# Patient Record
Sex: Male | Born: 1950
Health system: Southern US, Community
[De-identification: ages and names within clinical notes are randomized; demographics above are authoritative.]

## PROBLEM LIST (undated history)

## (undated) DIAGNOSIS — I2510 Atherosclerotic heart disease of native coronary artery without angina pectoris: Secondary | ICD-10-CM

## (undated) DIAGNOSIS — E039 Hypothyroidism, unspecified: Secondary | ICD-10-CM

## (undated) DIAGNOSIS — I241 Dressler's syndrome: Secondary | ICD-10-CM

## (undated) DIAGNOSIS — I10 Essential (primary) hypertension: Secondary | ICD-10-CM

## (undated) DIAGNOSIS — E785 Hyperlipidemia, unspecified: Secondary | ICD-10-CM

## (undated) DIAGNOSIS — I639 Cerebral infarction, unspecified: Secondary | ICD-10-CM

## (undated) HISTORY — DX: Atherosclerotic heart disease of native coronary artery without angina pectoris: I25.10

## (undated) HISTORY — DX: Hyperlipidemia, unspecified: E78.5

## (undated) HISTORY — DX: Dressler's syndrome: I24.1

## (undated) HISTORY — DX: Essential (primary) hypertension: I10

## (undated) HISTORY — PX: CORONARY ANGIOPLASTY WITH STENT PLACEMENT: SHX49

---

## 2002-07-21 ENCOUNTER — Emergency Department (HOSPITAL_COMMUNITY): Admission: EM | Admit: 2002-07-21 | Discharge: 2002-07-21 | Payer: Self-pay | Admitting: Emergency Medicine

## 2002-07-21 ENCOUNTER — Encounter: Payer: Self-pay | Admitting: Emergency Medicine

## 2009-03-15 DIAGNOSIS — I219 Acute myocardial infarction, unspecified: Secondary | ICD-10-CM

## 2009-03-15 HISTORY — DX: Acute myocardial infarction, unspecified: I21.9

## 2009-03-15 HISTORY — PX: CORONARY ANGIOPLASTY WITH STENT PLACEMENT: SHX49

## 2009-03-20 ENCOUNTER — Encounter: Payer: Self-pay | Admitting: Cardiology

## 2009-05-08 ENCOUNTER — Ambulatory Visit (HOSPITAL_COMMUNITY): Admission: RE | Admit: 2009-05-08 | Discharge: 2009-05-08 | Payer: Self-pay | Admitting: Urology

## 2009-08-29 ENCOUNTER — Inpatient Hospital Stay (HOSPITAL_COMMUNITY): Admission: EM | Admit: 2009-08-29 | Discharge: 2009-08-31 | Payer: Self-pay | Admitting: Cardiology

## 2009-08-29 ENCOUNTER — Ambulatory Visit: Payer: Self-pay | Admitting: Cardiology

## 2009-08-29 ENCOUNTER — Ambulatory Visit: Payer: Self-pay | Admitting: Cardiovascular Disease

## 2009-08-29 DIAGNOSIS — I241 Dressler's syndrome: Secondary | ICD-10-CM

## 2009-08-29 HISTORY — DX: Dressler's syndrome: I24.1

## 2009-08-30 ENCOUNTER — Encounter: Payer: Self-pay | Admitting: Cardiology

## 2009-09-09 ENCOUNTER — Encounter: Payer: Self-pay | Admitting: Cardiology

## 2009-09-29 ENCOUNTER — Encounter: Payer: Self-pay | Admitting: Cardiology

## 2009-09-30 ENCOUNTER — Encounter: Payer: Self-pay | Admitting: Cardiology

## 2009-10-02 DIAGNOSIS — E1159 Type 2 diabetes mellitus with other circulatory complications: Secondary | ICD-10-CM | POA: Insufficient documentation

## 2009-10-02 DIAGNOSIS — I10 Essential (primary) hypertension: Secondary | ICD-10-CM | POA: Insufficient documentation

## 2009-10-02 DIAGNOSIS — I2510 Atherosclerotic heart disease of native coronary artery without angina pectoris: Secondary | ICD-10-CM

## 2009-10-02 DIAGNOSIS — E785 Hyperlipidemia, unspecified: Secondary | ICD-10-CM

## 2009-10-02 DIAGNOSIS — R079 Chest pain, unspecified: Secondary | ICD-10-CM | POA: Insufficient documentation

## 2009-10-02 DIAGNOSIS — I214 Non-ST elevation (NSTEMI) myocardial infarction: Secondary | ICD-10-CM | POA: Insufficient documentation

## 2009-10-03 ENCOUNTER — Ambulatory Visit: Payer: Self-pay | Admitting: Cardiology

## 2009-10-03 ENCOUNTER — Encounter (INDEPENDENT_AMBULATORY_CARE_PROVIDER_SITE_OTHER): Payer: Self-pay | Admitting: *Deleted

## 2009-11-12 ENCOUNTER — Encounter: Payer: Self-pay | Admitting: Cardiology

## 2009-12-23 ENCOUNTER — Encounter: Payer: Self-pay | Admitting: Cardiology

## 2010-01-29 ENCOUNTER — Encounter: Payer: Self-pay | Admitting: Cardiology

## 2010-03-04 ENCOUNTER — Encounter: Payer: Self-pay | Admitting: Cardiology

## 2010-04-13 ENCOUNTER — Encounter: Payer: Self-pay | Admitting: Cardiology

## 2010-04-14 ENCOUNTER — Ambulatory Visit
Admission: RE | Admit: 2010-04-14 | Discharge: 2010-04-14 | Payer: Self-pay | Source: Home / Self Care | Attending: Cardiology | Admitting: Cardiology

## 2010-04-14 DIAGNOSIS — N529 Male erectile dysfunction, unspecified: Secondary | ICD-10-CM | POA: Insufficient documentation

## 2010-04-14 NOTE — Miscellaneous (Signed)
Summary: Rehab Report/ CARDIAC REHAB PROGRESS REPORT  Rehab Report/ CARDIAC REHAB PROGRESS REPORT   Imported By: Dorise Hiss 12/26/2009 12:24:21  _____________________________________________________________________  External Attachment:    Type:   Image     Comment:   External Document

## 2010-04-14 NOTE — Consult Note (Signed)
Summary: ER CARDIOLOGY CONSULT/ [facility]  ER CARDIOLOGY CONSULT/ [facility]   Imported By: Zachary George 10/02/2009 14:23:08  _____________________________________________________________________  External Attachment:    Type:   Image     Comment:   External Document

## 2010-04-14 NOTE — Miscellaneous (Signed)
Summary: Rehab Report/ CARDIAC REHAB PROGRESS REPORT  Rehab Report/ CARDIAC REHAB PROGRESS REPORT   Imported By: Dorise Hiss 11/13/2009 08:24:39  _____________________________________________________________________  External Attachment:    Type:   Image     Comment:   External Document

## 2010-04-14 NOTE — Miscellaneous (Signed)
Summary: Rehab Report/ CARDIAC REHAB PLAN OF CARE  Rehab Report/ CARDIAC [facility] PLAN OF CARE   Imported By: Dorise Hiss 10/02/2009 14:31:58  _____________________________________________________________________  External Attachment:    Type:   Image     Comment:   External Document

## 2010-04-14 NOTE — Miscellaneous (Signed)
Summary: Rehab Report/ FAXED CARDIAC REHAB REFERRAL  Rehab Report/ FAXED CARDIAC REHAB REFERRAL   Imported By: Dorise Hiss 09/09/2009 08:40:53  _____________________________________________________________________  External Attachment:    Type:   Image     Comment:   External Document

## 2010-04-14 NOTE — Assessment & Plan Note (Signed)
Summary: [facility] 6/19 3-4 WK   Visit Type:  hospital follow-up Primary Provider:  Youlanda Roys   History of Present Illness: patient is 60 year old male with a non-ST elevation elevation myocardial infarctioncurrent. The patient has multiple cardiac risk factors including diabetes mellitus hypertension and hypercholesterolemia. The patient underwent drug-eluting stent placement to the mid LAD. He has been doing well. He reports no chest pain or shortness of breath has had no orthopnea or PND. He has difficulty affording Crestor. He is also taking prasugrel. The patient is going to cardiac rehabilitation and is doing quite well.  Preventive Screening-Counseling & Management  Alcohol-Tobacco     Smoking Status: quit     Year Quit: 1990  Current Medications (verified): 1)  Aspir-Trin 325 Mg Tbec (Aspirin) .... Take 1 Tablet By Mouth Once A Day 2)  Glipizide Xl 10 Mg Xr24h-Tab (Glipizide) .... Take 1 Tablet By Mouth Two Times A Day 3)  Metoprolol Tartrate 25 Mg Tabs (Metoprolol Tartrate) .... Take 1 Tablet By Mouth Two Times A Day 4)  Nitrostat 0.4 Mg Subl (Nitroglycerin) .... Use As Directed 5)  Effient 10 Mg Tabs (Prasugrel Hcl) .... Take 1 Tablet By Mouth Once A Day 6)  Pravastatin Sodium 80 Mg Tabs (Pravastatin Sodium) .... Take 1 Tab By Mouth At Bedtime 7)  Amlodipine Besylate 10 Mg Tabs (Amlodipine Besylate) .... Take 1 Tablet By Mouth Once A Day 8)  Benazepril Hcl 40 Mg Tabs (Benazepril Hcl) .... Take 2 Tablet By Mouth Once A Day 9)  Lantus 100 Unit/ml Soln (Insulin Glargine) .... Use As Directed 10)  Metformin Hcl 1000 Mg Tabs (Metformin Hcl) .... Take 1 Tablet By Mouth Two Times A Day  Allergies (verified): No Known Drug Allergies  Comments:  Nurse/Medical Assistant: The patient's medication list and allergies were reviewed with the patient and were updated in the Medication and Allergy Lists.  Past History:  Past Medical History: Last updated:  10/02/2009 Insulin-dependent DM Hypertension Dyslipidemia Chest pains Post MI 08-29-2009 CAD  Family History: Last updated: 10/02/2009 Hypertension in family members  Social History: Last updated: 10/02/2009 Works at a print shop Alcohol Use - no Drug Use - no Married   Risk Factors: Smoking Status: quit (10/03/2009)  Social History: Smoking Status:  quit  Vital Signs:  Patient profile:   60 year old male Height:      74 inches Weight:      229 pounds BMI:     29.51 Pulse rate:   76 / minute BP sitting:   137 / 90  (left arm) Cuff size:   large  Vitals Entered By: Carlye Grippe (October 03, 2009 9:27 AM)  Nutrition Counseling: Patient's BMI is greater than 25 and therefore counseled on weight management options.  Physical Exam  Additional Exam:  General: Well-developed, well-nourished in no distress head: Normocephalic and atraumatic eyes PERRLA/EOMI intact, conjunctiva and lids normal nose: No deformity or lesions mouth normal dentition, normal posterior pharynx neck: Supple, no JVD.  No masses, thyromegaly or abnormal cervical nodes lungs: Normal breath sounds bilaterally without wheezing.  Normal percussion heart: regular rate and rhythm with normal S1 and S2, no S3 or S4.  PMI is normal.  No pathological murmurs abdomen: Normal bowel sounds, abdomen is soft and nontender without masses, organomegaly or hernias noted.  No hepatosplenomegaly musculoskeletal: Back normal, normal gait muscle strength and tone normal pulsus: Pulse is normal in all 4 extremities Extremities: No peripheral pitting edema neurologic: Alert and oriented x 3 skin: Intact  without lesions or rashes cervical nodes: No significant adenopathy psychologic: Normal affect    Impression & Recommendations:  Problem # 1:  CORONARY ATHEROSCLEROSIS NATIVE CORONARY ARTERY (ICD-414.01) no recurrent chest pain.. Continue medical therapy His updated medication list for this problem includes:     Aspir-trin 325 Mg Tbec (Aspirin) .Marland Kitchen... Take 1 tablet by mouth once a day    Metoprolol Tartrate 25 Mg Tabs (Metoprolol tartrate) .Marland Kitchen... Take 1 tablet by mouth two times a day    Nitrostat 0.4 Mg Subl (Nitroglycerin) ..... Use as directed    Effient 10 Mg Tabs (Prasugrel hcl) .Marland Kitchen... Take 1 tablet by mouth once a day    Amlodipine Besylate 10 Mg Tabs (Amlodipine besylate) .Marland Kitchen... Take 1 tablet by mouth once a day    Benazepril Hcl 40 Mg Tabs (Benazepril hcl) .Marland Kitchen... Take 2 tablet by mouth once a day  Problem # 2:  DYSLIPIDEMIA (ICD-272.4) we'll discontinue pressor and changed to high-dose pravastatin. His updated medication list for this problem includes:    Pravastatin Sodium 80 Mg Tabs (Pravastatin sodium) .Marland Kitchen... Take 1 tab by mouth at bedtime  Problem # 3:  HYPERTENSION (ICD-401.9) blood pressure well-controlled. His updated medication list for this problem includes:    Aspir-trin 325 Mg Tbec (Aspirin) .Marland Kitchen... Take 1 tablet by mouth once a day    Metoprolol Tartrate 25 Mg Tabs (Metoprolol tartrate) .Marland Kitchen... Take 1 tablet by mouth two times a day    Amlodipine Besylate 10 Mg Tabs (Amlodipine besylate) .Marland Kitchen... Take 1 tablet by mouth once a day    Benazepril Hcl 40 Mg Tabs (Benazepril hcl) .Marland Kitchen... Take 2 tablet by mouth once a day  Patient Instructions: 1)  Stop Crestor 2)  Begin Pravastatin 80mg  at bedtime  3)  Follow up in  6 months Prescriptions: PRAVASTATIN SODIUM 80 MG TABS (PRAVASTATIN SODIUM) Take 1 tab by mouth at bedtime  #30 x 6   Entered by:   Hoover Brunette, LPN   Authorized by:   Lewayne Bunting, MD, [facility]   Signed by:   Hoover Brunette, LPN on 16/12/9602   Method used:   [facility]  [address] [address] [id number]       [address]       [place], [place]  [address]       Ph: [phone] or [phone]       Fax: [phone]   RxID:   [id number]

## 2010-04-14 NOTE — Miscellaneous (Signed)
Summary: Rehab Report/ CARDIAC REHAB PROGRESS REPORT  Rehab Report/ CARDIAC REHAB PROGRESS REPORT   Imported By: Dorise Hiss 01/30/2010 15:29:58  _____________________________________________________________________  External Attachment:    Type:   Image     Comment:   External Document

## 2010-04-14 NOTE — Letter (Signed)
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 [address]   [place], [place] [address]   Phone: [phone]  Fax: [phone]    10/03/2009  TO: Leodis Sias IT MAY CONCERN   RE: CHUKWUKA FESTA [facility] [address] [id number]   The above named individual is under my medical care and may return to work on:  Monday, October 06, 2009.  If you have any further questions or need additional information, please call.     Sincerely,    Hoover Brunette, LPN

## 2010-04-14 NOTE — Miscellaneous (Signed)
Summary: Rehab Report/ CARDIAC REHAB PROGRESS REPORT  Rehab Report/ CARDIAC REHAB PROGRESS REPORT   Imported By: Dorise Hiss 09/30/2009 15:49:31  _____________________________________________________________________  External Attachment:    Type:   Image     Comment:   External Document

## 2010-04-16 NOTE — Miscellaneous (Signed)
Summary: Rehab Report/ CARDIAC REHAB PROGRESS REPORT  Rehab Report/ CARDIAC REHAB PROGRESS REPORT   Imported By: Dorise Hiss 03/04/2010 14:05:38  _____________________________________________________________________  External Attachment:    Type:   Image     Comment:   External Document

## 2010-04-22 NOTE — Miscellaneous (Signed)
Summary: Rehab Report/ CARDIAC REHAB DISCHARGE SUMMARY  Rehab Report/ CARDIAC REHAB DISCHARGE SUMMARY   Imported By: Dorise Hiss 04/14/2010 14:31:43  _____________________________________________________________________  External Attachment:    Type:   Image     Comment:   External Document

## 2010-04-30 NOTE — Assessment & Plan Note (Signed)
Summary: 6 MO FUL   Visit Type:  Follow-up Primary Provider:  Viviann Spare Burdine,MD   History of Present Illness: the patient is a 60 year old male with history of coronary artery disease status post prior stenting to the LAD.  The patient denies any chest pain or shortness of breath.  He is no orthopnea PND.from a cardiovascular perspective  he is doing quite well.  He is compliant with his medical regimen.  The patient is complaint of erectile dysfunction after he has been placed on beta-blocker therapy.  Is requesting some therapy.  Of note is also the patient's diastolic blood pressures elevated at 88 mm of mercury.  The patient has just finished cardiac rehab program.  He states that he will continue to do exercise as much as he can.  His lipid panels followed by his primary care physician.  The patient also has diabetes which is somewhat difficult to control and is both on insulin and oral hypoglycemics.  The patient remains at high risk for coronary artery disease and advised him to continue prasugrel at least for a year if not longer.  Preventive Screening-Counseling & Management  Alcohol-Tobacco     Smoking Status: quit     Year Quit: 1990  Current Medications (verified): 1)  Aspir-Trin 325 Mg Tbec (Aspirin) .... Take 1 Tablet By Mouth Once A Day 2)  Glipizide Xl 10 Mg Xr24h-Tab (Glipizide) .... Take 1 Tablet By Mouth Two Times A Day 3)  Metoprolol Tartrate 25 Mg Tabs (Metoprolol Tartrate) .... Take 1 Tablet By Mouth Two Times A Day 4)  Nitrostat 0.4 Mg Subl (Nitroglycerin) .... Use As Directed 5)  Effient 10 Mg Tabs (Prasugrel Hcl) .... Take 1 Tablet By Mouth Once A Day 6)  Pravastatin Sodium 80 Mg Tabs (Pravastatin Sodium) .... Take 1 Tab By Mouth At Bedtime 7)  Amlodipine Besylate 10 Mg Tabs (Amlodipine Besylate) .... Take 1 Tablet By Mouth Once A Day 8)  Benazepril Hcl 40 Mg Tabs (Benazepril Hcl) .... Take 2 Tablet By Mouth Once A Day 9)  Novolin 70/30 70-30 % Susp (Insulin  Isophane & Regular) .... Use As Directed 10)  Metformin Hcl 1000 Mg Tabs (Metformin Hcl) .... Take 1 Tablet By Mouth Two Times A Day 11)  Viagra 25 Mg Tabs (Sildenafil Citrate) .... May Take One Tab As Needed (Max One Per Day) Take 30-40 Minutes Prior To Intercourse, May Increase To 50mg  If Needed 12)  Chlorthalidone 25 Mg Tabs (Chlorthalidone) .... Take 1/2 Tab (12.5mg ) Daily  Allergies (verified): No Known Drug Allergies  Comments:  Nurse/Medical Assistant: The patient's medication list and allergies were reviewed with the patient and were updated in the Medication and Allergy Lists.  Past History:  Family History: Last updated: 10/02/2009 Hypertension in family members  Social History: Last updated: 10/02/2009 Works at a print shop Alcohol Use - no Drug Use - no Married   Risk Factors: Smoking Status: quit (04/14/2010)  Past Medical History: Insulin-dependent DM Hypertension Dyslipidemia Chest pains Post MI 08-29-2009 CAD, status post drug-eluting stent to the LAD  Review of Systems       The patient complains of leg swelling.  The patient denies fatigue, malaise, fever, weight gain/loss, vision loss, decreased hearing, hoarseness, chest pain, palpitations, shortness of breath, prolonged cough, wheezing, sleep apnea, coughing up blood, abdominal pain, blood in stool, nausea, vomiting, diarrhea, heartburn, incontinence, blood in urine, muscle weakness, joint pain, rash, skin lesions, headache, fainting, dizziness, depression, anxiety, enlarged lymph nodes, easy bruising or bleeding, and environmental  allergies.         erectile dysfunction  Vital Signs:  Patient profile:   60 year old male Height:      74 inches Weight:      236 pounds Pulse rate:   85 / minute BP sitting:   138 / 88  (left arm) Cuff size:   large  Vitals Entered By: Carlye Grippe (April 14, 2010 9:35 AM)  Physical Exam  Additional Exam:  General: Well-developed, well-nourished in no  distress head: Normocephalic and atraumatic eyes PERRLA/EOMI intact, conjunctiva and lids normal nose: No deformity or lesions mouth normal dentition, normal posterior pharynx neck: Supple, no JVD.  No masses, thyromegaly or abnormal cervical nodes lungs: Normal breath sounds bilaterally without wheezing.  Normal percussion heart: regular rate and rhythm with normal S1 and S2, no S3 or S4.  PMI is normal.  No pathological murmurs abdomen: Normal bowel sounds, abdomen is soft and nontender without masses, organomegaly or hernias noted.  No hepatosplenomegaly musculoskeletal: Back normal, normal gait muscle strength and tone normal pulsus: Pulse is normal in all 4 extremities Extremities: 1+ peripheral pitting edema neurologic: Alert and oriented x 3 skin: Intact without lesions or rashes cervical nodes: No significant adenopathy psychologic: Normal affect    Impression & Recommendations:  Problem # 1:  CORONARY ATHEROSCLEROSIS NATIVE CORONARY ARTERY (ICD-414.01) the patient is status post stent placement to the LAD.  He has no recurrent chest pain.  Continue risk factor modification.  The patient will remain on prasugrel for at least a year if not longer.  He has no bleeding complications. His updated medication list for this problem includes:    Aspir-trin 325 Mg Tbec (Aspirin) .Marland Kitchen... Take 1 tablet by mouth once a day    Metoprolol Tartrate 25 Mg Tabs (Metoprolol tartrate) .Marland Kitchen... Take 1 tablet by mouth two times a day    Nitrostat 0.4 Mg Subl (Nitroglycerin) ..... Use as directed    Effient 10 Mg Tabs (Prasugrel hcl) .Marland Kitchen... Take 1 tablet by mouth once a day    Amlodipine Besylate 10 Mg Tabs (Amlodipine besylate) .Marland Kitchen... Take 1 tablet by mouth once a day    Benazepril Hcl 40 Mg Tabs (Benazepril hcl) .Marland Kitchen... Take 2 tablet by mouth once a day  Problem # 2:  HYPERTENSION (ICD-401.9) diastolic blood pressure appears poorly controlled.  He also has lower joint edema I have added for Tylenol 12.5  monos p.o. daily His updated medication list for this problem includes:    Aspir-trin 325 Mg Tbec (Aspirin) .Marland Kitchen... Take 1 tablet by mouth once a day    Metoprolol Tartrate 25 Mg Tabs (Metoprolol tartrate) .Marland Kitchen... Take 1 tablet by mouth two times a day    Amlodipine Besylate 10 Mg Tabs (Amlodipine besylate) .Marland Kitchen... Take 1 tablet by mouth once a day    Benazepril Hcl 40 Mg Tabs (Benazepril hcl) .Marland Kitchen... Take 2 tablet by mouth once a day    Chlorthalidone 25 Mg Tabs (Chlorthalidone) .Marland Kitchen... Take 1/2 tab (12.5mg ) daily  Problem # 3:  DYSLIPIDEMIA (ICD-272.4) followed by his primary care physician and the patient is now on high-dose statin.  Reportedly his LDL level is within goal His updated medication list for this problem includes:    Pravastatin Sodium 80 Mg Tabs (Pravastatin sodium) .Marland Kitchen... Take 1 tab by mouth at bedtime  Problem # 4:  DM (ICD-250.00) Assessment: Comment Only  His updated medication list for this problem includes:    Aspir-trin 325 Mg Tbec (Aspirin) .Marland Kitchen... Take 1 tablet by  mouth once a day    Glipizide Xl 10 Mg Xr24h-tab (Glipizide) .Marland Kitchen... Take 1 tablet by mouth two times a day    Benazepril Hcl 40 Mg Tabs (Benazepril hcl) .Marland Kitchen... Take 2 tablet by mouth once a day    Novolin 70/30 70-30 % Susp (Insulin isophane & regular) ..... Use as directed    Metformin Hcl 1000 Mg Tabs (Metformin hcl) .Marland Kitchen... Take 1 tablet by mouth two times a day  Problem # 5:  ERECTILE DYSFUNCTION, SECONDARY TO MEDICATION (WNU-272.53) likely erectile dysfunction secondary to metoprolol.  I told the patient of the benefit of the Toprol in reducing his future risk for recurrent myocardial infarction.  We discussed the possible benefits of Viagra and have given the patient prescription.  I started him clearly not to use this around the use of nitroglycerin.  Patient Instructions: 1)  Viagra 25mg  (max one per day) - 30 to 40 minutes prior to intercourse.  May increase to 50mg  x 1 if needed  2)  No Nitroglycerin when  using Viagra  3)  Chlorthalidone 12.5mg  daily 4)  Follow up in  6 months Prescriptions: CHLORTHALIDONE 25 MG TABS (CHLORTHALIDONE) take 1/2 tab (12.5mg ) daily  #15 x 6   Entered by:   Hoover Brunette, LPN   Authorized by:   Lewayne Bunting, MD, [facility]   Signed by:   Hoover Brunette, LPN on 66/44/0347   Method used:   [facility]  [address] [facility]       [address]       [place], [place]  [address]       Ph: [phone] or [phone]       Fax: [phone]   RxID:   [id number]   Handout requested. VIAGRA 25 MG TABS (SILDENAFIL CITRATE) may take one tab as needed (max one per day) Take 30-40 minutes prior to intercourse, may increase to 50mg  if needed  #30 x 1   Entered by:   Hoover Brunette, LPN   Authorized by:   Lewayne Bunting, MD, [facility]   Signed by:   Hoover Brunette, LPN on 23/76/2831   Method used:   [facility]  [address] [facility]       [address]       [place], [place]  [address]       Ph: [phone] or [phone]       Fax: [phone]   RxID:   [id number]

## 2010-05-31 LAB — BASIC METABOLIC PANEL
BUN: 10 mg/dL (ref 6–23)
CO2: 27 mEq/L (ref 19–32)
Calcium: 8.4 mg/dL (ref 8.4–10.5)
Creatinine, Ser: 0.95 mg/dL (ref 0.4–1.5)
Glucose, Bld: 227 mg/dL — ABNORMAL HIGH (ref 70–99)

## 2010-05-31 LAB — GLUCOSE, CAPILLARY
Glucose-Capillary: 203 mg/dL — ABNORMAL HIGH (ref 70–99)
Glucose-Capillary: 215 mg/dL — ABNORMAL HIGH (ref 70–99)
Glucose-Capillary: 251 mg/dL — ABNORMAL HIGH (ref 70–99)
Glucose-Capillary: 254 mg/dL — ABNORMAL HIGH (ref 70–99)
Glucose-Capillary: 266 mg/dL — ABNORMAL HIGH (ref 70–99)
Glucose-Capillary: 316 mg/dL — ABNORMAL HIGH (ref 70–99)

## 2010-05-31 LAB — CBC
MCHC: 32.9 g/dL (ref 30.0–36.0)
RDW: 13.3 % (ref 11.5–15.5)

## 2010-07-31 NOTE — Consult Note (Signed)
NAMERICHRD, KUZNIAR NO.:  [id number]   MEDICAL RECORD NO.:  [id number]                   PATIENT TYPE:  EMS   LOCATION:  ED                                   FACILITY:  [facility]   PHYSICIAN:  Artist Pais. Mina Marble, M.D.           DATE OF BIRTH:  [birth year]   DATE OF CONSULTATION:  DATE OF DISCHARGE:                                   CONSULTATION   PHYSICIAN REQUESTING CONSULTATION:  Dr. Donnetta Hutching.   REASON FOR CONSULTATION:  This is a very pleasant 60 year old right hand  dominant male who got his long finger caught in a press while at work today  and presents today with an obvious open fracture of the distal phalanx -  right long finger with nail plate fracture, nailbed laceration, etcetera.  He is 60 years old.   ALLERGIES:  He has no known drug allergies.   MEDICATIONS:  He is currently on oral antihyperglycemics for non-insulin-  dependent diabetes mellitus.   PAST MEDICAL HISTORY:  No other significant  past medical history.   SOCIAL HISTORY:  Noncontributory.   FAMILY HISTORY:  Noncontributory.   PHYSICAL EXAMINATION:  GENERAL:  A well-developed, well-nourished male,  pleasant, alert and oriented  x 3.  EXTREMITIES:  Examination reveals an obvious open injury to the right long  finger with a nail plate fracture, open distal phalanx fracture, nailbed  laceration.  He has sensation distally to the tip.   DIAGNOSTIC STUDIES:  X-rays show a transverse fracture with some mild  displacement to the distal third of the distal phalanx.   PROCEDURE:  The patient was given a 2% plain lidocaine digital sheath block,  once adequate anesthesia was obtained he was prepped and draped in he usual  sterile fashion.  His wound was thoroughly irrigated and debrided of clot  and nonviable material.  His skin was then sutured with 4-0 Nylon to realign  the tip and the nailbed was repaired with 6-0 Chromic.  He was then placed  in a sterile dressing  Xeroform, 4 x 4s, Coban wrap and a volar splint.   DISPOSITION:  Patient was discharged from the emergency department with  Spectracef 200 mg one p.o. b.i.d. for ten days for antibiotic prophylaxis.  Vicodin #20 for pain.  He is to followup in my office in 5-7 days.  I also  told him that because of the transverse nature of this fracture, this might  require pinning at one point in the future if it does not maintain its  alignment but grossly it looked aligned in the emergency room.                                                Artist Pais Mina Marble, M.D.   MAW/MEDQ  D:  07/21/2002  T:  07/21/2002  Job:  [id number]

## 2010-09-10 ENCOUNTER — Encounter: Payer: Self-pay | Admitting: Cardiology

## 2010-10-08 ENCOUNTER — Other Ambulatory Visit (HOSPITAL_COMMUNITY): Payer: Self-pay | Admitting: Urology

## 2010-10-08 ENCOUNTER — Ambulatory Visit (HOSPITAL_COMMUNITY)
Admission: RE | Admit: 2010-10-08 | Discharge: 2010-10-08 | Disposition: A | Discharge status: Home / Self Care | Payer: BC Managed Care – PPO | Source: Ambulatory Visit | Attending: Urology | Admitting: Urology

## 2010-10-08 DIAGNOSIS — N135 Crossing vessel and stricture of ureter without hydronephrosis: Secondary | ICD-10-CM

## 2010-11-10 ENCOUNTER — Telehealth: Payer: Self-pay | Admitting: Cardiology

## 2010-11-10 NOTE — Telephone Encounter (Signed)
Alex Cortez called wanting to know when he is suppose to come off of the Effient 10mg . States that he has been on it For one year and he thought Dr Andee Lineman had said to take for one year.

## 2010-11-11 NOTE — Telephone Encounter (Signed)
Message left on patient answering machine that we will send message to Dr. Andee Lineman for clarification.  Per last OV dictation (04-14-10), patient will remain on Prasugrel for at least a year if not longer.

## 2010-11-11 NOTE — Telephone Encounter (Signed)
Patient can discontinue prasurgrel a stent longer than one year which I think is the case. He is to continue full dose aspirin 325 minutes. Daily

## 2010-11-13 ENCOUNTER — Other Ambulatory Visit: Payer: Self-pay | Admitting: *Deleted

## 2010-11-13 MED ORDER — METOPROLOL TARTRATE 25 MG PO TABS: 25.0000 mg | ORAL_TABLET | Freq: Two times a day (BID) | ORAL | Status: DC

## 2010-11-13 NOTE — Telephone Encounter (Signed)
Left message to return call 

## 2010-11-17 NOTE — Telephone Encounter (Signed)
Patient notified via answering machine.   

## 2011-01-14 ENCOUNTER — Other Ambulatory Visit: Payer: Self-pay | Admitting: *Deleted

## 2011-01-14 MED ORDER — METOPROLOL TARTRATE 25 MG PO TABS: 25.0000 mg | ORAL_TABLET | Freq: Two times a day (BID) | ORAL | Status: DC

## 2011-02-16 ENCOUNTER — Other Ambulatory Visit: Payer: Self-pay | Admitting: *Deleted

## 2011-02-16 MED ORDER — METOPROLOL TARTRATE 25 MG PO TABS: 25.0000 mg | ORAL_TABLET | Freq: Two times a day (BID) | ORAL | Status: DC

## 2011-03-20 ENCOUNTER — Other Ambulatory Visit: Payer: Self-pay | Admitting: Cardiology

## 2011-04-08 ENCOUNTER — Other Ambulatory Visit: Payer: Self-pay | Admitting: *Deleted

## 2011-04-08 MED ORDER — METOPROLOL TARTRATE 25 MG PO TABS: 25.0000 mg | ORAL_TABLET | Freq: Two times a day (BID) | ORAL | Status: DC

## 2011-04-22 ENCOUNTER — Other Ambulatory Visit: Payer: Self-pay | Admitting: Cardiology

## 2011-05-05 ENCOUNTER — Other Ambulatory Visit: Payer: Self-pay | Admitting: Cardiology

## 2011-08-25 ENCOUNTER — Encounter: Payer: Self-pay | Admitting: Cardiology

## 2011-08-25 DIAGNOSIS — I2510 Atherosclerotic heart disease of native coronary artery without angina pectoris: Secondary | ICD-10-CM

## 2011-08-30 ENCOUNTER — Ambulatory Visit (INDEPENDENT_AMBULATORY_CARE_PROVIDER_SITE_OTHER): Payer: Federal, State, Local not specified - PPO | Admitting: Cardiology

## 2011-08-30 ENCOUNTER — Encounter: Payer: Self-pay | Admitting: *Deleted

## 2011-08-30 ENCOUNTER — Encounter: Payer: Self-pay | Admitting: Cardiology

## 2011-08-30 ENCOUNTER — Telehealth: Payer: Self-pay

## 2011-08-30 VITALS — BP 158/92 | HR 98 | Ht 74.0 in | Wt 253.0 lb

## 2011-08-30 DIAGNOSIS — G4733 Obstructive sleep apnea (adult) (pediatric): Secondary | ICD-10-CM

## 2011-08-30 DIAGNOSIS — N529 Male erectile dysfunction, unspecified: Secondary | ICD-10-CM

## 2011-08-30 DIAGNOSIS — R072 Precordial pain: Secondary | ICD-10-CM

## 2011-08-30 DIAGNOSIS — R0609 Other forms of dyspnea: Secondary | ICD-10-CM

## 2011-08-30 DIAGNOSIS — R079 Chest pain, unspecified: Secondary | ICD-10-CM

## 2011-08-30 DIAGNOSIS — Z01810 Encounter for preprocedural cardiovascular examination: Secondary | ICD-10-CM

## 2011-08-30 DIAGNOSIS — I519 Heart disease, unspecified: Secondary | ICD-10-CM

## 2011-08-30 DIAGNOSIS — R0600 Dyspnea, unspecified: Secondary | ICD-10-CM

## 2011-08-30 DIAGNOSIS — I214 Non-ST elevation (NSTEMI) myocardial infarction: Secondary | ICD-10-CM

## 2011-08-30 DIAGNOSIS — I2510 Atherosclerotic heart disease of native coronary artery without angina pectoris: Secondary | ICD-10-CM

## 2011-08-30 DIAGNOSIS — R0602 Shortness of breath: Secondary | ICD-10-CM

## 2011-08-30 LAB — PROTIME-INR

## 2011-08-30 MED ORDER — CARVEDILOL 3.125 MG PO TABS: 3.1250 mg | ORAL_TABLET | Freq: Two times a day (BID) | ORAL | Status: DC

## 2011-08-30 MED ORDER — ISOSORBIDE MONONITRATE ER 30 MG PO TB24: 30.0000 mg | ORAL_TABLET | Freq: Every day | ORAL | Status: DC

## 2011-08-30 MED ORDER — FUROSEMIDE 40 MG PO TABS: 40.0000 mg | ORAL_TABLET | Freq: Every day | ORAL | Status: DC

## 2011-08-30 MED ORDER — NITROGLYCERIN 0.4 MG SL SUBL: 0.4000 mg | SUBLINGUAL_TABLET | SUBLINGUAL | Status: DC | PRN

## 2011-08-30 NOTE — Patient Instructions (Addendum)
   Left & right heart cath   Begin Coreg 3.125mg  twice a day    Begin Imdur 30mg  daily  Begin Lasix 40mg  daily  Begin Nitroglycerin SL as needed for severe chest pain only  Referral to Dr. Josephina Shih  Follow up will be given after procedure

## 2011-08-30 NOTE — Telephone Encounter (Signed)
Left & right heart cath  Friday 6/21 Dr. Elease Hashimoto Checking percert

## 2011-08-30 NOTE — Telephone Encounter (Signed)
No precert required for [facility]  /AW

## 2011-08-31 ENCOUNTER — Encounter: Payer: Self-pay | Admitting: Cardiology

## 2011-09-02 ENCOUNTER — Other Ambulatory Visit: Payer: Self-pay | Admitting: Cardiology

## 2011-09-02 DIAGNOSIS — I519 Heart disease, unspecified: Secondary | ICD-10-CM | POA: Insufficient documentation

## 2011-09-02 DIAGNOSIS — I50810 Right heart failure, unspecified: Secondary | ICD-10-CM

## 2011-09-02 DIAGNOSIS — G4733 Obstructive sleep apnea (adult) (pediatric): Secondary | ICD-10-CM | POA: Insufficient documentation

## 2011-09-02 NOTE — Assessment & Plan Note (Signed)
Status post prior myocardial infarction and stent to the LAD. The patient was previously on prasugrel. Currently not on dual antiplatelet therapy anymore.

## 2011-09-02 NOTE — H&P (Signed)
Alex Bottoms, MD 09/02/2011 6:53 PM Signed  Alex Bottoms, MD, [facility]  ABIM Board Certified in Adult Cardiovascular Medicine,Internal Medicine and Critical Care Medicine  CC: New-onset congestive heart failure with dyspnea and abnormal Myoview study  HPI:  The patient is 61 year old male with a prior history of non-ST elevation microinfarction and stent to the LAD.  The patient has not been seen in a while in his office. Over the last 8 months or so the patient has developed increased shortness of breath on exertion. This has been associated with significant weight gain of approximately 20 pounds over that period  Earlier in the year the patient was at the beach and experienced several episodes of dyspnea on minimal exertion with diaphoresis. In the course of this last 8 months is also sinus primary care physician who initiated workup. Chest x-ray and EKG were done and were felt to be within normal limits. Recently the patient was set up for a stress test which was abnormal with evidence of ischemia involving the anterolateral wall as well as a scar in the inferior apical wall.  The patient also has noticed increased edema in the lower extremities. Particularly over the last several weeks he has dyspnea on minimal exertion. One flight of stairs is difficult for him. He denies any substernal chest pain with the patient is a diabetic. Previously also had variable chest pain associated with his non-ST elevation myocardial infarction.  At present blood pressure is also poorly controlled. According to the patient's family the patient snores a lot at night with apneic episodes. He has morning headaches and falls asleep easily during the daytime. He has not had a prior workup for obstructive sleep apnea.  The patient also reports orthopnea and PND. He denies any palpitations presyncope or syncope.  PMH: reviewed and listed in Problem List in Electronic Records (and see below)  Past Medical History   Diagnosis   Date   .  Diabetes mellitus      insulin-dependent   .  HTN (hypertension)    .  Dyslipidemia    .  Chest pain    .  Post MI syndrome  08-29-2009   .  CAD (coronary artery disease)      status post drug eluting stent to the LAd    No past surgical history on file.  Allergies/SH/FHX : available in Electronic Records for review  No Known Allergies  History    Social History   .  Marital Status:  Married     Spouse Name:  N/A     Number of Children:  N/A   .  Years of Education:  N/A    Occupational History   .  Not on file.    Social History Main Topics   .  Smoking status:  Former Smoker -- 0.5 packs/day for 23 years     Types:  Cigarettes     Quit date:  03/15/1988   .  Smokeless tobacco:  Never Used   .  Alcohol Use:  No   .  Drug Use:  No   .  Sexually Active:  Not on file    Other Topics  Concern   .  Not on file    Social History Narrative    Works at [employer]. Married.    Family History   Problem  Relation  Age of Onset   .  Hypertension  Other       in all family members  Medications:  Current Outpatient Prescriptions   Medication  Sig  Dispense  Refill   .  amLODipine (NORVASC) 10 MG tablet  Take 10 mg by mouth daily.     Marland Kitchen  aspirin (ASPIR-TRIN) 325 MG EC tablet  Take 325 mg by mouth daily.     .  benazepril (LOTENSIN) 40 MG tablet  Take 40 mg by mouth daily.     Marland Kitchen  glipiZIDE (GLIPIZIDE XL) 10 MG 24 hr tablet  Take 10 mg by mouth 2 (two) times daily.     .  insulin NPH-insulin regular (NOVOLIN 70/30) (70-30) 100 UNIT/ML injection  Inject into the skin as directed.     .  metFORMIN (GLUMETZA) 1000 MG (MOD) 24 hr tablet  Take 1,000 mg by mouth 2 (two) times daily.     .  nitroGLYCERIN (NITROSTAT) 0.4 MG SL tablet  Place 1 tablet (0.4 mg total) under the tongue every 5 (five) minutes as needed.  25 tablet  3   .  pravastatin (PRAVACHOL) 80 MG tablet  Take 80 mg by mouth at bedtime.     .  carvedilol (COREG) 3.125 MG tablet  Take 1 tablet (3.125 mg total)  by mouth 2 (two) times daily.  60 tablet  6   .  furosemide (LASIX) 40 MG tablet  Take 1 tablet (40 mg total) by mouth daily.  30 tablet  6   .  isosorbide mononitrate (IMDUR) 30 MG 24 hr tablet  Take 1 tablet (30 mg total) by mouth daily.  30 tablet  6    ROS: No nausea or vomiting. No fever or chills.No melena or hematochezia.No bleeding.No claudication  Physical Exam:  BP 158/92  Pulse 98  Ht 6\' 2"  (1.88 m)  Wt 253 lb (114.76 kg)  BMI 32.48 kg/m2  SpO2 97%  General: Overweight African American male with mild shortness of breath.  HEENT: Pupils isocoric conjunctiva clear. Oropharynx clear  Neck: Normal carotid upstroke no carotid bruits. No thyromegaly nonnodular thyroid. JVP difficult to evaluate but appears to be around 9-10 cm  Lungs: Diminished breath sounds with few crackles at the bases.  Cardiac: Regular rate and rhythm with normal S1-S2, S4 present no definite S3 and no pathological murmurs  Vascular: 3+ peripheral pitting edema bilaterally. Normal distal pulses including dorsalis pedis and posterior tibial pulses  Skin: Warm and dry  Physcologic: Normal affect  Neurologic: Alert and oriented and grossly nonfocal  12lead ECG:  Limited bedside ECHO:N/A  No images are attached to the encounter.  Assessment and Plan  CORONARY ATHEROSCLEROSIS NATIVE CORONARY ARTERY  Status post prior stent to the LAD. Now patient presents with new-onset heart failure as well as an abnormal Myoview study. Findings consistent with scar in the inferior apical wall and ischemia involving the anterolateral wall. Post stress LVF was 39%. The patient also reports significant shortness of breath on minimal exertion. Although he has very little chest pain he is a diabetic. Given his clinical presentation and abnormal Myoview study I recommended a cardiac catheterization.  In the interim I start the patient on carvedilol 3.125 mg by mouth twice a day, isosorbide mononitrate 30 mg by mouth daily, Lasix 40 mg by  mouth daily and sublingual nitroglycerin when necessary.  The patient has been scheduled for an outpatient left and right heart catheterization.  I discussed the risks and benefits of a diagnostic cardiac catheterization with the patient.  We also discussed the radial versus femoral approach. In particular I quoted that the  risk of bleeding from the radial artery is usually less than 1%. I also explained however that the procedure is more challenging for the cardiologist and does involve a slightly greater radiation exposure although scientist estimate that increased radiation is equivalent to 20 chest x-rays representing only a small risk of the patient.  The following general risks were quoted to the patient for a diagnostic cardiac catheterization:  Risk of right heart catheterization was also discussed with the patient.  Obstructive sleep apnea  No formal diagnosis yet but the patient has high likelihood of OSA with high Epsworth score. We referred the patient to Dr. Andrey Campanile for evaluation of obstructive sleep apnea and possible treatment.  Dyspnea  Patient has marked dyspnea on exertion with minimal effort. He has symptoms of volume overload and heart failure. He is now found also to have LV dysfunction. Lasix has been initiated as outlined below.  CHEST PAIN  No definite chest pain the patient is diabetic. Suspect shortness of breath is angina equivalent.  ACUT MI SUBENDOCARDIAL INFARCT INIT EPIS CARE  Status post prior myocardial infarction and stent to the LAD. The patient was previously on prasugrel. Currently not on dual antiplatelet therapy anymore.  ERECTILE DYSFUNCTION, SECONDARY TO MEDICATION  Beta blocker is contributing to erectile dysfunction, but nevertheless have initiated low dose carvedilol because of high suspicion of ischemia and now also LV dysfunction.  LV dysfunction  Presentation of new onset heart failure both with left and right heart failure symptoms.  Patient Active  Problem List   Diagnosis   .  DM   .  DYSLIPIDEMIA   .  HYPERTENSION   .  ACUT MI SUBENDOCARDIAL INFARCT INIT EPIS CARE   .  CORONARY ATHEROSCLEROSIS NATIVE CORONARY ARTERY   .  CHEST PAIN   .  ERECTILE DYSFUNCTION, SECONDARY TO MEDICATION   .  Obstructive sleep apnea   .  Dyspnea   .  LV dysfunction    Alvin Critchley [facility] 09/02/2011 6:59 PM

## 2011-09-02 NOTE — Assessment & Plan Note (Signed)
No formal diagnosis yet but the patient has high likelihood of OSA with high Epsworth score. We referred the patient to Dr. Andrey Campanile for evaluation of obstructive sleep apnea and possible treatment.

## 2011-09-02 NOTE — Assessment & Plan Note (Signed)
No definite chest pain the patient is diabetic. Suspect shortness of breath is angina equivalent.

## 2011-09-02 NOTE — Assessment & Plan Note (Signed)
Beta blocker is contributing to erectile dysfunction, but nevertheless have initiated low dose carvedilol because of high suspicion of ischemia and now also LV dysfunction.

## 2011-09-02 NOTE — Assessment & Plan Note (Signed)
Status post prior stent to the LAD. Now patient presents with new-onset heart failure as well as an abnormal Myoview study. Findings consistent with scar in the inferior apical wall and ischemia involving the anterolateral wall. Post stress LVF was 39%. The patient also reports significant shortness of breath on minimal exertion. Although he has very little chest pain he is a diabetic. Given his clinical presentation and abnormal Myoview study I recommended a cardiac catheterization. In the interim I start the patient on carvedilol 3.125 mg by mouth twice a day, isosorbide mononitrate 30 mg by mouth daily, Lasix 40 mg by mouth daily and sublingual nitroglycerin when necessary. The patient has been scheduled for an outpatient left and right heart catheterization.

## 2011-09-02 NOTE — Assessment & Plan Note (Signed)
Presentation of new onset heart failure both with left and right heart failure symptoms.

## 2011-09-02 NOTE — Progress Notes (Signed)
Alex Bottoms, MD, [facility] ABIM Board Certified in Adult Cardiovascular Medicine,Internal Medicine and Critical Care Medicine    CC: New-onset congestive heart failure with dyspnea and abnormal Myoview study  HPI:  The patient is 61 year old male with a prior history of non-ST elevation microinfarction and stent to the LAD. The patient has not been seen in a while in his office. Over the last 8 months or so the patient has developed increased shortness of breath on exertion. This has been associated with significant weight gain of approximately 20 pounds over that period Earlier in the year the patient was at the beach and experienced several episodes of dyspnea on minimal exertion with diaphoresis. In the course of this last 8 months is also sinus primary care physician who initiated workup. Chest x-ray and EKG were done and were felt to be within normal limits. Recently the patient was set up for a stress test which was abnormal with evidence of ischemia involving the anterolateral wall as well as a scar in the inferior apical wall. The patient also has noticed increased edema in the lower extremities. Particularly over the last several weeks he has dyspnea on minimal exertion. One flight of stairs is difficult for him. He denies any substernal chest pain with the patient is a diabetic. Previously also had variable chest pain associated with his non-ST elevation myocardial infarction. At present blood pressure is also poorly controlled. According to the patient's family the patient snores a lot at night with apneic episodes. He has morning headaches and falls asleep easily during the daytime. He has not had a prior workup for obstructive sleep apnea. The patient also reports orthopnea and PND. He denies any palpitations presyncope or syncope.  PMH: reviewed and listed in Problem List in Electronic Records (and see below) Past Medical History  Diagnosis Date  . Diabetes mellitus    insulin-dependent  . HTN (hypertension)   . Dyslipidemia   . Chest pain   . Post MI syndrome 08-29-2009  . CAD (coronary artery disease)     status post drug eluting stent to the LAd   No past surgical history on file.  Allergies/SH/FHX : available in Electronic Records for review  No Known Allergies History   Social History  . Marital Status: Married    Spouse Name: N/A    Number of Children: N/A  . Years of Education: N/A   Occupational History  . Not on file.   Social History Main Topics  . Smoking status: Former Smoker -- 0.5 packs/day for 23 years    Types: Cigarettes    Quit date: 03/15/1988  . Smokeless tobacco: Never Used  . Alcohol Use: No  . Drug Use: No  . Sexually Active: Not on file   Other Topics Concern  . Not on file   Social History Narrative   Works at [employer]. Married.    Family History  Problem Relation Age of Onset  . Hypertension Other     in all family members    Medications: Current Outpatient Prescriptions  Medication Sig Dispense Refill  . amLODipine (NORVASC) 10 MG tablet Take 10 mg by mouth daily.        Marland Kitchen aspirin (ASPIR-TRIN) 325 MG EC tablet Take 325 mg by mouth daily.        . benazepril (LOTENSIN) 40 MG tablet Take 40 mg by mouth daily.        Marland Kitchen glipiZIDE (GLIPIZIDE XL) 10 MG 24 hr tablet Take 10 mg  by mouth 2 (two) times daily.        . insulin NPH-insulin regular (NOVOLIN 70/30) (70-30) 100 UNIT/ML injection Inject into the skin as directed.        . metFORMIN (GLUMETZA) 1000 MG (MOD) 24 hr tablet Take 1,000 mg by mouth 2 (two) times daily.        . nitroGLYCERIN (NITROSTAT) 0.4 MG SL tablet Place 1 tablet (0.4 mg total) under the tongue every 5 (five) minutes as needed.  25 tablet  3  . pravastatin (PRAVACHOL) 80 MG tablet Take 80 mg by mouth at bedtime.        . carvedilol (COREG) 3.125 MG tablet Take 1 tablet (3.125 mg total) by mouth 2 (two) times daily.  60 tablet  6  . furosemide (LASIX) 40 MG tablet Take 1 tablet (40  mg total) by mouth daily.  30 tablet  6  . isosorbide mononitrate (IMDUR) 30 MG 24 hr tablet Take 1 tablet (30 mg total) by mouth daily.  30 tablet  6    ROS: No nausea or vomiting. No fever or chills.No melena or hematochezia.No bleeding.No claudication  Physical Exam: BP 158/92  Pulse 98  Ht 6\' 2"  (1.88 m)  Wt 253 lb (114.76 kg)  BMI 32.48 kg/m2  SpO2 97% General: Overweight African American male with mild shortness of breath.  HEENT: Pupils isocoric conjunctiva clear. Oropharynx clear Neck: Normal carotid upstroke no carotid bruits. No thyromegaly nonnodular thyroid. JVP difficult to evaluate but appears to be around 9-10 cm Lungs: Diminished breath sounds with few crackles at the bases. Cardiac: Regular rate and rhythm with normal S1-S2, S4 present no definite S3 and no pathological murmurs Vascular: 3+ peripheral pitting edema bilaterally. Normal distal pulses including dorsalis pedis and posterior tibial pulses Skin: Warm and dry Physcologic: Normal affect Neurologic: Alert and oriented and grossly nonfocal  12lead ECG: Limited bedside ECHO:N/A No images are attached to the encounter.   Assessment and Plan  CORONARY ATHEROSCLEROSIS NATIVE CORONARY ARTERY Status post prior stent to the LAD. Now patient presents with new-onset heart failure as well as an abnormal Myoview study. Findings consistent with scar in the inferior apical wall and ischemia involving the anterolateral wall. Post stress LVF was 39%. The patient also reports significant shortness of breath on minimal exertion. Although he has very little chest pain he is a diabetic. Given his clinical presentation and abnormal Myoview study I recommended a cardiac catheterization. In the interim I start the patient on carvedilol 3.125 mg by mouth twice a day, isosorbide mononitrate 30 mg by mouth daily, Lasix 40 mg by mouth daily and sublingual nitroglycerin when necessary. The patient has been scheduled for an outpatient  left and right heart catheterization. I discussed the risks and benefits of a diagnostic cardiac catheterization with the patient.   We also discussed the radial versus femoral approach.  In particular I quoted that the risk of bleeding from the radial artery is usually less than 1%.  I also explained however that the procedure is more challenging for the cardiologist and does involve a slightly greater radiation exposure although scientist estimate that increased radiation is equivalent to 20 chest x-rays representing only a small risk of the patient.   The following general risks were quoted to the patient for a diagnostic cardiac catheterization:    Risk of right heart catheterization was also discussed with the patient.  Obstructive sleep apnea No formal diagnosis yet but the patient has high likelihood of OSA with high  Epsworth score. We referred the patient to Dr. Andrey Campanile for evaluation of obstructive sleep apnea and possible treatment.  Dyspnea Patient has marked dyspnea on exertion with minimal effort. He has symptoms of volume overload and heart failure. He is now found also to have LV dysfunction. Lasix has been initiated as outlined below.  CHEST PAIN No definite chest pain the patient is diabetic. Suspect shortness of breath is angina equivalent.  ACUT MI SUBENDOCARDIAL INFARCT INIT EPIS CARE Status post prior myocardial infarction and stent to the LAD. The patient was previously on prasugrel. Currently not on dual antiplatelet therapy anymore.  ERECTILE DYSFUNCTION, SECONDARY TO MEDICATION Beta blocker is contributing to erectile dysfunction, but nevertheless have initiated low dose carvedilol because of high suspicion of ischemia and now also LV dysfunction.  LV dysfunction Presentation of new onset heart failure both with left and right heart failure symptoms.   Patient Active Problem List  Diagnosis  . DM  . DYSLIPIDEMIA  . HYPERTENSION  . ACUT MI SUBENDOCARDIAL  INFARCT INIT EPIS CARE  . CORONARY ATHEROSCLEROSIS NATIVE CORONARY ARTERY  . CHEST PAIN  . ERECTILE DYSFUNCTION, SECONDARY TO MEDICATION  . Obstructive sleep apnea  . Dyspnea  . LV dysfunction

## 2011-09-02 NOTE — Assessment & Plan Note (Signed)
Patient has marked dyspnea on exertion with minimal effort. He has symptoms of volume overload and heart failure. He is now found also to have LV dysfunction. Lasix has been initiated as outlined below.

## 2011-09-03 ENCOUNTER — Encounter (HOSPITAL_BASED_OUTPATIENT_CLINIC_OR_DEPARTMENT_OTHER): Payer: Self-pay | Admitting: *Deleted

## 2011-09-03 ENCOUNTER — Encounter (HOSPITAL_BASED_OUTPATIENT_CLINIC_OR_DEPARTMENT_OTHER)
Admission: RE | Disposition: A | Discharge status: Home / Self Care | Payer: Self-pay | Source: Ambulatory Visit | Attending: Cardiovascular Disease

## 2011-09-03 ENCOUNTER — Inpatient Hospital Stay (HOSPITAL_BASED_OUTPATIENT_CLINIC_OR_DEPARTMENT_OTHER)
Admission: RE | Admit: 2011-09-03 | Discharge: 2011-09-03 | Disposition: A | Discharge status: Home / Self Care | Payer: Federal, State, Local not specified - PPO | Source: Ambulatory Visit | Attending: Cardiovascular Disease | Admitting: Cardiovascular Disease

## 2011-09-03 DIAGNOSIS — I2510 Atherosclerotic heart disease of native coronary artery without angina pectoris: Secondary | ICD-10-CM

## 2011-09-03 DIAGNOSIS — E785 Hyperlipidemia, unspecified: Secondary | ICD-10-CM | POA: Insufficient documentation

## 2011-09-03 DIAGNOSIS — I10 Essential (primary) hypertension: Secondary | ICD-10-CM | POA: Insufficient documentation

## 2011-09-03 DIAGNOSIS — E119 Type 2 diabetes mellitus without complications: Secondary | ICD-10-CM | POA: Insufficient documentation

## 2011-09-03 DIAGNOSIS — Z9861 Coronary angioplasty status: Secondary | ICD-10-CM | POA: Insufficient documentation

## 2011-09-03 DIAGNOSIS — Z794 Long term (current) use of insulin: Secondary | ICD-10-CM | POA: Insufficient documentation

## 2011-09-03 DIAGNOSIS — R0609 Other forms of dyspnea: Secondary | ICD-10-CM | POA: Insufficient documentation

## 2011-09-03 DIAGNOSIS — R0989 Other specified symptoms and signs involving the circulatory and respiratory systems: Secondary | ICD-10-CM | POA: Insufficient documentation

## 2011-09-03 DIAGNOSIS — I50810 Right heart failure, unspecified: Secondary | ICD-10-CM

## 2011-09-03 DIAGNOSIS — I252 Old myocardial infarction: Secondary | ICD-10-CM | POA: Insufficient documentation

## 2011-09-03 LAB — POCT I-STAT GLUCOSE
Glucose, Bld: 329 mg/dL — ABNORMAL HIGH (ref 70–99)
Glucose, Bld: 397 mg/dL — ABNORMAL HIGH (ref 70–99)

## 2011-09-03 LAB — POCT I-STAT 3, ART BLOOD GAS (G3+)
Acid-base deficit: 1 mmol/L (ref 0.0–2.0)
Bicarbonate: 24.8 mEq/L — ABNORMAL HIGH (ref 20.0–24.0)
pCO2 arterial: 44.5 mmHg (ref 35.0–45.0)
pH, Arterial: 7.355 (ref 7.350–7.450)
pO2, Arterial: 76 mmHg — ABNORMAL LOW (ref 80.0–100.0)

## 2011-09-03 LAB — POCT I-STAT 3, VENOUS BLOOD GAS (G3P V)
Bicarbonate: 27 mEq/L — ABNORMAL HIGH (ref 20.0–24.0)
pH, Ven: 7.33 — ABNORMAL HIGH (ref 7.250–7.300)

## 2011-09-03 SURGERY — JV LEFT AND RIGHT HEART CATHETERIZATION WITH CORONARY ANGIOGRAM
Anesthesia: Moderate Sedation

## 2011-09-03 MED ORDER — CARVEDILOL 6.25 MG PO TABS
6.2500 mg | ORAL_TABLET | Freq: Two times a day (BID) | ORAL | Status: DC
  Administered 2011-09-03: 6.25 mg via ORAL

## 2011-09-03 MED ORDER — SODIUM CHLORIDE 0.9 % IV SOLN
INTRAVENOUS | Status: DC
  Administered 2011-09-03: 07:00:00 via INTRAVENOUS

## 2011-09-03 MED ORDER — SODIUM CHLORIDE 0.9 % IJ SOLN: 3.0000 mL | INTRAMUSCULAR | Status: DC | PRN

## 2011-09-03 MED ORDER — INSULIN ASPART 100 UNIT/ML ~~LOC~~ SOLN
8.0000 [IU] | Freq: Once | SUBCUTANEOUS | Status: AC
  Administered 2011-09-03: 8 [IU] via SUBCUTANEOUS
  Filled 2011-09-03: qty 3

## 2011-09-03 MED ORDER — SODIUM CHLORIDE 0.9 % IV SOLN: 250.0000 mL | INTRAVENOUS | Status: DC | PRN

## 2011-09-03 MED ORDER — SODIUM CHLORIDE 0.9 % IJ SOLN: 3.0000 mL | Freq: Two times a day (BID) | INTRAMUSCULAR | Status: DC

## 2011-09-03 MED ORDER — DIAZEPAM 5 MG PO TABS
5.0000 mg | ORAL_TABLET | ORAL | Status: AC
  Administered 2011-09-03: 5 mg via ORAL

## 2011-09-03 MED ORDER — SODIUM CHLORIDE 0.9 % IV SOLN: INTRAVENOUS | Status: DC

## 2011-09-03 MED ORDER — ASPIRIN 81 MG PO CHEW: 324.0000 mg | CHEWABLE_TABLET | ORAL | Status: DC

## 2011-09-03 MED ORDER — ACETAMINOPHEN 325 MG PO TABS: 650.0000 mg | ORAL_TABLET | ORAL | Status: DC | PRN

## 2011-09-03 MED ORDER — ONDANSETRON HCL 4 MG/2ML IJ SOLN: 4.0000 mg | Freq: Four times a day (QID) | INTRAMUSCULAR | Status: DC | PRN

## 2011-09-03 NOTE — H&P (View-Only) (Signed)
Alex Krontz De Gent, MD 09/02/2011 6:53 PM Signed  Breyana Follansbee De Gent, MD, FACC  ABIM Board Certified in Adult Cardiovascular Medicine,Internal Medicine and Critical Care Medicine  CC: New-onset congestive heart failure with dyspnea and abnormal Myoview study  HPI:  The patient is 60-year-old male with a prior history of non-ST elevation microinfarction and stent to the LAD.  The patient has not been seen in a while in his office. Over the last 8 months or so the patient has developed increased shortness of breath on exertion. This has been associated with significant weight gain of approximately 20 pounds over that period  Earlier in the year the patient was at the beach and experienced several episodes of dyspnea on minimal exertion with diaphoresis. In the course of this last 8 months is also sinus primary care physician who initiated workup. Chest x-ray and EKG were done and were felt to be within normal limits. Recently the patient was set up for a stress test which was abnormal with evidence of ischemia involving the anterolateral wall as well as a scar in the inferior apical wall.  The patient also has noticed increased edema in the lower extremities. Particularly over the last several weeks he has dyspnea on minimal exertion. One flight of stairs is difficult for him. He denies any substernal chest pain with the patient is a diabetic. Previously also had variable chest pain associated with his non-ST elevation myocardial infarction.  At present blood pressure is also poorly controlled. According to the patient's family the patient snores a lot at night with apneic episodes. He has morning headaches and falls asleep easily during the daytime. He has not had a prior workup for obstructive sleep apnea.  The patient also reports orthopnea and PND. He denies any palpitations presyncope or syncope.  PMH: reviewed and listed in Problem List in Electronic Records (and see below)  Past Medical History   Diagnosis   Date   .  Diabetes mellitus      insulin-dependent   .  HTN (hypertension)    .  Dyslipidemia    .  Chest pain    .  Post MI syndrome  08-29-2009   .  CAD (coronary artery disease)      status post drug eluting stent to the LAd    No past surgical history on file.  Allergies/SH/FHX : available in Electronic Records for review  No Known Allergies  History    Social History   .  Marital Status:  Married     Spouse Name:  N/A     Number of Children:  N/A   .  Years of Education:  N/A    Occupational History   .  Not on file.    Social History Main Topics   .  Smoking status:  Former Smoker -- 0.5 packs/day for 23 years     Types:  Cigarettes     Quit date:  03/15/1988   .  Smokeless tobacco:  Never Used   .  Alcohol Use:  No   .  Drug Use:  No   .  Sexually Active:  Not on file    Other Topics  Concern   .  Not on file    Social History Narrative    Works at [employer]. Married.    Family History   Problem  Relation  Age of Onset   .  Hypertension  Other       in all family members      Medications:  Current Outpatient Prescriptions   Medication  Sig  Dispense  Refill   .  amLODipine (NORVASC) 10 MG tablet  Take 10 mg by mouth daily.     .  aspirin (ASPIR-TRIN) 325 MG EC tablet  Take 325 mg by mouth daily.     .  benazepril (LOTENSIN) 40 MG tablet  Take 40 mg by mouth daily.     .  glipiZIDE (GLIPIZIDE XL) 10 MG 24 hr tablet  Take 10 mg by mouth 2 (two) times daily.     .  insulin NPH-insulin regular (NOVOLIN 70/30) (70-30) 100 UNIT/ML injection  Inject into the skin as directed.     .  metFORMIN (GLUMETZA) 1000 MG (MOD) 24 hr tablet  Take 1,000 mg by mouth 2 (two) times daily.     .  nitroGLYCERIN (NITROSTAT) 0.4 MG SL tablet  Place 1 tablet (0.4 mg total) under the tongue every 5 (five) minutes as needed.  25 tablet  3   .  pravastatin (PRAVACHOL) 80 MG tablet  Take 80 mg by mouth at bedtime.     .  carvedilol (COREG) 3.125 MG tablet  Take 1 tablet (3.125 mg total)  by mouth 2 (two) times daily.  60 tablet  6   .  furosemide (LASIX) 40 MG tablet  Take 1 tablet (40 mg total) by mouth daily.  30 tablet  6   .  isosorbide mononitrate (IMDUR) 30 MG 24 hr tablet  Take 1 tablet (30 mg total) by mouth daily.  30 tablet  6    ROS: No nausea or vomiting. No fever or chills.No melena or hematochezia.No bleeding.No claudication  Physical Exam:  BP 158/92  Pulse 98  Ht 6' 2" (1.88 m)  Wt 253 lb (114.76 kg)  BMI 32.48 kg/m2  SpO2 97%  General: Overweight African American male with mild shortness of breath.  HEENT: Pupils isocoric conjunctiva clear. Oropharynx clear  Neck: Normal carotid upstroke no carotid bruits. No thyromegaly nonnodular thyroid. JVP difficult to evaluate but appears to be around 9-10 cm  Lungs: Diminished breath sounds with few crackles at the bases.  Cardiac: Regular rate and rhythm with normal S1-S2, S4 present no definite S3 and no pathological murmurs  Vascular: 3+ peripheral pitting edema bilaterally. Normal distal pulses including dorsalis pedis and posterior tibial pulses  Skin: Warm and dry  Physcologic: Normal affect  Neurologic: Alert and oriented and grossly nonfocal  12lead ECG:  Limited bedside ECHO:N/A  No images are attached to the encounter.  Assessment and Plan  CORONARY ATHEROSCLEROSIS NATIVE CORONARY ARTERY  Status post prior stent to the LAD. Now patient presents with new-onset heart failure as well as an abnormal Myoview study. Findings consistent with scar in the inferior apical wall and ischemia involving the anterolateral wall. Post stress LVF was 39%. The patient also reports significant shortness of breath on minimal exertion. Although he has very little chest pain he is a diabetic. Given his clinical presentation and abnormal Myoview study I recommended a cardiac catheterization.  In the interim I start the patient on carvedilol 3.125 mg by mouth twice a day, isosorbide mononitrate 30 mg by mouth daily, Lasix 40 mg by  mouth daily and sublingual nitroglycerin when necessary.  The patient has been scheduled for an outpatient left and right heart catheterization.  I discussed the risks and benefits of a diagnostic cardiac catheterization with the patient.  We also discussed the radial versus femoral approach. In particular I quoted that the   risk of bleeding from the radial artery is usually less than 1%. I also explained however that the procedure is more challenging for the cardiologist and does involve a slightly greater radiation exposure although scientist estimate that increased radiation is equivalent to 20 chest x-rays representing only a small risk of the patient.  The following general risks were quoted to the patient for a diagnostic cardiac catheterization:  Risk of right heart catheterization was also discussed with the patient.  Obstructive sleep apnea  No formal diagnosis yet but the patient has high likelihood of OSA with high Epsworth score. We referred the patient to Dr. Wilson for evaluation of obstructive sleep apnea and possible treatment.  Dyspnea  Patient has marked dyspnea on exertion with minimal effort. He has symptoms of volume overload and heart failure. He is now found also to have LV dysfunction. Lasix has been initiated as outlined below.  CHEST PAIN  No definite chest pain the patient is diabetic. Suspect shortness of breath is angina equivalent.  ACUT MI SUBENDOCARDIAL INFARCT INIT EPIS CARE  Status post prior myocardial infarction and stent to the LAD. The patient was previously on prasugrel. Currently not on dual antiplatelet therapy anymore.  ERECTILE DYSFUNCTION, SECONDARY TO MEDICATION  Beta blocker is contributing to erectile dysfunction, but nevertheless have initiated low dose carvedilol because of high suspicion of ischemia and now also LV dysfunction.  LV dysfunction  Presentation of new onset heart failure both with left and right heart failure symptoms.  Patient Active  Problem List   Diagnosis   .  DM   .  DYSLIPIDEMIA   .  HYPERTENSION   .  ACUT MI SUBENDOCARDIAL INFARCT INIT EPIS CARE   .  CORONARY ATHEROSCLEROSIS NATIVE CORONARY ARTERY   .  CHEST PAIN   .  ERECTILE DYSFUNCTION, SECONDARY TO MEDICATION   .  Obstructive sleep apnea   .  Dyspnea   .  LV dysfunction    Ari Engelbrecht De Cortez 09/02/2011 6:59 PM  

## 2011-09-03 NOTE — Interval H&P Note (Signed)
History and Physical Interval Note:  09/03/2011 7:35 AM  Alex Cortez  has presented today for surgery, with the diagnosis of sob/cp  The various methods of treatment have been discussed with the patient and family. After consideration of risks, benefits and other options for treatment, the patient has consented to  Procedure(s) (LRB): JV LEFT AND RIGHT HEART CATHETERIZATION WITH CORONARY ANGIOGRAM (N/A) as a surgical intervention .  The patient's history has been reviewed, patient examined, no change in status, stable for surgery.  I have reviewed the patients' chart and labs.  Questions were answered to the patient's satisfaction.     Elyn Aquas.

## 2011-09-03 NOTE — Progress Notes (Signed)
Bedrest begins @ 0845, tegaderm dressing applied by Army Melia, RN.

## 2011-09-03 NOTE — CV Procedure (Signed)
    Cardiac Cath Note  Alex Cortez [id number] November 14, 1950  Procedure: Right and  Heart Cardiac Catheterization Note Indications: hx of CAD, recent dyspnea  Procedure Details Consent: Obtained Time Out: Verified patient identification, verified procedure, site/side was marked, verified correct patient position, special equipment/implants available, Radiology Safety Procedures followed,  medications/allergies/relevent history reviewed, required imaging and test results available.  Performed   Medications: Fentanyl: 100 mcg iv Versed: 3 mg IV  The right femoral artery was easily canulated using a modified Seldinger technique.  Hemodynamics:   RA: 13/11/9 RV: 34/7/9 PCWP: 11/9/8 PA:  28/13/19  Cardiac Output   Thermodilution: 6.0 with index of 2.5  Fick : 5.6 with index of 2.3  Arterial Sat: 94 PA Sat: 65.  LV pressure: 158/19/17 Aortic pressure: 153/90/115  Angiography   Left Main: The left main has minor luminal regularities but no significant stenosis.  Left anterior Descending: The LAD has diffuse mild right allergies. There is a stent in the mid LAD which is widely patent. The distal LAD has minor luminal irregularities. There is a 40-50% stenosis in the very distal aspect of the LAD.  The first diagonal branch is a fairly large branch. There are minor little regular disease but no significant stenosis. The second diagonal artery is a moderate-sized vessel. There is a 50% focal stenosis.  Left Circumflex: The left circumflex artery is a moderate-sized vessel. There are minor luminal irregularities  Right Coronary Artery: The right coronary artery is large and dominant. There are diffuse irregularities throughout the RCA. There is a 40% stenosis in the distal RCA. This is followed by  a focal 50% stenosis just prior to the bifurcation of 2 posterior lateral branches. This lesion does not appear to obstruct flow.  LV Gram: The left ventricular function is normal.  Ejection fraction is roughly 65%.  Complications: No apparent complications Patient did tolerate procedure well.  Conclusions:   1. Mild to moderate coronary artery disease. The stent in the LAD is widely patent. There are no significant irregularities to explain his symptoms. 2. Normal left systolic function. Essentially normal right heart pressures. 3. Moderate to severe hypertension. I suspect that this is the cause of the shortness of breath. He will need better blood pressure control 4. Poorly controlled diabetes. His glucose here in the lab was 329. He's on oral hypoglycemics. He will likely need to be started on insulin. We will defer to his medical Dr.  Vesta Mixer, Montez Hageman., MD, [facility] 09/03/2011, 8:25 AM Office - [phone] Pager [phone]

## 2011-09-24 ENCOUNTER — Encounter: Payer: Federal, State, Local not specified - PPO | Admitting: Physician Assistant

## 2011-10-01 ENCOUNTER — Encounter: Payer: Self-pay | Admitting: Physician Assistant

## 2011-10-01 ENCOUNTER — Ambulatory Visit (INDEPENDENT_AMBULATORY_CARE_PROVIDER_SITE_OTHER): Payer: Federal, State, Local not specified - PPO | Admitting: Physician Assistant

## 2011-10-01 VITALS — BP 115/71 | HR 93 | Ht 75.0 in | Wt 253.0 lb

## 2011-10-01 DIAGNOSIS — I2510 Atherosclerotic heart disease of native coronary artery without angina pectoris: Secondary | ICD-10-CM

## 2011-10-01 DIAGNOSIS — E119 Type 2 diabetes mellitus without complications: Secondary | ICD-10-CM

## 2011-10-01 DIAGNOSIS — E785 Hyperlipidemia, unspecified: Secondary | ICD-10-CM

## 2011-10-01 DIAGNOSIS — I5030 Unspecified diastolic (congestive) heart failure: Secondary | ICD-10-CM

## 2011-10-01 DIAGNOSIS — I10 Essential (primary) hypertension: Secondary | ICD-10-CM

## 2011-10-01 MED ORDER — CARVEDILOL 6.25 MG PO TABS: 6.2500 mg | ORAL_TABLET | Freq: Two times a day (BID) | ORAL | Status: DC

## 2011-10-01 MED ORDER — ASPIRIN EC 81 MG PO TBEC: 81.0000 mg | DELAYED_RELEASE_TABLET | Freq: Every day | ORAL | Status: AC

## 2011-10-01 NOTE — Assessment & Plan Note (Signed)
Much improved following up titration of carvedilol. Continue current regimen.

## 2011-10-01 NOTE — Assessment & Plan Note (Signed)
Improved following recent addition of Lasix, although patient may be benefiting from addition of other medications, as well. Of note, his weight is unchanged. However, he does report less DOE. His peripheral edema appears chronic. Will check followup labs.

## 2011-10-01 NOTE — Assessment & Plan Note (Signed)
On full dose Pravachol, followed by Dr. Leandrew Koyanagi. Aggressive management recommended with target LDL 70 or less,  if feasible. We'll request most recent lipid profile.

## 2011-10-01 NOTE — Assessment & Plan Note (Addendum)
Continue aggressive secondary risk factor modification. ASA 81 mg daily, indefinitely. Continue carvedilol at recent increased dose of 6.25 twice a day, for aggressive HTN control. Of note, patient has normal LVF, by catheterization (EF 39% by recent Myoview).

## 2011-10-01 NOTE — Progress Notes (Addendum)
Primary Cardiologist: Lewayne Bunting, MD   HPI: Patient presents for post elective catheterization followup.  Procedure performed June 21 yielded mild/moderate CAD, with widely patent LAD stent site; EF 65%. Essentially NL right heart pressures. Moderately severe HTN, which Dr. Elease Hashimoto felt was the etiology for the patient's SOB. He also noted poorly controlled DM (glucose 329).  Patient reports improved exercise tolerance with much less SOB, since last OV. Of note, Coreg was increased following his recent catheterization, for aggressive HTN treatment. He was also recently placed on Imdurr and Lasix, per Dr. Andee Lineman. He has not had followup labs.  He denies any complications of the right groin incision site.  No Known Allergies  Current Outpatient Prescriptions  Medication Sig Dispense Refill  . amLODipine (NORVASC) 10 MG tablet Take 10 mg by mouth daily.        . benazepril (LOTENSIN) 40 MG tablet Take 40 mg by mouth daily.        . carvedilol (COREG) 6.25 MG tablet Take 1 tablet (6.25 mg total) by mouth 2 (two) times daily.  60 tablet  6  . furosemide (LASIX) 40 MG tablet Take 1 tablet (40 mg total) by mouth daily.  30 tablet  6  . glipiZIDE (GLIPIZIDE XL) 10 MG 24 hr tablet Take 10 mg by mouth 2 (two) times daily.        . insulin NPH-insulin regular (NOVOLIN 70/30) (70-30) 100 UNIT/ML injection Inject into the skin as directed.        . isosorbide mononitrate (IMDUR) 30 MG 24 hr tablet Take 1 tablet (30 mg total) by mouth daily.  30 tablet  6  . metFORMIN (GLUMETZA) 1000 MG (MOD) 24 hr tablet Take 1,000 mg by mouth 2 (two) times daily.        . nitroGLYCERIN (NITROSTAT) 0.4 MG SL tablet Place 1 tablet (0.4 mg total) under the tongue every 5 (five) minutes as needed.  25 tablet  3  . pravastatin (PRAVACHOL) 80 MG tablet Take 80 mg by mouth at bedtime.        Marland Kitchen DISCONTD: carvedilol (COREG) 3.125 MG tablet Take 1 tablet (3.125 mg total) by mouth 2 (two) times daily.  60 tablet  6  . aspirin EC  81 MG tablet Take 1 tablet (81 mg total) by mouth daily.        Past Medical History  Diagnosis Date  . Diabetes mellitus     insulin-dependent  . HTN (hypertension)   . Dyslipidemia   . Chest pain   . Post MI syndrome 08-29-2009  . CAD (coronary artery disease)     status post drug eluting stent to the LAd    No past surgical history on file.  History   Social History  . Marital Status: Married    Spouse Name: N/A    Number of Children: N/A  . Years of Education: N/A   Occupational History  . Not on file.   Social History Main Topics  . Smoking status: Former Smoker -- 0.5 packs/day for 23 years    Types: Cigarettes    Quit date: 03/15/1988  . Smokeless tobacco: Never Used  . Alcohol Use: No  . Drug Use: No  . Sexually Active: Not on file   Other Topics Concern  . Not on file   Social History Narrative   Works at [employer]. Married.     Family History  Problem Relation Age of Onset  . Hypertension Other     in all  family members    ROS: no nausea, vomiting; no fever, chills; no melena, hematochezia; no claudication  PHYSICAL EXAM: BP 115/71  Pulse 93  Ht 6\' 3"  (1.905 m)  Wt 253 lb (114.76 kg)  BMI 31.62 kg/m2 GENERAL: 61 year-old male, obese; NAD HEENT: NCAT, PERRLA, EOMI; sclera clear; no xanthelasma NECK: palpable bilateral carotid pulses, no bruits; unable to assess JVD, secondary to neck girth LUNGS: CTA bilaterally CARDIAC: RRR (S1, S2); no significant murmurs; no rubs or gallops ABDOMEN: Protuberant EXTREMETIES: 1-2+, bilateral nonpitting peripheral edema; CVI changes SKIN: warm/dry; no obvious rash/lesions MUSCULOSKELETAL: no joint deformity NEURO: no focal deficit; NL affect   EKG: reviewed and available in Electronic Records   ASSESSMENT & PLAN:  CORONARY ATHEROSCLEROSIS NATIVE CORONARY ARTERY Continue aggressive secondary risk factor modification. ASA 81 mg daily, indefinitely. Continue carvedilol at recent increased dose of 6.25  twice a day, for aggressive HTN control. Of note, patient has normal LVF, by catheterization (EF 39% by recent Myoview).  Diastolic heart failure Improved following recent addition of Lasix, although patient may be benefiting from addition of other medications, as well. Of note, his weight is unchanged. However, he does report less DOE. His peripheral edema appears chronic. Will check followup labs.  HYPERTENSION Much improved following up titration of carvedilol. Continue current regimen.  DYSLIPIDEMIA On full dose Pravachol, followed by Dr. Leandrew Koyanagi. Aggressive management recommended with target LDL 70 or less,  if feasible. We'll request most recent lipid profile.  DM Followed by Dr. Dewaine Oats, [facility]

## 2011-10-01 NOTE — Assessment & Plan Note (Signed)
Followed by Dr. Burdine. 

## 2011-10-01 NOTE — Patient Instructions (Addendum)
   Decrease Aspirin to 81mg  daily  Increase Coreg to 6.25mg  twice a day  Your physician wants you to follow up in: 6 months.  You will receive a reminder letter in the mail one-two months in advance.  If you don't receive a letter, please call our office to schedule the follow up appointment

## 2011-10-08 ENCOUNTER — Encounter: Payer: Self-pay | Admitting: *Deleted

## 2012-03-30 ENCOUNTER — Other Ambulatory Visit: Payer: Self-pay | Admitting: Cardiology

## 2012-04-19 ENCOUNTER — Encounter: Payer: Self-pay | Admitting: Physician Assistant

## 2012-04-19 ENCOUNTER — Ambulatory Visit (INDEPENDENT_AMBULATORY_CARE_PROVIDER_SITE_OTHER): Payer: Federal, State, Local not specified - PPO | Admitting: Physician Assistant

## 2012-04-19 VITALS — BP 114/73 | HR 85 | Ht 74.0 in | Wt 249.0 lb

## 2012-04-19 DIAGNOSIS — I10 Essential (primary) hypertension: Secondary | ICD-10-CM

## 2012-04-19 DIAGNOSIS — E785 Hyperlipidemia, unspecified: Secondary | ICD-10-CM

## 2012-04-19 DIAGNOSIS — I2510 Atherosclerotic heart disease of native coronary artery without angina pectoris: Secondary | ICD-10-CM

## 2012-04-19 NOTE — Progress Notes (Signed)
Primary Cardiologist: Simona Huh, MD (new)   HPI: Schedule six-month followup.  Patient returns with no interim development of exertional CP. He does, however, continue to experience mild/moderate DOE, if walking uphill or a flight of stairs. However, he denies any exacerbation. He does exercise by walking, but not on any daily basis.  No Known Allergies  Current Outpatient Prescriptions  Medication Sig Dispense Refill  . amLODipine (NORVASC) 10 MG tablet Take 10 mg by mouth daily.        Marland Kitchen aspirin EC 81 MG tablet Take 1 tablet (81 mg total) by mouth daily.      . benazepril (LOTENSIN) 40 MG tablet Take 40 mg by mouth daily.        . carvedilol (COREG) 6.25 MG tablet Take 1 tablet (6.25 mg total) by mouth 2 (two) times daily.  60 tablet  6  . glipiZIDE (GLIPIZIDE XL) 10 MG 24 hr tablet Take 10 mg by mouth 2 (two) times daily.        . insulin NPH-insulin regular (NOVOLIN 70/30) (70-30) 100 UNIT/ML injection Inject into the skin as directed. 120 units every morning & 60 units every evening      . isosorbide mononitrate (IMDUR) 30 MG 24 hr tablet TAKE 1 TABLET BY MOUTH EVERY DAY  30 tablet  2  . metFORMIN (GLUMETZA) 1000 MG (MOD) 24 hr tablet Take 1,000 mg by mouth 2 (two) times daily.        . pravastatin (PRAVACHOL) 80 MG tablet Take 80 mg by mouth at bedtime.        . nitroGLYCERIN (NITROSTAT) 0.4 MG SL tablet Place 1 tablet (0.4 mg total) under the tongue every 5 (five) minutes as needed.  25 tablet  3    Past Medical History  Diagnosis Date  . Diabetes mellitus     insulin-dependent  . HTN (hypertension)   . Dyslipidemia   . Chest pain   . Post MI syndrome 08-29-2009  . CAD (coronary artery disease)     nonobstructive CAD/patent LAD stent site; EF 65%, 08/2011; NSTEMI/DES LAD, 6/20 11    No past surgical history on file.  History   Social History  . Marital Status: Married    Spouse Name: N/A    Number of Children: N/A  . Years of Education: N/A   Occupational History   . Not on file.   Social History Main Topics  . Smoking status: Former Smoker -- 0.5 packs/day for 23 years    Types: Cigarettes    Quit date: 03/15/1988  . Smokeless tobacco: Never Used  . Alcohol Use: No  . Drug Use: No  . Sexually Active: Not on file   Other Topics Concern  . Not on file   Social History Narrative   Works at [employer]. Married.    Social History Narrative   Works at [employer]. Married.     Problem Relation Age of Onset  . Hypertension Other     in all family members    ROS: no nausea, vomiting; no fever, chills; no melena, hematochezia; no claudication  PHYSICAL EXAM: BP 114/73  Pulse 85  Ht 6\' 2"  (1.88 m)  Wt 249 lb (112.946 kg)  BMI 31.97 kg/m2  SpO2 97% GENERAL: 62 year-old male; NAD HEENT: NCAT, PERRLA, EOMI; sclera clear; no xanthelasma NECK: palpable bilateral carotid pulses, no bruits; no JVD; no TM LUNGS: CTA bilaterally CARDIAC: RRR (S1, S2); no significant murmurs; no rubs or gallops ABDOMEN: Protuberant EXTREMETIES: no  significant peripheral edema SKIN: warm/dry; no obvious rash/lesions MUSCULOSKELETAL: no joint deformity NEURO: no focal deficit; NL affect   EKG:    ASSESSMENT & PLAN:  CORONARY ATHEROSCLEROSIS NATIVE CORONARY ARTERY Quiescent on current medication regimen. Continued aggressive secondary risk factor modification recommended. Reassess clinical status in 6 months, at which time patient will establish with Dr. Diona Browner.  DYSLIPIDEMIA On full dose Pravachol, followed by primary M.D.  HYPERTENSION Well-controlled on current medication regimen    Gene Aslee Such, PAC

## 2012-04-19 NOTE — Patient Instructions (Addendum)
Continue all current medications. Regular walking program Your physician wants you to follow up in: 6 months.  You will receive a reminder letter in the mail one-two months in advance.  If you don't receive a letter, please call our office to schedule the follow up appointment

## 2012-04-19 NOTE — Assessment & Plan Note (Signed)
Quiescent on current medication regimen. Continued aggressive secondary risk factor modification recommended. Reassess clinical status in 6 months, at which time patient will establish with Dr. Diona Browner.

## 2012-04-19 NOTE — Assessment & Plan Note (Signed)
On full dose Pravachol, followed by primary M.D.

## 2012-04-19 NOTE — Assessment & Plan Note (Signed)
Well-controlled on current medication regimen 

## 2012-05-17 ENCOUNTER — Other Ambulatory Visit: Payer: Self-pay | Admitting: Physician Assistant

## 2012-06-30 ENCOUNTER — Other Ambulatory Visit: Payer: Self-pay | Admitting: Physician Assistant

## 2012-07-19 ENCOUNTER — Other Ambulatory Visit: Payer: Self-pay | Admitting: Cardiology

## 2012-07-23 ENCOUNTER — Other Ambulatory Visit: Payer: Self-pay | Admitting: Cardiology

## 2012-10-13 ENCOUNTER — Other Ambulatory Visit: Payer: Self-pay | Admitting: Physician Assistant

## 2012-10-18 ENCOUNTER — Ambulatory Visit (INDEPENDENT_AMBULATORY_CARE_PROVIDER_SITE_OTHER): Payer: Federal, State, Local not specified - PPO | Admitting: Physician Assistant

## 2012-10-18 ENCOUNTER — Encounter: Payer: Self-pay | Admitting: Physician Assistant

## 2012-10-18 VITALS — BP 146/84 | HR 89 | Ht 74.5 in | Wt 250.1 lb

## 2012-10-18 DIAGNOSIS — R0609 Other forms of dyspnea: Secondary | ICD-10-CM | POA: Insufficient documentation

## 2012-10-18 DIAGNOSIS — E785 Hyperlipidemia, unspecified: Secondary | ICD-10-CM

## 2012-10-18 DIAGNOSIS — R079 Chest pain, unspecified: Secondary | ICD-10-CM

## 2012-10-18 DIAGNOSIS — I2510 Atherosclerotic heart disease of native coronary artery without angina pectoris: Secondary | ICD-10-CM

## 2012-10-18 MED ORDER — ATORVASTATIN CALCIUM 80 MG PO TABS: 80.0000 mg | ORAL_TABLET | Freq: Every evening | ORAL | Status: DC

## 2012-10-18 MED ORDER — CARVEDILOL 12.5 MG PO TABS: 12.5000 mg | ORAL_TABLET | Freq: Two times a day (BID) | ORAL | Status: DC

## 2012-10-18 NOTE — Patient Instructions (Addendum)
   Increase Coreg to 12.5mg  twice a day - new sent to pharm  Stop Lopressor  Stop Pravachol  Begin Lipitor 80mg  every evening Continue all other current medications. Labs for fasting lipid and liver panel - due in 12 weeks - will mail reminder  Your physician wants you to follow up in: 6 months.  You will receive a reminder letter in the mail one-two months in advance.  If you don't receive a letter, please call our office to schedule the follow up appointment

## 2012-10-18 NOTE — Assessment & Plan Note (Signed)
Patient suggests recent labile readings, as high as 160 systolic, in the ambulatory setting. To be reassessed at followup OV, following today's increase in carvedilol dose.

## 2012-10-18 NOTE — Assessment & Plan Note (Signed)
Patient suggests stable, chronic DOE, since undergoing PCI in June 2013; NL LVF. Recommend combination weight loss and exercise program, to improve baseline exercise tolerance capacity.

## 2012-10-18 NOTE — Assessment & Plan Note (Signed)
Remains quiescent on medical therapy, reporting no recurrent CP since undergoing PCI in June 2013. No further workup currently indicated. Of note, however, patient is currently on both carvedilol and Lopressor (Lopressor not on med list at previous OV). Recommended increasing carvedilol to 12.5 twice a day, for more aggressive BP control, and discontinuing Lopressor.

## 2012-10-18 NOTE — Progress Notes (Signed)
Primary Cardiologist: Simona Huh, MD   HPI: Scheduled six-month followup.  Patient returns reporting essentially stable, chronic DOE. He continues to report no interim development of exertional CP and has not had any recurrent CP, since undergoing PCI in June 2013.   Twelve-lead EKG today, reviewed by me, indicates NSR 89 bpm; NSST changes  No Known Allergies  Current Outpatient Prescriptions  Medication Sig Dispense Refill  . amLODipine (NORVASC) 10 MG tablet Take 10 mg by mouth daily.        Marland Kitchen aspirin 81 MG tablet Take 81 mg by mouth daily.      . benazepril (LOTENSIN) 40 MG tablet Take 40 mg by mouth 2 (two) times daily.       . carvedilol (COREG) 12.5 MG tablet Take 1 tablet (12.5 mg total) by mouth 2 (two) times daily.  60 tablet  6  . glipiZIDE (GLIPIZIDE XL) 10 MG 24 hr tablet Take 10 mg by mouth 2 (two) times daily.        . insulin NPH-insulin regular (NOVOLIN 70/30) (70-30) 100 UNIT/ML injection Inject into the skin as directed. 120 units every morning & 55 units every evening      . isosorbide mononitrate (IMDUR) 30 MG 24 hr tablet TAKE 1 TABLET BY MOUTH EVERY DAY  30 tablet  4  . metFORMIN (GLUMETZA) 1000 MG (MOD) 24 hr tablet Take 1,000 mg by mouth 2 (two) times daily.        . nitroGLYCERIN (NITROSTAT) 0.4 MG SL tablet Place 1 tablet (0.4 mg total) under the tongue every 5 (five) minutes as needed.  25 tablet  3  . atorvastatin (LIPITOR) 80 MG tablet Take 1 tablet (80 mg total) by mouth every evening.  30 tablet  6   No current facility-administered medications for this visit.    Past Medical History  Diagnosis Date  . Diabetes mellitus     insulin-dependent  . HTN (hypertension)   . Dyslipidemia   . Chest pain   . Post MI syndrome 08-29-2009  . CAD (coronary artery disease)     nonobstructive CAD/patent LAD stent site; EF 65%, 08/2011; NSTEMI/DES LAD, 6/20 11    No past surgical history on file.  History   Social History  . Marital Status: Married   Spouse Name: N/A    Number of Children: N/A  . Years of Education: N/A   Occupational History  . Not on file.   Social History Main Topics  . Smoking status: Former Smoker -- 0.50 packs/day for 23 years    Types: Cigarettes    Quit date: 03/15/1988  . Smokeless tobacco: Never Used  . Alcohol Use: No  . Drug Use: No  . Sexually Active: Not on file   Other Topics Concern  . Not on file   Social History Narrative   Works at [employer]. Married.     Family History  Problem Relation Age of Onset  . Hypertension Other     in all family members    ROS: no nausea, vomiting; no fever, chills; no melena, hematochezia; no claudication  PHYSICAL EXAM: BP 146/84  Pulse 89  Ht 6' 2.5" (1.892 m)  Wt 250 lb 1.9 oz (113.454 kg)  BMI 31.69 kg/m2 GENERAL: 62 year-old male, obese; NAD  HEENT: NCAT, PERRLA, EOMI; sclera clear; no xanthelasma  NECK: palpable bilateral carotid pulses, no bruits; no JVD; no TM  LUNGS: CTA bilaterally  CARDIAC: RRR (S1, S2); no significant murmurs; no rubs or gallops  ABDOMEN: Protuberant  EXTREMETIES: no significant peripheral edema  SKIN: warm/dry; no obvious rash/lesions  MUSCULOSKELETAL: no joint deformity  NEURO: no focal deficit; NL affec    EKG: reviewed and available in Electronic Records   ASSESSMENT & PLAN:  CORONARY ATHEROSCLEROSIS NATIVE CORONARY ARTERY Remains quiescent on medical therapy, reporting no recurrent CP since undergoing PCI in June 2013. No further workup currently indicated. Of note, however, patient is currently on both carvedilol and Lopressor (Lopressor not on med list at previous OV). Recommended increasing carvedilol to 12.5 twice a day, for more aggressive BP control, and discontinuing Lopressor.  HYPERTENSION Patient suggests recent labile readings, as high as 160 systolic, in the ambulatory setting. To be reassessed at followup OV, following today's increase in carvedilol dose.  DYSLIPIDEMIA Followed by primary  M.D., with most recent LDL 106. Recommend substituting full dose Pravachol with full dose Lipitor, and reassessing lipid status in 12 weeks. Recommend high intensity statin therapy and target LDL 70 or less, if feasible.  Exertional dyspnea Patient suggests stable, chronic DOE, since undergoing PCI in June 2013; NL LVF. Recommend combination weight loss and exercise program, to improve baseline exercise tolerance capacity.    Gene Feras Gardella, PAC

## 2012-10-18 NOTE — Assessment & Plan Note (Signed)
Followed by primary M.D., with most recent LDL 106. Recommend substituting full dose Pravachol with full dose Lipitor, and reassessing lipid status in 12 weeks. Recommend high intensity statin therapy and target LDL 70 or less, if feasible.

## 2012-10-21 DIAGNOSIS — R42 Dizziness and giddiness: Secondary | ICD-10-CM

## 2012-10-22 DIAGNOSIS — I639 Cerebral infarction, unspecified: Secondary | ICD-10-CM

## 2012-10-22 HISTORY — DX: Cerebral infarction, unspecified: I63.9

## 2012-10-23 DIAGNOSIS — I639 Cerebral infarction, unspecified: Secondary | ICD-10-CM

## 2012-10-23 HISTORY — DX: Cerebral infarction, unspecified: I63.9

## 2012-11-23 ENCOUNTER — Other Ambulatory Visit: Payer: Self-pay | Admitting: Physician Assistant

## 2013-04-23 ENCOUNTER — Ambulatory Visit (INDEPENDENT_AMBULATORY_CARE_PROVIDER_SITE_OTHER): Payer: Federal, State, Local not specified - PPO | Admitting: Cardiovascular Disease

## 2013-04-23 ENCOUNTER — Encounter: Payer: Self-pay | Admitting: Cardiovascular Disease

## 2013-04-23 VITALS — BP 147/84 | HR 85 | Ht 75.0 in | Wt 248.0 lb

## 2013-04-23 DIAGNOSIS — I2510 Atherosclerotic heart disease of native coronary artery without angina pectoris: Secondary | ICD-10-CM

## 2013-04-23 DIAGNOSIS — E119 Type 2 diabetes mellitus without complications: Secondary | ICD-10-CM

## 2013-04-23 DIAGNOSIS — I10 Essential (primary) hypertension: Secondary | ICD-10-CM

## 2013-04-23 DIAGNOSIS — E785 Hyperlipidemia, unspecified: Secondary | ICD-10-CM

## 2013-04-23 DIAGNOSIS — Z8673 Personal history of transient ischemic attack (TIA), and cerebral infarction without residual deficits: Secondary | ICD-10-CM

## 2013-04-23 MED ORDER — CARVEDILOL 25 MG PO TABS
25.0000 mg | ORAL_TABLET | Freq: Two times a day (BID) | ORAL | Status: DC
Start: 2013-04-23 — End: 2013-11-29

## 2013-04-23 NOTE — Patient Instructions (Addendum)
   Increase Coreg to 25mg  twice a day - new sent to pharm  Continue all other medications.   Cholesterol followed by Dr. Pleas Koch Your physician wants you to follow up in: 6 months.  You will receive a reminder letter in the mail one-two months in advance.  If you don't receive a letter, please call our office to schedule the follow up appointment

## 2013-04-23 NOTE — Progress Notes (Signed)
Patient ID: Alex Cortez, male   DOB: March 07, 1951, 63 y.o.   MRN: [id number]      SUBJECTIVE: The patient is a 63 year old male with a history of coronary artery disease with a previously placed mid LAD drug-eluting stent in June 2011. His most recent coronary angiogram was in June 2013 which revealed a widely patent stent. He also has hypertension, hyperlipidemia, CVA in 10/2012, and insulin-dependent diabetes mellitus. The patient denies any symptoms of chest pain, palpitations, shortness of breath, lightheadedness, dizziness, leg swelling, orthopnea, PND, and syncope. He's had some visual disturbances and is going to see an ophthalmologist regarding this. He also has balance problems and even had a fall 2 weeks ago down 13 steps, but reportedly does not require the use of a cane or walker.      No Known Allergies  Current Outpatient Prescriptions  Medication Sig Dispense Refill  . amLODipine (NORVASC) 10 MG tablet Take 10 mg by mouth daily.        Marland Kitchen aspirin 81 MG tablet Take 81 mg by mouth daily.      Marland Kitchen atorvastatin (LIPITOR) 80 MG tablet Take 1 tablet (80 mg total) by mouth every evening.  30 tablet  6  . benazepril (LOTENSIN) 40 MG tablet Take 40 mg by mouth 2 (two) times daily.       . carvedilol (COREG) 12.5 MG tablet Take 1 tablet (12.5 mg total) by mouth 2 (two) times daily.  60 tablet  6  . glipiZIDE (GLIPIZIDE XL) 10 MG 24 hr tablet Take 10 mg by mouth 2 (two) times daily.        . insulin NPH-insulin regular (NOVOLIN 70/30) (70-30) 100 UNIT/ML injection Inject into the skin as directed. 120 units every morning & 55 units every evening      . isosorbide mononitrate (IMDUR) 30 MG 24 hr tablet TAKE 1 TABLET BY MOUTH DAILY  30 tablet  4  . metFORMIN (GLUMETZA) 1000 MG (MOD) 24 hr tablet Take 1,000 mg by mouth 2 (two) times daily.        . nitroGLYCERIN (NITROSTAT) 0.4 MG SL tablet Place 1 tablet (0.4 mg total) under the tongue every 5 (five) minutes as needed.  25 tablet  3   No  current facility-administered medications for this visit.    Past Medical History  Diagnosis Date  . Diabetes mellitus     insulin-dependent  . HTN (hypertension)   . Dyslipidemia   . Chest pain   . Post MI syndrome 08-29-2009  . CAD (coronary artery disease)     nonobstructive CAD/patent LAD stent site; EF 65%, 08/2011; NSTEMI/DES LAD, 6/20 11    No past surgical history on file.  History   Social History  . Marital Status: Married    Spouse Name: N/A    Number of Children: N/A  . Years of Education: N/A   Occupational History  . Not on file.   Social History Main Topics  . Smoking status: Former Smoker -- 0.50 packs/day for 23 years    Types: Cigarettes    Quit date: 03/15/1988  . Smokeless tobacco: Never Used  . Alcohol Use: No  . Drug Use: No  . Sexual Activity: Not on file   Other Topics Concern  . Not on file   Social History Narrative   Works at [employer]. Married.     BP 147/84  Pulse 85    PHYSICAL EXAM General: NAD Neck: No JVD, no thyromegaly or thyroid nodule.  Lungs: Clear to auscultation bilaterally with normal respiratory effort. CV: Nondisplaced PMI.  Heart regular S1/S2, no S3/S4, no murmur.  No peripheral edema.  No carotid bruit.  Normal pedal pulses.  Abdomen: Soft, nontender, no hepatosplenomegaly, no distention.  Neurologic: Alert and oriented x 3.  Psych: Normal affect. Extremities: No clubbing or cyanosis.   ECG: reviewed and available in electronic records.      ASSESSMENT AND PLAN:  CAD  Remains quiescent on medical therapy, reporting no recurrent CP since undergoing PCI of the mid-LAD in June 2011. His most recent coronary angiogram in 08/2011 showed a widely patent stent.  HTN Uncontrolled today. Will increase Coreg to 25 mg bid.  Hyperlipidemia  Followed by primary M.D. (Dr. Pleas Koch). Will check to see if lipids were recently checked. If not, will obtain a lipid panel.  Diabetes Mellitus Followed by both Dr.  Pleas Koch (PCP) and Dr. Dorris Fetch (endocrinologist).   Dispo: f/u 6 months.   Kate Sable, M.D., F.A.C.C.

## 2013-05-03 ENCOUNTER — Other Ambulatory Visit: Payer: Self-pay | Admitting: Cardiology

## 2013-05-03 MED ORDER — ISOSORBIDE MONONITRATE ER 30 MG PO TB24: ORAL_TABLET | ORAL | Status: DC

## 2013-05-31 ENCOUNTER — Other Ambulatory Visit: Payer: Self-pay | Admitting: *Deleted

## 2013-05-31 MED ORDER — ATORVASTATIN CALCIUM 80 MG PO TABS: 80.0000 mg | ORAL_TABLET | Freq: Every evening | ORAL | Status: DC

## 2013-10-25 ENCOUNTER — Encounter: Payer: Self-pay | Admitting: *Deleted

## 2013-10-25 ENCOUNTER — Ambulatory Visit (INDEPENDENT_AMBULATORY_CARE_PROVIDER_SITE_OTHER): Payer: Federal, State, Local not specified - PPO | Admitting: Cardiovascular Disease

## 2013-10-25 ENCOUNTER — Encounter: Payer: Self-pay | Admitting: Cardiovascular Disease

## 2013-10-25 VITALS — BP 167/79 | HR 79 | Ht 75.0 in | Wt 257.0 lb

## 2013-10-25 DIAGNOSIS — R0609 Other forms of dyspnea: Secondary | ICD-10-CM

## 2013-10-25 DIAGNOSIS — Z8673 Personal history of transient ischemic attack (TIA), and cerebral infarction without residual deficits: Secondary | ICD-10-CM

## 2013-10-25 DIAGNOSIS — E785 Hyperlipidemia, unspecified: Secondary | ICD-10-CM

## 2013-10-25 DIAGNOSIS — I2510 Atherosclerotic heart disease of native coronary artery without angina pectoris: Secondary | ICD-10-CM

## 2013-10-25 DIAGNOSIS — R0989 Other specified symptoms and signs involving the circulatory and respiratory systems: Secondary | ICD-10-CM

## 2013-10-25 DIAGNOSIS — I10 Essential (primary) hypertension: Secondary | ICD-10-CM

## 2013-10-25 DIAGNOSIS — E119 Type 2 diabetes mellitus without complications: Secondary | ICD-10-CM

## 2013-10-25 DIAGNOSIS — J069 Acute upper respiratory infection, unspecified: Secondary | ICD-10-CM

## 2013-10-25 MED ORDER — EZETIMIBE 10 MG PO TABS: 10.0000 mg | ORAL_TABLET | Freq: Every day | ORAL | Status: DC

## 2013-10-25 NOTE — Progress Notes (Signed)
Patient ID: Alex Cortez, male   DOB: March 16, 1950, 63 y.o.   MRN: [id number]      SUBJECTIVE: The patient is a 63 year old male with a history of coronary artery disease with a previously placed mid LAD drug-eluting stent in June 2011. His most recent coronary angiogram was in June 2013 which revealed a widely patent stent. He also has hypertension, hyperlipidemia, CVA in 10/2012, and insulin-dependent diabetes mellitus.  The patient denies any symptoms of chest pain, palpitations, lightheadedness, dizziness, leg swelling, orthopnea, PND, and syncope. He has been having intermittent shortness of breath since starting Farxiga. He has not spoken about this with Dr. Dorris Fetch yet, but is due to see him in September. He has some nasal congestion but no fevers. He routinely checks his BP at home and it runs 130-140/70 mmHg. He eats tenderloin, sausage, and bacon fairly regularly.  ECG performed in the office today demonstrates normal sinus rhythm with no ischemic ST segment or T-wave abnormalities.   Review of Systems: As per "subjective", otherwise negative.  No Known Allergies  Current Outpatient Prescriptions  Medication Sig Dispense Refill  . amLODipine (NORVASC) 10 MG tablet Take 10 mg by mouth daily.        Marland Kitchen aspirin 325 MG tablet Take 325 mg by mouth daily.      Marland Kitchen atorvastatin (LIPITOR) 80 MG tablet Take 1 tablet (80 mg total) by mouth every evening.  30 tablet  6  . benazepril (LOTENSIN) 40 MG tablet Take 40 mg by mouth 2 (two) times daily.       . carvedilol (COREG) 25 MG tablet Take 1 tablet (25 mg total) by mouth 2 (two) times daily.  60 tablet  6  . Dapagliflozin Propanediol (FARXIGA) 10 MG TABS Take 10 mg by mouth daily.      . insulin detemir (LEVEMIR) 100 UNIT/ML injection Inject 80 Units into the skin at bedtime.      . Insulin Glulisine (APIDRA [place]) Inject into the skin as directed.      . isosorbide mononitrate (IMDUR) 30 MG 24 hr tablet TAKE 1 TABLET BY MOUTH DAILY  30 tablet  4   . metFORMIN (GLUMETZA) 1000 MG (MOD) 24 hr tablet Take 1,000 mg by mouth 2 (two) times daily.        . nitroGLYCERIN (NITROSTAT) 0.4 MG SL tablet Place 1 tablet (0.4 mg total) under the tongue every 5 (five) minutes as needed.  25 tablet  3   No current facility-administered medications for this visit.    Past Medical History  Diagnosis Date  . Diabetes mellitus     insulin-dependent  . HTN (hypertension)   . Dyslipidemia   . Chest pain   . Post MI syndrome 08-29-2009  . CAD (coronary artery disease)     nonobstructive CAD/patent LAD stent site; EF 65%, 08/2011; NSTEMI/DES LAD, 6/20 11    No past surgical history on file.  History   Social History  . Marital Status: Married    Spouse Name: N/A    Number of Children: N/A  . Years of Education: N/A   Occupational History  . Not on file.   Social History Main Topics  . Smoking status: Former Smoker -- 0.50 packs/day for 23 years    Types: Cigarettes    Quit date: 03/15/1988  . Smokeless tobacco: Never Used  . Alcohol Use: No  . Drug Use: No  . Sexual Activity: Not on file   Other Topics Concern  . Not on file  Social History Narrative   Works at [employer]. Married.      Filed Vitals:   10/25/13 0949  BP: 167/79  Pulse: 79  Height: 6\' 3"  (1.905 m)  Weight: 257 lb (116.574 kg)  SpO2: 97%    PHYSICAL EXAM General: NAD Neck: No JVD, no thyromegaly. Lungs: Clear to auscultation bilaterally with normal respiratory effort. CV: Nondisplaced PMI.  Regular rate and rhythm, normal S1/S2, no S3/S4, no murmur. No pretibial or periankle edema.  No carotid bruit.  Normal pedal pulses.  Abdomen: Soft, nontender, no hepatosplenomegaly, no distention.  Neurologic: Alert and oriented x 3.  Psych: Normal affect. Extremities: No clubbing or cyanosis.   ECG: reviewed and available in electronic records.      ASSESSMENT AND PLAN:  CAD  Remains quiescent on medical therapy, reporting no recurrent CP since undergoing  PCI of the mid-LAD in June 2011. His most recent coronary angiogram in 08/2011 showed a widely patent stent.   Essential HTN  Uncontrolled today, but reportedly normal at home. Continue Coreg 25 mg bid. Low-sodium diet emphasized.  Hyperlipidemia  Followed by primary M.D. (Dr. Pleas Koch). On 04/18/13, total cholesterol 163, triglycerides 129, HDL 32, LDL 105. Transaminases were normal. Will add Zetia 10 mg daily and repeat lipids and LFT's in 3 months.  Diabetes Mellitus  Followed by both Dr. Pleas Koch (PCP) and Dr. Dorris Fetch (endocrinologist).   Shortness of breath [name] does not report shortness of breath as a significant side effect. If this persists in spite of optimal BP control, I would consider stress testing.  Upper respiratory tract infection He requested penicillin, but I informed him that most URI's are caused by viruses and would not be susceptible to antibiotics. I suggested adequate fluids and rest. Denies fevers and sore throat.   Dispo: f/u 6 months.  Kate Sable, M.D., F.A.C.C.

## 2013-10-25 NOTE — Patient Instructions (Addendum)
   Start Zetia 10mg  daily. Sent to pharmacy.  Labs for Lipids and LFTS to be done in 3 months. Your physician wants you to follow up in: 6 months.  You will receive a reminder letter in the mail one-two months in advance.  If you don't receive a letter, please call our office to schedule the follow up appointment

## 2013-11-29 ENCOUNTER — Other Ambulatory Visit: Payer: Self-pay | Admitting: *Deleted

## 2013-11-29 MED ORDER — ATORVASTATIN CALCIUM 80 MG PO TABS: 80.0000 mg | ORAL_TABLET | Freq: Every evening | ORAL | Status: DC

## 2013-11-29 MED ORDER — CARVEDILOL 25 MG PO TABS: 25.0000 mg | ORAL_TABLET | Freq: Two times a day (BID) | ORAL | Status: DC

## 2014-01-25 ENCOUNTER — Telehealth: Payer: Self-pay | Admitting: *Deleted

## 2014-01-25 NOTE — Telephone Encounter (Signed)
Patient never started zetia prescription and doesn't want to do fasting lab work that was requested at last office visit. Patient informed nurse that he did lipid testing with Dr. Liliane Channel office August 2015 and is also having lipid labs done with PCP. Results requested from Nida's office and patient informed to have results from PCP office sent to office.

## 2014-01-28 ENCOUNTER — Telehealth: Payer: Self-pay | Admitting: *Deleted

## 2014-01-28 NOTE — Telephone Encounter (Signed)
Received labs in Dr. Bronson Ing folder

## 2014-02-22 ENCOUNTER — Encounter: Payer: Self-pay | Admitting: Cardiovascular Disease

## 2014-03-04 ENCOUNTER — Other Ambulatory Visit: Payer: Self-pay | Admitting: *Deleted

## 2014-03-04 MED ORDER — ATORVASTATIN CALCIUM 80 MG PO TABS: 80.0000 mg | ORAL_TABLET | Freq: Every evening | ORAL | Status: DC

## 2014-04-23 ENCOUNTER — Encounter: Payer: Self-pay | Admitting: *Deleted

## 2014-04-23 ENCOUNTER — Encounter: Payer: Self-pay | Admitting: Cardiovascular Disease

## 2014-04-23 ENCOUNTER — Ambulatory Visit (INDEPENDENT_AMBULATORY_CARE_PROVIDER_SITE_OTHER): Payer: Federal, State, Local not specified - PPO | Admitting: Cardiovascular Disease

## 2014-04-23 VITALS — BP 118/74 | HR 81 | Ht 75.0 in | Wt 257.0 lb

## 2014-04-23 DIAGNOSIS — E785 Hyperlipidemia, unspecified: Secondary | ICD-10-CM

## 2014-04-23 DIAGNOSIS — I10 Essential (primary) hypertension: Secondary | ICD-10-CM

## 2014-04-23 DIAGNOSIS — I2510 Atherosclerotic heart disease of native coronary artery without angina pectoris: Secondary | ICD-10-CM

## 2014-04-23 DIAGNOSIS — E119 Type 2 diabetes mellitus without complications: Secondary | ICD-10-CM

## 2014-04-23 NOTE — Addendum Note (Signed)
Addended by: Laurine Blazer on: 04/23/2014 10:11 AM   Modules accepted: Level of Service

## 2014-04-23 NOTE — Patient Instructions (Signed)
Continue all current medications. Your physician wants you to follow up in:  1 year.  You will receive a reminder letter in the mail one-two months in advance.  If you don't receive a letter, please call our office to schedule the follow up appointment   

## 2014-04-23 NOTE — Progress Notes (Signed)
Patient ID: Alex Cortez, male   DOB: [birth year], 64 y.o.   MRN: [id number]      SUBJECTIVE: The patient presents for routine follow up. He has a history of coronary artery disease with a previously placed mid LAD drug-eluting stent in June 2011. His most recent coronary angiogram was in June 2013 which revealed a widely patent stent. He also has hypertension, hyperlipidemia, CVA in 10/2012, and insulin-dependent diabetes mellitus. HbA1C 9.8% in 9/15. He eats a diet high in saturated fats. He was apparently evaluated in the [facility] ED for an allergic reaction to shellfish. He denies chest pain and significant shortness of breath, as well as palpitations and leg swelling.   Review of Systems: As per "subjective", otherwise negative.  No Known Allergies  Current Outpatient Prescriptions  Medication Sig Dispense Refill  . amLODipine (NORVASC) 10 MG tablet Take 10 mg by mouth daily.      Marland Kitchen aspirin 325 MG tablet Take 325 mg by mouth daily.    Marland Kitchen atorvastatin (LIPITOR) 80 MG tablet Take 1 tablet (80 mg total) by mouth every evening. 90 tablet 3  . benazepril (LOTENSIN) 40 MG tablet Take 40 mg by mouth 2 (two) times daily.     . carvedilol (COREG) 25 MG tablet Take 1 tablet (25 mg total) by mouth 2 (two) times daily. 180 tablet 3  . Dapagliflozin Propanediol (FARXIGA) 10 MG TABS Take 10 mg by mouth daily.    . insulin detemir (LEVEMIR) 100 UNIT/ML injection Inject 80 Units into the skin at bedtime.    . Insulin Glulisine (APIDRA [place]) Inject into the skin as directed.    . isosorbide mononitrate (IMDUR) 30 MG 24 hr tablet TAKE 1 TABLET BY MOUTH DAILY 30 tablet 4  . metFORMIN (GLUMETZA) 1000 MG (MOD) 24 hr tablet Take 1,000 mg by mouth 2 (two) times daily.      . nitroGLYCERIN (NITROSTAT) 0.4 MG SL tablet Place 1 tablet (0.4 mg total) under the tongue every 5 (five) minutes as needed. 25 tablet 3  . Vitamin D, Ergocalciferol, (DRISDOL) 50000 UNITS CAPS capsule Take 50,000 Units by mouth once a  week.  0   No current facility-administered medications for this visit.    Past Medical History  Diagnosis Date  . Diabetes mellitus     insulin-dependent  . HTN (hypertension)   . Dyslipidemia   . Chest pain   . Post MI syndrome 08-29-2009  . CAD (coronary artery disease)     nonobstructive CAD/patent LAD stent site; EF 65%, 08/2011; NSTEMI/DES LAD, 6/20 11    No past surgical history on file.  History   Social History  . Marital Status: Married    Spouse Name: N/A    Number of Children: N/A  . Years of Education: N/A   Occupational History  . Not on file.   Social History Main Topics  . Smoking status: Former Smoker -- 0.50 packs/day for 23 years    Types: Cigarettes    Start date: 03/16/1955    Quit date: 03/15/1988  . Smokeless tobacco: Never Used  . Alcohol Use: No  . Drug Use: No  . Sexual Activity: Not on file   Other Topics Concern  . Not on file   Social History Narrative   Works at [employer]. Married.      Filed Vitals:   04/23/14 0945  BP: 118/74  Pulse: 81  Height: 6\' 3"  (1.905 m)  Weight: 257 lb (116.574 kg)    PHYSICAL EXAM  General: NAD HEENT: Normal. Neck: No JVD, no thyromegaly. Lungs: Clear to auscultation bilaterally with normal respiratory effort. CV: Nondisplaced PMI.  Regular rate and rhythm, normal S1/S2, no S3/S4, no murmur. No pretibial or periankle edema.  No carotid bruit.  Normal pedal pulses.  Abdomen: Soft, firm, obese.  Neurologic: Alert and oriented x 3.  Psych: Normal affect. Skin: Normal. Musculoskeletal: No gross deformities. Extremities: No clubbing or cyanosis.   ECG: Most recent ECG reviewed.      ASSESSMENT AND PLAN: CAD  Remains quiescent on medical therapy, reporting no recurrent CP since undergoing PCI of the mid-LAD in June 2011. His most recent coronary angiogram in 08/2011 showed a widely patent stent.  Continue ASA, Coreg, Imdur, and Lipitor.  Essential HTN  Well controlled today. Continue  Coreg 25 mg bid, amlodipine 10 mg, and benazepril 40 mg bid. Low-sodium and low-fat diet emphasized.  Hyperlipidemia  Will obtain copy of most recent lipids. On Lipitor 80 mg.  Diabetes Mellitus  Uncontrolled in 9/15. Followed by both Dr. Pleas Koch (PCP) and Dr. Dorris Fetch (endocrinologist).   Dispo: f/u 1 year.  Kate Sable, M.D., F.A.C.C.

## 2014-09-22 ENCOUNTER — Encounter (HOSPITAL_COMMUNITY): Payer: Self-pay | Admitting: Emergency Medicine

## 2014-09-22 ENCOUNTER — Telehealth: Payer: Self-pay | Admitting: *Deleted

## 2014-09-22 ENCOUNTER — Emergency Department (HOSPITAL_COMMUNITY)
Admission: EM | Admit: 2014-09-22 | Discharge: 2014-09-22 | Disposition: A | Discharge status: Home / Self Care | Payer: Federal, State, Local not specified - PPO | Attending: Emergency Medicine | Admitting: Emergency Medicine

## 2014-09-22 DIAGNOSIS — I10 Essential (primary) hypertension: Secondary | ICD-10-CM | POA: Diagnosis not present

## 2014-09-22 DIAGNOSIS — I2510 Atherosclerotic heart disease of native coronary artery without angina pectoris: Secondary | ICD-10-CM | POA: Insufficient documentation

## 2014-09-22 DIAGNOSIS — Z7982 Long term (current) use of aspirin: Secondary | ICD-10-CM | POA: Diagnosis not present

## 2014-09-22 DIAGNOSIS — Z87891 Personal history of nicotine dependence: Secondary | ICD-10-CM | POA: Diagnosis not present

## 2014-09-22 DIAGNOSIS — R1084 Generalized abdominal pain: Secondary | ICD-10-CM | POA: Insufficient documentation

## 2014-09-22 DIAGNOSIS — Z794 Long term (current) use of insulin: Secondary | ICD-10-CM | POA: Diagnosis not present

## 2014-09-22 DIAGNOSIS — R112 Nausea with vomiting, unspecified: Secondary | ICD-10-CM | POA: Insufficient documentation

## 2014-09-22 DIAGNOSIS — Z792 Long term (current) use of antibiotics: Secondary | ICD-10-CM | POA: Insufficient documentation

## 2014-09-22 DIAGNOSIS — E860 Dehydration: Secondary | ICD-10-CM | POA: Diagnosis not present

## 2014-09-22 DIAGNOSIS — E785 Hyperlipidemia, unspecified: Secondary | ICD-10-CM | POA: Insufficient documentation

## 2014-09-22 DIAGNOSIS — I252 Old myocardial infarction: Secondary | ICD-10-CM | POA: Diagnosis not present

## 2014-09-22 DIAGNOSIS — R197 Diarrhea, unspecified: Secondary | ICD-10-CM | POA: Diagnosis not present

## 2014-09-22 DIAGNOSIS — E119 Type 2 diabetes mellitus without complications: Secondary | ICD-10-CM | POA: Diagnosis not present

## 2014-09-22 DIAGNOSIS — Z79899 Other long term (current) drug therapy: Secondary | ICD-10-CM | POA: Diagnosis not present

## 2014-09-22 LAB — COMPREHENSIVE METABOLIC PANEL
ALT: 25 U/L (ref 17–63)
ANION GAP: 10 (ref 5–15)
AST: 21 U/L (ref 15–41)
Albumin: 2.8 g/dL — ABNORMAL LOW (ref 3.5–5.0)
Alkaline Phosphatase: 78 U/L (ref 38–126)
BUN: 25 mg/dL — AB (ref 6–20)
CHLORIDE: 111 mmol/L (ref 101–111)
CO2: 17 mmol/L — ABNORMAL LOW (ref 22–32)
CREATININE: 2.07 mg/dL — AB (ref 0.61–1.24)
Calcium: 8.2 mg/dL — ABNORMAL LOW (ref 8.9–10.3)
GFR calc Af Amer: 37 mL/min — ABNORMAL LOW (ref >60)
GFR, EST NON AFRICAN AMERICAN: 32 mL/min — AB (ref >60)
Glucose, Bld: 189 mg/dL — ABNORMAL HIGH (ref 65–99)
POTASSIUM: 4.2 mmol/L (ref 3.5–5.1)
SODIUM: 138 mmol/L (ref 135–145)
Total Bilirubin: 0.6 mg/dL (ref 0.3–1.2)
Total Protein: 6.1 g/dL — ABNORMAL LOW (ref 6.5–8.1)

## 2014-09-22 LAB — CBC WITH DIFFERENTIAL/PLATELET
BASOS PCT: 0 % (ref 0–1)
Basophils Absolute: 0 10*3/uL (ref 0.0–0.1)
Eosinophils Absolute: 1 10*3/uL — ABNORMAL HIGH (ref 0.0–0.7)
Eosinophils Relative: 10 % — ABNORMAL HIGH (ref 0–5)
HCT: 48.9 % (ref 39.0–52.0)
Hemoglobin: 16.3 g/dL (ref 13.0–17.0)
Lymphocytes Relative: 23 % (ref 12–46)
Lymphs Abs: 2.4 10*3/uL (ref 0.7–4.0)
MCH: 26.2 pg (ref 26.0–34.0)
MCHC: 33.3 g/dL (ref 30.0–36.0)
MCV: 78.7 fL (ref 78.0–100.0)
MONO ABS: 1.4 10*3/uL — AB (ref 0.1–1.0)
Monocytes Relative: 13 % — ABNORMAL HIGH (ref 3–12)
Neutro Abs: 5.6 10*3/uL (ref 1.7–7.7)
Neutrophils Relative %: 54 % (ref 43–77)
Platelets: 242 10*3/uL (ref 150–400)
RBC: 6.21 MIL/uL — ABNORMAL HIGH (ref 4.22–5.81)
RDW: 14.9 % (ref 11.5–15.5)
WBC: 10.4 10*3/uL (ref 4.0–10.5)

## 2014-09-22 LAB — LIPASE, BLOOD: Lipase: 14 U/L — ABNORMAL LOW (ref 22–51)

## 2014-09-22 LAB — I-STAT TROPONIN, ED: TROPONIN I, POC: 0 ng/mL (ref 0.00–0.08)

## 2014-09-22 LAB — CBG MONITORING, ED: GLUCOSE-CAPILLARY: 160 mg/dL — AB (ref 65–99)

## 2014-09-22 MED ORDER — ONDANSETRON HCL 4 MG/2ML IJ SOLN
4.0000 mg | Freq: Once | INTRAMUSCULAR | Status: AC
  Administered 2014-09-22: 4 mg via INTRAVENOUS
  Filled 2014-09-22: qty 2

## 2014-09-22 MED ORDER — SODIUM CHLORIDE 0.9 % IV BOLUS (SEPSIS)
1000.0000 mL | Freq: Once | INTRAVENOUS | Status: AC
  Administered 2014-09-22: 1000 mL via INTRAVENOUS

## 2014-09-22 NOTE — ED Notes (Signed)
Lab called, some blood is hemolyzed. Plbt at bedside redrawing.

## 2014-09-22 NOTE — ED Notes (Signed)
Pt had CT at [facility]

## 2014-09-22 NOTE — Telephone Encounter (Signed)
CM spoke with MD who had visually confirmed pt had at least half a bottle of Zofran when he left the ED. MD  Willing to write another Rx for ODT Zofran but unnecessary. I explained this to family and encouraged them to use the Zofran that they had on hand instead of purchasing the more expensive ODT RX. Family verbalized understanding and appreciation.

## 2014-09-22 NOTE — ED Provider Notes (Signed)
CSN: [id number]     Arrival date & time 09/22/14  2505 History   First MD Initiated Contact with Patient 09/22/14 0820     Chief Complaint  Patient presents with  . Nausea  . Vomiting  . Diarrhea  . Abdominal Pain     (Consider location/radiation/quality/duration/timing/severity/associated sxs/prior Treatment) Patient is a 64 y.o. male presenting with abdominal pain.  Abdominal Pain Pain location:  Generalized Pain quality: cramping   Pain radiates to:  Does not radiate Pain severity:  Moderate Onset quality:  Gradual Duration:  1 week Timing:  Constant Progression:  Unchanged Chronicity:  New Context: not suspicious food intake   Context comment:  Seen for same at [facility] with reportedly negative CT, started on cipro, flagyl Relieved by:  Nothing Worsened by:  Nothing tried Associated symptoms: chills, diarrhea, nausea and vomiting   Associated symptoms: no dysuria and no fever     Past Medical History  Diagnosis Date  . Diabetes mellitus     insulin-dependent  . HTN (hypertension)   . Dyslipidemia   . Chest pain   . Post MI syndrome 08-29-2009  . CAD (coronary artery disease)     nonobstructive CAD/patent LAD stent site; EF 65%, 08/2011; NSTEMI/DES LAD, 6/20 11   History reviewed. No pertinent past surgical history. Family History  Problem Relation Age of Onset  . Hypertension Other     in all family members   History  Substance Use Topics  . Smoking status: Former Smoker -- 0.50 packs/day for 23 years    Types: Cigarettes    Start date: 03/16/1955    Quit date: 03/15/1988  . Smokeless tobacco: Never Used  . Alcohol Use: No    Review of Systems  Constitutional: Positive for chills. Negative for fever.  Gastrointestinal: Positive for nausea, vomiting, abdominal pain and diarrhea.  Genitourinary: Negative for dysuria.  All other systems reviewed and are negative.     Allergies  Shrimp  Home Medications   Prior to Admission medications    Medication Sig Start Date End Date Taking? Authorizing Provider  amLODipine (NORVASC) 10 MG tablet Take 10 mg by mouth daily.     Yes Historical Provider, MD  aspirin 325 MG tablet Take 325 mg by mouth daily.   Yes Historical Provider, MD  atorvastatin (LIPITOR) 80 MG tablet Take 1 tablet (80 mg total) by mouth every evening. 03/04/14  Yes Herminio Commons, MD  benazepril (LOTENSIN) 40 MG tablet Take 40 mg by mouth 2 (two) times daily.    Yes Historical Provider, MD  carvedilol (COREG) 25 MG tablet Take 1 tablet (25 mg total) by mouth 2 (two) times daily. 11/29/13  Yes Herminio Commons, MD  ciprofloxacin (CIPRO) 250 MG tablet Take 250 mg by mouth 2 (two) times daily.   Yes Historical Provider, MD  Dapagliflozin Propanediol (FARXIGA) 10 MG TABS Take 10 mg by mouth daily.   Yes Historical Provider, MD  ezetimibe (ZETIA) 10 MG tablet Take 10 mg by mouth daily.   Yes Historical Provider, MD  insulin detemir (LEVEMIR) 100 UNIT/ML injection Inject 80 Units into the skin at bedtime.   Yes Historical Provider, MD  Insulin Glulisine (APIDRA [place]) Inject 25-27 Units into the skin as directed. 90-150 (25 units) 151-250 (26 units) 251-300 (27 units)   Yes Historical Provider, MD  isosorbide mononitrate (IMDUR) 30 MG 24 hr tablet TAKE 1 TABLET BY MOUTH DAILY 05/03/13  Yes Herminio Commons, MD  metFORMIN (GLUCOPHAGE) 1000 MG tablet TAKE 1 TABLET  TWICE A DAY FOR DIABETES 07/21/14  Yes Historical Provider, MD  metroNIDAZOLE (FLAGYL) 500 MG tablet Take 2,000 mg by mouth 3 (three) times daily.   Yes Historical Provider, MD  nitroGLYCERIN (NITROSTAT) 0.4 MG SL tablet Place 1 tablet (0.4 mg total) under the tongue every 5 (five) minutes as needed. 08/30/11  Yes Ezra Sites, MD  ondansetron (ZOFRAN) 4 MG tablet Take 4 mg by mouth every 6 (six) hours as needed for nausea or vomiting.   Yes Historical Provider, MD   BP 132/69 mmHg  Pulse 74  Temp(Src) 98 F (36.7 C) (Oral)  Resp 18  Ht 6\' 3"  (1.905 m)  SpO2  98% Physical Exam  Constitutional: He is oriented to person, place, and time. He appears well-developed and well-nourished.  HENT:  Head: Normocephalic and atraumatic.  Eyes: Conjunctivae and EOM are normal.  Neck: Normal range of motion. Neck supple.  Cardiovascular: Normal rate, regular rhythm and normal heart sounds.   Pulmonary/Chest: Effort normal and breath sounds normal. No respiratory distress.  Abdominal: He exhibits no distension. There is no tenderness. There is no rebound and no guarding.  Musculoskeletal: Normal range of motion.  Neurological: He is alert and oriented to person, place, and time.  Skin: Skin is warm and dry.  Vitals reviewed.   ED Course  Procedures (including critical care time) Labs Review Labs Reviewed  CBC WITH DIFFERENTIAL/PLATELET - Abnormal; Notable for the following:    RBC 6.21 (*)    Monocytes Relative 13 (*)    Eosinophils Relative 10 (*)    Monocytes Absolute 1.4 (*)    Eosinophils Absolute 1.0 (*)    All other components within normal limits  COMPREHENSIVE METABOLIC PANEL - Abnormal; Notable for the following:    CO2 17 (*)    Glucose, Bld 189 (*)    BUN 25 (*)    Creatinine, Ser 2.07 (*)    Calcium 8.2 (*)    Total Protein 6.1 (*)    Albumin 2.8 (*)    GFR calc non Af Amer 32 (*)    GFR calc Af Amer 37 (*)    All other components within normal limits  LIPASE, BLOOD - Abnormal; Notable for the following:    Lipase 14 (*)    All other components within normal limits  CBG MONITORING, ED - Abnormal; Notable for the following:    Glucose-Capillary 160 (*)    All other components within normal limits  I-STAT TROPOININ, ED  I-STAT TROPOININ, ED    Imaging Review No results found.   EKG Interpretation   Date/Time:  Sunday September 22 2014 08:59:37 EDT Ventricular Rate:  77 PR Interval:  166 QRS Duration: 94 QT Interval:  418 QTC Calculation: 473 R Axis:   45 Text Interpretation:  Sinus rhythm Low voltage, precordial leads   Borderline T wave abnormalities Baseline wander in lead(s) V5 V6 T wave  inversions in lateral leads improved since last tracing Confirmed by  Debby Freiberg [phone]) on 09/22/2014 11:19:46 AM      MDM   Final diagnoses:  Non-intractable vomiting with nausea, vomiting of unspecified type  Diarrhea  Dehydration    64 y.o. male with pertinent PMH of DM, CAD presents with N/V/d as above.  Physical exam on arrival benign, but pt actively vomiting.  No abd tenderness.  Pt was started on cipro and flagyl at OSH empirically for diverticulitis.  Wu as above, remarkable for AKI.  Likely dehydration induced.  Discussed with pt at  length, who after 8 mg zofran is now without nausea or symptoms.  Offered admission, however pt elected to return home with close PCP fu.  PO challenge successful.  Discussed strict return precautions.  DC home in stable condtiion  I have reviewed all laboratory and imaging studies if ordered as above  1. Non-intractable vomiting with nausea, vomiting of unspecified type   2. Diarrhea   3. Dehydration         Debby Freiberg, MD 09/22/14 1520

## 2014-09-22 NOTE — ED Notes (Signed)
Ambulated patient to wheelchair, then from wheelchair to bathroom. Pt noted to sway and run into the wall multiple times even when I was holding onto him. Pt reported being dizzy. MD informed.

## 2014-09-22 NOTE — Discharge Instructions (Signed)

## 2014-09-22 NOTE — ED Notes (Signed)
PT reports nausea vomiting diarrhea for a week. Was seen at [facility] Thursday and given nausea meds and antibiotics, has been taking meds with no relief. Reports 4X emesis & 4 episodes of diarrhea last 24 hours. Pt reports 14 lb weight loss this week.

## 2014-10-11 ENCOUNTER — Telehealth: Payer: Self-pay | Admitting: Cardiovascular Disease

## 2014-10-11 NOTE — Telephone Encounter (Signed)
Patient walked in to office wanting Dr Bronson Ing to fill out form so that he can receive a handicap plate/sticker. He did not have the form.

## 2014-10-11 NOTE — Telephone Encounter (Signed)
We can print form off DMV site.  Will forward to provider for approval before obtaining.

## 2014-10-13 NOTE — Telephone Encounter (Signed)
No cardiac reason for a handicap sticker. Defer to PCP.

## 2014-10-14 NOTE — Telephone Encounter (Signed)
Wife Alex Cortez) notified.

## 2014-11-19 ENCOUNTER — Other Ambulatory Visit: Payer: Self-pay | Admitting: *Deleted

## 2014-11-19 MED ORDER — CARVEDILOL 25 MG PO TABS: 25.0000 mg | ORAL_TABLET | Freq: Two times a day (BID) | ORAL | Status: DC

## 2014-11-26 DIAGNOSIS — I10 Essential (primary) hypertension: Secondary | ICD-10-CM | POA: Diagnosis not present

## 2014-11-26 DIAGNOSIS — E785 Hyperlipidemia, unspecified: Secondary | ICD-10-CM | POA: Diagnosis not present

## 2014-11-26 DIAGNOSIS — E1165 Type 2 diabetes mellitus with hyperglycemia: Secondary | ICD-10-CM | POA: Diagnosis not present

## 2014-12-03 DIAGNOSIS — E785 Hyperlipidemia, unspecified: Secondary | ICD-10-CM | POA: Diagnosis not present

## 2014-12-03 DIAGNOSIS — I10 Essential (primary) hypertension: Secondary | ICD-10-CM | POA: Diagnosis not present

## 2014-12-03 DIAGNOSIS — E1159 Type 2 diabetes mellitus with other circulatory complications: Secondary | ICD-10-CM | POA: Diagnosis not present

## 2014-12-24 ENCOUNTER — Telehealth: Payer: Self-pay

## 2014-12-24 NOTE — Telephone Encounter (Signed)
Advice to increase Apidra to 30 units 3 times a day before meals plus correction.

## 2014-12-24 NOTE — Telephone Encounter (Signed)
Pt states that he has had high BG readings.   10/8    291  273  215  341 10/9    276  195  314  257 10/10  271  250   Pt is taking:   Levemir 80units qhs   Apidra 25 TIDAC + SSI   MTF 1000mg  bid

## 2014-12-24 NOTE — Telephone Encounter (Signed)
Pt notified and agrees. 

## 2015-01-01 ENCOUNTER — Telehealth: Payer: Self-pay | Admitting: "Endocrinology

## 2015-01-01 NOTE — Telephone Encounter (Signed)
calling about medicine being sent to [facility] and being charged [facility]100

## 2015-01-03 ENCOUNTER — Telehealth: Payer: Self-pay | Admitting: "Endocrinology

## 2015-01-03 NOTE — Telephone Encounter (Signed)
I spoke with Alex Cortez and explained to him that even though it was a mistake to receive his [name] from [facility] there was no way to return the medicine back because of a law. Therefore, I called [facility] and spoke with Alex Cortez and Alex Cortez; Alex Cortez said they could give him a credit back due to a "Bulk Up". It is a once in a life time thing and Alex Cortez understands and is going to call [facility] to get them to credit him.  He was very understanding and appreciated we called on his behave. We have made sure that [facility].

## 2015-01-16 ENCOUNTER — Other Ambulatory Visit: Payer: Self-pay

## 2015-01-16 MED ORDER — GLUCOSE BLOOD VI STRP: ORAL_STRIP | Status: DC

## 2015-01-20 ENCOUNTER — Other Ambulatory Visit: Payer: Self-pay

## 2015-01-20 MED ORDER — GLUCOSE BLOOD VI STRP: ORAL_STRIP | Status: DC

## 2015-01-23 ENCOUNTER — Other Ambulatory Visit: Payer: Self-pay | Admitting: "Endocrinology

## 2015-02-13 DIAGNOSIS — M25562 Pain in left knee: Secondary | ICD-10-CM | POA: Diagnosis not present

## 2015-02-17 DIAGNOSIS — E785 Hyperlipidemia, unspecified: Secondary | ICD-10-CM | POA: Diagnosis not present

## 2015-02-17 DIAGNOSIS — I10 Essential (primary) hypertension: Secondary | ICD-10-CM | POA: Diagnosis not present

## 2015-02-17 DIAGNOSIS — E1165 Type 2 diabetes mellitus with hyperglycemia: Secondary | ICD-10-CM | POA: Diagnosis not present

## 2015-02-17 LAB — HEMOGLOBIN A1C: HEMOGLOBIN A1C: 9.9 % — AB (ref 4.0–6.0)

## 2015-02-24 ENCOUNTER — Ambulatory Visit (INDEPENDENT_AMBULATORY_CARE_PROVIDER_SITE_OTHER): Payer: Medicare Other | Admitting: "Endocrinology

## 2015-02-24 ENCOUNTER — Encounter: Payer: Self-pay | Admitting: "Endocrinology

## 2015-02-24 VITALS — BP 127/69 | HR 81 | Ht 74.0 in | Wt 261.0 lb

## 2015-02-24 DIAGNOSIS — I2510 Atherosclerotic heart disease of native coronary artery without angina pectoris: Secondary | ICD-10-CM | POA: Diagnosis not present

## 2015-02-24 DIAGNOSIS — E1159 Type 2 diabetes mellitus with other circulatory complications: Secondary | ICD-10-CM

## 2015-02-24 DIAGNOSIS — I10 Essential (primary) hypertension: Secondary | ICD-10-CM

## 2015-02-24 DIAGNOSIS — E785 Hyperlipidemia, unspecified: Secondary | ICD-10-CM

## 2015-02-24 DIAGNOSIS — E782 Mixed hyperlipidemia: Secondary | ICD-10-CM | POA: Insufficient documentation

## 2015-02-24 MED ORDER — INSULIN DETEMIR 100 UNIT/ML FLEXPEN
80.0000 [IU] | PEN_INJECTOR | Freq: Every day | SUBCUTANEOUS | Status: DC
Start: 2015-02-24 — End: 2016-02-25

## 2015-02-24 MED ORDER — INSULIN GLULISINE 100 UNIT/ML SOLOSTAR PEN: 30.0000 [IU] | PEN_INJECTOR | Freq: Three times a day (TID) | SUBCUTANEOUS | Status: DC

## 2015-02-24 NOTE — Progress Notes (Signed)
Subjective:    Patient ID: Alex Cortez, male    DOB: 04-11-1950, PCP Curlene Labrum, MD   Past Medical History  Diagnosis Date  . Diabetes mellitus     insulin-dependent  . HTN (hypertension)   . Dyslipidemia   . Chest pain   . Post MI syndrome ([place]) 08-29-2009  . CAD (coronary artery disease)     nonobstructive CAD/patent LAD stent site; EF 65%, 08/2011; NSTEMI/DES LAD, 6/20 11   Past Surgical History  Procedure Laterality Date  . Coronary angioplasty with stent placement     Social History   Social History  . Marital Status: Married    Spouse Name: N/A  . Number of Children: N/A  . Years of Education: N/A   Social History Main Topics  . Smoking status: Former Smoker -- 0.50 packs/day for 23 years    Types: Cigarettes    Start date: 03/16/1955    Quit date: 03/15/1988  . Smokeless tobacco: Never Used  . Alcohol Use: No  . Drug Use: No  . Sexual Activity: Not Asked   Other Topics Concern  . None   Social History Narrative   Works at [employer]. Married.    Outpatient Encounter Prescriptions as of 02/24/2015  Medication Sig  . amLODipine (NORVASC) 10 MG tablet Take 10 mg by mouth daily.    Marland Kitchen aspirin 325 MG tablet Take 325 mg by mouth daily.  Marland Kitchen atorvastatin (LIPITOR) 80 MG tablet Take 1 tablet (80 mg total) by mouth every evening.  . benazepril (LOTENSIN) 40 MG tablet Take 40 mg by mouth 2 (two) times daily.   . carvedilol (COREG) 25 MG tablet Take 1 tablet (25 mg total) by mouth 2 (two) times daily.  . Dapagliflozin Propanediol (FARXIGA) 10 MG TABS Take 10 mg by mouth daily.  . Insulin Detemir (LEVEMIR FLEXTOUCH) 100 UNIT/ML Pen Inject 80 Units into the skin daily at 10 pm.  . Insulin Glulisine (APIDRA [place]) 100 UNIT/ML Solostar Pen Inject 30-36 Units into the skin 3 (three) times daily with meals.  . isosorbide mononitrate (IMDUR) 30 MG 24 hr tablet TAKE 1 TABLET BY MOUTH DAILY  . metFORMIN (GLUCOPHAGE) 1000 MG tablet TAKE 1 TABLET TWICE A DAY FOR  DIABETES  . [DISCONTINUED] Insulin Glulisine (APIDRA SOLOSTAR) 100 UNIT/ML Solostar Pen Inject 30-36 Units into the skin.  . [DISCONTINUED] LEVEMIR FLEXTOUCH 100 UNIT/ML Pen INJECT 80 UNITS AT BEDTIME  . ciprofloxacin (CIPRO) 250 MG tablet Take 250 mg by mouth 2 (two) times daily.  Marland Kitchen ezetimibe (ZETIA) 10 MG tablet Take 10 mg by mouth daily.  Marland Kitchen glucose blood test strip Use as instructed 4 x daily  Dx:E11.65  . metroNIDAZOLE (FLAGYL) 500 MG tablet Take 2,000 mg by mouth 3 (three) times daily.  . nitroGLYCERIN (NITROSTAT) 0.4 MG SL tablet Place 1 tablet (0.4 mg total) under the tongue every 5 (five) minutes as needed.  . ondansetron (ZOFRAN) 4 MG tablet Take 4 mg by mouth every 6 (six) hours as needed for nausea or vomiting.  . [DISCONTINUED] Insulin Glulisine (APIDRA [place]) Inject 25-27 Units into the skin as directed. 90-150 (25 units) 151-250 (26 units) 251-300 (27 units)   No facility-administered encounter medications on file as of 02/24/2015.   ALLERGIES: Allergies  Allergen Reactions  . Shrimp [Shellfish Allergy]     Swelling and hives    VACCINATION STATUS:  There is no immunization history on file for this patient.  Diabetes He presents for his follow-up diabetic visit. He  has type 2 diabetes mellitus. Onset time: He was diagnosed at approximate age of 77 years. His disease course has been worsening. There are no hypoglycemic associated symptoms. Pertinent negatives for hypoglycemia include no confusion, headaches, pallor or seizures. Associated symptoms include polydipsia and polyuria. Pertinent negatives for diabetes include no chest pain, no fatigue, no polyphagia and no weakness. There are no hypoglycemic complications. Symptoms are worsening. Diabetic complications include a CVA. Risk factors for coronary artery disease include diabetes mellitus, dyslipidemia, hypertension, male sex, obesity and sedentary lifestyle. Current diabetic treatment includes intensive insulin program. He is  compliant with treatment most of the time. His weight is increasing steadily. He is following a generally unhealthy diet. When asked about meal planning, he reported none. He has not had a previous visit with a dietitian (He declined referral to a CDE for diabetes education.). His breakfast blood glucose range is generally 180-200 mg/dl. His lunch blood glucose range is generally 180-200 mg/dl. His dinner blood glucose range is generally 180-200 mg/dl. His overall blood glucose range is 180-200 mg/dl. An ACE inhibitor/angiotensin II receptor blocker is being taken. Eye exam is current.  Hyperlipidemia This is a chronic problem. The current episode started more than 1 year ago. Exacerbating diseases include diabetes and obesity. Pertinent negatives include no chest pain, leg pain, myalgias or shortness of breath. Current antihyperlipidemic treatment includes statins. Risk factors for coronary artery disease include diabetes mellitus, dyslipidemia, hypertension, male sex, obesity and a sedentary lifestyle.  Hypertension This is a chronic problem. The current episode started more than 1 year ago. The problem is controlled. Pertinent negatives include no chest pain, headaches, neck pain, palpitations or shortness of breath. Past treatments include ACE inhibitors. Hypertensive end-organ damage includes CVA.     Review of Systems  Constitutional: Negative for fatigue and unexpected weight change.  HENT: Negative for dental problem, mouth sores and trouble swallowing.   Eyes: Negative for visual disturbance.  Respiratory: Negative for cough, choking, chest tightness, shortness of breath and wheezing.   Cardiovascular: Negative for chest pain, palpitations and leg swelling.  Gastrointestinal: Negative for nausea, vomiting, abdominal pain, diarrhea, constipation and abdominal distention.  Endocrine: Positive for polydipsia and polyuria. Negative for polyphagia.  Genitourinary: Negative for dysuria, urgency,  hematuria and flank pain.  Musculoskeletal: Negative for myalgias, back pain, gait problem and neck pain.  Skin: Negative for pallor, rash and wound.  Neurological: Negative for seizures, syncope, weakness, numbness and headaches.  Psychiatric/Behavioral: Negative.  Negative for confusion and dysphoric mood.    Objective:    BP 127/69 mmHg  Pulse 81  Ht 6\' 2"  (1.88 m)  Wt 261 lb (118.389 kg)  BMI 33.50 kg/m2  SpO2 95%  Wt Readings from Last 3 Encounters:  02/24/15 261 lb (118.389 kg)  04/23/14 257 lb (116.574 kg)  10/25/13 257 lb (116.574 kg)    Physical Exam  Constitutional: He is oriented to person, place, and time. He appears well-developed and well-nourished. He is cooperative. No distress.  HENT:  Head: Normocephalic and atraumatic.  Eyes: EOM are normal.  Neck: Normal range of motion. Neck supple. No tracheal deviation present. No thyromegaly present.  Cardiovascular: Normal rate, S1 normal, S2 normal and normal heart sounds.  Exam reveals no gallop.   No murmur heard. Pulses:      Dorsalis pedis pulses are 1+ on the right side, and 1+ on the left side.       Posterior tibial pulses are 1+ on the right side, and 1+ on the left  side.  Pulmonary/Chest: Breath sounds normal. No respiratory distress. He has no wheezes.  Abdominal: Soft. Bowel sounds are normal. He exhibits no distension. There is no tenderness. There is no guarding and no CVA tenderness.  Musculoskeletal: He exhibits no edema.       Right shoulder: He exhibits no swelling and no deformity.  Neurological: He is alert and oriented to person, place, and time. He has normal strength and normal reflexes. No cranial nerve deficit or sensory deficit. Gait normal.  Skin: Skin is warm and dry. No rash noted. No cyanosis. Nails show no clubbing.  Psychiatric: He has a normal mood and affect. His speech is normal and behavior is normal. Judgment and thought content normal. Cognition and memory are normal.    Results  for orders placed or performed in visit on 02/24/15  Hemoglobin A1c  Result Value Ref Range   Hgb A1c MFr Bld 9.9 (A) 4.0 - 6.0 %   Complete Blood Count (Most recent): Lab Results  Component Value Date   WBC 10.4 09/22/2014   HGB 16.3 09/22/2014   HCT 48.9 09/22/2014   MCV 78.7 09/22/2014   PLT 242 09/22/2014   Chemistry (most recent): Lab Results  Component Value Date   NA 138 09/22/2014   K 4.2 09/22/2014   CL 111 09/22/2014   CO2 17* 09/22/2014   BUN 25* 09/22/2014   CREATININE 2.07* 09/22/2014     Assessment & Plan:   1. Type 2 diabetes mellitus with vascular disease (HCC)   -His  diabetes is  complicated by CVA and patient remains at a high risk for more acute and chronic complications of diabetes which include CAD, CVA, CKD, retinopathy, and neuropathy. These are all discussed in detail with the patient.  Patient came with persistently above target glucose profile, and  recent A1c of 9.9 %.  Glucose logs and insulin administration records pertaining to this visit,  to be scanned into patient's records.  Recent labs reviewed.   - I have re-counseled the patient on diet management and weight loss  by adopting a carbohydrate restricted / protein rich  Diet.  - Suggestion is made for patient to avoid simple carbohydrates   from their diet including Cakes , Desserts, Ice Cream,  Soda (  diet and regular) , Sweet Tea , Candies,  Chips, Cookies, Artificial Sweeteners,   and "Sugar-free" Products .  This will help patient to have stable blood glucose profile and potentially avoid unintended  Weight gain.  - Patient is advised to stick to a routine mealtimes to eat 3 meals  a day and avoid unnecessary snacks ( to snack only to correct hypoglycemia).  - The patient  declined referral to CDE for individualized DM education.  - I have approached patient with the following individualized plan to manage diabetes and patient agrees.  -Continue Levemir 80 units daily at bedtime,  Apidra 30 units 3 times a day before meals, associated with monitoring of blood glucose before meals and at bedtime. Continue Farxiga 10 mg by mouth every morning. Continue metformin 1000 mg by mouth twice a day. Patient is advised to call clinic if he registers blood glucose less than 70 mg/dL or greater than 300 mg/dL.   - Patient will be considered for incretin therapy as appropriate next visit. - Patient specific target  for A1c; LDL, HDL, Triglycerides, and  Waist Circumference were discussed in detail.  2) BP/HTN:controlled. Continue current medications including ACEI/ARB. 3) Lipids/HPL:  continue statins. 4)  Weight/Diet:  CDE consult in progress, exercise, and carbohydrates information provided.  5) Chronic Care/Health Maintenance:  -Patient  Is  on ACEI/ARB and Statin medications and encouraged to continue to follow up with Ophthalmology, Podiatrist at least yearly or according to recommendations, and advised to  stay away from smoking. I have recommended yearly flu vaccine and pneumonia vaccination at least every 5 years; moderate intensity exercise for up to 150 minutes weekly; and  sleep for at least 7 hours a day.  - 25 minutes of time was spent on the care of this patient , 50% of which was applied for counseling on diabetes complications and their preventions.  - I advised patient to maintain close follow up with Curlene Labrum, MD for primary care needs.  Patient is asked to bring meter and  blood glucose logs during their next visit.   Follow up plan: -Return in about 3 months (around 05/25/2015) for diabetes, high blood pressure, high cholesterol, follow up with pre-visit labs, meter, and logs.  Glade Lloyd, MD Phone: [phone]  Fax: [phone]   02/24/2015, 3:32 PM

## 2015-02-24 NOTE — Patient Instructions (Signed)

## 2015-02-26 ENCOUNTER — Other Ambulatory Visit: Payer: Self-pay | Admitting: *Deleted

## 2015-02-26 MED ORDER — ATORVASTATIN CALCIUM 80 MG PO TABS: 80.0000 mg | ORAL_TABLET | Freq: Every evening | ORAL | Status: DC

## 2015-02-27 DIAGNOSIS — E1165 Type 2 diabetes mellitus with hyperglycemia: Secondary | ICD-10-CM | POA: Diagnosis not present

## 2015-02-27 DIAGNOSIS — E782 Mixed hyperlipidemia: Secondary | ICD-10-CM | POA: Diagnosis not present

## 2015-02-27 DIAGNOSIS — I240 Acute coronary thrombosis not resulting in myocardial infarction: Secondary | ICD-10-CM | POA: Diagnosis not present

## 2015-02-27 DIAGNOSIS — D126 Benign neoplasm of colon, unspecified: Secondary | ICD-10-CM | POA: Diagnosis not present

## 2015-02-27 DIAGNOSIS — M25562 Pain in left knee: Secondary | ICD-10-CM | POA: Diagnosis not present

## 2015-02-27 DIAGNOSIS — I10 Essential (primary) hypertension: Secondary | ICD-10-CM | POA: Diagnosis not present

## 2015-02-27 DIAGNOSIS — M23322 Other meniscus derangements, posterior horn of medial meniscus, left knee: Secondary | ICD-10-CM | POA: Diagnosis not present

## 2015-02-27 DIAGNOSIS — N410 Acute prostatitis: Secondary | ICD-10-CM | POA: Diagnosis not present

## 2015-02-27 DIAGNOSIS — M94262 Chondromalacia, left knee: Secondary | ICD-10-CM | POA: Diagnosis not present

## 2015-03-03 DIAGNOSIS — S83242A Other tear of medial meniscus, current injury, left knee, initial encounter: Secondary | ICD-10-CM | POA: Diagnosis not present

## 2015-03-03 DIAGNOSIS — M179 Osteoarthritis of knee, unspecified: Secondary | ICD-10-CM | POA: Diagnosis not present

## 2015-03-06 DIAGNOSIS — E782 Mixed hyperlipidemia: Secondary | ICD-10-CM | POA: Diagnosis not present

## 2015-03-06 DIAGNOSIS — D126 Benign neoplasm of colon, unspecified: Secondary | ICD-10-CM | POA: Diagnosis not present

## 2015-03-06 DIAGNOSIS — I10 Essential (primary) hypertension: Secondary | ICD-10-CM | POA: Diagnosis not present

## 2015-03-06 DIAGNOSIS — A045 Campylobacter enteritis: Secondary | ICD-10-CM | POA: Diagnosis not present

## 2015-03-06 DIAGNOSIS — E1165 Type 2 diabetes mellitus with hyperglycemia: Secondary | ICD-10-CM | POA: Diagnosis not present

## 2015-03-06 DIAGNOSIS — R0600 Dyspnea, unspecified: Secondary | ICD-10-CM | POA: Diagnosis not present

## 2015-03-06 DIAGNOSIS — Z1389 Encounter for screening for other disorder: Secondary | ICD-10-CM | POA: Diagnosis not present

## 2015-03-06 DIAGNOSIS — Z0001 Encounter for general adult medical examination with abnormal findings: Secondary | ICD-10-CM | POA: Diagnosis not present

## 2015-03-06 DIAGNOSIS — J302 Other seasonal allergic rhinitis: Secondary | ICD-10-CM | POA: Diagnosis not present

## 2015-03-06 DIAGNOSIS — I240 Acute coronary thrombosis not resulting in myocardial infarction: Secondary | ICD-10-CM | POA: Diagnosis not present

## 2015-03-06 DIAGNOSIS — I6350 Cerebral infarction due to unspecified occlusion or stenosis of unspecified cerebral artery: Secondary | ICD-10-CM | POA: Diagnosis not present

## 2015-03-06 DIAGNOSIS — F5221 Male erectile disorder: Secondary | ICD-10-CM | POA: Diagnosis not present

## 2015-03-11 DIAGNOSIS — R931 Abnormal findings on diagnostic imaging of heart and coronary circulation: Secondary | ICD-10-CM | POA: Diagnosis not present

## 2015-03-11 DIAGNOSIS — R0602 Shortness of breath: Secondary | ICD-10-CM | POA: Diagnosis not present

## 2015-03-20 DIAGNOSIS — M94262 Chondromalacia, left knee: Secondary | ICD-10-CM | POA: Diagnosis not present

## 2015-03-20 DIAGNOSIS — M23322 Other meniscus derangements, posterior horn of medial meniscus, left knee: Secondary | ICD-10-CM | POA: Diagnosis not present

## 2015-03-20 DIAGNOSIS — M25562 Pain in left knee: Secondary | ICD-10-CM | POA: Diagnosis not present

## 2015-03-23 ENCOUNTER — Other Ambulatory Visit: Payer: Self-pay | Admitting: "Endocrinology

## 2015-04-11 ENCOUNTER — Telehealth: Payer: Self-pay | Admitting: *Deleted

## 2015-04-11 NOTE — Telephone Encounter (Signed)
Available records sent.

## 2015-04-14 DIAGNOSIS — M23322 Other meniscus derangements, posterior horn of medial meniscus, left knee: Secondary | ICD-10-CM | POA: Diagnosis not present

## 2015-04-14 DIAGNOSIS — Y929 Unspecified place or not applicable: Secondary | ICD-10-CM | POA: Diagnosis not present

## 2015-04-14 DIAGNOSIS — M6752 Plica syndrome, left knee: Secondary | ICD-10-CM | POA: Diagnosis not present

## 2015-04-14 DIAGNOSIS — Y999 Unspecified external cause status: Secondary | ICD-10-CM | POA: Diagnosis not present

## 2015-04-14 DIAGNOSIS — G8918 Other acute postprocedural pain: Secondary | ICD-10-CM | POA: Diagnosis not present

## 2015-04-14 DIAGNOSIS — S83232A Complex tear of medial meniscus, current injury, left knee, initial encounter: Secondary | ICD-10-CM | POA: Diagnosis not present

## 2015-04-14 DIAGNOSIS — Y939 Activity, unspecified: Secondary | ICD-10-CM | POA: Diagnosis not present

## 2015-04-14 DIAGNOSIS — M94262 Chondromalacia, left knee: Secondary | ICD-10-CM | POA: Diagnosis not present

## 2015-04-29 DIAGNOSIS — R262 Difficulty in walking, not elsewhere classified: Secondary | ICD-10-CM | POA: Diagnosis not present

## 2015-04-29 DIAGNOSIS — M25562 Pain in left knee: Secondary | ICD-10-CM | POA: Diagnosis not present

## 2015-05-02 DIAGNOSIS — M25562 Pain in left knee: Secondary | ICD-10-CM | POA: Diagnosis not present

## 2015-05-02 DIAGNOSIS — R262 Difficulty in walking, not elsewhere classified: Secondary | ICD-10-CM | POA: Diagnosis not present

## 2015-05-06 DIAGNOSIS — R262 Difficulty in walking, not elsewhere classified: Secondary | ICD-10-CM | POA: Diagnosis not present

## 2015-05-06 DIAGNOSIS — M25562 Pain in left knee: Secondary | ICD-10-CM | POA: Diagnosis not present

## 2015-05-08 DIAGNOSIS — M25562 Pain in left knee: Secondary | ICD-10-CM | POA: Diagnosis not present

## 2015-05-08 DIAGNOSIS — R262 Difficulty in walking, not elsewhere classified: Secondary | ICD-10-CM | POA: Diagnosis not present

## 2015-05-22 ENCOUNTER — Other Ambulatory Visit: Payer: Self-pay | Admitting: "Endocrinology

## 2015-05-22 DIAGNOSIS — E1159 Type 2 diabetes mellitus with other circulatory complications: Secondary | ICD-10-CM | POA: Diagnosis not present

## 2015-05-22 LAB — BASIC METABOLIC PANEL
BUN: 14 mg/dL (ref 7–25)
CALCIUM: 9.4 mg/dL (ref 8.6–10.3)
CO2: 26 mmol/L (ref 20–31)
Chloride: 105 mmol/L (ref 98–110)
Creat: 0.98 mg/dL (ref 0.70–1.25)
GLUCOSE: 176 mg/dL — AB (ref 65–99)
Potassium: 4.5 mmol/L (ref 3.5–5.3)
Sodium: 140 mmol/L (ref 135–146)

## 2015-05-23 LAB — HEMOGLOBIN A1C
HEMOGLOBIN A1C: 9 % — AB (ref <5.7)
MEAN PLASMA GLUCOSE: 212 mg/dL — AB (ref <117)

## 2015-05-28 ENCOUNTER — Encounter: Payer: Self-pay | Admitting: "Endocrinology

## 2015-05-28 ENCOUNTER — Ambulatory Visit (INDEPENDENT_AMBULATORY_CARE_PROVIDER_SITE_OTHER): Payer: Medicare Other | Admitting: "Endocrinology

## 2015-05-28 VITALS — BP 123/71 | HR 82 | Ht 74.0 in | Wt 254.0 lb

## 2015-05-28 DIAGNOSIS — I10 Essential (primary) hypertension: Secondary | ICD-10-CM

## 2015-05-28 DIAGNOSIS — E785 Hyperlipidemia, unspecified: Secondary | ICD-10-CM | POA: Diagnosis not present

## 2015-05-28 DIAGNOSIS — E1159 Type 2 diabetes mellitus with other circulatory complications: Secondary | ICD-10-CM | POA: Diagnosis not present

## 2015-05-28 NOTE — Progress Notes (Signed)
Subjective:    Patient ID: Alex Cortez, male    DOB: November 20, 1950, PCP Curlene Labrum, MD   Past Medical History  Diagnosis Date  . Diabetes mellitus     insulin-dependent  . HTN (hypertension)   . Dyslipidemia   . Chest pain   . Post MI syndrome ([place]) 08-29-2009  . CAD (coronary artery disease)     nonobstructive CAD/patent LAD stent site; EF 65%, 08/2011; NSTEMI/DES LAD, 6/20 11   Past Surgical History  Procedure Laterality Date  . Coronary angioplasty with stent placement     Social History   Social History  . Marital Status: Married    Spouse Name: N/A  . Number of Children: N/A  . Years of Education: N/A   Social History Main Topics  . Smoking status: Former Smoker -- 0.50 packs/day for 23 years    Types: Cigarettes    Start date: 03/16/1955    Quit date: 03/15/1988  . Smokeless tobacco: Never Used  . Alcohol Use: No  . Drug Use: No  . Sexual Activity: Not Asked   Other Topics Concern  . None   Social History Narrative   Works at [employer]. Married.    Outpatient Encounter Prescriptions as of 05/28/2015  Medication Sig  . amLODipine (NORVASC) 10 MG tablet Take 10 mg by mouth daily.    Marland Kitchen aspirin 325 MG tablet Take 325 mg by mouth daily.  Marland Kitchen atorvastatin (LIPITOR) 80 MG tablet Take 1 tablet (80 mg total) by mouth every evening.  . benazepril (LOTENSIN) 40 MG tablet Take 40 mg by mouth 2 (two) times daily.   . carvedilol (COREG) 25 MG tablet Take 1 tablet (25 mg total) by mouth 2 (two) times daily.  . ciprofloxacin (CIPRO) 250 MG tablet Take 250 mg by mouth 2 (two) times daily.  Marland Kitchen ezetimibe (ZETIA) 10 MG tablet Take 10 mg by mouth daily.  Marland Kitchen FARXIGA 10 MG TABS tablet TAKE 1 TABLET BY MOUTH EVERY MORNING  . glucose blood test strip Use as instructed 4 x daily  Dx:E11.65  . Insulin Detemir (LEVEMIR FLEXTOUCH) 100 UNIT/ML Pen Inject 80 Units into the skin daily at 10 pm.  . Insulin Glulisine (APIDRA SOLOSTAR) 100 UNIT/ML Solostar Pen Inject 30-36 Units  into the skin 3 (three) times daily with meals.  . isosorbide mononitrate (IMDUR) 30 MG 24 hr tablet TAKE 1 TABLET BY MOUTH DAILY  . metFORMIN (GLUCOPHAGE) 1000 MG tablet TAKE 1 TABLET TWICE A DAY FOR DIABETES  . metroNIDAZOLE (FLAGYL) 500 MG tablet Take 2,000 mg by mouth 3 (three) times daily.  . nitroGLYCERIN (NITROSTAT) 0.4 MG SL tablet Place 1 tablet (0.4 mg total) under the tongue every 5 (five) minutes as needed.  . ondansetron (ZOFRAN) 4 MG tablet Take 4 mg by mouth every 6 (six) hours as needed for nausea or vomiting.   No facility-administered encounter medications on file as of 05/28/2015.   ALLERGIES: Allergies  Allergen Reactions  . Shrimp [Shellfish Allergy]     Swelling and hives    VACCINATION STATUS:  There is no immunization history on file for this patient.  Diabetes He presents for his follow-up diabetic visit. He has type 2 diabetes mellitus. Onset time: He was diagnosed at approximate age of 44 years. His disease course has been improving. There are no hypoglycemic associated symptoms. Pertinent negatives for hypoglycemia include no confusion, headaches, pallor or seizures. Pertinent negatives for diabetes include no chest pain, no fatigue, no polydipsia, no polyphagia, no  polyuria and no weakness. There are no hypoglycemic complications. Symptoms are improving. Diabetic complications include a CVA. Risk factors for coronary artery disease include diabetes mellitus, dyslipidemia, hypertension, male sex, obesity and sedentary lifestyle. Current diabetic treatment includes intensive insulin program. He is compliant with treatment most of the time. His weight is decreasing steadily. He is following a generally unhealthy diet. When asked about meal planning, he reported none. He has not had a previous visit with a dietitian (He declined referral to a CDE for diabetes education.). His breakfast blood glucose range is generally 140-180 mg/dl. His lunch blood glucose range is  generally 140-180 mg/dl. His dinner blood glucose range is generally 140-180 mg/dl. His overall blood glucose range is 140-180 mg/dl. An ACE inhibitor/angiotensin II receptor blocker is being taken. Eye exam is current.  Hyperlipidemia This is a chronic problem. The current episode started more than 1 year ago. Exacerbating diseases include diabetes and obesity. Pertinent negatives include no chest pain, leg pain, myalgias or shortness of breath. Current antihyperlipidemic treatment includes statins. Risk factors for coronary artery disease include diabetes mellitus, dyslipidemia, hypertension, male sex, obesity and a sedentary lifestyle.  Hypertension This is a chronic problem. The current episode started more than 1 year ago. The problem is controlled. Pertinent negatives include no chest pain, headaches, neck pain, palpitations or shortness of breath. Past treatments include ACE inhibitors. Hypertensive end-organ damage includes CVA.     Review of Systems  Constitutional: Negative for fatigue and unexpected weight change.  HENT: Negative for dental problem, mouth sores and trouble swallowing.   Eyes: Negative for visual disturbance.  Respiratory: Negative for cough, choking, chest tightness, shortness of breath and wheezing.   Cardiovascular: Negative for chest pain, palpitations and leg swelling.  Gastrointestinal: Negative for nausea, vomiting, abdominal pain, diarrhea, constipation and abdominal distention.  Endocrine: Negative for polydipsia, polyphagia and polyuria.  Genitourinary: Negative for dysuria, urgency, hematuria and flank pain.  Musculoskeletal: Negative for myalgias, back pain, gait problem and neck pain.  Skin: Negative for pallor, rash and wound.  Neurological: Negative for seizures, syncope, weakness, numbness and headaches.  Psychiatric/Behavioral: Negative.  Negative for confusion and dysphoric mood.    Objective:    BP 123/71 mmHg  Pulse 82  Ht 6\' 2"  (1.88 m)  Wt  254 lb (115.214 kg)  BMI 32.60 kg/m2  SpO2 95%  Wt Readings from Last 3 Encounters:  05/28/15 254 lb (115.214 kg)  02/24/15 261 lb (118.389 kg)  04/23/14 257 lb (116.574 kg)    Physical Exam  Constitutional: He is oriented to person, place, and time. He appears well-developed and well-nourished. He is cooperative. No distress.  HENT:  Head: Normocephalic and atraumatic.  Eyes: EOM are normal.  Neck: Normal range of motion. Neck supple. No tracheal deviation present. No thyromegaly present.  Cardiovascular: Normal rate, S1 normal, S2 normal and normal heart sounds.  Exam reveals no gallop.   No murmur heard. Pulses:      Dorsalis pedis pulses are 1+ on the right side, and 1+ on the left side.       Posterior tibial pulses are 1+ on the right side, and 1+ on the left side.  Pulmonary/Chest: Breath sounds normal. No respiratory distress. He has no wheezes.  Abdominal: Soft. Bowel sounds are normal. He exhibits no distension. There is no tenderness. There is no guarding and no CVA tenderness.  Musculoskeletal: He exhibits no edema.       Right shoulder: He exhibits no swelling and no deformity.  Neurological: He is alert and oriented to person, place, and time. He has normal strength and normal reflexes. No cranial nerve deficit or sensory deficit. Gait normal.  Skin: Skin is warm and dry. No rash noted. No cyanosis. Nails show no clubbing.  Psychiatric: He has a normal mood and affect. His speech is normal and behavior is normal. Judgment and thought content normal. Cognition and memory are normal.    Results for orders placed or performed in visit on [id number]  Basic metabolic panel  Result Value Ref Range   Sodium 140 135 - 146 mmol/L   Potassium 4.5 3.5 - 5.3 mmol/L   Chloride 105 98 - 110 mmol/L   CO2 26 20 - 31 mmol/L   Glucose, Bld 176 (H) 65 - 99 mg/dL   BUN 14 7 - 25 mg/dL   Creat 0.98 0.70 - 1.25 mg/dL   Calcium 9.4 8.6 - 10.3 mg/dL  Hemoglobin A1c  Result Value Ref Range    Hgb A1c MFr Bld 9.0 (H) <5.7 %   Mean Plasma Glucose 212 (H) <117 mg/dL   Complete Blood Count (Most recent): Lab Results  Component Value Date   WBC 10.4 09/22/2014   HGB 16.3 09/22/2014   HCT 48.9 09/22/2014   MCV 78.7 09/22/2014   PLT 242 09/22/2014    Assessment & Plan:   1. Type 2 diabetes mellitus with vascular disease (HCC)   -His  diabetes is  complicated by CVA and patient remains at a high risk for more acute and chronic complications of diabetes which include CAD, CVA, CKD, retinopathy, and neuropathy. These are all discussed in detail with the patient.  Patient came with Much better and near target glucose profile, and  recent A1c of 9% improving from 9.9 %.  Last 30 days average blood glucose is 1 63 mg/dL.  Glucose logs and insulin administration records pertaining to this visit,  to be scanned into patient's records.  Recent labs reviewed.   - I have re-counseled the patient on diet management and weight loss  by adopting a carbohydrate restricted / protein rich  Diet.  - Suggestion is made for patient to avoid simple carbohydrates   from their diet including Cakes , Desserts, Ice Cream,  Soda (  diet and regular) , Sweet Tea , Candies,  Chips, Cookies, Artificial Sweeteners,   and "Sugar-free" Products .  This will help patient to have stable blood glucose profile and potentially avoid unintended  Weight gain.  - Patient is advised to stick to a routine mealtimes to eat 3 meals  a day and avoid unnecessary snacks ( to snack only to correct hypoglycemia).  - The patient  declined referral to CDE for individualized DM education.  - I have approached patient with the following individualized plan to manage diabetes and patient agrees.  -Continue Levemir 80 units daily at bedtime, Apidra 30 units 3 times a day before meals, associated with monitoring of blood glucose before meals and at bedtime. Continue Farxiga 10 mg by mouth every morning. Continue metformin 1000  mg by mouth twice a day. Patient is advised to call clinic if he registers blood glucose less than 70 mg/dL or greater than 300 mg/dL.   - Patient specific target  for A1c; LDL, HDL, Triglycerides, and  Waist Circumference were discussed in detail.  2) BP/HTN:controlled. Continue current medications including ACEI/ARB. 3) Lipids/HPL:  continue statins. 4)  Weight/Diet: CDE consult in progress, exercise, and carbohydrates information provided.  5) Chronic Care/Health Maintenance:  -  Patient  Is  on ACEI/ARB and Statin medications and encouraged to continue to follow up with Ophthalmology, Podiatrist at least yearly or according to recommendations, and advised to  stay away from smoking. I have recommended yearly flu vaccine and pneumonia vaccination at least every 5 years; moderate intensity exercise for up to 150 minutes weekly; and  sleep for at least 7 hours a day.  - 25 minutes of time was spent on the care of this patient , 50% of which was applied for counseling on diabetes complications and their preventions.  - I advised patient to maintain close follow up with Curlene Labrum, MD for primary care needs.  Patient is asked to bring meter and  blood glucose logs during their next visit.   Follow up plan: -Return in about 3 months (around 08/28/2015) for diabetes, high blood pressure, high cholesterol, follow up with pre-visit labs, meter, and logs.  Glade Lloyd, MD Phone: [phone]  Fax: [phone]   05/28/2015, 9:22 AM

## 2015-05-28 NOTE — Patient Instructions (Signed)
Advice for weight management -For most of us the best way to lose weight is by diet management. Generally speaking, diet management means restricting carbohydrate consumption to minimum possible (and to unprocessed or minimally processed complex starch) and increasing protein intake (animal or plant source), fruits, and vegetables.  -Sticking to a routine mealtime to eat 3 meals a day and avoiding unnecessary snacks is shown to have a big role in weight control.  -It is better to avoid simple carbohydrates including: Cakes, Desserts, Ice Cream, Soda (diet and regular), Sweet Tea, Candies, Chips, Cookies, Artificial Sweeteners, and "Sugar-free" Products.   -Exercise: 30 minutes a day 3-4 days a week, or 150 minutes a week. Combine stretch, strength, and aerobic activities. You may seek evaluation by your heart doctor prior to initiating exercise if you have high risk for heart disease.  -If you are interested, we can schedule a visit with Alex Cortez, RDN, CDE for individualized nutrition education.  

## 2015-06-03 ENCOUNTER — Ambulatory Visit (INDEPENDENT_AMBULATORY_CARE_PROVIDER_SITE_OTHER): Payer: Medicare Other | Admitting: Cardiovascular Disease

## 2015-06-03 ENCOUNTER — Encounter: Payer: Self-pay | Admitting: Cardiovascular Disease

## 2015-06-03 VITALS — BP 130/70 | HR 76 | Ht 75.0 in | Wt 255.0 lb

## 2015-06-03 DIAGNOSIS — I10 Essential (primary) hypertension: Secondary | ICD-10-CM | POA: Diagnosis not present

## 2015-06-03 DIAGNOSIS — E785 Hyperlipidemia, unspecified: Secondary | ICD-10-CM | POA: Diagnosis not present

## 2015-06-03 DIAGNOSIS — E119 Type 2 diabetes mellitus without complications: Secondary | ICD-10-CM

## 2015-06-03 DIAGNOSIS — Z8673 Personal history of transient ischemic attack (TIA), and cerebral infarction without residual deficits: Secondary | ICD-10-CM

## 2015-06-03 DIAGNOSIS — I25118 Atherosclerotic heart disease of native coronary artery with other forms of angina pectoris: Secondary | ICD-10-CM | POA: Diagnosis not present

## 2015-06-03 NOTE — Patient Instructions (Signed)
Continue all current medications. Your physician wants you to follow up in:  1 year.  You will receive a reminder letter in the mail one-two months in advance.  If you don't receive a letter, please call our office to schedule the follow up appointment   

## 2015-06-03 NOTE — Progress Notes (Signed)
Patient ID: Alex Cortez, male   DOB: 11-16-1950, 65 y.o.   MRN: [id number]      SUBJECTIVE: The patient presents for routine follow up. He has a history of coronary artery disease with a previously placed mid LAD drug-eluting stent in June 2011. His most recent coronary angiogram was in June 2013 which revealed a widely patent stent. He also has hypertension, hyperlipidemia, CVA in 10/2012, and insulin-dependent diabetes mellitus.  He denies chest pain and significant shortness of breath, as well as palpitations and leg swelling. HbA1C 9% on 05/22/15.   Review of Systems: As per "subjective", otherwise negative.  Allergies  Allergen Reactions  . Shrimp [Shellfish Allergy]     Swelling and hives     Current Outpatient Prescriptions  Medication Sig Dispense Refill  . amLODipine (NORVASC) 10 MG tablet Take 10 mg by mouth daily.      Marland Kitchen aspirin 325 MG tablet Take 325 mg by mouth daily.    Marland Kitchen atorvastatin (LIPITOR) 80 MG tablet Take 1 tablet (80 mg total) by mouth every evening. 90 tablet 3  . benazepril (LOTENSIN) 40 MG tablet Take 40 mg by mouth 2 (two) times daily.     . carvedilol (COREG) 25 MG tablet Take 1 tablet (25 mg total) by mouth 2 (two) times daily. 180 tablet 3  . FARXIGA 10 MG TABS tablet TAKE 1 TABLET BY MOUTH EVERY MORNING 30 tablet 3  . glucose blood test strip Use as instructed 4 x daily  Dx:E11.65 150 each 5  . Insulin Detemir (LEVEMIR FLEXTOUCH) 100 UNIT/ML Pen Inject 80 Units into the skin daily at 10 pm. 75 mL 2  . Insulin Glulisine (APIDRA SOLOSTAR) 100 UNIT/ML Solostar Pen Inject 30-36 Units into the skin 3 (three) times daily with meals. 75 mL 1  . isosorbide mononitrate (IMDUR) 30 MG 24 hr tablet TAKE 1 TABLET BY MOUTH DAILY 30 tablet 4  . metFORMIN (GLUCOPHAGE) 1000 MG tablet TAKE 1 TABLET TWICE A DAY FOR DIABETES  3  . nitroGLYCERIN (NITROSTAT) 0.4 MG SL tablet Place 1 tablet (0.4 mg total) under the tongue every 5 (five) minutes as needed. 25 tablet 3   No  current facility-administered medications for this visit.    Past Medical History  Diagnosis Date  . Diabetes mellitus     insulin-dependent  . HTN (hypertension)   . Dyslipidemia   . Chest pain   . Post MI syndrome ([place]) 08-29-2009  . CAD (coronary artery disease)     nonobstructive CAD/patent LAD stent site; EF 65%, 08/2011; NSTEMI/DES LAD, 6/20 11    Past Surgical History  Procedure Laterality Date  . Coronary angioplasty with stent placement      Social History   Social History  . Marital Status: Married    Spouse Name: N/A  . Number of Children: N/A  . Years of Education: N/A   Occupational History  . Not on file.   Social History Main Topics  . Smoking status: Former Smoker -- 0.50 packs/day for 23 years    Types: Cigarettes    Start date: 03/16/1955    Quit date: 03/15/1988  . Smokeless tobacco: Never Used  . Alcohol Use: No  . Drug Use: No  . Sexual Activity: Not on file   Other Topics Concern  . Not on file   Social History Narrative   Works at [employer]. Married.      Filed Vitals:   06/03/15 1302  BP: 130/70  Pulse: 76  Height:  6\' 3"  (1.905 m)  Weight: 255 lb (115.667 kg)  SpO2: 94%    PHYSICAL EXAM General: NAD HEENT: Normal. Neck: No JVD, no thyromegaly. Lungs: Clear to auscultation bilaterally with normal respiratory effort. CV: Nondisplaced PMI. Regular rate and rhythm, normal S1/S2, no S3/S4, no murmur. No pretibial or periankle edema. No carotid bruit.  Abdomen: Soft, firm, obese.  Neurologic: Alert and oriented x 3.  Psych: Normal affect. Skin: Normal. Musculoskeletal: No gross deformities. Extremities: No clubbing or cyanosis.   ECG: Most recent ECG reviewed.      ASSESSMENT AND PLAN:  CAD  Remains quiescent on medical therapy, reporting no recurrent CP since undergoing PCI of the mid-LAD in June 2011. His most recent coronary angiogram in 08/2011 showed a widely patent stent.  Continue ASA (on 325 mg for h/o  CVA), Coreg, Imdur, and Lipitor.  Essential HTN  Well controlled today. Continue Coreg 25 mg bid, amlodipine 10 mg, and benazepril 40 mg bid.   Hyperlipidemia  Will obtain copy of most recent lipids. On Lipitor 80 mg.  Diabetes Mellitus  HbA1C 9% on 05/22/15. Followed by both Dr. Pleas Koch (PCP) and Dr. Dorris Fetch (endocrinologist).   Dispo: f/u 1 year.   Kate Sable, M.D., F.A.C.C.

## 2015-06-04 ENCOUNTER — Encounter: Payer: Self-pay | Admitting: *Deleted

## 2015-07-30 ENCOUNTER — Other Ambulatory Visit: Payer: Self-pay | Admitting: "Endocrinology

## 2015-08-25 DIAGNOSIS — I10 Essential (primary) hypertension: Secondary | ICD-10-CM | POA: Diagnosis not present

## 2015-08-25 DIAGNOSIS — E782 Mixed hyperlipidemia: Secondary | ICD-10-CM | POA: Diagnosis not present

## 2015-08-25 DIAGNOSIS — E1165 Type 2 diabetes mellitus with hyperglycemia: Secondary | ICD-10-CM | POA: Diagnosis not present

## 2015-08-25 LAB — HEMOGLOBIN A1C: Hemoglobin A1C: 9.6

## 2015-09-04 DIAGNOSIS — J302 Other seasonal allergic rhinitis: Secondary | ICD-10-CM | POA: Diagnosis not present

## 2015-09-04 DIAGNOSIS — E782 Mixed hyperlipidemia: Secondary | ICD-10-CM | POA: Diagnosis not present

## 2015-09-04 DIAGNOSIS — D126 Benign neoplasm of colon, unspecified: Secondary | ICD-10-CM | POA: Diagnosis not present

## 2015-09-04 DIAGNOSIS — E1165 Type 2 diabetes mellitus with hyperglycemia: Secondary | ICD-10-CM | POA: Diagnosis not present

## 2015-09-04 DIAGNOSIS — I240 Acute coronary thrombosis not resulting in myocardial infarction: Secondary | ICD-10-CM | POA: Diagnosis not present

## 2015-09-04 DIAGNOSIS — I6350 Cerebral infarction due to unspecified occlusion or stenosis of unspecified cerebral artery: Secondary | ICD-10-CM | POA: Diagnosis not present

## 2015-09-04 DIAGNOSIS — I10 Essential (primary) hypertension: Secondary | ICD-10-CM | POA: Diagnosis not present

## 2015-09-05 ENCOUNTER — Ambulatory Visit: Payer: Medicare Other | Admitting: "Endocrinology

## 2015-09-09 ENCOUNTER — Encounter: Payer: Self-pay | Admitting: "Endocrinology

## 2015-09-09 ENCOUNTER — Ambulatory Visit (INDEPENDENT_AMBULATORY_CARE_PROVIDER_SITE_OTHER): Payer: Medicare Other | Admitting: "Endocrinology

## 2015-09-09 VITALS — BP 139/80 | HR 80 | Ht 75.0 in | Wt 255.0 lb

## 2015-09-09 DIAGNOSIS — Z6832 Body mass index (BMI) 32.0-32.9, adult: Secondary | ICD-10-CM

## 2015-09-09 DIAGNOSIS — Z9189 Other specified personal risk factors, not elsewhere classified: Secondary | ICD-10-CM | POA: Insufficient documentation

## 2015-09-09 DIAGNOSIS — E785 Hyperlipidemia, unspecified: Secondary | ICD-10-CM | POA: Diagnosis not present

## 2015-09-09 DIAGNOSIS — I10 Essential (primary) hypertension: Secondary | ICD-10-CM

## 2015-09-09 DIAGNOSIS — E1159 Type 2 diabetes mellitus with other circulatory complications: Secondary | ICD-10-CM | POA: Diagnosis not present

## 2015-09-09 DIAGNOSIS — E6609 Other obesity due to excess calories: Secondary | ICD-10-CM | POA: Insufficient documentation

## 2015-09-09 DIAGNOSIS — E66811 Obesity, class 1: Secondary | ICD-10-CM | POA: Insufficient documentation

## 2015-09-09 NOTE — Patient Instructions (Signed)

## 2015-09-09 NOTE — Progress Notes (Signed)
Subjective:    Patient ID: Alex Cortez, male    DOB: [birth year], PCP Curlene Labrum, MD   Past Medical History  Diagnosis Date  . Diabetes mellitus     insulin-dependent  . HTN (hypertension)   . Dyslipidemia   . Chest pain   . Post MI syndrome ([place]) 08-29-2009  . CAD (coronary artery disease)     nonobstructive CAD/patent LAD stent site; EF 65%, 08/2011; NSTEMI/DES LAD, 6/20 11   Past Surgical History  Procedure Laterality Date  . Coronary angioplasty with stent placement     Social History   Social History  . Marital Status: Married    Spouse Name: N/A  . Number of Children: N/A  . Years of Education: N/A   Social History Main Topics  . Smoking status: Former Smoker -- 0.50 packs/day for 23 years    Types: Cigarettes    Start date: 03/16/1955    Quit date: 03/15/1988  . Smokeless tobacco: Never Used  . Alcohol Use: No  . Drug Use: No  . Sexual Activity: Not Asked   Other Topics Concern  . None   Social History Narrative   Works at [employer]. Married.    Outpatient Encounter Prescriptions as of 09/09/2015  Medication Sig  . Insulin Glulisine (APIDRA SOLOSTAR IJ) Inject 28-34 Units as directed 3 (three) times daily with meals.  Marland Kitchen amLODipine (NORVASC) 10 MG tablet Take 10 mg by mouth daily.    Marland Kitchen aspirin 325 MG tablet Take 325 mg by mouth daily.  Marland Kitchen atorvastatin (LIPITOR) 80 MG tablet Take 1 tablet (80 mg total) by mouth every evening.  . benazepril (LOTENSIN) 40 MG tablet Take 40 mg by mouth 2 (two) times daily.   . carvedilol (COREG) 25 MG tablet Take 1 tablet (25 mg total) by mouth 2 (two) times daily.  Marland Kitchen FARXIGA 10 MG TABS tablet TAKE 1 TABLET BY MOUTH EVERY MORNING  . glucose blood test strip Use as instructed 4 x daily  Dx:E11.65  . Insulin Detemir (LEVEMIR FLEXTOUCH) 100 UNIT/ML Pen Inject 80 Units into the skin daily at 10 pm.  . isosorbide mononitrate (IMDUR) 30 MG 24 hr tablet TAKE 1 TABLET BY MOUTH DAILY  . metFORMIN (GLUCOPHAGE) 1000 MG  tablet TAKE 1 TABLET TWICE A DAY FOR DIABETES  . nitroGLYCERIN (NITROSTAT) 0.4 MG SL tablet Place 1 tablet (0.4 mg total) under the tongue every 5 (five) minutes as needed.  . [DISCONTINUED] Insulin Glulisine (APIDRA SOLOSTAR) 100 UNIT/ML Solostar Pen Inject 30-36 Units into the skin 3 (three) times daily with meals.   No facility-administered encounter medications on file as of 09/09/2015.   ALLERGIES: Allergies  Allergen Reactions  . Shrimp [Shellfish Allergy]     Swelling and hives    VACCINATION STATUS:  There is no immunization history on file for this patient.  Diabetes He presents for his follow-up diabetic visit. He has type 2 diabetes mellitus. Onset time: He was diagnosed at approximate age of 79 years. His disease course has been worsening. There are no hypoglycemic associated symptoms. Pertinent negatives for hypoglycemia include no confusion, headaches, pallor or seizures. Pertinent negatives for diabetes include no chest pain, no fatigue, no polydipsia, no polyphagia, no polyuria and no weakness. There are no hypoglycemic complications. Symptoms are worsening. Diabetic complications include a CVA. Risk factors for coronary artery disease include diabetes mellitus, dyslipidemia, hypertension, male sex, obesity and sedentary lifestyle. Current diabetic treatment includes intensive insulin program. He is compliant with treatment most of  the time. His weight is stable. He is following a generally unhealthy diet. When asked about meal planning, he reported none. He has not had a previous visit with a dietitian (He declined referral to a CDE for diabetes education.). His breakfast blood glucose range is generally >200 mg/dl. His lunch blood glucose range is generally >200 mg/dl. His dinner blood glucose range is generally >200 mg/dl. His overall blood glucose range is >200 mg/dl. An ACE inhibitor/angiotensin II receptor blocker is being taken. Eye exam is current.  Hyperlipidemia This is a  chronic problem. The current episode started more than 1 year ago. Exacerbating diseases include diabetes and obesity. Pertinent negatives include no chest pain, leg pain, myalgias or shortness of breath. Current antihyperlipidemic treatment includes statins. Risk factors for coronary artery disease include diabetes mellitus, dyslipidemia, hypertension, male sex, obesity and a sedentary lifestyle.  Hypertension This is a chronic problem. The current episode started more than 1 year ago. The problem is controlled. Pertinent negatives include no chest pain, headaches, neck pain, palpitations or shortness of breath. Past treatments include ACE inhibitors. Hypertensive end-organ damage includes CVA.     Review of Systems  Constitutional: Negative for fatigue and unexpected weight change.  HENT: Negative for dental problem, mouth sores and trouble swallowing.   Eyes: Negative for visual disturbance.  Respiratory: Negative for cough, choking, chest tightness, shortness of breath and wheezing.   Cardiovascular: Negative for chest pain, palpitations and leg swelling.  Gastrointestinal: Negative for nausea, vomiting, abdominal pain, diarrhea, constipation and abdominal distention.  Endocrine: Negative for polydipsia, polyphagia and polyuria.  Genitourinary: Negative for dysuria, urgency, hematuria and flank pain.  Musculoskeletal: Negative for myalgias, back pain, gait problem and neck pain.  Skin: Negative for pallor, rash and wound.  Neurological: Negative for seizures, syncope, weakness, numbness and headaches.  Psychiatric/Behavioral: Negative.  Negative for confusion and dysphoric mood.    Objective:    BP 139/80 mmHg  Pulse 80  Ht 6\' 3"  (1.905 m)  Wt 255 lb (115.667 kg)  BMI 31.87 kg/m2  Wt Readings from Last 3 Encounters:  09/09/15 255 lb (115.667 kg)  06/03/15 255 lb (115.667 kg)  05/28/15 254 lb (115.214 kg)    Physical Exam  Constitutional: He is oriented to person, place, and  time. He appears well-developed and well-nourished. He is cooperative. No distress.  HENT:  Head: Normocephalic and atraumatic.  Eyes: EOM are normal.  Neck: Normal range of motion. Neck supple. No tracheal deviation present. No thyromegaly present.  Cardiovascular: Normal rate, S1 normal, S2 normal and normal heart sounds.  Exam reveals no gallop.   No murmur heard. Pulses:      Dorsalis pedis pulses are 1+ on the right side, and 1+ on the left side.       Posterior tibial pulses are 1+ on the right side, and 1+ on the left side.  Pulmonary/Chest: Breath sounds normal. No respiratory distress. He has no wheezes.  Abdominal: Bowel sounds are normal. He exhibits no distension. There is no tenderness. There is no guarding and no CVA tenderness.  He has abdominal lipodystrophy from insulin injections.   Musculoskeletal: He exhibits no edema.       Right shoulder: He exhibits no swelling and no deformity.  Neurological: He is alert and oriented to person, place, and time. He has normal strength and normal reflexes. No cranial nerve deficit or sensory deficit. Gait normal.  Skin: Skin is warm and dry. No rash noted. No cyanosis. Nails show no clubbing.  Psychiatric: He has a normal mood and affect. His speech is normal and behavior is normal. Judgment and thought content normal. Cognition and memory are normal.    Results for orders placed or performed in visit on 09/09/15  Hemoglobin A1c  Result Value Ref Range   Hemoglobin A1C 9.6    Complete Blood Count (Most recent): Lab Results  Component Value Date   WBC 10.4 09/22/2014   HGB 16.3 09/22/2014   HCT 48.9 09/22/2014   MCV 78.7 09/22/2014   PLT 242 09/22/2014    Assessment & Plan:   1. Type 2 diabetes mellitus with vascular disease (HCC)   -His  diabetes is  complicated by CVA and patient remains at a high risk for more acute and chronic complications of diabetes which include CAD, CVA, CKD, retinopathy, and neuropathy. These  are all discussed in detail with the patient.  Patient came with above target  glucose profile, and  recent A1c of 9.6% , worsening from 9% .   Glucose logs and insulin administration records pertaining to this visit,  to be scanned into patient's records.  Recent labs reviewed.   - I have re-counseled the patient on diet management and weight loss  by adopting a carbohydrate restricted / protein rich  Diet.  - Suggestion is made for patient to avoid simple carbohydrates   from their diet including Cakes , Desserts, Ice Cream,  Soda (  diet and regular) , Sweet Tea , Candies,  Chips, Cookies, Artificial Sweeteners,   and "Sugar-free" Products .  This will help patient to have stable blood glucose profile and potentially avoid unintended  Weight gain.  - Patient is advised to stick to a routine mealtimes to eat 3 meals  a day and avoid unnecessary snacks ( to snack only to correct hypoglycemia).  - The patient  declined referral to CDE for individualized DM education.  - I have approached patient with the following individualized plan to manage diabetes and patient agrees.  - Emphasizing the need for strict compliance, I advised him to continue Levemir 80 units daily at bedtime, increase Apidra to 28 units 3 times a day before meals, associated with monitoring of blood glucose before meals and at bedtime. Continue Farxiga 10 mg by mouth every morning. Continue metformin 1000 mg by mouth twice a day. -I have advised him on how he can rotate insulin injection sites on his abdominal skin to avoid worsening of the lipodystrophy. Patient is advised to call clinic if he registers blood glucose less than 70 mg/dL or greater than 300 mg/dL.   - Patient specific target  for A1c; LDL, HDL, Triglycerides, and  Waist Circumference were discussed in detail.  2) BP/HTN:controlled. Continue current medications including ACEI/ARB. 3) Lipids/HPL:  continue statins. 4)  Weight/Diet:  He declined CDE consult  , exercise, and carbohydrates information provided.  5) Chronic Care/Health Maintenance:  -Patient  Is  on ACEI/ARB and Statin medications and encouraged to continue to follow up with Ophthalmology, Podiatrist at least yearly or according to recommendations, and advised to  stay away from smoking. I have recommended yearly flu vaccine and pneumonia vaccination at least every 5 years; moderate intensity exercise for up to 150 minutes weekly; and  sleep for at least 7 hours a day.  - 25 minutes of time was spent on the care of this patient , 50% of which was applied for counseling on diabetes complications and their preventions.  - I advised patient to maintain close follow up  with Curlene Labrum, MD for primary care needs.  Patient is asked to bring meter and  blood glucose logs during their next visit.   Follow up plan: -Return in about 3 months (around 12/10/2015) for follow up with pre-visit labs, meter, and logs.  Glade Lloyd, MD Phone: [phone]  Fax: [phone]   09/09/2015, 10:21 AM

## 2015-11-07 ENCOUNTER — Other Ambulatory Visit: Payer: Self-pay | Admitting: Cardiovascular Disease

## 2015-11-12 ENCOUNTER — Other Ambulatory Visit: Payer: Self-pay | Admitting: "Endocrinology

## 2015-11-26 ENCOUNTER — Other Ambulatory Visit: Payer: Self-pay | Admitting: "Endocrinology

## 2015-12-05 ENCOUNTER — Other Ambulatory Visit: Payer: Self-pay | Admitting: "Endocrinology

## 2015-12-05 DIAGNOSIS — E1159 Type 2 diabetes mellitus with other circulatory complications: Secondary | ICD-10-CM | POA: Diagnosis not present

## 2015-12-05 LAB — COMPLETE METABOLIC PANEL WITH GFR
ALT: 49 U/L — AB (ref 9–46)
AST: 22 U/L (ref 10–35)
Albumin: 4 g/dL (ref 3.6–5.1)
Alkaline Phosphatase: 60 U/L (ref 40–115)
BILIRUBIN TOTAL: 0.6 mg/dL (ref 0.2–1.2)
BUN: 13 mg/dL (ref 7–25)
CO2: 25 mmol/L (ref 20–31)
CREATININE: 0.94 mg/dL (ref 0.70–1.25)
Calcium: 8.8 mg/dL (ref 8.6–10.3)
Chloride: 105 mmol/L (ref 98–110)
GFR, Est African American: >89 mL/min (ref >=60)
GFR, Est Non African American: 84 mL/min (ref >=60)
GLUCOSE: 150 mg/dL — AB (ref 65–99)
Potassium: 4.2 mmol/L (ref 3.5–5.3)
SODIUM: 140 mmol/L (ref 135–146)
TOTAL PROTEIN: 7.5 g/dL (ref 6.1–8.1)

## 2015-12-06 LAB — HEMOGLOBIN A1C
Hgb A1c MFr Bld: 8.6 % — ABNORMAL HIGH (ref <5.7)
Mean Plasma Glucose: 200 mg/dL

## 2015-12-08 ENCOUNTER — Other Ambulatory Visit: Payer: Self-pay | Admitting: "Endocrinology

## 2015-12-11 ENCOUNTER — Encounter: Payer: Self-pay | Admitting: "Endocrinology

## 2015-12-11 ENCOUNTER — Other Ambulatory Visit: Payer: Self-pay | Admitting: "Endocrinology

## 2015-12-11 ENCOUNTER — Ambulatory Visit (INDEPENDENT_AMBULATORY_CARE_PROVIDER_SITE_OTHER): Payer: Medicare Other | Admitting: "Endocrinology

## 2015-12-11 VITALS — BP 143/80 | HR 82 | Ht 75.0 in | Wt 258.0 lb

## 2015-12-11 DIAGNOSIS — E785 Hyperlipidemia, unspecified: Secondary | ICD-10-CM | POA: Diagnosis not present

## 2015-12-11 DIAGNOSIS — Z9189 Other specified personal risk factors, not elsewhere classified: Secondary | ICD-10-CM | POA: Diagnosis not present

## 2015-12-11 DIAGNOSIS — I10 Essential (primary) hypertension: Secondary | ICD-10-CM

## 2015-12-11 DIAGNOSIS — E1159 Type 2 diabetes mellitus with other circulatory complications: Secondary | ICD-10-CM

## 2015-12-11 NOTE — Progress Notes (Signed)
Subjective:    Patient ID: Alex Cortez, male    DOB: 09-Dec-1950, PCP Curlene Labrum, MD   Past Medical History:  Diagnosis Date  . CAD (coronary artery disease)    nonobstructive CAD/patent LAD stent site; EF 65%, 08/2011; NSTEMI/DES LAD, 6/20 11  . Chest pain   . Diabetes mellitus    insulin-dependent  . Dyslipidemia   . HTN (hypertension)   . Post MI syndrome [facility]) 08-29-2009   Past Surgical History:  Procedure Laterality Date  . CORONARY ANGIOPLASTY WITH STENT PLACEMENT     Social History   Social History  . Marital status: Married    Spouse name: N/A  . Number of children: N/A  . Years of education: N/A   Social History Main Topics  . Smoking status: Former Smoker    Packs/day: 0.50    Years: 23.00    Types: Cigarettes    Start date: 03/16/1955    Quit date: 03/15/1988  . Smokeless tobacco: Never Used  . Alcohol use No  . Drug use: No  . Sexual activity: Not Asked   Other Topics Concern  . None   Social History Narrative   Works at [employer]. Married.    Outpatient Encounter Prescriptions as of 12/11/2015  Medication Sig  . amLODipine (NORVASC) 10 MG tablet Take 10 mg by mouth daily.    Marland Kitchen aspirin 325 MG tablet Take 325 mg by mouth daily.  Marland Kitchen atorvastatin (LIPITOR) 80 MG tablet Take 1 tablet (80 mg total) by mouth every evening.  . benazepril (LOTENSIN) 40 MG tablet Take 40 mg by mouth 2 (two) times daily.   . carvedilol (COREG) 25 MG tablet TAKE 1 TABLET (25 MG TOTAL) BY MOUTH 2 (TWO) TIMES DAILY.  Marland Kitchen FARXIGA 10 MG TABS tablet TAKE 1 TABLET BY MOUTH EVERY MORNING  . Insulin Detemir (LEVEMIR FLEXTOUCH) 100 UNIT/ML Pen Inject 80 Units into the skin daily at 10 pm.  . Insulin Glulisine (APIDRA SOLOSTAR IJ) Inject 28-34 Units as directed 3 (three) times daily with meals.  . isosorbide mononitrate (IMDUR) 30 MG 24 hr tablet TAKE 1 TABLET BY MOUTH DAILY  . metFORMIN (GLUCOPHAGE) 1000 MG tablet TAKE 1 TABLET TWICE A DAY FOR DIABETES  . nitroGLYCERIN  (NITROSTAT) 0.4 MG SL tablet Place 1 tablet (0.4 mg total) under the tongue every 5 (five) minutes as needed.  . ONE TOUCH ULTRA TEST test strip USE TO TEST BLOOD SUGAR FOUR TIMES DAILY E11.65  . [DISCONTINUED] APIDRA SOLOSTAR 100 UNIT/ML Solostar Pen INJECT 30-36 UNITS         SUBCUTANEOUSLY 3 TIMES A   DAY WITH MEALS   No facility-administered encounter medications on file as of 12/11/2015.    ALLERGIES: Allergies  Allergen Reactions  . Shrimp [Shellfish Allergy]     Swelling and hives    VACCINATION STATUS:  There is no immunization history on file for this patient.  Diabetes  He presents for his follow-up diabetic visit. He has type 2 diabetes mellitus. Onset time: He was diagnosed at approximate age of 37 years. His disease course has been worsening. There are no hypoglycemic associated symptoms. Pertinent negatives for hypoglycemia include no confusion, headaches, pallor or seizures. Pertinent negatives for diabetes include no chest pain, no fatigue, no polydipsia, no polyphagia, no polyuria and no weakness. There are no hypoglycemic complications. Symptoms are worsening. Diabetic complications include a CVA. Risk factors for coronary artery disease include diabetes mellitus, dyslipidemia, hypertension, male sex, obesity and sedentary lifestyle. Current  diabetic treatment includes intensive insulin program. He is compliant with treatment most of the time. His weight is stable. He is following a generally unhealthy diet. When asked about meal planning, he reported none. He has not had a previous visit with a dietitian (He declined referral to a CDE for diabetes education.). His breakfast blood glucose range is generally >200 mg/dl. His lunch blood glucose range is generally >200 mg/dl. His dinner blood glucose range is generally >200 mg/dl. His overall blood glucose range is >200 mg/dl. An ACE inhibitor/angiotensin II receptor blocker is being taken. Eye exam is current.  Hyperlipidemia  This  is a chronic problem. The current episode started more than 1 year ago. Exacerbating diseases include diabetes and obesity. Pertinent negatives include no chest pain, leg pain, myalgias or shortness of breath. Current antihyperlipidemic treatment includes statins. Risk factors for coronary artery disease include diabetes mellitus, dyslipidemia, hypertension, male sex, obesity and a sedentary lifestyle.  Hypertension  This is a chronic problem. The current episode started more than 1 year ago. The problem is controlled. Pertinent negatives include no chest pain, headaches, neck pain, palpitations or shortness of breath. Past treatments include ACE inhibitors. Hypertensive end-organ damage includes CVA.     Review of Systems  Constitutional: Negative for fatigue and unexpected weight change.  HENT: Negative for dental problem, mouth sores and trouble swallowing.   Eyes: Negative for visual disturbance.  Respiratory: Negative for cough, choking, chest tightness, shortness of breath and wheezing.   Cardiovascular: Negative for chest pain, palpitations and leg swelling.  Gastrointestinal: Negative for abdominal distention, abdominal pain, constipation, diarrhea, nausea and vomiting.  Endocrine: Negative for polydipsia, polyphagia and polyuria.  Genitourinary: Negative for dysuria, flank pain, hematuria and urgency.  Musculoskeletal: Negative for back pain, gait problem, myalgias and neck pain.  Skin: Negative for pallor, rash and wound.  Neurological: Negative for seizures, syncope, weakness, numbness and headaches.  Psychiatric/Behavioral: Negative.  Negative for confusion and dysphoric mood.    Objective:    BP (!) 143/80   Pulse 82   Ht 6\' 3"  (1.905 m)   Wt 258 lb (117 kg)   BMI 32.25 kg/m   Wt Readings from Last 3 Encounters:  12/11/15 258 lb (117 kg)  09/09/15 255 lb (115.7 kg)  06/03/15 255 lb (115.7 kg)    Physical Exam  Constitutional: He is oriented to person, place, and time.  He appears well-developed and well-nourished. He is cooperative. No distress.  HENT:  Head: Normocephalic and atraumatic.  Eyes: EOM are normal.  Neck: Normal range of motion. Neck supple. No tracheal deviation present. No thyromegaly present.  Cardiovascular: Normal rate, S1 normal, S2 normal and normal heart sounds.  Exam reveals no gallop.   No murmur heard. Pulses:      Dorsalis pedis pulses are 1+ on the right side, and 1+ on the left side.       Posterior tibial pulses are 1+ on the right side, and 1+ on the left side.  Pulmonary/Chest: Breath sounds normal. No respiratory distress. He has no wheezes.  Abdominal: Bowel sounds are normal. He exhibits no distension. There is no tenderness. There is no guarding and no CVA tenderness.  He has abdominal lipodystrophy from insulin injections.   Musculoskeletal: He exhibits no edema.       Right shoulder: He exhibits no swelling and no deformity.  Neurological: He is alert and oriented to person, place, and time. He has normal strength and normal reflexes. No cranial nerve deficit or sensory  deficit. Gait normal.  Skin: Skin is warm and dry. No rash noted. No cyanosis. Nails show no clubbing.  Psychiatric: He has a normal mood and affect. His speech is normal and behavior is normal. Judgment and thought content normal. Cognition and memory are normal.    Results for orders placed or performed in visit on 12/05/15  COMPLETE METABOLIC PANEL WITH GFR  Result Value Ref Range   Sodium 140 135 - 146 mmol/L   Potassium 4.2 3.5 - 5.3 mmol/L   Chloride 105 98 - 110 mmol/L   CO2 25 20 - 31 mmol/L   Glucose, Bld 150 (H) 65 - 99 mg/dL   BUN 13 7 - 25 mg/dL   Creat 0.94 0.70 - 1.25 mg/dL   Total Bilirubin 0.6 0.2 - 1.2 mg/dL   Alkaline Phosphatase 60 40 - 115 U/L   AST 22 10 - 35 U/L   ALT 49 (H) 9 - 46 U/L   Total Protein 7.5 6.1 - 8.1 g/dL   Albumin 4.0 3.6 - 5.1 g/dL   Calcium 8.8 8.6 - 10.3 mg/dL   GFR, Est African American >89 >=60  mL/min   GFR, Est Non African American 84 >=60 mL/min  Hemoglobin A1c  Result Value Ref Range   Hgb A1c MFr Bld 8.6 (H) <5.7 %   Mean Plasma Glucose 200 mg/dL   Complete Blood Count (Most recent): Lab Results  Component Value Date   WBC 10.4 09/22/2014   HGB 16.3 09/22/2014   HCT 48.9 09/22/2014   MCV 78.7 09/22/2014   PLT 242 09/22/2014    Assessment & Plan:   1. Type 2 diabetes mellitus with vascular disease (HCC)   -His  diabetes is  complicated by CVA and patient remains at a high risk for more acute and chronic complications of diabetes which include CAD, CVA, CKD, retinopathy, and neuropathy. These are all discussed in detail with the patient.  Patient came with Near target  glucose profile, and  recent A1c of 8.6% , improving from 9.9% .  Glucose logs and insulin administration records pertaining to this visit,  to be scanned into patient's records.  Recent labs reviewed.   - I have re-counseled the patient on diet management and weight loss  by adopting a carbohydrate restricted / protein rich  Diet.  - Suggestion is made for patient to avoid simple carbohydrates   from their diet including Cakes , Desserts, Ice Cream,  Soda (  diet and regular) , Sweet Tea , Candies,  Chips, Cookies, Artificial Sweeteners,   and "Sugar-free" Products .  This will help patient to have stable blood glucose profile and potentially avoid unintended  Weight gain.  - Patient is advised to stick to a routine mealtimes to eat 3 meals  a day and avoid unnecessary snacks ( to snack only to correct hypoglycemia).  - The patient  declined referral to CDE for individualized DM education.  - I have approached patient with the following individualized plan to manage diabetes and patient agrees.  - Emphasizing the need for strict compliance, I advised him to continue Levemir 80 units daily at bedtime, increase Apidra to 28 units 3 times a day before meals, associated with monitoring of blood glucose  before meals and at bedtime. Continue Farxiga 10 mg by mouth every morning. Continue metformin 1000 mg by mouth twice a day. -I have advised him on how he can rotate insulin injection sites on his abdominal skin to avoid worsening of the lipodystrophy.  Patient is advised to call clinic if he registers blood glucose less than 70 mg/dL or greater than 300 mg/dL.  - He is open to consider bariatric surgery this time. I gave him brochure and contact number to initiate the process. - Patient specific target  for A1c; LDL, HDL, Triglycerides, and  Waist Circumference were discussed in detail.  2) BP/HTN:controlled. Continue current medications including ACEI/ARB. 3) Lipids/HPL:  continue statins. 4)  Weight/Diet:  He declined CDE consult , exercise, and carbohydrates information provided.  5) Chronic Care/Health Maintenance:  -Patient  Is  on ACEI/ARB and Statin medications and encouraged to continue to follow up with Ophthalmology, Podiatrist at least yearly or according to recommendations, and advised to  stay away from smoking. I have recommended yearly flu vaccine and pneumonia vaccination at least every 5 years; moderate intensity exercise for up to 150 minutes weekly; and  sleep for at least 7 hours a day.  - 25 minutes of time was spent on the care of this patient , 50% of which was applied for counseling on diabetes complications and their preventions.  - I advised patient to maintain close follow up with Curlene Labrum, MD for primary care needs.  Patient is asked to bring meter and  blood glucose logs during their next visit.   Follow up plan: -Return in about 3 months (around 03/11/2016) for follow up with pre-visit labs, meter, and logs.  Glade Lloyd, MD Phone: [phone]  Fax: [phone]   12/11/2015, 12:04 PM

## 2015-12-11 NOTE — Patient Instructions (Signed)

## 2015-12-15 ENCOUNTER — Other Ambulatory Visit: Payer: Self-pay | Admitting: "Endocrinology

## 2016-01-26 ENCOUNTER — Other Ambulatory Visit: Payer: Self-pay

## 2016-01-26 MED ORDER — DAPAGLIFLOZIN PROPANEDIOL 10 MG PO TABS: 10.0000 mg | ORAL_TABLET | Freq: Every morning | ORAL | 3 refills | Status: DC

## 2016-02-17 DIAGNOSIS — I10 Essential (primary) hypertension: Secondary | ICD-10-CM | POA: Diagnosis not present

## 2016-02-17 DIAGNOSIS — E1165 Type 2 diabetes mellitus with hyperglycemia: Secondary | ICD-10-CM | POA: Diagnosis not present

## 2016-02-17 DIAGNOSIS — E782 Mixed hyperlipidemia: Secondary | ICD-10-CM | POA: Diagnosis not present

## 2016-02-17 DIAGNOSIS — I240 Acute coronary thrombosis not resulting in myocardial infarction: Secondary | ICD-10-CM | POA: Diagnosis not present

## 2016-02-17 LAB — LIPID PANEL
Cholesterol: 156 mg/dL (ref 0–200)
HDL: 37 mg/dL (ref 35–70)
LDL CALC: 95 mg/dL
Triglycerides: 120 mg/dL (ref 40–160)

## 2016-02-19 DIAGNOSIS — D126 Benign neoplasm of colon, unspecified: Secondary | ICD-10-CM | POA: Diagnosis not present

## 2016-02-19 DIAGNOSIS — Z0001 Encounter for general adult medical examination with abnormal findings: Secondary | ICD-10-CM | POA: Diagnosis not present

## 2016-02-19 DIAGNOSIS — E1165 Type 2 diabetes mellitus with hyperglycemia: Secondary | ICD-10-CM | POA: Diagnosis not present

## 2016-02-19 DIAGNOSIS — I240 Acute coronary thrombosis not resulting in myocardial infarction: Secondary | ICD-10-CM | POA: Diagnosis not present

## 2016-02-19 DIAGNOSIS — I10 Essential (primary) hypertension: Secondary | ICD-10-CM | POA: Diagnosis not present

## 2016-02-19 DIAGNOSIS — Z6834 Body mass index (BMI) 34.0-34.9, adult: Secondary | ICD-10-CM | POA: Diagnosis not present

## 2016-02-19 DIAGNOSIS — I6350 Cerebral infarction due to unspecified occlusion or stenosis of unspecified cerebral artery: Secondary | ICD-10-CM | POA: Diagnosis not present

## 2016-02-19 DIAGNOSIS — E782 Mixed hyperlipidemia: Secondary | ICD-10-CM | POA: Diagnosis not present

## 2016-02-25 ENCOUNTER — Other Ambulatory Visit: Payer: Self-pay | Admitting: "Endocrinology

## 2016-03-02 ENCOUNTER — Other Ambulatory Visit: Payer: Self-pay | Admitting: "Endocrinology

## 2016-03-02 DIAGNOSIS — E1159 Type 2 diabetes mellitus with other circulatory complications: Secondary | ICD-10-CM | POA: Diagnosis not present

## 2016-03-02 LAB — COMPREHENSIVE METABOLIC PANEL
ALBUMIN: 4.1 g/dL (ref 3.6–5.1)
ALT: 39 U/L (ref 9–46)
AST: 22 U/L (ref 10–35)
Alkaline Phosphatase: 64 U/L (ref 40–115)
BILIRUBIN TOTAL: 0.7 mg/dL (ref 0.2–1.2)
BUN: 13 mg/dL (ref 7–25)
CO2: 27 mmol/L (ref 20–31)
CREATININE: 0.93 mg/dL (ref 0.70–1.25)
Calcium: 9.2 mg/dL (ref 8.6–10.3)
Chloride: 105 mmol/L (ref 98–110)
Glucose, Bld: 176 mg/dL — ABNORMAL HIGH (ref 65–99)
Potassium: 4.5 mmol/L (ref 3.5–5.3)
SODIUM: 142 mmol/L (ref 135–146)
TOTAL PROTEIN: 7.9 g/dL (ref 6.1–8.1)

## 2016-03-02 LAB — HEMOGLOBIN A1C
Hgb A1c MFr Bld: 8.2 % — ABNORMAL HIGH (ref <5.7)
MEAN PLASMA GLUCOSE: 189 mg/dL

## 2016-03-16 ENCOUNTER — Ambulatory Visit (INDEPENDENT_AMBULATORY_CARE_PROVIDER_SITE_OTHER): Payer: Medicare Other | Admitting: "Endocrinology

## 2016-03-16 ENCOUNTER — Encounter: Payer: Self-pay | Admitting: "Endocrinology

## 2016-03-16 VITALS — BP 123/72 | HR 81 | Ht 75.0 in | Wt 262.0 lb

## 2016-03-16 DIAGNOSIS — E1159 Type 2 diabetes mellitus with other circulatory complications: Secondary | ICD-10-CM

## 2016-03-16 DIAGNOSIS — E782 Mixed hyperlipidemia: Secondary | ICD-10-CM

## 2016-03-16 DIAGNOSIS — I10 Essential (primary) hypertension: Secondary | ICD-10-CM | POA: Diagnosis not present

## 2016-03-16 DIAGNOSIS — Z9189 Other specified personal risk factors, not elsewhere classified: Secondary | ICD-10-CM | POA: Diagnosis not present

## 2016-03-16 NOTE — Patient Instructions (Signed)

## 2016-03-16 NOTE — Progress Notes (Signed)
Subjective:    Patient ID: Alex Cortez, male    DOB: [birth year], PCP Curlene Labrum, MD   Past Medical History:  Diagnosis Date  . CAD (coronary artery disease)    nonobstructive CAD/patent LAD stent site; EF 65%, 08/2011; NSTEMI/DES LAD, 6/20 11  . Chest pain   . Diabetes mellitus    insulin-dependent  . Dyslipidemia   . HTN (hypertension)   . Post MI syndrome [facility]) 08-29-2009   Past Surgical History:  Procedure Laterality Date  . CORONARY ANGIOPLASTY WITH STENT PLACEMENT     Social History   Social History  . Marital status: Married    Spouse name: N/A  . Number of children: N/A  . Years of education: N/A   Social History Main Topics  . Smoking status: Former Smoker    Packs/day: 0.50    Years: 23.00    Types: Cigarettes    Start date: 03/16/1955    Quit date: 03/15/1988  . Smokeless tobacco: Never Used  . Alcohol use No  . Drug use: No  . Sexual activity: Not Asked   Other Topics Concern  . None   Social History Narrative   Works at [employer]. Married.    Outpatient Encounter Prescriptions as of 03/16/2016  Medication Sig  . amLODipine (NORVASC) 10 MG tablet Take 10 mg by mouth daily.    Marland Kitchen aspirin 325 MG tablet Take 325 mg by mouth daily.  Marland Kitchen atorvastatin (LIPITOR) 80 MG tablet Take 1 tablet (80 mg total) by mouth every evening.  . benazepril (LOTENSIN) 40 MG tablet Take 40 mg by mouth 2 (two) times daily.   . carvedilol (COREG) 25 MG tablet TAKE 1 TABLET (25 MG TOTAL) BY MOUTH 2 (TWO) TIMES DAILY.  . dapagliflozin propanediol (FARXIGA) 10 MG TABS tablet Take 10 mg by mouth every morning.  . Insulin Glulisine (APIDRA SOLOSTAR IJ) Inject 28-34 Units as directed 3 (three) times daily with meals.  . isosorbide mononitrate (IMDUR) 30 MG 24 hr tablet TAKE 1 TABLET BY MOUTH DAILY  . LEVEMIR FLEXTOUCH 100 UNIT/ML Pen INJECT 80 UNITS            SUBCUTANEOUSLY DAILY AT    10PM  . metFORMIN (GLUCOPHAGE) 1000 MG tablet TAKE 1 TABLET TWICE A DAY FOR DIABETES  .  nitroGLYCERIN (NITROSTAT) 0.4 MG SL tablet Place 1 tablet (0.4 mg total) under the tongue every 5 (five) minutes as needed.  . ONE TOUCH ULTRA TEST test strip USE TO TEST BLOOD SUGAR FOUR TIMES DAILY E11.65   No facility-administered encounter medications on file as of 03/16/2016.    ALLERGIES: Allergies  Allergen Reactions  . Shrimp [Shellfish Allergy]     Swelling and hives    VACCINATION STATUS:  There is no immunization history on file for this patient.  Diabetes  He presents for his follow-up diabetic visit. He has type 2 diabetes mellitus. Onset time: He was diagnosed at approximate age of 31 years. His disease course has been improving. There are no hypoglycemic associated symptoms. Pertinent negatives for hypoglycemia include no confusion, headaches, pallor or seizures. Pertinent negatives for diabetes include no chest pain, no fatigue, no polydipsia, no polyphagia, no polyuria and no weakness. There are no hypoglycemic complications. Symptoms are improving. Diabetic complications include a CVA. Risk factors for coronary artery disease include diabetes mellitus, dyslipidemia, hypertension, male sex, obesity and sedentary lifestyle. Current diabetic treatment includes intensive insulin program. He is compliant with treatment most of the time. His weight is  increasing steadily. He is following a generally unhealthy diet. When asked about meal planning, he reported none. He has not had a previous visit with a dietitian (He declined referral to a CDE for diabetes education.). His breakfast blood glucose range is generally 180-200 mg/dl. His lunch blood glucose range is generally 180-200 mg/dl. His dinner blood glucose range is generally 180-200 mg/dl. His overall blood glucose range is 180-200 mg/dl. An ACE inhibitor/angiotensin II receptor blocker is being taken. Eye exam is current.  Hyperlipidemia  This is a chronic problem. The current episode started more than 1 year ago. Exacerbating  diseases include diabetes and obesity. Pertinent negatives include no chest pain, leg pain, myalgias or shortness of breath. Current antihyperlipidemic treatment includes statins. Risk factors for coronary artery disease include diabetes mellitus, dyslipidemia, hypertension, male sex, obesity and a sedentary lifestyle.  Hypertension  This is a chronic problem. The current episode started more than 1 year ago. The problem is controlled. Pertinent negatives include no chest pain, headaches, neck pain, palpitations or shortness of breath. Past treatments include ACE inhibitors. Hypertensive end-organ damage includes CVA.    Review of Systems  Constitutional: Negative for fatigue and unexpected weight change.  HENT: Negative for dental problem, mouth sores and trouble swallowing.   Eyes: Negative for visual disturbance.  Respiratory: Negative for cough, choking, chest tightness, shortness of breath and wheezing.   Cardiovascular: Negative for chest pain, palpitations and leg swelling.  Gastrointestinal: Negative for abdominal distention, abdominal pain, constipation, diarrhea, nausea and vomiting.  Endocrine: Negative for polydipsia, polyphagia and polyuria.  Genitourinary: Negative for dysuria, flank pain, hematuria and urgency.  Musculoskeletal: Negative for back pain, gait problem, myalgias and neck pain.  Skin: Negative for pallor, rash and wound.  Neurological: Negative for seizures, syncope, weakness, numbness and headaches.  Psychiatric/Behavioral: Negative.  Negative for confusion and dysphoric mood.    Objective:    BP 123/72   Pulse 81   Ht 6\' 3"  (1.905 m)   Wt 262 lb (118.8 kg)   BMI 32.75 kg/m   Wt Readings from Last 3 Encounters:  03/16/16 262 lb (118.8 kg)  12/11/15 258 lb (117 kg)  09/09/15 255 lb (115.7 kg)    Physical Exam  Constitutional: He is oriented to person, place, and time. He appears well-developed and well-nourished. He is cooperative. No distress.  HENT:   Head: Normocephalic and atraumatic.  Eyes: EOM are normal.  Neck: Normal range of motion. Neck supple. No tracheal deviation present. No thyromegaly present.  Cardiovascular: Normal rate, S1 normal, S2 normal and normal heart sounds.  Exam reveals no gallop.   No murmur heard. Pulses:      Dorsalis pedis pulses are 1+ on the right side, and 1+ on the left side.       Posterior tibial pulses are 1+ on the right side, and 1+ on the left side.  Pulmonary/Chest: Breath sounds normal. No respiratory distress. He has no wheezes.  Abdominal: Bowel sounds are normal. He exhibits no distension. There is no tenderness. There is no guarding and no CVA tenderness.  He has abdominal lipodystrophy from insulin injections.   Musculoskeletal: He exhibits no edema.       Right shoulder: He exhibits no swelling and no deformity.  Neurological: He is alert and oriented to person, place, and time. He has normal strength and normal reflexes. No cranial nerve deficit or sensory deficit. Gait normal.  Skin: Skin is warm and dry. No rash noted. No cyanosis. Nails show no clubbing.  Psychiatric: He has a normal mood and affect. His speech is normal and behavior is normal. Judgment and thought content normal. Cognition and memory are normal.   Recent Results (from the past 2160 hour(s))  Lipid panel     Status: None   Collection Time: 02/17/16 12:00 AM  Result Value Ref Range   Triglycerides 120 40 - 160 mg/dL   Cholesterol 156 0 - 200 mg/dL   HDL 37 35 - 70 mg/dL   LDL Cholesterol 95 mg/dL  Comprehensive metabolic panel     Status: Abnormal   Collection Time: 03/02/16  7:49 AM  Result Value Ref Range   Sodium 142 135 - 146 mmol/L   Potassium 4.5 3.5 - 5.3 mmol/L   Chloride 105 98 - 110 mmol/L   CO2 27 20 - 31 mmol/L   Glucose, Bld 176 (H) 65 - 99 mg/dL   BUN 13 7 - 25 mg/dL   Creat 0.93 0.70 - 1.25 mg/dL    Comment:   For patients > or = 67 years of age: The upper reference limit for Creatinine is  approximately 13% higher for people identified as African-American.      Total Bilirubin 0.7 0.2 - 1.2 mg/dL   Alkaline Phosphatase 64 40 - 115 U/L   AST 22 10 - 35 U/L   ALT 39 9 - 46 U/L   Total Protein 7.9 6.1 - 8.1 g/dL   Albumin 4.1 3.6 - 5.1 g/dL   Calcium 9.2 8.6 - 10.3 mg/dL  Hemoglobin A1c     Status: Abnormal   Collection Time: 03/02/16  7:49 AM  Result Value Ref Range   Hgb A1c MFr Bld 8.2 (H) <5.7 %    Comment:   For someone without known diabetes, a hemoglobin A1c value of 6.5% or greater indicates that they may have diabetes and this should be confirmed with a follow-up test.   For someone with known diabetes, a value <7% indicates that their diabetes is well controlled and a value greater than or equal to 7% indicates suboptimal control. A1c targets should be individualized based on duration of diabetes, age, comorbid conditions, and other considerations.   Currently, no consensus exists for use of hemoglobin A1c for diagnosis of diabetes for children.      Mean Plasma Glucose 189 mg/dL     Assessment & Plan:   1. Type 2 diabetes mellitus with vascular disease (HCC)   -His  diabetes is  complicated by CVA and patient remains at a high risk for more acute and chronic complications of diabetes which include CAD, CVA, CKD, retinopathy, and neuropathy. These are all discussed in detail with the patient.  Patient came with Near target  glucose profile, and  recent A1c of 8.2% ,  Generally improving from 9.9% .  Glucose logs and insulin administration records pertaining to this visit,  to be scanned into patient's records.  Recent labs reviewed.   - I have re-counseled the patient on diet management and weight loss  by adopting a carbohydrate restricted / protein rich  Diet.  - Suggestion is made for patient to avoid simple carbohydrates   from their diet including Cakes , Desserts, Ice Cream,  Soda (  diet and regular) , Sweet Tea , Candies,  Chips, Cookies,  Artificial Sweeteners,   and "Sugar-free" Products .  This will help patient to have stable blood glucose profile and potentially avoid unintended  Weight gain.  - Patient is advised to stick to a  routine mealtimes to eat 3 meals  a day and avoid unnecessary snacks ( to snack only to correct hypoglycemia).  - The patient  declined referral to CDE for individualized DM education.  - I have approached patient with the following individualized plan to manage diabetes and patient agrees.  - Emphasizing the need for strict compliance, I advised him to continue Levemir 80 units daily at bedtime, increase Apidra to 28 units 3 times a day before meals, associated with monitoring of blood glucose before meals and at bedtime. Continue Farxiga 10 mg by mouth every morning. Continue metformin 1000 mg by mouth twice a day. -I have advised him on how he can rotate insulin injection sites on his abdominal skin to avoid worsening of the lipodystrophy. Patient is advised to call clinic if he registers blood glucose less than 70 mg/dL or greater than 300 mg/dL.  - He is open to consider bariatric surgery this time. I gave him brochure and contact number to initiate the process. However unfortunately he states that he was told he does not qualify for this procedure as of yet. - Patient specific target  for A1c; LDL, HDL, Triglycerides, and  Waist Circumference were discussed in detail.  2) BP/HTN:controlled. Continue current medications including ACEI/ARB. 3) Lipids/HPL:  continue statins. 4)  Weight/Diet:  He declined CDE consult , exercise, and carbohydrates information provided.  5) Chronic Care/Health Maintenance:  -Patient  Is  on ACEI/ARB and Statin medications and encouraged to continue to follow up with Ophthalmology, Podiatrist at least yearly or according to recommendations, and advised to  stay away from smoking. I have recommended yearly flu vaccine and pneumonia vaccination at least every 5 years;  moderate intensity exercise for up to 150 minutes weekly; and  sleep for at least 7 hours a day.  - 25 minutes of time was spent on the care of this patient , 50% of which was applied for counseling on diabetes complications and their preventions.  - I advised patient to maintain close follow up with Curlene Labrum, MD for primary care needs.  Patient is asked to bring meter and  blood glucose logs during their next visit.   Follow up plan: -Return in about 3 months (around 06/14/2016) for follow up with pre-visit labs, meter, and logs.  Glade Lloyd, MD Phone: [phone]  Fax: [phone]   03/16/2016, 9:35 AM

## 2016-03-25 DIAGNOSIS — Z7982 Long term (current) use of aspirin: Secondary | ICD-10-CM | POA: Diagnosis not present

## 2016-03-25 DIAGNOSIS — Z800 Family history of malignant neoplasm of digestive organs: Secondary | ICD-10-CM | POA: Diagnosis not present

## 2016-03-25 DIAGNOSIS — Z794 Long term (current) use of insulin: Secondary | ICD-10-CM | POA: Diagnosis not present

## 2016-03-25 DIAGNOSIS — D123 Benign neoplasm of transverse colon: Secondary | ICD-10-CM | POA: Diagnosis not present

## 2016-03-25 DIAGNOSIS — Z955 Presence of coronary angioplasty implant and graft: Secondary | ICD-10-CM | POA: Diagnosis not present

## 2016-03-25 DIAGNOSIS — Z79899 Other long term (current) drug therapy: Secondary | ICD-10-CM | POA: Diagnosis not present

## 2016-03-25 DIAGNOSIS — I252 Old myocardial infarction: Secondary | ICD-10-CM | POA: Diagnosis not present

## 2016-03-25 DIAGNOSIS — Z1211 Encounter for screening for malignant neoplasm of colon: Secondary | ICD-10-CM | POA: Diagnosis not present

## 2016-03-25 DIAGNOSIS — Z8673 Personal history of transient ischemic attack (TIA), and cerebral infarction without residual deficits: Secondary | ICD-10-CM | POA: Diagnosis not present

## 2016-03-25 DIAGNOSIS — E785 Hyperlipidemia, unspecified: Secondary | ICD-10-CM | POA: Diagnosis not present

## 2016-03-25 DIAGNOSIS — Z833 Family history of diabetes mellitus: Secondary | ICD-10-CM | POA: Diagnosis not present

## 2016-03-25 DIAGNOSIS — I10 Essential (primary) hypertension: Secondary | ICD-10-CM | POA: Diagnosis not present

## 2016-03-25 DIAGNOSIS — E119 Type 2 diabetes mellitus without complications: Secondary | ICD-10-CM | POA: Diagnosis not present

## 2016-04-07 DIAGNOSIS — D123 Benign neoplasm of transverse colon: Secondary | ICD-10-CM | POA: Diagnosis not present

## 2016-05-28 ENCOUNTER — Other Ambulatory Visit: Payer: Self-pay | Admitting: "Endocrinology

## 2016-06-02 ENCOUNTER — Encounter: Payer: Self-pay | Admitting: Cardiovascular Disease

## 2016-06-02 ENCOUNTER — Ambulatory Visit (INDEPENDENT_AMBULATORY_CARE_PROVIDER_SITE_OTHER): Payer: Medicare Other | Admitting: Cardiovascular Disease

## 2016-06-02 VITALS — BP 112/66 | HR 73 | Ht 75.0 in | Wt 260.0 lb

## 2016-06-02 DIAGNOSIS — I209 Angina pectoris, unspecified: Secondary | ICD-10-CM

## 2016-06-02 DIAGNOSIS — I10 Essential (primary) hypertension: Secondary | ICD-10-CM

## 2016-06-02 DIAGNOSIS — E7800 Pure hypercholesterolemia, unspecified: Secondary | ICD-10-CM | POA: Diagnosis not present

## 2016-06-02 DIAGNOSIS — I25708 Atherosclerosis of coronary artery bypass graft(s), unspecified, with other forms of angina pectoris: Secondary | ICD-10-CM | POA: Diagnosis not present

## 2016-06-02 DIAGNOSIS — E119 Type 2 diabetes mellitus without complications: Secondary | ICD-10-CM

## 2016-06-02 DIAGNOSIS — Z955 Presence of coronary angioplasty implant and graft: Secondary | ICD-10-CM

## 2016-06-02 DIAGNOSIS — Z8673 Personal history of transient ischemic attack (TIA), and cerebral infarction without residual deficits: Secondary | ICD-10-CM

## 2016-06-02 NOTE — Progress Notes (Signed)
SUBJECTIVE: The patient presents for routine follow up. He has a history of coronary artery disease with a previously placed mid LAD drug-eluting stent in June 2011. His most recent coronary angiogram was in June 2013 which revealed a widely patent stent. He also has hypertension, hyperlipidemia, CVA in 10/2012, and insulin-dependent diabetes mellitus.  He denies chest pain. He has chronic exertional dyspnea which is unchanged. Denies leg swelling. He has difficulty exercising due to bilateral hip pain.  ECG performed in the office today which I personally reviewed demonstrates normal sinus rhythm with no ischemic ST segment or T-wave abnormalities, nor any arrhythmias.   Review of Systems: As per "subjective", otherwise negative.  Allergies  Allergen Reactions  . Shrimp [Shellfish Allergy]     Swelling and hives     Current Outpatient Prescriptions  Medication Sig Dispense Refill  . amLODipine (NORVASC) 10 MG tablet Take 10 mg by mouth daily.      Marland Kitchen aspirin 325 MG tablet Take 325 mg by mouth daily.    Marland Kitchen atorvastatin (LIPITOR) 80 MG tablet Take 1 tablet (80 mg total) by mouth every evening. 90 tablet 3  . benazepril (LOTENSIN) 40 MG tablet Take 40 mg by mouth 2 (two) times daily.     . carvedilol (COREG) 25 MG tablet TAKE 1 TABLET (25 MG TOTAL) BY MOUTH 2 (TWO) TIMES DAILY. 180 tablet 3  . dapagliflozin propanediol (FARXIGA) 10 MG TABS tablet Take 10 mg by mouth every morning. 30 tablet 3  . docusate sodium (COLACE) 100 MG capsule Take 100 mg by mouth daily.    . Insulin Glulisine (APIDRA SOLOSTAR IJ) Inject 28-34 Units as directed 3 (three) times daily with meals.    . isosorbide mononitrate (IMDUR) 30 MG 24 hr tablet TAKE 1 TABLET BY MOUTH DAILY 30 tablet 4  . LEVEMIR FLEXTOUCH 100 UNIT/ML Pen INJECT 80 UNITS            SUBCUTANEOUSLY DAILY AT    10PM 75 mL 2  . metFORMIN (GLUCOPHAGE) 1000 MG tablet TAKE 1 TABLET TWICE A DAY FOR DIABETES  3  . nitroGLYCERIN (NITROSTAT) 0.4 MG  SL tablet Place 1 tablet (0.4 mg total) under the tongue every 5 (five) minutes as needed. 25 tablet 3  . ONE TOUCH ULTRA TEST test strip USE TO TEST BLOOD SUGAR FOUR TIMES DAILY E11.65 150 each 5   No current facility-administered medications for this visit.     Past Medical History:  Diagnosis Date  . CAD (coronary artery disease)    nonobstructive CAD/patent LAD stent site; EF 65%, 08/2011; NSTEMI/DES LAD, 6/20 11  . Chest pain   . Diabetes mellitus    insulin-dependent  . Dyslipidemia   . HTN (hypertension)   . Post MI syndrome [facility]) 08-29-2009    Past Surgical History:  Procedure Laterality Date  . CORONARY ANGIOPLASTY WITH STENT PLACEMENT      Social History   Social History  . Marital status: Married    Spouse name: N/A  . Number of children: N/A  . Years of education: N/A   Occupational History  . Not on file.   Social History Main Topics  . Smoking status: Former Smoker    Packs/day: 0.50    Years: 23.00    Types: Cigarettes    Start date: 03/16/1955    Quit date: 03/15/1988  . Smokeless tobacco: Never Used  . Alcohol use No  . Drug use: No  . Sexual activity: Not on file  Other Topics Concern  . Not on file   Social History Narrative   Works at [employer]. Married.      Vitals:   06/02/16 0946  BP: 112/66  Pulse: 73  Weight: 260 lb (117.9 kg)  Height: 6\' 3"  (1.905 m)    PHYSICAL EXAM General: NAD HEENT: Normal. Neck: No JVD, no thyromegaly. Lungs: Clear to auscultation bilaterally with normal respiratory effort. CV: Nondisplaced PMI.  Regular rate and rhythm, normal S1/S2, no S3/S4, no murmur. No pretibial or periankle edema.  No carotid bruit.   Abdomen: Obese, firm, nontender.  Neurologic: Alert and oriented.  Psych: Normal affect. Skin: Normal. Musculoskeletal: No gross deformities.    ECG: Most recent ECG reviewed.      ASSESSMENT AND PLAN: 1. CAD: Remains quiescent on medical therapy, reporting no recurrent CP since  undergoing PCI of the mid-LAD in June 2011. His most recent coronary angiogram in 08/2011 showed a widely patent stent.  Continue ASA (on 325 mg for h/o CVA), Coreg, Imdur, and Lipitor.  2. Hypertension: Controlled. No changes.  3. Hyperlipidemia: Continue statin.  4. IDDM: HbA1C 8.2% on 03/02/16. Followed by PCP and endocrinology.   Dispo: fu 1 year.   Kate Sable, M.D., F.A.C.C.

## 2016-06-02 NOTE — Patient Instructions (Signed)

## 2016-06-08 ENCOUNTER — Other Ambulatory Visit: Payer: Self-pay | Admitting: "Endocrinology

## 2016-06-08 DIAGNOSIS — E1159 Type 2 diabetes mellitus with other circulatory complications: Secondary | ICD-10-CM | POA: Diagnosis not present

## 2016-06-08 LAB — LIPID PANEL
CHOLESTEROL: 138 mg/dL (ref <200)
HDL: 32 mg/dL — ABNORMAL LOW (ref >40)
LDL Cholesterol: 84 mg/dL (ref <100)
TRIGLYCERIDES: 111 mg/dL (ref <150)
Total CHOL/HDL Ratio: 4.3 Ratio (ref <5.0)
VLDL: 22 mg/dL (ref <30)

## 2016-06-08 LAB — COMPREHENSIVE METABOLIC PANEL
ALBUMIN: 4 g/dL (ref 3.6–5.1)
ALK PHOS: 70 U/L (ref 40–115)
ALT: 45 U/L (ref 9–46)
AST: 25 U/L (ref 10–35)
BUN: 17 mg/dL (ref 7–25)
CHLORIDE: 105 mmol/L (ref 98–110)
CO2: 25 mmol/L (ref 20–31)
CREATININE: 1 mg/dL (ref 0.70–1.25)
Calcium: 9.2 mg/dL (ref 8.6–10.3)
Glucose, Bld: 157 mg/dL — ABNORMAL HIGH (ref 65–99)
POTASSIUM: 4.1 mmol/L (ref 3.5–5.3)
SODIUM: 138 mmol/L (ref 135–146)
TOTAL PROTEIN: 7.7 g/dL (ref 6.1–8.1)
Total Bilirubin: 0.7 mg/dL (ref 0.2–1.2)

## 2016-06-08 LAB — MICROALBUMIN / CREATININE URINE RATIO
Creatinine, Urine: 66 mg/dL (ref 20–370)
Microalb Creat Ratio: 47 mcg/mg creat — ABNORMAL HIGH (ref <30)
Microalb, Ur: 3.1 mg/dL

## 2016-06-09 LAB — HEMOGLOBIN A1C
Hgb A1c MFr Bld: 8 % — ABNORMAL HIGH (ref <5.7)
Mean Plasma Glucose: 183 mg/dL

## 2016-06-15 ENCOUNTER — Ambulatory Visit (INDEPENDENT_AMBULATORY_CARE_PROVIDER_SITE_OTHER): Payer: Medicare Other | Admitting: "Endocrinology

## 2016-06-15 ENCOUNTER — Encounter: Payer: Self-pay | Admitting: "Endocrinology

## 2016-06-15 VITALS — BP 159/89 | HR 77 | Ht 75.0 in | Wt 260.0 lb

## 2016-06-15 DIAGNOSIS — I25708 Atherosclerosis of coronary artery bypass graft(s), unspecified, with other forms of angina pectoris: Secondary | ICD-10-CM | POA: Diagnosis not present

## 2016-06-15 DIAGNOSIS — I10 Essential (primary) hypertension: Secondary | ICD-10-CM

## 2016-06-15 DIAGNOSIS — E6609 Other obesity due to excess calories: Secondary | ICD-10-CM

## 2016-06-15 DIAGNOSIS — E782 Mixed hyperlipidemia: Secondary | ICD-10-CM | POA: Diagnosis not present

## 2016-06-15 DIAGNOSIS — E1159 Type 2 diabetes mellitus with other circulatory complications: Secondary | ICD-10-CM | POA: Diagnosis not present

## 2016-06-15 DIAGNOSIS — Z6832 Body mass index (BMI) 32.0-32.9, adult: Secondary | ICD-10-CM

## 2016-06-15 NOTE — Patient Instructions (Signed)

## 2016-06-15 NOTE — Progress Notes (Signed)
Subjective:    Patient ID: Alex Cortez, male    DOB: [birth year], PCP Curlene Labrum, MD   Past Medical History:  Diagnosis Date  . CAD (coronary artery disease)    nonobstructive CAD/patent LAD stent site; EF 65%, 08/2011; NSTEMI/DES LAD, 6/20 11  . Chest pain   . Diabetes mellitus    insulin-dependent  . Dyslipidemia   . HTN (hypertension)   . Post MI syndrome [facility]) 08-29-2009   Past Surgical History:  Procedure Laterality Date  . CORONARY ANGIOPLASTY WITH STENT PLACEMENT     Social History   Social History  . Marital status: Married    Spouse name: N/A  . Number of children: N/A  . Years of education: N/A   Social History Main Topics  . Smoking status: Former Smoker    Packs/day: 0.50    Years: 23.00    Types: Cigarettes    Start date: 03/16/1955    Quit date: 03/15/1988  . Smokeless tobacco: Never Used  . Alcohol use No  . Drug use: No  . Sexual activity: Not Asked   Other Topics Concern  . None   Social History Narrative   Works at [employer]. Married.    Outpatient Encounter Prescriptions as of 06/15/2016  Medication Sig  . amLODipine (NORVASC) 10 MG tablet Take 10 mg by mouth daily.    Marland Kitchen aspirin 325 MG tablet Take 325 mg by mouth daily.  Marland Kitchen atorvastatin (LIPITOR) 80 MG tablet Take 1 tablet (80 mg total) by mouth every evening.  . benazepril (LOTENSIN) 40 MG tablet Take 40 mg by mouth 2 (two) times daily.   . carvedilol (COREG) 25 MG tablet TAKE 1 TABLET (25 MG TOTAL) BY MOUTH 2 (TWO) TIMES DAILY.  . dapagliflozin propanediol (FARXIGA) 10 MG TABS tablet Take 10 mg by mouth every morning.  . docusate sodium (COLACE) 100 MG capsule Take 100 mg by mouth daily.  . Insulin Glulisine (APIDRA SOLOSTAR IJ) Inject 28-34 Units as directed 3 (three) times daily with meals.  . isosorbide mononitrate (IMDUR) 30 MG 24 hr tablet TAKE 1 TABLET BY MOUTH DAILY  . LEVEMIR FLEXTOUCH 100 UNIT/ML Pen INJECT 80 UNITS            SUBCUTANEOUSLY DAILY AT    10PM  . metFORMIN  (GLUCOPHAGE) 1000 MG tablet TAKE 1 TABLET TWICE A DAY FOR DIABETES  . nitroGLYCERIN (NITROSTAT) 0.4 MG SL tablet Place 1 tablet (0.4 mg total) under the tongue every 5 (five) minutes as needed.  . ONE TOUCH ULTRA TEST test strip USE TO TEST BLOOD SUGAR FOUR TIMES DAILY E11.65   No facility-administered encounter medications on file as of 06/15/2016.    ALLERGIES: Allergies  Allergen Reactions  . Shrimp [Shellfish Allergy]     Swelling and hives    VACCINATION STATUS:  There is no immunization history on file for this patient.  Diabetes  He presents for his follow-up diabetic visit. He has type 2 diabetes mellitus. Onset time: He was diagnosed at approximate age of 32 years. His disease course has been improving. There are no hypoglycemic associated symptoms. Pertinent negatives for hypoglycemia include no confusion, headaches, pallor or seizures. Pertinent negatives for diabetes include no chest pain, no fatigue, no polydipsia, no polyphagia, no polyuria and no weakness. There are no hypoglycemic complications. Symptoms are improving. Diabetic complications include a CVA. Risk factors for coronary artery disease include diabetes mellitus, dyslipidemia, hypertension, male sex, obesity and sedentary lifestyle. Current diabetic treatment includes intensive  insulin program. He is compliant with treatment most of the time. His weight is decreasing steadily. He is following a generally unhealthy diet. When asked about meal planning, he reported none. He has not had a previous visit with a dietitian (He declined referral to a CDE for diabetes education.). His breakfast blood glucose range is generally 140-180 mg/dl. His lunch blood glucose range is generally 140-180 mg/dl. His dinner blood glucose range is generally 140-180 mg/dl. His overall blood glucose range is 140-180 mg/dl. An ACE inhibitor/angiotensin II receptor blocker is being taken. Eye exam is current.  Hyperlipidemia  This is a chronic  problem. The current episode started more than 1 year ago. Exacerbating diseases include diabetes and obesity. Pertinent negatives include no chest pain, leg pain, myalgias or shortness of breath. Current antihyperlipidemic treatment includes statins. Risk factors for coronary artery disease include diabetes mellitus, dyslipidemia, hypertension, male sex, obesity and a sedentary lifestyle.  Hypertension  This is a chronic problem. The current episode started more than 1 year ago. The problem is controlled. Pertinent negatives include no chest pain, headaches, neck pain, palpitations or shortness of breath. Past treatments include ACE inhibitors. Hypertensive end-organ damage includes CVA.    Review of Systems  Constitutional: Negative for fatigue and unexpected weight change.  HENT: Negative for dental problem, mouth sores and trouble swallowing.   Eyes: Negative for visual disturbance.  Respiratory: Negative for cough, choking, chest tightness, shortness of breath and wheezing.   Cardiovascular: Negative for chest pain, palpitations and leg swelling.  Gastrointestinal: Negative for abdominal distention, abdominal pain, constipation, diarrhea, nausea and vomiting.  Endocrine: Negative for polydipsia, polyphagia and polyuria.  Genitourinary: Negative for dysuria, flank pain, hematuria and urgency.  Musculoskeletal: Negative for back pain, gait problem, myalgias and neck pain.  Skin: Negative for pallor, rash and wound.  Neurological: Negative for seizures, syncope, weakness, numbness and headaches.  Psychiatric/Behavioral: Negative.  Negative for confusion and dysphoric mood.    Objective:    BP (!) 159/89   Pulse 77   Ht 6\' 3"  (1.905 m)   Wt 260 lb (117.9 kg)   BMI 32.50 kg/m   Wt Readings from Last 3 Encounters:  06/15/16 260 lb (117.9 kg)  06/02/16 260 lb (117.9 kg)  03/16/16 262 lb (118.8 kg)    Physical Exam  Constitutional: He is oriented to person, place, and time. He appears  well-developed and well-nourished. He is cooperative. No distress.  HENT:  Head: Normocephalic and atraumatic.  Eyes: EOM are normal.  Neck: Normal range of motion. Neck supple. No tracheal deviation present. No thyromegaly present.  Cardiovascular: Normal rate, S1 normal, S2 normal and normal heart sounds.  Exam reveals no gallop.   No murmur heard. Pulses:      Dorsalis pedis pulses are 1+ on the right side, and 1+ on the left side.       Posterior tibial pulses are 1+ on the right side, and 1+ on the left side.  Pulmonary/Chest: Breath sounds normal. No respiratory distress. He has no wheezes.  Abdominal: Bowel sounds are normal. He exhibits no distension. There is no tenderness. There is no guarding and no CVA tenderness.  He has abdominal lipodystrophy from insulin injections.   Musculoskeletal: He exhibits no edema.       Right shoulder: He exhibits no swelling and no deformity.  Neurological: He is alert and oriented to person, place, and time. He has normal strength and normal reflexes. No cranial nerve deficit or sensory deficit. Gait normal.  Skin: Skin is warm and dry. No rash noted. No cyanosis. Nails show no clubbing.  Psychiatric: He has a normal mood and affect. His speech is normal and behavior is normal. Judgment and thought content normal. Cognition and memory are normal.   Recent Results (from the past 2160 hour(s))  Microalbumin / creatinine urine ratio     Status: Abnormal   Collection Time: 06/08/16  7:38 AM  Result Value Ref Range   Creatinine, Urine 66 20 - 370 mg/dL   Microalb, Ur 3.1 Not estab mg/dL   Microalb Creat Ratio 47 (H) <30 mcg/mg creat    Comment: The ADA has defined abnormalities in albumin excretion as follows:           Category           Result                            (mcg/mg creatinine)                 Normal:    <30       Microalbuminuria:    30 - 299   Clinical albuminuria:    > or = 300   The ADA recommends that at least two of three  specimens collected within a 3 - 6 month period be abnormal before considering a patient to be within a diagnostic category.     Comprehensive metabolic panel     Status: Abnormal   Collection Time: 06/08/16  7:38 AM  Result Value Ref Range   Sodium 138 135 - 146 mmol/L   Potassium 4.1 3.5 - 5.3 mmol/L   Chloride 105 98 - 110 mmol/L   CO2 25 20 - 31 mmol/L   Glucose, Bld 157 (H) 65 - 99 mg/dL   BUN 17 7 - 25 mg/dL   Creat 1.00 0.70 - 1.25 mg/dL    Comment:   For patients > or = 67 years of age: The upper reference limit for Creatinine is approximately 13% higher for people identified as African-American.      Total Bilirubin 0.7 0.2 - 1.2 mg/dL   Alkaline Phosphatase 70 40 - 115 U/L   AST 25 10 - 35 U/L   ALT 45 9 - 46 U/L   Total Protein 7.7 6.1 - 8.1 g/dL   Albumin 4.0 3.6 - 5.1 g/dL   Calcium 9.2 8.6 - 10.3 mg/dL  Lipid panel     Status: Abnormal   Collection Time: 06/08/16  7:38 AM  Result Value Ref Range   Cholesterol 138 <200 mg/dL   Triglycerides 111 <150 mg/dL   HDL 32 (L) >40 mg/dL   Total CHOL/HDL Ratio 4.3 <5.0 Ratio   VLDL 22 <30 mg/dL   LDL Cholesterol 84 <100 mg/dL  Hemoglobin A1c     Status: Abnormal   Collection Time: 06/08/16  7:38 AM  Result Value Ref Range   Hgb A1c MFr Bld 8.0 (H) <5.7 %    Comment:   For someone without known diabetes, a hemoglobin A1c value of 6.5% or greater indicates that they may have diabetes and this should be confirmed with a follow-up test.   For someone with known diabetes, a value <7% indicates that their diabetes is well controlled and a value greater than or equal to 7% indicates suboptimal control. A1c targets should be individualized based on duration of diabetes, age, comorbid conditions, and other considerations.   Currently, no consensus  exists for use of hemoglobin A1c for diagnosis of diabetes for children.      Mean Plasma Glucose 183 mg/dL     Assessment & Plan:   1. Type 2 diabetes mellitus with  vascular disease (HCC)   -His  diabetes is  complicated by CVA and patient remains at a high risk for more acute and chronic complications of diabetes which include CAD, CVA, CKD, retinopathy, and neuropathy. These are all discussed in detail with the patient.  Patient came with target  glucose profile averaging 178, and  recent A1c of 8% ,  Generally improving from 9.9% .  Glucose logs and insulin administration records pertaining to this visit,  to be scanned into patient's records.  Recent labs reviewed.   - I have re-counseled the patient on diet management and weight loss  by adopting a carbohydrate restricted / protein rich  Diet.  - Suggestion is made for patient to avoid simple carbohydrates   from their diet including Cakes , Desserts, Ice Cream,  Soda (  diet and regular) , Sweet Tea , Candies,  Chips, Cookies, Artificial Sweeteners,   and "Sugar-free" Products .  This will help patient to have stable blood glucose profile and potentially avoid unintended  Weight gain.  - Patient is advised to stick to a routine mealtimes to eat 3 meals  a day and avoid unnecessary snacks ( to snack only to correct hypoglycemia).  - The patient  declined referral to CDE for individualized DM education.  - I have approached patient with the following individualized plan to manage diabetes and patient agrees.  - Emphasizing the need for strict compliance, I advised him to continue Levemir 80 units daily at bedtime,  Apidra  28 units 3 times a day before meals, associated with monitoring of blood glucose before meals and at bedtime. Continue Farxiga 10 mg by mouth every morning. Continue metformin 1000 mg by mouth twice a day. -I have advised him on how he can rotate insulin injection sites on his abdominal skin to avoid worsening of the lipodystrophy. Patient is advised to call clinic if he registers blood glucose less than 70 mg/dL or greater than 300 mg/dL.  - He reports that he did not qualify for  bariatric surgery for Medicare coverage due to the fact that he is BMI is 32. - Patient specific target  for A1c; LDL, HDL, Triglycerides, and  Waist Circumference were discussed in detail.  2) BP/HTN:controlled. Continue current medications including ACEI/ARB. 3) Lipids/HPL:  continue statins. 4)  Weight/Diet:  He declined CDE consult , exercise, and carbohydrates information provided.  5) Chronic Care/Health Maintenance:  -Patient  Is  on ACEI/ARB and Statin medications and encouraged to continue to follow up with Ophthalmology, Podiatrist at least yearly or according to recommendations, and advised to  stay away from smoking. I have recommended yearly flu vaccine and pneumonia vaccination at least every 5 years; moderate intensity exercise for up to 150 minutes weekly; and  sleep for at least 7 hours a day.  - 25 minutes of time was spent on the care of this patient , 50% of which was applied for counseling on diabetes complications and their preventions.  - I advised patient to maintain close follow up with Curlene Labrum, MD for primary care needs.  Patient is asked to bring meter and  blood glucose logs during their next visit.   Follow up plan: -Return in about 3 months (around 09/14/2016) for follow up with pre-visit labs, meter,  and logs.  Glade Lloyd, MD Phone: [phone]  Fax: [phone]   06/15/2016, 9:10 AM

## 2016-06-23 ENCOUNTER — Other Ambulatory Visit: Payer: Self-pay | Admitting: "Endocrinology

## 2016-08-10 DIAGNOSIS — E1165 Type 2 diabetes mellitus with hyperglycemia: Secondary | ICD-10-CM | POA: Diagnosis not present

## 2016-08-10 DIAGNOSIS — I10 Essential (primary) hypertension: Secondary | ICD-10-CM | POA: Diagnosis not present

## 2016-08-10 DIAGNOSIS — E782 Mixed hyperlipidemia: Secondary | ICD-10-CM | POA: Diagnosis not present

## 2016-08-10 DIAGNOSIS — R5383 Other fatigue: Secondary | ICD-10-CM | POA: Diagnosis not present

## 2016-08-13 DIAGNOSIS — E782 Mixed hyperlipidemia: Secondary | ICD-10-CM | POA: Diagnosis not present

## 2016-08-13 DIAGNOSIS — E1165 Type 2 diabetes mellitus with hyperglycemia: Secondary | ICD-10-CM | POA: Diagnosis not present

## 2016-08-13 DIAGNOSIS — Z1389 Encounter for screening for other disorder: Secondary | ICD-10-CM | POA: Diagnosis not present

## 2016-08-13 DIAGNOSIS — I6350 Cerebral infarction due to unspecified occlusion or stenosis of unspecified cerebral artery: Secondary | ICD-10-CM | POA: Diagnosis not present

## 2016-08-13 DIAGNOSIS — Z23 Encounter for immunization: Secondary | ICD-10-CM | POA: Diagnosis not present

## 2016-08-13 DIAGNOSIS — I10 Essential (primary) hypertension: Secondary | ICD-10-CM | POA: Diagnosis not present

## 2016-08-13 DIAGNOSIS — I240 Acute coronary thrombosis not resulting in myocardial infarction: Secondary | ICD-10-CM | POA: Diagnosis not present

## 2016-08-13 DIAGNOSIS — D126 Benign neoplasm of colon, unspecified: Secondary | ICD-10-CM | POA: Diagnosis not present

## 2016-09-22 ENCOUNTER — Other Ambulatory Visit: Payer: Self-pay | Admitting: "Endocrinology

## 2016-09-22 DIAGNOSIS — E1159 Type 2 diabetes mellitus with other circulatory complications: Secondary | ICD-10-CM | POA: Diagnosis not present

## 2016-09-22 LAB — COMPREHENSIVE METABOLIC PANEL
ALK PHOS: 76 U/L (ref 40–115)
ALT: 45 U/L (ref 9–46)
AST: 26 U/L (ref 10–35)
Albumin: 4 g/dL (ref 3.6–5.1)
BILIRUBIN TOTAL: 0.9 mg/dL (ref 0.2–1.2)
BUN: 16 mg/dL (ref 7–25)
CALCIUM: 9.3 mg/dL (ref 8.6–10.3)
CO2: 22 mmol/L (ref 20–31)
CREATININE: 1.04 mg/dL (ref 0.70–1.25)
Chloride: 107 mmol/L (ref 98–110)
Glucose, Bld: 134 mg/dL — ABNORMAL HIGH (ref 65–99)
Potassium: 4.6 mmol/L (ref 3.5–5.3)
SODIUM: 142 mmol/L (ref 135–146)
TOTAL PROTEIN: 7.6 g/dL (ref 6.1–8.1)

## 2016-09-23 LAB — HEMOGLOBIN A1C
HEMOGLOBIN A1C: 9 % — AB (ref <5.7)
MEAN PLASMA GLUCOSE: 212 mg/dL

## 2016-09-28 ENCOUNTER — Encounter: Payer: Self-pay | Admitting: "Endocrinology

## 2016-09-28 ENCOUNTER — Ambulatory Visit (INDEPENDENT_AMBULATORY_CARE_PROVIDER_SITE_OTHER): Payer: Medicare Other | Admitting: "Endocrinology

## 2016-09-28 VITALS — BP 138/76 | HR 80 | Ht 75.0 in | Wt 259.0 lb

## 2016-09-28 DIAGNOSIS — E782 Mixed hyperlipidemia: Secondary | ICD-10-CM | POA: Diagnosis not present

## 2016-09-28 DIAGNOSIS — I10 Essential (primary) hypertension: Secondary | ICD-10-CM | POA: Diagnosis not present

## 2016-09-28 DIAGNOSIS — Z6832 Body mass index (BMI) 32.0-32.9, adult: Secondary | ICD-10-CM | POA: Diagnosis not present

## 2016-09-28 DIAGNOSIS — E1159 Type 2 diabetes mellitus with other circulatory complications: Secondary | ICD-10-CM | POA: Diagnosis not present

## 2016-09-28 DIAGNOSIS — E6609 Other obesity due to excess calories: Secondary | ICD-10-CM | POA: Diagnosis not present

## 2016-09-28 DIAGNOSIS — I25708 Atherosclerosis of coronary artery bypass graft(s), unspecified, with other forms of angina pectoris: Secondary | ICD-10-CM

## 2016-09-28 NOTE — Patient Instructions (Signed)

## 2016-09-28 NOTE — Progress Notes (Signed)
Subjective:    Patient ID: Alex Cortez, male    DOB: 01/21/1951, PCP Burdine, Virgina Evener, MD   Past Medical History:  Diagnosis Date  . CAD (coronary artery disease)    nonobstructive CAD/patent LAD stent site; EF 65%, 08/2011; NSTEMI/DES LAD, 6/20 11  . Chest pain   . Diabetes mellitus    insulin-dependent  . Dyslipidemia   . HTN (hypertension)   . Post MI syndrome [facility]) 08-29-2009   Past Surgical History:  Procedure Laterality Date  . CORONARY ANGIOPLASTY WITH STENT PLACEMENT     Social History   Social History  . Marital status: Married    Spouse name: N/A  . Number of children: N/A  . Years of education: N/A   Social History Main Topics  . Smoking status: Former Smoker    Packs/day: 0.50    Years: 23.00    Types: Cigarettes    Start date: 03/16/1955    Quit date: 03/15/1988  . Smokeless tobacco: Never Used  . Alcohol use No  . Drug use: No  . Sexual activity: Not Asked   Other Topics Concern  . None   Social History Narrative   Works at [employer]. Married.    Outpatient Encounter Prescriptions as of 09/28/2016  Medication Sig  . amLODipine (NORVASC) 10 MG tablet Take 10 mg by mouth daily.    Marland Kitchen aspirin 325 MG tablet Take 325 mg by mouth daily.  Marland Kitchen atorvastatin (LIPITOR) 80 MG tablet Take 1 tablet (80 mg total) by mouth every evening.  . benazepril (LOTENSIN) 40 MG tablet Take 40 mg by mouth 2 (two) times daily.   . carvedilol (COREG) 25 MG tablet TAKE 1 TABLET (25 MG TOTAL) BY MOUTH 2 (TWO) TIMES DAILY.  Marland Kitchen docusate sodium (COLACE) 100 MG capsule Take 100 mg by mouth daily.  Marland Kitchen FARXIGA 10 MG TABS tablet TAKE 1 TABLET BY MOUTH EVERY MORNING  . Insulin Glulisine (APIDRA SOLOSTAR IJ) Inject 30-36 Units as directed 3 (three) times daily with meals.  . isosorbide mononitrate (IMDUR) 30 MG 24 hr tablet TAKE 1 TABLET BY MOUTH DAILY  . LEVEMIR FLEXTOUCH 100 UNIT/ML Pen INJECT 80 UNITS            SUBCUTANEOUSLY DAILY AT    10PM  . metFORMIN (GLUCOPHAGE) 1000 MG  tablet TAKE 1 TABLET TWICE A DAY FOR DIABETES  . nitroGLYCERIN (NITROSTAT) 0.4 MG SL tablet Place 1 tablet (0.4 mg total) under the tongue every 5 (five) minutes as needed.  . ONE TOUCH ULTRA TEST test strip USE TO TEST BLOOD SUGAR FOUR TIMES DAILY E11.65   No facility-administered encounter medications on file as of 09/28/2016.    ALLERGIES: Allergies  Allergen Reactions  . Shrimp [Shellfish Allergy]     Swelling and hives    VACCINATION STATUS:  There is no immunization history on file for this patient.  Diabetes  He presents for his follow-up diabetic visit. He has type 2 diabetes mellitus. Onset time: He was diagnosed at approximate age of 1 years. His disease course has been worsening. There are no hypoglycemic associated symptoms. Pertinent negatives for hypoglycemia include no confusion, headaches, pallor or seizures. Pertinent negatives for diabetes include no chest pain, no fatigue, no polydipsia, no polyphagia, no polyuria and no weakness. There are no hypoglycemic complications. Symptoms are worsening. Diabetic complications include a CVA. Risk factors for coronary artery disease include diabetes mellitus, dyslipidemia, hypertension, male sex, obesity and sedentary lifestyle. Current diabetic treatment includes intensive insulin  program. He is compliant with treatment most of the time. His weight is stable. He is following a generally unhealthy diet. When asked about meal planning, he reported none. He has not had a previous visit with a dietitian (He declined referral to a CDE for diabetes education.). His breakfast blood glucose range is generally 140-180 mg/dl. His lunch blood glucose range is generally 180-200 mg/dl. His dinner blood glucose range is generally 180-200 mg/dl. His overall blood glucose range is 180-200 mg/dl. An ACE inhibitor/angiotensin II receptor blocker is being taken. Eye exam is current.  Hyperlipidemia  This is a chronic problem. The current episode started  more than 1 year ago. Exacerbating diseases include diabetes and obesity. Pertinent negatives include no chest pain, leg pain, myalgias or shortness of breath. Current antihyperlipidemic treatment includes statins. Risk factors for coronary artery disease include diabetes mellitus, dyslipidemia, hypertension, male sex, obesity and a sedentary lifestyle.  Hypertension  This is a chronic problem. The current episode started more than 1 year ago. The problem is controlled. Pertinent negatives include no chest pain, headaches, neck pain, palpitations or shortness of breath. Past treatments include ACE inhibitors. Hypertensive end-organ damage includes CVA.    Review of Systems  Constitutional: Negative for fatigue and unexpected weight change.  HENT: Negative for dental problem, mouth sores and trouble swallowing.   Eyes: Negative for visual disturbance.  Respiratory: Negative for cough, choking, chest tightness, shortness of breath and wheezing.   Cardiovascular: Negative for chest pain, palpitations and leg swelling.  Gastrointestinal: Negative for abdominal distention, abdominal pain, constipation, diarrhea, nausea and vomiting.  Endocrine: Negative for polydipsia, polyphagia and polyuria.  Genitourinary: Negative for dysuria, flank pain, hematuria and urgency.  Musculoskeletal: Negative for back pain, gait problem, myalgias and neck pain.  Skin: Negative for pallor, rash and wound.  Neurological: Negative for seizures, syncope, weakness, numbness and headaches.  Psychiatric/Behavioral: Negative.  Negative for confusion and dysphoric mood.    Objective:    BP 138/76   Pulse 80   Ht 6\' 3"  (1.905 m)   Wt 259 lb (117.5 kg)   BMI 32.37 kg/m   Wt Readings from Last 3 Encounters:  09/28/16 259 lb (117.5 kg)  06/15/16 260 lb (117.9 kg)  06/02/16 260 lb (117.9 kg)    Physical Exam  Constitutional: He is oriented to person, place, and time. He appears well-developed and well-nourished. He is  cooperative. No distress.  HENT:  Head: Normocephalic and atraumatic.  Eyes: EOM are normal.  Neck: Normal range of motion. Neck supple. No tracheal deviation present. No thyromegaly present.  Cardiovascular: Normal rate, S1 normal, S2 normal and normal heart sounds.  Exam reveals no gallop.   No murmur heard. Pulses:      Dorsalis pedis pulses are 1+ on the right side, and 1+ on the left side.       Posterior tibial pulses are 1+ on the right side, and 1+ on the left side.  Pulmonary/Chest: Breath sounds normal. No respiratory distress. He has no wheezes.  Abdominal: Bowel sounds are normal. He exhibits no distension. There is no tenderness. There is no guarding and no CVA tenderness.     Musculoskeletal: He exhibits no edema.       Right shoulder: He exhibits no swelling and no deformity.  Neurological: He is alert and oriented to person, place, and time. He has normal strength and normal reflexes. No cranial nerve deficit or sensory deficit. Gait normal.  Skin: Skin is warm and dry. No rash noted.  No cyanosis. Nails show no clubbing.  Psychiatric: He has a normal mood and affect. His speech is normal and behavior is normal. Judgment and thought content normal. Cognition and memory are normal.   Recent Results (from the past 2160 hour(s))  Comprehensive metabolic panel     Status: Abnormal   Collection Time: 09/22/16  7:49 AM  Result Value Ref Range   Sodium 142 135 - 146 mmol/L   Potassium 4.6 3.5 - 5.3 mmol/L   Chloride 107 98 - 110 mmol/L   CO2 22 20 - 31 mmol/L   Glucose, Bld 134 (H) 65 - 99 mg/dL   BUN 16 7 - 25 mg/dL   Creat 1.04 0.70 - 1.25 mg/dL    Comment:   For patients > or = 67 years of age: The upper reference limit for Creatinine is approximately 13% higher for people identified as African-American.      Total Bilirubin 0.9 0.2 - 1.2 mg/dL   Alkaline Phosphatase 76 40 - 115 U/L   AST 26 10 - 35 U/L   ALT 45 9 - 46 U/L   Total Protein 7.6 6.1 - 8.1 g/dL    Albumin 4.0 3.6 - 5.1 g/dL   Calcium 9.3 8.6 - 10.3 mg/dL  Hemoglobin A1c     Status: Abnormal   Collection Time: 09/22/16  7:49 AM  Result Value Ref Range   Hgb A1c MFr Bld 9.0 (H) <5.7 %    Comment:   For someone without known diabetes, a hemoglobin A1c value of 6.5% or greater indicates that they may have diabetes and this should be confirmed with a follow-up test.   For someone with known diabetes, a value <7% indicates that their diabetes is well controlled and a value greater than or equal to 7% indicates suboptimal control. A1c targets should be individualized based on duration of diabetes, age, comorbid conditions, and other considerations.   Currently, no consensus exists for use of hemoglobin A1c for diagnosis of diabetes for children.      Mean Plasma Glucose 212 mg/dL     Assessment & Plan:   1. Type 2 diabetes mellitus with vascular disease (HCC)   -His  diabetes is  complicated by CVA and patient remains at a high risk for more acute and chronic complications of diabetes which include CAD, CVA, CKD, retinopathy, and neuropathy. These are all discussed in detail with the patient.  Patient came with target  glucose profile averaging 187-194, and  recent A1c of 9% increasing from 8% ,  Generally improving from 9.9% .  Glucose logs and insulin administration records pertaining to this visit,  to be scanned into patient's records.  Recent labs reviewed.   - I have re-counseled the patient on diet management and weight loss  by adopting a carbohydrate restricted / protein rich  Diet.  - Suggestion is made for patient to avoid simple carbohydrates   from his diet including Cakes , Desserts, Ice Cream,  Soda (  diet and regular) , Sweet Tea , Candies,  Chips, Cookies, Artificial Sweeteners,   and "Sugar-free" Products .  This will help patient to have stable blood glucose profile and potentially avoid unintended  Weight gain.  - Patient is advised to stick to a routine  mealtimes to eat 3 meals  a day and avoid unnecessary snacks ( to snack only to correct hypoglycemia).  - The patient  declined referral to CDE for individualized DM education.  - I have approached patient with  the following individualized plan to manage diabetes and patient agrees.  - Emphasizing the need for strict compliance, I advised him to continue Levemir 80 units daily at bedtime,  increase Apidra  To 30 units 3 times a day before meals, associated with monitoring of blood glucose before meals and at bedtime. Continue Farxiga 10 mg by mouth every morning. Continue metformin 1000 mg by mouth twice a day. -I have advised him on how he can rotate insulin injection sites on his abdominal skin.  Patient is advised to call clinic if he registers blood glucose less than 70 mg/dL or greater than 300 mg/dL.  - He reports that he did not qualify for bariatric surgery for Medicare coverage due to the fact that he is BMI is 32. - Patient specific target  for A1c; LDL, HDL, Triglycerides, and  Waist Circumference were discussed in detail.  2) BP/HTN:controlled. Continue current medications including ACEI/ARB. 3) Lipids/HPL:  continue statins. 4)  Weight/Diet:  He declined CDE consult , exercise, and carbohydrates information provided.  5) Chronic Care/Health Maintenance:  -Patient  Is  on ACEI/ARB and Statin medications and encouraged to continue to follow up with Ophthalmology, Podiatrist at least yearly or according to recommendations, and advised to  stay away from smoking. I have recommended yearly flu vaccine and pneumonia vaccination at least every 5 years; moderate intensity exercise for up to 150 minutes weekly; and  sleep for at least 7 hours a day.  - 25 minutes of time was spent on the care of this patient , 50% of which was applied for counseling on diabetes complications and their preventions.  - I advised patient to maintain close follow up with Burdine, Virgina Evener, MD for primary  care needs.  Patient is asked to bring meter and  blood glucose logs during his next visit.   Follow up plan: -Return in about 3 months (around 12/29/2016) for follow up with pre-visit labs, meter, and logs.  Glade Lloyd, MD Phone: [phone]  Fax: [phone]   09/28/2016, 9:46 AM

## 2016-10-12 ENCOUNTER — Other Ambulatory Visit: Payer: Self-pay | Admitting: "Endocrinology

## 2016-11-04 ENCOUNTER — Other Ambulatory Visit: Payer: Self-pay | Admitting: Cardiovascular Disease

## 2016-12-09 ENCOUNTER — Other Ambulatory Visit: Payer: Self-pay | Admitting: "Endocrinology

## 2016-12-13 ENCOUNTER — Other Ambulatory Visit: Payer: Self-pay | Admitting: "Endocrinology

## 2016-12-22 ENCOUNTER — Other Ambulatory Visit: Payer: Self-pay | Admitting: "Endocrinology

## 2016-12-25 ENCOUNTER — Other Ambulatory Visit: Payer: Self-pay | Admitting: "Endocrinology

## 2016-12-28 ENCOUNTER — Other Ambulatory Visit: Payer: Self-pay | Admitting: "Endocrinology

## 2016-12-29 ENCOUNTER — Ambulatory Visit: Payer: Medicare Other | Admitting: "Endocrinology

## 2016-12-31 DIAGNOSIS — Z23 Encounter for immunization: Secondary | ICD-10-CM | POA: Diagnosis not present

## 2017-01-05 DIAGNOSIS — E1159 Type 2 diabetes mellitus with other circulatory complications: Secondary | ICD-10-CM | POA: Diagnosis not present

## 2017-01-06 LAB — RENAL FUNCTION PANEL
Albumin: 4.1 g/dL (ref 3.6–5.1)
BUN: 17 mg/dL (ref 7–25)
CHLORIDE: 104 mmol/L (ref 98–110)
CO2: 27 mmol/L (ref 20–32)
CREATININE: 1.04 mg/dL (ref 0.70–1.25)
Calcium: 9.2 mg/dL (ref 8.6–10.3)
GLUCOSE: 187 mg/dL — AB (ref 65–99)
POTASSIUM: 4.1 mmol/L (ref 3.5–5.3)
Phosphorus: 4 mg/dL (ref 2.1–4.3)
Sodium: 139 mmol/L (ref 135–146)

## 2017-01-06 LAB — HEMOGLOBIN A1C
HEMOGLOBIN A1C: 9 %{Hb} — AB (ref <5.7)
MEAN PLASMA GLUCOSE: 212 (calc)
eAG (mmol/L): 11.7 (calc)

## 2017-01-12 ENCOUNTER — Ambulatory Visit (INDEPENDENT_AMBULATORY_CARE_PROVIDER_SITE_OTHER): Payer: Medicare Other | Admitting: "Endocrinology

## 2017-01-12 ENCOUNTER — Encounter: Payer: Self-pay | Admitting: "Endocrinology

## 2017-01-12 VITALS — BP 124/65 | HR 73 | Ht 75.0 in | Wt 263.0 lb

## 2017-01-12 DIAGNOSIS — Z6832 Body mass index (BMI) 32.0-32.9, adult: Secondary | ICD-10-CM

## 2017-01-12 DIAGNOSIS — E1159 Type 2 diabetes mellitus with other circulatory complications: Secondary | ICD-10-CM | POA: Diagnosis not present

## 2017-01-12 DIAGNOSIS — I10 Essential (primary) hypertension: Secondary | ICD-10-CM

## 2017-01-12 DIAGNOSIS — E6609 Other obesity due to excess calories: Secondary | ICD-10-CM | POA: Diagnosis not present

## 2017-01-12 DIAGNOSIS — I25708 Atherosclerosis of coronary artery bypass graft(s), unspecified, with other forms of angina pectoris: Secondary | ICD-10-CM

## 2017-01-12 DIAGNOSIS — E782 Mixed hyperlipidemia: Secondary | ICD-10-CM | POA: Diagnosis not present

## 2017-01-12 MED ORDER — FREESTYLE LIBRE SENSOR SYSTEM MISC: 2 refills | Status: DC

## 2017-01-12 MED ORDER — FREESTYLE LIBRE READER DEVI: 1.0000 | Freq: Once | 0 refills | Status: AC

## 2017-01-12 NOTE — Progress Notes (Signed)
Subjective:    Patient ID: Alex Cortez, male    DOB: 1950-09-05, PCP Burdine, Virgina Evener, MD   Past Medical History:  Diagnosis Date  . CAD (coronary artery disease)    nonobstructive CAD/patent LAD stent site; EF 65%, 08/2011; NSTEMI/DES LAD, 6/20 11  . Chest pain   . Diabetes mellitus    insulin-dependent  . Dyslipidemia   . HTN (hypertension)   . Post MI syndrome [facility]) 08-29-2009   Past Surgical History:  Procedure Laterality Date  . CORONARY ANGIOPLASTY WITH STENT PLACEMENT     Social History   Social History  . Marital status: Married    Spouse name: N/A  . Number of children: N/A  . Years of education: N/A   Social History Main Topics  . Smoking status: Former Smoker    Packs/day: 0.50    Years: 23.00    Types: Cigarettes    Start date: 03/16/1955    Quit date: 03/15/1988  . Smokeless tobacco: Never Used  . Alcohol use No  . Drug use: No  . Sexual activity: Not Asked   Other Topics Concern  . None   Social History Narrative   Works at [employer]. Married.    Outpatient Encounter Prescriptions as of 01/12/2017  Medication Sig  . amLODipine (NORVASC) 10 MG tablet Take 10 mg by mouth daily.    . APIDRA SOLOSTAR 100 UNIT/ML Solostar Pen INJECT 30-36 UNITS         SUBCUTANEOUSLY 3 TIMES A   DAY WITH MEALS  . aspirin 325 MG tablet Take 325 mg by mouth daily.  Marland Kitchen atorvastatin (LIPITOR) 80 MG tablet Take 1 tablet (80 mg total) by mouth every evening.  . benazepril (LOTENSIN) 40 MG tablet Take 40 mg by mouth 2 (two) times daily.   . carvedilol (COREG) 25 MG tablet TAKE 1 TABLET (25 MG TOTAL) BY MOUTH 2 (TWO) TIMES DAILY.  Marland Kitchen Continuous Blood Gluc Receiver (FREESTYLE LIBRE READER) DEVI 1 Piece by Does not apply route once.  . Continuous Blood Gluc Sensor (FREESTYLE LIBRE SENSOR SYSTEM) MISC Use one sensor every 10 days.  Marland Kitchen docusate sodium (COLACE) 100 MG capsule Take 100 mg by mouth daily.  . Insulin Glulisine (APIDRA SOLOSTAR IJ) Inject 30-36 Units as directed 3  (three) times daily with meals.  . isosorbide mononitrate (IMDUR) 30 MG 24 hr tablet TAKE 1 TABLET BY MOUTH DAILY  . LEVEMIR FLEXTOUCH 100 UNIT/ML Pen INJECT 80 UNITS            SUBCUTANEOUSLY DAILY AT    10PM  . metFORMIN (GLUCOPHAGE) 1000 MG tablet TAKE 1 TABLET TWICE A DAY FOR DIABETES  . nitroGLYCERIN (NITROSTAT) 0.4 MG SL tablet Place 1 tablet (0.4 mg total) under the tongue every 5 (five) minutes as needed.  . ONE TOUCH ULTRA TEST test strip USE TO TEST BLOOD SUGAR FOUR TIMES DAILY E11.65  . [DISCONTINUED] FARXIGA 10 MG TABS tablet TAKE 1 TABLET BY MOUTH EVERY MORNING  . [DISCONTINUED] ONE TOUCH ULTRA TEST test strip USE TO TEST BLOOD SUGAR FOUR TIMES DAILY E11.65  . [DISCONTINUED] ONE TOUCH ULTRA TEST test strip USE TO TEST BLOOD SUGAR FOUR TIMES DAILY E11.65  . [DISCONTINUED] ONE TOUCH ULTRA TEST test strip USE TO TEST BLOOD SUGAR FOUR TIMES DAILY E11.65  . [DISCONTINUED] ONE TOUCH ULTRA TEST test strip USE TO TEST BLOOD SUGAR FOUR TIMES DAILY E11.65   No facility-administered encounter medications on file as of 01/12/2017.    ALLERGIES: Allergies  Allergen Reactions  . Shrimp [Shellfish Allergy]     Swelling and hives    VACCINATION STATUS:  There is no immunization history on file for this patient.  Diabetes  He presents for his follow-up diabetic visit. He has type 2 diabetes mellitus. Onset time: He was diagnosed at approximate age of 41 years. His disease course has been stable. There are no hypoglycemic associated symptoms. Pertinent negatives for hypoglycemia include no confusion, headaches, pallor or seizures. Pertinent negatives for diabetes include no chest pain, no fatigue, no polydipsia, no polyphagia, no polyuria and no weakness. There are no hypoglycemic complications. Symptoms are stable. Diabetic complications include a CVA. Risk factors for coronary artery disease include diabetes mellitus, dyslipidemia, hypertension, male sex, obesity and sedentary lifestyle.  Current diabetic treatment includes intensive insulin program. He is compliant with treatment most of the time. His weight is stable. He is following a generally unhealthy diet. When asked about meal planning, he reported none. He has not had a previous visit with a dietitian (He declined referral to a CDE for diabetes education.). His breakfast blood glucose range is generally 140-180 mg/dl. His lunch blood glucose range is generally 140-180 mg/dl. His dinner blood glucose range is generally 140-180 mg/dl. His overall blood glucose range is 140-180 mg/dl. An ACE inhibitor/angiotensin II receptor blocker is being taken. Eye exam is current.  Hyperlipidemia  This is a chronic problem. The current episode started more than 1 year ago. Exacerbating diseases include diabetes and obesity. Pertinent negatives include no chest pain, leg pain, myalgias or shortness of breath. Current antihyperlipidemic treatment includes statins. Risk factors for coronary artery disease include diabetes mellitus, dyslipidemia, hypertension, male sex, obesity and a sedentary lifestyle.  Hypertension  This is a chronic problem. The current episode started more than 1 year ago. The problem is controlled. Pertinent negatives include no chest pain, headaches, neck pain, palpitations or shortness of breath. Past treatments include ACE inhibitors. Hypertensive end-organ damage includes CVA.    Review of Systems  Constitutional: Negative for fatigue and unexpected weight change.  HENT: Negative for dental problem, mouth sores and trouble swallowing.   Eyes: Negative for visual disturbance.  Respiratory: Negative for cough, choking, chest tightness, shortness of breath and wheezing.   Cardiovascular: Negative for chest pain, palpitations and leg swelling.  Gastrointestinal: Negative for abdominal distention, abdominal pain, constipation, diarrhea, nausea and vomiting.  Endocrine: Negative for polydipsia, polyphagia and polyuria.   Genitourinary: Negative for dysuria, flank pain, hematuria and urgency.  Musculoskeletal: Negative for back pain, gait problem, myalgias and neck pain.  Skin: Negative for pallor, rash and wound.  Neurological: Negative for seizures, syncope, weakness, numbness and headaches.  Psychiatric/Behavioral: Negative.  Negative for confusion and dysphoric mood.    Objective:    BP 124/65   Pulse 73   Ht 6\' 3"  (1.905 m)   Wt 263 lb (119.3 kg)   BMI 32.87 kg/m   Wt Readings from Last 3 Encounters:  01/12/17 263 lb (119.3 kg)  09/28/16 259 lb (117.5 kg)  06/15/16 260 lb (117.9 kg)    Physical Exam  Constitutional: He is oriented to person, place, and time. He appears well-developed and well-nourished. He is cooperative. No distress.  HENT:  Head: Normocephalic and atraumatic.  Eyes: EOM are normal.  Neck: Normal range of motion. Neck supple. No tracheal deviation present. No thyromegaly present.  Cardiovascular: Normal rate, S1 normal, S2 normal and normal heart sounds.  Exam reveals no gallop.   No murmur heard. Pulses:  Dorsalis pedis pulses are 1+ on the right side, and 1+ on the left side.       Posterior tibial pulses are 1+ on the right side, and 1+ on the left side.  Pulmonary/Chest: Breath sounds normal. No respiratory distress. He has no wheezes.  Abdominal: Bowel sounds are normal. He exhibits no distension. There is no tenderness. There is no guarding and no CVA tenderness.     Musculoskeletal: He exhibits no edema.       Right shoulder: He exhibits no swelling and no deformity.  Neurological: He is alert and oriented to person, place, and time. He has normal strength and normal reflexes. No cranial nerve deficit or sensory deficit. Gait normal.  Skin: Skin is warm and dry. No rash noted. No cyanosis. Nails show no clubbing.  Psychiatric: He has a normal mood and affect. His speech is normal and behavior is normal. Judgment and thought content normal. Cognition and memory  are normal.   Recent Results (from the past 2160 hour(s))  Renal function panel     Status: Abnormal   Collection Time: 01/05/17  8:06 AM  Result Value Ref Range   Glucose, Bld 187 (H) 65 - 99 mg/dL    Comment: .            Fasting reference interval . For someone without known diabetes, a glucose value >125 mg/dL indicates that they may have diabetes and this should be confirmed with a follow-up test. .    BUN 17 7 - 25 mg/dL   Creat 1.04 0.70 - 1.25 mg/dL    Comment: For patients >55 years of age, the reference limit for Creatinine is approximately 13% higher for people identified as African-American. .    BUN/Creatinine Ratio NOT APPLICABLE 6 - 22 (calc)   Sodium 139 135 - 146 mmol/L   Potassium 4.1 3.5 - 5.3 mmol/L   Chloride 104 98 - 110 mmol/L   CO2 27 20 - 32 mmol/L   Calcium 9.2 8.6 - 10.3 mg/dL   Phosphorus 4.0 2.1 - 4.3 mg/dL   Albumin 4.1 3.6 - 5.1 g/dL  Hemoglobin A1c     Status: Abnormal   Collection Time: 01/05/17  8:06 AM  Result Value Ref Range   Hgb A1c MFr Bld 9.0 (H) <5.7 % of total Hgb    Comment: For someone without known diabetes, a hemoglobin A1c value of 6.5% or greater indicates that they may have  diabetes and this should be confirmed with a follow-up  test. . For someone with known diabetes, a value <7% indicates  that their diabetes is well controlled and a value  greater than or equal to 7% indicates suboptimal  control. A1c targets should be individualized based on  duration of diabetes, age, comorbid conditions, and  other considerations. . Currently, no consensus exists regarding use of hemoglobin A1c for diagnosis of diabetes for children. .    Mean Plasma Glucose 212 (calc)   eAG (mmol/L) 11.7 (calc)     Assessment & Plan:   1. Type 2 diabetes mellitus with vascular disease (HCC)  -His  diabetes is  complicated by CVA and patient remains at a high risk for more acute and chronic complications of diabetes which include CAD,  CVA, CKD, retinopathy, and neuropathy. These are all discussed in detail with the patient.  Patient came with  near target  glucose profile averaging 176- 183, and  recent A1c stays at 9%, generally improving from 9.9% . - He admits  continued dietary indiscretion.  Glucose logs and insulin administration records pertaining to this visit,  to be scanned into patient's records.  Recent labs reviewed.  - I have re-counseled the patient on diet management and weight loss  by adopting a carbohydrate restricted / protein rich  Diet. - He is now open to proceed with a dietitian.  -  Suggestion is made for him to avoid simple carbohydrates  from his diet including Cakes, Sweet Desserts / Pastries, Ice Cream, Soda (diet and regular), Sweet Tea, Candies, Chips, Cookies, Store Bought Juices, Alcohol in Excess of  1-2 drinks a day, Artificial Sweeteners, and "Sugar-free" Products. This will help patient to have stable blood glucose profile and potentially avoid unintended weight gain.  - Patient is advised to stick to a routine mealtimes to eat 3 meals  a day and avoid unnecessary snacks ( to snack only to correct hypoglycemia).  - I have approached patient with the following individualized plan to manage diabetes and patient agrees.  - Emphasizing the need for strict compliance, I advised him to continue Levemir 80 units daily at bedtime,  Apidra   30 units 3 times a day before meals, associated with monitoring of blood glucose before meals and at bedtime. - I will discontinue [place] . Continue Metformin 1000 mg by mouth twice a day. -I have advised him on how he can rotate insulin injection sites on his abdominal skin.  Patient is advised to call clinic if he registers blood glucose less than 70 mg/dL or greater than 300 mg/dL. - He'll benefit from continuous glucose monitoring. I discussed and initiated a prescription for Freestyle libre device for him.  - He reports that he did not qualify for  bariatric surgery for Medicare coverage due to the fact that he is BMI is 32. - Patient specific target  for A1c; LDL, HDL, Triglycerides, and  Waist Circumference were discussed in detail.  2) BP/HTN: Controlled. I advised him to continue current medications including ACEI/ARB. 3) Lipids/HPL:  continue statins. 4)  Weight/Diet:  He declined CDE consult in the past however now is open proceed with a dietitian. I will request consult again. exercise, and carbohydrates information provided.  5) Chronic Care/Health Maintenance:  -Patient  is  on ACEI/ARB and Statin medications and encouraged to continue to follow up with Ophthalmology, Podiatrist at least yearly or according to recommendations, and advised to  stay away from smoking. I have recommended yearly flu vaccine and pneumonia vaccination at least every 5 years; moderate intensity exercise for up to 150 minutes weekly; and  sleep for at least 7 hours a day.  - Time spent with the patient: 25 min, of which >50% was spent in reviewing his sugar logs , discussing his hypo- and hyper-glycemic episodes, reviewing his current and  previous labs and insulin doses and developing a plan to avoid hypo- and hyper-glycemia.   - I advised patient to maintain close follow up with Burdine, Virgina Evener, MD for primary care needs.   Follow up plan: -Return in about 3 months (around 04/14/2017) for meter, and logs, follow up with pre-visit labs, meter, and logs.  Glade Lloyd, MD Phone: [phone]  Fax: [phone]  -  This note was partially dictated with voice recognition software. Similar sounding words can be transcribed inadequately or may not  be corrected upon review.  01/12/2017, 2:41 PM

## 2017-01-12 NOTE — Patient Instructions (Signed)

## 2017-01-24 ENCOUNTER — Encounter: Payer: Medicare Other | Attending: "Endocrinology | Admitting: Nutrition

## 2017-01-24 VITALS — Ht 74.5 in | Wt 265.0 lb

## 2017-01-24 DIAGNOSIS — E118 Type 2 diabetes mellitus with unspecified complications: Secondary | ICD-10-CM

## 2017-01-24 DIAGNOSIS — E669 Obesity, unspecified: Secondary | ICD-10-CM

## 2017-01-24 DIAGNOSIS — IMO0002 Reserved for concepts with insufficient information to code with codable children: Secondary | ICD-10-CM

## 2017-01-24 DIAGNOSIS — E1159 Type 2 diabetes mellitus with other circulatory complications: Secondary | ICD-10-CM | POA: Diagnosis not present

## 2017-01-24 DIAGNOSIS — E1165 Type 2 diabetes mellitus with hyperglycemia: Secondary | ICD-10-CM

## 2017-01-24 DIAGNOSIS — Z713 Dietary counseling and surveillance: Secondary | ICD-10-CM | POA: Insufficient documentation

## 2017-01-24 NOTE — Progress Notes (Signed)
  Medical Nutrition Therapy:  Appt start time: 0800 end time:  0900.   Assessment:  Primary concerns today: Diabetes Type 2. Lives with his wife.  His wife does the shopping and cooking. Had DM 10-12 years. Testing blood sugars 4 times per day. PMH: MI, CVA.  FBS 177-252 mg/dl and before lunch 136-258.mg/dl  Dinner 133-248 mg/dl Has a cold currenlty and taking mucinex, sutafed and nasal spray. He noticed his BS are elevated due to meds.   Admits to not knowing what to eat and how much to eat to improve his blood sugars. He wants his wife to come next time. Motivated and engaged to learn. Current diet is extremely high in saturated fat, salt and processed foods. He is willing to make changes with his diet.   Preferred Learning Style:   No preference indicated   Learning Readiness:  Ready  Change in progress   MEDICATIONS:   DIETARY INTAKE:  24-hr recall:  B ( 8 AM): coffee with splenda and creamer, egg, spam, and 2 slices bread and tomatoes,   Snk ( AM): none L ( 1 PM): water, can of vienna sausages,  Snk ( PM):  D ( 5-6 PM): BIg mac, french fries and McRib, Coke  Snk ( PM): nuts,  Beverages: water, coffee, soda,   Usual physical activity: ADL  Estimated energy needs: 1800  calories 200 g carbohydrates 135 g protein 50 g fat  Progress Towards Goal(s):  In progress.   Nutritional Diagnosis:  NB-1.1 Food and nutrition-related knowledge deficit As related to Diabetes.  As evidenced by A1C > 9%.    Intervention:  Nutrition and Diabetes education provided on My Plate, CHO counting, meal planning, portion sizes, timing of meals, avoiding snacks between meals unless having a low blood sugar, target ranges for A1C and blood sugars, signs/symptoms and treatment of hyper/hypoglycemia, monitoring blood sugars, taking medications as prescribed, benefits of exercising 30 minutes per day and prevention of complications of DM.  Marland KitchenGoals 1. Follow My Plate 2. Cut out snacks between  meals unless veggies 3. Increase fresh fruits  4. Drink only water 5. Increase fiber in diet 6. Cut out all processed foods and fast foods. Exercise  15-30 minutes three times per week Get A1C down to 7%   Teaching Method Utilized: Visual Auditory Hands on  Handouts given during visit include:  The Plate Method  Meal Plan Card  Diabetes Instructions.   Barriers to learning/adherence to lifestyle change: none  Demonstrated degree of understanding via:  Teach Back   Monitoring/Evaluation:  Dietary intake, exercise, meal planning, SBG, and body weight in 1 month(s).

## 2017-01-24 NOTE — Patient Instructions (Signed)
Goals 1. Follow My Plate 2. Cut out snacks between meals unless veggies 3. Increase fresh fruits  4. Drink only water 5. Increase fiber in diet 6. Cut out all processed foods and fast foods. Exercise  15-30 minutes three times per week Get A1C down to 7%

## 2017-01-25 ENCOUNTER — Encounter: Payer: Self-pay | Admitting: Nutrition

## 2017-02-06 ENCOUNTER — Other Ambulatory Visit: Payer: Self-pay | Admitting: "Endocrinology

## 2017-02-16 ENCOUNTER — Other Ambulatory Visit: Payer: Self-pay | Admitting: "Endocrinology

## 2017-02-28 ENCOUNTER — Encounter: Payer: Self-pay | Admitting: "Endocrinology

## 2017-03-01 ENCOUNTER — Encounter: Payer: Medicare Other | Attending: Family Medicine | Admitting: Nutrition

## 2017-03-01 ENCOUNTER — Encounter: Payer: Self-pay | Admitting: "Endocrinology

## 2017-03-01 VITALS — Wt 264.0 lb

## 2017-03-01 DIAGNOSIS — IMO0002 Reserved for concepts with insufficient information to code with codable children: Secondary | ICD-10-CM

## 2017-03-01 DIAGNOSIS — E1165 Type 2 diabetes mellitus with hyperglycemia: Secondary | ICD-10-CM

## 2017-03-01 DIAGNOSIS — Z794 Long term (current) use of insulin: Secondary | ICD-10-CM | POA: Insufficient documentation

## 2017-03-01 DIAGNOSIS — E118 Type 2 diabetes mellitus with unspecified complications: Secondary | ICD-10-CM

## 2017-03-01 DIAGNOSIS — E1159 Type 2 diabetes mellitus with other circulatory complications: Secondary | ICD-10-CM | POA: Insufficient documentation

## 2017-03-01 NOTE — Patient Instructions (Addendum)
Goals 1. Cut out sodas 2.Eat more lower carb vegetables. 3. Make better foods choices when eating out 4. Ride the exercise bike 5. Drink water 6. Cut out snacks. Get A1C to 7%

## 2017-03-01 NOTE — Progress Notes (Signed)
  Medical Nutrition Therapy:  Appt start time: 945 end time:  1000  Assessment:  Primary concerns today: Diabetes Type 2. Lives with his wife.    80 units Levemir Apridea 30 units TID plus sliding scale.  Metformin 1000 mg BID.  Still drinking sodas and eating out and snacking between meals. Still needs better commitment in following meal plan for needed weight loss and better DM control . Only active one day a week when he bowls. Lab Results  Component Value Date   HGBA1C 9.0 (H) 01/05/2017   Physical actvity: bowls.  Preferred Learning Style:   No preference indicated   Learning Readiness:  Ready  Change in progress   MEDICATIONS:   DIETARY INTAKE:  24-hr recall:  B ( 8 AM): 1 egg, 1 slice toast, 1/2 banana and 1/2 glass milk and bacon or sausage sometimes. Snk ( AM): none L ( 1 PM): Hamburger with bun, french fries, 8 oz Pepsi Snk ( PM):  D ( 5-6 PM):1 1/c spaghetti, toss salad and water Snk ( PM): piece of cake 4", apple,  Beverages: water, coffee, soda,   Usual physical activity: ADL  Estimated energy needs: 1800  calories 200 g carbohydrates 135 g protein 50 g fat  Progress Towards Goal(s):  In progress.   Nutritional Diagnosis:  NB-1.1 Food and nutrition-related knowledge deficit As related to Diabetes.  As evidenced by A1C > 9%.    Intervention:  Nutrition and Diabetes education provided on My Plate, CHO counting, meal planning, portion sizes, timing of meals, avoiding snacks between meals unless having a low blood sugar, target ranges for A1C and blood sugars, signs/symptoms and treatment of hyper/hypoglycemia, monitoring blood sugars, taking medications as prescribed, benefits of exercising 30 minutes per day and prevention of complications of DM.    Goals 1. Cut out sodas 2.Eat more lower carb vegetables. 3. Make better foods choices when eating out 4. Ride the exercise bike 5. Drink water 6. Cut out snacks. Get A1C to 7%  Teaching Method  Utilized: Visual Auditory Hands on  Handouts given during visit include:  The Plate Method  Meal Plan Card  Diabetes Instructions.   Barriers to learning/adherence to lifestyle change: none  Demonstrated degree of understanding via:  Teach Back   Monitoring/Evaluation:  Dietary intake, exercise, meal planning, SBG, and body weight in 1 month(s).

## 2017-03-02 ENCOUNTER — Other Ambulatory Visit: Payer: Self-pay | Admitting: "Endocrinology

## 2017-03-10 ENCOUNTER — Encounter: Payer: Self-pay | Admitting: Nutrition

## 2017-03-18 DIAGNOSIS — E782 Mixed hyperlipidemia: Secondary | ICD-10-CM | POA: Diagnosis not present

## 2017-03-18 DIAGNOSIS — R5383 Other fatigue: Secondary | ICD-10-CM | POA: Diagnosis not present

## 2017-03-18 DIAGNOSIS — Z6835 Body mass index (BMI) 35.0-35.9, adult: Secondary | ICD-10-CM | POA: Diagnosis not present

## 2017-03-18 DIAGNOSIS — F5221 Male erectile disorder: Secondary | ICD-10-CM | POA: Diagnosis not present

## 2017-03-18 DIAGNOSIS — I10 Essential (primary) hypertension: Secondary | ICD-10-CM | POA: Diagnosis not present

## 2017-03-18 DIAGNOSIS — E1165 Type 2 diabetes mellitus with hyperglycemia: Secondary | ICD-10-CM | POA: Diagnosis not present

## 2017-03-18 DIAGNOSIS — Z0001 Encounter for general adult medical examination with abnormal findings: Secondary | ICD-10-CM | POA: Diagnosis not present

## 2017-03-18 LAB — LIPID PANEL
Cholesterol: 112 (ref 0–200)
HDL: 26 — AB (ref 35–70)
LDL Cholesterol: 72
Triglycerides: 68 (ref 40–160)

## 2017-03-18 LAB — TSH: TSH: 2.28 (ref <=5.90)

## 2017-03-18 LAB — BASIC METABOLIC PANEL
BUN: 10 (ref 4–21)
Creatinine: 0.9 (ref <=1.3)

## 2017-03-18 LAB — HEMOGLOBIN A1C: Hemoglobin A1C: 9.7

## 2017-03-23 DIAGNOSIS — I10 Essential (primary) hypertension: Secondary | ICD-10-CM | POA: Diagnosis not present

## 2017-03-23 DIAGNOSIS — E782 Mixed hyperlipidemia: Secondary | ICD-10-CM | POA: Diagnosis not present

## 2017-03-23 DIAGNOSIS — E1165 Type 2 diabetes mellitus with hyperglycemia: Secondary | ICD-10-CM | POA: Diagnosis not present

## 2017-03-23 DIAGNOSIS — Z955 Presence of coronary angioplasty implant and graft: Secondary | ICD-10-CM | POA: Diagnosis not present

## 2017-03-23 DIAGNOSIS — I252 Old myocardial infarction: Secondary | ICD-10-CM | POA: Diagnosis not present

## 2017-03-23 DIAGNOSIS — Z6835 Body mass index (BMI) 35.0-35.9, adult: Secondary | ICD-10-CM | POA: Diagnosis not present

## 2017-03-23 DIAGNOSIS — J209 Acute bronchitis, unspecified: Secondary | ICD-10-CM | POA: Diagnosis not present

## 2017-03-23 DIAGNOSIS — I6350 Cerebral infarction due to unspecified occlusion or stenosis of unspecified cerebral artery: Secondary | ICD-10-CM | POA: Diagnosis not present

## 2017-04-15 ENCOUNTER — Ambulatory Visit (INDEPENDENT_AMBULATORY_CARE_PROVIDER_SITE_OTHER): Payer: Medicare Other | Admitting: "Endocrinology

## 2017-04-15 ENCOUNTER — Encounter: Payer: Self-pay | Admitting: "Endocrinology

## 2017-04-15 VITALS — BP 122/71 | HR 83 | Ht 75.0 in | Wt 267.0 lb

## 2017-04-15 DIAGNOSIS — E6609 Other obesity due to excess calories: Secondary | ICD-10-CM

## 2017-04-15 DIAGNOSIS — Z6832 Body mass index (BMI) 32.0-32.9, adult: Secondary | ICD-10-CM

## 2017-04-15 DIAGNOSIS — I10 Essential (primary) hypertension: Secondary | ICD-10-CM | POA: Diagnosis not present

## 2017-04-15 DIAGNOSIS — E782 Mixed hyperlipidemia: Secondary | ICD-10-CM | POA: Diagnosis not present

## 2017-04-15 DIAGNOSIS — E1159 Type 2 diabetes mellitus with other circulatory complications: Secondary | ICD-10-CM

## 2017-04-15 NOTE — Patient Instructions (Signed)

## 2017-04-15 NOTE — Progress Notes (Signed)
Subjective:    Patient ID: Alex Cortez, male    DOB: January 23, 1951, PCP Burdine, Virgina Evener, MD   Past Medical History:  Diagnosis Date  . CAD (coronary artery disease)    nonobstructive CAD/patent LAD stent site; EF 65%, 08/2011; NSTEMI/DES LAD, 6/20 11  . Chest pain   . Diabetes mellitus    insulin-dependent  . Dyslipidemia   . HTN (hypertension)   . Post MI syndrome [facility]) 08-29-2009   Past Surgical History:  Procedure Laterality Date  . CORONARY ANGIOPLASTY WITH STENT PLACEMENT     Social History   Socioeconomic History  . Marital status: Married    Spouse name: None  . Number of children: None  . Years of education: None  . Highest education level: None  Social Needs  . Financial resource strain: None  . Food insecurity - worry: None  . Food insecurity - inability: None  . Transportation needs - medical: None  . Transportation needs - non-medical: None  Occupational History  . None  Tobacco Use  . Smoking status: Former Smoker    Packs/day: 0.50    Years: 23.00    Pack years: 11.50    Types: Cigarettes    Start date: 03/16/1955    Last attempt to quit: 03/15/1988    Years since quitting: 29.1  . Smokeless tobacco: Never Used  Substance and Sexual Activity  . Alcohol use: No    Alcohol/week: 0.0 oz  . Drug use: No  . Sexual activity: None  Other Topics Concern  . None  Social History Narrative   Works at [employer]. Married.    Outpatient Encounter Medications as of 04/15/2017  Medication Sig  . amLODipine (NORVASC) 10 MG tablet Take 10 mg by mouth daily.    . APIDRA SOLOSTAR 100 UNIT/ML Solostar Pen INJECT 30-36 UNITS         SUBCUTANEOUSLY 3 TIMES A   DAY WITH MEALS  . aspirin 325 MG tablet Take 325 mg by mouth daily.  Marland Kitchen atorvastatin (LIPITOR) 80 MG tablet Take 1 tablet (80 mg total) by mouth every evening.  . benazepril (LOTENSIN) 40 MG tablet Take 40 mg by mouth 2 (two) times daily.   . carvedilol (COREG) 25 MG tablet TAKE 1 TABLET (25 MG TOTAL) BY  MOUTH 2 (TWO) TIMES DAILY.  Marland Kitchen Continuous Blood Gluc Sensor (FREESTYLE LIBRE SENSOR SYSTEM) MISC Use one sensor every 10 days.  Marland Kitchen docusate sodium (COLACE) 100 MG capsule Take 100 mg by mouth daily.  . Insulin Glulisine (APIDRA SOLOSTAR IJ) Inject 30-36 Units as directed 3 (three) times daily with meals.  . isosorbide mononitrate (IMDUR) 30 MG 24 hr tablet TAKE 1 TABLET BY MOUTH DAILY  . LEVEMIR FLEXTOUCH 100 UNIT/ML Pen INJECT 80 UNITS            SUBCUTANEOUSLY DAILY AT    10PM  . metFORMIN (GLUCOPHAGE) 1000 MG tablet TAKE 1 TABLET TWICE A DAY FOR DIABETES  . nitroGLYCERIN (NITROSTAT) 0.4 MG SL tablet Place 1 tablet (0.4 mg total) under the tongue every 5 (five) minutes as needed.  . ONE TOUCH ULTRA TEST test strip USE TO TEST BLOOD SUGAR FOUR TIMES DAILY E11.65   No facility-administered encounter medications on file as of 04/15/2017.    ALLERGIES: Allergies  Allergen Reactions  . Shrimp [Shellfish Allergy]     Swelling and hives    VACCINATION STATUS:  There is no immunization history on file for this patient.  Diabetes  He presents  for his follow-up diabetic visit. He has type 2 diabetes mellitus. Onset time: He was diagnosed at approximate age of 89 years. His disease course has been improving. There are no hypoglycemic associated symptoms. Pertinent negatives for hypoglycemia include no confusion, headaches, pallor or seizures. Pertinent negatives for diabetes include no chest pain, no fatigue, no polydipsia, no polyphagia, no polyuria and no weakness. There are no hypoglycemic complications. Symptoms are improving. Diabetic complications include a CVA. Risk factors for coronary artery disease include diabetes mellitus, dyslipidemia, hypertension, male sex, obesity and sedentary lifestyle. Current diabetic treatment includes intensive insulin program. He is compliant with treatment most of the time. His weight is fluctuating minimally. He is following a generally unhealthy diet. When asked  about meal planning, he reported none. He has not had a previous visit with a dietitian (He declined referral to a CDE for diabetes education.). His breakfast blood glucose range is generally 140-180 mg/dl. His lunch blood glucose range is generally 140-180 mg/dl. His dinner blood glucose range is generally 140-180 mg/dl. His overall blood glucose range is 140-180 mg/dl. An ACE inhibitor/angiotensin II receptor blocker is being taken. Eye exam is current.  Hyperlipidemia  This is a chronic problem. The current episode started more than 1 year ago. The problem is controlled. Exacerbating diseases include diabetes and obesity. Pertinent negatives include no chest pain, leg pain, myalgias or shortness of breath. Current antihyperlipidemic treatment includes statins. Risk factors for coronary artery disease include diabetes mellitus, dyslipidemia, hypertension, male sex, obesity and a sedentary lifestyle.  Hypertension  This is a chronic problem. The current episode started more than 1 year ago. The problem is controlled. Pertinent negatives include no chest pain, headaches, neck pain, palpitations or shortness of breath. Risk factors for coronary artery disease include obesity, male gender, dyslipidemia, diabetes mellitus, sedentary lifestyle and family history. Past treatments include ACE inhibitors. Hypertensive end-organ damage includes CVA.    Review of Systems  Constitutional: Negative for fatigue and unexpected weight change.  HENT: Negative for dental problem, mouth sores and trouble swallowing.   Eyes: Negative for visual disturbance.  Respiratory: Negative for cough, choking, chest tightness, shortness of breath and wheezing.   Cardiovascular: Negative for chest pain, palpitations and leg swelling.  Gastrointestinal: Negative for abdominal distention, abdominal pain, constipation, diarrhea, nausea and vomiting.  Endocrine: Negative for polydipsia, polyphagia and polyuria.  Genitourinary:  Negative for dysuria, flank pain, hematuria and urgency.  Musculoskeletal: Negative for back pain, gait problem, myalgias and neck pain.  Skin: Negative for pallor, rash and wound.  Neurological: Negative for seizures, syncope, weakness, numbness and headaches.  Psychiatric/Behavioral: Negative.  Negative for confusion and dysphoric mood.    Objective:    BP 122/71   Pulse 83   Ht 6\' 3"  (1.905 m)   Wt 267 lb (121.1 kg)   BMI 33.37 kg/m   Wt Readings from Last 3 Encounters:  04/15/17 267 lb (121.1 kg)  03/01/17 264 lb (119.7 kg)  01/24/17 265 lb (120.2 kg)    Physical Exam  Constitutional: He is oriented to person, place, and time. He appears well-developed and well-nourished. He is cooperative. No distress.  HENT:  Head: Normocephalic and atraumatic.  Eyes: EOM are normal.  Neck: Normal range of motion. Neck supple. No tracheal deviation present. No thyromegaly present.  Cardiovascular: Normal rate, S1 normal, S2 normal and normal heart sounds. Exam reveals no gallop.  No murmur heard. Pulses:      Dorsalis pedis pulses are 1+ on the right side, and 1+  on the left side.       Posterior tibial pulses are 1+ on the right side, and 1+ on the left side.  Pulmonary/Chest: Breath sounds normal. No respiratory distress. He has no wheezes.  Abdominal: Bowel sounds are normal. He exhibits no distension. There is no tenderness. There is no guarding and no CVA tenderness.     Musculoskeletal: He exhibits no edema.       Right shoulder: He exhibits no swelling and no deformity.  Neurological: He is alert and oriented to person, place, and time. He has normal strength and normal reflexes. No cranial nerve deficit or sensory deficit. Gait normal.  Skin: Skin is warm and dry. No rash noted. No cyanosis. Nails show no clubbing.  Psychiatric: He has a normal mood and affect. His speech is normal and behavior is normal. Judgment and thought content normal. Cognition and memory are normal.    Recent Results (from the past 2160 hour(s))  Basic metabolic panel     Status: None   Collection Time: 03/18/17 12:00 AM  Result Value Ref Range   BUN 10 4 - 21   Creatinine 0.9 0.6 - 1.3  Lipid panel     Status: Abnormal   Collection Time: 03/18/17 12:00 AM  Result Value Ref Range   Triglycerides 68 40 - 160   Cholesterol 112 0 - 200   HDL 26 (A) 35 - 70   LDL Cholesterol 72   Hemoglobin A1c     Status: None   Collection Time: 03/18/17 12:00 AM  Result Value Ref Range   Hemoglobin A1C 9.7   TSH     Status: None   Collection Time: 03/18/17 12:00 AM  Result Value Ref Range   TSH 2.28 0.41 - 5.90     Assessment & Plan:   1. Type 2 diabetes mellitus with vascular disease (HCC)  -His  diabetes is  complicated by CVA and patient remains at a high risk for more acute and chronic complications of diabetes which include CAD, CVA, CKD, retinopathy, and neuropathy. These are all discussed in detail with the patient.  Patient came with  near target  glucose profile averaging 143.  He wears continuous glucose monitoring device showing 77% time in the range, 22% above target, 1% hypoglycemia rate.  -His A1c is in sharp discrepancy at 9.7%. - He admits continued dietary indiscretion.  Glucose logs and insulin administration records pertaining to this visit,  to be scanned into patient's records.  Recent labs reviewed.  - I have re-counseled the patient on diet management and weight loss  by adopting a carbohydrate restricted / protein rich  Diet. - He is now open to proceed with a dietitian.  -  Suggestion is made for him to avoid simple carbohydrates  from his diet including Cakes, Sweet Desserts / Pastries, Ice Cream, Soda (diet and regular), Sweet Tea, Candies, Chips, Cookies, Store Bought Juices, Alcohol in Excess of  1-2 drinks a day, Artificial Sweeteners, and "Sugar-free" Products. This will help patient to have stable blood glucose profile and potentially avoid unintended weight  gain.   - Patient is advised to stick to a routine mealtimes to eat 3 meals  a day and avoid unnecessary snacks ( to snack only to correct hypoglycemia).  - I have approached patient with the following individualized plan to manage diabetes and patient agrees.  -Based on his CGM analysis, his current glycemic profile is acceptable.  -  Emphasizing the need for strict compliance, I  advised him to continue Levemir 80 units daily at bedtime,  Apidra   30 units 3 times a day before meals, associated with documenting of blood glucose before meals and at bedtime. - I will discontinue [place] . Continue Metformin 1000 mg by mouth twice a day. 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  Patient is advised to call clinic if he registers blood glucose less than 70 mg/dL or greater than 300 mg/dL.  - He reports that he did not qualify for bariatric surgery for Medicare coverage due to the fact that he is BMI is 32. - Patient specific target  for A1c; LDL, HDL, Triglycerides, and  Waist Circumference were discussed in detail.  2) BP/HTN:  His blood pressure is controlled to target . I advised him to continue current medications including ACEI/ARB. 3) Lipids/HPL: His lipid panel is controlled with LDL of 72.  He is advised to continue statins. 4)  Weight/Diet:  He has consulted with CDE,   And in progress.   5) Chronic Care/Health Maintenance:  -Patient  is  on ACEI/ARB and Statin medications and encouraged to continue to follow up with Ophthalmology, Podiatrist at least yearly or according to recommendations, and advised to  stay away from smoking. I have recommended yearly flu vaccine and pneumonia vaccination at least every 5 years; moderate intensity exercise for up to 150 minutes weekly; and  sleep for at least 7 hours a day.  - Time spent with the patient: 25 min, of which >50% was spent in  reviewing his blood glucose logs , discussing his hypo- and hyper-glycemic episodes, reviewing his current and  previous labs and insulin doses and developing a plan to avoid hypo- and hyper-glycemia. Please refer to Patient Instructions for Blood Glucose Monitoring and Insulin/Medications Dosing Guide"  in media tab for additional information.   - I advised patient to maintain close follow up with Burdine, Virgina Evener, MD for primary care needs.   Follow up plan: -Return in about 3 months (around 07/13/2017) for follow up with pre-visit labs, meter, and logs.  Glade Lloyd, MD Phone: [phone]  Fax: [phone]  -  This note was partially dictated with voice recognition software. Similar sounding words can be transcribed inadequately or may not  be corrected upon review.  04/15/2017, 12:31 PM

## 2017-04-27 ENCOUNTER — Encounter: Payer: Self-pay | Admitting: Nutrition

## 2017-04-27 ENCOUNTER — Encounter: Payer: Medicare Other | Attending: Family Medicine | Admitting: Nutrition

## 2017-04-27 VITALS — Ht 75.0 in | Wt 265.0 lb

## 2017-04-27 DIAGNOSIS — E1159 Type 2 diabetes mellitus with other circulatory complications: Secondary | ICD-10-CM | POA: Diagnosis not present

## 2017-04-27 DIAGNOSIS — Z6832 Body mass index (BMI) 32.0-32.9, adult: Secondary | ICD-10-CM | POA: Insufficient documentation

## 2017-04-27 DIAGNOSIS — Z713 Dietary counseling and surveillance: Secondary | ICD-10-CM | POA: Diagnosis not present

## 2017-04-27 DIAGNOSIS — IMO0002 Reserved for concepts with insufficient information to code with codable children: Secondary | ICD-10-CM

## 2017-04-27 DIAGNOSIS — E118 Type 2 diabetes mellitus with unspecified complications: Secondary | ICD-10-CM

## 2017-04-27 DIAGNOSIS — E669 Obesity, unspecified: Secondary | ICD-10-CM

## 2017-04-27 DIAGNOSIS — E1165 Type 2 diabetes mellitus with hyperglycemia: Secondary | ICD-10-CM

## 2017-04-27 NOTE — Progress Notes (Signed)
Medical Nutrition Therapy:  Appt start time: 930 end time:  1000  Assessment:  Primary concerns today: Diabetes Type 2. Lives with his wife.  Has the [place] and likes it. ACG Bs are still high with 204 mg/dl. He notes he is still dealing with a lot of nasal congestion, SOB and doesn't feel like he has gotten any better. He reports this going on for 2 months. He had been on antibiotic and was on flonase and was taking musinex and his symptoms aren't any better per his report. He notes he gets SOB at night and feels like he is wheezing some. Advised to make appt with Dr. Pleas Koch to address his concerns for pneumonia or possible Congestive Heart Failure. He doesn't see his cardiologist for another few weeks-Dr. Lorrine Kin.     His diet recall reveals diet is high is salt, fat and processed foods. HIs wife is cooking with sea salt. He notes he SOB and can't exercise or do much.     His weight is the same. No swelling in his legs.  He notes he has cut out sodas and drinking more water.  Diet is still low in fresh fruits, whole grains and high fiber foods and too high in fat, salt and processed foods.    [employer] BS are highest at night and over night and mid afternoon. He is wanding his reader 4+ times per day.  A1C went up from 9% to 9.7%.  CMP Latest Ref Rng & Units 03/18/2017 01/05/2017 09/22/2016  Glucose 65 - 99 mg/dL - 187(H) 134(H)  BUN 4 - 21 10 17 16   Creatinine 0.6 - 1.3 0.9 1.04 1.04  Sodium 135 - 146 mmol/L - 139 142  Potassium 3.5 - 5.3 mmol/L - 4.1 4.6  Chloride 98 - 110 mmol/L - 104 107  CO2 20 - 32 mmol/L - 27 22  Calcium 8.6 - 10.3 mg/dL - 9.2 9.3  Total Protein 6.1 - 8.1 g/dL - - 7.6  Total Bilirubin 0.2 - 1.2 mg/dL - - 0.9  Alkaline Phos 40 - 115 U/L - - 76  AST 10 - 35 U/L - - 26  ALT 9 - 46 U/L - - 45    Lab Results  Component Value Date   HGBA1C 9.7 03/18/2017  80 units Levemir Apridea 30 units TID plus sliding scale.  Metformin 1000 mg BID.   physical actvity:   ADL and bowls a little.   Preferred Learning Style:   No preference indicated   Learning Readiness:  Ready  Change in progress   MEDICATIONS:   DIETARY INTAKE:  24-hr recall:  B ( 8 AM): Egg sandwich with bacon, cheese and white bread 2, tomotato and 1 cup milk 2% 1 cup Snk ( AM): none L ( 1 PM):Hot dog with bun, relish, mustard, water  Snk ( PM):  D ( 5-6 PM):  Slaw, polish sausage and pork n beans water, Snk ( PM):  Walnuts and   Beverages: water, coffee, soda,   Usual physical activity: ADL  Estimated energy needs: 1800  calories 200 g carbohydrates 135 g protein 50 g fat  Progress Towards Goal(s):  In progress.   Nutritional Diagnosis:  NB-1.1 Food and nutrition-related knowledge deficit As related to Diabetes.  As evidenced by A1C > 9%.    Intervention:  Nutrition and Diabetes education provided on My Plate, CHO counting, meal planning, portion sizes, timing of meals, avoiding snacks between meals unless having a low blood sugar, target ranges  for A1C and blood sugars, signs/symptoms and treatment of hyper/hypoglycemia, monitoring blood sugars, taking medications as prescribed, benefits of exercising 30 minutes per day and prevention of complications of DM.  Goals 1 2  eggs and 2 slices ww bread and fruit or yogurt  2. Eat [place] sandwich, and 1 cup salad and water and fruit 3. Cut out salt and processed high fat meats., hot dogs, bacon, sausage 4. Talk to Dr. Pleas Koch about breathing issues, sinus congestion and  SOB etc. 5. Take insulin as prescribed. Increase fresh fruits and vegetables.    Teaching Method Utilized: Visual Auditory Hands on  Handouts given during visit include:  The Plate Method  Meal Plan Card  Diabetes Instructions.   Barriers to learning/adherence to lifestyle change: none  Demonstrated degree of understanding via:  Teach Back   Monitoring/Evaluation:  Dietary intake, exercise, meal planning, SBG, and body weight in 3  month(s).

## 2017-04-27 NOTE — Patient Instructions (Addendum)
Goals 1 2  eggs and 2 slices ww bread and fruit or yogurt  2. Eat [place] sandwich, and 1 cup salad and water and fruit 3. Cut out salt and processed high fat meats., hot dogs, bacon, sausage 4. Talk to Dr. Pleas Koch about breathing issues, sinus congestion and  SOB etc. 5. Take insulin as prescribed. Increase fresh fruits and vegetables.

## 2017-05-23 ENCOUNTER — Other Ambulatory Visit: Payer: Self-pay

## 2017-05-27 ENCOUNTER — Other Ambulatory Visit: Payer: Self-pay | Admitting: "Endocrinology

## 2017-06-06 ENCOUNTER — Ambulatory Visit (INDEPENDENT_AMBULATORY_CARE_PROVIDER_SITE_OTHER): Payer: Medicare Other | Admitting: Cardiovascular Disease

## 2017-06-06 ENCOUNTER — Encounter: Payer: Self-pay | Admitting: Cardiovascular Disease

## 2017-06-06 VITALS — BP 128/73 | HR 77 | Ht 75.0 in | Wt 265.6 lb

## 2017-06-06 DIAGNOSIS — R0602 Shortness of breath: Secondary | ICD-10-CM

## 2017-06-06 DIAGNOSIS — E785 Hyperlipidemia, unspecified: Secondary | ICD-10-CM | POA: Diagnosis not present

## 2017-06-06 DIAGNOSIS — Z955 Presence of coronary angioplasty implant and graft: Secondary | ICD-10-CM | POA: Diagnosis not present

## 2017-06-06 DIAGNOSIS — I25118 Atherosclerotic heart disease of native coronary artery with other forms of angina pectoris: Secondary | ICD-10-CM

## 2017-06-06 DIAGNOSIS — Z8673 Personal history of transient ischemic attack (TIA), and cerebral infarction without residual deficits: Secondary | ICD-10-CM | POA: Diagnosis not present

## 2017-06-06 DIAGNOSIS — I10 Essential (primary) hypertension: Secondary | ICD-10-CM

## 2017-06-06 NOTE — Patient Instructions (Signed)

## 2017-06-06 NOTE — Progress Notes (Signed)
SUBJECTIVE: The patient presents for routine follow up. He has a history of coronary artery disease with a previously placed mid LAD drug-eluting stent in June 2011. His most recent coronary angiogram was in June 2013 which revealed a widely patent stent. He also has hypertension, hyperlipidemia, CVA in 10/2012, and insulin-dependent diabetes mellitus.   ECG performed in the office today which I ordered and personally interpreted demonstrates normal sinus rhythm with no ischemic ST segment or T-wave abnormalities, nor any arrhythmias.  He has chronic exertional dyspnea which is stable.  He denies chest pain and palpitations as well as orthopnea and paroxysmal nocturnal dyspnea.      Review of Systems: As per "subjective", otherwise negative.  Allergies  Allergen Reactions  . Shrimp [Shellfish Allergy]     Swelling and hives     Current Outpatient Medications  Medication Sig Dispense Refill  . amLODipine (NORVASC) 10 MG tablet Take 10 mg by mouth daily.      . APIDRA SOLOSTAR 100 UNIT/ML Solostar Pen INJECT 30-36 UNITS         SUBCUTANEOUSLY 3 TIMES A   DAY WITH MEALS 75 mL 0  . aspirin 325 MG tablet Take 325 mg by mouth daily.    Marland Kitchen atorvastatin (LIPITOR) 80 MG tablet Take 1 tablet (80 mg total) by mouth every evening. 90 tablet 3  . benazepril (LOTENSIN) 40 MG tablet Take 40 mg by mouth 2 (two) times daily.     . carvedilol (COREG) 25 MG tablet TAKE 1 TABLET (25 MG TOTAL) BY MOUTH 2 (TWO) TIMES DAILY. 180 tablet 2  . Continuous Blood Gluc Sensor (FREESTYLE LIBRE SENSOR SYSTEM) MISC Use one sensor every 10 days. 3 each 2  . Insulin Glulisine (APIDRA SOLOSTAR IJ) Inject 30-36 Units as directed 3 (three) times daily with meals.    . isosorbide mononitrate (IMDUR) 30 MG 24 hr tablet TAKE 1 TABLET BY MOUTH DAILY 30 tablet 4  . LEVEMIR FLEXTOUCH 100 UNIT/ML Pen INJECT 80 UNITS            SUBCUTANEOUSLY DAILY AT    10PM 75 mL 0  . metFORMIN (GLUCOPHAGE) 1000 MG tablet TAKE 1 TABLET  TWICE A DAY FOR DIABETES  3  . nitroGLYCERIN (NITROSTAT) 0.4 MG SL tablet Place 1 tablet (0.4 mg total) under the tongue every 5 (five) minutes as needed. 25 tablet 3  . ONE TOUCH ULTRA TEST test strip USE TO TEST BLOOD SUGAR FOUR TIMES DAILY E11.65 150 each 5   No current facility-administered medications for this visit.     Past Medical History:  Diagnosis Date  . CAD (coronary artery disease)    nonobstructive CAD/patent LAD stent site; EF 65%, 08/2011; NSTEMI/DES LAD, 6/20 11  . Chest pain   . Diabetes mellitus    insulin-dependent  . Dyslipidemia   . HTN (hypertension)   . Post MI syndrome [facility]) 08-29-2009    Past Surgical History:  Procedure Laterality Date  . CORONARY ANGIOPLASTY WITH STENT PLACEMENT      Social History   Socioeconomic History  . Marital status: Married    Spouse name: Not on file  . Number of children: Not on file  . Years of education: Not on file  . Highest education level: Not on file  Occupational History  . Not on file  Social Needs  . Financial resource strain: Not on file  . Food insecurity:    Worry: Not on file    Inability: Not on  file  . Transportation needs:    Medical: Not on file    Non-medical: Not on file  Tobacco Use  . Smoking status: Former Smoker    Packs/day: 0.50    Years: 23.00    Pack years: 11.50    Types: Cigarettes    Start date: 03/16/1955    Last attempt to quit: 03/15/1988    Years since quitting: 29.2  . Smokeless tobacco: Never Used  Substance and Sexual Activity  . Alcohol use: No    Alcohol/week: 0.0 oz  . Drug use: No  . Sexual activity: Not on file  Lifestyle  . Physical activity:    Days per week: Not on file    Minutes per session: Not on file  . Stress: Not on file  Relationships  . Social connections:    Talks on phone: Not on file    Gets together: Not on file    Attends religious service: Not on file    Active member of club or organization: Not on file    Attends meetings of clubs or  organizations: Not on file    Relationship status: Not on file  . Intimate partner violence:    Fear of current or ex partner: Not on file    Emotionally abused: Not on file    Physically abused: Not on file    Forced sexual activity: Not on file  Other Topics Concern  . Not on file  Social History Narrative   Works at [employer]. Married.      Vitals:   06/06/17 0957  BP: 128/73  Pulse: 77  SpO2: 95%  Weight: 265 lb 9.6 oz (120.5 kg)  Height: 6\' 3"  (1.905 m)    Wt Readings from Last 3 Encounters:  06/06/17 265 lb 9.6 oz (120.5 kg)  04/27/17 265 lb (120.2 kg)  04/15/17 267 lb (121.1 kg)     PHYSICAL EXAM General: NAD HEENT: Normal. Neck: No JVD, no thyromegaly. Lungs: Clear to auscultation bilaterally with normal respiratory effort. CV: Regular rate and rhythm, normal S1/S2, no S3/S4, no murmur. No pretibial or periankle edema.  No carotid bruit.   Abdomen: Soft, nontender, no distention.  Neurologic: Alert and oriented.  Psych: Normal affect. Skin: Normal. Musculoskeletal: No gross deformities.    ECG: Most recent ECG reviewed.   Labs: Lab Results  Component Value Date/Time   K 4.1 01/05/2017 08:06 AM   BUN 10 03/18/2017   CREATININE 0.9 03/18/2017   CREATININE 1.04 01/05/2017 08:06 AM   ALT 45 09/22/2016 07:49 AM   TSH 2.28 03/18/2017   HGB 16.3 09/22/2014 08:35 AM     Lipids: Lab Results  Component Value Date/Time   LDLCALC 72 03/18/2017   CHOL 112 03/18/2017   TRIG 68 03/18/2017   HDL 26 (A) 03/18/2017       ASSESSMENT AND PLAN:  1. CAD:  Symptomatically stable.  He has had no chest pain since undergoing PCI of the mid-LAD in June 2011. His most recent coronary angiogram in 08/2011 showed a widely patent stent.  Continue ASA (on 325 mg for h/o CVA), Coreg, Imdur, and Lipitor.  2. Hypertension: Controlled. No changes.  3. Hyperlipidemia: Continue statin.  Lipid panel on 03/18/17 showed total cholesterol 112, triglycerides 68, HDL low at  26, LDL 72.  4. IDDM: HbA1c elevated at 9.7% on 03/18/17.     Disposition: Follow up 1 year   Kate Sable, M.D., F.A.C.C.

## 2017-07-12 DIAGNOSIS — E1159 Type 2 diabetes mellitus with other circulatory complications: Secondary | ICD-10-CM | POA: Diagnosis not present

## 2017-07-12 LAB — COMPLETE METABOLIC PANEL WITH GFR
AG RATIO: 1.2 (calc) (ref 1.0–2.5)
ALBUMIN MSPROF: 4.1 g/dL (ref 3.6–5.1)
ALKALINE PHOSPHATASE (APISO): 73 U/L (ref 40–115)
ALT: 39 U/L (ref 9–46)
AST: 24 U/L (ref 10–35)
BUN / CREAT RATIO: 13 (calc) (ref 6–22)
BUN: 18 mg/dL (ref 7–25)
CO2: 28 mmol/L (ref 20–32)
CREATININE: 1.35 mg/dL — AB (ref 0.70–1.25)
Calcium: 9.1 mg/dL (ref 8.6–10.3)
Chloride: 106 mmol/L (ref 98–110)
GFR, EST NON AFRICAN AMERICAN: 54 mL/min/{1.73_m2} — AB (ref >=60)
GFR, Est African American: 63 mL/min/{1.73_m2} (ref >=60)
GLOBULIN: 3.5 g/dL (ref 1.9–3.7)
Glucose, Bld: 192 mg/dL — ABNORMAL HIGH (ref 65–139)
POTASSIUM: 4.4 mmol/L (ref 3.5–5.3)
SODIUM: 142 mmol/L (ref 135–146)
Total Bilirubin: 0.7 mg/dL (ref 0.2–1.2)
Total Protein: 7.6 g/dL (ref 6.1–8.1)

## 2017-07-13 LAB — HEMOGLOBIN A1C
EAG (MMOL/L): 13.2 (calc)
Hgb A1c MFr Bld: 9.9 % of total Hgb — ABNORMAL HIGH (ref <5.7)
MEAN PLASMA GLUCOSE: 237 (calc)

## 2017-07-14 ENCOUNTER — Encounter: Payer: Medicare Other | Attending: Family Medicine | Admitting: Nutrition

## 2017-07-14 ENCOUNTER — Ambulatory Visit (INDEPENDENT_AMBULATORY_CARE_PROVIDER_SITE_OTHER): Payer: Medicare Other | Admitting: "Endocrinology

## 2017-07-14 ENCOUNTER — Encounter: Payer: Self-pay | Admitting: "Endocrinology

## 2017-07-14 VITALS — BP 138/75 | HR 76 | Ht 75.0 in | Wt 265.0 lb

## 2017-07-14 DIAGNOSIS — E782 Mixed hyperlipidemia: Secondary | ICD-10-CM | POA: Diagnosis not present

## 2017-07-14 DIAGNOSIS — E1159 Type 2 diabetes mellitus with other circulatory complications: Secondary | ICD-10-CM

## 2017-07-14 DIAGNOSIS — I10 Essential (primary) hypertension: Secondary | ICD-10-CM | POA: Diagnosis not present

## 2017-07-14 DIAGNOSIS — E6609 Other obesity due to excess calories: Secondary | ICD-10-CM | POA: Diagnosis not present

## 2017-07-14 DIAGNOSIS — Z6832 Body mass index (BMI) 32.0-32.9, adult: Secondary | ICD-10-CM | POA: Diagnosis not present

## 2017-07-14 DIAGNOSIS — Z713 Dietary counseling and surveillance: Secondary | ICD-10-CM | POA: Diagnosis not present

## 2017-07-14 DIAGNOSIS — I25118 Atherosclerotic heart disease of native coronary artery with other forms of angina pectoris: Secondary | ICD-10-CM | POA: Diagnosis not present

## 2017-07-14 DIAGNOSIS — Z794 Long term (current) use of insulin: Secondary | ICD-10-CM

## 2017-07-14 DIAGNOSIS — E118 Type 2 diabetes mellitus with unspecified complications: Secondary | ICD-10-CM

## 2017-07-14 DIAGNOSIS — E669 Obesity, unspecified: Secondary | ICD-10-CM

## 2017-07-14 MED ORDER — INSULIN DETEMIR 100 UNIT/ML FLEXPEN: PEN_INJECTOR | SUBCUTANEOUS | 0 refills | Status: DC

## 2017-07-14 NOTE — Progress Notes (Signed)
Medical Nutrition Therapy:  Appt start time: 930 end time:  1000  Assessment:  Primary concerns today: Diabetes Type 2  Lab Results  Component Value Date   HGBA1C 9.9 (H) 07/12/2017  Saw Dr. Dorris Fetch today. Reduced Metformin 1000 mg a day. Levemir increased to 100 units  A day andApridra 30 unit with meals and sliding scale. Has his stationary bike and he has started to ride that for exercise.  Has Stg 3 CKD. Has Elenor Legato and uses it. He notes his portions are still to high. Hafs been drinkingn sodas and sweet tea. Hasn't lost any weight. Plays some golf.     A1C went up from 9% to 9.7%.  CMP Latest Ref Rng & Units 07/12/2017 03/18/2017 01/05/2017  Glucose 65 - 139 mg/dL 192(H) - 187(H)  BUN 7 - 25 mg/dL 18 10 17   Creatinine 0.70 - 1.25 mg/dL 1.35(H) 0.9 1.04  Sodium 135 - 146 mmol/L 142 - 139  Potassium 3.5 - 5.3 mmol/L 4.4 - 4.1  Chloride 98 - 110 mmol/L 106 - 104  CO2 20 - 32 mmol/L 28 - 27  Calcium 8.6 - 10.3 mg/dL 9.1 - 9.2  Total Protein 6.1 - 8.1 g/dL 7.6 - -  Total Bilirubin 0.2 - 1.2 mg/dL 0.7 - -  Alkaline Phos 40 - 115 U/L - - -  AST 10 - 35 U/L 24 - -  ALT 9 - 46 U/L 39 - -    Lab Results  Component Value Date   HGBA1C 9.9 (H) 07/12/2017  80 units Levemir Apridea 30 units TID plus sliding scale.  Metformin 1000 mg BID.   physical actvity:  ADL and bowls a little.   Preferred Learning Style:   No preference indicated   Learning Readiness:  Ready  Change in progress   MEDICATIONS:   DIETARY INTAKE:  24-hr recall:  B ( 8 AM): Egg sandwich with bacon, cheese and white bread 2, tomotato and 1 cup milk 2% 1 cup Snk ( AM): none L ( 1 PM):chicken salad, water  Snk ( PM):  D ( 5-6 PM):  Slaw, polish sausage and pork n beans water, Snk ( PM):  Walnuts  Beverages: water, coffee, soda,   Usual physical activity: ADL  Estimated energy needs: 1800  calories 200 g carbohydrates 135 g protein 50 g fat  Progress Towards Goal(s):  In progress.   Nutritional  Diagnosis:  NB-1.1 Food and nutrition-related knowledge deficit As related to Diabetes.  As evidenced by A1C > 9%.    Intervention:  Nutrition and Diabetes education provided on My Plate, CHO counting, meal planning, portion sizes, timing of meals, avoiding snacks between meals unless having a low blood sugar, target ranges for A1C and blood sugars, signs/symptoms and treatment of hyper/hypoglycemia, monitoring blood sugars, taking medications as prescribed, benefits of exercising 30 minutes per day and prevention of complications of DM.   Goals Don't over correct for low blood sugars 2. Cut down on protions 3. Eat 45-60 grams of carbs Ride exercise bike or walk daily. Lose 1-2 lbs per  Take Levermir 9 pm at night instead of 11 pm Drink only water. Cut out sodas  Walk after dinner.   Teaching Method Utilized: Visual Auditory Hands on  Handouts given during visit include:  The Plate Method  Meal Plan Card  Diabetes Instructions.   Barriers to learning/adherence to lifestyle change: none  Demonstrated degree of understanding via:  Teach Back   Monitoring/Evaluation:  Dietary intake, exercise, meal planning, SBG,  and body weight in 3 month(s).

## 2017-07-14 NOTE — Progress Notes (Signed)
Subjective:    Patient ID: Alex Cortez, male    DOB: Jul 13, 1950, PCP Burdine, Virgina Evener, MD   Past Medical History:  Diagnosis Date  . CAD (coronary artery disease)    nonobstructive CAD/patent LAD stent site; EF 65%, 08/2011; NSTEMI/DES LAD, 6/20 11  . Chest pain   . Diabetes mellitus    insulin-dependent  . Dyslipidemia   . HTN (hypertension)   . Post MI syndrome [facility]) 08-29-2009   Past Surgical History:  Procedure Laterality Date  . CORONARY ANGIOPLASTY WITH STENT PLACEMENT     Social History   Socioeconomic History  . Marital status: Married    Spouse name: Not on file  . Number of children: Not on file  . Years of education: Not on file  . Highest education level: Not on file  Occupational History  . Not on file  Social Needs  . Financial resource strain: Not on file  . Food insecurity:    Worry: Not on file    Inability: Not on file  . Transportation needs:    Medical: Not on file    Non-medical: Not on file  Tobacco Use  . Smoking status: Former Smoker    Packs/day: 0.50    Years: 23.00    Pack years: 11.50    Types: Cigarettes    Start date: 03/16/1955    Last attempt to quit: 03/15/1988    Years since quitting: 29.3  . Smokeless tobacco: Never Used  Substance and Sexual Activity  . Alcohol use: No    Alcohol/week: 0.0 oz  . Drug use: No  . Sexual activity: Not on file  Lifestyle  . Physical activity:    Days per week: Not on file    Minutes per session: Not on file  . Stress: Not on file  Relationships  . Social connections:    Talks on phone: Not on file    Gets together: Not on file    Attends religious service: Not on file    Active member of club or organization: Not on file    Attends meetings of clubs or organizations: Not on file    Relationship status: Not on file  Other Topics Concern  . Not on file  Social History Narrative   Works at [employer]. Married.    Outpatient Encounter Medications as of 07/14/2017  Medication Sig   . amLODipine (NORVASC) 10 MG tablet Take 10 mg by mouth daily.    . APIDRA SOLOSTAR 100 UNIT/ML Solostar Pen INJECT 30-36 UNITS         SUBCUTANEOUSLY 3 TIMES A   DAY WITH MEALS  . aspirin 325 MG tablet Take 325 mg by mouth daily.  Marland Kitchen atorvastatin (LIPITOR) 80 MG tablet Take 1 tablet (80 mg total) by mouth every evening.  . benazepril (LOTENSIN) 40 MG tablet Take 40 mg by mouth 2 (two) times daily.   . carvedilol (COREG) 25 MG tablet TAKE 1 TABLET (25 MG TOTAL) BY MOUTH 2 (TWO) TIMES DAILY.  Marland Kitchen Continuous Blood Gluc Sensor (FREESTYLE LIBRE SENSOR SYSTEM) MISC Use one sensor every 10 days.  . Insulin Detemir (LEVEMIR FLEXTOUCH) 100 UNIT/ML Pen INJECT 100 UNITS SUBCUTANEOUSLY DAILY AT  10PM  . Insulin Glulisine (APIDRA SOLOSTAR IJ) Inject 30-36 Units as directed 3 (three) times daily with meals.  . isosorbide mononitrate (IMDUR) 30 MG 24 hr tablet TAKE 1 TABLET BY MOUTH DAILY  . metFORMIN (GLUCOPHAGE) 1000 MG tablet Take 500 mg by mouth 2 (two)  times daily with a meal.  . nitroGLYCERIN (NITROSTAT) 0.4 MG SL tablet Place 1 tablet (0.4 mg total) under the tongue every 5 (five) minutes as needed.  . ONE TOUCH ULTRA TEST test strip USE TO TEST BLOOD SUGAR FOUR TIMES DAILY E11.65  . [DISCONTINUED] LEVEMIR FLEXTOUCH 100 UNIT/ML Pen INJECT 80 UNITS            SUBCUTANEOUSLY DAILY AT    10PM   No facility-administered encounter medications on file as of 07/14/2017.    ALLERGIES: Allergies  Allergen Reactions  . Shrimp [Shellfish Allergy]     Swelling and hives    VACCINATION STATUS:  There is no immunization history on file for this patient.  Diabetes  He presents for his follow-up diabetic visit. He has type 2 diabetes mellitus. Onset time: He was diagnosed at approximate age of 47 years. His disease course has been worsening. There are no hypoglycemic associated symptoms. Pertinent negatives for hypoglycemia include no confusion, headaches, pallor or seizures. Pertinent negatives for diabetes  include no chest pain, no fatigue, no polydipsia, no polyphagia, no polyuria and no weakness. There are no hypoglycemic complications. Symptoms are worsening. Diabetic complications include a CVA and nephropathy. Risk factors for coronary artery disease include diabetes mellitus, dyslipidemia, hypertension, male sex, obesity and sedentary lifestyle. Current diabetic treatment includes intensive insulin program. He is compliant with treatment most of the time. His weight is stable. He is following a generally unhealthy (He consumes a large quantity of soda.) diet. When asked about meal planning, he reported none. He has had a previous visit with a dietitian. He never participates in exercise. His home blood glucose trend is increasing steadily. His breakfast blood glucose range is generally >200 mg/dl. His lunch blood glucose range is generally >200 mg/dl. His dinner blood glucose range is generally >200 mg/dl. His bedtime blood glucose range is generally >200 mg/dl. His overall blood glucose range is >200 mg/dl. An ACE inhibitor/angiotensin II receptor blocker is being taken. Eye exam is current.  Hyperlipidemia  This is a chronic problem. The current episode started more than 1 year ago. The problem is controlled. Exacerbating diseases include chronic renal disease, diabetes and obesity. Pertinent negatives include no chest pain, leg pain, myalgias or shortness of breath. Current antihyperlipidemic treatment includes statins. Risk factors for coronary artery disease include diabetes mellitus, dyslipidemia, hypertension, male sex, obesity and a sedentary lifestyle.  Hypertension  This is a chronic problem. The current episode started more than 1 year ago. The problem is controlled. Pertinent negatives include no chest pain, headaches, neck pain, palpitations or shortness of breath. Risk factors for coronary artery disease include obesity, male gender, dyslipidemia, diabetes mellitus, sedentary lifestyle and  family history. Past treatments include ACE inhibitors. Hypertensive end-organ damage includes kidney disease and CVA. Identifiable causes of hypertension include chronic renal disease.    Review of Systems  Constitutional: Negative for fatigue and unexpected weight change.  HENT: Negative for dental problem, mouth sores and trouble swallowing.   Eyes: Negative for visual disturbance.  Respiratory: Negative for cough, choking, chest tightness, shortness of breath and wheezing.   Cardiovascular: Negative for chest pain, palpitations and leg swelling.  Gastrointestinal: Negative for abdominal distention, abdominal pain, constipation, diarrhea, nausea and vomiting.  Endocrine: Negative for polydipsia, polyphagia and polyuria.  Genitourinary: Negative for dysuria, flank pain, hematuria and urgency.  Musculoskeletal: Negative for back pain, gait problem, myalgias and neck pain.  Skin: Negative for pallor, rash and wound.  Neurological: Negative for seizures, syncope, weakness,  numbness and headaches.  Psychiatric/Behavioral: Negative.  Negative for confusion and dysphoric mood.    Objective:    BP 138/75   Pulse 76   Ht 6\' 3"  (1.905 m)   Wt 265 lb (120.2 kg)   BMI 33.12 kg/m   Wt Readings from Last 3 Encounters:  07/14/17 265 lb (120.2 kg)  06/06/17 265 lb 9.6 oz (120.5 kg)  04/27/17 265 lb (120.2 kg)    Physical Exam  Constitutional: He is oriented to person, place, and time. He appears well-developed. He is cooperative. No distress.  HENT:  Head: Normocephalic and atraumatic.  Eyes: EOM are normal.  Neck: Normal range of motion. Neck supple. No tracheal deviation present. No thyromegaly present.  Cardiovascular: Normal rate, S1 normal and S2 normal. Exam reveals no gallop.  No murmur heard. Pulses:      Dorsalis pedis pulses are 1+ on the right side, and 1+ on the left side.       Posterior tibial pulses are 1+ on the right side, and 1+ on the left side.  Pulmonary/Chest:  Effort normal. No respiratory distress. He has no wheezes.  Abdominal: Bowel sounds are normal. He exhibits no distension. There is no tenderness. There is no guarding and no CVA tenderness.     Musculoskeletal: He exhibits no edema.       Right shoulder: He exhibits no swelling and no deformity.  Neurological: He is alert and oriented to person, place, and time. He has normal strength and normal reflexes. No cranial nerve deficit or sensory deficit. Gait normal.  Skin: Skin is warm and dry. No rash noted. No cyanosis. Nails show no clubbing.  Psychiatric: He has a normal mood and affect. His speech is normal. Judgment normal. Cognition and memory are normal.   Recent Results (from the past 2160 hour(s))  COMPLETE METABOLIC PANEL WITH GFR     Status: Abnormal   Collection Time: 07/12/17 11:14 AM  Result Value Ref Range   Glucose, Bld 192 (H) 65 - 139 mg/dL    Comment: .        Non-fasting reference interval .    BUN 18 7 - 25 mg/dL   Creat 1.35 (H) 0.70 - 1.25 mg/dL    Comment: For patients >30 years of age, the reference limit for Creatinine is approximately 13% higher for people identified as African-American. .    GFR, Est Non African American 54 (L) > OR = 60 mL/min/1.52m2   GFR, Est African American 63 > OR = 60 mL/min/1.44m2   BUN/Creatinine Ratio 13 6 - 22 (calc)   Sodium 142 135 - 146 mmol/L   Potassium 4.4 3.5 - 5.3 mmol/L   Chloride 106 98 - 110 mmol/L   CO2 28 20 - 32 mmol/L   Calcium 9.1 8.6 - 10.3 mg/dL   Total Protein 7.6 6.1 - 8.1 g/dL   Albumin 4.1 3.6 - 5.1 g/dL   Globulin 3.5 1.9 - 3.7 g/dL (calc)   AG Ratio 1.2 1.0 - 2.5 (calc)   Total Bilirubin 0.7 0.2 - 1.2 mg/dL   Alkaline phosphatase (APISO) 73 40 - 115 U/L   AST 24 10 - 35 U/L   ALT 39 9 - 46 U/L  Hemoglobin A1c     Status: Abnormal   Collection Time: 07/12/17 11:14 AM  Result Value Ref Range   Hgb A1c MFr Bld 9.9 (H) <5.7 % of total Hgb    Comment: For someone without known diabetes, a hemoglobin  A1c value  of 6.5% or greater indicates that they may have  diabetes and this should be confirmed with a follow-up  test. . For someone with known diabetes, a value <7% indicates  that their diabetes is well controlled and a value  greater than or equal to 7% indicates suboptimal  control. A1c targets should be individualized based on  duration of diabetes, age, comorbid conditions, and  other considerations. . Currently, no consensus exists regarding use of hemoglobin A1c for diagnosis of diabetes for children. .    Mean Plasma Glucose 237 (calc)   eAG (mmol/L) 13.2 (calc)   Lipid Panel     Component Value Date/Time   CHOL 112 03/18/2017   TRIG 68 03/18/2017   HDL 26 (A) 03/18/2017   CHOLHDL 4.3 06/08/2016 0738   VLDL 22 06/08/2016 0738   LDLCALC 72 03/18/2017     Assessment & Plan:   1. Type 2 diabetes mellitus with vascular disease (HCC)  -His  diabetes is  complicated by CVA and patient remains at a high risk for more acute and chronic complications of diabetes which include CAD, CVA, CKD, retinopathy, and neuropathy. These are all discussed in detail with the patient.  Patient came with loss of control of glycemia with his libre device showing average blood glucose of 221, time in range only 24% of the time, hypoglycemia 75% of the time, hypoglycemia 1% of time.  He admits to dietary indiscretion including consumption of large quantities of soda.  His A1c remains high at 9.9%.    Glucose logs and insulin administration records pertaining to this visit,  to be scanned into patient's records.  Recent labs reviewed.  - I have re-counseled the patient on diet management and weight loss  by adopting a carbohydrate restricted / protein rich  Diet. - He is now open to proceed with a dietitian.  -  Suggestion is made for him to avoid simple carbohydrates  from his diet including Cakes, Sweet Desserts / Pastries, Ice Cream, Soda (diet and regular), Sweet Tea, Candies, Chips,  Cookies, Store Bought Juices, Alcohol in Excess of  1-2 drinks a day, Artificial Sweeteners, and "Sugar-free" Products. This will help patient to have stable blood glucose profile and potentially avoid unintended weight gain.  - Patient is advised to stick to a routine mealtimes to eat 3 meals  a day and avoid unnecessary snacks ( to snack only to correct hypoglycemia).  - I have approached patient with the following individualized plan to manage diabetes and patient agrees.  -Based on his CGM analysis, his current glycemic profile if currently above target and significantly different than his last visit, mainly driven by his dietary indiscretion.  -  Emphasizing the need for strict compliance, I advised him to increase Levemir to 100  units daily at bedtime, continue Apidra   30 units 3 times a day before meals for pre-meal blood glucose above 70 mg/dL, associated with documenting of blood glucose before meals and at bedtime.  -I will proceed to lower metformin to 500 mg p.o. twice daily.   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  Patient is advised to call clinic if he registers blood glucose less than 70 mg/dL or greater than 300 mg/dL.  - He reports that he did not qualify for bariatric surgery for Medicare coverage due to the fact that he is BMI is 32. - Patient specific target  for A1c; LDL, HDL, Triglycerides, and  Waist Circumference were discussed in detail.  2) BP/HTN:  His blood pressure is controlled to target . I advised him to continue current medications including benazepril 40 mg p.o. twice daily.  3) Lipids/HPL: His lipid panel is controlled with LDL of 72.  He is advised to continue atorvastatin 80 mg p.o. nightly.   4)  Weight/Diet: Has no success for weight control mainly due to his dietary indiscretion.  He has consulted with CDE,   And in progress.   5) Chronic  Care/Health Maintenance:  -Patient  is  on ACEI/ARB and Statin medications and encouraged to continue to follow up with Ophthalmology, Podiatrist at least yearly or according to recommendations, and advised to  stay away from smoking. I have recommended yearly flu vaccine and pneumonia vaccination at least every 5 years; moderate intensity exercise for up to 150 minutes weekly; and  sleep for at least 7 hours a day.  - I advised patient to maintain close follow up with Burdine, Virgina Evener, MD for primary care needs.  - Time spent with the patient: 25 min, of which >50% was spent in reviewing his blood glucose logs , discussing his hypo- and hyper-glycemic episodes, reviewing his current and  previous labs and insulin doses and developing a plan to avoid hypo- and hyper-glycemia. Please refer to Patient Instructions for Blood Glucose Monitoring and Insulin/Medications Dosing Guide"  in media tab for additional information. Marvene Staff Macke participated in the discussions, expressed understanding, and voiced agreement with the above plans.  All questions were answered to his satisfaction. he is encouraged to contact clinic should he have any questions or concerns prior to his return visit.  Follow up plan: -Return in about 3 months (around 10/14/2017) for follow up with pre-visit labs, meter, and logs.  Glade Lloyd, MD Phone: [phone]  Fax: [phone]  -  This note was partially dictated with voice recognition software. Similar sounding words can be transcribed inadequately or may not  be corrected upon review.  07/14/2017, 9:07 AM

## 2017-07-14 NOTE — Patient Instructions (Signed)

## 2017-07-14 NOTE — Patient Instructions (Addendum)
Goals Don't over correct for low blood sugars 2. Cut down on protions 3. Eat 45-60 grams of carbs Ride exercise bike or walk daily. Lose 1-2 lbs per  Take Levermir 9 pm at night instead of 11 pm Drink only water. Cut out sodas  Walk after dinner.

## 2017-07-28 ENCOUNTER — Encounter: Payer: Self-pay | Admitting: Nutrition

## 2017-07-31 ENCOUNTER — Emergency Department (HOSPITAL_COMMUNITY): Payer: Medicare Other

## 2017-07-31 ENCOUNTER — Inpatient Hospital Stay (HOSPITAL_COMMUNITY)
Admission: EM | Admit: 2017-07-31 | Discharge: 2017-08-03 | DRG: 065 | Disposition: A | Discharge status: Home / Self Care | Payer: Medicare Other | Attending: Student in an Organized Health Care Education/Training Program | Admitting: Student in an Organized Health Care Education/Training Program

## 2017-07-31 ENCOUNTER — Encounter (HOSPITAL_COMMUNITY): Payer: Self-pay | Admitting: Emergency Medicine

## 2017-07-31 ENCOUNTER — Other Ambulatory Visit: Payer: Self-pay

## 2017-07-31 ENCOUNTER — Other Ambulatory Visit: Payer: Self-pay | Admitting: Cardiovascular Disease

## 2017-07-31 DIAGNOSIS — G459 Transient cerebral ischemic attack, unspecified: Secondary | ICD-10-CM | POA: Diagnosis not present

## 2017-07-31 DIAGNOSIS — R2990 Unspecified symptoms and signs involving the nervous system: Secondary | ICD-10-CM | POA: Diagnosis not present

## 2017-07-31 DIAGNOSIS — E1151 Type 2 diabetes mellitus with diabetic peripheral angiopathy without gangrene: Secondary | ICD-10-CM | POA: Diagnosis present

## 2017-07-31 DIAGNOSIS — I6523 Occlusion and stenosis of bilateral carotid arteries: Secondary | ICD-10-CM | POA: Diagnosis not present

## 2017-07-31 DIAGNOSIS — Z87891 Personal history of nicotine dependence: Secondary | ICD-10-CM

## 2017-07-31 DIAGNOSIS — Z955 Presence of coronary angioplasty implant and graft: Secondary | ICD-10-CM

## 2017-07-31 DIAGNOSIS — I63019 Cerebral infarction due to thrombosis of unspecified vertebral artery: Secondary | ICD-10-CM

## 2017-07-31 DIAGNOSIS — R29810 Facial weakness: Secondary | ICD-10-CM | POA: Diagnosis not present

## 2017-07-31 DIAGNOSIS — G8191 Hemiplegia, unspecified affecting right dominant side: Secondary | ICD-10-CM | POA: Diagnosis present

## 2017-07-31 DIAGNOSIS — E669 Obesity, unspecified: Secondary | ICD-10-CM | POA: Diagnosis present

## 2017-07-31 DIAGNOSIS — I6930 Unspecified sequelae of cerebral infarction: Secondary | ICD-10-CM

## 2017-07-31 DIAGNOSIS — R4701 Aphasia: Secondary | ICD-10-CM | POA: Diagnosis not present

## 2017-07-31 DIAGNOSIS — E782 Mixed hyperlipidemia: Secondary | ICD-10-CM

## 2017-07-31 DIAGNOSIS — D320 Benign neoplasm of cerebral meninges: Secondary | ICD-10-CM | POA: Diagnosis present

## 2017-07-31 DIAGNOSIS — Z794 Long term (current) use of insulin: Secondary | ICD-10-CM

## 2017-07-31 DIAGNOSIS — I69331 Monoplegia of upper limb following cerebral infarction affecting right dominant side: Secondary | ICD-10-CM | POA: Diagnosis not present

## 2017-07-31 DIAGNOSIS — I10 Essential (primary) hypertension: Secondary | ICD-10-CM | POA: Diagnosis present

## 2017-07-31 DIAGNOSIS — R531 Weakness: Secondary | ICD-10-CM | POA: Diagnosis not present

## 2017-07-31 DIAGNOSIS — J329 Chronic sinusitis, unspecified: Secondary | ICD-10-CM | POA: Diagnosis present

## 2017-07-31 DIAGNOSIS — Z7982 Long term (current) use of aspirin: Secondary | ICD-10-CM

## 2017-07-31 DIAGNOSIS — E1169 Type 2 diabetes mellitus with other specified complication: Secondary | ICD-10-CM

## 2017-07-31 DIAGNOSIS — I2510 Atherosclerotic heart disease of native coronary artery without angina pectoris: Secondary | ICD-10-CM | POA: Diagnosis present

## 2017-07-31 DIAGNOSIS — Z8249 Family history of ischemic heart disease and other diseases of the circulatory system: Secondary | ICD-10-CM

## 2017-07-31 DIAGNOSIS — E785 Hyperlipidemia, unspecified: Secondary | ICD-10-CM | POA: Diagnosis present

## 2017-07-31 DIAGNOSIS — R471 Dysarthria and anarthria: Secondary | ICD-10-CM | POA: Diagnosis present

## 2017-07-31 DIAGNOSIS — I6340 Cerebral infarction due to embolism of unspecified cerebral artery: Secondary | ICD-10-CM

## 2017-07-31 DIAGNOSIS — I252 Old myocardial infarction: Secondary | ICD-10-CM

## 2017-07-31 DIAGNOSIS — I63512 Cerebral infarction due to unspecified occlusion or stenosis of left middle cerebral artery: Principal | ICD-10-CM | POA: Diagnosis present

## 2017-07-31 DIAGNOSIS — E1165 Type 2 diabetes mellitus with hyperglycemia: Secondary | ICD-10-CM | POA: Diagnosis present

## 2017-07-31 DIAGNOSIS — I639 Cerebral infarction, unspecified: Secondary | ICD-10-CM | POA: Diagnosis present

## 2017-07-31 DIAGNOSIS — Z79899 Other long term (current) drug therapy: Secondary | ICD-10-CM

## 2017-07-31 DIAGNOSIS — R29703 NIHSS score 3: Secondary | ICD-10-CM | POA: Diagnosis present

## 2017-07-31 DIAGNOSIS — J330 Polyp of nasal cavity: Secondary | ICD-10-CM | POA: Diagnosis present

## 2017-07-31 DIAGNOSIS — I63412 Cerebral infarction due to embolism of left middle cerebral artery: Secondary | ICD-10-CM

## 2017-07-31 DIAGNOSIS — Z6833 Body mass index (BMI) 33.0-33.9, adult: Secondary | ICD-10-CM

## 2017-07-31 HISTORY — DX: Cerebral infarction, unspecified: I63.9

## 2017-07-31 LAB — I-STAT CHEM 8, ED
BUN: 15 mg/dL (ref 6–20)
CREATININE: 0.9 mg/dL (ref 0.61–1.24)
Calcium, Ion: 1.19 mmol/L (ref 1.15–1.40)
Chloride: 104 mmol/L (ref 101–111)
Glucose, Bld: 193 mg/dL — ABNORMAL HIGH (ref 65–99)
HEMATOCRIT: 45 % (ref 39.0–52.0)
HEMOGLOBIN: 15.3 g/dL (ref 13.0–17.0)
Potassium: 4.3 mmol/L (ref 3.5–5.1)
SODIUM: 143 mmol/L (ref 135–145)
TCO2: 28 mmol/L (ref 22–32)

## 2017-07-31 LAB — I-STAT TROPONIN, ED: TROPONIN I, POC: 0.01 ng/mL (ref 0.00–0.08)

## 2017-07-31 LAB — CBC
HEMATOCRIT: 45 % (ref 39.0–52.0)
Hemoglobin: 14.2 g/dL (ref 13.0–17.0)
MCH: 26.2 pg (ref 26.0–34.0)
MCHC: 31.6 g/dL (ref 30.0–36.0)
MCV: 83.2 fL (ref 78.0–100.0)
PLATELETS: 221 10*3/uL (ref 150–400)
RBC: 5.41 MIL/uL (ref 4.22–5.81)
RDW: 13.3 % (ref 11.5–15.5)
WBC: 6 10*3/uL (ref 4.0–10.5)

## 2017-07-31 LAB — COMPREHENSIVE METABOLIC PANEL
ALBUMIN: 3.9 g/dL (ref 3.5–5.0)
ALK PHOS: 72 U/L (ref 38–126)
ALT: 56 U/L (ref 17–63)
ANION GAP: 10 (ref 5–15)
AST: 32 U/L (ref 15–41)
BILIRUBIN TOTAL: 1 mg/dL (ref 0.3–1.2)
BUN: 14 mg/dL (ref 6–20)
CALCIUM: 9.3 mg/dL (ref 8.9–10.3)
CO2: 26 mmol/L (ref 22–32)
Chloride: 105 mmol/L (ref 101–111)
Creatinine, Ser: 1.12 mg/dL (ref 0.61–1.24)
GFR calc Af Amer: >60 mL/min (ref >60)
GLUCOSE: 197 mg/dL — AB (ref 65–99)
POTASSIUM: 4.5 mmol/L (ref 3.5–5.1)
Sodium: 141 mmol/L (ref 135–145)
TOTAL PROTEIN: 7.9 g/dL (ref 6.5–8.1)

## 2017-07-31 LAB — DIFFERENTIAL
Abs Immature Granulocytes: 0 10*3/uL (ref 0.0–0.1)
Basophils Absolute: 0 10*3/uL (ref 0.0–0.1)
Basophils Relative: 1 %
EOS ABS: 0.3 10*3/uL (ref 0.0–0.7)
EOS PCT: 5 %
Immature Granulocytes: 0 %
LYMPHS ABS: 2.1 10*3/uL (ref 0.7–4.0)
LYMPHS PCT: 35 %
MONOS PCT: 8 %
Monocytes Absolute: 0.5 10*3/uL (ref 0.1–1.0)
Neutro Abs: 3.1 10*3/uL (ref 1.7–7.7)
Neutrophils Relative %: 51 %

## 2017-07-31 LAB — GLUCOSE, CAPILLARY
Glucose-Capillary: 193 mg/dL — ABNORMAL HIGH (ref 65–99)
Glucose-Capillary: 291 mg/dL — ABNORMAL HIGH (ref 65–99)
Glucose-Capillary: 275 mg/dL — ABNORMAL HIGH (ref 65–99)

## 2017-07-31 LAB — PROTIME-INR
INR: 0.98
Prothrombin Time: 12.9 seconds (ref 11.4–15.2)

## 2017-07-31 LAB — APTT: APTT: 26 s (ref 24–36)

## 2017-07-31 LAB — CBG MONITORING, ED: GLUCOSE-CAPILLARY: 193 mg/dL — AB (ref 65–99)

## 2017-07-31 MED ORDER — CARVEDILOL 12.5 MG PO TABS
25.0000 mg | ORAL_TABLET | Freq: Once | ORAL | Status: AC
  Administered 2017-07-31: 25 mg via ORAL
  Filled 2017-07-31: qty 2

## 2017-07-31 MED ORDER — ACETAMINOPHEN 325 MG PO TABS: 650.0000 mg | ORAL_TABLET | Freq: Four times a day (QID) | ORAL | Status: DC | PRN

## 2017-07-31 MED ORDER — STROKE: EARLY STAGES OF RECOVERY BOOK
Freq: Once | Status: AC
  Administered 2017-07-31: 13:00:00
  Filled 2017-07-31: qty 1

## 2017-07-31 MED ORDER — ENOXAPARIN SODIUM 40 MG/0.4ML ~~LOC~~ SOLN
40.0000 mg | SUBCUTANEOUS | Status: DC
  Administered 2017-07-31 – 2017-08-03 (×4): 40 mg via SUBCUTANEOUS
  Filled 2017-07-31 (×4): qty 0.4

## 2017-07-31 MED ORDER — INSULIN DETEMIR 100 UNIT/ML ~~LOC~~ SOLN
50.0000 [IU] | Freq: Every day | SUBCUTANEOUS | Status: DC
  Administered 2017-07-31: 50 [IU] via SUBCUTANEOUS
  Filled 2017-07-31: qty 0.5

## 2017-07-31 MED ORDER — ACETAMINOPHEN 650 MG RE SUPP: 650.0000 mg | Freq: Four times a day (QID) | RECTAL | Status: DC | PRN

## 2017-07-31 MED ORDER — AMLODIPINE BESYLATE 5 MG PO TABS
10.0000 mg | ORAL_TABLET | Freq: Once | ORAL | Status: AC
  Administered 2017-07-31: 10 mg via ORAL
  Filled 2017-07-31: qty 2

## 2017-07-31 MED ORDER — ASPIRIN 325 MG PO TABS
325.0000 mg | ORAL_TABLET | Freq: Every day | ORAL | Status: DC
  Administered 2017-07-31: 325 mg via ORAL
  Filled 2017-07-31: qty 1

## 2017-07-31 MED ORDER — INSULIN ASPART 100 UNIT/ML ~~LOC~~ SOLN
0.0000 [IU] | SUBCUTANEOUS | Status: DC
  Administered 2017-07-31: 4 [IU] via SUBCUTANEOUS
  Administered 2017-07-31: 8 [IU] via SUBCUTANEOUS
  Administered 2017-07-31: 2 [IU] via SUBCUTANEOUS
  Administered 2017-08-01: 5 [IU] via SUBCUTANEOUS
  Administered 2017-08-01: 3 [IU] via SUBCUTANEOUS
  Administered 2017-08-01: 5 [IU] via SUBCUTANEOUS

## 2017-07-31 MED ORDER — ATORVASTATIN CALCIUM 80 MG PO TABS
80.0000 mg | ORAL_TABLET | Freq: Every evening | ORAL | Status: DC
  Administered 2017-07-31 – 2017-08-03 (×4): 80 mg via ORAL
  Filled 2017-07-31 (×4): qty 1

## 2017-07-31 MED ORDER — IOPAMIDOL (ISOVUE-370) INJECTION 76%
INTRAVENOUS | Status: AC
  Filled 2017-07-31: qty 50

## 2017-07-31 MED ORDER — IOPAMIDOL (ISOVUE-370) INJECTION 76%
50.0000 mL | Freq: Once | INTRAVENOUS | Status: AC | PRN
  Administered 2017-07-31: 50 mL via INTRAVENOUS

## 2017-07-31 NOTE — Consult Note (Signed)
Neurology Consultation  Reason for Consult: Right-sided weakness Referring Physician: Dr. Lita Mains  CC: Right-sided weakness  History is obtained from: Patient, wife  HPI: Alex Cortez is a 67 y.o. male past medical history of a left pontine stroke with no residual deficits, coronary artery disease, poorly controlled diabetes, hypertension, hyperlipidemia, presented to the emergency room for evaluation of right-sided weakness that started around 6 PM on 07/30/2017. He said that he had sudden onset of weakness of his right arm that started at around 6 PM.  He also noted that he had difficulty reaching for objects with that arm and when he was trying to take a pill with his hand, he kept missing his mouth and was discoordinated.  He does not report any tingling or numbness.  He reports that with his prior stroke, he only had some facial drooping and that it completely resolved. His symptoms have persisted since 6:00.  He was seen in the ER outside the window for IV TPA.  He has no cortical signs is not a candidate for endovascular thymectomy. His diabetes is poorly controlled.  He saw an endocrinologist recently and his last A1c was 9.9.  His prior A1c's have been in the 9-10 ranges at least.  He continues to have dietary habits that contribute to his poor sugar control and he is trying to make changes to those habits. He is not a current smoker.  He does not drink alcohol.  He does not abuse drugs. He denied any nausea vomiting.  Denies any shortness of breath.  Denies any visual symptoms.  Denies any chest pain.  Denies easy bleeding or bruising. He is on aspirin 325 mg p.o. daily at home.  LKW: 6 PM on 07/30/2017 tpa given?: no, outside the window Premorbid modified Rankin scale (mRS): 0  ROS: ROS was performed and is negative except as noted in the HPI  Past Medical History:  Diagnosis Date  . CAD (coronary artery disease)    nonobstructive CAD/patent LAD stent site; EF 65%, 08/2011;  NSTEMI/DES LAD, 6/20 11  . Chest pain   . Diabetes mellitus    insulin-dependent  . Dyslipidemia   . HTN (hypertension)   . Post MI syndrome [facility]) 08-29-2009    Family History  Problem Relation Age of Onset  . Hypertension Other        in all family members   Social History:   reports that he quit smoking about 29 years ago. His smoking use included cigarettes. He started smoking about 62 years ago. He has a 11.50 pack-year smoking history. He has never used smokeless tobacco. He reports that he does not drink alcohol or use drugs.  Medications No current facility-administered medications for this encounter.   Current Outpatient Medications:  .  amLODipine (NORVASC) 10 MG tablet, Take 10 mg by mouth daily.  , Disp: , Rfl:  .  APIDRA SOLOSTAR 100 UNIT/ML Solostar Pen, INJECT 30-36 UNITS         SUBCUTANEOUSLY 3 TIMES A   DAY WITH MEALS, Disp: 75 mL, Rfl: 0 .  aspirin 325 MG tablet, Take 325 mg by mouth daily., Disp: , Rfl:  .  atorvastatin (LIPITOR) 80 MG tablet, Take 1 tablet (80 mg total) by mouth every evening., Disp: 90 tablet, Rfl: 3 .  benazepril (LOTENSIN) 40 MG tablet, Take 40 mg by mouth 2 (two) times daily. , Disp: , Rfl:  .  carvedilol (COREG) 25 MG tablet, TAKE 1 TABLET (25 MG TOTAL) BY MOUTH 2 (  TWO) TIMES DAILY., Disp: 180 tablet, Rfl: 2 .  Insulin Detemir (LEVEMIR FLEXTOUCH) 100 UNIT/ML Pen, INJECT 100 UNITS SUBCUTANEOUSLY DAILY AT  10PM, Disp: 75 mL, Rfl: 0 .  isosorbide mononitrate (IMDUR) 30 MG 24 hr tablet, TAKE 1 TABLET BY MOUTH DAILY, Disp: 30 tablet, Rfl: 4 .  metFORMIN (GLUCOPHAGE) 1000 MG tablet, Take 500 mg by mouth 2 (two) times daily with a meal., Disp: , Rfl: 3 .  nitroGLYCERIN (NITROSTAT) 0.4 MG SL tablet, Place 1 tablet (0.4 mg total) under the tongue every 5 (five) minutes as needed., Disp: 25 tablet, Rfl: 3 .  Continuous Blood Gluc Sensor ([place]) MISC, Use one sensor every 10 days., Disp: 3 each, Rfl: 2 .  ONE TOUCH ULTRA TEST test  strip, USE TO TEST BLOOD SUGAR FOUR TIMES DAILY E11.65, Disp: 150 each, Rfl: 5  Exam: Current vital signs: BP (!) 187/93   Pulse 72   Temp 98.2 F (36.8 C)   Resp 20   SpO2 95%  Vital signs in last 24 hours: Temp:  [97.7 F (36.5 C)-98.2 F (36.8 C)] 98.2 F (36.8 C) (05/19 0629) Pulse Rate:  [69-73] 72 (05/19 0747) Resp:  [17-20] 20 (05/19 0745) BP: (181-187)/(91-93) 187/93 (05/19 0747) SpO2:  [94 %-97 %] 95 % (05/19 0745) GENERAL: Awake, alert in NAD HEENT: - Normocephalic and atraumatic, dry mm, no LN++, no Thyromegally LUNGS - Clear to auscultation bilaterally with no wheezes CV - S1S2 RRR, no m/r/g, equal pulses bilaterally. ABDOMEN - Soft, nontender, nondistended with normoactive BS Ext: warm, well perfused, intact peripheral pulses, no edema  NEURO:  Mental Status: AA&Ox3  Language: speech is mildly dysarthric.  Naming, repetition, fluency, and comprehension intact. Cranial Nerves: PERRL. EOMI, visual fields full, mild left lower facial weakness, unable to lift the angle of the mouth on the left while smiling ,facial sensation intact, hearing intact, tongue/uvula/soft palate midline, normal sternocleidomastoid and trapezius muscle strength. No evidence of tongue atrophy or fibrillations Motor: 4+/5 right upper extremity, 5/5 right lower extremity, 5/5 left upper and left lower. Tone: is normal and bulk is normal Sensation- Intact to light touch bilaterally, no extinction Coordination: Finger-to-nose exam shows dysmetria on the right Gait- deferred  NIHSS-3 (1 for dysarthria, 1 for facial, 1 for ataxia)  Labs I have reviewed labs in epic and the results pertinent to this consultation are:  CBC    Component Value Date/Time   WBC 6.0 07/31/2017 0555   RBC 5.41 07/31/2017 0555   HGB 15.3 07/31/2017 0601   HCT 45.0 07/31/2017 0601   PLT 221 07/31/2017 0555   MCV 83.2 07/31/2017 0555   MCH 26.2 07/31/2017 0555   MCHC 31.6 07/31/2017 0555   RDW 13.3 07/31/2017 0555    LYMPHSABS 2.1 07/31/2017 0555   MONOABS 0.5 07/31/2017 0555   EOSABS 0.3 07/31/2017 0555   BASOSABS 0.0 07/31/2017 0555   CMP     Component Value Date/Time   NA 143 07/31/2017 0601   K 4.3 07/31/2017 0601   CL 104 07/31/2017 0601   CO2 26 07/31/2017 0555   GLUCOSE 193 (H) 07/31/2017 0601   BUN 15 07/31/2017 0601   BUN 10 03/18/2017   CREATININE 0.90 07/31/2017 0601   CREATININE 1.35 (H) 07/12/2017 1114   CALCIUM 9.3 07/31/2017 0555   PROT 7.9 07/31/2017 0555   ALBUMIN 3.9 07/31/2017 0555   AST 32 07/31/2017 0555   ALT 56 07/31/2017 0555   ALKPHOS 72 07/31/2017 0555   BILITOT 1.0 07/31/2017 0555  GFRNONAA >60 07/31/2017 0555   GFRNONAA 54 (L) 07/12/2017 1114   GFRAA >60 07/31/2017 0555   GFRAA 63 07/12/2017 1114   Lipid Panel     Component Value Date/Time   CHOL 112 03/18/2017   TRIG 68 03/18/2017   HDL 26 (A) 03/18/2017   CHOLHDL 4.3 06/08/2016 0738   VLDL 22 06/08/2016 0738   LDLCALC 72 03/18/2017   Imaging I have reviewed the images obtained: CT-scan of the brain-showed no evidence of bleed, no acute changes.  Chronic white matter disease.  Assessment:  67 year old with a history of coronary artery disease, hypertension, hyperlipidemia and poorly controlled diabetes along with a history of left pontine stroke many years ago with no residual deficits that had initially presented with facial droop at that time, now presents with right arm weakness.  Also reports facial weakness as well as on exam was noted to have some dysarthria. Interestingly the facial droop was on the opposite side of the arm and leg weakness. Likely a posterior circulation small vessel stroke involving the cerebellum of the brainstem.  Impression: Evaluate for acute ischemic stroke/TIA-likely small vessel etiology Hypertension Diabetes Hyperlipidemia Coronary artery disease  Recommendations: -Admit to hospitalist -Maintain on telemetry to evaluate for any evidence of underlying atrial  fibrillation. -He is on aspirin 325mg  PO Daily at home.  I will continue aspirin for now.  Based on the results of CTA of the head and neck, will decide if he needs dual antiplatelets.  A decision can be made to switch aspirin to Plavix if the MRI is positive for stroke.  We will defer the treatment to the stroke team after imaging results are made available. -MRI brain without contrast -2D echocardiogram -Hemoglobin A 1C, fasting lipid panel -Frequent neurochecks -Allow for permissive hypertension for 24 to 48 hours.  Treat only if systolic blood pressures are over 220, and treatment should only be on PRN basis.  His blood pressures should be normalized upon discharge with a goal of having his blood pressure less than 140/90. -Physical therapy evaluation -Speech therapy evaluation -Occupational therapy evaluation -N.p.o. until cleared for swallow  Plan was relayed to the ED provider- Marval Regal, PA-C All questions asked by the patient and his wife at bedside were answered. Please call with questions. Stroke neurology service will continue to follow with you -- Amie Portland, MD [facility] Pager: [phone] If 7pm to 7am, please call on call as listed on AMION.

## 2017-07-31 NOTE — ED Provider Notes (Signed)
[place] EMERGENCY DEPARTMENT Provider Note   CSN: [id number] Arrival date & time: 07/31/17  0540     History   Chief Complaint Chief Complaint  Patient presents with  . Stroke Symptoms    HPI Alex Cortez is a 67 y.o. male.  HPI   Alex Cortez is a 67 y.o. male, with a history of CAD, DM, dyslipidemia, HTN, and MI, presenting to the ED with right sided extremity weakness beginning around 6 PM 5/18.  Patient was sitting at rest when he noticed that his right arm "felt heavier."  He also noted difficulty walking due to weakness in the right leg.  Endorses some disequilibrium. Patient also endorses an episode of Patient states he has a previous history of stroke affecting his right side, however, he has had no lasting deficits. Patient had an episode of left-sided chest pain, sharp, 3/10, nonradiating that was intermittent for less than 10 minutes, but has resolved and has not recurred. Denies recent illness, fever/chills, nausea/vomiting, headache, vision abnormalities, shortness of breath, difficulty speaking or swallowing, falls/head injury, diaphoresis, or any other complaints.    Past Medical History:  Diagnosis Date  . CAD (coronary artery disease)    nonobstructive CAD/patent LAD stent site; EF 65%, 08/2011; NSTEMI/DES LAD, 6/20 11  . Chest pain   . Diabetes mellitus    insulin-dependent  . Dyslipidemia   . HTN (hypertension)   . Post MI syndrome ([place]) 08-29-2009  . Stroke [facility]) 10/22/2012   Left inferior pons    Patient Active Problem List   Diagnosis Date Noted  . Class 1 obesity due to excess calories with serious comorbidity and body mass index (BMI) of 32.0 to 32.9 in adult 09/09/2015  . Sedentary lifestyle 09/09/2015  . Mixed hyperlipidemia 02/24/2015  . Essential hypertension, benign 02/24/2015  . Exertional dyspnea 10/18/2012  . Diastolic heart failure ([place]) 10/01/2011  . Obstructive sleep apnea 09/02/2011  . LV dysfunction  09/02/2011  . ERECTILE DYSFUNCTION, SECONDARY TO MEDICATION 04/14/2010  . Type 2 diabetes mellitus with vascular disease ([place]) 10/02/2009  . ACUT MI SUBENDOCARDIAL INFARCT INIT EPIS CARE 10/02/2009  . CORONARY ATHEROSCLEROSIS NATIVE CORONARY ARTERY 10/02/2009  . CHEST PAIN 10/02/2009    Past Surgical History:  Procedure Laterality Date  . CORONARY ANGIOPLASTY WITH STENT PLACEMENT          Home Medications    Prior to Admission medications   Medication Sig Start Date End Date Taking? Authorizing Provider  amLODipine (NORVASC) 10 MG tablet Take 10 mg by mouth daily.     Yes [provider]  APIDRA SOLOSTAR 100 UNIT/ML Solostar Pen INJECT 30-36 UNITS         SUBCUTANEOUSLY 3 TIMES A   DAY WITH MEALS 05/30/17  Yes Nida, Marella Chimes, MD  aspirin 325 MG tablet Take 325 mg by mouth daily.   Yes [provider]  atorvastatin (LIPITOR) 80 MG tablet Take 1 tablet (80 mg total) by mouth every evening. 02/26/15  Yes Herminio Commons, MD  benazepril (LOTENSIN) 40 MG tablet Take 40 mg by mouth 2 (two) times daily.    Yes [provider]  carvedilol (COREG) 25 MG tablet TAKE 1 TABLET (25 MG TOTAL) BY MOUTH 2 (TWO) TIMES DAILY. 11/04/16  Yes Herminio Commons, MD  Insulin Detemir (LEVEMIR FLEXTOUCH) 100 UNIT/ML Pen INJECT 100 UNITS SUBCUTANEOUSLY DAILY AT  10PM 07/14/17  Yes Nida, Marella Chimes, MD  isosorbide mononitrate (IMDUR) 30 MG 24 hr tablet TAKE 1  TABLET BY MOUTH DAILY 05/03/13  Yes Herminio Commons, MD  metFORMIN (GLUCOPHAGE) 1000 MG tablet Take 500 mg by mouth 2 (two) times daily with a meal. 07/21/14  Yes [provider]  nitroGLYCERIN (NITROSTAT) 0.4 MG SL tablet Place 1 tablet (0.4 mg total) under the tongue every 5 (five) minutes as needed. 08/30/11  Yes de Stanford Scotland, MD  Continuous Blood Gluc Sensor ([place]) MISC Use one sensor every 10 days. 01/12/17   Cassandria Anger, MD  ONE TOUCH ULTRA TEST test strip USE TO  TEST BLOOD SUGAR FOUR TIMES DAILY E11.65 12/16/15   Cassandria Anger, MD    Family History Family History  Problem Relation Age of Onset  . Hypertension Other        in all family members    Social History Social History   Tobacco Use  . Smoking status: Former Smoker    Packs/day: 0.50    Years: 23.00    Pack years: 11.50    Types: Cigarettes    Start date: 03/16/1955    Last attempt to quit: 03/15/1988    Years since quitting: 29.3  . Smokeless tobacco: Never Used  Substance Use Topics  . Alcohol use: No    Alcohol/week: 0.0 oz  . Drug use: No     Allergies   Shrimp [shellfish allergy]   Review of Systems Review of Systems  Constitutional: Negative for chills and fever.  Respiratory: Negative for shortness of breath.   Cardiovascular: Positive for chest pain (resolved).  Gastrointestinal: Negative for abdominal pain, diarrhea, nausea and vomiting.  Musculoskeletal: Negative for neck pain.  Neurological: Positive for weakness. Negative for dizziness, syncope, speech difficulty, light-headedness, numbness and headaches.       Disequilibrium  All other systems reviewed and are negative.    Physical Exam Updated Vital Signs BP (!) 181/93 (BP Location: Left Arm)   Pulse 73   Temp 97.7 F (36.5 C) (Oral)   Resp 17   SpO2 97%   Physical Exam  Constitutional: He is oriented to person, place, and time. He appears well-developed and well-nourished. No distress.  HENT:  Head: Normocephalic and atraumatic.  Eyes: Pupils are equal, round, and reactive to light. Conjunctivae and EOM are normal.  Neck: Neck supple.  Cardiovascular: Normal rate, regular rhythm, normal heart sounds and intact distal pulses.  Pulmonary/Chest: Effort normal and breath sounds normal. No respiratory distress.  Abdominal: Soft. There is no tenderness. There is no guarding.  Musculoskeletal: He exhibits no edema.  Lymphadenopathy:    He has no cervical adenopathy.  Neurological: He is  alert and oriented to person, place, and time. He exhibits abnormal muscle tone.  No sensory deficits.  No noted speech deficits. No aphasia. Patient handles oral secretions without difficulty. No noted swallowing defects.  Arm drift on the right. Grip strength weaker on the right. Strength 4/5 in the shoulders and biceps/triceps on the right, 5/5 on the left. Strength 4/5 with flexion of the right hip, 5/5 on the left. Strength 5/5 with bilateral knees and ankles. Patellar DTRs 2+ bilaterally. Negative Romberg.  Difficulty with coordination in the right hand and arm.  Difficulty touching his thumb to each of his fingers and difficulty with finger-to-nose testing. Patient is able to ambulate without assistance or noted instability, however, he does have some apparent weakness in the right leg. Cranial nerves III-XII grossly intact.   Skin: Skin is warm and dry. He is not diaphoretic.  Psychiatric: He  has a normal mood and affect. His behavior is normal.  Nursing note and vitals reviewed.    ED Treatments / Results  Labs (all labs ordered are listed, but only abnormal results are displayed) Labs Reviewed  COMPREHENSIVE METABOLIC PANEL - Abnormal; Notable for the following components:      Result Value   Glucose, Bld 197 (*)    All other components within normal limits  CBG MONITORING, ED - Abnormal; Notable for the following components:   Glucose-Capillary 193 (*)    All other components within normal limits  I-STAT CHEM 8, ED - Abnormal; Notable for the following components:   Glucose, Bld 193 (*)    All other components within normal limits  PROTIME-INR  APTT  CBC  DIFFERENTIAL  I-STAT TROPONIN, ED    EKG EKG Interpretation  Date/Time:  Sunday Jul 31 2017 05:47:28 EDT Ventricular Rate:  74 PR Interval:    QRS Duration: 88 QT Interval:  379 QTC Calculation: 421 R Axis:   39 Text Interpretation:  Sinus rhythm Low voltage, precordial leads Confirmed by Ripley Fraise [phone]) on 07/31/2017 5:57:39 AM   Radiology Ct Head Wo Contrast  Result Date: 07/31/2017 CLINICAL DATA:  Acute onset of right arm numbness and right-sided facial droop. Right-sided weakness. EXAM: CT HEAD WITHOUT CONTRAST TECHNIQUE: Contiguous axial images were obtained from the base of the skull through the vertex without intravenous contrast. COMPARISON:  None. FINDINGS: Brain: No evidence of acute infarction, hemorrhage, hydrocephalus, extra-axial collection or mass lesion / mass effect. Prominence of the ventricles and sulci reflects mild cortical volume loss. Mild cerebellar atrophy is noted. Mild periventricular white matter change likely reflects small vessel ischemic microangiopathy. The brainstem and fourth ventricle are within normal limits. The basal ganglia are unremarkable in appearance. The cerebral hemispheres demonstrate grossly normal gray-white differentiation. No mass effect or midline shift is seen. Vascular: No hyperdense vessel or unexpected calcification. Skull: There is no evidence of fracture; visualized osseous structures are unremarkable in appearance. Sinuses/Orbits: The visualized portions of the orbits are within normal limits. There is opacification of the right side of the sphenoid sinus and partial opacification of the right frontal sinus. The remaining paranasal sinuses and mastoid air cells are well-aerated. Other: No significant soft tissue abnormalities are seen. IMPRESSION: 1. No acute intracranial pathology seen on CT. 2. Mild cortical volume loss and scattered small vessel ischemic microangiopathy. 3. Opacification of the right side of the sphenoid sinus and partial opacification of the right frontal sinus. Electronically Signed   By: Garald Balding M.D.   On: 07/31/2017 06:41    Procedures Procedures (including critical care time)  Medications Ordered in ED Medications  amLODipine (NORVASC) tablet 10 mg (10 mg Oral Given 07/31/17 0742)  carvedilol (COREG)  tablet 25 mg (25 mg Oral Given 07/31/17 0747)     Initial Impression / Assessment and Plan / ED Course  I have reviewed the triage vital signs and the nursing notes.  Pertinent labs & imaging results that were available during my care of the patient were reviewed by me and considered in my medical decision making (see chart for details).  Clinical Course as of Jul 31 840  Sun Jul 31, 2017  0724 Spoke with Dr. Rory Percy, neurologist. Recommends MRI. Admit via hospitalist.   [SJ]  [phone] Dr. Rory Percy evaluated patient and additionally recommends CTA head and neck.   [SJ]  [phone] Spoke with IM resident. Agrees to come see the patient for admission.    [SJ]  Clinical Course User Index [SJ] Lorayne Bender, PA-C    Patient presents with strokelike symptoms beginning around 6 PM on 5/18.  Risk factors include previous stroke, previous MI, obesity, and poorly controlled diabetes. VAN negative.  Admission for further evaluation and treatment. MR brain and CTA head and neck pending upon admission.   Findings and plan of care discussed with Julianne Rice, MD. Dr. Lita Mains personally evaluated and examined this patient.  Vitals:   07/31/17 0615 07/31/17 0629 07/31/17 0745 07/31/17 0747  BP: (!) 181/91  (!) 187/93 (!) 187/93  Pulse: 73  69 72  Resp: 19  20   Temp:  98.2 F (36.8 C)    TempSrc:      SpO2: 94%  95%      Final Clinical Impressions(s) / ED Diagnoses   Final diagnoses:  Stroke-like symptoms    ED Discharge Orders    None       Layla Maw 07/31/17 0844    Julianne Rice, MD 07/31/17 [phone]

## 2017-07-31 NOTE — Discharge Summary (Signed)
Name: Alex Cortez MRN: [id number] DOB: 02-Feb-1951 67 y.o. PCP: Alex Labrum, MD  Date of Admission: 07/31/2017  5:41 AM Date of Discharge: 08/03/2017 Attending Physician: Alex Cortez, *  Discharge Diagnosis: 1. Acute CVA 2. Presumed left frontal meningioma 3. Diabetes 4. HTN  Active Problems:   Hyperlipidemia   Cerebral embolism with cerebral infarction   CVA (cerebral vascular accident) ([place])   History of CVA with residual deficit   Coronary artery disease involving native coronary artery of native heart without angina pectoris   Essential hypertension   Diabetes mellitus type 2 in obese [facility])   Discharge Medications: Allergies as of 08/03/2017      Reactions   Shrimp [shellfish Allergy]    Swelling and hives      Medication List    STOP taking these medications   amLODipine 10 MG tablet Commonly known as:  NORVASC   carvedilol 25 MG tablet Commonly known as:  COREG   isosorbide mononitrate 30 MG 24 hr tablet Commonly known as:  IMDUR     TAKE these medications   APIDRA SOLOSTAR 100 UNIT/ML Solostar Pen Generic drug:  Insulin Glulisine INJECT 30-36 UNITS         SUBCUTANEOUSLY 3 TIMES A   DAY WITH MEALS   aspirin 325 MG tablet Take 325 mg by mouth daily.   atorvastatin 80 MG tablet Commonly known as:  LIPITOR Take 1 tablet (80 mg total) by mouth every evening.   benazepril 40 MG tablet Commonly known as:  LOTENSIN Take 0.5 tablets (20 mg total) by mouth daily. What changed:    how much to take  when to take this   clopidogrel 75 MG tablet Commonly known as:  PLAVIX Take 1 tablet (75 mg total) by mouth daily. Start taking on:  08/04/2017   Justice Misc Use one sensor every 10 days.   Insulin Detemir 100 UNIT/ML Pen Commonly known as:  LEVEMIR FLEXTOUCH INJECT 100 UNITS SUBCUTANEOUSLY DAILY AT  10PM   metFORMIN 1000 MG tablet Commonly known as:  GLUCOPHAGE Take 500 mg by mouth 2 (two) times daily  with a meal.   nitroGLYCERIN 0.4 MG SL tablet Commonly known as:  NITROSTAT Place 1 tablet (0.4 mg total) under the tongue every 5 (five) minutes as needed.   ONE TOUCH ULTRA TEST test strip Generic drug:  glucose blood USE TO TEST BLOOD SUGAR FOUR TIMES DAILY E11.65            Durable Medical Equipment  (From admission, onward)        Start     Ordered   08/03/17 1202  For home use only DME 3 n 1  Once     08/03/17 1201      Disposition and follow-up:   Alex Cortez was discharged from [facility] in Stable condition.  At the hospital follow up visit please address:  1.  Acute CVA: Residual deficits on discharge include RUE weakness (distal > proximal), very mild left lower facial droop, and aphasia. Please assess deficits. - Please ensure compliance with aspirin and plavix, will continue for 3 months, then switch to plavix alone - Ensure follow up with Neurology - Please set up for 30 day event monitor to assess for possible afib - Ensure outpatient rehab with PT/OT/Speech has been set up - BP goal as below  2.  HTN: sBP goal is 130-160. Patient discharged with only benazepril 20mg  daily and instructed to  hold other BP meds on discharge - Please adjust anti-hypertensive regimen as needed to reach goal  3.  New incidentally noted meningioma: Continue to monitor for s/s of intracranial mass effect. Ensure outpatient follow-up with Neurology for monitoring.  4.  Diabetes: Continue outpatient management  5.  Labs / imaging needed at time of follow-up: Outpatient 30 day event monitor  6.  Pending labs/ test needing follow-up: None  Follow-up Appointments: Follow-up Information    [facility] Follow up in 4 week(s).   Specialty:  Neurology Why:  stroke clinic. office will call with appt date and time Contact information: [place] Follow up.   Specialty:  Rehabilitation Why:  They will contact you for the first appointment. Contact information: [address] [id number] Prudy Feeler [place]       Alex Labrum, MD. Schedule an appointment as soon as possible for a visit in 1 week(s).   Specialty:  Family Medicine Contact information: [place] [address] [phone]           Hospital Course by problem list: Active Problems:   Hyperlipidemia   Cerebral embolism with cerebral infarction   CVA (cerebral vascular accident) ([place])   History of CVA with residual deficit   Coronary artery disease involving native coronary artery of native heart without angina pectoris   Essential hypertension   Diabetes mellitus type 2 in obese ([place])   1. Acute CVA of multiple areas in left cortex Patient was admitted with right-sided weakness, left facial droop, and dysarthria. No tPA administered due to presenting outside of the window. Imaging obtained showed scattered, primarily cortical acute infarctions along left cerebral convexity in a watershed pattern, as well as multiple small remote cerebellar infarcts. CT-A showed left M1 segment stenosis and 60% bilateral cavernous ICA narrowing. His stroke was thought to be secondary to thrombotic disease, however afib cannot be ruled out. He did have a TTE that was negative for valvular disease and LE doppler ultrasound was negative for DVT.   After 2 days of admission, his right upper extremity weakness and aphasia worsened. Repeat stat brain MRI was obtained, which showed extension of left stroke into left lateral putamen, as well as stable to minimal increase in size in previous infarcts. This finding was discussed with Neurology who did not feel that more invasive intervention was indicated. Blood pressure did not drop below 152/89 during this period in which he clinically worsened. Orthostatics were  also checked to assess for hypoperfusion when standing, which were negative.   Home full dose aspirin was changed to DAPT (aspirin 325 and plavix 75) for 3 months, followed by only plavix. Plan for systolic blood pressure goal is 130-160, so he was discharged on only benazepril 20mg  daily and encouraged to follow up with outpatient provider to continue BP medication titration. He was evaluated by therapy who recommend outpatient rehab with PT, OT, and speech. Discharged with plan to follow up with Neurology. Deficits on discharge include RUE weakness (distal > proximal), very mild left lower facial droop, and aphasia.  2. New 4.1x1.4cm presumed meningioma along lateral left frontal convexity Brain imaging with incidentally noted new mass along left frontal convexity without neighboring brain edema. This was new compared to most recent MRI brain scan available from 2015. Patient's presenting symptoms were felt to be related to the acute  infarction rather than the newly identified mass. He denied a history of seizures or other symptoms consistent with intracranial mass effect. He was discharged with a plan to follow-up with Outpatient Neurology.  3. Diabetes A1c 9.9 on 07/14/2017. Continue outpatient management.  4. Hx of HTN Home anti-hypertensives were held during his admission in the setting of acute CVA. Benazepril dose was decreased to 20mg  daily on discharge to keep sBP goal 130-160 and all other anti-hypertensives were held. Patient instructed to follow up with outpatient PCP to continue titration of anti-hypertensives.  Discharge Vitals:   BP (!) 163/78 (BP Location: Left Arm)   Pulse 87   Temp 97.8 F (36.6 C) (Oral)   Resp 17   Ht 6\' 3"  (1.905 m)   Wt 264 lb 8.8 oz (120 kg)   SpO2 94%   BMI 33.07 kg/m   Pertinent Labs, Studies, and Procedures:  CBC Latest Ref Rng & Units 07/31/2017 07/31/2017 09/22/2014  WBC 4.0 - 10.5 K/uL - 6.0 10.4  Hemoglobin 13.0 - 17.0 g/dL 15.3 14.2 16.3    Hematocrit 39.0 - 52.0 % 45.0 45.0 48.9  Platelets 150 - 400 K/uL - 221 242   CMP Latest Ref Rng & Units 08/01/2017 07/31/2017 07/31/2017  Glucose 65 - 99 mg/dL 216(H) 193(H) 197(H)  BUN 6 - 20 mg/dL 11 15 14   Creatinine 0.61 - 1.24 mg/dL 0.90 0.90 1.12  Sodium 135 - 145 mmol/L 141 143 141  Potassium 3.5 - 5.1 mmol/L 3.8 4.3 4.5  Chloride 101 - 111 mmol/L 105 104 105  CO2 22 - 32 mmol/L 25 - 26  Calcium 8.9 - 10.3 mg/dL 9.2 - 9.3  Total Protein 6.5 - 8.1 g/dL - - 7.9  Total Bilirubin 0.3 - 1.2 mg/dL - - 1.0  Alkaline Phos 38 - 126 U/L - - 72  AST 15 - 41 U/L - - 32  ALT 17 - 63 U/L - - 56   INR 0.98 Trop 0.00  CT head 07/31/2017 1. No acute intracranial pathology seen on CT. 2. Mild cortical volume loss and scattered small vessel ischemic microangiopathy. 3. Opacification of the right side of the sphenoid sinus and partial opacification of the right frontal sinus.  CT-A head/neck 07/31/2017 1. Negative for emergent large vessel occlusion. 2. Moderate to advanced left M1 segment stenosis due to eccentric filling defect, possibly symptomatic in this setting. 3. ~60% bilateral cavernous ICA narrowing due to calcified plaque. 4. No flow limiting stenosis in the neck. 5. Presumed meningioma along the left frontal convexity measuring up to 4.3 x 1.3 cm. 6. Left nasal cavity polyp herniating into the nasopharynx. Patchy bilateral sinusitis.  MRI brain 07/31/2017 1. Scattered, primarily cortical acute infarction along the left cerebral convexity, posterior watershed pattern. 2. Presumed meningioma along the lateral left frontal convexity measuring 4.1 x 1.4 cm. No neighboring brain edema. 3. Multiple small remote cerebellar infarcts. 4. Chronic sinusitis.  Left nasal cavity and nasopharynx polyp.  TTE 08/01/2017 - Left ventricle: The cavity size was normal. There was mild focal basal hypertrophy of the septum. Systolic function was normal. The estimated ejection fraction was in the  range of 55% to 60%. Wall motion was normal; there were no regional wall motion abnormalities. There was an increased relative contribution of atrial contraction to ventricular filling. Doppler parameters are consistent with abnormal left ventricular relaxation (grade 1 diastolic dysfunction). - Aortic valve: Trileaflet; mildly thickened, mildly calcified   leaflets. - Mitral valve: Calcified annulus. Thickening. Minimal focal   calcification  of the anterior leaflet. - Pulmonic valve: There was trivial regurgitation. - Pulmonary arteries: Systolic pressure could not be accurately estimated.  LE U/S 08/01/2017 Right: There is no evidence of deep vein thrombosis in the lower extremity. No cystic structure found in the popliteal fossa. Left: There is no evidence of deep vein thrombosis in the lower extremity. No cystic structure found in the popliteal fossa.  Repeat MRI brain 08/02/2017 1. New acute infarction within the left lateral putamen extending into corona radiata. 2. Multiple small late acute/early subacute infarctions in the left watershed distribution are stable to minimally increased in size in comparison with the prior MRI of the brain. 3. Interval petechial hemorrhage within the larger focus infarction in the left parietal lobe. 4. Stable left frontal convexity extra-axial mass, presumably meningioma.  Discharge Instructions: Discharge Instructions    Ambulatory referral to Neurology   Complete by:  As directed    Follow up with stroke clinic NP (Jessica Vanschaick or Cecille Rubin, if both not available, consider Dr. Antony Contras, Dr. Bess Harvest, or Dr. Sarina Ill) at [facility] in about 4 weeks.   Ambulatory referral to Occupational Therapy   Complete by:  As directed    Ambulatory referral to Physical Therapy   Complete by:  As directed    Ambulatory referral to Speech Therapy   Complete by:  As directed    Diet - low sodium heart healthy    Complete by:  As directed    Discharge instructions   Complete by:  As directed    Mr. Saldarriaga,  It was good to meet you. While you were in the hospital, we treated you for a stroke. To try to prevent this from happening again, it is very important that you take your medicines every day. - Please take aspirin 325mg  every day - Please take plavix 75mg  every day - You will take these for 3 months and then switch to only plavix. At your follow up visit with the brain doctors (Neurology), you will be able to discuss this more and the exact timing. - The Neurology office will contact you to schedule an appointment with them. If they do not call you in the next few days, please give their office a call.  Because of the strokes you had, our goal for the top number of your blood pressure is between 130 and 160. Because of this, we will be making changes to your blood pressure medicines. - Please STOP taking amlodipine, imdur (isosorbide mononitrate), and carvedilol (coreg) until you see your primary doctor. - Please take HALF a tablet of the benazepril once daily. (So that would be benazepril 20mg  once a day). - Please call your primary doctor's office to schedule a hospital follow up appointment with them. This will be very important because your primary doctor will be able to adjust your blood pressure medicine appropriately when you leave the hospital.  We are sending a referral to get you outpatient physical therapy, occupational therapy, and speech therapy.   Increase activity slowly   Complete by:  As directed      Signed: Colbert Ewing, MD 08/03/2017, 2:49 PM   Pager: Mamie Nick [phone]

## 2017-07-31 NOTE — H&P (Addendum)
Date: 08/01/2017               Patient Name:  Alex Cortez MRN: [id number]  DOB: 1951-01-31 Age / Sex: 67 y.o., male   PCP: Curlene Labrum, MD         Medical Service: Internal Medicine Teaching Service         Attending Physician: Dr. Evette Doffing, Mallie Mussel, *    First Contact: Dr. Ronalee Red Pager: [phone]  Second Contact: Dr. Philipp Ovens Pager: [phone]       After Hours (After 5p/  First Contact Pager: [phone]  weekends / holidays): Second Contact Pager: [phone]   Chief Complaint: right sided weakness  History of Present Illness:  [name] is a 66yo male with PMH of L pontine stroke in 2014 (with mild residual RUE weakness), CAD s/p DES to LAD in 2011, diabetes, and HTN who presents with right-sided weakness and feeling "wobbly". Wife at bedside who assists with history.  Patient was in his usual state of health watching TV until about 6pm on 5/18 when he noted acute onset of feeling disoriented and wobbly. He felt he could not keep his balance and that he was weaker on his right side compared to his left. He felt more weak in his RUE than his RLE and did endorse some right-sided numbness, as well. At the onset of these symptoms, he endorsed sharp left-sided chest pain that resolved on its own after several hours and denied headaches, dizziness, palpitations, shortness of breath, or dysuria. His symptoms are somewhat improved compared to onset but persistent.  He denies a known history of atrial fibrillation. He had a stroke in 2014, during which he experienced left sided facial droop and right sided weakness. His facial droop had resolved but he did endorse some residual right sided weakness. Prior to today, he denies any history of headaches, N/V, or seizures.  He denies tobacco use, alcohol use, or other illicit drugs. At baseline, he is ambulatory without assistance and is compliant with his home medications.   ED Course: - BP 181/93, temp 97.7, RR 17, RR 73, O2 97% on  RA - CBC, CMP wnl. INR normal at 0.98. Troponin negative - CT head shows no acute intracranial pathology. CT-A head/neck was negative for emergent large vessel occlusion, but did show left M1 segment stenosis, 60% bilateral ICA narrowing, and 4.3x1.3cm presumed meningioma along left frontal convexity. MRI brain confirms multiple cortical acute infarctions along left cerebral convexity, as well as the presumed meningioma. - Received amlodipine and carvedilol x1 - Evaluated by Neuro. Not a candidate for IV tPA due to presenting outside of the window  Meds:  Current Meds  Medication Sig  . amLODipine (NORVASC) 10 MG tablet Take 10 mg by mouth daily.    . APIDRA SOLOSTAR 100 UNIT/ML Solostar Pen INJECT 30-36 UNITS         SUBCUTANEOUSLY 3 TIMES A   DAY WITH MEALS  . aspirin 325 MG tablet Take 325 mg by mouth daily.  Marland Kitchen atorvastatin (LIPITOR) 80 MG tablet Take 1 tablet (80 mg total) by mouth every evening.  . benazepril (LOTENSIN) 40 MG tablet Take 40 mg by mouth 2 (two) times daily.   . Insulin Detemir (LEVEMIR FLEXTOUCH) 100 UNIT/ML Pen INJECT 100 UNITS SUBCUTANEOUSLY DAILY AT  10PM  . isosorbide mononitrate (IMDUR) 30 MG 24 hr tablet TAKE 1 TABLET BY MOUTH DAILY  . metFORMIN (GLUCOPHAGE) 1000 MG tablet Take 500 mg by mouth 2 (two) times  daily with a meal.  . nitroGLYCERIN (NITROSTAT) 0.4 MG SL tablet Place 1 tablet (0.4 mg total) under the tongue every 5 (five) minutes as needed.  . [DISCONTINUED] carvedilol (COREG) 25 MG tablet TAKE 1 TABLET (25 MG TOTAL) BY MOUTH 2 (TWO) TIMES DAILY.   Allergies: Allergies as of 07/31/2017 - Review Complete 07/31/2017  Allergen Reaction Noted  . Shrimp [shellfish allergy]  09/22/2014   Past Medical History:  Diagnosis Date  . CAD (coronary artery disease)    nonobstructive CAD/patent LAD stent site; EF 65%, 08/2011; NSTEMI/DES LAD, 6/20 11  . Chest pain   . Diabetes mellitus    insulin-dependent  . Dyslipidemia   . HTN (hypertension)   . Post MI  syndrome ([place]) 08-29-2009  . Stroke [facility]) 10/22/2012   Left inferior pons   Family History:  Family History  Problem Relation Age of Onset  . Hypertension Other        in all family members   Social History:  - denies alcohol use, tobacco use, or other illicit drug use - ambulatory without assistance and is compliant with his home medications - lives at home with his wife  Review of Systems: Constitutional: Negative for fever and weight loss. Positive for disorientation. HEENT: Negative for hearing loss and congestion. Positive for dysarthria. Respiratory: Negative for cough, shortness of breath and wheezing.   Cardiovascular: Negative for palpitations and leg swelling. Positive for sharp left-sided chest pain x1 Gastrointestinal: Negative for abdominal pain, nausea and vomiting.  Genitourinary: Negative for dysuria.  Musculoskeletal: Negative for joint pain. Neurological: Negative for headaches. Positive for right sided weakness and numbness Skin: Negative for rashes Psych: Negative for depressed mood  Physical Exam: Blood pressure (!) 180/87, pulse 71, temperature 98.6 F (37 C), temperature source Oral, resp. rate 18, height 6\' 3"  (1.905 m), weight 264 lb 8.8 oz (120 kg), SpO2 98 %. GEN: Well-nourished obese male lying in bed. Alert and oriented x3. No acute distress. HENT: [place]/AT. Moist mucous membranes. No visible lesions. EYES: PERRL. Sclera non-icteric. Conjunctiva clear. RESP: Clear to auscultation bilaterally. No wheezes, rales, or rhonchi. No increased work of breathing. CV: Normal rate and regular rhythm. No murmurs, gallops, or rubs. No carotid bruits. No LE edema. ABD: Soft. Obese. Non-tender. Non-distended. Normoactive bowel sounds. EXT: No edema. Warm and well perfused. NEURO: A&Ox3 - CN: Mild left-sided lower facial droop with smiling. EOMI intact. PERRL. Normal facial sensation bilaterally. Normal hearing. Normal SCM strength. No tongue or uvula deviation. -  STRENGTH: 4/5 RUE strength, 5/5 RLE. 5/5 LUE and LLE. - SENSATION: Sensation intact to light touch bilaterally in UE and LE.  - SPEECH: Mild dysarthria. Naming intact. - GAIT: Deferred - COORDINATION: RUE dysmetria with FNF. No dysmetria of bilateral heel-to-shin PSYCH: Patient is calm and pleasant. Appropriate affect. Well-groomed; speech is on-subject.  Labs CBC Latest Ref Rng & Units 07/31/2017 07/31/2017 09/22/2014  WBC 4.0 - 10.5 K/uL - 6.0 10.4  Hemoglobin 13.0 - 17.0 g/dL 15.3 14.2 16.3  Hematocrit 39.0 - 52.0 % 45.0 45.0 48.9  Platelets 150 - 400 K/uL - 221 242   CMP Latest Ref Rng & Units 08/01/2017 07/31/2017 07/31/2017  Glucose 65 - 99 mg/dL 216(H) 193(H) 197(H)  BUN 6 - 20 mg/dL 11 15 14   Creatinine 0.61 - 1.24 mg/dL 0.90 0.90 1.12  Sodium 135 - 145 mmol/L 141 143 141  Potassium 3.5 - 5.1 mmol/L 3.8 4.3 4.5  Chloride 101 - 111 mmol/L 105 104 105  CO2 22 - 32  mmol/L 25 - 26  Calcium 8.9 - 10.3 mg/dL 9.2 - 9.3  Total Protein 6.5 - 8.1 g/dL - - 7.9  Total Bilirubin 0.3 - 1.2 mg/dL - - 1.0  Alkaline Phos 38 - 126 U/L - - 72  AST 15 - 41 U/L - - 32  ALT 17 - 63 U/L - - 56   Troponin negative INR 0.98  EKG: personally reviewed my interpretation is NSR, normal intervals, normal axis, no ST or T wave changes  CT head 1. No acute intracranial pathology seen on CT. 2. Mild cortical volume loss and scattered small vessel ischemic microangiopathy. 3. Opacification of the right side of the sphenoid sinus and partial opacification of the right frontal sinus.  CT-A head/neck 1. Negative for emergent large vessel occlusion. 2. Moderate to advanced left M1 segment stenosis due to eccentric filling defect, possibly symptomatic in this setting. 3. ~60% bilateral cavernous ICA narrowing due to calcified plaque. 4. No flow limiting stenosis in the neck. 5. Presumed meningioma along the left frontal convexity measuring up to 4.3 x 1.3 cm. 6. Left nasal cavity polyp herniating into the  nasopharynx. Patchy bilateral sinusitis.  MRI brain 1. Scattered, primarily cortical acute infarction along the left cerebral convexity, posterior watershed pattern. 2. Presumed meningioma along the lateral left frontal convexity measuring 4.1 x 1.4 cm. No neighboring brain edema. 3. Multiple small remote cerebellar infarcts. 4. Chronic sinusitis.  Left nasal cavity and nasopharynx polyp.  Assessment & Plan by Problem: Active Problems:   Cerebral embolism with cerebral infarction  [name] is a 67yo male with PMH of L pontine stroke in 2014 (with mild residual RUE weakness), CAD s/p DES to LAD in 2011, diabetes, and HTN who presents with right-sided weakness, left facial droop, and dysarthria since 6pm on 5/18, found to have multiple primarily cortical acute infarctions along left cerebral convexity, as well as left M1 segment stenosis and 60% bilateral ICA narrowing. MRI also shows evidence of new presumed 4.3x1.3cm meningioma along left frontal convexity. Neurology has been consulted and no tPA was administered due to being outside of the window. His symptoms are mildly improved. Etiology for his symptoms is most likely related to multiple left-sided cortical acute infarctions, which may be a result of thromboembolic disease.  Multiple left cortical acute infarctions Left M1 segment stenosis 60% bilateral cavernous ICA narrowing Possibly thromboembolic disease. Patient reports mild improvement in his symptoms since onset. Current deficits include mild dysarthria, left lower facial weakness, RUE weakness, and right-sided dysmetria. Will continue further work-up and Stroke team will re-evaluate in the morning.  - Neurology consulted; appreciate their assistance - Admit to telemetry - Allow for permissive HTN (systolic < 194 and diastolic < 174) - Echocardiogram - Lipid panel - ASA 325 mg daily, may benefit from DAPT or switch to Plavix - Continue home atorvastatin 80mg  QHS - SLP eval -  PT/OT eval - BMP in AM  New left frontal meningioma Incidentally noted measuring 4.3x1.3cm on MRI brain without surrounding edema, which is new compared to most recent MRI scan available in 2015. New mass is felt to represent a presumed meningioma. No history of seizures or other symptoms suggestive of intracranial mass effect, and etiology of his current symptoms is most likely related to multiple areas of infarction rather than the newly identified mass. Will follow-up with Neurology while inpatient to ensure that no further diagnostic work-up is needed. - Neurology consulted; appreciate their assistance - Continue to monitor - Will need Outpatient Neurology follow-up  HTN Hx of CAD s/p DES to LAD in 2011 Home medications include amlodipine 10mg  daily, benazepril 40mg  BID, carvedilol 25mg  BID, and imdur 30mg  daily.  - Continue home aspirin 325mg  daily - Allowing for permissive HTN in setting of CVA - Hold home amlodipine, benazepril, carvedilol, and imdur  Diabetes Home medications include metformin 500mg  BID, Levemir 100 units QHS, and Apidra 30-36u TID with meals. Recent A1c 5/2 was 9.9. - Levemir 50 units QHS while NPO - SSI-M TID q4h while NPO - CBG monitoring q4h while NPO - Can restart meal-time Novolog when eating  Diet: NPO until SLP eval VTE PPx: Lovenox Code Status: Full code Dispo: Admit patient to Observation with expected length of stay less than 2 midnights.  Signed: Colbert Ewing, MD 08/01/2017, 2:29 PM  Pager: Mamie Nick [phone]

## 2017-07-31 NOTE — ED Notes (Signed)
Patient returned from MRI and placed back on monitor

## 2017-07-31 NOTE — ED Notes (Signed)
Attempted report 

## 2017-07-31 NOTE — ED Provider Notes (Signed)
MSE was initiated and I personally evaluated the patient and placed orders (if any) at  05:50 AM on Jul 31, 2017.  The patient appears stable so that the remainder of the MSE may be completed by another provider.  Chief Complaint: Stroke symptoms  HPI:   He reports last night around 6 PM he began to feel off balance.  He reports he was able to walk but it was more difficult than usual.  He states at 9 PM his right arm began to feel weak.  He states he discussed this with his wife and then went to bed.  When he awoke this morning symptoms persisted opting his visit to the emergency department.  Patient's wife is at bedside and verifies that he was well at 6 PM.  ROS: Gait disturbance, right arm weakness  Physical Exam:   Gen: No distress  Neuro: Awake and Alert  Skin: Warm    Focused Exam: Mental Status:  Alert, oriented, thought content appropriate, able to give a coherent history.  No aphasia.  Some slurred speech. Able to follow 2 step commands without difficulty.  Cranial Nerves:  II:  Peripheral visual fields grossly normal, pupils equal, round, reactive to light III,IV, VI: ptosis not present, extra-ocular motions intact bilaterally  V,VII: Left-sided facial droop, facial light touch sensation equal VIII: hearing grossly normal to voice  X: uvula elevates symmetrically  XI: bilateral shoulder shrug symmetric and strong XII: midline tongue extension without fassiculations Motor:  Normal tone. No arm drift or leg drift 4/5 in the right upper extremity 5/5 strength in the left upper extremity 5/5 strength in the bilateral lower extremities Sensory: Light touch normal in all extremities.  Deep Tendon Reflexes: 2+ and symmetric in the biceps and patella Cerebellar: Dysmetria noted with finger-to-nose with right upper extremity.  Normal finger-to-nose with left upper extremity.  Normal heel knee shin bilaterally. Gait: Gait testing deferred CV: distal pulses palpable throughout    Patient's symptoms are greater than 4 hours and he is VAN negative.  Stroke orders placed.  Code stroke not initiated at this time.  Patient will be followed by oncoming provider who will consult with neurology.   Enis Riecke, Gwenlyn Perking 07/31/17 [id number]    Ripley Fraise, MD 07/31/17 [phone]

## 2017-07-31 NOTE — ED Triage Notes (Addendum)
Patient stated that he lost control of his right arm last night around 6pm, he states that there was some numbness in it.  He states that it is still continuing.  Patient with droop on right side of face.  He does have weakness in right arm and right leg, his gait coming into ED was not steady.  Patient is CAOx4, no slurred speech.  He feels as if his balance is off.  He states that he is also having some chest pain, center of his chest, sharp in nature.

## 2017-08-01 ENCOUNTER — Observation Stay (HOSPITAL_COMMUNITY): Payer: Medicare Other

## 2017-08-01 DIAGNOSIS — R29810 Facial weakness: Secondary | ICD-10-CM

## 2017-08-01 DIAGNOSIS — I252 Old myocardial infarction: Secondary | ICD-10-CM | POA: Diagnosis not present

## 2017-08-01 DIAGNOSIS — E119 Type 2 diabetes mellitus without complications: Secondary | ICD-10-CM | POA: Diagnosis not present

## 2017-08-01 DIAGNOSIS — I639 Cerebral infarction, unspecified: Secondary | ICD-10-CM

## 2017-08-01 DIAGNOSIS — D320 Benign neoplasm of cerebral meninges: Secondary | ICD-10-CM | POA: Diagnosis not present

## 2017-08-01 DIAGNOSIS — Z7982 Long term (current) use of aspirin: Secondary | ICD-10-CM | POA: Diagnosis not present

## 2017-08-01 DIAGNOSIS — Z8249 Family history of ischemic heart disease and other diseases of the circulatory system: Secondary | ICD-10-CM | POA: Diagnosis not present

## 2017-08-01 DIAGNOSIS — Z79899 Other long term (current) drug therapy: Secondary | ICD-10-CM

## 2017-08-01 DIAGNOSIS — I63412 Cerebral infarction due to embolism of left middle cerebral artery: Secondary | ICD-10-CM | POA: Diagnosis not present

## 2017-08-01 DIAGNOSIS — R4701 Aphasia: Secondary | ICD-10-CM

## 2017-08-01 DIAGNOSIS — R471 Dysarthria and anarthria: Secondary | ICD-10-CM | POA: Diagnosis present

## 2017-08-01 DIAGNOSIS — I10 Essential (primary) hypertension: Secondary | ICD-10-CM | POA: Diagnosis not present

## 2017-08-01 DIAGNOSIS — Z794 Long term (current) use of insulin: Secondary | ICD-10-CM

## 2017-08-01 DIAGNOSIS — J329 Chronic sinusitis, unspecified: Secondary | ICD-10-CM | POA: Diagnosis present

## 2017-08-01 DIAGNOSIS — I25118 Atherosclerotic heart disease of native coronary artery with other forms of angina pectoris: Secondary | ICD-10-CM | POA: Diagnosis not present

## 2017-08-01 DIAGNOSIS — R29703 NIHSS score 3: Secondary | ICD-10-CM | POA: Diagnosis present

## 2017-08-01 DIAGNOSIS — R4189 Other symptoms and signs involving cognitive functions and awareness: Secondary | ICD-10-CM | POA: Diagnosis not present

## 2017-08-01 DIAGNOSIS — R0602 Shortness of breath: Secondary | ICD-10-CM | POA: Diagnosis not present

## 2017-08-01 DIAGNOSIS — I6930 Unspecified sequelae of cerebral infarction: Secondary | ICD-10-CM

## 2017-08-01 DIAGNOSIS — E1151 Type 2 diabetes mellitus with diabetic peripheral angiopathy without gangrene: Secondary | ICD-10-CM | POA: Diagnosis present

## 2017-08-01 DIAGNOSIS — Z955 Presence of coronary angioplasty implant and graft: Secondary | ICD-10-CM

## 2017-08-01 DIAGNOSIS — I6523 Occlusion and stenosis of bilateral carotid arteries: Secondary | ICD-10-CM | POA: Diagnosis present

## 2017-08-01 DIAGNOSIS — E669 Obesity, unspecified: Secondary | ICD-10-CM

## 2017-08-01 DIAGNOSIS — Z87891 Personal history of nicotine dependence: Secondary | ICD-10-CM | POA: Diagnosis not present

## 2017-08-01 DIAGNOSIS — E1165 Type 2 diabetes mellitus with hyperglycemia: Secondary | ICD-10-CM | POA: Diagnosis present

## 2017-08-01 DIAGNOSIS — E1169 Type 2 diabetes mellitus with other specified complication: Secondary | ICD-10-CM | POA: Diagnosis not present

## 2017-08-01 DIAGNOSIS — R2990 Unspecified symptoms and signs involving the nervous system: Secondary | ICD-10-CM | POA: Diagnosis not present

## 2017-08-01 DIAGNOSIS — E785 Hyperlipidemia, unspecified: Secondary | ICD-10-CM | POA: Diagnosis not present

## 2017-08-01 DIAGNOSIS — E7800 Pure hypercholesterolemia, unspecified: Secondary | ICD-10-CM | POA: Diagnosis not present

## 2017-08-01 DIAGNOSIS — G8321 Monoplegia of upper limb affecting right dominant side: Secondary | ICD-10-CM | POA: Diagnosis not present

## 2017-08-01 DIAGNOSIS — Z6833 Body mass index (BMI) 33.0-33.9, adult: Secondary | ICD-10-CM | POA: Diagnosis not present

## 2017-08-01 DIAGNOSIS — I69351 Hemiplegia and hemiparesis following cerebral infarction affecting right dominant side: Secondary | ICD-10-CM

## 2017-08-01 DIAGNOSIS — I2510 Atherosclerotic heart disease of native coronary artery without angina pectoris: Secondary | ICD-10-CM

## 2017-08-01 DIAGNOSIS — I6340 Cerebral infarction due to embolism of unspecified cerebral artery: Secondary | ICD-10-CM

## 2017-08-01 DIAGNOSIS — I69331 Monoplegia of upper limb following cerebral infarction affecting right dominant side: Secondary | ICD-10-CM | POA: Diagnosis not present

## 2017-08-01 DIAGNOSIS — J330 Polyp of nasal cavity: Secondary | ICD-10-CM | POA: Diagnosis present

## 2017-08-01 DIAGNOSIS — G8191 Hemiplegia, unspecified affecting right dominant side: Secondary | ICD-10-CM | POA: Diagnosis present

## 2017-08-01 DIAGNOSIS — G8101 Flaccid hemiplegia affecting right dominant side: Secondary | ICD-10-CM | POA: Diagnosis not present

## 2017-08-01 DIAGNOSIS — I6349 Cerebral infarction due to embolism of other cerebral artery: Secondary | ICD-10-CM | POA: Diagnosis not present

## 2017-08-01 DIAGNOSIS — I63512 Cerebral infarction due to unspecified occlusion or stenosis of left middle cerebral artery: Secondary | ICD-10-CM | POA: Diagnosis not present

## 2017-08-01 HISTORY — DX: Cerebral infarction due to embolism of unspecified cerebral artery: I63.40

## 2017-08-01 HISTORY — DX: Cerebral infarction, unspecified: I63.9

## 2017-08-01 LAB — BASIC METABOLIC PANEL
Anion gap: 11 (ref 5–15)
BUN: 11 mg/dL (ref 6–20)
CALCIUM: 9.2 mg/dL (ref 8.9–10.3)
CO2: 25 mmol/L (ref 22–32)
CREATININE: 0.9 mg/dL (ref 0.61–1.24)
Chloride: 105 mmol/L (ref 101–111)
GFR calc non Af Amer: >60 mL/min (ref >60)
Glucose, Bld: 216 mg/dL — ABNORMAL HIGH (ref 65–99)
Potassium: 3.8 mmol/L (ref 3.5–5.1)
Sodium: 141 mmol/L (ref 135–145)

## 2017-08-01 LAB — GLUCOSE, CAPILLARY
GLUCOSE-CAPILLARY: 238 mg/dL — AB (ref 65–99)
GLUCOSE-CAPILLARY: 247 mg/dL — AB (ref 65–99)
GLUCOSE-CAPILLARY: 389 mg/dL — AB (ref 65–99)
Glucose-Capillary: 185 mg/dL — ABNORMAL HIGH (ref 65–99)
Glucose-Capillary: 246 mg/dL — ABNORMAL HIGH (ref 65–99)
Glucose-Capillary: 223 mg/dL — ABNORMAL HIGH (ref 65–99)

## 2017-08-01 LAB — LIPID PANEL
Cholesterol: 138 mg/dL (ref 0–200)
HDL: 31 mg/dL — AB (ref >40)
LDL Cholesterol: 87 mg/dL (ref 0–99)
Total CHOL/HDL Ratio: 4.5 RATIO
Triglycerides: 98 mg/dL (ref <150)
VLDL: 20 mg/dL (ref 0–40)

## 2017-08-01 LAB — ECHOCARDIOGRAM COMPLETE
HEIGHTINCHES: 75 in
Weight: 4232.832 oz

## 2017-08-01 MED ORDER — CLOPIDOGREL BISULFATE 75 MG PO TABS
75.0000 mg | ORAL_TABLET | Freq: Every day | ORAL | Status: DC
  Administered 2017-08-01 – 2017-08-03 (×3): 75 mg via ORAL
  Filled 2017-08-01 (×3): qty 1

## 2017-08-01 MED ORDER — INSULIN ASPART 100 UNIT/ML ~~LOC~~ SOLN: 0.0000 [IU] | Freq: Every day | SUBCUTANEOUS | Status: DC

## 2017-08-01 MED ORDER — INSULIN ASPART 100 UNIT/ML ~~LOC~~ SOLN
0.0000 [IU] | Freq: Three times a day (TID) | SUBCUTANEOUS | Status: DC
  Administered 2017-08-01: 15 [IU] via SUBCUTANEOUS
  Administered 2017-08-01: 5 [IU] via SUBCUTANEOUS
  Administered 2017-08-02: 3 [IU] via SUBCUTANEOUS
  Administered 2017-08-02: 5 [IU] via SUBCUTANEOUS
  Administered 2017-08-02 – 2017-08-03 (×2): 3 [IU] via SUBCUTANEOUS
  Administered 2017-08-03: 11 [IU] via SUBCUTANEOUS
  Administered 2017-08-03: 5 [IU] via SUBCUTANEOUS

## 2017-08-01 MED ORDER — ASPIRIN EC 81 MG PO TBEC
81.0000 mg | DELAYED_RELEASE_TABLET | Freq: Every day | ORAL | Status: DC
  Administered 2017-08-01 – 2017-08-02 (×2): 81 mg via ORAL
  Filled 2017-08-01 (×2): qty 1

## 2017-08-01 MED ORDER — INSULIN ASPART 100 UNIT/ML ~~LOC~~ SOLN
15.0000 [IU] | Freq: Three times a day (TID) | SUBCUTANEOUS | Status: DC
  Administered 2017-08-01 – 2017-08-02 (×4): 15 [IU] via SUBCUTANEOUS

## 2017-08-01 MED ORDER — INSULIN DETEMIR 100 UNIT/ML ~~LOC~~ SOLN
75.0000 [IU] | Freq: Every day | SUBCUTANEOUS | Status: DC
  Administered 2017-08-01 – 2017-08-02 (×2): 75 [IU] via SUBCUTANEOUS
  Filled 2017-08-01 (×3): qty 0.75

## 2017-08-01 NOTE — Evaluation (Signed)
Speech Language Pathology Evaluation Patient Details Name: Alex Cortez MRN: [id number] DOB: 08-08-1950 Today's Date: 08/01/2017 Time: 4098-1191 SLP Time Calculation (min) (ACUTE ONLY): 26 min  Problem List:  Patient Active Problem List   Diagnosis Date Noted  . Cerebral embolism with cerebral infarction 08/01/2017  . Class 1 obesity due to excess calories with serious comorbidity and body mass index (BMI) of 32.0 to 32.9 in adult 09/09/2015  . Sedentary lifestyle 09/09/2015  . Mixed hyperlipidemia 02/24/2015  . Essential hypertension, benign 02/24/2015  . Exertional dyspnea 10/18/2012  . Diastolic heart failure ([place]) 10/01/2011  . Obstructive sleep apnea 09/02/2011  . LV dysfunction 09/02/2011  . ERECTILE DYSFUNCTION, SECONDARY TO MEDICATION 04/14/2010  . Type 2 diabetes mellitus with vascular disease ([place]) 10/02/2009  . ACUT MI SUBENDOCARDIAL INFARCT INIT EPIS CARE 10/02/2009  . CORONARY ATHEROSCLEROSIS NATIVE CORONARY ARTERY 10/02/2009  . CHEST PAIN 10/02/2009   Past Medical History:  Past Medical History:  Diagnosis Date  . CAD (coronary artery disease)    nonobstructive CAD/patent LAD stent site; EF 65%, 08/2011; NSTEMI/DES LAD, 6/20 11  . Chest pain   . Diabetes mellitus    insulin-dependent  . Dyslipidemia   . HTN (hypertension)   . Post MI syndrome ([place]) 08-29-2009  . Stroke [facility]) 10/22/2012   Left inferior pons   Past Surgical History:  Past Surgical History:  Procedure Laterality Date  . CORONARY ANGIOPLASTY WITH STENT PLACEMENT     HPI:  67 y.o. male past medical history of a left pontine stroke with no residual deficits, coronary artery disease, poorly controlled diabetes, hypertension, hyperlipidemia, presented to the emergency room for evaluation of right-sided weakness that started around 6 PM on 07/30/2017. imaging revealed Scattered, primarily cortical acute infarction along the left cerebral convexity, posterior watershed pattern, Presumed meningioma  along the lateral left frontal convexity measuring 4.1 x 1.4 cm   Assessment / Plan / Recommendation Clinical Impression  Patient sitting up in bed and using cell phone when entering room. His wife was at bedside and able to provide a history as well as baseline. The MOCA was administered and score of 18/30. Deficits in the area of memory and delayed recall impacted by poor attention. Patient also exhibits mild to moderate expressive aphasia noticable in conversation. Upon discharge wife is able to provide supervision. Recommend IRC evaluation.      SLP Assessment  SLP Recommendation/Assessment: Patient needs continued Speech Lanaguage Pathology Services SLP Visit Diagnosis: Aphasia (R47.01);Attention and concentration deficit Attention and concentration deficit following: Cerebral infarction    Follow Up Recommendations  Inpatient Rehab    Frequency and Duration min 2x/week  2 weeks      SLP Evaluation Cognition  Overall Cognitive Status: Impaired/Different from baseline Arousal/Alertness: Awake/alert Orientation Level: Oriented X4 Attention: Focused Focused Attention: Appears intact Memory: Impaired Memory Impairment: Decreased recall of new information Awareness: Impaired Awareness Impairment: Anticipatory impairment Problem Solving: Impaired Problem Solving Impairment: Verbal complex;Functional complex Executive Function: Organizing;Decision Database administrator: Impaired Organizing Impairment: Verbal complex Decision Making: Impaired Decision Making Impairment: Verbal complex;Functional complex Self Monitoring: Impaired Self Monitoring Impairment: Verbal complex Self Correcting: Impaired Self Correcting Impairment: Verbal complex Safety/Judgment: Impaired       Comprehension  Auditory Comprehension Overall Auditory Comprehension: Appears within functional limits for tasks assessed Yes/No Questions: Within Functional Limits Commands:  Within Functional Limits Conversation: Simple Interfering Components: Attention EffectiveTechniques: Visual/Gestural cues;Extra processing time;Repetition Visual Recognition/Discrimination Discrimination: Exceptions to [facility] Reading Comprehension Reading Status: Within funtional limits    Expression Expression  Primary Mode of Expression: Verbal Verbal Expression Overall Verbal Expression: Impaired Initiation: No impairment Level of Generative/Spontaneous Verbalization: Sentence Repetition: No impairment Naming: No impairment Pragmatics: No impairment Written Expression Dominant Hand: Right Written Expression: Exceptions to WFL(Patient has right hand weakness from stroke.)   Oral / Motor  Oral Motor/Sensory Function Overall Oral Motor/Sensory Function: Within functional limits Motor Speech Overall Motor Speech: Appears within functional limits for tasks assessed Respiration: Within functional limits Phonation: Normal Resonance: Within functional limits Articulation: Within functional limitis Intelligibility: Intelligible Motor Planning: Witnin functional limits Motor Speech Errors: Not applicable   GO                   Charlynne Cousins Cyanna Neace, MA, CCC-SLP 08/01/2017 9:56 AM

## 2017-08-01 NOTE — Plan of Care (Signed)
Patient is still progressing with orientation per notes from PT and assessment by RN. Pt is still experiencing weakness in right arm. Will continue to monitor.

## 2017-08-01 NOTE — Progress Notes (Addendum)
STROKE TEAM PROGRESS NOTE   INTERVAL HISTORY His wife  is at the bedside.  He is upset that his arm does not work well. Says it is the same as yesterday, not worse. OP OT recommended. No PT. Discussed mult stroke risk factors and risk of stroke and treatment to avoid recurrence.  Vitals:   07/31/17 2335 08/01/17 0341 08/01/17 0748 08/01/17 1131  BP: (!) 162/86 (!) 167/89 (!) 159/79 (!) 180/87  Pulse: 71 69 68 71  Resp: 17 18 18 18   Temp: 98.3 F (36.8 C) 98.6 F (37 C) 97.6 F (36.4 C) 98.6 F (37 C)  TempSrc: Oral Oral Oral Oral  SpO2: 96% 95% 97% 98%  Weight:      Height:        CBC:  Recent Labs  Lab 07/31/17 0555 07/31/17 0601  WBC 6.0  --   NEUTROABS 3.1  --   HGB 14.2 15.3  HCT 45.0 45.0  MCV 83.2  --   PLT 221  --     Basic Metabolic Panel:  Recent Labs  Lab 07/31/17 0555 07/31/17 0601 08/01/17 0243  NA 141 143 141  K 4.5 4.3 3.8  CL 105 104 105  CO2 26  --  25  GLUCOSE 197* 193* 216*  BUN 14 15 11   CREATININE 1.12 0.90 0.90  CALCIUM 9.3  --  9.2   Lipid Panel:     Component Value Date/Time   CHOL 138 08/01/2017 0243   TRIG 98 08/01/2017 0243   HDL 31 (L) 08/01/2017 0243   CHOLHDL 4.5 08/01/2017 0243   VLDL 20 08/01/2017 0243   LDLCALC 87 08/01/2017 0243   HgbA1c:  Lab Results  Component Value Date   HGBA1C 9.9 (H) 07/12/2017   Urine Drug Screen: No results found for: LABOPIA, COCAINSCRNUR, LABBENZ, AMPHETMU, THCU, LABBARB  Alcohol Level No results found for: ETH  IMAGING Ct Angio Head W Or Wo Contrast  Result Date: 07/31/2017 CLINICAL DATA:  Right-sided weakness EXAM: CT ANGIOGRAPHY HEAD AND NECK TECHNIQUE: Multidetector CT imaging of the head and neck was performed using the standard protocol during bolus administration of intravenous contrast. Multiplanar CT image reconstructions and MIPs were obtained to evaluate the vascular anatomy. Carotid stenosis measurements (when applicable) are obtained utilizing NASCET criteria, using the  distal internal carotid diameter as the denominator. CONTRAST:  56mL ISOVUE-370 IOPAMIDOL (ISOVUE-370) INJECTION 76% COMPARISON:  Head CT from earlier today. Brain MRI report from 04/20/2013 FINDINGS: CTA NECK FINDINGS Aortic arch: Mild atherosclerotic plaque.  Two vessel branching. Right carotid system: Moderate mainly calcified plaque at the common carotid bifurcation. No flow limiting stenosis or ulceration. Left carotid system: Mild calcified plaque along the common carotid and ICA bulb. No flow limiting stenosis or ulceration. Vertebral arteries: No proximal subclavian stenosis. Left vertebral artery dominance. Intermittent mild irregularity of the right vertebral artery on reformats is is likely artifactual, accentuated by small vessel size. There is a small calcified plaque at the right vertebral origin without suspected flow limiting stenosis. Skeleton: Diffuse degenerative disc narrowing and endplate ridging. Other neck: Posterior left nasal cavity polyp extending into the nasopharynx where it measures up to 2 cm on axial slices. Patchy opacification of the paranasal sinuses. Upper chest: Probable coronary atherosclerotic calcification on the left. Review of the MIP images confirms the above findings CTA HEAD FINDINGS Anterior circulation: Carotid siphon atherosclerotic plaque with bilateral stenosis. On coronal reformats stenosis at the bilateral mid cavernous segment measures ~ 60%. No large vessel occlusion. Best seen  on coronal postcontrast imaging there is a focal narrowing of the distal left M1 segment from superior wall eccentric defect. This could be plaque or plaque with thrombus. Posterior circulation: Left dominant. The right vertebral artery ends in PICA. Mild-to-moderate narrowing of the left distal P2 segment. No branch occlusion or flow limiting stenosis. Venous sinuses: Patent Anatomic variants: None significant Delayed phase: Enhancing extra-axial mass along the left frontal operculum  measuring up to 4.3 cm in diameter and 13 mm in thickness. No underlying brain edema seen. Review of the MIP images confirms the above findings IMPRESSION: 1. Negative for emergent large vessel occlusion. 2. Moderate to advanced left M1 segment stenosis due to eccentric filling defect, possibly symptomatic in this setting. 3. ~60% bilateral cavernous ICA narrowing due to calcified plaque. 4. No flow limiting stenosis in the neck. 5. Presumed meningioma along the left frontal convexity measuring up to 4.3 x 1.3 cm. 6. Left nasal cavity polyp herniating into the nasopharynx. Patchy bilateral sinusitis. Electronically Signed   By: Monte Fantasia M.D.   On: 07/31/2017 09:07   Ct Head Wo Contrast  Result Date: 07/31/2017 CLINICAL DATA:  Acute onset of right arm numbness and right-sided facial droop. Right-sided weakness. EXAM: CT HEAD WITHOUT CONTRAST TECHNIQUE: Contiguous axial images were obtained from the base of the skull through the vertex without intravenous contrast. COMPARISON:  None. FINDINGS: Brain: No evidence of acute infarction, hemorrhage, hydrocephalus, extra-axial collection or mass lesion / mass effect. Prominence of the ventricles and sulci reflects mild cortical volume loss. Mild cerebellar atrophy is noted. Mild periventricular white matter change likely reflects small vessel ischemic microangiopathy. The brainstem and fourth ventricle are within normal limits. The basal ganglia are unremarkable in appearance. The cerebral hemispheres demonstrate grossly normal gray-white differentiation. No mass effect or midline shift is seen. Vascular: No hyperdense vessel or unexpected calcification. Skull: There is no evidence of fracture; visualized osseous structures are unremarkable in appearance. Sinuses/Orbits: The visualized portions of the orbits are within normal limits. There is opacification of the right side of the sphenoid sinus and partial opacification of the right frontal sinus. The remaining  paranasal sinuses and mastoid air cells are well-aerated. Other: No significant soft tissue abnormalities are seen. IMPRESSION: 1. No acute intracranial pathology seen on CT. 2. Mild cortical volume loss and scattered small vessel ischemic microangiopathy. 3. Opacification of the right side of the sphenoid sinus and partial opacification of the right frontal sinus. Electronically Signed   By: Garald Balding M.D.   On: 07/31/2017 06:41   Ct Angio Neck W And/or Wo Contrast  Result Date: 07/31/2017 CLINICAL DATA:  Right-sided weakness EXAM: CT ANGIOGRAPHY HEAD AND NECK TECHNIQUE: Multidetector CT imaging of the head and neck was performed using the standard protocol during bolus administration of intravenous contrast. Multiplanar CT image reconstructions and MIPs were obtained to evaluate the vascular anatomy. Carotid stenosis measurements (when applicable) are obtained utilizing NASCET criteria, using the distal internal carotid diameter as the denominator. CONTRAST:  72mL ISOVUE-370 IOPAMIDOL (ISOVUE-370) INJECTION 76% COMPARISON:  Head CT from earlier today. Brain MRI report from 04/20/2013 FINDINGS: CTA NECK FINDINGS Aortic arch: Mild atherosclerotic plaque.  Two vessel branching. Right carotid system: Moderate mainly calcified plaque at the common carotid bifurcation. No flow limiting stenosis or ulceration. Left carotid system: Mild calcified plaque along the common carotid and ICA bulb. No flow limiting stenosis or ulceration. Vertebral arteries: No proximal subclavian stenosis. Left vertebral artery dominance. Intermittent mild irregularity of the right vertebral artery on reformats is  is likely artifactual, accentuated by small vessel size. There is a small calcified plaque at the right vertebral origin without suspected flow limiting stenosis. Skeleton: Diffuse degenerative disc narrowing and endplate ridging. Other neck: Posterior left nasal cavity polyp extending into the nasopharynx where it measures  up to 2 cm on axial slices. Patchy opacification of the paranasal sinuses. Upper chest: Probable coronary atherosclerotic calcification on the left. Review of the MIP images confirms the above findings CTA HEAD FINDINGS Anterior circulation: Carotid siphon atherosclerotic plaque with bilateral stenosis. On coronal reformats stenosis at the bilateral mid cavernous segment measures ~ 60%. No large vessel occlusion. Best seen on coronal postcontrast imaging there is a focal narrowing of the distal left M1 segment from superior wall eccentric defect. This could be plaque or plaque with thrombus. Posterior circulation: Left dominant. The right vertebral artery ends in PICA. Mild-to-moderate narrowing of the left distal P2 segment. No branch occlusion or flow limiting stenosis. Venous sinuses: Patent Anatomic variants: None significant Delayed phase: Enhancing extra-axial mass along the left frontal operculum measuring up to 4.3 cm in diameter and 13 mm in thickness. No underlying brain edema seen. Review of the MIP images confirms the above findings IMPRESSION: 1. Negative for emergent large vessel occlusion. 2. Moderate to advanced left M1 segment stenosis due to eccentric filling defect, possibly symptomatic in this setting. 3. ~60% bilateral cavernous ICA narrowing due to calcified plaque. 4. No flow limiting stenosis in the neck. 5. Presumed meningioma along the left frontal convexity measuring up to 4.3 x 1.3 cm. 6. Left nasal cavity polyp herniating into the nasopharynx. Patchy bilateral sinusitis. Electronically Signed   By: Monte Fantasia M.D.   On: 07/31/2017 09:07   Mr Brain Wo Contrast  Result Date: 07/31/2017 CLINICAL DATA:  Focal neuro deficit.  Right-sided weakness EXAM: MRI HEAD WITHOUT CONTRAST TECHNIQUE: Multiplanar, multiecho pulse sequences of the brain and surrounding structures were obtained without intravenous contrast. COMPARISON:  Head CT and CTA from earlier today FINDINGS: Brain: There is  patchy acute primarily cortical infarct along the left frontal, parietal, and occipital convexities. This is aligned along the posterior watershed distribution. The largest confluent cortical infarct measures 2.5 cm in length at the parietal lobe. Numerous small remote bilateral cerebellar infarcts, more numerous on the left. No acute hemorrhage, hydrocephalus, or collection. Homogeneous T2 isointense mass along the lateral left frontal convexity measuring 4.1 x 1.4 cm, again compatible with meningioma. Cerebral volume loss. Vascular: Major flow voids are preserved. CTA performed earlier the same day. Skull and upper cervical spine: Degenerative changes in the cervical spine. No focal marrow lesion. Sinuses/Orbits: Patchy sinus opacification with probable multiple polyps or retention cysts. There is a posterior left nasal cavity polyp herniating into the nasopharynx. IMPRESSION: 1. Scattered, primarily cortical acute infarction along the left cerebral convexity, posterior watershed pattern. 2. Presumed meningioma along the lateral left frontal convexity measuring 4.1 x 1.4 cm. No neighboring brain edema. 3. Multiple small remote cerebellar infarcts. 4. Chronic sinusitis.  Left nasal cavity and nasopharynx polyp. Electronically Signed   By: Monte Fantasia M.D.   On: 07/31/2017 09:34   PHYSICAL EXAM Mental Status: AA&Ox3  Language: speech is clear. Naming, repetition, fluency, and comprehension intact though some hesitancy noted. Can follow 3-stop commands w/o difficulty.  Cranial Nerves: PERRL. EOMI, visual fields full, no facial weakness, facial sensation intact, hearing intact, tongue/uvula/soft palate midline, normal sternocleidomastoid and trapezius muscle strength. No evidence of tongue atrophy or fibrillations Motor: 4+/5 right upper extremity w/ decreased FMM,  5/5 right lower extremity, 5/5 left upper and left lower. L arm orbits the R. Tone: is normal and bulk is normal Sensation- Intact to light  touch bilaterally, no extinction Coordination: Finger-to-nose exam shows dysmetria on the right Gait- deferred  ASSESSMENT/PLAN Alex Cortez is a 67 y.o. male with history of CAD, HTN, HLD, DM, L pontine stroke presenting with R arn weajbess.   Stroke:  left watershed infarct thought to be thrombotic secondary to  L M1 large vessel disease stenosis though cannot rule out AF  Resultant R arm hemiparesis   CT head no acute pathology. Small vessel disease. Atrophy. Opacification R sphenoid and partieal R frontal sinus  CTA head & neck no ELVO. Mod L M1 stenosis. B cavernous ICA 60% stenosis. L frontal meningioma. L nasal/nasopharynx polyp. Patching B sinusitis.  MRI  Scattered cortical & subcortical L watershed infarcts. L frontal meningioma. Mult small old infarcts. Chronic sinusitis. L nasal/nasopharynx polyp  2D Echo  pending   LE doppler pending   OP 30 day monitor recommened  LDL 87  HgbA1c 9.9  Lovenox 40 mg sq daily for VTE prophylaxis  aspirin 325 mg daily prior to admission, now on aspirin 81 mg daily and clopidogrel 75 mg daily. Given large vessel intracranial atherosclerosis, patient should be treated with aspirin 81 mg and clopidogrel 75 mg orally every day x 3 months for secondary stroke prevention. After 3 months, change to plavix alone. Long-term dual antiplatelets are contraindicated due to risk for intracerebral hemorrhage.   Therapy recommendations:  OP OT, no PT, SLP  Disposition:  Return home w/ wife & OP therapies Follow up in the office in 4 weeks. Orders placed.  Hypertension  Stable . Permissive hypertension (OK if < 220/120) but gradually normalize in 5-7 days . Long-term BP goal normotensive  Hyperlipidemia  Home meds:  lipitor 80, resumed in hospital  LDL 87, goal < 70  Continue statin at discharge  Diabetes type II  HgbA1c 9.9, goal < 7.0  Poorly Controlled  SSI  CBGs  Other Stroke Risk Factors  Advanced age  Former  Cigarette smoker, quit 29 years ago  Obesity, Body mass index is 33.07 kg/m., recommend weight loss, diet and exercise as appropriate   Hx stroke/TIA - L Pontine infarct  Coronary artery disease  Other Active Problems  L frontal meningioma. Monitor as OP  Hospital day # 0  Burnetta Sabin, MSN, APRN, ANVP-BC, AGPCNP-BC Advanced Practice Stroke Nurse [place] for Schedule & Pager information 08/01/2017 2:06 PM   ATTENDING NOTE: I reviewed above note and agree with the assessment and plan. I have made any additions or clarifications directly to the above note. Pt was seen and examined.   67 year old male with history of CAD/MI status post stent in 2011, diabetes, hypertension, hyperlipidemia, obesity, as well as stroke admitted for right arm weakness, slurred speech.  He had a stroke in 10/2012 with left pontine infarct.  Carotid Doppler, TCD, TTE negative.  A1c 10.9 and LDL 120.  He was put on aspirin.  This admission CT head no acute findings.  MRI showed left frontal meningioma but also left MCA/ACA, and MCA/PCA watershed infarcts.  CTA head neck showed left M1 moderate stenosis, and bilateral siphon 60%.  His A1c on 07/12/2017 was 9.9.  LDL at this time 87.  2D echo pending.  LE venous Doppler pending.  Patient stroke likely due to large vessel atherosclerosis due to left M1 moderate stenosis in the setting  of uncontrolled diabetes and hypertension.  He stated that he had endocrinologist, and also continues glucose monitoring, however his sugar always not in good control.  Recommended close follow-up with PCP and endocrinologist for better DM control.  Recommend outpatient surgery day cardiac event monitoring to rule out A. fib.  Also, recommend aspirin 81 and Plavix for 3 weeks and then Plavix alone.  Continue Lipitor 80.  PT/OT evaluation.   Rosalin Hawking, MD PhD Stroke Neurology 08/01/2017 5:24 PM     To contact Stroke Continuity provider, please refer to  [email or web address] After hours, contact General Neurology

## 2017-08-01 NOTE — Progress Notes (Signed)
Preliminary notes--Bilateral lower extremities venous duplex exam completed. Negative for DVT.   Alex Cortez (RDMS RVT) 08/01/17 6:22 PM

## 2017-08-01 NOTE — Consult Note (Signed)
Physical Medicine and Rehabilitation Consult Reason for Consult: Right side weakness Referring Physician: Internal medicine   HPI: Alex Cortez is a 67 y.o. right-handed male with history of left pontine infarction 2014 with mild residual right upper extremity weakness, CAD, hypertension, diabetes mellitus.  Per chart review, patient, wife, patient lives with spouse.  Reported to be independent prior to admission.  Two-level home with bed and bathroom upstairs and 4 steps to entry.  Presented 07/31/2017 with right-sided weakness and slurred speech.  MRI reviewed, showing multiple left CVA.  Per report CT/MRI scattered primary cortical acute infarction along the left cerebral convexity, posterior watershed pattern.  Presumed meningioma along the lateral left frontal convexity measuring 4.1 x 1.4 cm.  No brain edema.  Multiple small remote cerebellar infarctions.  CT angiogram of head and neck with no emergent large vessel occlusion.  Echocardiogram pending.  Patient did not receive TPA.  Presently maintained on aspirin and Plavix for CVA prophylaxis.  Subcutaneous Lovenox for DVT prophylaxis.  Physical and occupational therapy evaluations completed with recommendations of physical medicine rehab consult.   Review of Systems  Constitutional: Negative for fever.  HENT: Negative for hearing loss.   Eyes: Negative for blurred vision and double vision.  Respiratory: Negative for cough and shortness of breath.   Cardiovascular: Negative for chest pain, palpitations and leg swelling.  Gastrointestinal: Positive for constipation. Negative for nausea.  Genitourinary: Positive for urgency. Negative for dysuria, flank pain and hematuria.  Musculoskeletal: Positive for myalgias.  Skin: Negative for rash.  Neurological: Positive for speech change and focal weakness.  All other systems reviewed and are negative.  Past Medical History:  Diagnosis Date  . CAD (coronary artery disease)    nonobstructive CAD/patent LAD stent site; EF 65%, 08/2011; NSTEMI/DES LAD, 6/20 11  . Chest pain   . Diabetes mellitus    insulin-dependent  . Dyslipidemia   . HTN (hypertension)   . 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 Stroke [facility]) 10/22/2012   Left inferior pons   Past Surgical History:  Procedure Laterality Date  . CORONARY ANGIOPLASTY WITH STENT PLACEMENT     Family History  Problem Relation Age of Onset  . Hypertension Other        in all family members   Social History:  reports that he quit smoking about 29 years ago. His smoking use included cigarettes. He started smoking about 62 years ago. He has a 11.50 pack-year smoking history. He has never used smokeless tobacco. He reports that he does not drink alcohol or use drugs. Allergies:  Allergies  Allergen Reactions  . Shrimp [Shellfish Allergy]     Swelling and hives    Medications Prior to Admission  Medication Sig Dispense Refill  . amLODipine (NORVASC) 10 MG tablet Take 10 mg by mouth daily.      . APIDRA SOLOSTAR 100 UNIT/ML Solostar Pen INJECT 30-36 UNITS         SUBCUTANEOUSLY 3 TIMES A   DAY WITH MEALS 75 mL 0  . aspirin 325 MG tablet Take 325 mg by mouth daily.    Marland Kitchen atorvastatin (LIPITOR) 80 MG tablet Take 1 tablet (80 mg total) by mouth every evening. 90 tablet 3  . benazepril (LOTENSIN) 40 MG tablet Take 40 mg by mouth 2 (two) times daily.     . Insulin Detemir (LEVEMIR FLEXTOUCH) 100 UNIT/ML Pen INJECT 100 UNITS SUBCUTANEOUSLY DAILY AT  10PM 75 mL 0  . isosorbide mononitrate (IMDUR) 30 MG  24 hr tablet TAKE 1 TABLET BY MOUTH DAILY 30 tablet 4  . metFORMIN (GLUCOPHAGE) 1000 MG tablet Take 500 mg by mouth 2 (two) times daily with a meal.  3  . nitroGLYCERIN (NITROSTAT) 0.4 MG SL tablet Place 1 tablet (0.4 mg total) under the tongue every 5 (five) minutes as needed. 25 tablet 3  . Continuous Blood Gluc Sensor (FREESTYLE LIBRE SENSOR SYSTEM) MISC Use one sensor every 10 days. 3 each 2  . ONE TOUCH ULTRA TEST test  strip USE TO TEST BLOOD SUGAR FOUR TIMES DAILY E11.65 150 each 5    Home: Home Living Family/patient expects to be discharged to:: Private residence Living Arrangements: Spouse/significant other Available Help at Discharge: Family Type of Home: House Home Access: Stairs to enter Technical brewer of Steps: 4 Entrance Stairs-Rails: Left Home Layout: Two level, Bed/bath upstairs Alternate Level Stairs-Number of Steps: 13 Alternate Level Stairs-Rails: Right Bathroom Shower/Tub: Chiropodist: Standard Home Equipment: None  Lives With: Spouse  Functional History: Prior Function Level of Independence: Independent Functional Status:  Mobility: Bed Mobility General bed mobility comments: received sitting EOB Transfers Overall transfer level: Needs assistance Transfers: Sit to/from Stand Sit to Stand: Supervision General transfer comment: No physical assist required, able to elevate to standing without difficulty Ambulation/Gait Ambulation/Gait assistance: Min guard, Supervision Ambulation Distance (Feet): 100 Feet Assistive device: None Gait Pattern/deviations: Step-through pattern, Drifts right/left General Gait Details: able to ambulate without overt LOB.  Gait velocity: modestly decreased  Gait velocity interpretation: 1.31 - 2.62 ft/sec, indicative of limited community ambulator    ADL: ADL Overall ADL's : Needs assistance/impaired Eating/Feeding: Supervision/ safety, Sitting Grooming: Min guard, Standing Upper Body Bathing: Min guard, Sitting Lower Body Bathing: Min guard, Sit to/from stand Upper Body Dressing : Min guard, Sitting, Set up Upper Body Dressing Details (indicate cue type and reason): to don second gown  Lower Body Dressing: Sit to/from stand, Minimal assistance Lower Body Dressing Details (indicate cue type and reason): pt requires significant time/effort to don socks sitting EOB, is able to do so given increased time/effort    Toilet Transfer: Ambulation, Regular Toilet, Min guard Toileting- Clothing Manipulation and Hygiene: Sit to/from stand, Min guard Functional mobility during ADLs: Min guard General ADL Comments: pt completing room and hallway level mobility with overall close minguard for safety without AD, requires increased time/effort for functional tasks due to RUE impairments   Cognition: Cognition Overall Cognitive Status: Impaired/Different from baseline Arousal/Alertness: Awake/alert Orientation Level: Oriented X4 Attention: Focused Focused Attention: Appears intact Memory: Impaired Memory Impairment: Decreased recall of new information Awareness: Impaired Awareness Impairment: Anticipatory impairment Problem Solving: Impaired Problem Solving Impairment: Verbal complex, Functional complex Executive Function: Organizing, Decision Making, Self Monitoring, Self Correcting Organizing: Impaired Organizing Impairment: Verbal complex Decision Making: Impaired Decision Making Impairment: Verbal complex, Functional complex Self Monitoring: Impaired Self Monitoring Impairment: Verbal complex Self Correcting: Impaired Self Correcting Impairment: Verbal complex Safety/Judgment: Impaired Cognition Arousal/Alertness: Awake/alert Behavior During Therapy: WFL for tasks assessed/performed Overall Cognitive Status: Impaired/Different from baseline Area of Impairment: Safety/judgement, Awareness, Problem solving, Memory, Attention, Following commands Current Attention Level: Selective Memory: Decreased short-term memory Following Commands: Follows one step commands consistently Safety/Judgement: Decreased awareness of safety, Decreased awareness of deficits Awareness: Emergent Problem Solving: Requires verbal cues, Requires tactile cues, Slow processing General Comments: patient with some noted confusion during session, patient with some fluctuations in recognizing right vs left. Patient's wife present  at bedside and noting inconsistent or incorrect answers during home environmental questions, some confabulation noted  Blood pressure (!) 180/87, pulse 71, temperature 98.6 F (37 C), temperature source Oral, resp. rate 18, height 6\' 3"  (1.905 m), weight 120 kg (264 lb 8.8 oz), SpO2 98 %. Physical Exam  Vitals reviewed. Constitutional: He appears well-developed.  obese  HENT:  Head: Normocephalic and atraumatic.  Eyes: EOM are normal. Right eye exhibits no discharge. Left eye exhibits no discharge.  Neck: Normal range of motion. Neck supple. No thyromegaly present.  Cardiovascular: Normal rate, regular rhythm and normal heart sounds.  Respiratory: Effort normal and breath sounds normal. No respiratory distress.  GI: Soft. Bowel sounds are normal. He exhibits no distension.  Musculoskeletal:  No edema or tenderness in extremities  Neurological: He is alert.  Follows commands Dysarthria Right facial weakness Motor: Right upper extremity: 4 -/5 proximal to distal with apraxia Right lower extremity: 4/5 proximal to distal Left upper extremity/left lower extremity: 5/5 proximal to distal Good sitting balance  Skin: Skin is warm and dry.  Psychiatric: He has a normal mood and affect. His behavior is normal.    Results for orders placed or performed during the hospital encounter of 07/31/17 (from the past 24 hour(s))  Glucose, capillary     Status: Abnormal   Collection Time: 07/31/17  4:26 PM  Result Value Ref Range   Glucose-Capillary 291 (H) 65 - 99 mg/dL   Comment 1 Notify RN    Comment 2 Document in Chart   Glucose, capillary     Status: Abnormal   Collection Time: 07/31/17 10:23 PM  Result Value Ref Range   Glucose-Capillary 275 (H) 65 - 99 mg/dL   Comment 1 Notify RN   Glucose, capillary     Status: Abnormal   Collection Time: 08/01/17 12:21 AM  Result Value Ref Range   Glucose-Capillary 247 (H) 65 - 99 mg/dL   Comment 1 Notify RN   Lipid panel     Status: Abnormal    Collection Time: 08/01/17  2:43 AM  Result Value Ref Range   Cholesterol 138 0 - 200 mg/dL   Triglycerides 98 <150 mg/dL   HDL 31 (L) >40 mg/dL   Total CHOL/HDL Ratio 4.5 RATIO   VLDL 20 0 - 40 mg/dL   LDL Cholesterol 87 0 - 99 mg/dL  Basic metabolic panel     Status: Abnormal   Collection Time: 08/01/17  2:43 AM  Result Value Ref Range   Sodium 141 135 - 145 mmol/L   Potassium 3.8 3.5 - 5.1 mmol/L   Chloride 105 101 - 111 mmol/L   CO2 25 22 - 32 mmol/L   Glucose, Bld 216 (H) 65 - 99 mg/dL   BUN 11 6 - 20 mg/dL   Creatinine, Ser 0.90 0.61 - 1.24 mg/dL   Calcium 9.2 8.9 - 10.3 mg/dL   GFR calc non Af Amer >60 >60 mL/min   GFR calc Af Amer >60 >60 mL/min   Anion gap 11 5 - 15  Glucose, capillary     Status: Abnormal   Collection Time: 08/01/17  3:39 AM  Result Value Ref Range   Glucose-Capillary 185 (H) 65 - 99 mg/dL   Comment 1 Notify RN   Glucose, capillary     Status: Abnormal   Collection Time: 08/01/17  7:46 AM  Result Value Ref Range   Glucose-Capillary 238 (H) 65 - 99 mg/dL  Glucose, capillary     Status: Abnormal   Collection Time: 08/01/17 11:30 AM  Result Value Ref Range   Glucose-Capillary 389 (H) 65 -  99 mg/dL   Ct Angio Head W Or Wo Contrast  Result Date: 07/31/2017 CLINICAL DATA:  Right-sided weakness EXAM: CT ANGIOGRAPHY HEAD AND NECK TECHNIQUE: Multidetector CT imaging of the head and neck was performed using the standard protocol during bolus administration of intravenous contrast. Multiplanar CT image reconstructions and MIPs were obtained to evaluate the vascular anatomy. Carotid stenosis measurements (when applicable) are obtained utilizing NASCET criteria, using the distal internal carotid diameter as the denominator. CONTRAST:  26mL ISOVUE-370 IOPAMIDOL (ISOVUE-370) INJECTION 76% COMPARISON:  Head CT from earlier today. Brain MRI report from 04/20/2013 FINDINGS: CTA NECK FINDINGS Aortic arch: Mild atherosclerotic plaque.  Two vessel branching. Right carotid  system: Moderate mainly calcified plaque at the common carotid bifurcation. No flow limiting stenosis or ulceration. Left carotid system: Mild calcified plaque along the common carotid and ICA bulb. No flow limiting stenosis or ulceration. Vertebral arteries: No proximal subclavian stenosis. Left vertebral artery dominance. Intermittent mild irregularity of the right vertebral artery on reformats is is likely artifactual, accentuated by small vessel size. There is a small calcified plaque at the right vertebral origin without suspected flow limiting stenosis. Skeleton: Diffuse degenerative disc narrowing and endplate ridging. Other neck: Posterior left nasal cavity polyp extending into the nasopharynx where it measures up to 2 cm on axial slices. Patchy opacification of the paranasal sinuses. Upper chest: Probable coronary atherosclerotic calcification on the left. Review of the MIP images confirms the above findings CTA HEAD FINDINGS Anterior circulation: Carotid siphon atherosclerotic plaque with bilateral stenosis. On coronal reformats stenosis at the bilateral mid cavernous segment measures ~ 60%. No large vessel occlusion. Best seen on coronal postcontrast imaging there is a focal narrowing of the distal left M1 segment from superior wall eccentric defect. This could be plaque or plaque with thrombus. Posterior circulation: Left dominant. The right vertebral artery ends in PICA. Mild-to-moderate narrowing of the left distal P2 segment. No branch occlusion or flow limiting stenosis. Venous sinuses: Patent Anatomic variants: None significant Delayed phase: Enhancing extra-axial mass along the left frontal operculum measuring up to 4.3 cm in diameter and 13 mm in thickness. No underlying brain edema seen. Review of the MIP images confirms the above findings IMPRESSION: 1. Negative for emergent large vessel occlusion. 2. Moderate to advanced left M1 segment stenosis due to eccentric filling defect, possibly  symptomatic in this setting. 3. ~60% bilateral cavernous ICA narrowing due to calcified plaque. 4. No flow limiting stenosis in the neck. 5. Presumed meningioma along the left frontal convexity measuring up to 4.3 x 1.3 cm. 6. Left nasal cavity polyp herniating into the nasopharynx. Patchy bilateral sinusitis. Electronically Signed   By: Monte Fantasia M.D.   On: 07/31/2017 09:07   Ct Head Wo Contrast  Result Date: 07/31/2017 CLINICAL DATA:  Acute onset of right arm numbness and right-sided facial droop. Right-sided weakness. EXAM: CT HEAD WITHOUT CONTRAST TECHNIQUE: Contiguous axial images were obtained from the base of the skull through the vertex without intravenous contrast. COMPARISON:  None. FINDINGS: Brain: No evidence of acute infarction, hemorrhage, hydrocephalus, extra-axial collection or mass lesion / mass effect. Prominence of the ventricles and sulci reflects mild cortical volume loss. Mild cerebellar atrophy is noted. Mild periventricular white matter change likely reflects small vessel ischemic microangiopathy. The brainstem and fourth ventricle are within normal limits. The basal ganglia are unremarkable in appearance. The cerebral hemispheres demonstrate grossly normal gray-white differentiation. No mass effect or midline shift is seen. Vascular: No hyperdense vessel or unexpected calcification. Skull: There is no  evidence of fracture; visualized osseous structures are unremarkable in appearance. Sinuses/Orbits: The visualized portions of the orbits are within normal limits. There is opacification of the right side of the sphenoid sinus and partial opacification of the right frontal sinus. The remaining paranasal sinuses and mastoid air cells are well-aerated. Other: No significant soft tissue abnormalities are seen. IMPRESSION: 1. No acute intracranial pathology seen on CT. 2. Mild cortical volume loss and scattered small vessel ischemic microangiopathy. 3. Opacification of the right side of  the sphenoid sinus and partial opacification of the right frontal sinus. Electronically Signed   By: Garald Balding M.D.   On: 07/31/2017 06:41   Ct Angio Neck W And/or Wo Contrast  Result Date: 07/31/2017 CLINICAL DATA:  Right-sided weakness EXAM: CT ANGIOGRAPHY HEAD AND NECK TECHNIQUE: Multidetector CT imaging of the head and neck was performed using the standard protocol during bolus administration of intravenous contrast. Multiplanar CT image reconstructions and MIPs were obtained to evaluate the vascular anatomy. Carotid stenosis measurements (when applicable) are obtained utilizing NASCET criteria, using the distal internal carotid diameter as the denominator. CONTRAST:  91mL ISOVUE-370 IOPAMIDOL (ISOVUE-370) INJECTION 76% COMPARISON:  Head CT from earlier today. Brain MRI report from 04/20/2013 FINDINGS: CTA NECK FINDINGS Aortic arch: Mild atherosclerotic plaque.  Two vessel branching. Right carotid system: Moderate mainly calcified plaque at the common carotid bifurcation. No flow limiting stenosis or ulceration. Left carotid system: Mild calcified plaque along the common carotid and ICA bulb. No flow limiting stenosis or ulceration. Vertebral arteries: No proximal subclavian stenosis. Left vertebral artery dominance. Intermittent mild irregularity of the right vertebral artery on reformats is is likely artifactual, accentuated by small vessel size. There is a small calcified plaque at the right vertebral origin without suspected flow limiting stenosis. Skeleton: Diffuse degenerative disc narrowing and endplate ridging. Other neck: Posterior left nasal cavity polyp extending into the nasopharynx where it measures up to 2 cm on axial slices. Patchy opacification of the paranasal sinuses. Upper chest: Probable coronary atherosclerotic calcification on the left. Review of the MIP images confirms the above findings CTA HEAD FINDINGS Anterior circulation: Carotid siphon atherosclerotic plaque with bilateral  stenosis. On coronal reformats stenosis at the bilateral mid cavernous segment measures ~ 60%. No large vessel occlusion. Best seen on coronal postcontrast imaging there is a focal narrowing of the distal left M1 segment from superior wall eccentric defect. This could be plaque or plaque with thrombus. Posterior circulation: Left dominant. The right vertebral artery ends in PICA. Mild-to-moderate narrowing of the left distal P2 segment. No branch occlusion or flow limiting stenosis. Venous sinuses: Patent Anatomic variants: None significant Delayed phase: Enhancing extra-axial mass along the left frontal operculum measuring up to 4.3 cm in diameter and 13 mm in thickness. No underlying brain edema seen. Review of the MIP images confirms the above findings IMPRESSION: 1. Negative for emergent large vessel occlusion. 2. Moderate to advanced left M1 segment stenosis due to eccentric filling defect, possibly symptomatic in this setting. 3. ~60% bilateral cavernous ICA narrowing due to calcified plaque. 4. No flow limiting stenosis in the neck. 5. Presumed meningioma along the left frontal convexity measuring up to 4.3 x 1.3 cm. 6. Left nasal cavity polyp herniating into the nasopharynx. Patchy bilateral sinusitis. Electronically Signed   By: Monte Fantasia M.D.   On: 07/31/2017 09:07   Mr Brain Wo Contrast  Result Date: 07/31/2017 CLINICAL DATA:  Focal neuro deficit.  Right-sided weakness EXAM: MRI HEAD WITHOUT CONTRAST TECHNIQUE: Multiplanar, multiecho pulse sequences of  the brain and surrounding structures were obtained without intravenous contrast. COMPARISON:  Head CT and CTA from earlier today FINDINGS: Brain: There is patchy acute primarily cortical infarct along the left frontal, parietal, and occipital convexities. This is aligned along the posterior watershed distribution. The largest confluent cortical infarct measures 2.5 cm in length at the parietal lobe. Numerous small remote bilateral cerebellar  infarcts, more numerous on the left. No acute hemorrhage, hydrocephalus, or collection. Homogeneous T2 isointense mass along the lateral left frontal convexity measuring 4.1 x 1.4 cm, again compatible with meningioma. Cerebral volume loss. Vascular: Major flow voids are preserved. CTA performed earlier the same day. Skull and upper cervical spine: Degenerative changes in the cervical spine. No focal marrow lesion. Sinuses/Orbits: Patchy sinus opacification with probable multiple polyps or retention cysts. There is a posterior left nasal cavity polyp herniating into the nasopharynx. IMPRESSION: 1. Scattered, primarily cortical acute infarction along the left cerebral convexity, posterior watershed pattern. 2. Presumed meningioma along the lateral left frontal convexity measuring 4.1 x 1.4 cm. No neighboring brain edema. 3. Multiple small remote cerebellar infarcts. 4. Chronic sinusitis.  Left nasal cavity and nasopharynx polyp. Electronically Signed   By: Monte Fantasia M.D.   On: 07/31/2017 09:34    Assessment/Plan: Diagnosis: Multiple left CVA Labs and images independently reviewed.  Records reviewed and summated above. Stroke: Continue secondary stroke prophylaxis and Risk Factor Modification listed below:   Antiplatelet therapy:   Blood Pressure Management:  Continue current medication with prn's with permisive HTN per primary team Statin Agent:   Diabetes management:   Right sided hemiparesis:  Motor recovery: Fluoxetine  1. Does the need for close, 24 hr/day medical supervision in concert with the patient's rehab needs make it unreasonable for this patient to be served in a less intensive setting? No  2. Co-Morbidities requiring supervision/potential complications: left pontine infarction with mild residual right upper extremity weakness, CAD (continue meds), HTN (monitor and provide prns in accordance with increased physical exertion and pain), diabetes mellitus (Monitor in accordance with  exercise and adjust meds as necessary) 3. Due to safety, disease management and patient education, does the patient require 24 hr/day rehab nursing? No 4. Does the patient require coordinated care of a physician, rehab nurse, PT (1-2 hrs/day, 5 days/week), OT (1-2 hrs/day, 5 days/week) and SLP (1-2 hrs/day, 5 days/week) to address physical and functional deficits in the context of the above medical diagnosis(es)? No Addressing deficits in the following areas: balance, endurance, locomotion, strength, transferring, bathing, dressing, cognition and psychosocial support 5. Can the patient actively participate in an intensive therapy program of at least 3 hrs of therapy per day at least 5 days per week? Yes 6. The potential for patient to make measurable gains while on inpatient rehab is good and fair 7. Anticipated functional outcomes upon discharge from inpatient rehab are modified independent  with PT, modified independent with OT, modified independent with SLP. 8. Estimated rehab length of stay to reach the above functional goals is: 3-6 days. 9. Anticipated D/C setting: Home 10. Anticipated post D/C treatments: HH therapy and Home excercise program 11. Overall Rehab/Functional Prognosis: good  RECOMMENDATIONS: This patient's condition is appropriate for continued rehabilitative care in the following setting: Patient making good functional gains, do not anticipate he will require CPR. Recommend home with outpatient therapies and PM&R outpatient follow-up. Patient has agreed to participate in recommended program. Potentially Note that insurance prior authorization may be required for reimbursement for recommended care.  Comment: Rehab Admissions Coordinator to  follow up.   I have personally performed a face to face diagnostic evaluation, including, but not limited to relevant history and physical exam findings, of this patient and developed relevant assessment and plan.  Additionally, I have  reviewed and concur with the physician assistant's documentation above.   Delice Lesch, MD, ABPMR Lavon Paganini Angiulli, PA-C 08/01/2017

## 2017-08-01 NOTE — Progress Notes (Signed)
   Subjective:  Alex Cortez was sitting in bed this morning with wife at bedside. Reports working with PT, OT, and SLP. Continues to endorse right upper extremity weakness as well as cognitive difficulty. No LE weakness. Wife reports cognitive slowing as well, which is not consistent with his baseline. He is eager to go home.  Objective:  Vital signs in last 24 hours: Vitals:   07/31/17 2335 08/01/17 0341 08/01/17 0748 08/01/17 1131  BP: (!) 162/86 (!) 167/89 (!) 159/79 (!) 180/87  Pulse: 71 69 68 71  Resp: 17 18 18 18   Temp: 98.3 F (36.8 C) 98.6 F (37 C) 97.6 F (36.4 C) 98.6 F (37 C)  TempSrc: Oral Oral Oral Oral  SpO2: 96% 95% 97% 98%  Weight:      Height:       GEN: Sitting in bed in NAD CV: NR & RR, no m/r/g PULM: CTAB, no wheezes or rales MSK: No LE edema NEURO: 4/5 RUE strength, 5/5 LUE strength, 5/5 BLE strength. Normal sensation to light touch in BUE and BLE. Mild left lower facial weakness. Mild right-sided dysmetria (possibly related to RUE weakness). Expressive aphasia.  Assessment/Plan:  Active Problems:   Cerebral embolism with cerebral infarction   CVA (cerebral vascular accident) [facility])  Alex Cortez is a 67yo male with PMH of L pontine stroke in 2014 (with mild residual RUE weakness), CAD s/p DES to LAD in 2011, diabetes, and HTN who is admitted for multiple left cortical infarctions. His symptoms are improving, current deficits include RUE weakness, left lower facial weakness, and expressive aphasia/cognitive slowing. He has no hx of afib, and is also noted to have left M1 segment stenosis and 60% bilateral ICA narrowing, raising concern for thrombotic etiology.  Multiple left cortical acute infarctions Left M1 segment stenosis 60% bilateral cavernous ICA narrowing Possibly thrombotic disease. Tele reviewed, patient in NSR. No evidence of atrial fibrillation or flutter. Neuro has evaluated and patient started on DAPT. TTE and LE doppler ultrasound are pending.  PT/OT recommending outpatient OT follow up, and speech therapy recommending inpatient rehab evaluation. - Neurology consulted; appreciate their assistance - Allowing for permissive HTN (systolic <060 and diastolic <045) - Echocardiogram, LE vascular ultrasound - Switch to aspirin 81mg  daily and clopidogrel 75mg  daily x38months. Then after 3 months, change to plavix alone - Continue home atorvastatin 80mg  QHS - Outpatient 30-day monitor - PT/OT eval -> Outpatient OT - Speech therapy recommends inpatient rehab consult - CIR consult placed for eligibility for inpatient rehab  Incidental left frontal meningioma - Outpatient Neurology follow-up  HTN Hx of CAD s/p DES to LAD in 2011 Home medications include amlodipine 10mg  daily, benazepril 40mg  BID, carvedilol 25mg  BID, and imdur 30mg  daily. - Allowing for permissive HTN in setting of CVA - Hold home amlodipine, benazepril, carvedilol, and imdur  Diabetes Home medications include metformin 500mg  BID, Levemir 100 units QHS, and Apidra 30-36u TID with meals. Recent A1c 5/2 was 9.9. - Increase to Levemir 75 units QHS - Novolog 15 units TID WC - SSI-R TID WC & HS - CBG monitoring TID WC & QHS  Dispo: Anticipated discharge in approximately 1-2 day(s).   Alex Ewing, MD 08/01/2017, 2:45 PM Pager: Mamie Nick [phone]

## 2017-08-01 NOTE — Evaluation (Signed)
Occupational Therapy Evaluation Patient Details Name: Alex Cortez MRN: [id number] DOB: September 28, 1950 Today's Date: 08/01/2017    History of Present Illness 67 y.o. male past medical history of a left pontine stroke with no residual deficits, coronary artery disease, poorly controlled diabetes, hypertension, hyperlipidemia, presented to the emergency room for evaluation of right-sided weakness that started around 6 PM on 07/30/2017. imaging revealed Scattered, primarily cortical acute infarction along the left cerebral convexity, posterior watershed pattern, Presumed meningioma along the lateral left frontal convexity measuring 4.1 x 1.4 cm   Clinical Impression   This 67 y/o male presents with the above. At baseline pt is independent with ADLs and functional mobility, lives with spouse. Pt presenting with cognitive deficits, RUE impairments (including decreased fine motor and impaired coordination) impacting his overall functional performance. Pt completing room and hallway level functional mobility this session without AD and MinGuard throughout, requires minguard-minA for LB ADLs, requiring increased time and effort for task completion due to difficulties incorporating RUE into functional tasks. Pt will benefit from continued acute OT services and recommend follow up outpatient neuro OT services to maximize his functional use of RUE, safety and independence with ADLs and mobility.     Follow Up Recommendations  Outpatient OT;Supervision/Assistance - 24 hour(Outpatient Neuro OT)    Equipment Recommendations  3 in 1 bedside commode           Precautions / Restrictions Precautions Precautions: Fall      Mobility Bed Mobility               General bed mobility comments: received sitting EOB  Transfers Overall transfer level: Needs assistance   Transfers: Sit to/from Stand Sit to Stand: Supervision         General transfer comment: No physical assist required, able to  elevate to standing without difficulty    Balance Overall balance assessment: Needs assistance   Sitting balance-Leahy Scale: Good       Standing balance-Leahy Scale: Good Standing balance comment: able to stand and perform dynamic tasks without LOB              High level balance activites: Head turns;Direction changes High Level Balance Comments: patient with modest instability but no overt LOB           ADL either performed or assessed with clinical judgement   ADL Overall ADL's : Needs assistance/impaired Eating/Feeding: Supervision/ safety;Sitting   Grooming: Min guard;Standing   Upper Body Bathing: Min guard;Sitting   Lower Body Bathing: Min guard;Sit to/from stand   Upper Body Dressing : Min guard;Sitting;Set up Upper Body Dressing Details (indicate cue type and reason): to don second gown  Lower Body Dressing: Sit to/from stand;Minimal assistance Lower Body Dressing Details (indicate cue type and reason): pt requires significant time/effort to don socks sitting EOB, is able to do so given increased time/effort  Toilet Transfer: Ambulation;Regular Toilet;Min guard   Toileting- Clothing Manipulation and Hygiene: Sit to/from stand;Min guard       Functional mobility during ADLs: Min guard General ADL Comments: pt completing room and hallway level mobility with overall close minguard for safety without AD, requires increased time/effort for functional tasks due to RUE impairments      Vision Baseline Vision/History: No visual deficits Patient Visual Report: No change from baseline Vision Assessment?: No apparent visual deficits Additional Comments: to be further assessed in functional context                 Pertinent Vitals/Pain Pain Assessment: No/denies  pain     Hand Dominance Right   Extremity/Trunk Assessment Upper Extremity Assessment Upper Extremity Assessment: RUE deficits/detail RUE Deficits / Details: shoulder and elbow AROM appears  WFL given increased time/effort; decreased coordination noted with finger to nose, decreased fine motor  RUE Sensation: decreased light touch(inconsistent responses re: reports of sensation ) RUE Coordination: decreased fine motor;decreased gross motor   Lower Extremity Assessment Lower Extremity Assessment: Defer to PT evaluation       Communication Communication Communication: Expressive difficulties   Cognition Arousal/Alertness: Awake/alert Behavior During Therapy: WFL for tasks assessed/performed Overall Cognitive Status: Impaired/Different from baseline Area of Impairment: Safety/judgement;Awareness;Problem solving;Memory;Attention;Following commands                   Current Attention Level: Selective Memory: Decreased short-term memory Following Commands: Follows one step commands consistently Safety/Judgement: Decreased awareness of safety;Decreased awareness of deficits Awareness: Emergent Problem Solving: Requires verbal cues;Requires tactile cues;Slow processing General Comments: patient with some noted confusion during session, patient with some fluctuations in recognizing right vs left. Patient's wife present at bedside and noting inconsistent or incorrect answers during home environmental questions, some confabulation noted                    Home Living Family/patient expects to be discharged to:: Private residence Living Arrangements: Spouse/significant other Available Help at Discharge: Family Type of Home: House Home Access: Stairs to enter Technical brewer of Steps: 4 Entrance Stairs-Rails: Left Home Layout: Two level;Bed/bath upstairs Alternate Level Stairs-Number of Steps: 13 Alternate Level Stairs-Rails: Right Bathroom Shower/Tub: Teacher, early years/pre: Standard     Home Equipment: None      Lives With: Spouse    Prior Functioning/Environment Level of Independence: Independent                 OT Problem  List: Decreased strength;Impaired UE functional use;Impaired balance (sitting and/or standing);Decreased cognition;Decreased range of motion;Decreased activity tolerance;Decreased coordination      OT Treatment/Interventions: Self-care/ADL training;DME and/or AE instruction;Therapeutic activities;Balance training;Therapeutic exercise;Cognitive remediation/compensation;Patient/family education    OT Goals(Current goals can be found in the care plan section) Acute Rehab OT Goals Patient Stated Goal: to get better OT Goal Formulation: With patient Time For Goal Achievement: 08/15/17 Potential to Achieve Goals: Good  OT Frequency: Min 3X/week               Co-evaluation PT/OT/SLP Co-Evaluation/Treatment: Yes Reason for Co-Treatment: Necessary to address cognition/behavior during functional activity PT goals addressed during session: Mobility/safety with mobility OT goals addressed during session: ADL's and self-care      AM-PAC PT "6 Clicks" Daily Activity     Outcome Measure Help from another person eating meals?: None Help from another person taking care of personal grooming?: A Little Help from another person toileting, which includes using toliet, bedpan, or urinal?: A Little Help from another person bathing (including washing, rinsing, drying)?: A Little Help from another person to put on and taking off regular upper body clothing?: None Help from another person to put on and taking off regular lower body clothing?: A Little 6 Click Score: 20   End of Session Equipment Utilized During Treatment: Gait belt Nurse Communication: Mobility status  Activity Tolerance: Patient tolerated treatment well Patient left: with call bell/phone within reach;with family/visitor present;Other (comment);with bed alarm set(sitting EOB )  OT Visit Diagnosis: Other symptoms and signs involving the nervous system (R29.898);Other symptoms and signs involving cognitive function  Time:  3735-7897 OT Time Calculation (min): 20 min Charges:  OT General Charges $OT Visit: 1 Visit OT Evaluation $OT Eval Moderate Complexity: 1 Mod G-Codes:     Lou Cal, OT Pager [phone] 08/01/2017   Raymondo Band 08/01/2017, 10:54 AM

## 2017-08-01 NOTE — Progress Notes (Signed)
Inpatient Rehabilitation Admissions Coordinator  Patient with [facility] or outpt therapies recommended at this time. Not a candidate for an inpt rehab admit. We will sign off at this time.   Danne Baxter, RN, MSN Rehab Admissions Coordinator [phone] 08/01/2017 5:35 PM

## 2017-08-01 NOTE — Evaluation (Signed)
Physical Therapy Evaluation Patient Details Name: Alex Cortez MRN: [id number] DOB: 1950/11/14 Today's Date: 08/01/2017   History of Present Illness  67 y.o. male past medical history of a left pontine stroke with no residual deficits, coronary artery disease, poorly controlled diabetes, hypertension, hyperlipidemia, presented to the emergency room for evaluation of right-sided weakness that started around 6 PM on 07/30/2017. imaging revealed Scattered, primarily cortical acute infarction along the left cerebral convexity, posterior watershed pattern, Presumed meningioma along the lateral left frontal convexity measuring 4.1 x 1.4 cm  Clinical Impression  Orders received for PT evaluation. Patient demonstrates deficits in functional mobility as indicated below. Will benefit from continued skilled PT to address deficits and maximize function. Will see as indicated and progress as tolerated.      Follow Up Recommendations Supervision/Assistance - 24 hour(defer needs to OT recommendations)    Equipment Recommendations  None recommended by PT    Recommendations for Other Services       Precautions / Restrictions Precautions Precautions: Fall      Mobility  Bed Mobility               General bed mobility comments: received sitting EOB  Transfers Overall transfer level: Needs assistance   Transfers: Sit to/from Stand Sit to Stand: Supervision         General transfer comment: No physical assist required, able to elevate to standing without difficulty  Ambulation/Gait Ambulation/Gait assistance: Min guard;Supervision Ambulation Distance (Feet): 100 Feet Assistive device: None Gait Pattern/deviations: Step-through pattern;Drifts right/left Gait velocity: modestly decreased  Gait velocity interpretation: 1.31 - 2.62 ft/sec, indicative of limited community ambulator General Gait Details: able to ambulate without overt LOB.   Stairs            Wheelchair  Mobility    Modified Rankin (Stroke Patients Only) Modified Rankin (Stroke Patients Only) Pre-Morbid Rankin Score: No symptoms Modified Rankin: Moderate disability     Balance Overall balance assessment: Needs assistance   Sitting balance-Leahy Scale: Good       Standing balance-Leahy Scale: Good Standing balance comment: able to stand and perform dynamic tasks without LOB              High level balance activites: Head turns;Direction changes High Level Balance Comments: patient with modest instability but no overt LOB             Pertinent Vitals/Pain Pain Assessment: No/denies pain    Home Living Family/patient expects to be discharged to:: Private residence Living Arrangements: Spouse/significant other Available Help at Discharge: Family Type of Home: House Home Access: Stairs to enter Entrance Stairs-Rails: Left Entrance Stairs-Number of Steps: 4 Home Layout: Two level;Bed/bath upstairs Home Equipment: None      Prior Function Level of Independence: Independent               Hand Dominance   Dominant Hand: Right    Extremity/Trunk Assessment   Upper Extremity Assessment Upper Extremity Assessment: RUE deficits/detail RUE Sensation: decreased light touch(inconsistent responses re: sensory testing) RUE Coordination: decreased fine motor;decreased gross motor    Lower Extremity Assessment Lower Extremity Assessment: Overall WFL for tasks assessed(no focal LE deficits noted)       Communication   Communication: Expressive difficulties  Cognition Arousal/Alertness: Awake/alert   Overall Cognitive Status: Impaired/Different from baseline Area of Impairment: Safety/judgement;Awareness;Problem solving;Memory;Attention;Following commands                   Current Attention Level: Selective Memory: Decreased short-term memory Following  Commands: Follows one step commands consistently Safety/Judgement: Decreased awareness of  safety;Decreased awareness of deficits   Problem Solving: Requires verbal cues;Requires tactile cues;Slow processing General Comments: patient with some noted confusion during session, patient with some fluctuations in recognizing right vs left. Patient's wife present at bedside and noting inconsistent or incorrect answers during home environmental questions, some confabulation noted      General Comments      Exercises     Assessment/Plan    PT Assessment Patient needs continued PT services  PT Problem List Decreased activity tolerance;Decreased balance;Decreased mobility;Decreased safety awareness       PT Treatment Interventions DME instruction;Gait training;Stair training;Functional mobility training;Balance training;Therapeutic exercise;Therapeutic activities;Patient/family education    PT Goals (Current goals can be found in the Care Plan section)  Acute Rehab PT Goals Patient Stated Goal: to get better PT Goal Formulation: With patient/family Time For Goal Achievement: 08/15/17 Potential to Achieve Goals: Good    Frequency Min 3X/week   Barriers to discharge        Co-evaluation PT/OT/SLP Co-Evaluation/Treatment: Yes Reason for Co-Treatment: Necessary to address cognition/behavior during functional activity PT goals addressed during session: Mobility/safety with mobility OT goals addressed during session: ADL's and self-care       AM-PAC PT "6 Clicks" Daily Activity  Outcome Measure Difficulty turning over in bed (including adjusting bedclothes, sheets and blankets)?: None Difficulty moving from lying on back to sitting on the side of the bed? : None Difficulty sitting down on and standing up from a chair with arms (e.g., wheelchair, bedside commode, etc,.)?: None Help needed moving to and from a bed to chair (including a wheelchair)?: A Little Help needed walking in hospital room?: A Little Help needed climbing 3-5 steps with a railing? : A Little 6 Click  Score: 21    End of Session Equipment Utilized During Treatment: Gait belt Activity Tolerance: Patient tolerated treatment well Patient left: in bed;with call bell/phone within reach;with bed alarm set;with family/visitor present(sitting EOB) Nurse Communication: Mobility status PT Visit Diagnosis: Unsteadiness on feet (R26.81);Other symptoms and signs involving the nervous system (R29.898)    Time: 5830-9407 PT Time Calculation (min) (ACUTE ONLY): 19 min   Charges:   PT Evaluation $PT Eval Moderate Complexity: 1 Mod     PT G Codes:        Alben Deeds, PT DPT  Board Certified Neurologic Specialist [place] 08/01/2017, 8:38 AM

## 2017-08-01 NOTE — Care Management Note (Signed)
Case Management Note  Patient Details  Name: CHALES PELISSIER MRN: [id number] Date of Birth: [birth year]  Subjective/Objective:  Pt admitted with CVA. He is from home with spouse.                  Action/Plan: No f/u per PT. OT recommending outpatient therapy. Both recommending 24 hour supervision. CM following for physician orders.  Expected Discharge Date:                  Expected Discharge Plan:  OP Rehab  In-House Referral:     Discharge planning Services  CM Consult  Post Acute Care Choice:    Choice offered to:     DME Arranged:    DME Agency:     HH Arranged:    HH Agency:     Status of Service:  In process, will continue to follow  If discussed at Long Length of Stay Meetings, dates discussed:    Additional Comments:  Pollie Friar, RN 08/01/2017, 4:04 PM

## 2017-08-02 ENCOUNTER — Inpatient Hospital Stay (HOSPITAL_COMMUNITY): Payer: Medicare Other

## 2017-08-02 DIAGNOSIS — G8101 Flaccid hemiplegia affecting right dominant side: Secondary | ICD-10-CM

## 2017-08-02 DIAGNOSIS — I63512 Cerebral infarction due to unspecified occlusion or stenosis of left middle cerebral artery: Principal | ICD-10-CM

## 2017-08-02 DIAGNOSIS — E785 Hyperlipidemia, unspecified: Secondary | ICD-10-CM

## 2017-08-02 LAB — GLUCOSE, CAPILLARY
GLUCOSE-CAPILLARY: 214 mg/dL — AB (ref 65–99)
GLUCOSE-CAPILLARY: 197 mg/dL — AB (ref 65–99)
GLUCOSE-CAPILLARY: 195 mg/dL — AB (ref 65–99)
Glucose-Capillary: 250 mg/dL — ABNORMAL HIGH (ref 65–99)

## 2017-08-02 MED ORDER — INSULIN ASPART 100 UNIT/ML ~~LOC~~ SOLN
20.0000 [IU] | Freq: Three times a day (TID) | SUBCUTANEOUS | Status: DC
  Administered 2017-08-02 – 2017-08-03 (×4): 20 [IU] via SUBCUTANEOUS

## 2017-08-02 MED ORDER — ASPIRIN EC 325 MG PO TBEC
325.0000 mg | DELAYED_RELEASE_TABLET | Freq: Every day | ORAL | Status: DC
  Administered 2017-08-03: 325 mg via ORAL
  Filled 2017-08-02: qty 1

## 2017-08-02 MED ORDER — INSULIN ASPART 100 UNIT/ML ~~LOC~~ SOLN
0.0000 [IU] | Freq: Every day | SUBCUTANEOUS | Status: DC
  Administered 2017-08-02: 2 [IU] via SUBCUTANEOUS

## 2017-08-02 NOTE — Progress Notes (Signed)
Subjective:  Alex Cortez was resting in bed this morning with wife at bedside. Wife reports she saw him yesterday evening and that he was doing okay last night, but his aphasia and right upper extremity weakness are worse this morning. Denies HA, N/V. No noted seizure activity.  Objective:  Vital signs in last 24 hours: Vitals:   08/01/17 1541 08/01/17 2001 08/01/17 2331 08/02/17 0400  BP: (!) 170/84 (!) 152/89 (!) 165/87 (!) 173/95  Pulse: 65 61 66 83  Resp: 18 18 18 18   Temp: 98.3 F (36.8 C) 98.9 F (37.2 C) 98.6 F (37 C) 98.6 F (37 C)  TempSrc: Oral Oral Oral Oral  SpO2: 94% 95% 96% 96%  Weight:      Height:       GEN: Lying in bed in NAD, oriented to name, year, and president CV: NR & RR, no m/r/g PULM: CTAB, no wheezes or rales MSK: No LE edema NEURO: Very limited movement of RUE, close to flaccid paralysis, unable to squeeze on RUE or lift RUE, 5/5 LUE strength, 5/5 BLE strength. Normal sensation to light touch in BUE and BLE. Left lower facial weakness is improved compared to yesterday. Expressive aphasia. Cognitive slowing.  Assessment/Plan:  Active Problems:   Cerebral embolism with cerebral infarction   CVA (cerebral vascular accident) ([place])   History of CVA with residual deficit   Coronary artery disease involving native coronary artery of native heart without angina pectoris   Essential hypertension   Diabetes mellitus type 2 in obese [facility])  Alex Cortez is a 67yo male with PMH of L pontine stroke in 2014 (with mild residual RUE weakness), CAD s/p DES to LAD in 2011, diabetes, and HTN who is admitted for multiple left cortical infarctions in a watershed distribution, secondary to thrombotic disease. RUE weakness and aphasia/cognitive slowing are worse this morning on my exam compared to yesterday. sBP has remained in 150s-170s overnight - will check orthostatics to ensure his BP is not dropping when sitting up, leading to hypoperfusion. Discussed with Neuro who  recommends repeat limited MRI brain to evaluate for extension of disease.  Multiple left cortical acute infarctions Left M1 segment stenosis 60% bilateral cavernous ICA narrowing Tele reviewed, patient in NSR. TTE without evidence of afib or valvular disease. LE doppler ultrasound negative for DVT. Most likely etiology is thrombotic disease. Patient's neuro exam is worse this morning compared to yesterday, concerning for extension of infarction. Repeating imaging today. - Neurology consulted; appreciate their assistance - Will continue allowing for permissive HTN (systolic <342 and diastolic <876) given worsened deficits this morning - Aspirin 81mg  daily and clopidogrel 75mg  daily x3 weeks, then change to plavix alone - Continue home atorvastatin 80mg  QHS - Outpatient 30-day monitor - F/u limited MRI brain today - Orthostatics  Incidental left frontal meningioma No symptoms of intracranial mass effect, although worsened deficits this morning. No seizure activity noted. Repeating brain imaging today. - Outpatient Neurology follow-up - Repeat limited MRI brain today  HTN Hx of CAD s/p DES to LAD in 2011 Home medications include amlodipine 10mg  daily, benazepril 40mg  BID, carvedilol 25mg  BID, and imdur 30mg  daily. - Allowing for permissive HTN in setting of CVA - Hold home amlodipine, benazepril, carvedilol, and imdur  Diabetes Home medications include metformin 500mg  BID, Levemir 100 units QHS, and Apidra 30-36u TID with meals. Recent A1c 5/2 was 9.9. - Continue Levemir 75 units QHS - Novolog 15 units TID WC - SSI-R TID WC & HS - CBG monitoring  TID WC & QHS  Dispo: Anticipated discharge in approximately 1-2 day(s).   Colbert Ewing, MD 08/02/2017, 7:04 AM Pager: Mamie Nick [phone]

## 2017-08-02 NOTE — Progress Notes (Signed)
Occupational Therapy Treatment Patient Details Name: Alex Cortez MRN: [id number] DOB: 22-Mar-1950 Today's Date: 08/02/2017    History of present illness 67 y.o. male past medical history of a left pontine stroke with no residual deficits, coronary artery disease, poorly controlled diabetes, hypertension, hyperlipidemia, presented to the emergency room for evaluation of right-sided weakness that started around 6 PM on 07/30/2017. imaging revealed Scattered, primarily cortical acute infarction along the left cerebral convexity, posterior watershed pattern, Presumed meningioma along the lateral left frontal convexity measuring 4.1 x 1.4 cm   OT comments  Recommend Chaplain and R resting hand splint for night time use only. Pt currently with no active digit activation and no wrist activation. Pt educated on protection of RUE with movement and visual attention to task. Wife present for all education.    Follow Up Recommendations  Outpatient OT;Supervision/Assistance - 24 hour    Equipment Recommendations  3 in 1 bedside commode    Recommendations for Other Services Other (comment)(ortho tech call biotech for Resting R UE hand splint NIGHT)    Precautions / Restrictions Precautions Precautions: Fall       Mobility Bed Mobility               General bed mobility comments: oob in chair  Transfers Overall transfer level: Needs assistance   Transfers: Sit to/from Stand Sit to Stand: Supervision         General transfer comment: cues for R UE positioning for safety of R UE    Balance Overall balance assessment: Needs assistance             Standing balance comment: pt demonstrates x1 buckle at sink level with R LE                           ADL either performed or assessed with clinical judgement   ADL Overall ADL's : Needs assistance/impaired     Grooming: Moderate assistance Grooming Details (indicate cue type and reason): pt educated on importance  of bil UE use during task for facilitation to motor memory                               General ADL Comments: sesion focused on UE exercises. Pt transfering rooms so ambulated to next room. pt stopping at sink for grooming task      Vision   Additional Comments: L gaze preference and needs cues throughout session to look at R arm. pt has inattention to R UE at times   Perception     Praxis      Cognition Arousal/Alertness: Awake/alert Behavior During Therapy: WFL for tasks assessed/performed Overall Cognitive Status: Impaired/Different from baseline Area of Impairment: Memory;Awareness                   Current Attention Level: Selective Memory: Decreased short-term memory   Safety/Judgement: Decreased awareness of deficits              Exercises Exercises: Other exercises Other Exercises Other Exercises: see UE notes: completed shoulder elbow and scapula movements this session Other Exercises: pt could benefit from resting hand splint for night time use   Shoulder Instructions       General Comments      Pertinent Vitals/ Pain       Pain Assessment: No/denies pain  Home Living  Prior Functioning/Environment              Frequency  Min 3X/week        Progress Toward Goals  OT Goals(current goals can now be found in the care plan section)  Progress towards OT goals: Progressing toward goals  Acute Rehab OT Goals Patient Stated Goal: to get better OT Goal Formulation: With patient Time For Goal Achievement: 08/15/17 Potential to Achieve Goals: Good ADL Goals Pt Will Perform Grooming: with supervision;standing Pt Will Perform Lower Body Bathing: with supervision;sit to/from stand Pt Will Perform Lower Body Dressing: with supervision;sit to/from stand Pt Will Transfer to Toilet: with supervision;ambulating;regular height toilet Pt Will Perform Toileting - Clothing  Manipulation and hygiene: with supervision;sit to/from stand Pt Will Perform Tub/Shower Transfer: Tub transfer;with min guard assist;ambulating;3 in 1 Pt/caregiver will Perform Home Exercise Program: Right Upper extremity;Increased strength;Increased ROM;With written HEP provided;With Supervision Additional ADL Goal #1: Pt will demonstrate anticipatory awareness during functional task completion.  Plan Discharge plan remains appropriate    Co-evaluation                 AM-PAC PT "6 Clicks" Daily Activity     Outcome Measure   Help from another person eating meals?: None Help from another person taking care of personal grooming?: A Little Help from another person toileting, which includes using toliet, bedpan, or urinal?: A Little Help from another person bathing (including washing, rinsing, drying)?: A Little Help from another person to put on and taking off regular upper body clothing?: None Help from another person to put on and taking off regular lower body clothing?: A Little 6 Click Score: 20    End of Session    OT Visit Diagnosis: Other symptoms and signs involving the nervous system (R29.898);Other symptoms and signs involving cognitive function   Activity Tolerance Patient tolerated treatment well   Patient Left in chair;with call bell/phone within reach;with chair alarm set;with family/visitor present   Nurse Communication Mobility status;Precautions        Time: 1432-1500 OT Time Calculation (min): 28 min  Charges: OT General Charges $OT Visit: 1 Visit OT Treatments $Therapeutic Exercise: 23-37 mins   Jeri Modena   OTR/L Pager: [phone] Office: [phone] .    Parke Poisson B 08/02/2017, 3:24 PM

## 2017-08-02 NOTE — Progress Notes (Addendum)
STROKE TEAM PROGRESS NOTE   INTERVAL HISTORY Dr. Erlinda Hong notified by teaching service that patient's hemiparesis is worse this morning and that he is also more garbled with his speech.  Wife at bedside. She confirms findings reported.  RN unaware of change in patient condition since yesterday. Per wife, speech more slurred and hesitant when she arrived today, not using his right arm. Overall, pt more lethargic, but will awaken to touch. Wife states he did ok with breakfast - no coughing, choking.    Vitals:   08/01/17 1541 08/01/17 2001 08/01/17 2331 08/02/17 0400  BP: (!) 170/84 (!) 152/89 (!) 165/87 (!) 173/95  Pulse: 65 61 66 83  Resp: 18 18 18 18   Temp: 98.3 F (36.8 C) 98.9 F (37.2 C) 98.6 F (37 C) 98.6 F (37 C)  TempSrc: Oral Oral Oral Oral  SpO2: 94% 95% 96% 96%  Weight:      Height:        CBC:  Recent Labs  Lab 07/31/17 0555 07/31/17 0601  WBC 6.0  --   NEUTROABS 3.1  --   HGB 14.2 15.3  HCT 45.0 45.0  MCV 83.2  --   PLT 221  --     Basic Metabolic Panel:  Recent Labs  Lab 07/31/17 0555 07/31/17 0601 08/01/17 0243  NA 141 143 141  K 4.5 4.3 3.8  CL 105 104 105  CO2 26  --  25  GLUCOSE 197* 193* 216*  BUN 14 15 11   CREATININE 1.12 0.90 0.90  CALCIUM 9.3  --  9.2   Lipid Panel:     Component Value Date/Time   CHOL 138 08/01/2017 0243   TRIG 98 08/01/2017 0243   HDL 31 (L) 08/01/2017 0243   CHOLHDL 4.5 08/01/2017 0243   VLDL 20 08/01/2017 0243   LDLCALC 87 08/01/2017 0243   HgbA1c:  Lab Results  Component Value Date   HGBA1C 9.9 (H) 07/12/2017   Urine Drug Screen: No results found for: LABOPIA, COCAINSCRNUR, LABBENZ, AMPHETMU, THCU, LABBARB  Alcohol Level No results found for: ETH  IMAGING Ct Angio Head W Or Wo Contrast  Result Date: 07/31/2017 CLINICAL DATA:  Right-sided weakness EXAM: CT ANGIOGRAPHY HEAD AND NECK TECHNIQUE: Multidetector CT imaging of the head and neck was performed using the standard protocol during bolus administration of  intravenous contrast. Multiplanar CT image reconstructions and MIPs were obtained to evaluate the vascular anatomy. Carotid stenosis measurements (when applicable) are obtained utilizing NASCET criteria, using the distal internal carotid diameter as the denominator. CONTRAST:  4mL ISOVUE-370 IOPAMIDOL (ISOVUE-370) INJECTION 76% COMPARISON:  Head CT from earlier today. Brain MRI report from 04/20/2013 FINDINGS: CTA NECK FINDINGS Aortic arch: Mild atherosclerotic plaque.  Two vessel branching. Right carotid system: Moderate mainly calcified plaque at the common carotid bifurcation. No flow limiting stenosis or ulceration. Left carotid system: Mild calcified plaque along the common carotid and ICA bulb. No flow limiting stenosis or ulceration. Vertebral arteries: No proximal subclavian stenosis. Left vertebral artery dominance. Intermittent mild irregularity of the right vertebral artery on reformats is is likely artifactual, accentuated by small vessel size. There is a small calcified plaque at the right vertebral origin without suspected flow limiting stenosis. Skeleton: Diffuse degenerative disc narrowing and endplate ridging. Other neck: Posterior left nasal cavity polyp extending into the nasopharynx where it measures up to 2 cm on axial slices. Patchy opacification of the paranasal sinuses. Upper chest: Probable coronary atherosclerotic calcification on the left. Review of the MIP images confirms the  above findings CTA HEAD FINDINGS Anterior circulation: Carotid siphon atherosclerotic plaque with bilateral stenosis. On coronal reformats stenosis at the bilateral mid cavernous segment measures ~ 60%. No large vessel occlusion. Best seen on coronal postcontrast imaging there is a focal narrowing of the distal left M1 segment from superior wall eccentric defect. This could be plaque or plaque with thrombus. Posterior circulation: Left dominant. The right vertebral artery ends in PICA. Mild-to-moderate narrowing of  the left distal P2 segment. No branch occlusion or flow limiting stenosis. Venous sinuses: Patent Anatomic variants: None significant Delayed phase: Enhancing extra-axial mass along the left frontal operculum measuring up to 4.3 cm in diameter and 13 mm in thickness. No underlying brain edema seen. Review of the MIP images confirms the above findings IMPRESSION: 1. Negative for emergent large vessel occlusion. 2. Moderate to advanced left M1 segment stenosis due to eccentric filling defect, possibly symptomatic in this setting. 3. ~60% bilateral cavernous ICA narrowing due to calcified plaque. 4. No flow limiting stenosis in the neck. 5. Presumed meningioma along the left frontal convexity measuring up to 4.3 x 1.3 cm. 6. Left nasal cavity polyp herniating into the nasopharynx. Patchy bilateral sinusitis. Electronically Signed   By: Monte Fantasia M.D.   On: 07/31/2017 09:07   Ct Angio Neck W And/or Wo Contrast  Result Date: 07/31/2017 CLINICAL DATA:  Right-sided weakness EXAM: CT ANGIOGRAPHY HEAD AND NECK TECHNIQUE: Multidetector CT imaging of the head and neck was performed using the standard protocol during bolus administration of intravenous contrast. Multiplanar CT image reconstructions and MIPs were obtained to evaluate the vascular anatomy. Carotid stenosis measurements (when applicable) are obtained utilizing NASCET criteria, using the distal internal carotid diameter as the denominator. CONTRAST:  73mL ISOVUE-370 IOPAMIDOL (ISOVUE-370) INJECTION 76% COMPARISON:  Head CT from earlier today. Brain MRI report from 04/20/2013 FINDINGS: CTA NECK FINDINGS Aortic arch: Mild atherosclerotic plaque.  Two vessel branching. Right carotid system: Moderate mainly calcified plaque at the common carotid bifurcation. No flow limiting stenosis or ulceration. Left carotid system: Mild calcified plaque along the common carotid and ICA bulb. No flow limiting stenosis or ulceration. Vertebral arteries: No proximal  subclavian stenosis. Left vertebral artery dominance. Intermittent mild irregularity of the right vertebral artery on reformats is is likely artifactual, accentuated by small vessel size. There is a small calcified plaque at the right vertebral origin without suspected flow limiting stenosis. Skeleton: Diffuse degenerative disc narrowing and endplate ridging. Other neck: Posterior left nasal cavity polyp extending into the nasopharynx where it measures up to 2 cm on axial slices. Patchy opacification of the paranasal sinuses. Upper chest: Probable coronary atherosclerotic calcification on the left. Review of the MIP images confirms the above findings CTA HEAD FINDINGS Anterior circulation: Carotid siphon atherosclerotic plaque with bilateral stenosis. On coronal reformats stenosis at the bilateral mid cavernous segment measures ~ 60%. No large vessel occlusion. Best seen on coronal postcontrast imaging there is a focal narrowing of the distal left M1 segment from superior wall eccentric defect. This could be plaque or plaque with thrombus. Posterior circulation: Left dominant. The right vertebral artery ends in PICA. Mild-to-moderate narrowing of the left distal P2 segment. No branch occlusion or flow limiting stenosis. Venous sinuses: Patent Anatomic variants: None significant Delayed phase: Enhancing extra-axial mass along the left frontal operculum measuring up to 4.3 cm in diameter and 13 mm in thickness. No underlying brain edema seen. Review of the MIP images confirms the above findings IMPRESSION: 1. Negative for emergent large vessel occlusion. 2. Moderate  to advanced left M1 segment stenosis due to eccentric filling defect, possibly symptomatic in this setting. 3. ~60% bilateral cavernous ICA narrowing due to calcified plaque. 4. No flow limiting stenosis in the neck. 5. Presumed meningioma along the left frontal convexity measuring up to 4.3 x 1.3 cm. 6. Left nasal cavity polyp herniating into the  nasopharynx. Patchy bilateral sinusitis. Electronically Signed   By: Monte Fantasia M.D.   On: 07/31/2017 09:07   Mr Brain Wo Contrast  Result Date: 07/31/2017 CLINICAL DATA:  Focal neuro deficit.  Right-sided weakness EXAM: MRI HEAD WITHOUT CONTRAST TECHNIQUE: Multiplanar, multiecho pulse sequences of the brain and surrounding structures were obtained without intravenous contrast. COMPARISON:  Head CT and CTA from earlier today FINDINGS: Brain: There is patchy acute primarily cortical infarct along the left frontal, parietal, and occipital convexities. This is aligned along the posterior watershed distribution. The largest confluent cortical infarct measures 2.5 cm in length at the parietal lobe. Numerous small remote bilateral cerebellar infarcts, more numerous on the left. No acute hemorrhage, hydrocephalus, or collection. Homogeneous T2 isointense mass along the lateral left frontal convexity measuring 4.1 x 1.4 cm, again compatible with meningioma. Cerebral volume loss. Vascular: Major flow voids are preserved. CTA performed earlier the same day. Skull and upper cervical spine: Degenerative changes in the cervical spine. No focal marrow lesion. Sinuses/Orbits: Patchy sinus opacification with probable multiple polyps or retention cysts. There is a posterior left nasal cavity polyp herniating into the nasopharynx. IMPRESSION: 1. Scattered, primarily cortical acute infarction along the left cerebral convexity, posterior watershed pattern. 2. Presumed meningioma along the lateral left frontal convexity measuring 4.1 x 1.4 cm. No neighboring brain edema. 3. Multiple small remote cerebellar infarcts. 4. Chronic sinusitis.  Left nasal cavity and nasopharynx polyp. Electronically Signed   By: Monte Fantasia M.D.   On: 07/31/2017 09:34   2D Echocardiogram  - Left ventricle: The cavity size was normal. There was mild focal basal hypertrophy of the septum. Systolic function was normal. The estimated ejection  fraction was in the range of 55% to 60%. Wall motion was normal; there were no regional wall motion abnormalities. There was an increased relative contribution of atrial contraction to ventricular filling. Doppler parameters are consistent with abnormal left ventricular relaxation (grade 1 diastolic dysfunction). - Aortic valve: Trileaflet; mildly thickened, mildly calcified leaflets. - Mitral valve: Calcified annulus. Thickening. Minimal focal calcification of the anterior leaflet. - Pulmonic valve: There was trivial regurgitation. - Pulmonary arteries: Systolic pressure could not be accurately estimated.   PHYSICAL EXAM Mental Status: lethargic, hard to stay awake. Once awake, pt A&Ox3  Language: speech is very dysarthric. Naming, repetition, fluency, and comprehension intact though some hesitancy noted. Can follow 3-stop commands Cranial Nerves: PERRL. EOMI, visual fields full, no facial weakness, facial sensation intact, hearing intact, tongue/uvula/soft palate midline, normal sternocleidomastoid and trapezius muscle strength. No evidence of tongue atrophy or fibrillations Motor: RUE 0/5 right upper extremity, 5/5 right lower extremity, 5/5 left upper and left lower.  Tone: is normal and bulk is normal Sensation- Intact to light touch bilaterally, no extinction Coordination: Finger-to-nose exam normal on L, unable to perform right Gait- deferred  ASSESSMENT/PLAN Mr. CARDEN TEEL is a 67 y.o. male with history of CAD, HTN, HLD, DM, L pontine stroke presenting with R arn weajbess.   Stroke:  left watershed infarct thought to be thrombotic secondary to  L M1 large vessel disease stenosis though cannot rule out AF  Resultant worsening of RUE hemiparesis to plegia, increased  dysarthria  Check orthostatic vitals  CT head no acute pathology. Small vessel disease. Atrophy. Opacification R sphenoid and partieal R frontal sinus  CTA head & neck no ELVO. Mod L M1 stenosis. B cavernous ICA  60% stenosis. L frontal meningioma. L nasal/nasopharynx polyp. Patching B sinusitis.  MRI  Scattered cortical & subcortical L watershed infarcts. L frontal meningioma. Mult small old infarcts. Chronic sinusitis. L nasal/nasopharynx polyp  2D Echo  EF 55-60%. No source of embolus   LE doppler negative   Repeat limited MRI pending - to look for stroke extension/new stroke - now 3rd on the list to be done  LDL 87  HgbA1c 9.9  Lovenox 40 mg sq daily for VTE prophylaxis  aspirin 325 mg daily prior to admission, now on aspirin 81 mg daily and clopidogrel 75 mg daily. Given large vessel intracranial atherosclerosis, patient should be treated with aspirin 81 mg and clopidogrel 75 mg orally every day x 3 months for secondary stroke prevention. After 3 months, change to plavix alone. Long-term dual antiplatelets are contraindicated due to risk for intracerebral hemorrhage.   Therapy recommendations:  OP OT, no PT, SLP. For reassessment given neuro worsening  Disposition:  pending   OP 30 day monitor recommended to rule out atrial fibrillation as possible source of stroke  Follow up in the office in 4 weeks. Orders placed.  Hypertension, ? Orthostatic hypotension  Check orthostatic vitals . Permissive hypertension (OK if < 220/120) but gradually normalize in 5-7 days . Long-term BP goal normotensive  Hyperlipidemia  Home meds:  lipitor 80, resumed in hospital  LDL 87, goal < 70  Continue statin at discharge  Diabetes type II  HgbA1c 9.9, goal < 7.0  Poorly Controlled. DB RN following.  SSI  CBGs  Other Stroke Risk Factors  Advanced age  Former Cigarette smoker, quit 29 years ago  UDS / ETOH level not performed   Obesity, Body mass index is 33.07 kg/m., recommend weight loss, diet and exercise as appropriate   Hx stroke/TIA -   10/2012 - L Pontine infarct  Coronary artery disease - stent 2011  Other Active Problems  L frontal meningioma. Monitor as OP  Hospital  day # 1  Burnetta Sabin, MSN, APRN, ANVP-BC, AGPCNP-BC Advanced Practice Stroke Nurse [place] for Schedule & Pager information 08/02/2017 8:07 AM   ATTENDING NOTE: I reviewed above note and agree with the assessment and plan. I have made any additions or clarifications directly to the above note. Pt was seen and examined.   Patient had neurological worsening overnight.   increased dysarthria and worsening right upper extremity weakness.  No significant right lower extremity weakness.  BP overnight seems stable.  Check orthostatic vitals, no significant orthostatic hypotension.  Repeat MRI showed extension of left MCA stroke into left BG and CR, we did the same left MCA territory, still consistent with left MCA moderate to high-grade stenosis.  This pattern resembles capsular warning syndrome.  Aspirin increased to 325, continue dual antiplatelet and Plavix for maximized medical treatment.  Continue DAPT for 3 months and then Plavix alone.  Avoid hypotension, BP goal 130-160.  Still recommend 30-day cardio event monitoring as outpatient.  Will follow.  Rosalin Hawking, MD PhD Stroke Neurology 08/02/2017 8:33 PM  I spent  35 minutes in total face-to-face time with the patient, more than 50% of which was spent in counseling and coordination of care, reviewing test results, images and medication, and discussing the diagnosis of worsening  left MCA stroke and capsular warning syndrome, treatment plan and potential prognosis. This patient's care requiresreview of multiple databases, neurological assessment, discussion with family, other specialists and medical decision making of high complexity.    To contact Stroke Continuity provider, please refer to [email or web address] After hours, contact General Neurology

## 2017-08-02 NOTE — Progress Notes (Signed)
Inpatient Diabetes Program Recommendations  AACE/ADA: New Consensus Statement on Inpatient Glycemic Control (2015)  Target Ranges:  Prepandial:   less than 140 mg/dL      Peak postprandial:   less than 180 mg/dL (1-2 hours)      Critically ill patients:  140 - 180 mg/dL  Results for Alex Cortez, Alex Cortez (MRN [id number]) as of 08/02/2017 10:54  Ref. Range 08/01/2017 07:46 08/01/2017 11:30 08/01/2017 16:11 08/01/2017 22:08 08/02/2017 06:08  Glucose-Capillary Latest Ref Range: 65 - 99 mg/dL 238 (H) 389 (H) 246 (H) 223 (H) 214 (H)    Review of Glycemic Control  Current orders for Inpatient glycemic control: Levemir 75 units QHS, Novolog 0-15 units TID with meals, Novolog 15 units TID with meals for meal coverage  Inpatient Diabetes Program Recommendations: Insulin - Basal: Please consider increasing Levemir to 78 units QHS. Correction (SSI): Please consider ordering Novolog 0-5 units QHS for bedtime correction scale. Insulin - Meal Coverage: Please consider increasing meal coverage to Novolog 20 units TID with meals.   Thank, Barnie Alderman, RN, MSN, CDE Diabetes Coordinator Inpatient Diabetes Program [phone] (Team Pager from 8am to 5pm)

## 2017-08-03 DIAGNOSIS — Z6833 Body mass index (BMI) 33.0-33.9, adult: Secondary | ICD-10-CM

## 2017-08-03 DIAGNOSIS — E7800 Pure hypercholesterolemia, unspecified: Secondary | ICD-10-CM

## 2017-08-03 DIAGNOSIS — Z91013 Allergy to seafood: Secondary | ICD-10-CM

## 2017-08-03 DIAGNOSIS — Z7902 Long term (current) use of antithrombotics/antiplatelets: Secondary | ICD-10-CM

## 2017-08-03 DIAGNOSIS — G8321 Monoplegia of upper limb affecting right dominant side: Secondary | ICD-10-CM

## 2017-08-03 DIAGNOSIS — I6349 Cerebral infarction due to embolism of other cerebral artery: Secondary | ICD-10-CM

## 2017-08-03 DIAGNOSIS — Z7982 Long term (current) use of aspirin: Secondary | ICD-10-CM

## 2017-08-03 LAB — GLUCOSE, CAPILLARY
Glucose-Capillary: 223 mg/dL — ABNORMAL HIGH (ref 65–99)
Glucose-Capillary: 340 mg/dL — ABNORMAL HIGH (ref 65–99)
Glucose-Capillary: 192 mg/dL — ABNORMAL HIGH (ref 65–99)

## 2017-08-03 MED ORDER — BENAZEPRIL HCL 40 MG PO TABS: 20.0000 mg | ORAL_TABLET | Freq: Every day | ORAL | 0 refills | Status: DC

## 2017-08-03 MED ORDER — CLOPIDOGREL BISULFATE 75 MG PO TABS: 75.0000 mg | ORAL_TABLET | Freq: Every day | ORAL | 0 refills | Status: DC

## 2017-08-03 NOTE — Care Management Note (Signed)
Case Management Note  Patient Details  Name: ANDRIC KERCE MRN: [id number] Date of Birth: [birth year]  Subjective/Objective:                    Action/Plan: Pt with recommendations for outpatient therapy. Pt and his wife would like him to attend at [facility]. Orders in Epic and information on the AVS.  Pt with recommendations for 3 in 1. Pt doesn't feel he needs but wife would like orders in case they get home and decide they need the equipment. CM provided her the orders.  Wife states she can provide 24 hour supervision at home and will provide transportation to the rehab and home when medically ready.    Expected Discharge Date:                  Expected Discharge Plan:  OP Rehab  In-House Referral:     Discharge planning Services  CM Consult  Post Acute Care Choice:    Choice offered to:     DME Arranged:    DME Agency:     HH Arranged:    [place] Agency:     Status of Service:  Completed, signed off  If discussed at [employer] of Stay Meetings, dates discussed:    Additional Comments:  Pollie Friar, RN 08/03/2017, 12:38 PM

## 2017-08-03 NOTE — Progress Notes (Signed)
Internal Medicine Attending:   I saw and examined the patient. I reviewed the resident's note and I agree with the resident's findings and plan as documented in the resident's note. As noted by Dr Ronalee Red, patient denies any new symptoms, wife feels mentation is improved today,  Repeat MRI shows extension of infarctions,  Neurology rec noted, ASA 325mg  and clopidogrel 75mg  x3 months then plavix alone.  Given worsened deficit will have PT and OT reeval today before final recommendations, discharge at earliest this afternoon but more likely tomorrow.

## 2017-08-03 NOTE — Progress Notes (Addendum)
Occupational Therapy Treatment Patient Details Name: Alex Cortez MRN: [id number] DOB: April 26, 1950 Today's Date: 08/03/2017    History of present illness 67 y.o. male past medical history of a left pontine stroke with no residual deficits, coronary artery disease, poorly controlled diabetes, hypertension, hyperlipidemia, presented to the emergency room for evaluation of right-sided weakness that started around 6 PM on 07/30/2017. imaging revealed Scattered, primarily cortical acute infarction along the left cerebral convexity, posterior watershed pattern, Presumed meningioma along the lateral left frontal convexity measuring 4.1 x 1.4 cm   OT comments  Pt progressing towards OT goals, handoff from PT for start of OT tx session. Of note Pt requiring increased assist this session and demonstrating additional functional deficits than initial presentation on OT eval on 5/20. Pt requiring MinA for hallway level functional mobility, demonstrating increased inattention to R side during this session, requiring mod-max cues to avoid obstacles in R visual field and to attend to R side of body. Pt requiring increased time and multimodal cues for processing and following 1 step commands.  Pt also with noted decreased functional use of RUE compared to initial eval. Educated both pt and pt's spouse regarding self-ROM, positioning and importance of incorporating use of RUE into functional tasks. Feel Pt is now an excellent candidate for CIR level services. If pt/pt's spouse choosing to return home instead, recommend pt have 24hr assist/supervision and recommend follow up outpatient neuro OT services to maximize his RUE function, vision, safety and independence with ADLs and mobility. Will continue to follow acutely to progress pt towards established OT goals and to facilitate safe disp plan at time of discharge.    Follow Up Recommendations  CIR;Supervision/Assistance - 24 hour;Other (comment)(if pt choosing to go  home, recommend outpt OT )    Equipment Recommendations  3 in 1 bedside commode          Precautions / Restrictions Precautions Precautions: Fall Restrictions Weight Bearing Restrictions: No       Mobility Bed Mobility               General bed mobility comments: OOB with PT   Transfers Overall transfer level: Needs assistance   Transfers: Sit to/from Stand Sit to Stand: Supervision         General transfer comment: cues for R UE positioning for safety of R UE    Balance Overall balance assessment: Needs assistance   Sitting balance-Leahy Scale: Good       Standing balance-Leahy Scale: Fair Standing balance comment: minguard for static standing; pt requires close minguard-minA for dynamic mobility                            ADL either performed or assessed with clinical judgement   ADL Overall ADL's : Needs assistance/impaired     Grooming: Moderate assistance;Standing Grooming Details (indicate cue type and reason): pt attempting to incorporate RUE into task without requiring cues, does require assist to locate correct items in R visual field for task completion                          Tub/ Shower Transfer: Tub transfer;Moderate assistance;Ambulation Tub/Shower Transfer Details (indicate cue type and reason): pt requires step-by-step  cues to complete tub transfer and modA to safely step into/out of tub with LUE supported Functional mobility during ADLs: Minimal assistance General ADL Comments: pt completing tub transfer, functional mobility, standing grooming ADLs, and  seated UE exercises; educated in self ROM and further educated on attending to [place] during functional tasks as well as while seated      Vision   Vision Assessment?: Yes Alignment/Gaze Preference: Gaze left Additional Comments: pt requires max cues to locate items in R visual field and to attend to R arm during functional tasks; requires assist to avoid bumping  into items on R side during mobility               Cognition Arousal/Alertness: Awake/alert Behavior During Therapy: WFL for tasks assessed/performed Overall Cognitive Status: Impaired/Different from baseline Area of Impairment: Memory;Awareness                   Current Attention Level: Selective Memory: Decreased short-term memory Following Commands: Follows one step commands with increased time Safety/Judgement: Decreased awareness of deficits Awareness: Emergent Problem Solving: Requires verbal cues;Requires tactile cues;Slow processing General Comments: pt requiring increased time and multimodal cues to follow one step commands, continues to demonstrate some confusion and difficulty with word finding         Exercises General Exercises - Upper Extremity Shoulder Flexion: Self ROM;10 reps;Right;Seated Shoulder Horizontal ABduction: Self ROM;10 reps;Right;Seated Shoulder Horizontal ADduction: Self ROM;10 reps;Right;Seated Elbow Flexion: Self ROM;Right;Seated Elbow Extension: Self ROM;Right;10 reps;Seated Wrist Flexion: Self ROM;Right Wrist Extension: Self ROM;Right;10 reps;Seated Digit Composite Flexion: 10 reps;Seated Composite Extension: Self ROM;Right;Seated Other Exercises Other Exercises: reviewed RUE positioning with pt/pt's spouse                 Pertinent Vitals/ Pain       Pain Assessment: No/denies pain                                                          Frequency  Min 3X/week        Progress Toward Goals  OT Goals(current goals can now be found in the care plan section)  Progress towards OT goals: Progressing toward goals  Acute Rehab OT Goals Patient Stated Goal: to get better OT Goal Formulation: With patient Time For Goal Achievement: 08/15/17 Potential to Achieve Goals: Good  Plan Discharge plan needs to be updated                    AM-PAC PT "6 Clicks" Daily Activity     Outcome  Measure   Help from another person eating meals?: None Help from another person taking care of personal grooming?: A Little Help from another person toileting, which includes using toliet, bedpan, or urinal?: A Little Help from another person bathing (including washing, rinsing, drying)?: A Little Help from another person to put on and taking off regular upper body clothing?: None Help from another person to put on and taking off regular lower body clothing?: A Little 6 Click Score: 20    End of Session Equipment Utilized During Treatment: Gait belt  OT Visit Diagnosis: Other symptoms and signs involving the nervous system (R29.898);Other symptoms and signs involving cognitive function   Activity Tolerance Patient tolerated treatment well   Patient Left in chair;with call bell/phone within reach;with chair alarm set;with family/visitor present   Nurse Communication Mobility status;Precautions        Time: 6734-1937 OT Time Calculation (min): 25 min  Charges: OT General Charges $OT Visit: 1 Visit OT  Treatments $Self Care/Home Management : 8-22 mins $Therapeutic Activity: 8-22 mins  Lou Cal, [place] Pager [phone] 08/03/2017    Raymondo Band 08/03/2017, 12:09 PM

## 2017-08-03 NOTE — Progress Notes (Addendum)
Physical Therapy Treatment Patient Details Name: Alex Cortez MRN: [id number] DOB: 1950/07/16 Today's Date: 08/03/2017    History of Present Illness 67 y.o. male past medical history of a left pontine stroke with no residual deficits, coronary artery disease, poorly controlled diabetes, hypertension, hyperlipidemia, presented to the emergency room for evaluation of right-sided weakness that started around 6 PM on 07/30/2017. imaging revealed Scattered, primarily cortical acute infarction along the left cerebral convexity, posterior watershed pattern, Presumed meningioma along the lateral left frontal convexity measuring 4.1 x 1.4 cm    PT Comments    Patient grossly required supervision for transfers and min guard for gait however did need increased assist at times due to R side inattention. Pt runs into objects on R side when navigating environment without mod/max multimodal cues. Pt did not attempt to use R UE or protect UE throughout session. Pt with word finding difficulties and required cues for reading signs when in hallway. Pt will benefit from CIR level therapies prior to d/c home with wife's assistance.   Follow Up Recommendations  CIR     Equipment Recommendations  None recommended by PT    Recommendations for Other Services       Precautions / Restrictions Precautions Precautions: Fall Restrictions Weight Bearing Restrictions: No    Mobility  Bed Mobility               General bed mobility comments: pt OOB in chair upon arrival  Transfers Overall transfer level: Needs assistance Equipment used: None Transfers: Sit to/from Stand Sit to Stand: Supervision         General transfer comment: supervision for safety; pt did not attempt to use R UE however required use of L UE   Ambulation/Gait Ambulation/Gait assistance: Min guard;Min assist   Assistive device: None Gait Pattern/deviations: Step-through pattern;Decreased stride length;Drifts  right/left Gait velocity: decreased   General Gait Details: pt required min A at times due to R side inattention and running into objects/wall without verbal and tactile cues for navigating environment   Stairs Stairs: Yes Stairs assistance: Min guard Stair Management: One rail Left;Step to pattern;Forwards Number of Stairs: 10 General stair comments: close min guard for negotiating stairs with cues for safety and use of L side hand rail; initially pt instructed to use L side hand rail however went to R side of stairs and reached out for L side rail   Wheelchair Mobility    Modified Rankin (Stroke Patients Only)       Balance Overall balance assessment: Needs assistance Sitting-balance support: No upper extremity supported;Feet supported Sitting balance-Leahy Scale: Good       Standing balance-Leahy Scale: Fair Standing balance comment: minguard for static standing; pt requires close minguard-minA for dynamic mobility                             Cognition Arousal/Alertness: Awake/alert Behavior During Therapy: WFL for tasks assessed/performed Overall Cognitive Status: Impaired/Different from baseline Area of Impairment: Memory;Awareness;Safety/judgement;Problem solving                   Current Attention Level: Selective Memory: Decreased short-term memory Following Commands: Follows one step commands with increased time Safety/Judgement: Decreased awareness of deficits Awareness: Emergent Problem Solving: Requires verbal cues;Requires tactile cues;Slow processing(cues needed to direct atttention to R side) General Comments: pt with word finding difficulties and cueing for reading signs in the hallway      Exercises General Exercises -  Upper Extremity Shoulder Flexion: Self ROM;10 reps;Right;Seated Shoulder Horizontal ABduction: Self ROM;10 reps;Right;Seated Shoulder Horizontal ADduction: Self ROM;10 reps;Right;Seated Elbow Flexion: Self  ROM;Right;Seated Elbow Extension: Self ROM;Right;10 reps;Seated Wrist Flexion: Self ROM;Right Wrist Extension: Self ROM;Right;10 reps;Seated Digit Composite Flexion: 10 reps;Seated Composite Extension: Self ROM;Right;Seated Other Exercises Other Exercises: reviewed RUE positioning with pt/pt's spouse     General Comments        Pertinent Vitals/Pain Pain Assessment: No/denies pain    Home Living                      Prior Function            PT Goals (current goals can now be found in the care plan section) Acute Rehab PT Goals Patient Stated Goal: to get better Progress towards PT goals: Progressing toward goals    Frequency    Min 3X/week      PT Plan Discharge plan needs to be updated    Co-evaluation              AM-PAC PT "6 Clicks" Daily Activity  Outcome Measure  Difficulty turning over in bed (including adjusting bedclothes, sheets and blankets)?: None Difficulty moving from lying on back to sitting on the side of the bed? : None Difficulty sitting down on and standing up from a chair with arms (e.g., wheelchair, bedside commode, etc,.)?: A Little Help needed moving to and from a bed to chair (including a wheelchair)?: A Little Help needed walking in hospital room?: A Little Help needed climbing 3-5 steps with a railing? : A Little 6 Click Score: 20    End of Session Equipment Utilized During Treatment: Gait belt Activity Tolerance: Patient tolerated treatment well Patient left: Other (comment)(pt with OT end of session in therapy gym) Nurse Communication: Mobility status PT Visit Diagnosis: Unsteadiness on feet (R26.81);Other symptoms and signs involving the nervous system (R29.898)     Time: 1005-1020 PT Time Calculation (min) (ACUTE ONLY): 15 min  Charges:  $Gait Training: 8-22 mins                    G Codes:       Earney Navy, PTA Pager: [phone]     Darliss Cheney 08/03/2017, 2:02 PM

## 2017-08-03 NOTE — Progress Notes (Signed)
  Speech Language Pathology Treatment: Cognitive-Linquistic  Patient Details Name: Alex Cortez MRN: [id number] DOB: [birth year] Today's Date: 08/03/2017 Time: 0850-0905 SLP Time Calculation (min) (ACUTE ONLY): 15 min  Assessment / Plan / Recommendation Clinical Impression  Skilled treatment session focused on communication goals. Pt with increased right facial weakness and obvious word finding deficits. Pt's speech intelligibility is ~ 75% at the conversation level. SLP provided education to pt and wife on intelligibility strategies (using Big movements, using Loud speech) and on strategies to increase communicate abilities such as multiple choices and yes/no questions. All questions answered to pt and wife's satisfaction.    HPI HPI: 67 y.o. male past medical history of a left pontine stroke with no residual deficits, coronary artery disease, poorly controlled diabetes, hypertension, hyperlipidemia, presented to the emergency room for evaluation of right-sided weakness that started around 6 PM on 07/30/2017. imaging revealed Scattered, primarily cortical acute infarction along the left cerebral convexity, posterior watershed pattern, Presumed meningioma along the lateral left frontal convexity measuring 4.1 x 1.4 cm      SLP Plan  Continue with current plan of care       Recommendations                   Follow up Recommendations: Outpatient SLP SLP Visit Diagnosis: Dysarthria and anarthria (R47.1) Attention and concentration deficit following: Cerebral infarction Plan: Continue with current plan of care       GO                Alex Cortez 08/03/2017, 10:17 AM

## 2017-08-03 NOTE — Progress Notes (Signed)
Inpatient Rehabilitation Admissions Coordinator  I contacted Warren Lacy, PTA to discuss recommendations for an inpt rehab admit today. I then met with patient and his wife to discuss their preferences for rehab venue. Wife states she was present for therapy today and fells very comfortable with outpt therapy which has been arranged. She can provide 24/7 assist at d/c and is aware of his r neglect issues. I contacted Attending service that OP is recommended, not CIR. Previous CIR consult on 5/20 by Dr. Posey Pronto. I updated RN CM and we will sign off.   Danne Baxter, RN, MSN Rehab Admissions Coordinator [phone] 08/03/2017 2:41 PM

## 2017-08-03 NOTE — Progress Notes (Signed)
Subjective:  Alex Cortez was sitting in the chair this morning. Wife at bedside. Wife reports his word finding difficulties and mentation are slightly improved compared to yesterday. Patient continues to have RUE weakness, similar to yesterday. No LE weakness. Discussed findings of repeat MRI and importance of medication adherence. Plan for therapy re-evaluation and will touch base with Neuro regarding final recs. Patient is eager to go home.  Objective:  Vital signs in last 24 hours: Vitals:   08/02/17 1717 08/02/17 2005 08/03/17 0010 08/03/17 0404  BP: (!) 151/89 (!) 147/73 (!) 155/83 (!) 149/80  Pulse: 72 75 70 73  Resp: 20 18 18 18   Temp: 98.2 F (36.8 C) 98 F (36.7 C) 98.6 F (37 C) 98.4 F (36.9 C)  TempSrc: Oral Oral Oral Oral  SpO2: 97% 97% 98% 98%  Weight:      Height:       GEN: Lying in bed in NAD. Oriented to person and place (not to year) CV: NR & RR, no m/r/g PULM: No increased work of breathing on RA MSK: No LE edema NEURO: Very limited movement of distal muscles of right upper extremity. 0/5 grip strength. Some movement of proximal muscles of RUE. 5/5 LUE strength, 5/5 BLE strength. Normal sensation to light touch in BUE and BLE. Very mild left lower facial weakness.. Expressive aphasia. Cognitive slowing. No object naming deficits.   Assessment/Plan:  Active Problems:   Hyperlipidemia   Cerebral embolism with cerebral infarction   CVA (cerebral vascular accident) ([place])   History of CVA with residual deficit   Coronary artery disease involving native coronary artery of native heart without angina pectoris   Essential hypertension   Diabetes mellitus type 2 in obese [facility])  Alex Cortez is a 67yo male with PMH of L pontine stroke in 2014 (with mild residual RUE weakness), CAD s/p DES to LAD in 2011, diabetes, and HTN who is admitted for multiple left cortical infarctions in a watershed distribution, secondary to thrombotic disease. Due to worsened deficits  yesterday, repeat brain MRI was obtained, which shows extension/worsening of infarction in left cortex. Orthostatics negative for hypoperfusion when sitting/standing. Neuro recommends no invasive intervention and continued medical management.  Multiple left cortical acute infarctions Left M1 segment stenosis 60% bilateral cavernous ICA narrowing Repeat MRI brain yesterday obtained due to worsened deficits, shows extension/increase in size of previously imaged infarctions in left cortex. Most likely etiology is thrombotic disease. Neurology has evaluated and plan for medical management with DAPT x3 months and strict BP control (sBP 130s-160s). Patient's neuro exam this morning is about the same as yesterday with little movement of distal RUE muscles. - Neurology consulted; appreciate their assistance - sBP goal 130-160 - Aspirin 325mg  daily and clopidogrel 75mg  daily x3 months, then change to plavix alone - Continue home atorvastatin 80mg  QHS - Outpatient 30-day monitor - F/u with Neurology in 4 weeks - Will have PT and OT re-eval prior to possible discharge today  Incidental left frontal meningioma, stable No symptoms of intracranial mass effect. - Outpatient Neurology follow-up  Hx of HTN Hx of CAD s/p DES to LAD in 2011 Home medications include amlodipine 10mg  daily, benazepril 40mg  BID, carvedilol 25mg  BID, and imdur 30mg  daily. - sBP goal 130-160 - Hold home amlodipine, benazepril, carvedilol, and imdur  Diabetes Home medications include metformin 500mg  BID, Levemir 100 units QHS, and Apidra 30-36u TID with meals. Recent A1c 5/2 was 9.9. - Continue Levemir 75 units QHS - Novolog 20 units TID WC -  SSI-R TID WC & HS - CBG monitoring TID WC & QHS  Dispo: Anticipated discharge in approximately 1-2 day(s).   Colbert Ewing, MD 08/03/2017, 6:30 AM Pager: Mamie Nick [phone]

## 2017-08-03 NOTE — Progress Notes (Signed)
Patient discharged home. Discharge instructions were reviewed with the patient and wife. Wife and patient verbalized understanding.

## 2017-08-03 NOTE — Progress Notes (Addendum)
STROKE TEAM PROGRESS NOTE   INTERVAL HISTORY Wife at bedside. Feels he is moving and talking more, better than yesterday, but not as good as when he first got here. Understands he had a new stroke, same distribution and etiology as previous events this week. tx unchanged. OT now recommends CIR. PT re-eval pending. Await input. anticipate return home with OP therapies.  Wife states he walked around the unit this am without difficulty.   Vitals:   08/02/17 2005 08/03/17 0010 08/03/17 0404 08/03/17 0836  BP: (!) 147/73 (!) 155/83 (!) 149/80 (!) 143/86  Pulse: 75 70 73 82  Resp: 18 18 18 17   Temp: 98 F (36.7 C) 98.6 F (37 C) 98.4 F (36.9 C) 97.7 F (36.5 C)  TempSrc: Oral Oral Oral Oral  SpO2: 97% 98% 98% 94%  Weight:      Height:        CBC:  Recent Labs  Lab 07/31/17 0555 07/31/17 0601  WBC 6.0  --   NEUTROABS 3.1  --   HGB 14.2 15.3  HCT 45.0 45.0  MCV 83.2  --   PLT 221  --     Basic Metabolic Panel:  Recent Labs  Lab 07/31/17 0555 07/31/17 0601 08/01/17 0243  NA 141 143 141  K 4.5 4.3 3.8  CL 105 104 105  CO2 26  --  25  GLUCOSE 197* 193* 216*  BUN 14 15 11   CREATININE 1.12 0.90 0.90  CALCIUM 9.3  --  9.2   Lipid Panel:     Component Value Date/Time   CHOL 138 08/01/2017 0243   TRIG 98 08/01/2017 0243   HDL 31 (L) 08/01/2017 0243   CHOLHDL 4.5 08/01/2017 0243   VLDL 20 08/01/2017 0243   LDLCALC 87 08/01/2017 0243   HgbA1c:  Lab Results  Component Value Date   HGBA1C 9.9 (H) 07/12/2017   Urine Drug Screen: No results found for: LABOPIA, COCAINSCRNUR, LABBENZ, AMPHETMU, THCU, LABBARB  Alcohol Level No results found for: [facility]  IMAGING Mr Brain Limited Wo Contrast  Result Date: 08/02/2017 CLINICAL DATA:  67 y/o  M; stroke for follow-up. EXAM: MRI HEAD WITHOUT CONTRAST TECHNIQUE: Axial DWI, coronal DWI, axial SWAN sequences were acquired. COMPARISON:  07/31/2017 MRI of the head. FINDINGS: Brain: New small area of reduced diffusion involving the  upper aspect of the left putamen extending into the corona radiata (series 4, image 20) compatible with acute infarction. Multiple small chronic infarctions throughout the left watershed distribution, predominantly posteriorly, are stable to minimally increased in size when compared with the prior MRI brain. The greatest change is in the left posterior frontal lobe where previously punctate lesions now measure 11 mm in conglomerate (series 3, image 41). Within the previous larger confluent focus of infarction in the left parietal lobe there is curvilinear susceptibility hypointensity compatible with petechial hemorrhage. Stable left lateral frontal convexity extra-axial mass, presumably meningioma. No extra-axial collection, hydrocephalus, or herniation. IMPRESSION: 1. New acute infarction within the left lateral putamen extending into corona radiata. 2. Multiple small late acute/early subacute infarctions in the left watershed distribution are stable to minimally increased in size in comparison with the prior MRI of the brain. 3. Interval petechial hemorrhage within the larger focus infarction in the left parietal lobe. 4. Stable left frontal convexity extra-axial mass, presumably meningioma. These results will be called to the ordering clinician or representative by the Radiologist Assistant, and communication documented in the PACS or zVision Dashboard. Electronically Signed   By: Mia Creek  Furusawa-Stratton M.D.   On: 08/02/2017 18:00   2D Echocardiogram  - Left ventricle: The cavity size was normal. There was mild focal basal hypertrophy of the septum. Systolic function was normal. The estimated ejection fraction was in the range of 55% to 60%. Wall motion was normal; there were no regional wall motion abnormalities. There was an increased relative contribution of atrial contraction to ventricular filling. Doppler parameters are consistent with abnormal left ventricular relaxation (grade 1 diastolic  dysfunction). - Aortic valve: Trileaflet; mildly thickened, mildly calcified leaflets. - Mitral valve: Calcified annulus. Thickening. Minimal focal calcification of the anterior leaflet. - Pulmonic valve: There was trivial regurgitation. - Pulmonary arteries: Systolic pressure could not be accurately estimated.   PHYSICAL EXAM Mental Status: A&Ox3.  Language: speech is very dysarthric. Can name key, cellphone. Difficulty with lipstick, expressing thoughts. Repetition ok. decreased fluency. Can show 2 fingers but difficulty with complex commands. hesitancy noted.  Cranial Nerves: PERRL. EOMI, visual fields full, no facial weakness, facial sensation intact, hearing intact, tongue/uvula/soft palate midline, normal sternocleidomastoid and trapezius muscle strength. No evidence of tongue atrophy or fibrillations Motor: RUE 1/5 right upper extremity, 5/5 right lower extremity, 5/5 left upper and left lower.  Tone: is normal and bulk is normal Sensation- Intact to light touch bilaterally, no extinction Coordination: Finger-to-nose exam normal on L, unable to perform right Gait- deferred  ASSESSMENT/PLAN Alex Cortez is a 67 y.o. male with history of CAD, HTN, HLD, DM, L pontine stroke presenting with R arn weajbess.   Stroke:  left watershed infarcts with additional new L putamen into corona radiata infarct in hospital. All infarcts felt to be thrombotic secondary to  L M1 large vessel disease stenosis though cannot rule out AF (though no AF documented on tele)  Resultant worsening of RUE hemiparesis to plegia, increased dysarthria in hospital  CT head no acute pathology. Small vessel disease. Atrophy. Opacification R sphenoid and partieal R frontal sinus  CTA head & neck no ELVO. Mod L M1 stenosis. B cavernous ICA 60% stenosis. L frontal meningioma. L nasal/nasopharynx polyp. Patching B sinusitis.  MRI  Scattered cortical & subcortical L watershed infarcts. L frontal meningioma. Mult  small old infarcts. Chronic sinusitis. L nasal/nasopharynx polyp  2D Echo  EF 55-60%. No source of embolus   LE doppler negative   Repeat limited MRI 5/21 new L lateral putamen extending into CR infarct. Mult small acute/subacute L watershed infarct. Interval petechial hmg in L parietal lobe (expected finding, not indication to change tx)  Stable L frontal meningioma  LDL 87  HgbA1c 9.9  Lovenox 40 mg sq daily for VTE prophylaxis  aspirin 325 mg daily prior to admission, now on aspirin 81 mg daily and clopidogrel 75 mg daily. Given large vessel intracranial atherosclerosis, patient should be treated with aspirin 81 mg and clopidogrel 75 mg orally every day x 3 MONTHS for secondary stroke prevention. After 3 months, change to plavix alone. Long-term dual antiplatelets are contraindicated due to risk for intracerebral hemorrhage.   Therapy recommendations:  OT now recommends CIR, OP PT, OP SLP. PT to reassess given neuro worsening  Disposition:  pending (discussed with rehab adm coordinator - anticipate home w/ wife. Await PT input)  OP 30 day monitor recommended to rule out atrial fibrillation as possible source of stroke  Follow up in the office in 4 weeks. Orders placed.  Hypertension, ? Orthostatic hypotension  Stable orthostatic vitals . Permissive hypertension (OK if < 220/120) but gradually normalize in 5-7 days .  Long-term BP goal normotensive  Hyperlipidemia  Home meds:  lipitor 80, resumed in hospital  LDL 87, goal < 70  Continue statin at discharge  Diabetes type II  HgbA1c 9.9, goal < 7.0  Poorly Controlled. DB RN following.  SSI  CBGs  Other Stroke Risk Factors  Advanced age  Former Cigarette smoker, quit 29 years ago  UDS / ETOH level not performed   Obesity, Body mass index is 33.07 kg/m., recommend weight loss, diet and exercise as appropriate   Hx stroke/TIA -   10/2012 - L Pontine infarct  Coronary artery disease - stent 2011  Other Active  Problems  L frontal meningioma. Monitor as OP  Hospital day # 2  Burnetta Sabin, MSN, APRN, ANVP-BC, AGPCNP-BC [facility] Stroke Nurse [place] for Schedule & Pager information 08/03/2017 8:45 AM   ATTENDING NOTE: I reviewed above note and agree with the assessment and plan. I have made any additions or clarifications directly to the above note. Pt was seen and examined.   Pt neuro stable overnight. Dysarthria improved as well right UE weakness. RUE proximal 4-/5 but distal still 1-2/5. Recommend to continue ASA 325 and plavix for 3 months and then plavix alone. Also recommend 30 day cardiac event monitoring as outpt to rule out afib. PT/OT recommended outpt PT/OT/speech.   Neurology will sign off. Please call with questions. Pt will follow up with stroke clinic NP at [facility] in about 4 weeks. Thanks for the consult.  Rosalin Hawking, MD PhD Stroke Neurology 08/03/2017 10:17 PM     To contact Stroke Continuity provider, please refer to [email or web address] After hours, contact General Neurology

## 2017-08-04 NOTE — Consult Note (Signed)
            [facility] Primary Care Navigator  08/04/2017  Alex Cortez [birth year] [id number]   Wentto seepatient at the bedsideto identify possible discharge needs but he was alreadydischargedper staff report.  Patient was discharged home with Outpatient therapy for rehab.  Per chart review, patient was admitted for acute CVA- Cerebral embolism with cerebral infarction  Primary care provider's officeis listed as providing transition of care (TOC).  Patient has a discharge instruction to follow-up with primary care provider in 1 week and neurology in 4 weeks.  Noted with order for EMMI Stroke calls to follow-up recovery at home already in place.   For additional questions please contact:  Edwena Felty A. Stefania Goulart, BSN, RN-BC [facility] PRIMARY CARE Navigator Cell: [phone]

## 2017-08-09 ENCOUNTER — Encounter (HOSPITAL_COMMUNITY): Payer: Self-pay | Admitting: Occupational Therapy

## 2017-08-09 ENCOUNTER — Ambulatory Visit (HOSPITAL_COMMUNITY)
Payer: Medicare Other | Attending: Student in an Organized Health Care Education/Training Program | Admitting: Occupational Therapy

## 2017-08-09 ENCOUNTER — Other Ambulatory Visit: Payer: Self-pay

## 2017-08-09 DIAGNOSIS — R2689 Other abnormalities of gait and mobility: Secondary | ICD-10-CM | POA: Insufficient documentation

## 2017-08-09 DIAGNOSIS — I69351 Hemiplegia and hemiparesis following cerebral infarction affecting right dominant side: Secondary | ICD-10-CM

## 2017-08-09 DIAGNOSIS — R29898 Other symptoms and signs involving the musculoskeletal system: Secondary | ICD-10-CM | POA: Diagnosis not present

## 2017-08-09 DIAGNOSIS — R278 Other lack of coordination: Secondary | ICD-10-CM | POA: Diagnosis not present

## 2017-08-09 NOTE — Patient Instructions (Signed)
Complete all exercises 10X each, 2-3X/day.    1) Towel crunch Place a small towel on a firm table top. Flatten out the towel and then place your hand on one end of it.  Next, flex your fingers 2-5 (index finger through pinky finger) as you pull the towel towards your hand.    2) Digit composite flexion/adduction (make a fist) Hold your hand up as shown. Open and close your hand into a fist and repeat. If you cannot make a full fist, then make a partial fist.    3) Thumb/finger opposition Touch the tip of the thumb to each fingertip one by one. Extend fingers fully after they are touched.      4) Finger Taps Start with the hand flat and fingers slightly spread.  One at a time, starting with the thumb, lift each finger up separately.      5) Digit Abduction/Adduction Hold hand palm down flat on table. Spread your fingers apart and back together.     Home Exercises Program Theraputty Exercises  Do the following exercises 2 times a day using your affected hand.  1. Roll putty into a ball.  2. Make into a pancake.  3. Roll putty into a roll.  4. Pinch along log with first finger and thumb.   5. Make into a ball.  6. Roll it back into a log.   7. Pinch using thumb and side of first finger.  8. Roll into a ball, then flatten into a pancake.  9. Using your fingers, make putty into a mountain.

## 2017-08-09 NOTE — Therapy (Addendum)
[place], [place], [address] Phone: [phone]   Fax:  [phone]  Occupational Therapy Evaluation  Patient Details  Name: Alex Cortez MRN: [id number] Date of Birth: June 18, 1950 Referring Provider: Lalla Brothers, MD   Encounter Date: 08/09/2017  OT End of Session - 08/09/17 1053    Visit Number  1    Number of Visits  16    Date for OT Re-Evaluation  10/08/17 mini-reassessment 09/10/2017    Authorization Type  1) Medicare A & B; 2) BCBS    Authorization Time Period  BCBS: 75 visits/year; 08/18/17: Discussed with speech and PT, speech will use 3, OT will use 35, and PT will use 35   Authorization - Visit Number  1    Authorization - Number of Visits  35   OT Start Time  0940    OT Stop Time  1027    OT Time Calculation (min)  47 min    Activity Tolerance  Patient tolerated treatment well    Behavior During Therapy  WFL for tasks assessed/performed       Past Medical History:  Diagnosis Date  . CAD (coronary artery disease)    nonobstructive CAD/patent LAD stent site; EF 65%, 08/2011; NSTEMI/DES LAD, 6/20 11  . Chest pain   . Diabetes mellitus    insulin-dependent  . Dyslipidemia   . HTN (hypertension)   . 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 Stroke [facility]) 10/22/2012   Left inferior pons    Past Surgical History:  Procedure Laterality Date  . CORONARY ANGIOPLASTY WITH STENT PLACEMENT      There were no vitals filed for this visit.  Subjective Assessment - 08/09/17 1030    Subjective   S: I can't use this arm for anything.     Patient is accompained by:  Family member wife    Pertinent History  Pt is a 67 y/o male s/p left CVA on 07/30/17 presenting with right hemiparesis. Pt received OT services while in acute care at [facility], discharged home with wife assisting with ADLs. Pt was referred to occupational therapy by Dr. Lalla Brothers.     Special Tests  FOTO 57/100    Patient Stated Goals  To be able  to use my right arm.     Currently in Pain?  No/denies        [facility] OT Assessment - 08/09/17 0931      Assessment   Medical Diagnosis  s/p L CVA    Referring Provider  Lalla Brothers, MD    Onset Date/Surgical Date  07/30/17    Hand Dominance  Right    Next MD Visit  08/31/2017    Prior Therapy  Acute care      Precautions   Precautions  None      Restrictions   Weight Bearing Restrictions  No      Balance Screen   Has the patient fallen in the past 6 months  No    Has the patient had a decrease in activity level because of a fear of falling?   No    Is the patient reluctant to leave their home because of a fear of falling?   No      Prior Function   Level of Independence  Independent    Vocation  Part time employment as needed, 1-2x/week    Vocation Requirements  delivering cars-driving a lot    Leisure  golf, Haematologist  ADL   ADL comments  pt is having difficulty with eating using right hand, holding objects, using hands together for coordination tasks, grooming-shaving, dressing, bathing, and using RUE as dominant for ADLs      Written Expression   Dominant Hand  Right      Cognition   Overall Cognitive Status  Within Functional Limits for tasks assessed      Observation/Other Assessments   Focus on Therapeutic Outcomes (FOTO)   57/100      Coordination   9 Hole Peg Test  Right;Left    Right 9 Hole Peg Test  5'10"    Left 9 Hole Peg Test  46.08"      ROM / Strength   AROM / PROM / Strength  Strength;AROM;PROM      AROM   Overall AROM Comments  Assessed seated, er/IR adducted    AROM Assessment Site  Shoulder;Wrist    Right/Left Shoulder  Right    Right Shoulder Flexion  130 Degrees    Right Shoulder ABduction  140 Degrees    Right Shoulder Internal Rotation  90 Degrees    Right Shoulder External Rotation  40 Degrees    Right/Left Wrist  Right    Right Wrist Extension  15 Degrees    Right Wrist Flexion  22 Degrees    Right Wrist Radial Deviation  20  Degrees WNL    Right Wrist Ulnar Deviation  20 Degrees WNL      PROM   PROM Assessment Site  Wrist    Right/Left Wrist  Right    Right Wrist Extension  50 Degrees WNL    Right Wrist Flexion  50 Degrees WNL    Right Wrist Radial Deviation  20 Degrees WNL    Right Wrist Ulnar Deviation  20 Degrees WNL      Strength   Overall Strength Comments  Assessed seated, er/IR adducted    Strength Assessment Site  Shoulder;Elbow;Wrist;Hand    Right/Left Shoulder  Right    Right Shoulder Flexion  4/5    Right Shoulder ABduction  4-/5    Right Shoulder Internal Rotation  3/5    Right Shoulder External Rotation  3/5    Right/Left Elbow  Right    Right Elbow Flexion  3+/5    Right Elbow Extension  3+/5    Right/Left Wrist  Right    Right Wrist Flexion  2+/5    Right Wrist Extension  3-/5    Right Wrist Radial Deviation  3/5    Right Wrist Ulnar Deviation  3/5    Right/Left hand  Right;Left    Right Hand Gross Grasp  Functional    Right Hand Grip (lbs)  34    Right Hand Lateral Pinch  15 lbs    Right Hand 3 Point Pinch  10 lbs    Left Hand Gross Grasp  Functional    Left Hand Grip (lbs)  58    Left Hand Lateral Pinch  20 lbs    Left Hand 3 Point Pinch  15 lbs                      OT Education - 08/09/17 1051    Education provided  Yes    Education Details  fine motor coordination, grip/pinch strengthening with theraputty    Person(s) Educated  Patient    Methods  Explanation;Demonstration;Handout    Comprehension  Verbalized understanding;Returned demonstration       OT Short  Term Goals - 08/09/17 1119      OT SHORT TERM GOAL #1   Title  Pt will be provided with and educated on HEP to promote improved use of RUE as dominant during B/IADLs.     Time  4    Period  Weeks    Status  New    Target Date  09/08/17      OT SHORT TERM GOAL #2   Title  Pt will improve RUE strength to 4/5 to improve ability to perform dressing tasks using RUE as dominant.     Time  4     Period  Weeks    Status  New      OT SHORT TERM GOAL #3   Title  Pt will improve right grip strength by 10# and pinch strength by 3# to improve ability to hold eating utensils.     Time  4    Period  Weeks    Status  New      OT SHORT TERM GOAL #4   Title  Pt will improve right coordination required for eating tasks by completing 9 hole peg test in 2.5 minutes or less.     Time  4    Period  Weeks    Status  New        OT Long Term Goals - 08/09/17 1125      OT LONG TERM GOAL #1   Title  Pt will return to highest level of functioning and independence using RUE as dominant for B/ADL tasks.     Time  8    Period  Weeks    Status  New    Target Date  10/08/17      OT LONG TERM GOAL #2   Title  Pt will improve RUE strength to 4+/5 or greater to improve ability to perform yardwork tasks using RUE as dominant.     Time  8    Period  Weeks    Status  New      OT LONG TERM GOAL #3   Title  Pt will improve right grip strength by 18# and pinch strength by 5# to improve ability to hold and carry objects with right hand.     Time  8    Period  Weeks    Status  New      OT LONG TERM GOAL #4   Title  Pt will improve right fine motor coordination required for grooming tasks such as shaving by completing 9 hole peg test in under 1 minute.     Time  4    Period  Weeks    Status  New            Plan - 08/09/17 1054    Clinical Impression Statement  A: Pt is a 67 y/o male s/p left CVA on 07/30/17 presenting with right hemiparesis limiting ability to use RUE as dominant during B/IADL and limiting independence in task completion. Wife and family currently providing assistance as needed at home, pt completing many tasks with LUE.     Occupational Profile and client history currently impacting functional performance  Pt was independent in B/IADLs including working and driving prior to CVA, is motivated to return to prior level of independence using RUE as dominant    Occupational  performance deficits (Please refer to evaluation for details):  ADL's;IADL's;Rest and Sleep;Work;Leisure;Social Participation    Rehab Potential  Good    OT Frequency  2x /  week    OT Duration  8 weeks    OT Treatment/Interventions  Self-care/ADL training;Therapeutic exercise;Ultrasound;Neuromuscular education;Manual Therapy;Therapeutic activities;Electrical Stimulation;Moist Heat;Passive range of motion;Patient/family education    Plan  P: Pt will benefit from skilled OT services to improve strength, coordination, fine motor skills, and grip and pinch strength in RUE to increase use of RUE as dominant during functional tasks. Treatment plan: RUE strengthening, grip and pinch strengthening, gross and fine motor coordination tasks, modalities prn    Clinical Decision Making  Limited treatment options, no task modification necessary    OT Home Exercise Plan  5/28: fine motor coordination, red theraputty grip/pinch strengthening    Consulted and Agree with Plan of Care  Patient;Family member/caregiver    Family Member Consulted  wife       Patient will benefit from skilled therapeutic intervention in order to improve the following deficits and impairments:  Decreased activity tolerance, Decreased strength, Decreased range of motion, Decreased coordination, Impaired UE functional use, Increased fascial restrictions  Visit Diagnosis: Hemiplegia and hemiparesis following cerebral infarction affecting right dominant side (HCC)  Other lack of coordination  Other symptoms and signs involving the musculoskeletal system    Problem List Patient Active Problem List   Diagnosis Date Noted  . Cerebral embolism with cerebral infarction 08/01/2017  . CVA (cerebral vascular accident) ([place]) 08/01/2017  . History of CVA with residual deficit   . Coronary artery disease involving native coronary artery of native heart without angina pectoris   . Essential hypertension   . Diabetes mellitus type 2 in obese  ([place])   . Class 1 obesity due to excess calories with serious comorbidity and body mass index (BMI) of 32.0 to 32.9 in adult 09/09/2015  . Sedentary lifestyle 09/09/2015  . Hyperlipidemia 02/24/2015  . Essential hypertension, benign 02/24/2015  . Exertional dyspnea 10/18/2012  . Diastolic heart failure ([place]) 10/01/2011  . Obstructive sleep apnea 09/02/2011  . LV dysfunction 09/02/2011  . ERECTILE DYSFUNCTION, SECONDARY TO MEDICATION 04/14/2010  . Type 2 diabetes mellitus with vascular disease ([place]) 10/02/2009  . ACUT MI SUBENDOCARDIAL INFARCT INIT EPIS CARE 10/02/2009  . CORONARY ATHEROSCLEROSIS NATIVE CORONARY ARTERY 10/02/2009  . CHEST PAIN 10/02/2009   Guadelupe Sabin, OTR/L  [phone] 08/09/2017, 11:29 AM  [facility] [address] [place], [place], [address] Phone: [phone]   Fax:  [phone]  Name: LEORY ALLINSON MRN: [id number] Date of Birth: May 01, 1950

## 2017-08-11 ENCOUNTER — Ambulatory Visit (HOSPITAL_COMMUNITY): Payer: Medicare Other

## 2017-08-11 ENCOUNTER — Ambulatory Visit (HOSPITAL_COMMUNITY): Payer: Medicare Other | Admitting: Occupational Therapy

## 2017-08-11 ENCOUNTER — Encounter (HOSPITAL_COMMUNITY): Payer: Self-pay | Admitting: Occupational Therapy

## 2017-08-11 DIAGNOSIS — R2689 Other abnormalities of gait and mobility: Secondary | ICD-10-CM

## 2017-08-11 DIAGNOSIS — I69351 Hemiplegia and hemiparesis following cerebral infarction affecting right dominant side: Secondary | ICD-10-CM

## 2017-08-11 DIAGNOSIS — R29898 Other symptoms and signs involving the musculoskeletal system: Secondary | ICD-10-CM | POA: Diagnosis not present

## 2017-08-11 DIAGNOSIS — R278 Other lack of coordination: Secondary | ICD-10-CM | POA: Diagnosis not present

## 2017-08-11 NOTE — Therapy (Addendum)
[place], [place], [address] Phone: [phone]   Fax:  [phone]  Occupational Therapy Treatment  Patient Details  Name: Alex Cortez MRN: [id number] Date of Birth: [birth year] Referring Provider: Axel Filler   Encounter Date: 08/11/2017  OT End of Session - 08/11/17 1137    Visit Number  2    Number of Visits  16    Date for OT Re-Evaluation  10/08/17 mini-reassessment 09/10/2017    Authorization Type  1) Medicare A & B; 2) BCBS    Authorization Time Period  BCBS: 75 visits/year; 08/18/17: Discussed with speech and PT, speech will use 3, OT will use 35, and PT will use 35   Authorization - Visit Number  2    Authorization - Number of Visits  35   OT Start Time  1031    OT Stop Time  1113    OT Time Calculation (min)  42 min    Activity Tolerance  Patient tolerated treatment well    Behavior During Therapy  WFL for tasks assessed/performed       Past Medical History:  Diagnosis Date  . CAD (coronary artery disease)    nonobstructive CAD/patent LAD stent site; EF 65%, 08/2011; NSTEMI/DES LAD, 6/20 11  . Chest pain   . Diabetes mellitus    insulin-dependent  . Dyslipidemia   . HTN (hypertension)   . Post MI syndrome ([place]) 08-29-2009  . Stroke [facility]) 10/22/2012   Left inferior pons    Past Surgical History:  Procedure Laterality Date  . CORONARY ANGIOPLASTY WITH STENT PLACEMENT      There were no vitals filed for this visit.  Subjective Assessment - 08/11/17 1030    Subjective   S: The exercises were slow.     Currently in Pain?  No/denies         [facility] OT Assessment - 08/11/17 1030      Assessment   Medical Diagnosis  s/p L CVA      Precautions   Precautions  None               OT Treatments/Exercises (OP) - 08/11/17 1033      Exercises   Exercises  Wrist;Hand;Shoulder;Elbow;Theraputty      Shoulder Exercises: Seated   Protraction  AAROM;10 reps    Horizontal  ABduction  AAROM;10 reps    Horizontal ABduction Limitations  OT providing min tactile cuing to keep trunk still and not turn body    Flexion  AAROM;10 reps      Elbow Exercises   Forearm Supination  AROM;15 reps    Forearm Pronation  AROM;15 reps      Additional Elbow Exercises   Theraputty - Flatten  red      Wrist Exercises   Wrist Extension  AROM;15 reps    Wrist Radial Deviation  AROM;15 reps    Wrist Ulnar Deviation  AROM;15 reps      Additional Wrist Exercises   Sponges  10, 7    Purdue Pegboard  `      Hand Exercises   Other Hand Exercises  Towel crunch, 10X    Other Hand Exercises  Pt placed large pegs into pegboard positioned on vertical board. Working on fine motor coordination and functional reaching. Pt with shoulder fatigue with sustained reach when trying to place pegs. Pt placed on horizontal row and one vertical row, more difficulty with low pegs. OT cuing to sit up straight  and not lean to the left.       Theraputty   Theraputty-flatten Theraputty - Pinch red  red-lateral             OT Education - 08/11/17 1131    Education provided  Yes    Education Details  wrist A/ROM, provided evaluation and reviewed goals    Person(s) Educated  Patient    Methods  Explanation;Demonstration;Handout    Comprehension  Verbalized understanding;Returned demonstration       OT Short Term Goals - 08/11/17 1140      OT SHORT TERM GOAL #1   Title  Pt will be provided with and educated on HEP to promote improved use of RUE as dominant during B/IADLs.     Time  4    Period  Weeks    Status  On-going      OT SHORT TERM GOAL #2   Title  Pt will improve RUE strength to 4/5 to improve ability to perform dressing tasks using RUE as dominant.     Time  4    Period  Weeks    Status  On-going      OT SHORT TERM GOAL #3   Title  Pt will improve right grip strength by 10# and pinch strength by 3# to improve ability to hold eating utensils.     Time  4    Period  Weeks     Status  On-going      OT SHORT TERM GOAL #4   Title  Pt will improve right coordination required for eating tasks by completing 9 hole peg test in 2.5 minutes or less.     Time  4    Period  Weeks    Status  On-going        OT Long Term Goals - 08/11/17 1141      OT LONG TERM GOAL #1   Title  Pt will return to highest level of functioning and independence using RUE as dominant for B/ADL tasks.     Time  8    Period  Weeks    Status  On-going      OT LONG TERM GOAL #2   Title  Pt will improve RUE strength to 4+/5 or greater to improve ability to perform yardwork tasks using RUE as dominant.     Time  8    Period  Weeks    Status  On-going      OT LONG TERM GOAL #3   Title  Pt will improve right grip strength by 18# and pinch strength by 5# to improve ability to hold and carry objects with right hand.     Time  8    Period  Weeks    Status  On-going      OT LONG TERM GOAL #4   Title  Pt will improve right fine motor coordination required for grooming tasks such as shaving by completing 9 hole peg test in under 1 minute.     Time  4    Period  Weeks    Status  On-going            Plan - 08/11/17 1137    Clinical Impression Statement  A: Initiated shoulder, wrist, and grip strengthening this session, as well as functional reaching and fine/gross motor coordination activities. Pt requiring increased time for task completion, min/mod fatigue towards end of session. Pt requiring cuing to control arm on descent versus letting it fall  during functional reaching. Cuing for technique with exercises. Updated HEP and provided evaluation.     Plan  P: Follow up on HEP, continue with fine motor coordination tasks, add weight to wrist exercises if pt able to achieve extension with weight       Patient will benefit from skilled therapeutic intervention in order to improve the following deficits and impairments:  Decreased activity tolerance, Decreased strength, Decreased range of  motion, Decreased coordination, Impaired UE functional use, Increased fascial restrictions  Visit Diagnosis: Hemiplegia and hemiparesis following cerebral infarction affecting right dominant side (HCC)  Other lack of coordination  Other symptoms and signs involving the musculoskeletal system    Problem List Patient Active Problem List   Diagnosis Date Noted  . Cerebral embolism with cerebral infarction 08/01/2017  . CVA (cerebral vascular accident) ([place]) 08/01/2017  . History of CVA with residual deficit   . Coronary artery disease involving native coronary artery of native heart without angina pectoris   . Essential hypertension   . Diabetes mellitus type 2 in obese ([place])   . Class 1 obesity due to excess calories with serious comorbidity and body mass index (BMI) of 32.0 to 32.9 in adult 09/09/2015  . Sedentary lifestyle 09/09/2015  . Hyperlipidemia 02/24/2015  . Essential hypertension, benign 02/24/2015  . Exertional dyspnea 10/18/2012  . Diastolic heart failure ([place]) 10/01/2011  . Obstructive sleep apnea 09/02/2011  . LV dysfunction 09/02/2011  . ERECTILE DYSFUNCTION, SECONDARY TO MEDICATION 04/14/2010  . Type 2 diabetes mellitus with vascular disease ([place]) 10/02/2009  . ACUT MI SUBENDOCARDIAL INFARCT INIT EPIS CARE 10/02/2009  . CORONARY ATHEROSCLEROSIS NATIVE CORONARY ARTERY 10/02/2009  . CHEST PAIN 10/02/2009   Guadelupe Sabin, OTR/L  [phone] 08/11/2017, 11:43 AM  [place] [address] [place], [place], [address] Phone: [phone]   Fax:  [phone]  Name: Alex Cortez MRN: [id number] Date of Birth: 1950-11-21

## 2017-08-11 NOTE — Therapy (Signed)
[place], [place], [address] Phone: [phone]   Fax:  [phone]  Physical Therapy Evaluation  Patient Details  Name: Alex Cortez MRN: [id number] Date of Birth: Oct 05, 1950 Referring Provider: Axel Filler   Encounter Date: 08/11/2017  PT End of Session - 08/11/17 1201    Visit Number  1    Number of Visits  9    Date for PT Re-Evaluation  09/08/17    Authorization Type  Medicare Part A and B, BCBS Federal Emp PPO (PT/OT/SLP = 75 visits per calender year combined), no auth required    Authorization Time Period  08/11/2017 - 09/08/2017    Authorization - Visit Number  3    Authorization - Number of Visits  75 keep count for OT/PT/SLP    PT Start Time  1115    PT Stop Time  1156    PT Time Calculation (min)  41 min    Equipment Utilized During Treatment  Gait belt    Activity Tolerance  Patient tolerated treatment well    Behavior During Therapy  WFL for tasks assessed/performed       Past Medical History:  Diagnosis Date  . CAD (coronary artery disease)    nonobstructive CAD/patent LAD stent site; EF 65%, 08/2011; NSTEMI/DES LAD, 6/20 11  . Chest pain   . Diabetes mellitus    insulin-dependent  . Dyslipidemia   . HTN (hypertension)   . Post MI syndrome ([place]) 08-29-2009  . Stroke [facility]) 10/22/2012   Left inferior pons    Past Surgical History:  Procedure Laterality Date  . CORONARY ANGIOPLASTY WITH STENT PLACEMENT      There were no vitals filed for this visit.   Subjective Assessment - 08/11/17 1219    Subjective  Patient reports he had his stroke on 07/30/17 and went to [facility] on 07/31/17. He was discharged from the hospital on 08/03/17 to his home even though his therapists had recommended he go to IP rehab for more intense therpay. He feels he has continued to improve since discharging from the hospital and is ambulating without an assistive device. He reports teh greatest  difficulty is with using his Rt UE now and that he thinks his balance is a little bit off, hopwever he denies falls. He also can tell his memory and ability to recall words is different from before his stroke. He reports a sedentary lifestyle and poor management of diet to manage BP, cholesterol, and blood sugar levels.    Patient is accompained by:  Family member    Pertinent History  DM, HTN, CAD, multiple strokes (4)    Limitations  Walking;House hold activities;Lifting;Writing    Patient Stated Goals  return to walking more normally again and get back to normal activities like bowling    Currently in Pain?  No/denies         [facility] PT Assessment - 08/11/17 1112      Assessment   Medical Diagnosis  s/p L CVA    Referring Provider  Axel Filler    Onset Date/Surgical Date  07/30/17    Hand Dominance  Right    Next MD Visit  08/31/2017    Prior Therapy  Acute care      Precautions   Precautions  None      Restrictions   Weight Bearing Restrictions  No      Balance Screen   Has the patient fallen in  the past 6 months  No    Has the patient had a decrease in activity level because of a fear of falling?   No    Is the patient reluctant to leave their home because of a fear of falling?   No      Home Film/video editor residence    Living Arrangements  Spouse/significant other    Available Help at Discharge  Family    Type of [place] to enter    Entrance Stairs-Number of Steps  4 13 going upstairs    Entrance Stairs-Rails  Cannot reach both    [place] - single point      Prior Function   Level of Independence  Independent    Vocation  Part time employment as needed, 1-2x/week    Vocation Requirements  delivering cars-driving a lot    Leisure  golf, Haematologist; "bowling",       Cognition   Overall Cognitive Status  Impaired/Different from baseline      Observation/Other  Assessments   Focus on Therapeutic Outcomes (FOTO)   57/100 = 43% limited OT took on 08/09/17      Sensation   Light Touch  Appears Intact      Coordination   Heel Shin Test  equal bil LE      Functional Tests   Functional tests  Single leg stance      Single Leg Stance   Comments  Rt LE = 5 seconds, Lt LE = 3 seconds      ROM / Strength   AROM / PROM / Strength  Strength      Strength   Strength Assessment Site  Hip;Knee;Ankle    Right Hip Flexion  5/5    Right Hip Extension  4/5    Right Hip ABduction  4+/5    Left Hip Flexion  5/5    Left Hip Extension  5/5    Left Hip ABduction  5/5    Right/Left Knee  Right;Left    Right Knee Flexion  4-/5    Right Knee Extension  4+/5    Left Knee Flexion  5/5    Left Knee Extension  5/5    Right Ankle Dorsiflexion  5/5    Left Ankle Dorsiflexion  5/5      Transfers   Five time sit to stand comments   19.2 seconds no UE use      Ambulation/Gait   Ambulation/Gait  Yes    Ambulation/Gait Assistance  7: Independent    Ambulation Distance (Feet)  382 Feet 2MWT    Assistive device  None    Gait Pattern  Decreased arm swing - right;Step-through pattern;Decreased stride length;Decreased hip/knee flexion - right;Decreased hip/knee flexion - left;Poor foot clearance - left;Poor foot clearance - right    Ambulation Surface  Level    Gait velocity  0.94 m/s    Stairs  Yes    Stairs Assistance  6: Modified independent (Device/Increase time)    Stair Management Technique  One rail Right;Alternating pattern;Forwards    Number of Stairs  4    Height of Stairs  6      Standardized Balance Assessment   Standardized Balance Assessment  Timed Up and Go Test;Dynamic Gait Index      Dynamic Gait Index   Level Surface  Normal    Change  in Gait Speed  Mild Impairment    Gait with Horizontal Head Turns  Mild Impairment    Gait with Vertical Head Turns  Mild Impairment    Gait and Pivot Turn  Mild Impairment    Step Over Obstacle  Mild  Impairment    Step Around Obstacles  Mild Impairment    Steps  Mild Impairment    Total Score  17    DGI comment:  17/24 indicates patient is at risk for falls during gait      Timed Up and Go Test   TUG  Normal TUG    Normal TUG (seconds)  14.6    TUG Comments  no AD        Objective measurements completed on examination: See above findings.    [facility] Adult PT Treatment/Exercise - 08/11/17 1112      Exercises   Exercises  Knee/Hip      Knee/Hip Exercises: Supine   Bridges  Both;1 set;15 reps;Limitations    Bridges Limitations  3 second holds       Balance Exercises - 08/11/17 1217      Balance Exercises: Standing   SLS  Eyes open;Upper extremity support 2;Solid surface;3 reps;15 secs 3 reps bil LE        PT Education - 08/11/17 1158    Education provided  Yes    Education Details  Educated on overall findings in evaluation and on initial HEP. Discussed modifieable risk factors for stroke and how to reduce risk of future strokes.    Person(s) Educated  Patient;Spouse    Methods  Explanation;Handout;Verbal cues    Comprehension  Verbalized understanding;Returned demonstration       PT Short Term Goals - 08/11/17 1225      PT SHORT TERM GOAL #1   Title  Patient will be independent with HEP to progress functional LE strength and balance, updated PRN    Time  2    Period  Weeks    Status  New    Target Date  08/25/17        PT Long Term Goals - 08/11/17 1227      PT LONG TERM GOAL #1   Title  Patient will improve MMT for all limited groups to 5/5 to demonstrate improved functional strength and muscle activation.    Time  4    Period  Weeks    Status  New    Target Date  09/08/17      PT LONG TERM GOAL #2   Title  Patient will improve TUG to be = or < 12 secodns to demosntrate increased safety with gait activities and reduced fall risk.    Time  4    Period  Weeks    Status  New      PT LONG TERM GOAL #3   Title  Patient will improve DGI to 21/24 or  greater to demosntrate significant improvement in dynamic/functional balance during gait and indicate reduced fall risk.    Time  4    Period  Weeks    Status  New      PT LONG TERM GOAL #4   Title  Patient will perform 5 time sit to stand in 14 seconds or less to demonstrate significant change in functional strength to improve mobility.    Time  4    Period  Weeks    Status  New      PT LONG TERM GOAL #5   Title  Patient will ambulate at 1.2 m/s during 2 MWT to demonstrate improved gait velocity, increaed community access, increased independenc, and reduced risk of falling.    Time  4    Period  Weeks    Status  New        Plan - 08/11/17 1203    Clinical Impression Statement  Mr. Zubiate presents for initial outpatient physical therapy evaluation following Lt CVA with Rt sided weakness. He is ambulating without and assistive device and reports that he feels like his balance is a little off but overall feels like he is moving around well. Objective testing reveals impaired balance, decreased strength, decreased endurance/activity tolerance, and indicates he is at risk for falling. He has had 3 previous strokes and has not adjusted his lifestyle to address modifiable risk factors to reduce risk of stroke and will benefit from education on modifiable risk factors. He initiated HEP for LE strengthening and balance training today and will benefit from skilled PT interventions to address current impairments and progress towards PLOF.     History and Personal Factors relevant to plan of care:  patient has had 4 strokes    Clinical Presentation  Stable    Clinical Presentation due to:  FOTO, MMT, SLS, DGI, 2MWT, TUG, 5x sit to stand, clinical judgement    Clinical Decision Making  Low    Rehab Potential  Good    PT Frequency  2x / week    PT Duration  4 weeks    PT Treatment/Interventions  ADLs/Self Care Home Management;Electrical Stimulation;Gait training;Stair training;Functional mobility  training;Therapeutic activities;Therapeutic exercise;Balance training;Neuromuscular re-education;Patient/family education;Manual techniques;Passive range of motion    PT Next Visit Plan  Review eval and goals and HEP. Educated on modifiable risk factors for stroke and on "FAST" signs/symptoms, provide pocket card and handout. Initiate balance training and functional LE strengthening.    PT Home Exercise Plan  Eval: bridge, sit to stands, SLS at counter;    Consulted and Agree with Plan of Care  Patient;Family member/caregiver    Family Member Consulted  wife, Adonis Huguenin       Patient will benefit from skilled therapeutic intervention in order to improve the following deficits and impairments:     Visit Diagnosis: Hemiplegia and hemiparesis following cerebral infarction affecting right dominant side ([place])  Other abnormalities of gait and mobility     Problem List Patient Active Problem List   Diagnosis Date Noted  . Cerebral embolism with cerebral infarction 08/01/2017  . CVA (cerebral vascular accident) ([place]) 08/01/2017  . History of CVA with residual deficit   . Coronary artery disease involving native coronary artery of native heart without angina pectoris   . Essential hypertension   . Diabetes mellitus type 2 in obese ([place])   . Class 1 obesity due to excess calories with serious comorbidity and body mass index (BMI) of 32.0 to 32.9 in adult 09/09/2015  . Sedentary lifestyle 09/09/2015  . Hyperlipidemia 02/24/2015  . Essential hypertension, benign 02/24/2015  . Exertional dyspnea 10/18/2012  . Diastolic heart failure ([place]) 10/01/2011  . Obstructive sleep apnea 09/02/2011  . LV dysfunction 09/02/2011  . ERECTILE DYSFUNCTION, SECONDARY TO MEDICATION 04/14/2010  . Type 2 diabetes mellitus with vascular disease ([place]) 10/02/2009  . ACUT MI SUBENDOCARDIAL INFARCT INIT EPIS CARE 10/02/2009  . CORONARY ATHEROSCLEROSIS NATIVE CORONARY ARTERY 10/02/2009  . CHEST PAIN 10/02/2009     Kipp Brood, PT, DPT Physical Therapist with [facility]  08/11/2017 12:31 PM  [place], [place], [address] Phone: [phone]   Fax:  [phone]  Name: SKYLUR FUSTON MRN: [id number] Date of Birth: Mar 01, 1951

## 2017-08-11 NOTE — Patient Instructions (Signed)
AROM Exercises *Complete exercises 10-15 times each, 2-3 times per day*  1) Wrist Flexion  Start with wrist at edge of table, palm facing up. With wrist hanging slightly off table, curl wrist upward, and back down.      2) Wrist Extension  Start with wrist at edge of table, palm facing down. With wrist slightly off the edge of the table, curl wrist up and back down.      3) Radial Deviations  Start with forearm flat against a table, wrist hanging slightly off the edge, and palm facing the wall. Bending at the wrist only, and keeping palm facing the wall, bend wrist so fist is pointing towards the floor, back up to start position, and up towards the ceiling. Return to start.        4) WRIST PRONATION  Turn your forearm towards palm face down.  Keep your elbow bent and by the side of your  Body.      5) WRIST SUPINATION  Turn your forearm towards palm face up.  Keep your elbow bent and by the side of your  Body.           

## 2017-08-11 NOTE — Patient Instructions (Signed)
Supine Bridge REPS: 10-15  SETS: 2-3  HOLD: 3 seconds  WEEKLY: 7x  DAILY: 1x Setup Begin lying on your back with your arms resting at your sides, your legs bent at the knees and your feet flat on the ground. Movement Tighten your abdominals and slowly lift your hips off the floor into a bridge position, keeping your back straight. Tip Make sure to keep your trunk stiff throughout the exercise and your arms flat on the floor. STEP 1 STEP 2 STEP 3 Sit to Stand without Arm Support REPS: 10  SETS: 2-3  WEEKLY: 7x  DAILY: 1x Setup Begin by sitting upright on a chair with your feet slightly wider than shoulder width apart. Movement Reach out with your arms and lean forward at your hips until your bottom starts to lift off the chair. Move your body into a standing upright position, then reverse the order of your movements to return to the starting position. Tip Make sure not to let your knees collapse inward during the exercise. Prepared by Debara Pickett [address] [place], [place] Access your exercises! [email or web address] Your Access Code: [email or web address] Disclaimer: This program provides exercises related to your condition that you can perform at home. As there is a risk of injury with any activity, use caution when performing exercises. If you experience any pain or discomfort, discontinue the exercises and contact your health care provider. Page 1 of 2 08/11/2017 STEP 1 STEP 2 STEP 3 Standing Single Leg Stance with Counter Support REPS: 5-10  SETS: 2-3  HOLD: 10-15 seconds  WEEKLY: 7x  DAILY: 1x Clinician Notes: 5-10 times each leg Setup Begin in a standing upright position with your hands resting on a counter. Movement Lift one foot off the ground and maintain your balance in this position. Tip Make sure to maintain an upright posture and use the counter to help you balance as needed. Prepared by Debara Pickett [address] [place], [place] Access your exercises! [email or web address] Your Access Code: [email or web address] Disclaimer:

## 2017-08-16 ENCOUNTER — Encounter (HOSPITAL_COMMUNITY): Payer: Self-pay | Admitting: Occupational Therapy

## 2017-08-16 ENCOUNTER — Encounter (HOSPITAL_COMMUNITY): Payer: Self-pay

## 2017-08-16 ENCOUNTER — Ambulatory Visit (HOSPITAL_COMMUNITY)
Payer: Medicare Other | Attending: Student in an Organized Health Care Education/Training Program | Admitting: Occupational Therapy

## 2017-08-16 ENCOUNTER — Ambulatory Visit (HOSPITAL_COMMUNITY): Payer: Medicare Other

## 2017-08-16 ENCOUNTER — Other Ambulatory Visit: Payer: Self-pay

## 2017-08-16 ENCOUNTER — Other Ambulatory Visit: Payer: Self-pay | Admitting: "Endocrinology

## 2017-08-16 DIAGNOSIS — R2689 Other abnormalities of gait and mobility: Secondary | ICD-10-CM | POA: Insufficient documentation

## 2017-08-16 DIAGNOSIS — R29898 Other symptoms and signs involving the musculoskeletal system: Secondary | ICD-10-CM

## 2017-08-16 DIAGNOSIS — R41841 Cognitive communication deficit: Secondary | ICD-10-CM | POA: Diagnosis not present

## 2017-08-16 DIAGNOSIS — I69351 Hemiplegia and hemiparesis following cerebral infarction affecting right dominant side: Secondary | ICD-10-CM | POA: Insufficient documentation

## 2017-08-16 DIAGNOSIS — R278 Other lack of coordination: Secondary | ICD-10-CM | POA: Insufficient documentation

## 2017-08-16 MED ORDER — FREESTYLE LIBRE 14 DAY SENSOR MISC: 1.0000 | 5 refills | Status: DC

## 2017-08-16 NOTE — Therapy (Signed)
[place], [place], [address] Phone: [phone]   Fax:  [phone]  Physical Therapy Treatment  Patient Details  Name: Alex Cortez MRN: [id number] Date of Birth: 12/07/1950 Referring Provider: Axel Filler   Encounter Date: 08/16/2017  PT End of Session - 08/16/17 1038    Visit Number  2    Number of Visits  9    Date for PT Re-Evaluation  09/08/17    Authorization Type  Medicare Part A and B, BCBS Federal Emp PPO (PT/OT/SLP = 75 visits per calender year combined), no auth required    Authorization Time Period  08/11/2017 - 09/08/2017    Authorization - Visit Number  5    Authorization - Number of Visits  75 keep count of PT/OT/SLP    PT Start Time  1036    PT Stop Time  1116    PT Time Calculation (min)  40 min    Equipment Utilized During Treatment  Gait belt    Activity Tolerance  Patient tolerated treatment well    Behavior During Therapy  WFL for tasks assessed/performed       Past Medical History:  Diagnosis Date  . CAD (coronary artery disease)    nonobstructive CAD/patent LAD stent site; EF 65%, 08/2011; NSTEMI/DES LAD, 6/20 11  . Chest pain   . Diabetes mellitus    insulin-dependent  . Dyslipidemia   . HTN (hypertension)   . Post MI syndrome ([place]) 08-29-2009  . Stroke [facility]) 10/22/2012   Left inferior pons    Past Surgical History:  Procedure Laterality Date  . CORONARY ANGIOPLASTY WITH STENT PLACEMENT      There were no vitals filed for this visit.  Subjective Assessment - 08/16/17 1037    Subjective  Pt stated he is feeling good today, no reports of pain and has began HEP.      Patient Stated Goals  return to walking more normally again and get back to normal activities like bowling    Currently in Pain?  No/denies                       [facility] Adult PT Treatment/Exercise - 08/16/17 1052      Knee/Hip Exercises: Standing   Heel Raises  15 reps;Limitations     Heel Raises Limitations  Toe raises 15x    Lateral Step Up  Right;15 reps;Hand Hold: 1;Step Height: 6"    Forward Step Up  Right;15 reps;Hand Hold: 1;Step Height: 6"    Functional Squat  15 reps    Rocker Board  2 minutes    SLS  Lt 23", Rt 17" max of 3      Knee/Hip Exercises: Seated   Sit to Sand  10 reps;without UE support eccentric control      Knee/Hip Exercises: Supine   Bridges  2 sets;10 reps    Bridges Limitations  3 second holds          Balance Exercises - 08/16/17 1059      Balance Exercises: Standing   Tandem Stance  Eyes open;Foam/compliant surface;3 reps;30 secs 1st set static; 2nd on foam    SLS  Eyes open;3 reps Lt 23", Rt 17" max of 3    Rockerboard  Anterior/posterior;Lateral;UE support 2 min each    Sidestepping  Theraband;2 reps GTB        PT Education - 08/16/17 1044    Education provided  Yes  Education Details  Reviewed goals, assured compliance with HEP and copy of eval given to pt.  Pt able to recall and demonstrate good mechanics with 2/3 of the HEP exercises.  Educted acronym "FAST" s/s and provided pocket card.    Person(s) Educated  Patient;Spouse    Methods  Explanation;Handout;Verbal cues    Comprehension  Verbalized understanding;Returned demonstration       PT Short Term Goals - 08/11/17 1225      PT SHORT TERM GOAL #1   Title  Patient will be independent with HEP to progress functional LE strength and balance, updated PRN    Time  2    Period  Weeks    Status  New    Target Date  08/25/17        PT Long Term Goals - 08/11/17 1227      PT LONG TERM GOAL #1   Title  Patient will improve MMT for all limited groups to 5/5 to demonstrate improved functional strength and muscle activation.    Time  4    Period  Weeks    Status  New    Target Date  09/08/17      PT LONG TERM GOAL #2   Title  Patient will improve TUG to be = or < 12 secodns to demosntrate increased safety with gait activities and reduced fall risk.    Time   4    Period  Weeks    Status  New      PT LONG TERM GOAL #3   Title  Patient will improve DGI to 21/24 or greater to demosntrate significant improvement in dynamic/functional balance during gait and indicate reduced fall risk.    Time  4    Period  Weeks    Status  New      PT LONG TERM GOAL #4   Title  Patient will perform 5 time sit to stand in 14 seconds or less to demonstrate significant change in functional strength to improve mobility.    Time  4    Period  Weeks    Status  New      PT LONG TERM GOAL #5   Title  Patient will ambulate at 1.2 m/s during 2 MWT to demonstrate improved gait velocity, increaed community access, increased independenc, and reduced risk of falling.    Time  4    Period  Weeks    Status  New            Plan - 08/16/17 1249    Clinical Impression Statement  Reviewed goals, assured compliance with HEP (able to recall 2/3 exercises) and copy of eval given to pt.  Session focus with functional strengthening and balance training.  Pt able to demonstrate appropriate form following initial cueing with min cueing for form.  No reports of pain through session, was limited by fatigue required 2 seated rest breaks through session.      Rehab Potential  Good    PT Frequency  2x / week    PT Duration  4 weeks    PT Treatment/Interventions  ADLs/Self Care Home Management;Electrical Stimulation;Gait training;Stair training;Functional mobility training;Therapeutic activities;Therapeutic exercise;Balance training;Neuromuscular re-education;Patient/family education;Manual techniques;Passive range of motion    PT Next Visit Plan  F/U any questions with "FAST" pocket card.  Progress balance training and functional LE strengthening.    PT Home Exercise Plan  Eval: bridge, sit to stands, SLS at counter;       Patient will benefit  from skilled therapeutic intervention in order to improve the following deficits and impairments:  Abnormal gait, Decreased activity  tolerance, Decreased balance, Decreased mobility, Decreased strength, Improper body mechanics, Obesity, Postural dysfunction, Difficulty walking, Decreased endurance  Visit Diagnosis: Hemiplegia and hemiparesis following cerebral infarction affecting right dominant side (HCC)  Other abnormalities of gait and mobility     Problem List Patient Active Problem List   Diagnosis Date Noted  . Cerebral embolism with cerebral infarction 08/01/2017  . CVA (cerebral vascular accident) ([place]) 08/01/2017  . History of CVA with residual deficit   . Coronary artery disease involving native coronary artery of native heart without angina pectoris   . Essential hypertension   . Diabetes mellitus type 2 in obese ([place])   . Class 1 obesity due to excess calories with serious comorbidity and body mass index (BMI) of 32.0 to 32.9 in adult 09/09/2015  . Sedentary lifestyle 09/09/2015  . Hyperlipidemia 02/24/2015  . Essential hypertension, benign 02/24/2015  . Exertional dyspnea 10/18/2012  . Diastolic heart failure ([place]) 10/01/2011  . Obstructive sleep apnea 09/02/2011  . LV dysfunction 09/02/2011  . ERECTILE DYSFUNCTION, SECONDARY TO MEDICATION 04/14/2010  . Type 2 diabetes mellitus with vascular disease ([place]) 10/02/2009  . ACUT MI SUBENDOCARDIAL INFARCT INIT EPIS CARE 10/02/2009  . CORONARY ATHEROSCLEROSIS NATIVE CORONARY ARTERY 10/02/2009  . CHEST PAIN 10/02/2009   Ihor Austin, [place]; [place]  Aldona Lento 08/16/2017, 12:59 PM  [place] [address] [place], [place], [address] Phone: [phone]   Fax:  [phone]  Name: Alex Cortez MRN: [id number] Date of Birth: 08-Oct-1950

## 2017-08-16 NOTE — Therapy (Addendum)
[place], [place], [address] Phone: [phone]   Fax:  [phone]  Occupational Therapy Treatment  Patient Details  Name: Alex Cortez MRN: [id number] Date of Birth: Jan 04, 1951 Referring Provider: Axel Filler   Encounter Date: 08/16/2017  OT End of Session - 08/16/17 1029    Visit Number  3    Number of Visits  16    Date for OT Re-Evaluation  10/08/17 mini-reassessment 09/10/2017    Authorization Type  1) Medicare A & B; 2) BCBS    Authorization Time Period  BCBS: 75 visits/year; 08/18/17: Discussed with speech and PT, speech will use 3, OT will use 35, and PT will use 35   Authorization - Visit Number  4    Authorization - Number of Visits  35   OT Start Time  [phone]    OT Stop Time  1027    OT Time Calculation (min)  45 min    Activity Tolerance  Patient tolerated treatment well    Behavior During Therapy  [facility] for tasks assessed/performed       Past Medical History:  Diagnosis Date  . CAD (coronary artery disease)    nonobstructive CAD/patent LAD stent site; EF 65%, 08/2011; NSTEMI/DES LAD, 6/20 11  . Chest pain   . Diabetes mellitus    insulin-dependent  . Dyslipidemia   . HTN (hypertension)   . Post MI syndrome ([place]) 08-29-2009  . Stroke [facility]) 10/22/2012   Left inferior pons    Past Surgical History:  Procedure Laterality Date  . CORONARY ANGIOPLASTY WITH STENT PLACEMENT      There were no vitals filed for this visit.  Subjective Assessment - 08/16/17 0938    Subjective   S: Everything is about the same.     Currently in Pain?  No/denies         [facility] OT Assessment - 08/16/17 5366      Assessment   Medical Diagnosis  s/p L CVA      Precautions   Precautions  None               OT Treatments/Exercises (OP) - 08/16/17 0938      Exercises   Exercises  Wrist;Hand;Shoulder;Elbow;Theraputty      Shoulder Exercises: Seated   Protraction  AROM;12 reps    Horizontal ABduction  AROM;12 reps    Flexion  AROM;12 reps      Shoulder Exercises: ROM/Strengthening   UBE (Upper Arm Bike)  Level 1 2' forward 2' reverse Pace: 5.5-6.0      Elbow Exercises   Forearm Supination  AROM;15 reps    Forearm Pronation  AROM;15 reps      Additional Elbow Exercises   Hand Gripper with Large Beads  all beads gripper at 25#    Hand Gripper with Medium Beads  all beads gripper at 29#      Wrist Exercises   Wrist Extension  Strengthening;15 reps    Bar Weights/Barbell (Wrist Extension)  1 lb    Wrist Radial Deviation  Strengthening;15 reps    Bar Weights/Barbell (Radial Deviation)  1 lb    Wrist Ulnar Deviation  Strengthening;15 reps    Bar Weights/Barbell (Ulnar Deviation)  1 lb      Additional Wrist Exercises   Sponges  11, 9      Hand Exercises   Other Hand Exercises  Towel crunch, 10X      Functional Reaching Activities   High  Level  Pt placed and removed 32 clothespins from pinch tree alternating between lateral and 3 point pinch. Activity working on pinch strength, reaching into protraction and flexion with RUE, and gross/fine motor coordination. Pt had increased difficulty with gross motor coordination with higher points on the pinch tree (above head height).              OT Education - 08/16/17 1027    Education provided  Yes    Education Details  Instructed pt to add 1# weight to wrist exercises for strengthening    Person(s) Educated  Patient;Spouse    Methods  Explanation;Demonstration    Comprehension  Verbalized understanding;Returned demonstration       OT Short Term Goals - 08/11/17 1140      OT SHORT TERM GOAL #1   Title  Pt will be provided with and educated on HEP to promote improved use of RUE as dominant during B/IADLs.     Time  4    Period  Weeks    Status  On-going      OT SHORT TERM GOAL #2   Title  Pt will improve RUE strength to 4/5 to improve ability to perform dressing tasks using RUE as dominant.     Time   4    Period  Weeks    Status  On-going      OT SHORT TERM GOAL #3   Title  Pt will improve right grip strength by 10# and pinch strength by 3# to improve ability to hold eating utensils.     Time  4    Period  Weeks    Status  On-going      OT SHORT TERM GOAL #4   Title  Pt will improve right coordination required for eating tasks by completing 9 hole peg test in 2.5 minutes or less.     Time  4    Period  Weeks    Status  On-going        OT Long Term Goals - 08/11/17 1141      OT LONG TERM GOAL #1   Title  Pt will return to highest level of functioning and independence using RUE as dominant for B/ADL tasks.     Time  8    Period  Weeks    Status  On-going      OT LONG TERM GOAL #2   Title  Pt will improve RUE strength to 4+/5 or greater to improve ability to perform yardwork tasks using RUE as dominant.     Time  8    Period  Weeks    Status  On-going      OT LONG TERM GOAL #3   Title  Pt will improve right grip strength by 18# and pinch strength by 5# to improve ability to hold and carry objects with right hand.     Time  8    Period  Weeks    Status  On-going      OT LONG TERM GOAL #4   Title  Pt will improve right fine motor coordination required for grooming tasks such as shaving by completing 9 hole peg test in under 1 minute.     Time  4    Period  Weeks    Status  On-going            Plan - 08/16/17 1030    Clinical Impression Statement  A: Pt reporting HEP is going well, no questions.  Session focusing on RUE functional reaching, gross and fine motor coordination, and grip and pinch strengthening. Pt with great improvements in wrist extension today, able to add weight to all wrist exercises and complete with minimal difficulty. Progress to A/ROM with shoulder exercises, working on ROM and control on descent.  Added gripper activity and functional reaching with pinch tree. Verbal cuing for form and technique during session.     Plan  P: Continue with  shoulder ROM/strengthening adding x to v arms. Add fine motor task with cards-flipping cards/manipulating       Patient will benefit from skilled therapeutic intervention in order to improve the following deficits and impairments:  Decreased activity tolerance, Decreased strength, Decreased range of motion, Decreased coordination, Impaired UE functional use, Increased fascial restrictions  Visit Diagnosis: Other lack of coordination  Other symptoms and signs involving the musculoskeletal system    Problem List Patient Active Problem List   Diagnosis Date Noted  . Cerebral embolism with cerebral infarction 08/01/2017  . CVA (cerebral vascular accident) ([place]) 08/01/2017  . History of CVA with residual deficit   . Coronary artery disease involving native coronary artery of native heart without angina pectoris   . Essential hypertension   . Diabetes mellitus type 2 in obese ([place])   . Class 1 obesity due to excess calories with serious comorbidity and body mass index (BMI) of 32.0 to 32.9 in adult 09/09/2015  . Sedentary lifestyle 09/09/2015  . Hyperlipidemia 02/24/2015  . Essential hypertension, benign 02/24/2015  . Exertional dyspnea 10/18/2012  . Diastolic heart failure ([place]) 10/01/2011  . Obstructive sleep apnea 09/02/2011  . LV dysfunction 09/02/2011  . ERECTILE DYSFUNCTION, SECONDARY TO MEDICATION 04/14/2010  . Type 2 diabetes mellitus with vascular disease ([place]) 10/02/2009  . ACUT MI SUBENDOCARDIAL INFARCT INIT EPIS CARE 10/02/2009  . CORONARY ATHEROSCLEROSIS NATIVE CORONARY ARTERY 10/02/2009  . CHEST PAIN 10/02/2009   Guadelupe Sabin, OTR/L  [phone] 08/16/2017, 10:43 AM  [place], [place], [address] Phone: [phone]   Fax:  [phone]  Name: Alex Cortez MRN: [id number] Date of Birth: 1950-05-19

## 2017-08-18 ENCOUNTER — Other Ambulatory Visit: Payer: Self-pay

## 2017-08-18 ENCOUNTER — Encounter (HOSPITAL_COMMUNITY): Payer: Self-pay

## 2017-08-18 ENCOUNTER — Ambulatory Visit (HOSPITAL_COMMUNITY): Payer: Medicare Other

## 2017-08-18 ENCOUNTER — Ambulatory Visit (HOSPITAL_COMMUNITY): Payer: Medicare Other | Admitting: Speech Pathology

## 2017-08-18 ENCOUNTER — Encounter (HOSPITAL_COMMUNITY): Payer: Self-pay | Admitting: Speech Pathology

## 2017-08-18 DIAGNOSIS — I69351 Hemiplegia and hemiparesis following cerebral infarction affecting right dominant side: Secondary | ICD-10-CM | POA: Diagnosis not present

## 2017-08-18 DIAGNOSIS — R41841 Cognitive communication deficit: Secondary | ICD-10-CM | POA: Diagnosis not present

## 2017-08-18 DIAGNOSIS — R29898 Other symptoms and signs involving the musculoskeletal system: Secondary | ICD-10-CM

## 2017-08-18 DIAGNOSIS — R278 Other lack of coordination: Secondary | ICD-10-CM | POA: Diagnosis not present

## 2017-08-18 DIAGNOSIS — R2689 Other abnormalities of gait and mobility: Secondary | ICD-10-CM | POA: Diagnosis not present

## 2017-08-18 NOTE — Therapy (Signed)
[place], [place], [address] Phone: [phone]   Fax:  [phone]  Occupational Therapy Treatment  Patient Details  Name: Alex Cortez MRN: [id number] Date of Birth: [birth year] Referring Provider: Lalla Brothers, MD   Encounter Date: 08/18/2017  OT End of Session - 08/18/17 1632    Visit Number  4    Number of Visits  16    Date for OT Re-Evaluation  10/08/17 mini-reassessment 09/10/2017    Authorization Type  1) Medicare A & B; 2) BCBS    Authorization Time Period  BCBS: 75 visits/year; 08/18/17: Discussed with speech and PT, speech will use 3, OT will use 35, and PT will use 35    Authorization - Visit Number  4    Authorization - Number of Visits  35    OT Start Time  1353    OT Stop Time  1430    OT Time Calculation (min)  37 min    Activity Tolerance  Patient tolerated treatment well    Behavior During Therapy  WFL for tasks assessed/performed       Past Medical History:  Diagnosis Date  . CAD (coronary artery disease)    nonobstructive CAD/patent LAD stent site; EF 65%, 08/2011; NSTEMI/DES LAD, 6/20 11  . Chest pain   . Diabetes mellitus    insulin-dependent  . Dyslipidemia   . HTN (hypertension)   . Post MI syndrome ([place]) 08-29-2009  . Stroke [facility]) 10/22/2012   Left inferior pons    Past Surgical History:  Procedure Laterality Date  . CORONARY ANGIOPLASTY WITH STENT PLACEMENT      There were no vitals filed for this visit.  Subjective Assessment - 08/18/17 1629    Subjective   S: It is feeling good. Still about the same.    Patient is accompained by:  Family member    Currently in Pain?  No/denies         [facility] OT Assessment - 08/18/17 1631      Assessment   Medical Diagnosis  s/p L CVA      Precautions   Precautions  None               OT Treatments/Exercises (OP) - 08/18/17 1353      Exercises   Exercises  Wrist;Hand;Shoulder;Elbow;Theraputty      Shoulder  Exercises: Seated   Protraction  Strengthening;12 reps    Protraction Weight (lbs)  1    Horizontal ABduction  Strengthening;12 reps    Horizontal ABduction Weight (lbs)  1    External Rotation  Strengthening;12 reps    External Rotation Weight (lbs)  1    Internal Rotation  Strengthening;12 reps    Internal Rotation Weight (lbs)  1    Flexion  Strengthening;12 reps    Flexion Weight (lbs)  1    Abduction  Strengthening;12 reps    ABduction Weight (lbs)  1      Shoulder Exercises: ROM/Strengthening   X to V Arms  10X      Elbow Exercises   Forearm Supination  Strengthening;15 reps    Bar Weights/Barbell (Forearm Supination)  1 lb    Forearm Pronation  Strengthening;15 reps    Bar Weights/Barbell (Forearm Pronation)  1 lb      Additional Elbow Exercises   Hand Gripper with Large Beads  all beads gripper at 35#    Hand Gripper with Medium Beads  all beads gripper at 35#  Hand Gripper with Small Beads  5 beads at 35# with gripper held horizontally      Wrist Exercises   Wrist Extension  Strengthening;15 reps    Bar Weights/Barbell (Wrist Extension)  1 lb    Wrist Radial Deviation  Strengthening;15 reps    Bar Weights/Barbell (Radial Deviation)  1 lb    Wrist Ulnar Deviation  Strengthening;15 reps    Bar Weights/Barbell (Ulnar Deviation)  1 lb      Fine Motor Coordination (Hand/Wrist)   Fine Motor Coordination  Flipping cards    Flipping cards  Flipped 40 cards to work on supination and pronation arm resting on table             OT Education - 08/18/17 1631    Education provided  No       OT Short Term Goals - 08/11/17 1140      OT SHORT TERM GOAL #1   Title  Pt will be provided with and educated on HEP to promote improved use of RUE as dominant during B/IADLs.     Time  4    Period  Weeks    Status  On-going      OT SHORT TERM GOAL #2   Title  Pt will improve RUE strength to 4/5 to improve ability to perform dressing tasks using RUE as dominant.     Time   4    Period  Weeks    Status  On-going      OT SHORT TERM GOAL #3   Title  Pt will improve right grip strength by 10# and pinch strength by 3# to improve ability to hold eating utensils.     Time  4    Period  Weeks    Status  On-going      OT SHORT TERM GOAL #4   Title  Pt will improve right coordination required for eating tasks by completing 9 hole peg test in 2.5 minutes or less.     Time  4    Period  Weeks    Status  On-going        OT Long Term Goals - 08/11/17 1141      OT LONG TERM GOAL #1   Title  Pt will return to highest level of functioning and independence using RUE as dominant for B/ADL tasks.     Time  8    Period  Weeks    Status  On-going      OT LONG TERM GOAL #2   Title  Pt will improve RUE strength to 4+/5 or greater to improve ability to perform yardwork tasks using RUE as dominant.     Time  8    Period  Weeks    Status  On-going      OT LONG TERM GOAL #3   Title  Pt will improve right grip strength by 18# and pinch strength by 5# to improve ability to hold and carry objects with right hand.     Time  8    Period  Weeks    Status  On-going      OT LONG TERM GOAL #4   Title  Pt will improve right fine motor coordination required for grooming tasks such as shaving by completing 9 hole peg test in under 1 minute.     Time  4    Period  Weeks    Status  On-going  Plan - 08/18/17 1636    Clinical Impression Statement  A: Pt progressed to strengthening this session and was able to increase grip resisitance during gripper exercises. X to V arms and fine motor card manipulation task added. Minimal VC required for peoper form and technique.    Plan  P: Continue with strengthening increasing reps as tolerated. Add coin manipulation activity.    Consulted and Agree with Plan of Care  Patient;Family member/caregiver    Family Member Consulted  wife       Patient will benefit from skilled therapeutic intervention in order to improve the  following deficits and impairments:  Decreased activity tolerance, Decreased strength, Decreased range of motion, Decreased coordination, Impaired UE functional use, Increased fascial restrictions  Visit Diagnosis: Hemiplegia and hemiparesis following cerebral infarction affecting right dominant side (HCC)  Other lack of coordination  Other symptoms and signs involving the musculoskeletal system    Problem List Patient Active Problem List   Diagnosis Date Noted  . Cerebral embolism with cerebral infarction 08/01/2017  . CVA (cerebral vascular accident) ([place]) 08/01/2017  . History of CVA with residual deficit   . Coronary artery disease involving native coronary artery of native heart without angina pectoris   . Essential hypertension   . Diabetes mellitus type 2 in obese ([place])   . Class 1 obesity due to excess calories with serious comorbidity and body mass index (BMI) of 32.0 to 32.9 in adult 09/09/2015  . Sedentary lifestyle 09/09/2015  . Hyperlipidemia 02/24/2015  . Essential hypertension, benign 02/24/2015  . Exertional dyspnea 10/18/2012  . Diastolic heart failure ([place]) 10/01/2011  . Obstructive sleep apnea 09/02/2011  . LV dysfunction 09/02/2011  . ERECTILE DYSFUNCTION, SECONDARY TO MEDICATION 04/14/2010  . Type 2 diabetes mellitus with vascular disease ([place]) 10/02/2009  . ACUT MI SUBENDOCARDIAL INFARCT INIT EPIS CARE 10/02/2009  . CORONARY ATHEROSCLEROSIS NATIVE CORONARY ARTERY 10/02/2009  . CHEST PAIN 10/02/2009    Roderic Palau, OT student 08/18/2017, 4:43 PM  [place] [address] [place], [place], [address] Phone: [phone]   Fax:  [phone]  Name: Alex Cortez MRN: [id number] Date of Birth: Jan 31, 1951

## 2017-08-18 NOTE — Patient Outreach (Signed)
[facility]) Care Management  08/18/2017  Alex Cortez 1950/12/25 [id number]  EMMI: stroke red alert Referral date: 08/18/17 Referral reason: new worsening pain/ fever/ shortness of breath; YES,   Able to eat / drink:  No Insurance: Medicare Day #m 13  Telephone call to patient regarding EMMI stroke red alert. HIPAA verified with patient. Explained reason for call.  Patient states he is not having any new symptoms or  Difficulty with eating or drinking.   Patient states he has seen his primary MD since discharge from the hospital and is scheduled to see the neurologist on 08/31/17.   Patient reports he has his medications and takes them as prescribed. Patient states he is taking plavix and aspirin. Patient understands signs/ of bleeding.  Patient states he started his outpatient therapy approximately 2 weeks ago. RNCM inquired if patient was wearing a 30 day event monitor. Patient states he was while in the hospital that he was to wear a monitor after being discharged. Patient states he has not heard anything regarding the monitor.  Patient denies having a follow up appointment scheduled with his cardiologist, Dr Bronson Ing.   RNCM called patients primary MD office and spoke with Dewanna.  Left message for Dr. Lizbeth Bark nurse that patients discharge instructions recommended he have a 30 day event monitor.    PLAN: RNCM will follow up with patient and/ or primary MD office within 4 business days.   Quinn Plowman RN,BSN,CCM [facility] Telephonic  [phone]

## 2017-08-18 NOTE — Therapy (Addendum)
[place], [place], [address] Phone: [phone]   Fax:  [phone]  Physical Therapy Treatment  Patient Details  Name: Alex Cortez MRN: [id number] Date of Birth: 03-02-1951 Referring Provider: Lalla Brothers, MD   Encounter Date: 08/18/2017  PT End of Session - 08/18/17 1434    Visit Number  3    Number of Visits  9    Date for PT Re-Evaluation  09/08/17    Authorization Type  Medicare Part A and B, BCBS Federal Emp PPO (PT/OT/SLP = 75 visits per calender year combined), no auth required    Authorization Time Period  08/11/2017 - 09/08/2017    Authorization - Visit Number  3    Authorization - Number of Visits  35 75 total visits allowed.  2 sessions for SLP; divided PT/OT    PT Start Time  1432    PT Stop Time  1515    PT Time Calculation (min)  43 min    Equipment Utilized During Treatment  Gait belt    Activity Tolerance  Patient tolerated treatment well    Behavior During Therapy  WFL for tasks assessed/performed       Past Medical History:  Diagnosis Date  . CAD (coronary artery disease)    nonobstructive CAD/patent LAD stent site; EF 65%, 08/2011; NSTEMI/DES LAD, 6/20 11  . Chest pain   . Diabetes mellitus    insulin-dependent  . Dyslipidemia   . HTN (hypertension)   . Post MI syndrome ([place]) 08-29-2009  . Stroke [facility]) 10/22/2012   Left inferior pons    Past Surgical History:  Procedure Laterality Date  . CORONARY ANGIOPLASTY WITH STENT PLACEMENT      There were no vitals filed for this visit.  Subjective Assessment - 08/18/17 1434    Subjective  Pt stated he is feeling good today, no reports of pain today.      Patient Stated Goals  return to walking more normally again and get back to normal activities like bowling    Currently in Pain?  No/denies          [facility] PT Assessment - 08/18/17 0001      Assessment   Medical Diagnosis  s/p L CVA    Referring Provider  Lalla Brothers, MD     Onset Date/Surgical Date  07/30/17    Hand Dominance  Right    Next MD Visit  08/31/2017    Prior Therapy  Acute care      Precautions   Precautions  None                   [facility] Adult PT Treatment/Exercise - 08/18/17 1436        Knee/Hip Exercises: Standing   Heel Raises  15 reps;Limitations    Heel Raises Limitations  Toe raises 15x incline slope    Forward Lunges  15 reps;Limitations    Forward Lunges Limitations  6in step, no HHA last 5 reps    Hip Abduction  15 reps;Both;Knee straight;Limitations    Abduction Limitations  GTB, cueing to reduce ER with movement    Lateral Step Up  Right;15 reps;Hand Hold: 1;Step Height: 6"    Forward Step Up  Right;15 reps;Hand Hold: 1;Step Height: 6"    Functional Squat  15 reps front of chair, cueing for mechanics    Rocker Board  2 minutes lateral and DF/PF    SLS  Lt 9", 29", 8"; Rt 10",  23",  27" max of 3      Knee/Hip Exercises: Supine   Bridges  15 reps    Single Leg Bridge  Both;10 reps 10 each            Balance Exercises - 08/18/17 1502      Balance Exercises: Standing   Tandem Stance  Eyes open;Foam/compliant surface;3 reps;30 secs last set with palvo RTB    SLS  Eyes open;3 reps Lt 9, 29,     Rockerboard  Anterior/posterior;Lateral;UE support minimal pressure Bil HHA    Sidestepping  Theraband;2 reps GTB          PT Short Term Goals - 08/11/17 1225      PT SHORT TERM GOAL #1   Title  Patient will be independent with HEP to progress functional LE strength and balance, updated PRN    Time  2    Period  Weeks    Status  New    Target Date  08/25/17        PT Long Term Goals - 08/11/17 1227      PT LONG TERM GOAL #1   Title  Patient will improve MMT for all limited groups to 5/5 to demonstrate improved functional strength and muscle activation.    Time  4    Period  Weeks    Status  New    Target Date  09/08/17      PT LONG TERM GOAL #2   Title  Patient will improve TUG to be = or < 12  secodns to demosntrate increased safety with gait activities and reduced fall risk.    Time  4    Period  Weeks    Status  New      PT LONG TERM GOAL #3   Title  Patient will improve DGI to 21/24 or greater to demosntrate significant improvement in dynamic/functional balance during gait and indicate reduced fall risk.    Time  4    Period  Weeks    Status  New      PT LONG TERM GOAL #4   Title  Patient will perform 5 time sit to stand in 14 seconds or less to demonstrate significant change in functional strength to improve mobility.    Time  4    Period  Weeks    Status  New      PT LONG TERM GOAL #5   Title  Patient will ambulate at 1.2 m/s during 2 MWT to demonstrate improved gait velocity, increaed community access, increased independenc, and reduced risk of falling.    Time  4    Period  Weeks    Status  New            Plan - 08/18/17 1522    Clinical Impression Statement  Continued session focus with functional strengthening and balance training.  Progressed to SLS bridges, lunges and palvo with tandem stance for proximal strengthening and core strengtheing with balance activities.  Pt able to complete all with minimal cueing for form/technique.  Pt easily fatigued with 2 seated rest breaks through session and noted increased hyperhidrosis, wash clothe given to remove excess during session.      Rehab Potential  Good    PT Frequency  2x / week    PT Duration  4 weeks    PT Treatment/Interventions  ADLs/Self Care Home Management;Electrical Stimulation;Gait training;Stair training;Functional mobility training;Therapeutic activities;Therapeutic exercise;Balance training;Neuromuscular re-education;Patient/family education;Manual techniques;Passive range of motion  PT Next Visit Plan  Begin tandem and reto gait next session.  Progress balance training and functional strengthening as able.      PT Home Exercise Plan  Eval: bridge, sit to stands, SLS at counter;    Consulted and  Agree with Plan of Care  Patient;Family member/caregiver    Family Member Consulted  wife, Adonis Huguenin       Patient will benefit from skilled therapeutic intervention in order to improve the following deficits and impairments:  Abnormal gait, Decreased activity tolerance, Decreased balance, Decreased mobility, Decreased strength, Improper body mechanics, Obesity, Postural dysfunction, Difficulty walking, Decreased endurance  Visit Diagnosis: Hemiplegia and hemiparesis following cerebral infarction affecting right dominant side (HCC)  Other abnormalities of gait and mobility     Problem List Patient Active Problem List   Diagnosis Date Noted  . Cerebral embolism with cerebral infarction 08/01/2017  . CVA (cerebral vascular accident) ([place]) 08/01/2017  . History of CVA with residual deficit   . Coronary artery disease involving native coronary artery of native heart without angina pectoris   . Essential hypertension   . Diabetes mellitus type 2 in obese ([place])   . Class 1 obesity due to excess calories with serious comorbidity and body mass index (BMI) of 32.0 to 32.9 in adult 09/09/2015  . Sedentary lifestyle 09/09/2015  . Hyperlipidemia 02/24/2015  . Essential hypertension, benign 02/24/2015  . Exertional dyspnea 10/18/2012  . Diastolic heart failure ([place]) 10/01/2011  . Obstructive sleep apnea 09/02/2011  . LV dysfunction 09/02/2011  . ERECTILE DYSFUNCTION, SECONDARY TO MEDICATION 04/14/2010  . Type 2 diabetes mellitus with vascular disease ([place]) 10/02/2009  . ACUT MI SUBENDOCARDIAL INFARCT INIT EPIS CARE 10/02/2009  . CORONARY ATHEROSCLEROSIS NATIVE CORONARY ARTERY 10/02/2009  . CHEST PAIN 10/02/2009   Ihor Austin, [place]; [place]  Aldona Lento 08/18/2017, 3:28 PM  [place] [address] [place], [place], [address] Phone: [phone]   Fax:  [phone]  Name: VENUS RUHE MRN: [id number] Date of Birth:  September 14, 1950

## 2017-08-18 NOTE — Therapy (Signed)
[place], [place], [address] Phone: [phone]   Fax:  [phone]  Speech Language Pathology Evaluation  Patient Details  Name: Alex Cortez MRN: [id number] Date of Birth: [birth year] Referring Provider: Lalla Brothers, MD   Encounter Date: 08/18/2017  End of Session - 08/18/17 1642    Visit Number  1    Number of Visits  3    Authorization Type  Medicare; [place]  Medicare Part A and B, BCBS Federal Emp PPO (PT/OT/SLP = 75 visits per calender year combined), no auth required     Authorization - Visit Number  8    Authorization - Number of Visits  75 across all disciplines    SLP Start Time  0737    SLP Stop Time   1600    SLP Time Calculation (min)  45 min    Activity Tolerance  Patient tolerated treatment well       Past Medical History:  Diagnosis Date  . CAD (coronary artery disease)    nonobstructive CAD/patent LAD stent site; EF 65%, 08/2011; NSTEMI/DES LAD, 6/20 11  . Chest pain   . Diabetes mellitus    insulin-dependent  . Dyslipidemia   . HTN (hypertension)   . Post MI syndrome ([place]) 08-29-2009  . Stroke [facility]) 10/22/2012   Left inferior pons    Past Surgical History:  Procedure Laterality Date  . CORONARY ANGIOPLASTY WITH STENT PLACEMENT      There were no vitals filed for this visit.  Subjective Assessment - 08/18/17 1637    Subjective  "I just can't think."    Patient is accompained by:  Family member    Special Tests  [facility]    Currently in Pain?  No/denies         SLP Evaluation [facility] - 08/18/17 1637      SLP Visit Information   SLP Received On  08/18/17    Referring Provider  Lalla Brothers, MD    Onset Date  07/30/2017    Medical Diagnosis  s/p CVA      Subjective   Subjective  "I just can't think."    Patient/Family Stated Goal  None stated      General Information   HPI  Pt is a 67 y/o male s/p left CVA on 07/30/17 presenting with right hemiparesis. Pt received  SLP services while in acute care at [facility], discharged home with wife assisting with ADLs. Pt was referred to speech pathology by Dr. Lalla Brothers.    Behavioral/Cognition  Alert and cooperative    Mobility Status  ambulatory      Balance Screen   Has the patient fallen in the past 6 months  No    Has the patient had a decrease in activity level because of a fear of falling?   No    Is the patient reluctant to leave their home because of a fear of falling?   No      Prior Functional Status   Cognitive/Linguistic Baseline  Within functional limits    Type of Home  House     Lives With  Spouse    Available Support  Family    Education  some college- [employer]    Vocation  Retired      Pain Assessment   Pain Assessment  No/denies pain      Cognition   Overall Cognitive Status  Impaired/Different from baseline    Area of Impairment  Attention;Memory    Current Attention Level  Sustained    Memory  Decreased short-term memory    Attention  Sustained    Sustained Attention  Impaired    Sustained Attention Impairment  Functional complex    Memory  Impaired 3/5 word recall after 5 minutes    Memory Impairment  Retrieval deficit    Awareness  Appears intact    Problem Solving  Appears intact      Auditory Comprehension   Overall Auditory Comprehension  Appears within functional limits for tasks assessed    Yes/No Questions  Within Functional Limits    Commands  Within Functional Limits    Conversation  Complex    Interfering Components  Attention      Visual Recognition/Discrimination   Discrimination  Within Function Limits      Reading Comprehension   Reading Status  Not tested      Expression   Primary Mode of Expression  Verbal      Verbal Expression   Overall Verbal Expression  Impaired    Initiation  No impairment    Automatic Speech  Name;Social Response;Month of year    Level of Generative/Spontaneous Verbalization  Conversation    Repetition  No  impairment    Naming  Impairment    Responsive  76-100% accurate    Confrontation  75-100% accurate    Convergent  75-100% accurate    Divergent  25-49% accurate    Pragmatics  No impairment    Interfering Components  Attention;Speech intelligibility    Non-Verbal Means of Communication  Not applicable      Written Expression   Dominant Hand  Right    Written Expression  Exceptions to [facility]    Overall Writen Expression  RUE weakness s/p CVA, but able to write short selections      Oral Motor/Sensory Function   Overall Oral Motor/Sensory Function  Appears within functional limits for tasks assessed      Motor Speech   Overall Motor Speech  Impaired    Respiration  Within functional limits    Phonation  Normal    Resonance  Within functional limits    Articulation  Impaired    Level of Impairment  Conversation    Intelligibility  Intelligibility reduced    Word  75-100% accurate    Phrase  75-100% accurate    Sentence  75-100% accurate    Conversation  75-100% accurate    Motor Planning  Witnin functional limits    Motor Speech Errors  Not applicable    Effective Techniques  Slow rate    Phonation  WFL      Standardized Assessments   Standardized Assessments   Montreal Cognitive Assessment (MOCA)    Montreal Cognitive Assessment (MOCA)   24/30        SLP Short Term Goals - 08/18/17 1706      SLP SHORT TERM GOAL #1   Title  Pt will identify 3 compensatory memory strategies which he feels will be effective to use during functional memory activities after review with SLP.     Baseline  not using    Time  1    Period  Weeks    Status  New    Target Date  08/25/17      SLP SHORT TERM GOAL #2   Title  Pt will complete functional memory exercises with 90% acc with use of compensatory strategies as needed.     Baseline  66%  Time  1    Period  Weeks    Status  New    Target Date  08/25/17      SLP SHORT TERM GOAL #3   Title  Pt will increase divergent naming for  concrete categories to 7+ items with min assist for use of strategies.     Baseline  3    Time  1    Period  Weeks    Status  New    Target Date  08/25/17       SLP Long Term Goals - 08/18/17 1709      SLP LONG TERM GOAL #1   Title  Same as short       Plan - 08/18/17 1703    Clinical Impression Statement  Pt presents with mild cognitive linguistic deficits characterized by impairments in attention/concentration, working memory, divergent naming, and mild ataxic dysarthria, however reportedly much improved from acute care SLP evaluation. Pt and wife report that Pt is doing quite well at home and even question whether he needs therapy. Recommend short term therapy to address above deficits and increase independence with cognitive linguistic skills.     Speech Therapy Frequency  2x / week    Duration  1 week    Treatment/Interventions  SLP instruction and feedback;Compensatory strategies;Compensatory techniques;Patient/family education;Cueing hierarchy;Multimodal communcation approach    Potential to Achieve Goals  Good    SLP Home Exercise Plan  Pt will completed HEP as assigned to facilitate carryover of treatment strategies and techniques in home environment.    Consulted and Agree with Plan of Care  Patient;Family member/caregiver    Family Member Consulted  Spouse, Mahmood Boehringer       Patient will benefit from skilled therapeutic intervention in order to improve the following deficits and impairments:   Cognitive communication deficit    Problem List Patient Active Problem List   Diagnosis Date Noted  . Cerebral embolism with cerebral infarction 08/01/2017  . CVA (cerebral vascular accident) ([place]) 08/01/2017  . History of CVA with residual deficit   . Coronary artery disease involving native coronary artery of native heart without angina pectoris   . Essential hypertension   . Diabetes mellitus type 2 in obese ([place])   . Class 1 obesity due to excess calories with serious  comorbidity and body mass index (BMI) of 32.0 to 32.9 in adult 09/09/2015  . Sedentary lifestyle 09/09/2015  . Hyperlipidemia 02/24/2015  . Essential hypertension, benign 02/24/2015  . Exertional dyspnea 10/18/2012  . Diastolic heart failure ([place]) 10/01/2011  . Obstructive sleep apnea 09/02/2011  . LV dysfunction 09/02/2011  . ERECTILE DYSFUNCTION, SECONDARY TO MEDICATION 04/14/2010  . Type 2 diabetes mellitus with vascular disease ([place]) 10/02/2009  . ACUT MI SUBENDOCARDIAL INFARCT INIT EPIS CARE 10/02/2009  . CORONARY ATHEROSCLEROSIS NATIVE CORONARY ARTERY 10/02/2009  . CHEST PAIN 10/02/2009   Thank you,  Genene Churn, [place]  [place] 08/18/2017, 5:10 PM  [place], [place], [address] Phone: [phone]   Fax:  [phone]  Name: Alex Cortez MRN: [id number] Date of Birth: 09-11-1950

## 2017-08-23 ENCOUNTER — Other Ambulatory Visit: Payer: Self-pay

## 2017-08-23 ENCOUNTER — Ambulatory Visit (HOSPITAL_COMMUNITY): Payer: Medicare Other | Admitting: Speech Pathology

## 2017-08-23 ENCOUNTER — Encounter (HOSPITAL_COMMUNITY): Payer: Self-pay | Admitting: Speech Pathology

## 2017-08-23 ENCOUNTER — Encounter (HOSPITAL_COMMUNITY): Payer: Self-pay | Admitting: Occupational Therapy

## 2017-08-23 ENCOUNTER — Ambulatory Visit (HOSPITAL_COMMUNITY): Payer: Medicare Other

## 2017-08-23 ENCOUNTER — Ambulatory Visit (HOSPITAL_COMMUNITY): Payer: Medicare Other | Admitting: Occupational Therapy

## 2017-08-23 ENCOUNTER — Encounter (HOSPITAL_COMMUNITY): Payer: Self-pay

## 2017-08-23 DIAGNOSIS — R2689 Other abnormalities of gait and mobility: Secondary | ICD-10-CM | POA: Diagnosis not present

## 2017-08-23 DIAGNOSIS — R29898 Other symptoms and signs involving the musculoskeletal system: Secondary | ICD-10-CM | POA: Diagnosis not present

## 2017-08-23 DIAGNOSIS — R41841 Cognitive communication deficit: Secondary | ICD-10-CM

## 2017-08-23 DIAGNOSIS — R278 Other lack of coordination: Secondary | ICD-10-CM | POA: Diagnosis not present

## 2017-08-23 DIAGNOSIS — I69351 Hemiplegia and hemiparesis following cerebral infarction affecting right dominant side: Secondary | ICD-10-CM | POA: Diagnosis not present

## 2017-08-23 NOTE — Therapy (Signed)
[place], [place], [address] Phone: [phone]   Fax:  [phone]  Physical Therapy Treatment  Patient Details  Name: Alex Cortez MRN: [id number] Date of Birth: [birth year] Referring Provider: Lalla Brothers, MD   Encounter Date: 08/23/2017  PT End of Session - 08/23/17 1522    Visit Number  4    Number of Visits  9    Date for PT Re-Evaluation  09/08/17    Authorization Type  Medicare Part A and B, BCBS Federal Emp PPO (PT/OT/SLP = 75 visits per calender year combined), no auth required    Authorization Time Period  08/11/2017 - 09/08/2017    Authorization - Visit Number  4    Authorization - Number of Visits  35 75 total visits allowed.  2 sessions for SLP; divided PT/OT    PT Start Time  1435    PT Stop Time  1518    PT Time Calculation (min)  43 min    Activity Tolerance  Patient tolerated treatment well    Behavior During Therapy  WFL for tasks assessed/performed       Past Medical History:  Diagnosis Date  . CAD (coronary artery disease)    nonobstructive CAD/patent LAD stent site; EF 65%, 08/2011; NSTEMI/DES LAD, 6/20 11  . Chest pain   . Diabetes mellitus    insulin-dependent  . Dyslipidemia   . HTN (hypertension)   . Post MI syndrome ([place]) 08-29-2009  . Stroke [name] 10/22/2012   Left inferior pons    Past Surgical History:  Procedure Laterality Date  . CORONARY ANGIOPLASTY WITH STENT PLACEMENT      There were no vitals filed for this visit.  Subjective Assessment - 08/23/17 1438    Subjective  Doing well. Feels like right shoulder and leg are getting better over time. Working on [employer].     Patient is accompained by:  Family member                       [facility] [facility] Adult PT Treatment/Exercise - 08/23/17 1448      Knee/Hip Exercises: Standing   Heel Raises  15 reps;Limitations    Heel Raises Limitations  Toe raises 15x incline slope    Forward Lunges  15  reps;Limitations    Forward Lunges Limitations  6in step, no HHA last 5 reps    Hip Abduction  15 reps;Both;Knee straight;Limitations    Abduction Limitations  GTB, cueing to reduce ER with movement    Lateral Step Up  Right;15 reps;Hand Hold: 1;Step Height: 6"    Forward Step Up  Right;15 reps;Hand Hold: 1;Step Height: 6"    Functional Squat  15 reps front of chair, cueing for mechanics    Rocker Board  2 minutes lateral and DF/PF    SLS  30 sec x 3 bilateral      Knee/Hip Exercises: Supine   Bridges  15 reps    Single Leg Bridge  Both;10 reps 10 each          Balance Exercises - 08/23/17 1509      Balance Exercises: Standing   Tandem Stance  Eyes open;Foam/compliant surface;3 reps;30 secs    Sidestepping  Theraband;2 reps GTB        PT Education - 08/23/17 1521    Education provided  Yes    Education Details  Clinic exercises for strength and balance and continual challenge to neural system  to gain coordination and fluid balance and movements.    Person(s) Educated  Patient    Methods  Explanation    Comprehension  Verbalized understanding       PT Short Term Goals - 08/23/17 1526      PT SHORT TERM GOAL #1   Title  Patient will be independent with HEP to progress functional LE strength and balance, updated PRN    Time  2    Period  Weeks    Status  On-going        PT Long Term Goals - 08/23/17 1525      PT LONG TERM GOAL #1   Title  Patient will improve MMT for all limited groups to 5/5 to demonstrate improved functional strength and muscle activation.    Time  4    Period  Weeks    Status  On-going      PT LONG TERM GOAL #2   Title  Patient will improve TUG to be = or < 12 seconds to demonstrate increased safety with gait activities and reduced fall risk.    Time  4    Period  Weeks    Status  On-going      PT LONG TERM GOAL #3   Title  Patient will improve DGI to 21/24 or greater to demosntrate significant improvement in dynamic/functional balance  during gait and indicate reduced fall risk.    Time  4    Period  Weeks    Status  On-going      PT LONG TERM GOAL #4   Title  Patient will perform 5 time sit to stand in 14 seconds or less to demonstrate significant change in functional strength to improve mobility.    Time  4    Period  Weeks    Status  On-going      PT LONG TERM GOAL #5   Title  Patient will ambulate at 1.2 m/s during 2 MWT to demonstrate improved gait velocity, increaed community access, increased independenc, and reduced risk of falling.    Time  4    Period  Weeks    Status  On-going            Plan - 08/23/17 1458    Clinical Impression Statement  continued focus with functional strengthening and balance trainining. Continued lunges and tandem stance for proximal and core strengtheing with balance activities.  Pt able to complete all with minimal cueing for form/technique.  Pt didn't appear to be as fatigued today but did note hyperhidrosis.    Rehab Potential  Good    PT Frequency  2x / week    PT Duration  4 weeks    PT Treatment/Interventions  ADLs/Self Care Home Management;Electrical Stimulation;Gait training;Stair training;Functional mobility training;Therapeutic activities;Therapeutic exercise;Balance training;Neuromuscular re-education;Patient/family education;Manual techniques;Passive range of motion    PT Next Visit Plan  Begin retro gait next session.  Progress balance training and functional strengthening as able.      PT Home Exercise Plan  Eval: bridge, sit to stands, SLS at counter;    Consulted and Agree with Plan of Care  Patient;Family member/caregiver    Family Member Consulted  wife, Adonis Huguenin       Patient will benefit from skilled therapeutic intervention in order to improve the following deficits and impairments:  Abnormal gait, Decreased activity tolerance, Decreased balance, Decreased mobility, Decreased strength, Improper body mechanics, Obesity, Postural dysfunction, Difficulty  walking, Decreased endurance  Visit Diagnosis: Other abnormalities of  gait and mobility     Problem List Patient Active Problem List   Diagnosis Date Noted  . Cerebral embolism with cerebral infarction 08/01/2017  . CVA (cerebral vascular accident) ([place]) 08/01/2017  . History of CVA with residual deficit   . Coronary artery disease involving native coronary artery of native heart without angina pectoris   . Essential hypertension   . Diabetes mellitus type 2 in obese ([place])   . Class 1 obesity due to excess calories with serious comorbidity and body mass index (BMI) of 32.0 to 32.9 in adult 09/09/2015  . Sedentary lifestyle 09/09/2015  . Hyperlipidemia 02/24/2015  . Essential hypertension, benign 02/24/2015  . Exertional dyspnea 10/18/2012  . Diastolic heart failure ([place]) 10/01/2011  . Obstructive sleep apnea 09/02/2011  . LV dysfunction 09/02/2011  . ERECTILE DYSFUNCTION, SECONDARY TO MEDICATION 04/14/2010  . Type 2 diabetes mellitus with vascular disease ([place]) 10/02/2009  . ACUT MI SUBENDOCARDIAL INFARCT INIT EPIS CARE 10/02/2009  . CORONARY ATHEROSCLEROSIS NATIVE CORONARY ARTERY 10/02/2009  . CHEST PAIN 10/02/2009    Floria Raveling. Hartnett-Rands, MS, PT Per [place] #[id number] 08/23/2017, 3:28 PM  [place] [address] [place], [place], [address] Phone: [phone]   Fax:  [phone]  Name: Alex Cortez MRN: [id number] Date of Birth: [birth year]

## 2017-08-23 NOTE — Therapy (Signed)
[place], [place], [address] Phone: [phone]   Fax:  [phone]  Occupational Therapy Treatment  Patient Details  Name: Alex Cortez MRN: [id number] Date of Birth: 03-10-1951 Referring Provider: Lalla Brothers, MD   Encounter Date: 08/23/2017  OT End of Session - 08/23/17 1509    Visit Number  5    Number of Visits  16    Date for OT Re-Evaluation  10/08/17 mini-reassessment 09/10/2017    Authorization Type  1) Medicare A & B; 2) BCBS    Authorization Time Period  BCBS: 75 visits/year; 08/18/17: Discussed with speech and PT, speech will use 3, OT will use 35, and PT will use 35    Authorization - Visit Number  5    Authorization - Number of Visits  35    OT Start Time  1351    OT Stop Time  1430    OT Time Calculation (min)  39 min    Activity Tolerance  Patient tolerated treatment well    Behavior During Therapy  WFL for tasks assessed/performed       Past Medical History:  Diagnosis Date  . CAD (coronary artery disease)    nonobstructive CAD/patent LAD stent site; EF 65%, 08/2011; NSTEMI/DES LAD, 6/20 11  . Chest pain   . Diabetes mellitus    insulin-dependent  . Dyslipidemia   . HTN (hypertension)   . Post MI syndrome ([place]) 08-29-2009  . Stroke [facility]) 10/22/2012   Left inferior pons    Past Surgical History:  Procedure Laterality Date  . CORONARY ANGIOPLASTY WITH STENT PLACEMENT      There were no vitals filed for this visit.  Subjective Assessment - 08/23/17 1347    Subjective   S: I could button my shirt on Sunday.     Currently in Pain?  No/denies         [facility] OT Assessment - 08/23/17 1347      Assessment   Medical Diagnosis  s/p L CVA      Precautions   Precautions  None               OT Treatments/Exercises (OP) - 08/23/17 1349      Exercises   Exercises  Wrist;Hand;Shoulder;Elbow;Theraputty      Shoulder Exercises: Seated   Protraction  Strengthening;12 reps     Protraction Weight (lbs)  1    Horizontal ABduction  Strengthening;12 reps    Horizontal ABduction Weight (lbs)  1    External Rotation  Strengthening;12 reps    External Rotation Weight (lbs)  1    Internal Rotation  Strengthening;12 reps    Internal Rotation Weight (lbs)  1    Flexion  Strengthening;12 reps    Flexion Weight (lbs)  1    Abduction  Strengthening;12 reps    ABduction Weight (lbs)  1      Shoulder Exercises: Standing   Extension  Theraband;10 reps    Theraband Level (Shoulder Extension)  Level 2 (Red)    Row  Theraband;10 reps    Theraband Level (Shoulder Row)  Level 2 (Red)    Retraction  Theraband;10 reps    Theraband Level (Shoulder Retraction)  Level 2 (Red)      Shoulder Exercises: Therapy Ball   Other Therapy Ball Exercises  green striped therapy ball: chest press, overhead press, flexion, 10X each       Shoulder Exercises: ROM/Strengthening   X to V Arms  12X, 1#      Additional Elbow Exercises   Hand Gripper with Large Beads  all beads gripper at 37#    Hand Gripper with Medium Beads  all beads gripper at 37#    Hand Gripper with Small Beads  all beads gripper at 37#      Wrist Exercises   Wrist Extension  Strengthening;15 reps    Bar Weights/Barbell (Wrist Extension)  1 lb    Wrist Radial Deviation  Strengthening;15 reps    Bar Weights/Barbell (Radial Deviation)  1 lb    Wrist Ulnar Deviation  Strengthening;15 reps    Bar Weights/Barbell (Ulnar Deviation)  1 lb      Fine Motor Coordination (Hand/Wrist)   Fine Motor Coordination  Picking up coins;Manipulating coins    Picking up coins  pt picked up 10 coins off tabletop using tip pinch, min difficulty    Manipulating coins  Pt held 5 and 10 coins in palm of hand and worked on translating to fingertips and then placing in slotted container. Mod difficulty with translation          Balance Exercises - 08/23/17 1509      Balance Exercises: Standing   Tandem Stance  Eyes open;Foam/compliant  surface;3 reps;30 secs    Sidestepping  Theraband;2 reps GTB          OT Short Term Goals - 08/11/17 1140      OT SHORT TERM GOAL #1   Title  Pt will be provided with and educated on HEP to promote improved use of RUE as dominant during B/IADLs.     Time  4    Period  Weeks    Status  On-going      OT SHORT TERM GOAL #2   Title  Pt will improve RUE strength to 4/5 to improve ability to perform dressing tasks using RUE as dominant.     Time  4    Period  Weeks    Status  On-going      OT SHORT TERM GOAL #3   Title  Pt will improve right grip strength by 10# and pinch strength by 3# to improve ability to hold eating utensils.     Time  4    Period  Weeks    Status  On-going      OT SHORT TERM GOAL #4   Title  Pt will improve right coordination required for eating tasks by completing 9 hole peg test in 2.5 minutes or less.     Time  4    Period  Weeks    Status  On-going        OT Long Term Goals - 08/11/17 1141      OT LONG TERM GOAL #1   Title  Pt will return to highest level of functioning and independence using RUE as dominant for B/ADL tasks.     Time  8    Period  Weeks    Status  On-going      OT LONG TERM GOAL #2   Title  Pt will improve RUE strength to 4+/5 or greater to improve ability to perform yardwork tasks using RUE as dominant.     Time  8    Period  Weeks    Status  On-going      OT LONG TERM GOAL #3   Title  Pt will improve right grip strength by 18# and pinch strength by 5# to improve ability to hold and carry  objects with right hand.     Time  8    Period  Weeks    Status  On-going      OT LONG TERM GOAL #4   Title  Pt will improve right fine motor coordination required for grooming tasks such as shaving by completing 9 hole peg test in under 1 minute.     Time  4    Period  Weeks    Status  On-going            Plan - 08/23/17 1510    Clinical Impression Statement  A: Pt reports he was able to button his shirt this Sunday whereas  he was unable to the previous Sunday. Continued with RUE strengthening adding green therapy ball and scapular theraband exercises today. Also added fine motor task with coin manipulation, pt with mod difficulty verbal cuing for not shaking hand. Verbal cuing during tasks for form and technique.     Plan  P: Continue with strengthening, add small pegboard task       Patient will benefit from skilled therapeutic intervention in order to improve the following deficits and impairments:  Decreased activity tolerance, Decreased strength, Decreased range of motion, Decreased coordination, Impaired UE functional use, Increased fascial restrictions  Visit Diagnosis: Other lack of coordination  Other symptoms and signs involving the musculoskeletal system    Problem List Patient Active Problem List   Diagnosis Date Noted  . Cerebral embolism with cerebral infarction 08/01/2017  . CVA (cerebral vascular accident) ([place]) 08/01/2017  . History of CVA with residual deficit   . Coronary artery disease involving native coronary artery of native heart without angina pectoris   . Essential hypertension   . Diabetes mellitus type 2 in obese ([place])   . Class 1 obesity due to excess calories with serious comorbidity and body mass index (BMI) of 32.0 to 32.9 in adult 09/09/2015  . Sedentary lifestyle 09/09/2015  . Hyperlipidemia 02/24/2015  . Essential hypertension, benign 02/24/2015  . Exertional dyspnea 10/18/2012  . Diastolic heart failure ([place]) 10/01/2011  . Obstructive sleep apnea 09/02/2011  . LV dysfunction 09/02/2011  . ERECTILE DYSFUNCTION, SECONDARY TO MEDICATION 04/14/2010  . Type 2 diabetes mellitus with vascular disease ([place]) 10/02/2009  . ACUT MI SUBENDOCARDIAL INFARCT INIT EPIS CARE 10/02/2009  . CORONARY ATHEROSCLEROSIS NATIVE CORONARY ARTERY 10/02/2009  . CHEST PAIN 10/02/2009   Guadelupe Sabin, OTR/L  [phone] 08/23/2017, 3:19 PM  [facility] [address] [place], [place], [address] Phone: [phone]   Fax:  [phone]  Name: Alex Cortez MRN: [id number] Date of Birth: 01-May-1950

## 2017-08-23 NOTE — Patient Outreach (Signed)
[place] [facility]) Care Management  08/23/2017  Alex Cortez 17-Feb-1951 [id number]  EMMI: stroke follow up Referral date: 08/18/17 Insurance: Medicare  Telephone call to patient for EMMI stroke follow up. HIPAA verified with patient. Patient states he has not heard from the doctor regarding his event monitor. Patient states he is scheduled to see his cardiologist on 08/31/17.  RNCM advised patient to ask his cardiologist if event monitor is needed.  Patient verbalized understanding. Patient denies having any further needs or concerns at this time.   PLAN:  RNCM will close patient due to patient being assessed and having no further needs.   Quinn Plowman RN,BSN,CCM [facility] Telephonic  [phone]

## 2017-08-23 NOTE — Therapy (Signed)
[place], [place], [address] Phone: [phone]   Fax:  [phone]  Speech Language Pathology Treatment  Patient Details  Name: Alex Cortez MRN: [id number] Date of Birth: 01-May-1950 Referring Provider: Lalla Brothers, MD   Encounter Date: 08/23/2017  End of Session - 08/23/17 1414    Visit Number  2    Number of Visits  3    Authorization Type  Medicare; [place]  Medicare Part A and B, BCBS Federal Emp PPO (PT/OT/SLP = 75 visits per calender year combined), no auth required     Authorization - Visit Number  9    Authorization - Number of Visits  75 across all disciplines    SLP Start Time  1310    SLP Stop Time   1348    SLP Time Calculation (min)  38 min    Activity Tolerance  Patient tolerated treatment well       Past Medical History:  Diagnosis Date  . CAD (coronary artery disease)    nonobstructive CAD/patent LAD stent site; EF 65%, 08/2011; NSTEMI/DES LAD, 6/20 11  . Chest pain   . Diabetes mellitus    insulin-dependent  . Dyslipidemia   . HTN (hypertension)   . Post MI syndrome ([place]) 08-29-2009  . Stroke [facility]) 10/22/2012   Left inferior pons    Past Surgical History:  Procedure Laterality Date  . CORONARY ANGIOPLASTY WITH STENT PLACEMENT      There were no vitals filed for this visit.  Subjective Assessment - 08/23/17 1411    Subjective  "I am doing well."    Currently in Pain?  No/denies            ADULT SLP TREATMENT - 08/23/17 1411      General Information   Behavior/Cognition  Alert;Cooperative;Pleasant mood    Patient Positioning  Upright in chair    Oral care provided  N/A    HPI  Pt is a 67 y/o male s/p left CVA on 07/30/17 presenting with right hemiparesis. Pt received SLP services while in acute care at [facility], discharged home with wife assisting with ADLs. Pt was referred to speech pathology by Dr. Lalla Brothers.      Treatment Provided   Treatment provided   Cognitive-Linquistic      Pain Assessment   Pain Assessment  No/denies pain      Cognitive-Linquistic Treatment   Treatment focused on  Cognition;Patient/family/caregiver education;Aphasia    Skilled Treatment  Pt provided skilled instruction regarding memory and word find finding strategies.      Assessment / Recommendations / Plan   Plan  Continue with current plan of care      Progression Toward Goals   Progression toward goals  Progressing toward goals       SLP Education - 08/23/17 1413    Education Details  Provided Pt with folder including memory and word finding strategies    Person(s) Educated  Patient    Methods  Explanation;Handout    Comprehension  Verbalized understanding       SLP Short Term Goals - 08/23/17 1415      SLP SHORT TERM GOAL #1   Title  Pt will identify 3 compensatory memory strategies which he feels will be effective to use during functional memory activities after review with SLP.     Baseline  not using    Time  1    Period  Weeks    Status  On-going      SLP SHORT TERM GOAL #2   Title  Pt will complete functional memory exercises with 90% acc with use of compensatory strategies as needed.     Baseline  66%    Time  1    Period  Weeks    Status  On-going      SLP SHORT TERM GOAL #3   Title  Pt will increase divergent naming for concrete categories to 7+ items with min assist for use of strategies.     Baseline  3    Time  1    Period  Weeks    Status  On-going       SLP Long Term Goals - 08/18/17 1709      SLP LONG TERM GOAL #1   Title  Same as short       Plan - 08/23/17 1414    Clinical Impression Statement Pt reports no changes at home. He was provided with memory and word finding strategies which were reviewed at length and applied to personal situations. Pt was able to identify appropriate strategies with min assist. During word description tasks, Pt was successful 95% of the time in implementation of strategies. Next  session, apply memory strategies to functional tasks.    Speech Therapy Frequency  2x / week    Duration  1 week    Treatment/Interventions  SLP instruction and feedback;Compensatory strategies;Compensatory techniques;Patient/family education;Cueing hierarchy;Multimodal communcation approach    Potential to Achieve Goals  Good    SLP Home Exercise Plan  Pt will completed HEP as assigned to facilitate carryover of treatment strategies and techniques in home environment.    Consulted and Agree with Plan of Care  Patient;Family member/caregiver    Family Member Consulted  Spouse, Kaenan Jake       Patient will benefit from skilled therapeutic intervention in order to improve the following deficits and impairments:   Cognitive communication deficit    Problem List Patient Active Problem List   Diagnosis Date Noted  . Cerebral embolism with cerebral infarction 08/01/2017  . CVA (cerebral vascular accident) ([place]) 08/01/2017  . History of CVA with residual deficit   . Coronary artery disease involving native coronary artery of native heart without angina pectoris   . Essential hypertension   . Diabetes mellitus type 2 in obese ([place])   . Class 1 obesity due to excess calories with serious comorbidity and body mass index (BMI) of 32.0 to 32.9 in adult 09/09/2015  . Sedentary lifestyle 09/09/2015  . Hyperlipidemia 02/24/2015  . Essential hypertension, benign 02/24/2015  . Exertional dyspnea 10/18/2012  . Diastolic heart failure ([place]) 10/01/2011  . Obstructive sleep apnea 09/02/2011  . LV dysfunction 09/02/2011  . ERECTILE DYSFUNCTION, SECONDARY TO MEDICATION 04/14/2010  . Type 2 diabetes mellitus with vascular disease ([place]) 10/02/2009  . ACUT MI SUBENDOCARDIAL INFARCT INIT EPIS CARE 10/02/2009  . CORONARY ATHEROSCLEROSIS NATIVE CORONARY ARTERY 10/02/2009  . CHEST PAIN 10/02/2009   Thank you,  Genene Churn, [place]  [facility] 08/23/2017, 2:15 PM  [place], [place], [address] Phone: [phone]   Fax:  [phone]   Name: Alex Cortez MRN: [id number] Date of Birth: Sep 25, 1950

## 2017-08-25 ENCOUNTER — Encounter (HOSPITAL_COMMUNITY): Payer: Self-pay

## 2017-08-25 ENCOUNTER — Encounter (HOSPITAL_COMMUNITY): Payer: Self-pay | Admitting: Occupational Therapy

## 2017-08-25 ENCOUNTER — Ambulatory Visit (HOSPITAL_COMMUNITY): Payer: Medicare Other | Admitting: Speech Pathology

## 2017-08-25 ENCOUNTER — Ambulatory Visit (HOSPITAL_COMMUNITY): Payer: Medicare Other

## 2017-08-25 ENCOUNTER — Ambulatory Visit (HOSPITAL_COMMUNITY): Payer: Medicare Other | Admitting: Occupational Therapy

## 2017-08-25 ENCOUNTER — Encounter (HOSPITAL_COMMUNITY): Payer: Self-pay | Admitting: Speech Pathology

## 2017-08-25 DIAGNOSIS — R41841 Cognitive communication deficit: Secondary | ICD-10-CM | POA: Diagnosis not present

## 2017-08-25 DIAGNOSIS — I69351 Hemiplegia and hemiparesis following cerebral infarction affecting right dominant side: Secondary | ICD-10-CM

## 2017-08-25 DIAGNOSIS — R29898 Other symptoms and signs involving the musculoskeletal system: Secondary | ICD-10-CM | POA: Diagnosis not present

## 2017-08-25 DIAGNOSIS — R2689 Other abnormalities of gait and mobility: Secondary | ICD-10-CM

## 2017-08-25 DIAGNOSIS — R278 Other lack of coordination: Secondary | ICD-10-CM | POA: Diagnosis not present

## 2017-08-25 NOTE — Therapy (Signed)
[place], [place], [address] Phone: [phone]   Fax:  [phone]  Speech Language Pathology Treatment  Patient Details  Name: Alex Cortez MRN: [id number] Date of Birth: [birth year] Referring Provider: Lalla Brothers, MD   Encounter Date: 08/25/2017  End of Session - 08/25/17 1316    Visit Number  3    Number of Visits  3    Authorization Type  Medicare; [place]  Medicare Part A and B, BCBS Federal Emp PPO (PT/OT/SLP = 75 visits per calender year combined), no auth required     Authorization - Visit Number  10    Authorization - Number of Visits  75 across all disciplines    SLP Start Time  1215    SLP Stop Time   1300    SLP Time Calculation (min)  45 min    Activity Tolerance  Patient tolerated treatment well       Past Medical History:  Diagnosis Date  . CAD (coronary artery disease)    nonobstructive CAD/patent LAD stent site; EF 65%, 08/2011; NSTEMI/DES LAD, 6/20 11  . Chest pain   . Diabetes mellitus    insulin-dependent  . Dyslipidemia   . HTN (hypertension)   . Post MI syndrome ([place]) 08-29-2009  . Stroke [facility]) 10/22/2012   Left inferior pons    Past Surgical History:  Procedure Laterality Date  . CORONARY ANGIOPLASTY WITH STENT PLACEMENT      There were no vitals filed for this visit.  Subjective Assessment - 08/25/17 1238    Subjective  "I got worried that I misplaced some money."    Currently in Pain?  No/denies            ADULT SLP TREATMENT - 08/25/17 1313      General Information   Behavior/Cognition  Alert;Cooperative;Pleasant mood    Patient Positioning  Upright in chair    Oral care provided  N/A    HPI  Pt is a 66 y/o male s/p left CVA on 07/30/17 presenting with right hemiparesis. Pt received SLP services while in acute care at [facility], discharged home with wife assisting with ADLs. Pt was referred to speech pathology by Dr. Lalla Brothers.      Treatment  Provided   Treatment provided  Cognitive-Linquistic      Pain Assessment   Pain Assessment  No/denies pain      Cognitive-Linquistic Treatment   Treatment focused on  Cognition;Patient/family/caregiver education;Aphasia    Skilled Treatment  Ongoing reinforcement of memorry, organization, and word finding strategies with implementation during fucntional tasks      Assessment / Recommendations / Plan   Plan  Discharge SLP treatment due to (comment);All goals met         SLP Short Term Goals - 08/25/17 1316      SLP SHORT TERM GOAL #1   Title  Pt will identify 3 compensatory memory strategies which he feels will be effective to use during functional memory activities after review with SLP.     Baseline  not using    Time  1    Period  Weeks    Status  Achieved      SLP SHORT TERM GOAL #2   Title  Pt will complete functional memory exercises with 90% acc with use of compensatory strategies as needed.     Baseline  66%    Time  1    Period  Weeks  Status  Achieved      SLP SHORT TERM GOAL #3   Title  Pt will increase divergent naming for concrete categories to 7+ items with min assist for use of strategies.     Baseline  3    Time  1    Period  Weeks    Status  Achieved       SLP Long Term Goals - 08/18/17 1709      SLP LONG TERM GOAL #1   Title  Same as short       Plan - 08/25/17 1316    Clinical Impression Statement Pt accompanied to SLP therapy by his wife. SLP provided ongoing education regarding memory strategies and asked Pt to identify situations at home where strategies could be applied. 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 He required min cues to provided multiple solutions beyond one. He completed divergent naming tasks with indirect assist to expand in abstract categories (met for concrete). Pt and wife feel pleased with progress and plan to continue incorporating treatment strategies  and techniques in home environment. No further SLP services indicated at this time.    Speech Therapy Frequency  2x / week    Duration  1 week    Treatment/Interventions  SLP instruction and feedback;Compensatory strategies;Compensatory techniques;Patient/family education;Cueing hierarchy;Multimodal communcation approach    Potential to Achieve Goals  Good    SLP Home Exercise Plan  Pt will completed HEP as assigned to facilitate carryover of treatment strategies and techniques in home environment.    Consulted and Agree with Plan of Care  Patient;Family member/caregiver    Family Member Consulted  Spouse, Bernardino Dowell       Patient will benefit from skilled therapeutic intervention in order to improve the following deficits and impairments:   Cognitive communication deficit    Problem List Patient Active Problem List   Diagnosis Date Noted  . Cerebral embolism with cerebral infarction 08/01/2017  . CVA (cerebral vascular accident) ([place]) 08/01/2017  . History of CVA with residual deficit   . Coronary artery disease involving native coronary artery of native heart without angina pectoris   . Essential hypertension   . Diabetes mellitus type 2 in obese ([place])   . Class 1 obesity due to excess calories with serious comorbidity and body mass index (BMI) of 32.0 to 32.9 in adult 09/09/2015  . Sedentary lifestyle 09/09/2015  . Hyperlipidemia 02/24/2015  . Essential hypertension, benign 02/24/2015  . Exertional dyspnea 10/18/2012  . Diastolic heart failure ([place]) 10/01/2011  . Obstructive sleep apnea 09/02/2011  . LV dysfunction 09/02/2011  . ERECTILE DYSFUNCTION, SECONDARY TO MEDICATION 04/14/2010  . Type 2 diabetes mellitus with vascular disease ([place]) 10/02/2009  . ACUT MI SUBENDOCARDIAL INFARCT INIT EPIS CARE 10/02/2009  . CORONARY ATHEROSCLEROSIS NATIVE CORONARY ARTERY 10/02/2009  . CHEST PAIN 10/02/2009    SPEECH THERAPY DISCHARGE SUMMARY  Visits from Start of Care: 3  Current  functional level related to goals / functional outcomes: Met   Remaining deficits: N/A   Education / Equipment: Completed  Plan: Patient agrees to discharge.  Patient goals were met. Patient is being discharged due to meeting the stated rehab goals.  ?????       Thank you,  Genene Churn, [place]  [facility] 08/25/2017, 1:17 PM  [place] [address] [place], [place], [address] Phone: [phone]   Fax:  [phone]  Name: Alex Cortez MRN: [id number] Date of Birth: [birth year]

## 2017-08-25 NOTE — Patient Instructions (Signed)
Tandem Stance    Right foot in front of left, heel touching toe both feet "straight ahead". Stand on Foot Triangle of Support with both feet. Balance in this position 30 seconds. Do with left foot in front of right.  Copyright  VHI. All rights reserved.   FUNCTIONAL MOBILITY: Squat    Stance: shoulder-width on floor. Stand infront of chair.  Bend hips and knees. Keep back straight. Do not allow knees to bend past toes. Squeeze glutes and quads to stand. 10 reps per set, 1-2 sets per day.  Copyright  VHI. All rights reserved.

## 2017-08-25 NOTE — Therapy (Signed)
[place], [place], [address] Phone: [phone]   Fax:  [phone]  Occupational Therapy Treatment  Patient Details  Name: Alex Cortez MRN: [id number] Date of Birth: 06-22-1950 Referring Provider: Lalla Brothers, MD   Encounter Date: 08/25/2017  OT End of Session - 08/25/17 1459    Visit Number  6    Number of Visits  16    Date for OT Re-Evaluation  10/08/17 mini-reassessment 09/10/2017    Authorization Type  1) Medicare A & B; 2) BCBS    Authorization Time Period  BCBS: 75 visits/year; 08/18/17: Discussed with speech and PT, speech will use 3, OT will use 35, and PT will use 35    Authorization - Visit Number  6    Authorization - Number of Visits  35    OT Start Time  1301    OT Stop Time  1345    OT Time Calculation (min)  44 min    Activity Tolerance  Patient tolerated treatment well    Behavior During Therapy  WFL for tasks assessed/performed       Past Medical History:  Diagnosis Date  . CAD (coronary artery disease)    nonobstructive CAD/patent LAD stent site; EF 65%, 08/2011; NSTEMI/DES LAD, 6/20 11  . Chest pain   . Diabetes mellitus    insulin-dependent  . Dyslipidemia   . HTN (hypertension)   . Post MI syndrome ([place]) 08-29-2009  . Stroke [facility]) 10/22/2012   Left inferior pons    Past Surgical History:  Procedure Laterality Date  . CORONARY ANGIOPLASTY WITH STENT PLACEMENT      There were no vitals filed for this visit.  Subjective Assessment - 08/25/17 1301    Subjective   S: I didn't try any coin work.     Currently in Pain?  No/denies                   OT Treatments/Exercises (OP) - 08/25/17 1301      Exercises   Exercises  Wrist;Hand;Shoulder;Elbow;Theraputty      Shoulder Exercises: Seated   Protraction  Strengthening;12 reps    Protraction Weight (lbs)  2    Horizontal ABduction  Strengthening;12 reps    Horizontal ABduction Weight (lbs)  2    Flexion   Strengthening;12 reps    Flexion Weight (lbs)  2              Additional Elbow Exercises   Hand Gripper with Large Beads  all beads gripper at 38#    Hand Gripper with Medium Beads  all beads gripper at 38#    Hand Gripper with Small Beads  all beads gripper at 29#      Wrist Exercises   Wrist Extension  Strengthening;15 reps    Bar Weights/Barbell (Wrist Extension)  2 lbs    Wrist Radial Deviation  Strengthening;15 reps    Bar Weights/Barbell (Radial Deviation)  2 lbs    Wrist Ulnar Deviation  Strengthening;15 reps    Bar Weights/Barbell (Ulnar Deviation)  2 lbs      Fine Motor Coordination (Hand/Wrist)   Fine Motor Coordination  Small Pegboard    Small Pegboard  Pt completed small pegboard task placing pegs in specified pattern. Pt completed first half by picking up one peg at a time and placing in board, for second half pt holding multiple pegs in palm while placing pegs. Pt then removed pegs picking up as many as  he can before dropping in bucket. Pt with mod difficulty when placing, good attempts at using tip pinch. Increased time required for task completion.           Balance Exercises - 08/25/17 1412      Balance Exercises: Standing   Tandem Stance  Eyes open;Foam/compliant surface;4 reps palvo with RTB on foam    SLS  Eyes open;3 reps    SLS with Vectors  Solid surface;3 reps;Intermittent upper extremity assist 3x 5" intermittent HHA    Tandem Gait  2 reps    Retro Gait  2 reps    Sidestepping  Theraband;2 reps          OT Short Term Goals - 08/11/17 1140      OT SHORT TERM GOAL #1   Title  Pt will be provided with and educated on HEP to promote improved use of RUE as dominant during B/IADLs.     Time  4    Period  Weeks    Status  On-going      OT SHORT TERM GOAL #2   Title  Pt will improve RUE strength to 4/5 to improve ability to perform dressing tasks using RUE as dominant.     Time  4    Period  Weeks    Status  On-going      OT SHORT TERM GOAL #3    Title  Pt will improve right grip strength by 10# and pinch strength by 3# to improve ability to hold eating utensils.     Time  4    Period  Weeks    Status  On-going      OT SHORT TERM GOAL #4   Title  Pt will improve right coordination required for eating tasks by completing 9 hole peg test in 2.5 minutes or less.     Time  4    Period  Weeks    Status  On-going        OT Long Term Goals - 08/11/17 1141      OT LONG TERM GOAL #1   Title  Pt will return to highest level of functioning and independence using RUE as dominant for B/ADL tasks.     Time  8    Period  Weeks    Status  On-going      OT LONG TERM GOAL #2   Title  Pt will improve RUE strength to 4+/5 or greater to improve ability to perform yardwork tasks using RUE as dominant.     Time  8    Period  Weeks    Status  On-going      OT LONG TERM GOAL #3   Title  Pt will improve right grip strength by 18# and pinch strength by 5# to improve ability to hold and carry objects with right hand.     Time  8    Period  Weeks    Status  On-going      OT LONG TERM GOAL #4   Title  Pt will improve right fine motor coordination required for grooming tasks such as shaving by completing 9 hole peg test in under 1 minute.     Time  4    Period  Weeks    Status  On-going            Plan - 08/25/17 1459    Clinical Impression Statement  A: Pt reports he didn't try any coin manipulation since last session.  Added small pegboard task for fine motor coordination today, pt requiring significantly increased time for peg placment. Increased wrist and shoulder strengthening to 2# today, pt with min difficulty giving good effort at straightening shoulder at end range. Pt with difficulty during gripper task with small beads, OT reducing weight resistance for completion. Verbal cuing for technique during exercises and activities.     Plan  P: Complete small beads first with gripper attempting at 35# or 37#, resumed missed shoulder  strengthening exercises and update HEP       Patient will benefit from skilled therapeutic intervention in order to improve the following deficits and impairments:  Decreased activity tolerance, Decreased strength, Decreased range of motion, Decreased coordination, Impaired UE functional use, Increased fascial restrictions  Visit Diagnosis: Other lack of coordination  Other symptoms and signs involving the musculoskeletal system    Problem List Patient Active Problem List   Diagnosis Date Noted  . Cerebral embolism with cerebral infarction 08/01/2017  . CVA (cerebral vascular accident) ([place]) 08/01/2017  . History of CVA with residual deficit   . Coronary artery disease involving native coronary artery of native heart without angina pectoris   . Essential hypertension   . Diabetes mellitus type 2 in obese ([place])   . Class 1 obesity due to excess calories with serious comorbidity and body mass index (BMI) of 32.0 to 32.9 in adult 09/09/2015  . Sedentary lifestyle 09/09/2015  . Hyperlipidemia 02/24/2015  . Essential hypertension, benign 02/24/2015  . Exertional dyspnea 10/18/2012  . Diastolic heart failure ([place]) 10/01/2011  . Obstructive sleep apnea 09/02/2011  . LV dysfunction 09/02/2011  . ERECTILE DYSFUNCTION, SECONDARY TO MEDICATION 04/14/2010  . Type 2 diabetes mellitus with vascular disease ([place]) 10/02/2009  . ACUT MI SUBENDOCARDIAL INFARCT INIT EPIS CARE 10/02/2009  . CORONARY ATHEROSCLEROSIS NATIVE CORONARY ARTERY 10/02/2009  . CHEST PAIN 10/02/2009   Guadelupe Sabin, OTR/L  [phone] 08/25/2017, 3:02 PM  [place] [address] [place], [place], [address] Phone: [phone]   Fax:  [phone]  Name: Alex Cortez MRN: [id number] Date of Birth: [birth year]

## 2017-08-25 NOTE — Therapy (Signed)
[place], [place], [address] Phone: [phone]   Fax:  [phone]  Physical Therapy Treatment  Patient Details  Name: Alex Cortez MRN: [id number] Date of Birth: [birth year] Referring Provider: Lalla Brothers, MD   Encounter Date: 08/25/2017  PT End of Session - 08/25/17 1349    Visit Number  5    Number of Visits  9    Date for PT Re-Evaluation  09/08/17    Authorization Type  Medicare Part A and B, BCBS Federal Emp PPO (PT/OT/SLP = 75 visits per calender year combined), no auth required    Authorization Time Period  08/11/2017 - 09/08/2017    Authorization - Visit Number  5    Authorization - Number of Visits  -- 75 total visits allowed.  3 sessions for SLP; divided PT/OT     PT Start Time  1347    PT Stop Time  1427    PT Time Calculation (min)  40 min    Equipment Utilized During Treatment  Gait belt    Activity Tolerance  Patient tolerated treatment well;Patient limited by fatigue    Behavior During Therapy  WFL for tasks assessed/performed       Past Medical History:  Diagnosis Date  . CAD (coronary artery disease)    nonobstructive CAD/patent LAD stent site; EF 65%, 08/2011; NSTEMI/DES LAD, 6/20 11  . Chest pain   . Diabetes mellitus    insulin-dependent  . Dyslipidemia   . HTN (hypertension)   . Post MI syndrome ([place]) 08-29-2009  . Stroke [facility]) 10/22/2012   Left inferior pons    Past Surgical History:  Procedure Laterality Date  . CORONARY ANGIOPLASTY WITH STENT PLACEMENT      There were no vitals filed for this visit.  Subjective Assessment - 08/25/17 1348    Subjective  Pt stated he is doing well today, no reorts of pain.  Reports compliance wiht HEP, continues to have difficutly wtih SLS balance activities.      Pertinent History  DM, HTN, CAD, multiple strokes (4)    Patient Stated Goals  return to walking more normally again and get back to normal activities like bowling     Currently in Pain?  No/denies                       [facility] Adult PT Treatment/Exercise - 08/25/17 1410      Knee/Hip Exercises: Standing   Heel Raises  20 reps    Heel Raises Limitations  Toe raises 15x incline slope    Forward Lunges  Both;10 reps    Forward Lunges Limitations  on floor,cueing  for form    Lateral Step Up  Right;15 reps;Hand Hold: 1;Step Height: 6"    Forward Step Up  Right;15 reps;Hand Hold: 1;Step Height: 6"    Functional Squat  15 reps in front of chair, cueing for equal stance phase    SLS  Rt 22", Lt 7" max of 3    SLS with Vectors  3x 5" BLe with intermittent HHA            Balance Exercises - 08/25/17 1412      Balance Exercises: Standing   Tandem Stance  Eyes open;Foam/compliant surface;4 reps palvo with RTB on foam    SLS  Eyes open;3 reps    SLS with Vectors  Solid surface;3 reps;Intermittent upper extremity assist 3x 5" intermittent HHA    Tandem  Gait  2 reps    Retro Gait  2 reps    Sidestepping  Theraband;2 reps          PT Short Term Goals - 08/23/17 1526      PT SHORT TERM GOAL #1   Title  Patient will be independent with HEP to progress functional LE strength and balance, updated PRN    Time  2    Period  Weeks    Status  On-going        PT Long Term Goals - 08/23/17 1525      PT LONG TERM GOAL #1   Title  Patient will improve MMT for all limited groups to 5/5 to demonstrate improved functional strength and muscle activation.    Time  4    Period  Weeks    Status  On-going      PT LONG TERM GOAL #2   Title  Patient will improve TUG to be = or < 12 seconds to demonstrate increased safety with gait activities and reduced fall risk.    Time  4    Period  Weeks    Status  On-going      PT LONG TERM GOAL #3   Title  Patient will improve DGI to 21/24 or greater to demosntrate significant improvement in dynamic/functional balance during gait and indicate reduced fall risk.    Time  4    Period  Weeks    Status   On-going      PT LONG TERM GOAL #4   Title  Patient will perform 5 time sit to stand in 14 seconds or less to demonstrate significant change in functional strength to improve mobility.    Time  4    Period  Weeks    Status  On-going      PT LONG TERM GOAL #5   Title  Patient will ambulate at 1.2 m/s during 2 MWT to demonstrate improved gait velocity, increaed community access, increased independenc, and reduced risk of falling.    Time  4    Period  Weeks    Status  On-going            Plan - 08/25/17 1528    Clinical Impression Statement  Continued session foucs iwht functional strengthening and balance training.  Added retro gait for balance training and progressed balance/strengthening exercises.  Pt presents with weak gluteal muscualture noted by difficulty wiht SLS, added vector stance for hip stability in SLS with intermittent HHA needed.  Progressed lunges to on the floor for increased weight bearing with cueing for posture with exercise.  Advanced HEP with additional tandem stance and squats, encouraged to complete by counter and infront of chair wiht verbalized understanding.  No reports of pain through session, was limited by fatigue with exercises.      Rehab Potential  Good    PT Frequency  2x / week    PT Duration  4 weeks    PT Treatment/Interventions  ADLs/Self Care Home Management;Electrical Stimulation;Gait training;Stair training;Functional mobility training;Therapeutic activities;Therapeutic exercise;Balance training;Neuromuscular re-education;Patient/family education;Manual techniques;Passive range of motion    PT Next Visit Plan  Begin step down training and advance strengthening exercises with dynamic surfaces as appropriate.  Progress balance training and functional strengthening as able.      PT Home Exercise Plan  Eval: bridge, sit to stands, SLS at counter; 08/24/17: squats infront of chair and tandem stance by counter.     Consulted and Agree with Plan  of Care   Patient;Family member/caregiver    Family Member Consulted  wife, Adonis Huguenin       Patient will benefit from skilled therapeutic intervention in order to improve the following deficits and impairments:  Abnormal gait, Decreased activity tolerance, Decreased balance, Decreased mobility, Decreased strength, Improper body mechanics, Obesity, Postural dysfunction, Difficulty walking, Decreased endurance  Visit Diagnosis: Hemiplegia and hemiparesis following cerebral infarction affecting right dominant side (HCC)  Other abnormalities of gait and mobility     Problem List Patient Active Problem List   Diagnosis Date Noted  . Cerebral embolism with cerebral infarction 08/01/2017  . CVA (cerebral vascular accident) ([place]) 08/01/2017  . History of CVA with residual deficit   . Coronary artery disease involving native coronary artery of native heart without angina pectoris   . Essential hypertension   . Diabetes mellitus type 2 in obese ([place])   . Class 1 obesity due to excess calories with serious comorbidity and body mass index (BMI) of 32.0 to 32.9 in adult 09/09/2015  . Sedentary lifestyle 09/09/2015  . Hyperlipidemia 02/24/2015  . Essential hypertension, benign 02/24/2015  . Exertional dyspnea 10/18/2012  . Diastolic heart failure ([place]) 10/01/2011  . Obstructive sleep apnea 09/02/2011  . LV dysfunction 09/02/2011  . ERECTILE DYSFUNCTION, SECONDARY TO MEDICATION 04/14/2010  . Type 2 diabetes mellitus with vascular disease ([place]) 10/02/2009  . ACUT MI SUBENDOCARDIAL INFARCT INIT EPIS CARE 10/02/2009  . CORONARY ATHEROSCLEROSIS NATIVE CORONARY ARTERY 10/02/2009  . CHEST PAIN 10/02/2009   Ihor Austin, [place]; [place]  Aldona Lento 08/25/2017, 3:34 PM  [place], [place], [address] Phone: [phone]   Fax:  [phone]  Name: RAMESH MOAN MRN: [id number] Date of Birth: 07-Nov-1950

## 2017-08-29 ENCOUNTER — Telehealth: Payer: Self-pay | Admitting: "Endocrinology

## 2017-08-29 MED ORDER — FREESTYLE LIBRE 14 DAY SENSOR MISC: 1.0000 | 2 refills | Status: DC

## 2017-08-29 NOTE — Telephone Encounter (Signed)
Alex Cortez is asking for a Rx be sent to [facility] for his 14 day Libre sensors, please advise?

## 2017-08-30 ENCOUNTER — Ambulatory Visit (HOSPITAL_COMMUNITY): Payer: Medicare Other

## 2017-08-30 ENCOUNTER — Encounter (HOSPITAL_COMMUNITY): Payer: Self-pay

## 2017-08-30 ENCOUNTER — Other Ambulatory Visit: Payer: Self-pay

## 2017-08-30 ENCOUNTER — Ambulatory Visit (HOSPITAL_COMMUNITY): Payer: Medicare Other | Admitting: Occupational Therapy

## 2017-08-30 ENCOUNTER — Encounter (HOSPITAL_COMMUNITY): Payer: Self-pay | Admitting: Occupational Therapy

## 2017-08-30 DIAGNOSIS — I69351 Hemiplegia and hemiparesis following cerebral infarction affecting right dominant side: Secondary | ICD-10-CM

## 2017-08-30 DIAGNOSIS — R278 Other lack of coordination: Secondary | ICD-10-CM | POA: Diagnosis not present

## 2017-08-30 DIAGNOSIS — R41841 Cognitive communication deficit: Secondary | ICD-10-CM | POA: Diagnosis not present

## 2017-08-30 DIAGNOSIS — R29898 Other symptoms and signs involving the musculoskeletal system: Secondary | ICD-10-CM | POA: Diagnosis not present

## 2017-08-30 DIAGNOSIS — R2689 Other abnormalities of gait and mobility: Secondary | ICD-10-CM | POA: Diagnosis not present

## 2017-08-30 NOTE — Patient Instructions (Signed)
Repeat all exercises 10-15 times, 1-2 times per day. Use a 1-2lb weight (soup can or water bottle)  1) Shoulder Protraction    Begin with elbows by your side, slowly "punch" straight out in front of you.      2) Shoulder Flexion  Standing:         Begin with arms at your side with thumbs pointed up, slowly raise both arms up and forward towards overhead.               3) Horizontal abduction/adduction  Standing:           Begin with arms straight out in front of you, bring out to the side in at "T" shape. Keep arms straight entire time.      5) Shoulder Abduction  Standing:        Slowly move your arms out to the side so that they go overhead, in a jumping jack or snow angel movement.

## 2017-08-30 NOTE — Therapy (Signed)
[place], [place], [address] Phone: [phone]   Fax:  [phone]  Occupational Therapy Treatment  Patient Details  Name: Alex Cortez MRN: [id number] Date of Birth: [birth year] Referring Provider: Lalla Brothers, MD   Encounter Date: 08/30/2017  OT End of Session - 08/30/17 1115    Visit Number  7    Number of Visits  16    Date for OT Re-Evaluation  10/08/17 mini-reassessment 09/10/2017    Authorization Type  1) Medicare A & B; 2) BCBS    Authorization Time Period  BCBS: 75 visits/year; 08/18/17: Discussed with speech and PT, speech will use 3, OT will use 35, and PT will use 35    Authorization - Visit Number  7    Authorization - Number of Visits  35    OT Start Time  0945    OT Stop Time  1030    OT Time Calculation (min)  45 min    Activity Tolerance  Patient tolerated treatment well    Behavior During Therapy  WFL for tasks assessed/performed       Past Medical History:  Diagnosis Date  . CAD (coronary artery disease)    nonobstructive CAD/patent LAD stent site; EF 65%, 08/2011; NSTEMI/DES LAD, 6/20 11  . Chest pain   . Diabetes mellitus    insulin-dependent  . Dyslipidemia   . HTN (hypertension)   . Post MI syndrome ([place]) 08-29-2009  . Stroke [facility]) 10/22/2012   Left inferior pons    Past Surgical History:  Procedure Laterality Date  . CORONARY ANGIOPLASTY WITH STENT PLACEMENT      There were no vitals filed for this visit.  Subjective Assessment - 08/30/17 0948    Subjective   S: I had to put some pennies in my bank this weekend.     Currently in Pain?  No/denies         [facility] OT Assessment - 08/30/17 0948      Assessment   Medical Diagnosis  s/p L CVA      Precautions   Precautions  None               OT Treatments/Exercises (OP) - 08/30/17 0948      Exercises   Exercises  Wrist;Hand;Shoulder;Elbow      Shoulder Exercises: Seated   Protraction  Strengthening;12  reps    Protraction Weight (lbs)  2    Horizontal ABduction  Strengthening;12 reps    Horizontal ABduction Weight (lbs)  2    Flexion  Strengthening;12 reps    Flexion Weight (lbs)  2    Abduction  Strengthening;12 reps    ABduction Weight (lbs)  2      Shoulder Exercises: ROM/Strengthening   X to V Arms  12X, 2#    Proximal Shoulder Strengthening, Seated  10X each, 2#      Additional Elbow Exercises   Hand Gripper with Medium Beads  all beads gripper at 42#    Hand Gripper with Small Beads  all beads gripper at 29#      Additional Wrist Exercises   Sponges  Pt used green clothespin in lateral pinch to grasp high resistance sponges and stack into 2 towers of 5. Pt attempted in 3 point however was unable to complete due to lack of thumb control and tremor      Fine Motor Coordination (Hand/Wrist)   Fine Motor Coordination  Grooved pegs    Grooved pegs  Pt completed grooved pegs placing one at a time, keeping elbow by his side and manipulating peg into hole using fingertips. Increased time to complete. Pt then removed pegs & held in palm, dropping 4 pegs towards end of task.              OT Education - 08/30/17 0953    Education provided  Yes    Education Details  shoulder strengthening    Person(s) Educated  Patient;Spouse    Methods  Explanation;Demonstration;Handout    Comprehension  Verbalized understanding;Returned demonstration       OT Short Term Goals - 08/11/17 1140      OT SHORT TERM GOAL #1   Title  Pt will be provided with and educated on HEP to promote improved use of RUE as dominant during B/IADLs.     Time  4    Period  Weeks    Status  On-going      OT SHORT TERM GOAL #2   Title  Pt will improve RUE strength to 4/5 to improve ability to perform dressing tasks using RUE as dominant.     Time  4    Period  Weeks    Status  On-going      OT SHORT TERM GOAL #3   Title  Pt will improve right grip strength by 10# and pinch strength by 3# to improve  ability to hold eating utensils.     Time  4    Period  Weeks    Status  On-going      OT SHORT TERM GOAL #4   Title  Pt will improve right coordination required for eating tasks by completing 9 hole peg test in 2.5 minutes or less.     Time  4    Period  Weeks    Status  On-going        OT Long Term Goals - 08/11/17 1141      OT LONG TERM GOAL #1   Title  Pt will return to highest level of functioning and independence using RUE as dominant for B/ADL tasks.     Time  8    Period  Weeks    Status  On-going      OT LONG TERM GOAL #2   Title  Pt will improve RUE strength to 4+/5 or greater to improve ability to perform yardwork tasks using RUE as dominant.     Time  8    Period  Weeks    Status  On-going      OT LONG TERM GOAL #3   Title  Pt will improve right grip strength by 18# and pinch strength by 5# to improve ability to hold and carry objects with right hand.     Time  8    Period  Weeks    Status  On-going      OT LONG TERM GOAL #4   Title  Pt will improve right fine motor coordination required for grooming tasks such as shaving by completing 9 hole peg test in under 1 minute.     Time  4    Period  Weeks    Status  On-going            Plan - 08/30/17 1115    Clinical Impression Statement  A: Pt reports he did work on coin manipulation while putting change in bank this weekend. Resumed shoulder strengthening and updated HEP. Also added grooved pegboard task and sponge stacking  working on Equities trader and coordination. Attempted to increase gripper resistance during small beads however pt unable to complete, did increase during medium beads. Pt requiring increased time for task completion, verbal cuing for form.     Plan  P: Progress note, continue working to improve grip/pinch strength and coordination. resume scapular theraband.        Patient will benefit from skilled therapeutic intervention in order to improve the following deficits and impairments:   Decreased activity tolerance, Decreased strength, Decreased range of motion, Decreased coordination, Impaired UE functional use, Increased fascial restrictions  Visit Diagnosis: Other lack of coordination  Other symptoms and signs involving the musculoskeletal system    Problem List Patient Active Problem List   Diagnosis Date Noted  . Cerebral embolism with cerebral infarction 08/01/2017  . CVA (cerebral vascular accident) ([place]) 08/01/2017  . History of CVA with residual deficit   . Coronary artery disease involving native coronary artery of native heart without angina pectoris   . Essential hypertension   . Diabetes mellitus type 2 in obese ([place])   . Class 1 obesity due to excess calories with serious comorbidity and body mass index (BMI) of 32.0 to 32.9 in adult 09/09/2015  . Sedentary lifestyle 09/09/2015  . Hyperlipidemia 02/24/2015  . Essential hypertension, benign 02/24/2015  . Exertional dyspnea 10/18/2012  . Diastolic heart failure ([place]) 10/01/2011  . Obstructive sleep apnea 09/02/2011  . LV dysfunction 09/02/2011  . ERECTILE DYSFUNCTION, SECONDARY TO MEDICATION 04/14/2010  . Type 2 diabetes mellitus with vascular disease ([place]) 10/02/2009  . ACUT MI SUBENDOCARDIAL INFARCT INIT EPIS CARE 10/02/2009  . CORONARY ATHEROSCLEROSIS NATIVE CORONARY ARTERY 10/02/2009  . CHEST PAIN 10/02/2009   Guadelupe Sabin, OTR/L  [phone] 08/30/2017, 11:18 AM  [place], [place], [address] Phone: [phone]   Fax:  [phone]  Name: Alex Cortez MRN: [id number] Date of Birth: [birth year]

## 2017-08-30 NOTE — Therapy (Signed)
[place], [place], [address] Phone: [phone]   Fax:  [phone]  Physical Therapy Treatment  Patient Details  Name: Alex Cortez MRN: [id number] Date of Birth: 1950-09-27 Referring Provider: Lalla Brothers, MD   Encounter Date: 08/30/2017  PT End of Session - 08/30/17 1104    Visit Number  6    Number of Visits  9    Date for PT Re-Evaluation  09/08/17    Authorization Type  Medicare Part A and B, BCBS Federal Emp PPO (PT/OT/SLP = 75 visits per calender year combined), no auth required    Authorization Time Period  08/11/2017 - 09/08/2017    Authorization - Visit Number  16 total (all disciplines)    Authorization - Number of Visits  75 75 total visits allowed.  3 sessions for SLP; divided PT/OT     PT Start Time  1031    PT Stop Time  1110    PT Time Calculation (min)  39 min    Equipment Utilized During Treatment  Gait belt    Activity Tolerance  Patient tolerated treatment well    Behavior During Therapy  WFL for tasks assessed/performed       Past Medical History:  Diagnosis Date  . CAD (coronary artery disease)    nonobstructive CAD/patent LAD stent site; EF 65%, 08/2011; NSTEMI/DES LAD, 6/20 11  . Chest pain   . Diabetes mellitus    insulin-dependent  . Dyslipidemia   . HTN (hypertension)   . Post MI syndrome ([place]) 08-29-2009  . Stroke [facility]) 10/22/2012   Left inferior pons    Past Surgical History:  Procedure Laterality Date  . CORONARY ANGIOPLASTY WITH STENT PLACEMENT      There were no vitals filed for this visit.  Subjective Assessment - 08/30/17 1125    Subjective  Patient just finished OT session and states he is worn out. He reports no difficulty performing his HEP but reports the exercises are still challenging.     Pertinent History  DM, HTN, CAD, multiple strokes (4)    Limitations  Walking;House hold activities;Lifting;Writing    Patient Stated Goals  return to walking more  normally again and get back to normal activities like bowling    Currently in Pain?  No/denies        [facility] Adult PT Treatment/Exercise - 08/30/17 1034      Knee/Hip Exercises: Standing   Heel Raises  Both;1 set;20 reps;Limitations    Heel Raises Limitations  Toe raises 20x incline slope    Lateral Step Up  Right;15 reps;Hand Hold: 1;Step Height: 6"    Forward Step Up  Right;15 reps;Hand Hold: 1;Step Height: 6"    Step Down  Right;2 sets;Step Height: 4";Hand Hold: 2;15 reps    Functional Squat  2 sets;10 reps;Limitations    Functional Squat Limitations  chair taps    Wall Squat  2 sets;10 reps;5 reps;Limitations;5 seconds 1x 10, 1x 5    Wall Squat Limitations  cues for mini-squat depth       Balance Exercises - 08/30/17 1128      Balance Exercises: Standing   Tandem Stance  Eyes open;Foam/compliant surface;3 reps;20 secs 3 reps Bil LE (alt foot align, 6 reps total)    SLS  Eyes open;Solid surface;Other reps (comment) 10x (3) cone taps for Bil LE, in // bars    Tandem Gait  Forward;3 reps;Limitations 15'    Retro Gait  3 reps;Limitations 15'  Sidestepping  3 reps;Theraband;Limitations Red TB around knees, 3x RT 15'        PT Education - 08/30/17 1131    Education provided  Yes    Education Details  Educated on exercises throughout session with cues for proper form.    Person(s) Educated  Patient    Methods  Explanation;Tactile cues;Verbal cues    Comprehension  Verbalized understanding;Returned demonstration       PT Short Term Goals - 08/23/17 1526      PT SHORT TERM GOAL #1   Title  Patient will be independent with HEP to progress functional LE strength and balance, updated PRN    Time  2    Period  Weeks    Status  On-going        PT Long Term Goals - 08/23/17 1525      PT LONG TERM GOAL #1   Title  Patient will improve MMT for all limited groups to 5/5 to demonstrate improved functional strength and muscle activation.    Time  4    Period  Weeks     Status  On-going      PT LONG TERM GOAL #2   Title  Patient will improve TUG to be = or < 12 seconds to demonstrate increased safety with gait activities and reduced fall risk.    Time  4    Period  Weeks    Status  On-going      PT LONG TERM GOAL #3   Title  Patient will improve DGI to 21/24 or greater to demosntrate significant improvement in dynamic/functional balance during gait and indicate reduced fall risk.    Time  4    Period  Weeks    Status  On-going      PT LONG TERM GOAL #4   Title  Patient will perform 5 time sit to stand in 14 seconds or less to demonstrate significant change in functional strength to improve mobility.    Time  4    Period  Weeks    Status  On-going      PT LONG TERM GOAL #5   Title  Patient will ambulate at 1.2 m/s during 2 MWT to demonstrate improved gait velocity, increaed community access, increased independenc, and reduced risk of falling.    Time  4    Period  Weeks    Status  On-going         Plan - 08/30/17 1132    Clinical Impression Statement  Therapy continued with focus on functional LE strengthening and balance training this session. No Supine exercises performed this date as patient verbalized consistent performance at home. Continued with step ups this session and progressed to step down for eccentric Rt quad strengthening. Wall squats initiated this date for LE endurance training as patient demonstrated good form with functional squat. Balance training continued with tandem and retro gait and progressed SLS balance activities to cone taps. Patient denied pain throughout session and required only 1 rest break during wall squat activity at EOS indicating improved activity tolerance. He will continue to benefit from skilled PT interventions to address current impairments and progress towards PLOF.      Rehab Potential  Good    PT Frequency  2x / week    PT Duration  4 weeks    PT Treatment/Interventions  ADLs/Self Care Home  Management;Electrical Stimulation;Gait training;Stair training;Functional mobility training;Therapeutic activities;Therapeutic exercise;Balance training;Neuromuscular re-education;Patient/family education;Manual techniques;Passive range of motion    PT  Next Visit Plan  Continue functional LE strengthening with step ups/down and wall squat for endurance. Progress balance training with compliant surface and initiate hurdle step over on solid ground.     PT Home Exercise Plan  Eval: bridge, sit to stands, SLS at counter; 08/24/17: squats infront of chair and tandem stance by counter.     Consulted and Agree with Plan of Care  Patient;Family member/caregiver    Family Member Consulted  wife, Adonis Huguenin       Patient will benefit from skilled therapeutic intervention in order to improve the following deficits and impairments:  Abnormal gait, Decreased activity tolerance, Decreased balance, Decreased mobility, Decreased strength, Improper body mechanics, Obesity, Postural dysfunction, Difficulty walking, Decreased endurance  Visit Diagnosis: Hemiplegia and hemiparesis following cerebral infarction affecting right dominant side (HCC)  Other abnormalities of gait and mobility     Problem List Patient Active Problem List   Diagnosis Date Noted  . Cerebral embolism with cerebral infarction 08/01/2017  . CVA (cerebral vascular accident) ([place]) 08/01/2017  . History of CVA with residual deficit   . Coronary artery disease involving native coronary artery of native heart without angina pectoris   . Essential hypertension   . Diabetes mellitus type 2 in obese ([place])   . Class 1 obesity due to excess calories with serious comorbidity and body mass index (BMI) of 32.0 to 32.9 in adult 09/09/2015  . Sedentary lifestyle 09/09/2015  . Hyperlipidemia 02/24/2015  . Essential hypertension, benign 02/24/2015  . Exertional dyspnea 10/18/2012  . Diastolic heart failure ([place]) 10/01/2011  . Obstructive sleep apnea  09/02/2011  . LV dysfunction 09/02/2011  . ERECTILE DYSFUNCTION, SECONDARY TO MEDICATION 04/14/2010  . Type 2 diabetes mellitus with vascular disease ([place]) 10/02/2009  . ACUT MI SUBENDOCARDIAL INFARCT INIT EPIS CARE 10/02/2009  . CORONARY ATHEROSCLEROSIS NATIVE CORONARY ARTERY 10/02/2009  . CHEST PAIN 10/02/2009    Kipp Brood, PT, DPT Physical Therapist with [facility]  08/30/2017 11:38 AM    [place], [place], [address] Phone: [phone]   Fax:  [phone]  Name: EADEN HETTINGER MRN: [id number] Date of Birth: [birth year]

## 2017-08-31 ENCOUNTER — Ambulatory Visit (INDEPENDENT_AMBULATORY_CARE_PROVIDER_SITE_OTHER): Payer: Medicare Other | Admitting: Adult Health

## 2017-08-31 ENCOUNTER — Encounter: Payer: Self-pay | Admitting: Adult Health

## 2017-08-31 VITALS — BP 171/92 | Ht 74.0 in | Wt 264.0 lb

## 2017-08-31 DIAGNOSIS — E119 Type 2 diabetes mellitus without complications: Secondary | ICD-10-CM

## 2017-08-31 DIAGNOSIS — I10 Essential (primary) hypertension: Secondary | ICD-10-CM

## 2017-08-31 DIAGNOSIS — E7800 Pure hypercholesterolemia, unspecified: Secondary | ICD-10-CM | POA: Diagnosis not present

## 2017-08-31 DIAGNOSIS — I63512 Cerebral infarction due to unspecified occlusion or stenosis of left middle cerebral artery: Secondary | ICD-10-CM | POA: Diagnosis not present

## 2017-08-31 DIAGNOSIS — E669 Obesity, unspecified: Secondary | ICD-10-CM

## 2017-08-31 DIAGNOSIS — E1169 Type 2 diabetes mellitus with other specified complication: Secondary | ICD-10-CM

## 2017-08-31 NOTE — Progress Notes (Signed)
I agree with the above plan 

## 2017-08-31 NOTE — Patient Instructions (Addendum)
Continue aspirin 325 mg daily and clopidogrel 75 mg daily  and lipitor  for secondary stroke prevention  Stop aspirin after 10/31/17 as you have completed 3 months of dual antiplatelet therapy at that time  Continue to follow up with PCP regarding cholesterol, diabetes and blood prssure management   Continue PT/OT - OT will be the one to release you to return to driving and working   You will be called to schedule 30 day cardiac monitor   Continue to monitor blood pressure at home  Maintain strict control of hypertension with blood pressure goal below 130/90, diabetes with hemoglobin A1c goal below 6.5% and cholesterol with LDL cholesterol (bad cholesterol) goal below 70 mg/dL. I also advised the patient to eat a healthy diet with plenty of whole grains, cereals, fruits and vegetables, exercise regularly and maintain ideal body weight.  Followup in the future with me in 3 months or call earlier if needed         Thank you for coming to see [place] at [facility]. I hope we have been able to provide you high quality care today.  You may receive a patient satisfaction survey over the next few weeks. We would appreciate your feedback and comments so that we may continue to improve ourselves and the health of our patients.

## 2017-08-31 NOTE — Progress Notes (Signed)
[facility] [address] [place]. [place] [address] [phone]       OFFICE FOLLOW UP NOTE  [name] Date of Birth:  May 10, 1950 Medical Record Number:  [id number]   Reason for Referral:  hospital stroke follow up  CHIEF COMPLAINT:  Chief Complaint  Patient presents with  . Follow-up    Hospital Stroke follow up pt saw Drr.Xu in hospital pt in room 9 with wife Adonis Huguenin    HPI: Alex Cortez is being seen today for initial visit in the office for left watershed infarcts with new left putamen into corona radiata infarct on 07/31/17. History obtained from patient and chart review. Reviewed all radiology images and labs personally.  Alex Cortez is a 67 y.o. male with history of CAD, HTN, HLD, DM, L pontine stroke who presented with R arm weakness.  CT head reviewed and showed no acute pathology.  CTA head and neck showed no emergent large vessel occlusion but did show moderate left M1 stenosis, bilateral cavernous ICA 60% stenosis, left frontal meningioma, left nasal/nasopharynx polyp and patching bilateral sinusitis.  MRI reviewed and showed scattered cortical and subcortical left watershed infarcts, left frontal meningioma, multiple small old infarct and chronic sinusitis.  2D echo showed an EF of 55 to 60% without source of embolus.  Lower extremity Doppler negative.  Repeat MRI on 08/02/2017 showed new lateral carcinoma extending into CR infarct.  LDL 87 and recommended to continue Lipitor 80 mg.  A1c uncontrolled at 9.9 and recommended close PCP follow-up.  Recommended DAPT for 3 months and then Plavix alone.  During hospitalization, all infarcts were felt to be thrombotic secondary to left M1 large vessel disease stenosis but unable to rule out atrial fibrillation and no A. fib was documented on time monitor during hospitalization ; recommended 30-day cardiac monitor as outpatient to rule out A. fib.  PT/OT recommended outpatient PT/OT/ST.  Patient was  discharged in stable condition.  Patient is being seen today for hospital follow-up and is accompanied by his wife.  He continues to have very mild right hemiparesis this has been improving as he has been going to PT/OT in [place].  He did undergo ST for approximately 2 weeks and then was told he no longer needs speech therapy.  Blood pressure elevated at today's appointment 171/92 but this is monitored at home and typically SBP 140s.    He continues to take aspirin and Plavix without side effects of bleeding or bruising.  Continues to take Lipitor without side effects myalgias. He has returned to most previous activities but he has not returned to work or driving at this time.  Patient is a driver for a car company.  Denies new or worsening stroke/TIA symptoms.  ROS:   14 system review of systems performed and negative with exception of numbness  PMH:  Past Medical History:  Diagnosis Date  . CAD (coronary artery disease)    nonobstructive CAD/patent LAD stent site; EF 65%, 08/2011; NSTEMI/DES LAD, 6/20 11  . Chest pain   . Diabetes mellitus    insulin-dependent  . Dyslipidemia   . HTN (hypertension)   . Post MI syndrome ([place]) 08-29-2009  . Stroke [facility]) 10/22/2012   Left inferior pons    PSH:  Past Surgical History:  Procedure Laterality Date  . CORONARY ANGIOPLASTY WITH STENT PLACEMENT      Social History:  Social History   Socioeconomic History  . Marital status: Married    Spouse name: Not  on file  . Number of children: Not on file  . Years of education: Not on file  . Highest education level: Not on file  Occupational History  . Not on file  Social Needs  . Financial resource strain: Not on file  . Food insecurity:    Worry: Not on file    Inability: Not on file  . Transportation needs:    Medical: Not on file    Non-medical: Not on file  Tobacco Use  . Smoking status: Former Smoker    Packs/day: 0.50    Years: 23.00    Pack years: 11.50    Types: Cigarettes      Start date: 03/16/1955    Last attempt to quit: 03/15/1988    Years since quitting: 29.4  . Smokeless tobacco: Never Used  Substance and Sexual Activity  . Alcohol use: No    Alcohol/week: 0.0 oz  . Drug use: No  . Sexual activity: Not on file  Lifestyle  . Physical activity:    Days per week: Not on file    Minutes per session: Not on file  . Stress: Not on file  Relationships  . Social connections:    Talks on phone: Patient refused    Gets together: Patient refused    Attends religious service: Patient refused    Active member of club or organization: Patient refused    Attends meetings of clubs or organizations: Patient refused    Relationship status: Patient refused  . Intimate partner violence:    Fear of current or ex partner: Patient refused    Emotionally abused: Patient refused    Physically abused: Patient refused    Forced sexual activity: Patient refused  Other Topics Concern  . Not on file  Social History Narrative   Works at [employer]. Married.     Family History:  Family History  Problem Relation Age of Onset  . Hypertension Other        in all family members    Medications:   Current Outpatient Medications on File Prior to Visit  Medication Sig Dispense Refill  . APIDRA SOLOSTAR 100 UNIT/ML Solostar Pen INJECT 30-36 UNITS         SUBCUTANEOUSLY 3 TIMES A   DAY WITH MEALS 75 mL 0  . aspirin 325 MG tablet Take 325 mg by mouth daily.    Marland Kitchen atorvastatin (LIPITOR) 80 MG tablet Take 1 tablet (80 mg total) by mouth every evening. 90 tablet 3  . benazepril (LOTENSIN) 40 MG tablet Take 0.5 tablets (20 mg total) by mouth daily. 30 tablet 0  . clopidogrel (PLAVIX) 75 MG tablet Take 1 tablet (75 mg total) by mouth daily. 90 tablet 0  . Continuous Blood Gluc Sensor (FREESTYLE LIBRE 14 DAY SENSOR) MISC 1 each by Does not apply route every 14 (fourteen) days. 8 each 2  . Insulin Detemir (LEVEMIR FLEXTOUCH) 100 UNIT/ML Pen Inject 100 Units into the skin at bedtime.  75 mL 0  . metFORMIN (GLUCOPHAGE) 1000 MG tablet Take 500 mg by mouth 2 (two) times daily with a meal.  3  . nitroGLYCERIN (NITROSTAT) 0.4 MG SL tablet Place 1 tablet (0.4 mg total) under the tongue every 5 (five) minutes as needed. 25 tablet 3  . ONE TOUCH ULTRA TEST test strip USE TO TEST BLOOD SUGAR FOUR TIMES DAILY E11.65 150 each 5   No current facility-administered medications on file prior to visit.     Allergies:   Allergies  Allergen Reactions  . Shrimp [Shellfish Allergy]     Swelling and hives      Physical Exam  Vitals:   08/31/17 0935  BP: (!) 171/92  Weight: 264 lb (119.7 kg)  Height: 6\' 2"  (1.88 m)   Body mass index is 33.9 kg/m. No exam data present  General: well developed, well nourished, elderly African-American male, seated, in no evident distress Head: head normocephalic and atraumatic.   Neck: supple with no carotid or supraclavicular bruits Cardiovascular: regular rate and rhythm, no murmurs Musculoskeletal: no deformity Skin:  no rash/petichiae Vascular:  Normal pulses all extremities  Neurologic Exam Mental Status: Awake and fully alert. Oriented to place and time. Recent and remote memory intact. Attention span, concentration and fund of knowledge appropriate. Mood and affect appropriate.  Cranial Nerves: Fundoscopic exam reveals sharp disc margins. Pupils equal, briskly reactive to light. Extraocular movements full without nystagmus. Visual fields full to confrontation. Hearing intact. Facial sensation intact. Face, tongue, palate moves normally and symmetrically.  Motor: Normal bulk and tone.  4+/5 right upper and right lower extremities Sensory.: intact to touch , pinprick , position and vibratory sensation.  Coordination: Rapid alternating movements normal in all extremities. Finger-to-nose and heel-to-shin performed accurately bilaterally.  Decreased right finger dexterity; orbits left normal for right arm Gait and Station: Arises from chair  without difficulty. Stance is normal. Gait demonstrates normal stride length and balance . Able to heel, toe and tandem walk without difficulty.  Reflexes: 1+ and symmetric. Toes downgoing.    NIHSS  0 Modified Rankin  2    Diagnostic Data (Labs, Imaging, Testing)  CT HEAD WO CONTRAST 07/31/2017 IMPRESSION: 1. No acute intracranial pathology seen on CT. 2. Mild cortical volume loss and scattered small vessel ischemic microangiopathy. 3. Opacification of the right side of the sphenoid sinus and partial opacification of the right frontal sinus.  CT ANGIO HEAD W OR WO CONTRAST CT ANGIO NECK W OR WO CONTRAST 07/31/2017 IMPRESSION: 1. Negative for emergent large vessel occlusion. 2. Moderate to advanced left M1 segment stenosis due to eccentric filling defect, possibly symptomatic in this setting. 3. ~60% bilateral cavernous ICA narrowing due to calcified plaque. 4. No flow limiting stenosis in the neck. 5. Presumed meningioma along the left frontal convexity measuring up to 4.3 x 1.3 cm. 6. Left nasal cavity polyp herniating into the nasopharynx. Patchy bilateral sinusitis.  MR BRAIN WO CONTRAST 07/31/2017 IMPRESSION: 1. Scattered, primarily cortical acute infarction along the left cerebral convexity, posterior watershed pattern. 2. Presumed meningioma along the lateral left frontal convexity measuring 4.1 x 1.4 cm. No neighboring brain edema. 3. Multiple small remote cerebellar infarcts. 4. Chronic sinusitis.  Left nasal cavity and nasopharynx polyp  MR BRAIN LIMITED WO CONTRAST 08/02/2017 IMPRESSION: 1. New acute infarction within the left lateral putamen extending into corona radiata. 2. Multiple small late acute/early subacute infarctions in the left watershed distribution are stable to minimally increased in size in comparison with the prior MRI of the brain. 3. Interval petechial hemorrhage within the larger focus infarction in the left parietal lobe. 4. Stable  left frontal convexity extra-axial mass, presumably meningioma.   ASSESSMENT: Alex Cortez is a 67 y.o. year old male here with left watershed infarcts with additional new left putamen into corona radiata infarct on 07/31/2017 secondary to most likely thrombotic secondary to left M1 large vessel disease stenosis. Vascular risk factors include CAD, HTN, HLD, DM and px stroke.     PLAN: -Continue aspirin 325 mg daily and  clopidogrel 75 mg daily  and Lipitor for secondary stroke prevention -d/c aspirin after 10/31/2017 as 3 months of DAPT completed -30 day cardiac monitor to r/o atrial fibrillation -Continue PT/OT -patient is cleared to drive from physical standpoint but would like opinion from occupational therapy to test cognitive state prior to driving -patient verbalizes understanding of needing clearance from OT prior to beginning to drive -F/u with PCP regarding your HLD, HTN DM management -continue to monitor BP at home -Maintain strict control of hypertension with blood pressure goal below 130/90, diabetes with hemoglobin A1c goal below 6.5% and cholesterol with LDL cholesterol (bad cholesterol) goal below 70 mg/dL. I also advised the patient to eat a healthy diet with plenty of whole grains, cereals, fruits and vegetables, exercise regularly and maintain ideal body weight.  Follow up in 3 months or call earlier if needed   Greater than 50% of time during this 25 minute visit was spent on counseling,explanation of diagnosis of multiple infarcts, reviewing risk factor management of HLD, HTN and DM, planning of further management, discussion with patient and family and coordination of care   Venancio Poisson, [facility]  [facility] [address] [place], [place] [address]  Phone [phone] Fax [phone]

## 2017-09-01 ENCOUNTER — Encounter (HOSPITAL_COMMUNITY): Payer: Self-pay

## 2017-09-01 ENCOUNTER — Other Ambulatory Visit: Payer: Self-pay

## 2017-09-01 ENCOUNTER — Ambulatory Visit (HOSPITAL_COMMUNITY): Payer: Medicare Other | Admitting: Occupational Therapy

## 2017-09-01 ENCOUNTER — Encounter (HOSPITAL_COMMUNITY): Payer: Self-pay | Admitting: Occupational Therapy

## 2017-09-01 ENCOUNTER — Ambulatory Visit (HOSPITAL_COMMUNITY): Payer: Medicare Other

## 2017-09-01 DIAGNOSIS — R278 Other lack of coordination: Secondary | ICD-10-CM

## 2017-09-01 DIAGNOSIS — R41841 Cognitive communication deficit: Secondary | ICD-10-CM | POA: Diagnosis not present

## 2017-09-01 DIAGNOSIS — R29898 Other symptoms and signs involving the musculoskeletal system: Secondary | ICD-10-CM

## 2017-09-01 DIAGNOSIS — I69351 Hemiplegia and hemiparesis following cerebral infarction affecting right dominant side: Secondary | ICD-10-CM | POA: Diagnosis not present

## 2017-09-01 DIAGNOSIS — R2689 Other abnormalities of gait and mobility: Secondary | ICD-10-CM | POA: Diagnosis not present

## 2017-09-01 NOTE — Therapy (Signed)
[place], [place], [address] Phone: [phone]   Fax:  [phone]  Physical Therapy Treatment  Patient Details  Name: [name] MRN: [id number] Date of Birth: Jun 07, 1950 Referring Provider: Lalla Brothers, MD   Encounter Date: 09/01/2017  PT End of Session - 09/01/17 1010    Visit Number  7    Number of Visits  9    Date for PT Re-Evaluation  09/08/17    Authorization Type  Medicare Part A and B, BCBS Federal Emp PPO (PT/OT/SLP = 75 visits per calender year combined), no auth required    Authorization Time Period  08/11/2017 - 09/08/2017    Authorization - Visit Number  18 total (all disciplines)    Authorization - Number of Visits  75 75 total visits allowed.  3 sessions for SLP; divided PT/OT     PT Start Time  [phone]    PT Stop Time  1026    PT Time Calculation (min)  39 min    Equipment Utilized During Treatment  Gait belt    Activity Tolerance  Patient tolerated treatment well    Behavior During Therapy  WFL for tasks assessed/performed       Past Medical History:  Diagnosis Date  . CAD (coronary artery disease)    nonobstructive CAD/patent LAD stent site; EF 65%, 08/2011; NSTEMI/DES LAD, 6/20 11  . Chest pain   . Diabetes mellitus    insulin-dependent  . Dyslipidemia   . HTN (hypertension)   . Post MI syndrome ([place]) 08-29-2009  . Stroke [facility]) 10/22/2012   Left inferior pons    Past Surgical History:  Procedure Laterality Date  . CORONARY ANGIOPLASTY WITH STENT PLACEMENT      There were no vitals filed for this visit.  Subjective Assessment - 09/01/17 1008    Subjective  Patient states he is feeling well today. He had his follow up with his neurologist yesterday and states it went well. He does not need to go back until September.    Pertinent History  DM, HTN, CAD, multiple strokes (4)    Limitations  Walking;House hold activities;Lifting;Writing    Patient Stated Goals  return to  walking more normally again and get back to normal activities like bowling    Currently in Pain?  No/denies       [facility] Adult PT Treatment/Exercise - 09/01/17 0001      Knee/Hip Exercises: Standing   Heel Raises  Both;1 set;20 reps;Limitations    Heel Raises Limitations  Toe raises 20x incline slope    Lateral Step Up  Right;15 reps;Hand Hold: 1;Step Height: 6";1 set    Forward Step Up  Right;15 reps;Hand Hold: 1;Step Height: 6";1 set    Step Down  Right;2 sets;Step Height: 4";15 reps;Hand Hold: 1    Wall Squat  1 set;15 reps;3 seconds    Wall Squat Limitations  cues for mini-squat depth       Balance Exercises - 09/01/17 0956      Balance Exercises: Standing   Tandem Stance  Eyes open;Foam/compliant surface;Limitations;4 reps alt. foot alignment, 10x UE flexion with dowel    SLS  Eyes open;Solid surface;Other reps (comment);Limitations 10x (3) cone taps wtih no UE support, min assist    Tandem Gait  Forward;Limitations;4 reps;Foam/compliant surface 15'    Sidestepping  Theraband;Limitations;2 reps Green TB, RT 15'    Step Over Hurdles / Cones  6 reps forward ove 4 (6") hurdles, 3x leading Rt  LE, 3x leading Lt LE        PT Education - 09/01/17 1142    Education provided  Yes    Education Details  Educated on rest breaks throughout and importance of self grading fatigue level.     Person(s) Educated  Patient    Methods  Explanation    Comprehension  Verbalized understanding       PT Short Term Goals - 08/23/17 1526      PT SHORT TERM GOAL #1   Title  Patient will be independent with HEP to progress functional LE strength and balance, updated PRN    Time  2    Period  Weeks    Status  On-going        PT Long Term Goals - 08/23/17 1525      PT LONG TERM GOAL #1   Title  Patient will improve MMT for all limited groups to 5/5 to demonstrate improved functional strength and muscle activation.    Time  4    Period  Weeks    Status  On-going      PT LONG TERM GOAL #2    Title  Patient will improve TUG to be = or < 12 seconds to demonstrate increased safety with gait activities and reduced fall risk.    Time  4    Period  Weeks    Status  On-going      PT LONG TERM GOAL #3   Title  Patient will improve DGI to 21/24 or greater to demosntrate significant improvement in dynamic/functional balance during gait and indicate reduced fall risk.    Time  4    Period  Weeks    Status  On-going      PT LONG TERM GOAL #4   Title  Patient will perform 5 time sit to stand in 14 seconds or less to demonstrate significant change in functional strength to improve mobility.    Time  4    Period  Weeks    Status  On-going      PT LONG TERM GOAL #5   Title  Patient will ambulate at 1.2 m/s during 2 MWT to demonstrate improved gait velocity, increaed community access, increased independenc, and reduced risk of falling.    Time  4    Period  Weeks    Status  On-going        Plan - 09/01/17 1029    Clinical Impression Statement  This session continued with functional LE strengthening and advanced balance training. Continued with step-ups and downs for Rt quad strengthening and wall squats for endurance. Progressed tandem gait to foam surface and patient required min assist to maintain balance and cues for safe stepping strategies initially, improved control with repetition. He advanced tandem stance with bil UE flexion for dual task challenge. Patient denied pain throughout session but required several rest breaks throughout session. He will continue to benefit from skilled PT interventions to address current impairments and progress towards PLOF.    Rehab Potential  Good    PT Frequency  2x / week    PT Duration  4 weeks    PT Treatment/Interventions  ADLs/Self Care Home Management;Electrical Stimulation;Gait training;Stair training;Functional mobility training;Therapeutic activities;Therapeutic exercise;Balance training;Neuromuscular re-education;Patient/family  education;Manual techniques;Passive range of motion    PT Next Visit Plan  Re-assess next session and consider readiness for discharge. Update HEP. Continue functional LE strengthening with step ups/down and wall squat for endurance. Progress balance training with compliant  surface and initiate hurdle step over on solid ground.    PT Home Exercise Plan  Eval: bridge, sit to stands, SLS at counter; 08/24/17: squats infront of chair and tandem stance by counter.     Consulted and Agree with Plan of Care  Patient;Family member/caregiver    Family Member Consulted  wife, Adonis Huguenin       Patient will benefit from skilled therapeutic intervention in order to improve the following deficits and impairments:  Abnormal gait, Decreased activity tolerance, Decreased balance, Decreased mobility, Decreased strength, Improper body mechanics, Obesity, Postural dysfunction, Difficulty walking, Decreased endurance  Visit Diagnosis: Hemiplegia and hemiparesis following cerebral infarction affecting right dominant side (HCC)  Other abnormalities of gait and mobility     Problem List Patient Active Problem List   Diagnosis Date Noted  . Cerebral embolism with cerebral infarction 08/01/2017  . CVA (cerebral vascular accident) ([place]) 08/01/2017  . History of CVA with residual deficit   . Coronary artery disease involving native coronary artery of native heart without angina pectoris   . Essential hypertension   . Diabetes mellitus type 2 in obese ([facility])   . Class 1 obesity due to excess calories with serious comorbidity and body mass index (BMI) of 32.0 to 32.9 in adult 09/09/2015  . Sedentary lifestyle 09/09/2015  . Hyperlipidemia 02/24/2015  . Essential hypertension, benign 02/24/2015  . Exertional dyspnea 10/18/2012  . Diastolic heart failure ([place]) 10/01/2011  . Obstructive sleep apnea 09/02/2011  . LV dysfunction 09/02/2011  . ERECTILE DYSFUNCTION, SECONDARY TO MEDICATION 04/14/2010  . Type 2 diabetes  mellitus with vascular disease ([place]) 10/02/2009  . ACUT MI SUBENDOCARDIAL INFARCT INIT EPIS CARE 10/02/2009  . CORONARY ATHEROSCLEROSIS NATIVE CORONARY ARTERY 10/02/2009  . CHEST PAIN 10/02/2009    Kipp Brood, PT, DPT Physical Therapist with [facility]  09/01/2017 11:45 AM    [place], [place], [address] Phone: [phone]   Fax:  [phone]  Name: EULISES KIJOWSKI MRN: [id number] Date of Birth: 03/23/1950

## 2017-09-01 NOTE — Therapy (Addendum)
[place], [place], [address] Phone: [phone]   Fax:  [phone]  Occupational Therapy Treatment ([place])  Patient Details  Name: Alex Cortez MRN: [id number] Date of Birth: [birth year] Referring Provider: Lalla Brothers, MD    Progress Note Reporting Period 08/09/2017 to 09/01/2017  See note below for Objective Data and Assessment of Progress/Goals.      Encounter Date: 09/01/2017  OT End of Session - 09/01/17 1318    Visit Number  8    Number of Visits  16    Date for OT Re-Evaluation  10/08/17    Authorization Type  1) Medicare A & B; 2) BCBS    Authorization Time Period  BCBS: 75 visits/year; 08/18/17: Discussed with speech and PT, speech will use 3, OT will use 35, and PT will use 35    Authorization - Visit Number  8    Authorization - Number of Visits  35    OT Start Time  1031    OT Stop Time  1115    OT Time Calculation (min)  44 min    Activity Tolerance  Patient tolerated treatment well    Behavior During Therapy  WFL for tasks assessed/performed       Past Medical History:  Diagnosis Date  . CAD (coronary artery disease)    nonobstructive CAD/patent LAD stent site; EF 65%, 08/2011; NSTEMI/DES LAD, 6/20 11  . Chest pain   . Diabetes mellitus    insulin-dependent  . Dyslipidemia   . HTN (hypertension)   . Post MI syndrome ([place]) 08-29-2009  . Stroke [facility]) 10/22/2012   Left inferior pons    Past Surgical History:  Procedure Laterality Date  . CORONARY ANGIOPLASTY WITH STENT PLACEMENT      There were no vitals filed for this visit.  Subjective Assessment - 09/01/17 1028    Subjective   S: I've been driving a little bit and felt good about it.     Currently in Pain?  No/denies         [facility] OT Assessment - 09/01/17 1028      Assessment   Medical Diagnosis  s/p L CVA      Precautions   Precautions  None      Coordination   Right 9 Hole Peg Test  1'09" 5'10"  previous      AROM   Right Shoulder Flexion  153 Degrees 130 previous    Right Shoulder ABduction  158 Degrees 140 previous    Right Shoulder Internal Rotation  90 Degrees same as previous    Right Shoulder External Rotation  80 Degrees 40 previous    Right Wrist Extension  52 Degrees 15 previous    Right Wrist Flexion  40 Degrees 22 previous      Strength   Right Shoulder Flexion  4+/5 4/5 previous    Right Shoulder ABduction  4+/5 4-/5 previous    Right Shoulder Internal Rotation  4+/5 3/5 previous    Right Shoulder External Rotation  4/5 3/5 previous    Right Elbow Flexion  4+/5 3+/5 previous    Right Elbow Extension  4/5 3+/5 previous    Right Wrist Flexion  4-/5 2+/5 previous    Right Wrist Extension  3+/5 3-/5 previous    Right Wrist Radial Deviation  5/5 3/5 previous    Right Wrist Ulnar Deviation  5/5 3/5 previous    Right Hand Grip (lbs)  46 34 previous  Right Hand Lateral Pinch  22 lbs 15 previous    Right Hand 3 Point Pinch  14 lbs 10 previous               OT Treatments/Exercises (OP) - 09/01/17 1047      Exercises   Exercises  Wrist;Hand;Shoulder;Elbow      Shoulder Exercises: Standing   External Rotation  Theraband;10 reps    Theraband Level (Shoulder External Rotation)  Level 2 (Red)    Extension  Theraband;10 reps    Theraband Level (Shoulder Extension)  Level 2 (Red)    Row  Theraband;10 reps    Theraband Level (Shoulder Row)  Level 2 (Red)    Retraction  Theraband;10 reps    Theraband Level (Shoulder Retraction)  Level 2 (Red)      Fine Motor Coordination (Hand/Wrist)   Fine Motor Coordination  Small Pegboard    Small Pegboard  Attempted to use tweezers to place pegs, pt unable to complete. Pt grasped multiple pegs and held in palm, working on palm to fingertip translation and maintaining hold on remaining pegs while placing one peg.             OT Short Term Goals - 09/01/17 1051      OT SHORT TERM GOAL #1   Title  Pt will be  provided with and educated on HEP to promote improved use of RUE as dominant during B/IADLs.     Time  4    Period  Weeks    Status  On-going      OT SHORT TERM GOAL #2   Title  Pt will improve RUE strength to 4/5 to improve ability to perform dressing tasks using RUE as dominant.     Baseline  6/20: met with exception of wrist flexion 4-/5 and wrist extension 3+/5    Time  4    Period  Weeks    Status  Partially Met      OT SHORT TERM GOAL #3   Title  Pt will improve right grip strength by 10# and pinch strength by 3# to improve ability to hold eating utensils.     Time  4    Period  Weeks    Status  Achieved      OT SHORT TERM GOAL #4   Title  Pt will improve right coordination required for eating tasks by completing 9 hole peg test in 2.5 minutes or less.     Time  4    Period  Weeks    Status  Achieved        OT Long Term Goals - 09/01/17 1053      OT LONG TERM GOAL #1   Title  Pt will return to highest level of functioning and independence using RUE as dominant for B/ADL tasks.     Time  8    Period  Weeks    Status  On-going      OT LONG TERM GOAL #2   Title  Pt will improve RUE strength to 4+/5 or greater to improve ability to perform yardwork tasks using RUE as dominant.     Time  8    Period  Weeks    Status  On-going      OT LONG TERM GOAL #3   Title  Pt will improve right grip strength by 18# and pinch strength by 5# to improve ability to hold and carry objects with right hand.     Time  8    Period  Weeks      OT LONG TERM GOAL #4   Title  Pt will improve right fine motor coordination required for grooming tasks such as shaving by completing 9 hole peg test in under 1 minute.     Time  4    Period  Weeks    Status  On-going            Plan - 09/01/17 1102    Clinical Impression Statement  A: Mini-reassessment completed this session, pt is making great progress with OT goals, he has met 2/4 STGs and partially met an additional STG, is making  progress towards LTGs as well. Continued with coordination work today, attempted to use tweezers however pt unable to complete task. Increased time required for coordination tasks. Resumed scapular theraband and added er as well. Verbal cuing for form and technique during tasks.     Plan  P: Assess handwriting and trial various grips/writing utensils. continue with fine motor coordination, proximal shoulder strengthening       Patient will benefit from skilled therapeutic intervention in order to improve the following deficits and impairments:  Decreased activity tolerance, Decreased strength, Decreased range of motion, Decreased coordination, Impaired UE functional use, Increased fascial restrictions  Visit Diagnosis: Other lack of coordination  Other symptoms and signs involving the musculoskeletal system    Problem List Patient Active Problem List   Diagnosis Date Noted  . Cerebral embolism with cerebral infarction 08/01/2017  . CVA (cerebral vascular accident) ([place]) 08/01/2017  . History of CVA with residual deficit   . Coronary artery disease involving native coronary artery of native heart without angina pectoris   . Essential hypertension   . Diabetes mellitus type 2 in obese ([place])   . Class 1 obesity due to excess calories with serious comorbidity and body mass index (BMI) of 32.0 to 32.9 in adult 09/09/2015  . Sedentary lifestyle 09/09/2015  . Hyperlipidemia 02/24/2015  . Essential hypertension, benign 02/24/2015  . Exertional dyspnea 10/18/2012  . Diastolic heart failure ([place]) 10/01/2011  . Obstructive sleep apnea 09/02/2011  . LV dysfunction 09/02/2011  . ERECTILE DYSFUNCTION, SECONDARY TO MEDICATION 04/14/2010  . Type 2 diabetes mellitus with vascular disease ([place]) 10/02/2009  . ACUT MI SUBENDOCARDIAL INFARCT INIT EPIS CARE 10/02/2009  . CORONARY ATHEROSCLEROSIS NATIVE CORONARY ARTERY 10/02/2009  . CHEST PAIN 10/02/2009   Guadelupe Sabin, OTR/L   [phone] 09/01/2017, 1:20 PM  [place] [address] [place], [place], [address] Phone: [phone]   Fax:  [phone]  Name: Alex Cortez MRN: [id number] Date of Birth: 1950/07/16

## 2017-09-02 ENCOUNTER — Other Ambulatory Visit: Payer: Self-pay

## 2017-09-02 ENCOUNTER — Ambulatory Visit: Payer: Medicare Other | Admitting: Adult Health

## 2017-09-02 DIAGNOSIS — I252 Old myocardial infarction: Secondary | ICD-10-CM | POA: Diagnosis not present

## 2017-09-02 DIAGNOSIS — Z6833 Body mass index (BMI) 33.0-33.9, adult: Secondary | ICD-10-CM | POA: Diagnosis not present

## 2017-09-02 DIAGNOSIS — E782 Mixed hyperlipidemia: Secondary | ICD-10-CM | POA: Diagnosis not present

## 2017-09-02 DIAGNOSIS — E1165 Type 2 diabetes mellitus with hyperglycemia: Secondary | ICD-10-CM | POA: Diagnosis not present

## 2017-09-02 DIAGNOSIS — D329 Benign neoplasm of meninges, unspecified: Secondary | ICD-10-CM | POA: Diagnosis not present

## 2017-09-02 DIAGNOSIS — I69851 Hemiplegia and hemiparesis following other cerebrovascular disease affecting right dominant side: Secondary | ICD-10-CM | POA: Diagnosis not present

## 2017-09-02 DIAGNOSIS — I63419 Cerebral infarction due to embolism of unspecified middle cerebral artery: Secondary | ICD-10-CM | POA: Diagnosis not present

## 2017-09-02 DIAGNOSIS — I10 Essential (primary) hypertension: Secondary | ICD-10-CM | POA: Diagnosis not present

## 2017-09-02 MED ORDER — FREESTYLE LIBRE 14 DAY SENSOR MISC: 1.0000 | 2 refills | Status: DC

## 2017-09-06 ENCOUNTER — Ambulatory Visit (HOSPITAL_COMMUNITY): Payer: Medicare Other

## 2017-09-06 ENCOUNTER — Ambulatory Visit (HOSPITAL_COMMUNITY): Payer: Medicare Other | Admitting: Occupational Therapy

## 2017-09-06 ENCOUNTER — Encounter (HOSPITAL_COMMUNITY): Payer: Self-pay | Admitting: Occupational Therapy

## 2017-09-06 ENCOUNTER — Encounter (HOSPITAL_COMMUNITY): Payer: Self-pay

## 2017-09-06 ENCOUNTER — Other Ambulatory Visit: Payer: Self-pay

## 2017-09-06 DIAGNOSIS — R2689 Other abnormalities of gait and mobility: Secondary | ICD-10-CM

## 2017-09-06 DIAGNOSIS — R29898 Other symptoms and signs involving the musculoskeletal system: Secondary | ICD-10-CM

## 2017-09-06 DIAGNOSIS — I69351 Hemiplegia and hemiparesis following cerebral infarction affecting right dominant side: Secondary | ICD-10-CM

## 2017-09-06 DIAGNOSIS — R278 Other lack of coordination: Secondary | ICD-10-CM

## 2017-09-06 DIAGNOSIS — R41841 Cognitive communication deficit: Secondary | ICD-10-CM | POA: Diagnosis not present

## 2017-09-06 NOTE — Therapy (Signed)
[place], [place], [address] Phone: [phone]   Fax:  [phone]  Physical Therapy Treatment  Patient Details  Name: Alex Cortez MRN: [id number] Date of Birth: [birth year] Referring Provider: Lalla Brothers, MD   Encounter Date: 09/06/2017  PT End of Session - 09/06/17 1031    Visit Number  8    Number of Visits  9    Date for PT Re-Evaluation  09/08/17    Authorization Type  Medicare Part A and B, BCBS Federal Emp PPO (PT/OT/SLP = 75 visits per calender year combined), no auth required    Authorization Time Period  08/11/2017 - 09/08/2017    Authorization - Visit Number  20 total (all disciplines)    Authorization - Number of Visits  75 75 total visits allowed.  3 sessions for SLP; divided PT/OT     PT Start Time  1036    PT Stop Time  1117    PT Time Calculation (min)  41 min    Equipment Utilized During Treatment  Gait belt    Activity Tolerance  Patient tolerated treatment well    Behavior During Therapy  WFL for tasks assessed/performed       Past Medical History:  Diagnosis Date  . CAD (coronary artery disease)    nonobstructive CAD/patent LAD stent site; EF 65%, 08/2011; NSTEMI/DES LAD, 6/20 11  . Chest pain   . Diabetes mellitus    insulin-dependent  . Dyslipidemia   . HTN (hypertension)   . Post MI syndrome ([place]) 08-29-2009  . Stroke [facility]) 10/22/2012   Left inferior pons    Past Surgical History:  Procedure Laterality Date  . CORONARY ANGIOPLASTY WITH STENT PLACEMENT      There were no vitals filed for this visit.  Subjective Assessment - 09/06/17 1152    Subjective  Patient is feeling well today. He states he worked hard in [place] and is ready for PT.    Pertinent History  DM, HTN, CAD, multiple strokes (4)    Limitations  Walking;House hold activities;Lifting;Writing    Patient Stated Goals  return to walking more normally again and get back to normal activities like bowling    Currently in Pain?  No/denies        [facility] Adult PT Treatment/Exercise - 09/06/17 0001      Knee/Hip Exercises: Standing   Step Down  Step Height: 6";Hand Hold: 1;10 reps;1 set;Both    Wall Squat  2 sets;10 reps;3 seconds    Wall Squat Limitations  cues for mini-squat depth       Balance Exercises - 09/06/17 1135      Balance Exercises: Standing   Tandem Stance  Eyes open;Foam/compliant surface;Other reps (comment) 4x alt foot alignment; 10 reps fwd punch with 1lb dowel    SLS with Vectors  Solid surface;5 reps;Intermittent upper extremity assist bil LE, 10" holds 3 positions    Rockerboard  Anterior/posterior;Lateral;UE support 2x 1 minute each (4 minutes total)    Tandem Gait  Forward;Limitations;Foam/compliant surface;3 reps 15'    Retro Gait  3 reps;Limitations 15'    Sidestepping  Theraband;Limitations;3 reps green TB, 15' RT    Step Over Hurdles / Cones  6 reps forward over 4 (6") and 2 (12") hurdles, 3x leading Rt LE, 3x leading Lt LE; 3 reps RT for lateral stepping over 4 (6") hurdles to Rt/Lt.        PT Education - 09/06/17 1140    Education  provided  Yes    Education Details  Educated on exercises throughout and updated HEP.    Person(s) Educated  Patient    Methods  Explanation;Handout    Comprehension  Verbalized understanding;Returned demonstration       PT Short Term Goals - 08/23/17 1526      PT SHORT TERM GOAL #1   Title  Patient will be independent with HEP to progress functional LE strength and balance, updated PRN    Time  2    Period  Weeks    Status  On-going        PT Long Term Goals - 08/23/17 1525      PT LONG TERM GOAL #1   Title  Patient will improve MMT for all limited groups to 5/5 to demonstrate improved functional strength and muscle activation.    Time  4    Period  Weeks    Status  On-going      PT LONG TERM GOAL #2   Title  Patient will improve TUG to be = or < 12 seconds to demonstrate increased safety with gait activities and  reduced fall risk.    Time  4    Period  Weeks    Status  On-going      PT LONG TERM GOAL #3   Title  Patient will improve DGI to 21/24 or greater to demosntrate significant improvement in dynamic/functional balance during gait and indicate reduced fall risk.    Time  4    Period  Weeks    Status  On-going      PT LONG TERM GOAL #4   Title  Patient will perform 5 time sit to stand in 14 seconds or less to demonstrate significant change in functional strength to improve mobility.    Time  4    Period  Weeks    Status  On-going      PT LONG TERM GOAL #5   Title  Patient will ambulate at 1.2 m/s during 2 MWT to demonstrate improved gait velocity, increaed community access, increased independenc, and reduced risk of falling.    Time  4    Period  Weeks    Status  On-going        Plan - 09/06/17 1141    Clinical Impression Statement  Continued with functional LE strengthening and advanced balance training. Continued with step-downs and wall squats for endurance. Balance activities continued with dual task and patient demonstrated improved ankle strategies to maintain balance but continues to have low guard posture. HEP was updated for hip strengthening and balance at home, patient was instructed to perform this exercise by the kitchen counter for safety. He will continue to benefit from skilled PT interventions to address current impairments and progress towards PLOF.    Rehab Potential  Good    PT Frequency  2x / week    PT Duration  4 weeks    PT Treatment/Interventions  ADLs/Self Care Home Management;Electrical Stimulation;Gait training;Stair training;Functional mobility training;Therapeutic activities;Therapeutic exercise;Balance training;Neuromuscular re-education;Patient/family education;Manual techniques;Passive range of motion    PT Next Visit Plan  Re-assess next session and determine readiness for discharge.     PT Home Exercise Plan  Eval: bridge, sit to stands, SLS at counter;  08/24/17: squats infront of chair and tandem stance by counter; 09/06/17 - side step with with theraband at counter    Consulted and Agree with Plan of Care  Patient;Family member/caregiver    Family Member Consulted  wife,  Adonis Huguenin       Patient will benefit from skilled therapeutic intervention in order to improve the following deficits and impairments:  Abnormal gait, Decreased activity tolerance, Decreased balance, Decreased mobility, Decreased strength, Improper body mechanics, Obesity, Postural dysfunction, Difficulty walking, Decreased endurance  Visit Diagnosis: Hemiplegia and hemiparesis following cerebral infarction affecting right dominant side (HCC)  Other abnormalities of gait and mobility     Problem List Patient Active Problem List   Diagnosis Date Noted  . Cerebral embolism with cerebral infarction 08/01/2017  . CVA (cerebral vascular accident) ([place]) 08/01/2017  . History of CVA with residual deficit   . Coronary artery disease involving native coronary artery of native heart without angina pectoris   . Essential hypertension   . Diabetes mellitus type 2 in obese ([place])   . Class 1 obesity due to excess calories with serious comorbidity and body mass index (BMI) of 32.0 to 32.9 in adult 09/09/2015  . Sedentary lifestyle 09/09/2015  . Hyperlipidemia 02/24/2015  . Essential hypertension, benign 02/24/2015  . Exertional dyspnea 10/18/2012  . Diastolic heart failure ([place]) 10/01/2011  . Obstructive sleep apnea 09/02/2011  . LV dysfunction 09/02/2011  . ERECTILE DYSFUNCTION, SECONDARY TO MEDICATION 04/14/2010  . Type 2 diabetes mellitus with vascular disease ([place]) 10/02/2009  . ACUT MI SUBENDOCARDIAL INFARCT INIT EPIS CARE 10/02/2009  . CORONARY ATHEROSCLEROSIS NATIVE CORONARY ARTERY 10/02/2009  . CHEST PAIN 10/02/2009    Kipp Brood, PT, DPT Physical Therapist with [facility]  09/06/2017 11:53 AM    [place], [place], [address] Phone: [phone]   Fax:  [phone]  Name: Alex Cortez MRN: [id number] Date of Birth: 11-16-1950

## 2017-09-06 NOTE — Therapy (Signed)
[place], [place], [address] Phone: [phone]   Fax:  [phone]  Occupational Therapy Treatment  Patient Details  Name: Alex Cortez MRN: [id number] Date of Birth: 11-07-1950 Referring Provider: Lalla Brothers, MD   Encounter Date: 09/06/2017  OT End of Session - 09/06/17 1035    Visit Number  9    Number of Visits  16    Date for OT Re-Evaluation  10/08/17    Authorization Type  1) Medicare A & B; 2) BCBS    Authorization Time Period  BCBS: 75 visits/year; 08/18/17: Discussed with speech and PT, speech will use 3, OT will use 35, and PT will use 35    Authorization - Visit Number  9    Authorization - Number of Visits  35    OT Start Time  0946    OT Stop Time  1033    OT Time Calculation (min)  47 min    Activity Tolerance  Patient tolerated treatment well    Behavior During Therapy  [facility] for tasks assessed/performed       Past Medical History:  Diagnosis Date  . CAD (coronary artery disease)    nonobstructive CAD/patent LAD stent site; EF 65%, 08/2011; NSTEMI/DES LAD, 6/20 11  . Chest pain   . Diabetes mellitus    insulin-dependent  . Dyslipidemia   . HTN (hypertension)   . Post MI syndrome ([place]) 08-29-2009  . Stroke [facility]) 10/22/2012   Left inferior pons    Past Surgical History:  Procedure Laterality Date  . CORONARY ANGIOPLASTY WITH STENT PLACEMENT      There were no vitals filed for this visit.  Subjective Assessment - 09/06/17 0946    Subjective   S: I drove a little bit this weekend.     Currently in Pain?  No/denies         [facility] OT Assessment - 09/06/17 0946      Assessment   Medical Diagnosis  s/p L CVA      Precautions   Precautions  None               OT Treatments/Exercises (OP) - 09/06/17 0947      ADLs   Writing  Assessed handwriting, pt with tremors in right thumb (chronic) which impede writing, however his lack of strength in index finger limits his  ability to compensate for tremors and stabilize pen for writing.       Exercises   Exercises  Wrist;Hand;Shoulder;Elbow      Shoulder Exercises: Seated   Protraction  Strengthening;15 reps    Protraction Weight (lbs)  2    Horizontal ABduction  Strengthening;15 reps    Horizontal ABduction Weight (lbs)  2    Flexion  Strengthening;15 reps    Flexion Weight (lbs)  2    Abduction  Strengthening;15 reps    ABduction Weight (lbs)  2      Shoulder Exercises: ROM/Strengthening   Proximal Shoulder Strengthening, Seated  10X each, 2#      Elbow Exercises   Forearm Supination  Strengthening;15 reps    Bar Weights/Barbell (Forearm Supination)  2 lbs      Additional Elbow Exercises   Hand Gripper with Small Beads  all beads gripper at 35#      Additional Wrist Exercises   Sponges  Pt used red clothespin in 3 point pinch to grasp and place 25 high resistance sponges into bucket. Min/mod difficulty due to  tremors in right thumb      Hand Exercises   Other Hand Exercises  Timed perfection: Pt used right hand to grasp and place shapes into board on 2 trials. Trial 1) '3\' 46"'$  Trial 2) 4'54"               OT Short Term Goals - 09/01/17 1051      OT SHORT TERM GOAL #1   Title  Pt will be provided with and educated on HEP to promote improved use of RUE as dominant during B/IADLs.     Time  4    Period  Weeks    Status  On-going      OT SHORT TERM GOAL #2   Title  Pt will improve RUE strength to 4/5 to improve ability to perform dressing tasks using RUE as dominant.     Baseline  6/20: met with exception of wrist flexion 4-/5 and wrist extension 3+/5    Time  4    Period  Weeks    Status  Partially Met      OT SHORT TERM GOAL #3   Title  Pt will improve right grip strength by 10# and pinch strength by 3# to improve ability to hold eating utensils.     Time  4    Period  Weeks    Status  Achieved      OT SHORT TERM GOAL #4   Title  Pt will improve right coordination required  for eating tasks by completing 9 hole peg test in 2.5 minutes or less.     Time  4    Period  Weeks    Status  Achieved        OT Long Term Goals - 09/01/17 1053      OT LONG TERM GOAL #1   Title  Pt will return to highest level of functioning and independence using RUE as dominant for B/ADL tasks.     Time  8    Period  Weeks    Status  On-going      OT LONG TERM GOAL #2   Title  Pt will improve RUE strength to 4+/5 or greater to improve ability to perform yardwork tasks using RUE as dominant.     Time  8    Period  Weeks    Status  On-going      OT LONG TERM GOAL #3   Title  Pt will improve right grip strength by 18# and pinch strength by 5# to improve ability to hold and carry objects with right hand.     Time  8    Period  Weeks      OT LONG TERM GOAL #4   Title  Pt will improve right fine motor coordination required for grooming tasks such as shaving by completing 9 hole peg test in under 1 minute.     Time  4    Period  Weeks    Status  On-going            Plan - 09/06/17 1035    Clinical Impression Statement  A: Continued with shoulder strengthening with focus on keeping forearm in neutral versus pronation during exercises. Continued with coordination exercises adding timed perfection game, pt with increased difficulty due to tremors in right thumb during task. Pt was able to complete gripper with increased resistance with small beads, increased time required and occasional rest breaks. Assessed handwriting and pt has difficulty due to lack  of strength in index finger to stabilize pen. Verbal cuing for form and technique during tasks.     Plan  P: Add therapy ball strengthening, continue with fine motor coordination tasks, work on 3 point pinch       Patient will benefit from skilled therapeutic intervention in order to improve the following deficits and impairments:  Decreased activity tolerance, Decreased strength, Decreased range of motion, Decreased  coordination, Impaired UE functional use, Increased fascial restrictions  Visit Diagnosis: Other lack of coordination  Other symptoms and signs involving the musculoskeletal system    Problem List Patient Active Problem List   Diagnosis Date Noted  . Cerebral embolism with cerebral infarction 08/01/2017  . CVA (cerebral vascular accident) ([place]) 08/01/2017  . History of CVA with residual deficit   . Coronary artery disease involving native coronary artery of native heart without angina pectoris   . Essential hypertension   . Diabetes mellitus type 2 in obese ([place])   . Class 1 obesity due to excess calories with serious comorbidity and body mass index (BMI) of 32.0 to 32.9 in adult 09/09/2015  . Sedentary lifestyle 09/09/2015  . Hyperlipidemia 02/24/2015  . Essential hypertension, benign 02/24/2015  . Exertional dyspnea 10/18/2012  . Diastolic heart failure ([place]) 10/01/2011  . Obstructive sleep apnea 09/02/2011  . LV dysfunction 09/02/2011  . ERECTILE DYSFUNCTION, SECONDARY TO MEDICATION 04/14/2010  . Type 2 diabetes mellitus with vascular disease ([place]) 10/02/2009  . ACUT MI SUBENDOCARDIAL INFARCT INIT EPIS CARE 10/02/2009  . CORONARY ATHEROSCLEROSIS NATIVE CORONARY ARTERY 10/02/2009  . CHEST PAIN 10/02/2009   Guadelupe Sabin, OTR/L  [phone] 09/06/2017, 10:40 AM  [place], [place], [address] Phone: [phone]   Fax:  [phone]  Name: Alex Cortez MRN: [id number] Date of Birth: [birth year]

## 2017-09-08 ENCOUNTER — Ambulatory Visit (HOSPITAL_COMMUNITY): Payer: Medicare Other | Admitting: Occupational Therapy

## 2017-09-08 ENCOUNTER — Ambulatory Visit (HOSPITAL_COMMUNITY): Payer: Medicare Other

## 2017-09-08 DIAGNOSIS — R41841 Cognitive communication deficit: Secondary | ICD-10-CM | POA: Diagnosis not present

## 2017-09-08 DIAGNOSIS — I69351 Hemiplegia and hemiparesis following cerebral infarction affecting right dominant side: Secondary | ICD-10-CM

## 2017-09-08 DIAGNOSIS — R29898 Other symptoms and signs involving the musculoskeletal system: Secondary | ICD-10-CM | POA: Diagnosis not present

## 2017-09-08 DIAGNOSIS — R278 Other lack of coordination: Secondary | ICD-10-CM | POA: Diagnosis not present

## 2017-09-08 DIAGNOSIS — R2689 Other abnormalities of gait and mobility: Secondary | ICD-10-CM

## 2017-09-08 NOTE — Therapy (Signed)
[place], [place], [address] Phone: [phone]   Fax:  [phone]  Physical Therapy Treatment/Discharge Summary  Patient Details  Name: Alex Cortez MRN: [id number] Date of Birth: 1950-06-12 Referring Provider: Lalla Brothers, MD   Encounter Date: 09/08/2017   Progress Note Reporting Period 08/11/17 to 09/08/17  See note below for Objective Data and Assessment of Progress/Goals.   PHYSICAL THERAPY DISCHARGE SUMMARY  Visits from Start of Care: 9  Current functional level related to goals / functional outcomes: Re-assessment performed today and patient has met/partially met all short term and long term goals. He has demonstrated compliance with HEP and made significant improvements in functional strength and balance. He scored a 22/24 on DGI indicating he is not at risk for falls during ambulation and has a gait velocity of 1.14 m/s indicating he is in the normal/safe community ambulator category and not at risk for falls. His TUG and 5x sit to stand test both improve significantly further indicating improved functional LE strength and imrpoved balance. He has been educated on benefits of regular exercise to reduce the risk of additional strokes and provided handouts on local fitness centers. He has been educated on benefits of joining a walking club like "silver sneakers" and has expressed an interest in participating in these groups. He will be discharged following this session.   Remaining deficits: See belwo    Education / Equipment: Educated on progress towards goals and readiness for discharge from PT. He was educated on local fitness centers and benefits of regular exercise. Provided HEP over course of therapy.  Plan: Patient agrees to discharge.  Patient goals were met. Patient is being discharged due to meeting the stated rehab goals.  ?????       PT End of Session - 09/08/17 1003    Visit Number  9     Number of Visits  9    Date for PT Re-Evaluation  09/08/17    Authorization Type  Medicare Part A and B, BCBS [employer] PPO (PT/OT/SLP = 75 visits per calender year combined), no auth required    Authorization Time Period  08/11/2017 - 09/08/2017    Authorization - Visit Number  22 total (all disciplines)    Authorization - Number of Visits  75 75 total visits allowed.  3 sessions for SLP; divided PT/OT     PT Start Time  [phone]    PT Stop Time  1019    PT Time Calculation (min)  32 min    Equipment Utilized During Treatment  Gait belt    Activity Tolerance  Patient tolerated treatment well    Behavior During Therapy  WFL for tasks assessed/performed       Past Medical History:  Diagnosis Date  . CAD (coronary artery disease)    nonobstructive CAD/patent LAD stent site; EF 65%, 08/2011; NSTEMI/DES LAD, 6/20 11  . Chest pain   . Diabetes mellitus    insulin-dependent  . Dyslipidemia   . HTN (hypertension)   . Post MI syndrome ([place]) 08-29-2009  . Stroke [facility]) 10/22/2012   Left inferior pons    Past Surgical History:  Procedure Laterality Date  . CORONARY ANGIOPLASTY WITH STENT PLACEMENT      There were no vitals filed for this visit.  Subjective Assessment - 09/08/17 1151    Subjective  Patient is pleasant at start of therapy session and ready for re-assessment. He reports he did his new exercise for side  stepping at the counter without difficulty.    Pertinent History  DM, HTN, CAD, multiple strokes (4)    Limitations  Walking;House hold activities;Lifting;Writing    Patient Stated Goals  return to walking more normally again and get back to normal activities like bowling    Currently in Pain?  No/denies         [facility] PT Assessment - 09/08/17 0001      Assessment   Medical Diagnosis  s/p L CVA    Referring Provider  Lalla Brothers, MD    Onset Date/Surgical Date  07/30/17    Hand Dominance  Right    Prior Therapy  Acute care      Precautions   Precautions  None       Restrictions   Weight Bearing Restrictions  No      Balance Screen   Has the patient fallen in the past 6 months  No    Has the patient had a decrease in activity level because of a fear of falling?   No    Is the patient reluctant to leave their home because of a fear of falling?   No      Prior Function   Level of Independence  Independent      Observation/Other Assessments   Focus on Therapeutic Outcomes (FOTO)   26% limited was 43% on 08/09/17      Strength   Right Hip Flexion  5/5    Right Hip Extension  5/5 was 4/5    Right Hip ABduction  5/5    Left Hip Flexion  5/5    Left Hip Extension  5/5    Left Hip ABduction  5/5    Right Knee Flexion  5/5 4-/5    Right Knee Extension  5/5 4+/5    Left Knee Flexion  5/5    Left Knee Extension  5/5    Right Ankle Dorsiflexion  5/5    Left Ankle Dorsiflexion  5/5      Transfers   Five time sit to stand comments   11.9 seconds without UE 19.2 secodns no UE on 08/11/17      Ambulation/Gait   Ambulation/Gait  Yes    Ambulation/Gait Assistance  7: Independent    Ambulation Distance (Feet)  452 Feet was 382 on 08/11/17    Assistive device  None    Gait Pattern  Within Functional Limits    Ambulation Surface  Level;Indoor    Gait velocity  1.14 m/s was 0.94 m/s on 08/11/17    Stairs  Yes    Stairs Assistance  7: Independent    Stair Management Technique  No rails;Alternating pattern;Forwards    Number of Stairs  4    Height of Stairs  6      Dynamic Gait Index   Level Surface  Normal    Change in Gait Speed  Normal    Gait with Horizontal Head Turns  Normal    Gait with Vertical Head Turns  Mild Impairment    Gait and Pivot Turn  Normal    Step Over Obstacle  Normal    Step Around Obstacles  Normal    Steps  Normal    Total Score  23    DGI comment:  -- was 17/24 indicating fall risk      Timed Up and Go Test   TUG  Normal TUG    Normal TUG (seconds)  10.6 was 14.6 on 08/11/17  PT Education - 09/08/17 1143     Education provided  Yes    Education Details  Educated on progress towards goals and readiness for discharge from PT. He was educated on local fitness centers and benefits of regular exercise.    Person(s) Educated  Patient;Spouse    Methods  Explanation;Handout    Comprehension  Verbalized understanding       PT Short Term Goals - 09/08/17 1000      PT SHORT TERM GOAL #1   Title  Patient will be independent with HEP to progress functional LE strength and balance, updated PRN    Time  2    Period  Weeks    Status  Achieved        PT Long Term Goals - 09/08/17 1000      PT LONG TERM GOAL #1   Title  Patient will improve MMT for all limited groups to 5/5 to demonstrate improved functional strength and muscle activation.    Time  4    Period  Weeks    Status  Achieved      PT LONG TERM GOAL #2   Title  Patient will improve TUG to be = or < 12 seconds to demonstrate increased safety with gait activities and reduced fall risk.    Time  4    Period  Weeks    Status  Achieved      PT LONG TERM GOAL #3   Title  Patient will improve DGI to 21/24 or greater to demosntrate significant improvement in dynamic/functional balance during gait and indicate reduced fall risk.    Time  4    Period  Weeks    Status  Achieved      PT LONG TERM GOAL #4   Title  Patient will perform 5 time sit to stand in 14 seconds or less to demonstrate significant change in functional strength to improve mobility.    Time  4    Period  Weeks    Status  Achieved      PT LONG TERM GOAL #5   Title  Patient will ambulate at 1.2 m/s during 2 MWT to demonstrate improved gait velocity, increaed community access, increased independenc, and reduced risk of falling.    Baseline  09/08/17 - 1.14 m/s    Time  4    Period  Weeks    Status  Partially Met        Plan - 09/08/17 1143    Clinical Impression Statement  Re-assessment performed today and patient has met/partially met all short term and long term  goals. He has demonstrated compliance with HEP and made significant improvements in functional strength and balance. He scored a 22/24 on DGI indicating he is not at risk for falls during ambulation and has a gait velocity of 1.14 m/s indicating he is in the normal/safe community ambulator category and not at risk for falls. His TUG and 5x sit to stand test both improve significantly further indicating improved functional LE strength and imrpoved balance. He has been educated on benefits of regular exercise to reduce the risk of additional strokes and provided handouts on local fitness centers. He has been educated on benefits of joining a walking club like "silver sneakers" and has expressed an interest in participating in these groups. He will be discharged following this session.    Rehab Potential  Good    PT Frequency  2x / week    PT Duration  4  weeks    PT Treatment/Interventions  ADLs/Self Care Home Management;Electrical Stimulation;Gait training;Stair training;Functional mobility training;Therapeutic activities;Therapeutic exercise;Balance training;Neuromuscular re-education;Patient/family education;Manual techniques;Passive range of motion    PT Next Visit Plan  Discharge    PT Home Exercise Plan  Eval: bridge, sit to stands, SLS at counter; 08/24/17: squats infront of chair and tandem stance by counter; 09/06/17 - side step with with theraband at counter    Consulted and Agree with Plan of Care  Patient;Family member/caregiver    Family Member Consulted  wife, Adonis Huguenin       Patient will benefit from skilled therapeutic intervention in order to improve the following deficits and impairments:  Abnormal gait, Decreased activity tolerance, Decreased balance, Decreased mobility, Decreased strength, Improper body mechanics, Obesity, Postural dysfunction, Difficulty walking, Decreased endurance  Visit Diagnosis: Hemiplegia and hemiparesis following cerebral infarction affecting right dominant side  (HCC)  Other abnormalities of gait and mobility     Problem List Patient Active Problem List   Diagnosis Date Noted  . Cerebral embolism with cerebral infarction 08/01/2017  . CVA (cerebral vascular accident) ([place]) 08/01/2017  . History of CVA with residual deficit   . Coronary artery disease involving native coronary artery of native heart without angina pectoris   . Essential hypertension   . Diabetes mellitus type 2 in obese ([place])   . Class 1 obesity due to excess calories with serious comorbidity and body mass index (BMI) of 32.0 to 32.9 in adult 09/09/2015  . Sedentary lifestyle 09/09/2015  . Hyperlipidemia 02/24/2015  . Essential hypertension, benign 02/24/2015  . Exertional dyspnea 10/18/2012  . Diastolic heart failure ([place]) 10/01/2011  . Obstructive sleep apnea 09/02/2011  . LV dysfunction 09/02/2011  . ERECTILE DYSFUNCTION, SECONDARY TO MEDICATION 04/14/2010  . Type 2 diabetes mellitus with vascular disease ([place]) 10/02/2009  . ACUT MI SUBENDOCARDIAL INFARCT INIT EPIS CARE 10/02/2009  . CORONARY ATHEROSCLEROSIS NATIVE CORONARY ARTERY 10/02/2009  . CHEST PAIN 10/02/2009    Kipp Brood, PT, DPT Physical Therapist with [facility]  09/08/2017 11:53 AM    [place], [place], [address] Phone: [phone]   Fax:  [phone]  Name: Alex Cortez MRN: [id number] Date of Birth: October 08, 1950

## 2017-09-08 NOTE — Therapy (Signed)
[place], [place], [address] Phone: [phone]   Fax:  [phone]  Occupational Therapy Treatment  Patient Details  Name: Alex Cortez MRN: [id number] Date of Birth: 1950/07/27 Referring Provider: Lalla Brothers, MD   Encounter Date: 09/08/2017  OT End of Session - 09/08/17 1207    Visit Number  10    Number of Visits  16    Date for OT Re-Evaluation  10/08/17    Authorization Type  1) Medicare A & B; 2) BCBS    Authorization Time Period  BCBS: 75 visits/year; 08/18/17: Discussed with speech and PT, speech will use 3, OT will use 35, and PT will use 35    Authorization - Visit Number  10    Authorization - Number of Visits  35    OT Start Time  1031    OT Stop Time  1114    OT Time Calculation (min)  43 min    Activity Tolerance  Patient tolerated treatment well    Behavior During Therapy  WFL for tasks assessed/performed       Past Medical History:  Diagnosis Date  . CAD (coronary artery disease)    nonobstructive CAD/patent LAD stent site; EF 65%, 08/2011; NSTEMI/DES LAD, 6/20 11  . Chest pain   . Diabetes mellitus    insulin-dependent  . Dyslipidemia   . HTN (hypertension)   . Post MI syndrome ([place]) 08-29-2009  . Stroke [facility]) 10/22/2012   Left inferior pons    Past Surgical History:  Procedure Laterality Date  . CORONARY ANGIOPLASTY WITH STENT PLACEMENT      There were no vitals filed for this visit.  Subjective Assessment - 09/08/17 1030    Subjective   S: This thumb gives me a lot of trouble.     Currently in Pain?  No/denies                   OT Treatments/Exercises (OP) - 09/08/17 1032      Exercises   Exercises  Wrist;Hand;Shoulder;Elbow      Shoulder Exercises: Seated   Protraction  Strengthening;15 reps    Protraction Weight (lbs)  2    Horizontal ABduction  Strengthening;15 reps    Horizontal ABduction Weight (lbs)  2    Flexion  Strengthening;15 reps     Flexion Weight (lbs)  2    Abduction  Strengthening;15 reps    ABduction Weight (lbs)  2      Shoulder Exercises: Standing   External Rotation  Theraband;10 reps    Theraband Level (Shoulder External Rotation)  Level 2 (Red)    Extension  Theraband;10 reps    Theraband Level (Shoulder Extension)  Level 2 (Red)    Row  Theraband;10 reps    Theraband Level (Shoulder Row)  Level 2 (Red)    Retraction  Theraband;10 reps    Theraband Level (Shoulder Retraction)  Level 2 (Red)      Shoulder Exercises: Therapy Ball   Other Therapy Ball Exercises  green striped therapy ball: chest press, circles each direction, diagonals, 10X each      Shoulder Exercises: ROM/Strengthening   X to V Arms  15X, 2#    Proximal Shoulder Strengthening, Seated  10X each, 2#, no rest breaks      Elbow Exercises   Forearm Supination  Strengthening;15 reps    Bar Weights/Barbell (Forearm Supination)  2 lbs      Additional Elbow Exercises  Hand Gripper with Medium Beads  all beads gripper at 42#    Hand Gripper with Small Beads  all beads gripper at 35#      Fine Motor Coordination (Hand/Wrist)   Fine Motor Coordination  Dealing card with thumb;Manipulating coins    Dealing card with thumb  Pt broke deck and shuffled cards with min/mod difficulty. Pt then held cards in left hand and pulled cards from pile using right thumb and middle fingers.     Manipulating coins  Pt held 10 coins in palm and worked on palm to fingertip translation before dropping into slotted container one at a time. Mod difficulty due to thumb tremors/jerking initially, once calm able to complete with min difficulty.                OT Short Term Goals - 09/01/17 1051      OT SHORT TERM GOAL #1   Title  Pt will be provided with and educated on HEP to promote improved use of RUE as dominant during B/IADLs.     Time  4    Period  Weeks    Status  On-going      OT SHORT TERM GOAL #2   Title  Pt will improve RUE strength to 4/5 to  improve ability to perform dressing tasks using RUE as dominant.     Baseline  6/20: met with exception of wrist flexion 4-/5 and wrist extension 3+/5    Time  4    Period  Weeks    Status  Partially Met      OT SHORT TERM GOAL #3   Title  Pt will improve right grip strength by 10# and pinch strength by 3# to improve ability to hold eating utensils.     Time  4    Period  Weeks    Status  Achieved      OT SHORT TERM GOAL #4   Title  Pt will improve right coordination required for eating tasks by completing 9 hole peg test in 2.5 minutes or less.     Time  4    Period  Weeks    Status  Achieved        OT Long Term Goals - 09/01/17 1053      OT LONG TERM GOAL #1   Title  Pt will return to highest level of functioning and independence using RUE as dominant for B/ADL tasks.     Time  8    Period  Weeks    Status  On-going      OT LONG TERM GOAL #2   Title  Pt will improve RUE strength to 4+/5 or greater to improve ability to perform yardwork tasks using RUE as dominant.     Time  8    Period  Weeks    Status  On-going      OT LONG TERM GOAL #3   Title  Pt will improve right grip strength by 18# and pinch strength by 5# to improve ability to hold and carry objects with right hand.     Time  8    Period  Weeks      OT LONG TERM GOAL #4   Title  Pt will improve right fine motor coordination required for grooming tasks such as shaving by completing 9 hole peg test in under 1 minute.     Time  4    Period  Weeks    Status  On-going  Plan - 09/08/17 1208    Clinical Impression Statement  A: Continued with shoulder strengthening and stability adding therapy ball exercises for proximal shoulder strengthening. Also worked on keeping hand/forearm in neutral during shoulder strengthening. Added card manipulation and worked with coins for improved fine motor coordination today. Pt has difficulty with in-hand manipulation when attempting to use thumb to move coins,  improved with practice. Verbal cuing for form and technique during exercises.     Plan  P: continue with proximal shoulder strengthening adding ball on wall, work on 3 point pinch       Patient will benefit from skilled therapeutic intervention in order to improve the following deficits and impairments:  Decreased activity tolerance, Decreased strength, Decreased range of motion, Decreased coordination, Impaired UE functional use, Increased fascial restrictions  Visit Diagnosis: Other lack of coordination  Other symptoms and signs involving the musculoskeletal system    Problem List Patient Active Problem List   Diagnosis Date Noted  . Cerebral embolism with cerebral infarction 08/01/2017  . CVA (cerebral vascular accident) ([place]) 08/01/2017  . History of CVA with residual deficit   . Coronary artery disease involving native coronary artery of native heart without angina pectoris   . Essential hypertension   . Diabetes mellitus type 2 in obese ([place])   . Class 1 obesity due to excess calories with serious comorbidity and body mass index (BMI) of 32.0 to 32.9 in adult 09/09/2015  . Sedentary lifestyle 09/09/2015  . Hyperlipidemia 02/24/2015  . Essential hypertension, benign 02/24/2015  . Exertional dyspnea 10/18/2012  . Diastolic heart failure ([place]) 10/01/2011  . Obstructive sleep apnea 09/02/2011  . LV dysfunction 09/02/2011  . ERECTILE DYSFUNCTION, SECONDARY TO MEDICATION 04/14/2010  . Type 2 diabetes mellitus with vascular disease ([place]) 10/02/2009  . ACUT MI SUBENDOCARDIAL INFARCT INIT EPIS CARE 10/02/2009  . CORONARY ATHEROSCLEROSIS NATIVE CORONARY ARTERY 10/02/2009  . CHEST PAIN 10/02/2009   Guadelupe Sabin, OTR/L  [phone] 09/08/2017, 12:10 PM  [facility] [address] [place], [place], [address] Phone: [phone]   Fax:  [phone]  Name: Alex Cortez MRN: [id number] Date of Birth: [birth year]

## 2017-09-13 ENCOUNTER — Ambulatory Visit (HOSPITAL_COMMUNITY)
Payer: Medicare Other | Attending: Student in an Organized Health Care Education/Training Program | Admitting: Occupational Therapy

## 2017-09-13 ENCOUNTER — Encounter (HOSPITAL_COMMUNITY): Payer: Self-pay | Admitting: Occupational Therapy

## 2017-09-13 ENCOUNTER — Ambulatory Visit (HOSPITAL_COMMUNITY): Payer: Medicare Other

## 2017-09-13 DIAGNOSIS — R29898 Other symptoms and signs involving the musculoskeletal system: Secondary | ICD-10-CM | POA: Insufficient documentation

## 2017-09-13 DIAGNOSIS — I69351 Hemiplegia and hemiparesis following cerebral infarction affecting right dominant side: Secondary | ICD-10-CM | POA: Diagnosis not present

## 2017-09-13 DIAGNOSIS — R278 Other lack of coordination: Secondary | ICD-10-CM | POA: Diagnosis not present

## 2017-09-13 NOTE — Therapy (Signed)
[place], [place], [address] Phone: [phone]   Fax:  [phone]  Occupational Therapy Treatment  Patient Details  Name: Alex Cortez MRN: [id number] Date of Birth: [birth year] Referring Provider: Lalla Brothers, MD   Encounter Date: 09/13/2017  OT End of Session - 09/13/17 1028    Visit Number  11    Number of Visits  16    Date for OT Re-Evaluation  10/08/17    Authorization Type  1) Medicare A & B; 2) BCBS    Authorization Time Period  BCBS: 75 visits/year; 08/18/17: Discussed with speech and PT, speech will use 3, OT will use 35, and PT will use 35    Authorization - Visit Number  11    Authorization - Number of Visits  35    OT Start Time  0945    OT Stop Time  1029    OT Time Calculation (min)  44 min    Activity Tolerance  Patient tolerated treatment well    Behavior During Therapy  WFL for tasks assessed/performed       Past Medical History:  Diagnosis Date  . CAD (coronary artery disease)    nonobstructive CAD/patent LAD stent site; EF 65%, 08/2011; NSTEMI/DES LAD, 6/20 11  . Chest pain   . Diabetes mellitus    insulin-dependent  . Dyslipidemia   . HTN (hypertension)   . Post MI syndrome ([place]) 08-29-2009  . Stroke [facility]) 10/22/2012   Left inferior pons    Past Surgical History:  Procedure Laterality Date  . CORONARY ANGIOPLASTY WITH STENT PLACEMENT      There were no vitals filed for this visit.  Subjective Assessment - 09/13/17 0941    Subjective   S: My fingers feel kind of puffy.     Currently in Pain?  No/denies         [facility] OT Assessment - 09/13/17 0941      Assessment   Medical Diagnosis  s/p L CVA      Precautions   Precautions  None               OT Treatments/Exercises (OP) - 09/13/17 0941      Exercises   Exercises  Wrist;Hand;Shoulder;Elbow      Shoulder Exercises: Standing   Protraction  Theraband;10 reps    Theraband Level (Shoulder Protraction)   Level 3 (Green)    Horizontal ABduction  Theraband;10 reps    Theraband Level (Shoulder Horizontal ABduction)  Level 3 (Green)    External Rotation  Theraband;10 reps    Theraband Level (Shoulder External Rotation)  Level 3 (Green)    Flexion  Theraband;10 reps    Theraband Level (Shoulder Flexion)  Level 3 (Green)    ABduction  Theraband;10 reps    Theraband Level (Shoulder ABduction)  Level 3 (Green)    Extension  [employer] reps    Theraband Level (Shoulder Extension)  Level 3 (Green)    Row  [employer] reps    Theraband Level (Shoulder Row)  Level 3 (Green)    Retraction  Theraband;10 reps    Theraband Level (Shoulder Retraction)  Level 3 (Green)      Shoulder Exercises: Therapy Ball   Other Therapy Ball Exercises  green striped therapy ball: flexion, horizontal abduction, circles each direction, diagonals, 10X each      Shoulder Exercises: ROM/Strengthening   Cybex Press  2 plate;10 reps    Ball on Wall  1' flexion  Additional Elbow Exercises   Theraputty - Flatten  red-pt then used pvc pipe to cut circles in flattened putty working on grip strength and wrist ROM    Theraputty - Roll  red    Hand Gripper with Small Beads  all beads gripper at 35#    Theraputty - Pinch  red-lateral and 3 point              OT Education - 09/13/17 0943    Education provided  Yes    Education Details  theraband strengthening    Person(s) Educated  Patient    Methods  Explanation;Demonstration;Handout    Comprehension  Verbalized understanding;Returned demonstration       OT Short Term Goals - 09/01/17 1051      OT SHORT TERM GOAL #1   Title  Pt will be provided with and educated on HEP to promote improved use of RUE as dominant during B/IADLs.     Time  4    Period  Weeks    Status  On-going      OT SHORT TERM GOAL #2   Title  Pt will improve RUE strength to 4/5 to improve ability to perform dressing tasks using RUE as dominant.     Baseline  6/20: met with exception  of wrist flexion 4-/5 and wrist extension 3+/5    Time  4    Period  Weeks    Status  Partially Met      OT SHORT TERM GOAL #3   Title  Pt will improve right grip strength by 10# and pinch strength by 3# to improve ability to hold eating utensils.     Time  4    Period  Weeks    Status  Achieved      OT SHORT TERM GOAL #4   Title  Pt will improve right coordination required for eating tasks by completing 9 hole peg test in 2.5 minutes or less.     Time  4    Period  Weeks    Status  Achieved        OT Long Term Goals - 09/01/17 1053      OT LONG TERM GOAL #1   Title  Pt will return to highest level of functioning and independence using RUE as dominant for B/ADL tasks.     Time  8    Period  Weeks    Status  On-going      OT LONG TERM GOAL #2   Title  Pt will improve RUE strength to 4+/5 or greater to improve ability to perform yardwork tasks using RUE as dominant.     Time  8    Period  Weeks    Status  On-going      OT LONG TERM GOAL #3   Title  Pt will improve right grip strength by 18# and pinch strength by 5# to improve ability to hold and carry objects with right hand.     Time  8    Period  Weeks      OT LONG TERM GOAL #4   Title  Pt will improve right fine motor coordination required for grooming tasks such as shaving by completing 9 hole peg test in under 1 minute.     Time  4    Period  Weeks    Status  On-going            Plan - 09/13/17 1024    Clinical Impression  Statement  A: Continued with shoulder strengthening this session adding green theraband strengthening exercises, ball on wall, cybex press, and continuing with therapy ball strengthening. Also added red theraputty for pinch strengthening, attempted to increase gripper resistance for small beads however pt was unable to complete task. Verbal cuing for form and technique during exercises and activities.     Plan  P: Continue with RUE strengthening and stability work, continue working on fine  motor coordination-attempt nuts/bolts activity       Patient will benefit from skilled therapeutic intervention in order to improve the following deficits and impairments:  Decreased activity tolerance, Decreased strength, Decreased range of motion, Decreased coordination, Impaired UE functional use, Increased fascial restrictions  Visit Diagnosis: Hemiplegia and hemiparesis following cerebral infarction affecting right dominant side (HCC)  Other lack of coordination  Other symptoms and signs involving the musculoskeletal system    Problem List Patient Active Problem List   Diagnosis Date Noted  . Cerebral embolism with cerebral infarction 08/01/2017  . CVA (cerebral vascular accident) ([place]) 08/01/2017  . History of CVA with residual deficit   . Coronary artery disease involving native coronary artery of native heart without angina pectoris   . Essential hypertension   . Diabetes mellitus type 2 in obese ([place])   . Class 1 obesity due to excess calories with serious comorbidity and body mass index (BMI) of 32.0 to 32.9 in adult 09/09/2015  . Sedentary lifestyle 09/09/2015  . Hyperlipidemia 02/24/2015  . Essential hypertension, benign 02/24/2015  . Exertional dyspnea 10/18/2012  . Diastolic heart failure ([place]) 10/01/2011  . Obstructive sleep apnea 09/02/2011  . LV dysfunction 09/02/2011  . ERECTILE DYSFUNCTION, SECONDARY TO MEDICATION 04/14/2010  . Type 2 diabetes mellitus with vascular disease ([place]) 10/02/2009  . ACUT MI SUBENDOCARDIAL INFARCT INIT EPIS CARE 10/02/2009  . CORONARY ATHEROSCLEROSIS NATIVE CORONARY ARTERY 10/02/2009  . CHEST PAIN 10/02/2009   Guadelupe Sabin, OTR/L  [phone] 09/13/2017, 10:31 AM  [facility] [address] [place], [place], [address] Phone: [phone]   Fax:  [phone]  Name: Alex Cortez MRN: [id number] Date of Birth: 11-25-1950

## 2017-09-13 NOTE — Patient Instructions (Signed)
Theraband strengthening: Complete 10-15X, 1-2X/day  1) Shoulder protraction  Anchor band in doorway, stand with back to door. Push your hand forward as much as you can to bringing your shoulder blades forward on your rib cage.     2) Shoulder flexion  While standing with back to the door, holding Theraband at hand level, raise arm in front of you.  Keep elbow straight through entire movement.      3) Shoulder horizontal abduction  Standing with a theraband anchored at chest height, begin with arm straight and some tension in the band. Move your arm out to your side (keeping straight the whole time). Bring the affected arm back to midline.      4) Shoulder External Rotation  While holding an elastic band at your side with your elbow bent, start with your hand near your stomach and then pull the band away. Keep your elbow at your side the entire time.     5) Shoulder abduction  While holding an elastic band at your side, draw up your arm to the side keeping your elbow straight.

## 2017-09-16 ENCOUNTER — Ambulatory Visit (HOSPITAL_COMMUNITY): Payer: Medicare Other

## 2017-09-16 ENCOUNTER — Ambulatory Visit (HOSPITAL_COMMUNITY): Payer: Medicare Other | Admitting: Occupational Therapy

## 2017-09-16 ENCOUNTER — Encounter (HOSPITAL_COMMUNITY): Payer: Self-pay | Admitting: Occupational Therapy

## 2017-09-16 DIAGNOSIS — R29898 Other symptoms and signs involving the musculoskeletal system: Secondary | ICD-10-CM

## 2017-09-16 DIAGNOSIS — I69351 Hemiplegia and hemiparesis following cerebral infarction affecting right dominant side: Secondary | ICD-10-CM

## 2017-09-16 DIAGNOSIS — R278 Other lack of coordination: Secondary | ICD-10-CM

## 2017-09-16 NOTE — Therapy (Signed)
[place], [place], [address] Phone: [phone]   Fax:  [phone]  Occupational Therapy Treatment  Patient Details  Name: Alex Cortez MRN: [id number] Date of Birth: 1950-04-28 Referring Provider: Lalla Brothers, MD   Encounter Date: 09/16/2017  OT End of Session - 09/16/17 1207    Visit Number  12    Number of Visits  16    Date for OT Re-Evaluation  10/08/17    Authorization Type  1) Medicare A & B; 2) BCBS    Authorization Time Period  BCBS: 75 visits/year; 08/18/17: Discussed with speech and PT, speech will use 3, OT will use 35, and PT will use 35    Authorization - Visit Number  12    Authorization - Number of Visits  35    OT Start Time  1120    OT Stop Time  1200    OT Time Calculation (min)  40 min    Activity Tolerance  Patient tolerated treatment well    Behavior During Therapy  WFL for tasks assessed/performed       Past Medical History:  Diagnosis Date  . CAD (coronary artery disease)    nonobstructive CAD/patent LAD stent site; EF 65%, 08/2011; NSTEMI/DES LAD, 6/20 11  . Chest pain   . Diabetes mellitus    insulin-dependent  . Dyslipidemia   . HTN (hypertension)   . Post MI syndrome ([place]) 08-29-2009  . Stroke [facility]) 10/22/2012   Left inferior pons    Past Surgical History:  Procedure Laterality Date  . CORONARY ANGIOPLASTY WITH STENT PLACEMENT      There were no vitals filed for this visit.  Subjective Assessment - 09/16/17 1128    Subjective   S: I haven't had a chance to do my band exercises.     Currently in Pain?  No/denies         [facility] OT Assessment - 09/16/17 1128      Assessment   Medical Diagnosis  s/p L CVA      Precautions   Precautions  None               OT Treatments/Exercises (OP) - 09/16/17 1128      Exercises   Exercises  Wrist;Hand;Shoulder;Elbow      Shoulder Exercises: Standing   Protraction  Theraband;12 reps    Theraband Level  (Shoulder Protraction)  Level 3 (Green)    Horizontal ABduction  Theraband;12 reps    Theraband Level (Shoulder Horizontal ABduction)  Level 3 (Green)    External Rotation  Theraband;12 reps    Theraband Level (Shoulder External Rotation)  Level 3 (Green)    Flexion  Theraband;12 reps    Theraband Level (Shoulder Flexion)  Level 3 (Green)    ABduction  Theraband;12 reps    Theraband Level (Shoulder ABduction)  Level 3 (Green)      Shoulder Exercises: Therapy Ball   Other Therapy Ball Exercises  green striped therapy ball: flexion, horizontal abduction, circles each direction, diagonals, 15X each      Shoulder Exercises: ROM/Strengthening   Cybex Press  2 plate;10 reps    Cybex Row  2 plate;10 reps    Ball on Wall  1' flexion      Elbow Exercises   Forearm Supination  Theraband;15 reps    Theraband Level (Supination)  Level 3 (Green)      Additional Elbow Exercises   Hand Gripper with Medium Beads  all beads  gripper at 42#    Hand Gripper with Small Beads  all beads gripper at 35#      Additional Wrist Exercises   Sponges  Pt used green clothespin in lateral pinch to stack 2 stacks of 5 sponges. Pt stabilizing wrist with left hand to decrease tremors in right thumb.       Fine Motor Coordination (Hand/Wrist)   Fine Motor Coordination  Nuts and Bolts    Nuts and Bolts  Pt worked on threading nuts onto bolts, mod difficulty due to thumb tremoring/jerking               OT Short Term Goals - 09/01/17 1051      OT SHORT TERM GOAL #1   Title  Pt will be provided with and educated on HEP to promote improved use of RUE as dominant during B/IADLs.     Time  4    Period  Weeks    Status  On-going      OT SHORT TERM GOAL #2   Title  Pt will improve RUE strength to 4/5 to improve ability to perform dressing tasks using RUE as dominant.     Baseline  6/20: met with exception of wrist flexion 4-/5 and wrist extension 3+/5    Time  4    Period  Weeks    Status  Partially Met       OT SHORT TERM GOAL #3   Title  Pt will improve right grip strength by 10# and pinch strength by 3# to improve ability to hold eating utensils.     Time  4    Period  Weeks    Status  Achieved      OT SHORT TERM GOAL #4   Title  Pt will improve right coordination required for eating tasks by completing 9 hole peg test in 2.5 minutes or less.     Time  4    Period  Weeks    Status  Achieved        OT Long Term Goals - 09/01/17 1053      OT LONG TERM GOAL #1   Title  Pt will return to highest level of functioning and independence using RUE as dominant for B/ADL tasks.     Time  8    Period  Weeks    Status  On-going      OT LONG TERM GOAL #2   Title  Pt will improve RUE strength to 4+/5 or greater to improve ability to perform yardwork tasks using RUE as dominant.     Time  8    Period  Weeks    Status  On-going      OT LONG TERM GOAL #3   Title  Pt will improve right grip strength by 18# and pinch strength by 5# to improve ability to hold and carry objects with right hand.     Time  8    Period  Weeks      OT LONG TERM GOAL #4   Title  Pt will improve right fine motor coordination required for grooming tasks such as shaving by completing 9 hole peg test in under 1 minute.     Time  4    Period  Weeks    Status  On-going            Plan - 09/16/17 1207    Clinical Impression Statement  A: Continued with shoulder strengthening increasing theraband and therapy ball  repetitions, added cybex press. Also added green theraband for supination, mod difficulty. Pt continues to have difficulty with coordination tasks mostly due to right thumb tremors, is able to decrease tremors by stabilizing thumb or wrist with left hand. Verbal cuing for form and technique during exercises.     Plan  P: continue with fine motor coordination and RUE strengthening/stability exercises, attempt ball on wall in abduction       Patient will benefit from skilled therapeutic intervention in  order to improve the following deficits and impairments:  Decreased activity tolerance, Decreased strength, Decreased range of motion, Decreased coordination, Impaired UE functional use, Increased fascial restrictions  Visit Diagnosis: Hemiplegia and hemiparesis following cerebral infarction affecting right dominant side (HCC)  Other lack of coordination  Other symptoms and signs involving the musculoskeletal system    Problem List Patient Active Problem List   Diagnosis Date Noted  . Cerebral embolism with cerebral infarction 08/01/2017  . CVA (cerebral vascular accident) ([place]) 08/01/2017  . History of CVA with residual deficit   . Coronary artery disease involving native coronary artery of native heart without angina pectoris   . Essential hypertension   . Diabetes mellitus type 2 in obese ([place])   . Class 1 obesity due to excess calories with serious comorbidity and body mass index (BMI) of 32.0 to 32.9 in adult 09/09/2015  . Sedentary lifestyle 09/09/2015  . Hyperlipidemia 02/24/2015  . Essential hypertension, benign 02/24/2015  . Exertional dyspnea 10/18/2012  . Diastolic heart failure ([place]) 10/01/2011  . Obstructive sleep apnea 09/02/2011  . LV dysfunction 09/02/2011  . ERECTILE DYSFUNCTION, SECONDARY TO MEDICATION 04/14/2010  . Type 2 diabetes mellitus with vascular disease ([place]) 10/02/2009  . ACUT MI SUBENDOCARDIAL INFARCT INIT EPIS CARE 10/02/2009  . CORONARY ATHEROSCLEROSIS NATIVE CORONARY ARTERY 10/02/2009  . CHEST PAIN 10/02/2009   Guadelupe Sabin, OTR/L  [phone] 09/16/2017, 12:10 PM  [facility] [address] [place], [place], [address] Phone: [phone]   Fax:  [phone]  Name: Alex Cortez MRN: [id number] Date of Birth: Dec 25, 1950

## 2017-09-20 ENCOUNTER — Ambulatory Visit (HOSPITAL_COMMUNITY): Payer: Medicare Other

## 2017-09-20 ENCOUNTER — Ambulatory Visit (HOSPITAL_COMMUNITY): Payer: Medicare Other | Admitting: Occupational Therapy

## 2017-09-20 ENCOUNTER — Encounter (HOSPITAL_COMMUNITY): Payer: Self-pay | Admitting: Occupational Therapy

## 2017-09-20 DIAGNOSIS — I69351 Hemiplegia and hemiparesis following cerebral infarction affecting right dominant side: Secondary | ICD-10-CM

## 2017-09-20 DIAGNOSIS — R278 Other lack of coordination: Secondary | ICD-10-CM | POA: Diagnosis not present

## 2017-09-20 DIAGNOSIS — I10 Essential (primary) hypertension: Secondary | ICD-10-CM | POA: Diagnosis not present

## 2017-09-20 DIAGNOSIS — E1165 Type 2 diabetes mellitus with hyperglycemia: Secondary | ICD-10-CM | POA: Diagnosis not present

## 2017-09-20 DIAGNOSIS — R29898 Other symptoms and signs involving the musculoskeletal system: Secondary | ICD-10-CM

## 2017-09-20 DIAGNOSIS — I63419 Cerebral infarction due to embolism of unspecified middle cerebral artery: Secondary | ICD-10-CM | POA: Diagnosis not present

## 2017-09-20 DIAGNOSIS — E782 Mixed hyperlipidemia: Secondary | ICD-10-CM | POA: Diagnosis not present

## 2017-09-20 LAB — LIPID PANEL
Cholesterol: 138 (ref 0–200)
HDL: 37 (ref 35–70)
LDL Cholesterol: 86
Triglycerides: 77 (ref 40–160)

## 2017-09-20 LAB — HEMOGLOBIN A1C: HEMOGLOBIN A1C: 8.7 — AB (ref 4.0–6.0)

## 2017-09-20 LAB — BASIC METABOLIC PANEL
BUN: 14 (ref 4–21)
CREATININE: 1.1 (ref 0.6–1.3)

## 2017-09-20 NOTE — Therapy (Signed)
[place], [place], [address] Phone: [phone]   Fax:  [phone]  Occupational Therapy Treatment  Patient Details  Name: Alex Cortez MRN: [id number] Date of Birth: 05-Sep-1950 Referring Provider: Lalla Brothers, MD   Encounter Date: 09/20/2017  OT End of Session - 09/20/17 1118    Visit Number  13    Number of Visits  16    Date for OT Re-Evaluation  10/08/17    Authorization Type  1) Medicare A & B; 2) BCBS    Authorization Time Period  BCBS: 75 visits/year; 08/18/17: Discussed with speech and PT, speech will use 3, OT will use 35, and PT will use 35    Authorization - Visit Number  13    Authorization - Number of Visits  35    OT Start Time  0940    OT Stop Time  1027    OT Time Calculation (min)  47 min    Activity Tolerance  Patient tolerated treatment well    Behavior During Therapy  WFL for tasks assessed/performed       Past Medical History:  Diagnosis Date  . CAD (coronary artery disease)    nonobstructive CAD/patent LAD stent site; EF 65%, 08/2011; NSTEMI/DES LAD, 6/20 11  . Chest pain   . Diabetes mellitus    insulin-dependent  . Dyslipidemia   . HTN (hypertension)   . Post MI syndrome ([place]) 08-29-2009  . Stroke [facility]) 10/22/2012   Left inferior pons    Past Surgical History:  Procedure Laterality Date  . CORONARY ANGIOPLASTY WITH STENT PLACEMENT      There were no vitals filed for this visit.  Subjective Assessment - 09/20/17 0938    Subjective   S: I did those exercises with the band    Currently in Pain?  No/denies         [facility] OT Assessment - 09/20/17 0938      Assessment   Medical Diagnosis  s/p L CVA      Precautions   Precautions  None               OT Treatments/Exercises (OP) - 09/20/17 1011      Exercises   Exercises  Wrist;Hand;Shoulder;Elbow      Shoulder Exercises: Standing   Protraction  Theraband;15 reps    Theraband Level (Shoulder  Protraction)  Level 3 (Green)    Horizontal ABduction  Theraband;15 reps    Theraband Level (Shoulder Horizontal ABduction)  Level 3 (Green)    External Rotation  Theraband;15 reps    Theraband Level (Shoulder External Rotation)  Level 3 (Green)    Flexion  Theraband;15 reps    Theraband Level (Shoulder Flexion)  Level 3 (Green)    ABduction  Theraband;15 reps    Theraband Level (Shoulder ABduction)  Level 3 (Green)      Shoulder Exercises: Therapy Ball   Other Therapy Ball Exercises  green therapy ball: flexion, horizontal abduction, circles each direction, diagonals, 15X each      Shoulder Exercises: ROM/Strengthening   Cybex Press  3 plate;15 reps    Cybex Row  2 plate;15 reps    Over Head Lace  2'    Ball on Wall  1' flexion, 1' abduction      Elbow Exercises   Forearm Supination  Theraband;15 reps    Theraband Level (Supination)  Level 3 (Green)      Additional Elbow Exercises   Hand Gripper with  Small Beads  all beads gripper at 35#      Fine Motor Coordination (Hand/Wrist)   Fine Motor Coordination  Small Pegboard    Small Pegboard  Pt used tweezers to place pegs into pegboard. Mod difficulty due to tremors, pt able to control by using slightly increased pressure and using a 3 point pinch grasp. Increased time, 4 pegs placed               OT Short Term Goals - 09/01/17 1051      OT SHORT TERM GOAL #1   Title  Pt will be provided with and educated on HEP to promote improved use of RUE as dominant during B/IADLs.     Time  4    Period  Weeks    Status  On-going      OT SHORT TERM GOAL #2   Title  Pt will improve RUE strength to 4/5 to improve ability to perform dressing tasks using RUE as dominant.     Baseline  6/20: met with exception of wrist flexion 4-/5 and wrist extension 3+/5    Time  4    Period  Weeks    Status  Partially Met      OT SHORT TERM GOAL #3   Title  Pt will improve right grip strength by 10# and pinch strength by 3# to improve ability  to hold eating utensils.     Time  4    Period  Weeks    Status  Achieved      OT SHORT TERM GOAL #4   Title  Pt will improve right coordination required for eating tasks by completing 9 hole peg test in 2.5 minutes or less.     Time  4    Period  Weeks    Status  Achieved        OT Long Term Goals - 09/01/17 1053      OT LONG TERM GOAL #1   Title  Pt will return to highest level of functioning and independence using RUE as dominant for B/ADL tasks.     Time  8    Period  Weeks    Status  On-going      OT LONG TERM GOAL #2   Title  Pt will improve RUE strength to 4+/5 or greater to improve ability to perform yardwork tasks using RUE as dominant.     Time  8    Period  Weeks    Status  On-going      OT LONG TERM GOAL #3   Title  Pt will improve right grip strength by 18# and pinch strength by 5# to improve ability to hold and carry objects with right hand.     Time  8    Period  Weeks      OT LONG TERM GOAL #4   Title  Pt will improve right fine motor coordination required for grooming tasks such as shaving by completing 9 hole peg test in under 1 minute.     Time  4    Period  Weeks    Status  On-going            Plan - 09/20/17 1118    Clinical Impression Statement  A: Continued with RUE strengthening, adding ball on wall in abduction and continuing with theraband and therapy ball exercises. Also added overhead lacing for RUE strengthening and coordination work. Pt with moderate fatigue during session, rest breaks provided as  needed. Pt with improvement in right thumb control with fine motor exercises, continues to have difficulty with tweezer use. Verbal cuing for form and technique during exercises.     Plan  P: Continue with RUE strengthening and fine motor coordination tasks       Patient will benefit from skilled therapeutic intervention in order to improve the following deficits and impairments:  Decreased activity tolerance, Decreased strength, Decreased  range of motion, Decreased coordination, Impaired UE functional use, Increased fascial restrictions  Visit Diagnosis: Hemiplegia and hemiparesis following cerebral infarction affecting right dominant side (HCC)  Other lack of coordination  Other symptoms and signs involving the musculoskeletal system    Problem List Patient Active Problem List   Diagnosis Date Noted  . Cerebral embolism with cerebral infarction 08/01/2017  . CVA (cerebral vascular accident) ([place]) 08/01/2017  . History of CVA with residual deficit   . Coronary artery disease involving native coronary artery of native heart without angina pectoris   . Essential hypertension   . Diabetes mellitus type 2 in obese ([place])   . Class 1 obesity due to excess calories with serious comorbidity and body mass index (BMI) of 32.0 to 32.9 in adult 09/09/2015  . Sedentary lifestyle 09/09/2015  . Hyperlipidemia 02/24/2015  . Essential hypertension, benign 02/24/2015  . Exertional dyspnea 10/18/2012  . Diastolic heart failure ([place]) 10/01/2011  . Obstructive sleep apnea 09/02/2011  . LV dysfunction 09/02/2011  . ERECTILE DYSFUNCTION, SECONDARY TO MEDICATION 04/14/2010  . Type 2 diabetes mellitus with vascular disease ([place]) 10/02/2009  . ACUT MI SUBENDOCARDIAL INFARCT INIT EPIS CARE 10/02/2009  . CORONARY ATHEROSCLEROSIS NATIVE CORONARY ARTERY 10/02/2009  . CHEST PAIN 10/02/2009   Guadelupe Sabin, OTR/L  [phone] 09/20/2017, 11:20 AM  [place] [address] [place], [place], [address] Phone: [phone]   Fax:  [phone]  Name: Alex Cortez MRN: [id number] Date of Birth: [birth year]

## 2017-09-22 ENCOUNTER — Ambulatory Visit (HOSPITAL_COMMUNITY): Payer: Medicare Other

## 2017-09-22 ENCOUNTER — Encounter (HOSPITAL_COMMUNITY): Payer: Self-pay

## 2017-09-22 ENCOUNTER — Other Ambulatory Visit: Payer: Self-pay

## 2017-09-22 DIAGNOSIS — R278 Other lack of coordination: Secondary | ICD-10-CM

## 2017-09-22 DIAGNOSIS — I69351 Hemiplegia and hemiparesis following cerebral infarction affecting right dominant side: Secondary | ICD-10-CM | POA: Diagnosis not present

## 2017-09-22 DIAGNOSIS — R29898 Other symptoms and signs involving the musculoskeletal system: Secondary | ICD-10-CM | POA: Diagnosis not present

## 2017-09-22 NOTE — Therapy (Signed)
[place], [place], [address] Phone: [phone]   Fax:  [phone]  Occupational Therapy Treatment  Patient Details  Name: Alex Cortez MRN: [id number] Date of Birth: [birth year] Referring Provider: Lalla Brothers, MD   Encounter Date: 09/22/2017  OT End of Session - 09/22/17 0948    Visit Number  14    Number of Visits  16    Date for OT Re-Evaluation  10/08/17    Authorization Type  1) Medicare A & B; 2) BCBS    Authorization Time Period  BCBS: 75 visits/year; 08/18/17: Discussed with speech and PT, speech will use 3, OT will use 35, and PT will use 35    Authorization - Visit Number  14    Authorization - Number of Visits  35    OT Start Time  0949    OT Stop Time  1028    OT Time Calculation (min)  39 min    Activity Tolerance  Patient tolerated treatment well    Behavior During Therapy  WFL for tasks assessed/performed       Past Medical History:  Diagnosis Date  . CAD (coronary artery disease)    nonobstructive CAD/patent LAD stent site; EF 65%, 08/2011; NSTEMI/DES LAD, 6/20 11  . Chest pain   . Diabetes mellitus    insulin-dependent  . Dyslipidemia   . HTN (hypertension)   . Post MI syndrome ([place]) 08-29-2009  . Stroke [facility]) 10/22/2012   Left inferior pons    Past Surgical History:  Procedure Laterality Date  . CORONARY ANGIOPLASTY WITH STENT PLACEMENT      There were no vitals filed for this visit.  Subjective Assessment - 09/22/17 0950    Subjective   S: Sometimes my hand hurts at night and swells.    Currently in Pain?  Yes    Pain Score  5     Pain Location  Hand    Pain Orientation  Right    Pain Descriptors / Indicators  Tingling;Aching    Pain Type  Chronic pain    Pain Radiating Towards  N/A    Pain Onset  More than a month ago    Pain Frequency  Intermittent    Aggravating Factors   worse at night    Pain Relieving Factors  compression glove    Effect of Pain on Daily  Activities  minimal effect on ADLs    Multiple Pain Sites  No         [facility] OT Assessment - 09/22/17 0947      Assessment   Medical Diagnosis  s/p L CVA      Precautions   Precautions  None               OT Treatments/Exercises (OP) - 09/22/17 0948      Exercises   Exercises  Wrist;Hand;Shoulder;Elbow      Shoulder Exercises: Standing   Protraction  Theraband;15 reps    Theraband Level (Shoulder Protraction)  Level 3 (Green)    Horizontal ABduction  Theraband;15 reps    Theraband Level (Shoulder Horizontal ABduction)  Level 3 (Green)    External Rotation  Theraband;15 reps    Theraband Level (Shoulder External Rotation)  Level 3 (Green)    Flexion  Theraband;15 reps    Theraband Level (Shoulder Flexion)  Level 3 (Green)    ABduction  Theraband;15 reps    Theraband Level (Shoulder ABduction)  Level 3 (Green)  Shoulder Exercises: Therapy Ball   Other Therapy Ball Exercises  green therapy ball: flexion, overhead press; horizontal abduction, circles each direction, diagonals, 15X each      Shoulder Exercises: ROM/Strengthening   Cybex Press  3 plate;20 reps    Cybex Row  3 plate;20 reps      Additional Elbow Exercises   Hand Gripper with Small Beads  all beads gripper at 35#      Hand Exercises   Other Hand Exercises  Patient worked on clipping clothespins on box at head height using thumb and alternating fingers. Moderate difficulty with 3rd and 4th digits due to tremors in thumb             OT Education - 09/22/17 0948    Education provided  No       OT Short Term Goals - 09/01/17 1051      OT SHORT TERM GOAL #1   Title  Pt will be provided with and educated on HEP to promote improved use of RUE as dominant during B/IADLs.     Time  4    Period  Weeks    Status  On-going      OT SHORT TERM GOAL #2   Title  Pt will improve RUE strength to 4/5 to improve ability to perform dressing tasks using RUE as dominant.     Baseline  6/20: met with  exception of wrist flexion 4-/5 and wrist extension 3+/5    Time  4    Period  Weeks    Status  Partially Met      OT SHORT TERM GOAL #3   Title  Pt will improve right grip strength by 10# and pinch strength by 3# to improve ability to hold eating utensils.     Time  4    Period  Weeks    Status  Achieved      OT SHORT TERM GOAL #4   Title  Pt will improve right coordination required for eating tasks by completing 9 hole peg test in 2.5 minutes or less.     Time  4    Period  Weeks    Status  Achieved        OT Long Term Goals - 09/01/17 1053      OT LONG TERM GOAL #1   Title  Pt will return to highest level of functioning and independence using RUE as dominant for B/ADL tasks.     Time  8    Period  Weeks    Status  On-going      OT LONG TERM GOAL #2   Title  Pt will improve RUE strength to 4+/5 or greater to improve ability to perform yardwork tasks using RUE as dominant.     Time  8    Period  Weeks    Status  On-going      OT LONG TERM GOAL #3   Title  Pt will improve right grip strength by 18# and pinch strength by 5# to improve ability to hold and carry objects with right hand.     Time  8    Period  Weeks      OT LONG TERM GOAL #4   Title  Pt will improve right fine motor coordination required for grooming tasks such as shaving by completing 9 hole peg test in under 1 minute.     Time  4    Period  Weeks    Status  On-going            Plan - 09/22/17 1001    Clinical Impression Statement  A: Continued with RUE strengthening, scapular stability exercises, fine motor coordination, pinch, and grip strengthening. Occasional rest breaks required due to fatigue during shoulder strengthening. Reps increased for cybex row and press. Patient experiencing moderate difficulty with opposition of the 3rd and 4th digits due to thumb tremors. Moderate verbal cuing in order to not bend elbow during shoulder strengthening exercises.     Plan  P: Continue with RUE  strengthening and scapular stability exercises. Attempt ball drops. Focus on 3 point pinch and fine motor coordination tasks.    Consulted and Agree with Plan of Care  Patient;Family member/caregiver    Family Member Consulted  wife       Patient will benefit from skilled therapeutic intervention in order to improve the following deficits and impairments:  Decreased activity tolerance, Decreased strength, Decreased range of motion, Decreased coordination, Impaired UE functional use, Increased fascial restrictions  Visit Diagnosis: Hemiplegia and hemiparesis following cerebral infarction affecting right dominant side (HCC)  Other lack of coordination  Other symptoms and signs involving the musculoskeletal system    Problem List Patient Active Problem List   Diagnosis Date Noted  . Cerebral embolism with cerebral infarction 08/01/2017  . CVA (cerebral vascular accident) ([place]) 08/01/2017  . History of CVA with residual deficit   . Coronary artery disease involving native coronary artery of native heart without angina pectoris   . Essential hypertension   . Diabetes mellitus type 2 in obese ([place])   . Class 1 obesity due to excess calories with serious comorbidity and body mass index (BMI) of 32.0 to 32.9 in adult 09/09/2015  . Sedentary lifestyle 09/09/2015  . Hyperlipidemia 02/24/2015  . Essential hypertension, benign 02/24/2015  . Exertional dyspnea 10/18/2012  . Diastolic heart failure ([place]) 10/01/2011  . Obstructive sleep apnea 09/02/2011  . LV dysfunction 09/02/2011  . ERECTILE DYSFUNCTION, SECONDARY TO MEDICATION 04/14/2010  . Type 2 diabetes mellitus with vascular disease ([place]) 10/02/2009  . ACUT MI SUBENDOCARDIAL INFARCT INIT EPIS CARE 10/02/2009  . CORONARY ATHEROSCLEROSIS NATIVE CORONARY ARTERY 10/02/2009  . CHEST PAIN 10/02/2009    Roderic Palau, OT student 09/22/2017, 10:28 AM  [place],  [place], [address] Phone: [phone]   Fax:  [phone]  Name: Alex Cortez MRN: [id number] Date of Birth: [birth year]

## 2017-09-23 DIAGNOSIS — I10 Essential (primary) hypertension: Secondary | ICD-10-CM | POA: Diagnosis not present

## 2017-09-23 DIAGNOSIS — Z1389 Encounter for screening for other disorder: Secondary | ICD-10-CM | POA: Diagnosis not present

## 2017-09-23 DIAGNOSIS — E782 Mixed hyperlipidemia: Secondary | ICD-10-CM | POA: Diagnosis not present

## 2017-09-23 DIAGNOSIS — E1165 Type 2 diabetes mellitus with hyperglycemia: Secondary | ICD-10-CM | POA: Diagnosis not present

## 2017-09-23 DIAGNOSIS — I63419 Cerebral infarction due to embolism of unspecified middle cerebral artery: Secondary | ICD-10-CM | POA: Diagnosis not present

## 2017-09-23 DIAGNOSIS — Z6834 Body mass index (BMI) 34.0-34.9, adult: Secondary | ICD-10-CM | POA: Diagnosis not present

## 2017-09-23 DIAGNOSIS — D329 Benign neoplasm of meninges, unspecified: Secondary | ICD-10-CM | POA: Diagnosis not present

## 2017-09-23 DIAGNOSIS — Z1331 Encounter for screening for depression: Secondary | ICD-10-CM | POA: Diagnosis not present

## 2017-09-27 ENCOUNTER — Encounter (HOSPITAL_COMMUNITY): Payer: Self-pay | Admitting: Occupational Therapy

## 2017-09-27 ENCOUNTER — Ambulatory Visit (HOSPITAL_COMMUNITY): Payer: Medicare Other | Admitting: Occupational Therapy

## 2017-09-27 ENCOUNTER — Ambulatory Visit (HOSPITAL_COMMUNITY): Payer: Medicare Other

## 2017-09-27 DIAGNOSIS — R278 Other lack of coordination: Secondary | ICD-10-CM | POA: Diagnosis not present

## 2017-09-27 DIAGNOSIS — R29898 Other symptoms and signs involving the musculoskeletal system: Secondary | ICD-10-CM

## 2017-09-27 DIAGNOSIS — I69351 Hemiplegia and hemiparesis following cerebral infarction affecting right dominant side: Secondary | ICD-10-CM | POA: Diagnosis not present

## 2017-09-27 NOTE — Therapy (Signed)
[place], [place], [address] Phone: [phone]   Fax:  [phone]  Occupational Therapy Treatment  Patient Details  Name: Alex Cortez MRN: [id number] Date of Birth: 17-Nov-1950 Referring Provider: Lalla Brothers, MD   Encounter Date: 09/27/2017  OT End of Session - 09/27/17 1042    Visit Number  15    Number of Visits  16    Date for OT Re-Evaluation  10/08/17    Authorization Type  1) Medicare A & B; 2) BCBS    Authorization Time Period  BCBS: 75 visits/year; 08/18/17: Discussed with speech and PT, speech will use 3, OT will use 35, and PT will use 35    Authorization - Visit Number  15    Authorization - Number of Visits  35    OT Start Time  0945    OT Stop Time  1027    OT Time Calculation (min)  42 min    Activity Tolerance  Patient tolerated treatment well    Behavior During Therapy  WFL for tasks assessed/performed       Past Medical History:  Diagnosis Date  . CAD (coronary artery disease)    nonobstructive CAD/patent LAD stent site; EF 65%, 08/2011; NSTEMI/DES LAD, 6/20 11  . Chest pain   . Diabetes mellitus    insulin-dependent  . Dyslipidemia   . HTN (hypertension)   . Post MI syndrome ([place]) 08-29-2009  . Stroke [facility]) 10/22/2012   Left inferior pons    Past Surgical History:  Procedure Laterality Date  . CORONARY ANGIOPLASTY WITH STENT PLACEMENT      There were no vitals filed for this visit.  Subjective Assessment - 09/27/17 0955    Subjective   S: My hand and wrist were really hurting last night.     Currently in Pain?  Yes with theraband exercise    Pain Score  8     Pain Location  Wrist    Pain Orientation  Right    Pain Descriptors / Indicators  Aching    Pain Type  Acute pain    Pain Radiating Towards  n/a    Pain Onset  In the past 7 days    Pain Frequency  Intermittent    Aggravating Factors   worse at night, wrist flexion/extension    Pain Relieving Factors  nothing     Effect of Pain on Daily Activities  minimal effect on ADLs    Multiple Pain Sites  No         [facility] OT Assessment - 09/27/17 0955      Assessment   Medical Diagnosis  s/p L CVA      Precautions   Precautions  None               OT Treatments/Exercises (OP) - 09/27/17 1002      Exercises   Exercises  Wrist;Hand;Shoulder;Elbow      Shoulder Exercises: Standing   Protraction  Theraband;15 reps    Theraband Level (Shoulder Protraction)  Level 3 (Green)    Horizontal ABduction  Theraband;15 reps    Theraband Level (Shoulder Horizontal ABduction)  Level 3 (Green)      Shoulder Exercises: Therapy Ball   Other Therapy Ball Exercises  green therapy ball: flexion, horizontal abduction, circles each direction, diagonals, 15X each      Shoulder Exercises: ROM/Strengthening   Cybex Press  3 plate;20 reps    Cybex Row  3 plate;20  reps    Over Head Lace  2' 2#      Additional Elbow Exercises   Theraputty - Roll  red    Theraputty - Grip  red supinated and pronated     Theraputty - Pinch  red-lateral and 3 point       Hand Exercises   Other Hand Exercises  Timed perfection: pt using right hand to place pegs with left stabilizing thumb. Trial 1: 6'15"               OT Short Term Goals - 09/01/17 1051      OT SHORT TERM GOAL #1   Title  Pt will be provided with and educated on HEP to promote improved use of RUE as dominant during B/IADLs.     Time  4    Period  Weeks    Status  On-going      OT SHORT TERM GOAL #2   Title  Pt will improve RUE strength to 4/5 to improve ability to perform dressing tasks using RUE as dominant.     Baseline  6/20: met with exception of wrist flexion 4-/5 and wrist extension 3+/5    Time  4    Period  Weeks    Status  Partially Met      OT SHORT TERM GOAL #3   Title  Pt will improve right grip strength by 10# and pinch strength by 3# to improve ability to hold eating utensils.     Time  4    Period  Weeks    Status   Achieved      OT SHORT TERM GOAL #4   Title  Pt will improve right coordination required for eating tasks by completing 9 hole peg test in 2.5 minutes or less.     Time  4    Period  Weeks    Status  Achieved        OT Long Term Goals - 09/01/17 1053      OT LONG TERM GOAL #1   Title  Pt will return to highest level of functioning and independence using RUE as dominant for B/ADL tasks.     Time  8    Period  Weeks    Status  On-going      OT LONG TERM GOAL #2   Title  Pt will improve RUE strength to 4+/5 or greater to improve ability to perform yardwork tasks using RUE as dominant.     Time  8    Period  Weeks    Status  On-going      OT LONG TERM GOAL #3   Title  Pt will improve right grip strength by 18# and pinch strength by 5# to improve ability to hold and carry objects with right hand.     Time  8    Period  Weeks      OT LONG TERM GOAL #4   Title  Pt will improve right fine motor coordination required for grooming tasks such as shaving by completing 9 hole peg test in under 1 minute.     Time  4    Period  Weeks    Status  On-going            Plan - 09/27/17 1042    Clinical Impression Statement  A: Pt reporting increased soreness/pain and swelling in wrist and fingers since previous session, occuring more at night. This morning reports no pain until attempting theraband  exercises. Did not complete all theraband shoulder exercises, held supination until wrist/hand improves. Pt able to complete therapy ball and cybex without difficulty. Pt with increased tremors in right thumb today, limiting success with coordination tasks. Discussed hand/wrist with pt and wife, and suggested calling neurologist to get their insight on the issue. Pt does report improvement in swelling with edema glove.  Verbal cuing for form and technique with exercises. Pt reports no pain at end of session.     Plan  P: Follow up on calling MD. Reassessment for possible discharge       Patient  will benefit from skilled therapeutic intervention in order to improve the following deficits and impairments:  Decreased activity tolerance, Decreased strength, Decreased range of motion, Decreased coordination, Impaired UE functional use, Increased fascial restrictions  Visit Diagnosis: Other lack of coordination  Other symptoms and signs involving the musculoskeletal system    Problem List Patient Active Problem List   Diagnosis Date Noted  . Cerebral embolism with cerebral infarction 08/01/2017  . CVA (cerebral vascular accident) ([place]) 08/01/2017  . History of CVA with residual deficit   . Coronary artery disease involving native coronary artery of native heart without angina pectoris   . Essential hypertension   . Diabetes mellitus type 2 in obese ([place])   . Class 1 obesity due to excess calories with serious comorbidity and body mass index (BMI) of 32.0 to 32.9 in adult 09/09/2015  . Sedentary lifestyle 09/09/2015  . Hyperlipidemia 02/24/2015  . Essential hypertension, benign 02/24/2015  . Exertional dyspnea 10/18/2012  . Diastolic heart failure ([place]) 10/01/2011  . Obstructive sleep apnea 09/02/2011  . LV dysfunction 09/02/2011  . ERECTILE DYSFUNCTION, SECONDARY TO MEDICATION 04/14/2010  . Type 2 diabetes mellitus with vascular disease ([place]) 10/02/2009  . ACUT MI SUBENDOCARDIAL INFARCT INIT EPIS CARE 10/02/2009  . CORONARY ATHEROSCLEROSIS NATIVE CORONARY ARTERY 10/02/2009  . CHEST PAIN 10/02/2009   Guadelupe Sabin, OTR/L  [phone] 09/27/2017, 10:50 AM  [place], [place], [address] Phone: [phone]   Fax:  [phone]  Name: Alex Cortez MRN: [id number] Date of Birth: [birth year]

## 2017-09-28 ENCOUNTER — Telehealth: Payer: Self-pay | Admitting: Adult Health

## 2017-09-28 NOTE — Telephone Encounter (Signed)
Please refer back to PCP in regards to injury during PT. He did have mild residual right hemiparesis during visit but denied pain. It seems as though this occurred during therapy session. Most likely pain from overuse but should be worked up to ensure no further damage occurred by PCP. Please ensure he has been taking tylenol, ice and elevation in the mean time.

## 2017-09-28 NOTE — Telephone Encounter (Signed)
Pt states that his right hand has been hurting him very bad, even trembling.  If something can be called in he is asking it be sent to  [id number] - [place], [place] [phone] (Phone) [phone] (Fax)

## 2017-09-28 NOTE — Telephone Encounter (Signed)
Alex Billow- please advise Called pt back. He is having new right hand pain. This started after his therapy session last week Thursday. They were using therapy band to try and strengthen hand/wrist. Pain started in wrist Fri/Sat. Pain now in fingers and thumb. He is also having numbness. He cannot bend fingers well. He also reports trembling. He spoke with PCP who deferred him back to [place] to get further instruction. Advised I will speak with Alex Billow who he saw last and we will call back to advise. He verbalized understanding.

## 2017-09-28 NOTE — Telephone Encounter (Signed)
I called pt back. Relayed Jessica's message. He verbalized understanding and will contact PCP (Dr. Pleas Koch) to follow up on wrist/hand pain.

## 2017-09-29 ENCOUNTER — Encounter (HOSPITAL_COMMUNITY): Payer: Self-pay | Admitting: Occupational Therapy

## 2017-09-29 ENCOUNTER — Ambulatory Visit (HOSPITAL_COMMUNITY): Payer: Medicare Other

## 2017-09-29 ENCOUNTER — Ambulatory Visit (HOSPITAL_COMMUNITY): Payer: Medicare Other | Admitting: Occupational Therapy

## 2017-09-29 ENCOUNTER — Other Ambulatory Visit: Payer: Self-pay

## 2017-09-29 DIAGNOSIS — R278 Other lack of coordination: Secondary | ICD-10-CM

## 2017-09-29 DIAGNOSIS — I69351 Hemiplegia and hemiparesis following cerebral infarction affecting right dominant side: Secondary | ICD-10-CM

## 2017-09-29 DIAGNOSIS — R29898 Other symptoms and signs involving the musculoskeletal system: Secondary | ICD-10-CM | POA: Diagnosis not present

## 2017-09-29 NOTE — Therapy (Addendum)
[place], [place], [address] Phone: [phone]   Fax:  [phone]  Occupational Therapy Treatment  Patient Details  Name: Alex Cortez MRN: [id number] Date of Birth: 09-20-1950 Referring Provider: Lalla Brothers, MD   Encounter Date: 09/29/2017  OT End of Session - 09/29/17 1015    Visit Number  16    Number of Visits  16    Authorization Type  1) Medicare A & B; 2) BCBS    Authorization Time Period  BCBS: 75 visits/year; 08/18/17: Discussed with speech and PT, speech will use 3, OT will use 35, and PT will use 35    Authorization - Visit Number  16    Authorization - Number of Visits  35    OT Start Time  0948    OT Stop Time  1013    OT Time Calculation (min)  25 min    Activity Tolerance  Patient tolerated treatment well    Behavior During Therapy  [facility] for tasks assessed/performed       Past Medical History:  Diagnosis Date  . CAD (coronary artery disease)    nonobstructive CAD/patent LAD stent site; EF 65%, 08/2011; NSTEMI/DES LAD, 6/20 11  . Chest pain   . Diabetes mellitus    insulin-dependent  . Dyslipidemia   . HTN (hypertension)   . 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 Stroke [facility]) 10/22/2012   Left inferior pons    Past Surgical History:  Procedure Laterality Date  . CORONARY ANGIOPLASTY WITH STENT PLACEMENT      There were no vitals filed for this visit.  Subjective Assessment - 09/29/17 0952    Subjective   S: Last night about 4 o'clock it was hurting real bad, but now it is fine.    Currently in Pain?  No/denies    Pain Onset  In the past 7 days         [facility] OT Assessment - 09/29/17 0953      Assessment   Medical Diagnosis  s/p L CVA      Precautions   Precautions  None      Observation/Other Assessments   Focus on Therapeutic Outcomes (FOTO)   80/100      Coordination   Right 9 Hole Peg Test  58"      ROM / Strength   AROM / PROM / Strength  Strength       Strength   Overall Strength Comments  Assessed seated, er/IR adducted    Strength Assessment Site  Shoulder;Elbow;Wrist    Right/Left Shoulder  Right    Right Shoulder Flexion  4+/5 same as previous    Right Shoulder ABduction  5/5 previous: 4+/5    Right Shoulder Internal Rotation  5/5 previous: 4+/5    Right Shoulder External Rotation  5/5 previous: 4/5    Right/Left Elbow  Right    Right Elbow Flexion  5/5 previous: 4+/5    Right Elbow Extension  5/5 previous: 4/5    Right/Left Wrist  Right    Right Wrist Flexion  5/5 previous: 4-/5    Right Wrist Extension  5/5 previous: 3+/5    Right Hand Grip (lbs)  35 previous: 34    Right Hand Lateral Pinch  15 lbs same as previous     Right Hand 3 Point Pinch  11 lbs previous: 10               OT Treatments/Exercises (  OP) - 09/29/17 1009      Exercises   Exercises  Wrist;Hand;Shoulder;Elbow             OT Education - 09/29/17 1014    Education provided  No       OT Short Term Goals - 09/29/17 1003      OT SHORT TERM GOAL #1   Title  Pt will be provided with and educated on HEP to promote improved use of RUE as dominant during B/IADLs.     Time  4    Period  Weeks    Status  Achieved      OT SHORT TERM GOAL #2   Title  Pt will improve RUE strength to 4/5 to improve ability to perform dressing tasks using RUE as dominant.     Baseline  6/20: met with exception of wrist flexion 4-/5 and wrist extension 3+/5    Time  4    Period  Weeks    Status  Achieved      OT SHORT TERM GOAL #3   Title  Pt will improve right grip strength by 10# and pinch strength by 3# to improve ability to hold eating utensils.     Time  4    Period  Weeks    Status  Achieved      OT SHORT TERM GOAL #4   Title  Pt will improve right coordination required for eating tasks by completing 9 hole peg test in 2.5 minutes or less.     Time  4    Period  Weeks    Status  Achieved        OT Long Term Goals - 09/29/17 1004      OT LONG  TERM GOAL #1   Title  Pt will return to highest level of functioning and independence using RUE as dominant for B/ADL tasks.     Time  8    Period  Weeks    Status  Not Met      OT LONG TERM GOAL #2   Title  Pt will improve RUE strength to 4+/5 or greater to improve ability to perform yardwork tasks using RUE as dominant.     Time  8    Period  Weeks    Status  Achieved      OT LONG TERM GOAL #3   Title  Pt will improve right grip strength by 18# and pinch strength by 5# to improve ability to hold and carry objects with right hand.     Time  8    Period  Weeks      OT LONG TERM GOAL #4   Title  Pt will improve right fine motor coordination required for grooming tasks such as shaving by completing 9 hole peg test in under 1 minute.     Time  4    Period  Weeks    Status  Achieved            Plan - 09/29/17 1016    Clinical Impression Statement  A: Reassessment performed. Pt reporting continued increased soreness/pain and swelling in hand and fingers since previous session, occuring more at night, soreness in wrist has resolved. Pt also reports pain in upper arm radiating down to hand, believes could be due to insulin monitor placement, is going to see PCP this afternoon and will discuss with MD. This morning reports no pain. Patient is demonstrating improving RUE strength and fine motor coordination. Slight  increases seen in grip and pinch. All goals met except for long term goal for using the RUE as the dominant extremity at his highest level of independence. Patient reports he is still having difficulty using the hand for fine motor tasks at home.     Plan  P: Patient to discharge from skilled OT services and continue with HEP independently.    Consulted and Agree with Plan of Care  Patient       Patient will benefit from skilled therapeutic intervention in order to improve the following deficits and impairments:  Decreased activity tolerance, Decreased strength, Decreased range  of motion, Decreased coordination, Impaired UE functional use, Increased fascial restrictions  Visit Diagnosis: Other lack of coordination  Other symptoms and signs involving the musculoskeletal system  Hemiplegia and hemiparesis following cerebral infarction affecting right dominant side [facility])    Problem List Patient Active Problem List   Diagnosis Date Noted  . Cerebral embolism with cerebral infarction 08/01/2017  . CVA (cerebral vascular accident) ([place]) 08/01/2017  . History of CVA with residual deficit   . Coronary artery disease involving native coronary artery of native heart without angina pectoris   . Essential hypertension   . Diabetes mellitus type 2 in obese ([place])   . Class 1 obesity due to excess calories with serious comorbidity and body mass index (BMI) of 32.0 to 32.9 in adult 09/09/2015  . Sedentary lifestyle 09/09/2015  . Hyperlipidemia 02/24/2015  . Essential hypertension, benign 02/24/2015  . Exertional dyspnea 10/18/2012  . Diastolic heart failure ([place]) 10/01/2011  . Obstructive sleep apnea 09/02/2011  . LV dysfunction 09/02/2011  . ERECTILE DYSFUNCTION, SECONDARY TO MEDICATION 04/14/2010  . Type 2 diabetes mellitus with vascular disease ([place]) 10/02/2009  . ACUT MI SUBENDOCARDIAL INFARCT INIT EPIS CARE 10/02/2009  . CORONARY ATHEROSCLEROSIS NATIVE CORONARY ARTERY 10/02/2009  . CHEST PAIN 10/02/2009    Roderic Palau, OT student 09/29/2017, 10:20 AM  [place], [place], [address] Phone: [phone]   Fax:  [phone]  Name: Alex Cortez MRN: [id number] Date of Birth: [birth year]    OCCUPATIONAL THERAPY DISCHARGE SUMMARY  Visits from Start of Care: 16  Current functional level related to goals / functional outcomes: See above. Pt has made great progress towards goals, is using RUE during all ADLs with minimal difficulty. Soreness/pain in wrist has resolved since last week.    Remaining deficits: Pt continues to have deficits in fine motor coordination, however this may be effected by tremors and weakness in right thumb which was present prior to CVA. Pt also having new pain in upper arm and hand, believes could be due to placement of insulin monitor. Pt will see MD today.    Education / Equipment: HEP on fine motor coordination and RUE strengthening Plan: Patient agrees to discharge.  Patient goals were met. Patient is being discharged due to meeting the stated rehab goals.  ?????     Guadelupe Sabin, OTR/L  [phone] 09/29/2017

## 2017-09-30 DIAGNOSIS — Z6833 Body mass index (BMI) 33.0-33.9, adult: Secondary | ICD-10-CM | POA: Diagnosis not present

## 2017-09-30 DIAGNOSIS — M79641 Pain in right hand: Secondary | ICD-10-CM | POA: Diagnosis not present

## 2017-09-30 DIAGNOSIS — S5411XA Injury of median nerve at forearm level, right arm, initial encounter: Secondary | ICD-10-CM | POA: Diagnosis not present

## 2017-09-30 DIAGNOSIS — G252 Other specified forms of tremor: Secondary | ICD-10-CM | POA: Diagnosis not present

## 2017-10-05 DIAGNOSIS — M79641 Pain in right hand: Secondary | ICD-10-CM | POA: Diagnosis not present

## 2017-10-05 DIAGNOSIS — N3289 Other specified disorders of bladder: Secondary | ICD-10-CM | POA: Diagnosis not present

## 2017-10-05 NOTE — Telephone Encounter (Signed)
Pt wife (on DPR)has called to inform the Dr Burdine's office gave a prescription for Prednisolone.  Wife was told to call and ask if this can be taken with Plavix and all other medications.  Please call

## 2017-10-05 NOTE — Telephone Encounter (Signed)
Called and spoke with wife and relayed Jessica,NP message below. She verbalized understanding and appreciation.

## 2017-10-05 NOTE — Telephone Encounter (Signed)
Jessica please advise

## 2017-10-05 NOTE — Telephone Encounter (Signed)
He should be safe to take prednisone with his current medications. Please advise him that his blood sugars may elevate while taking it but while normalize once prescription completed.

## 2017-10-14 ENCOUNTER — Ambulatory Visit: Payer: Medicare Other | Admitting: "Endocrinology

## 2017-10-26 ENCOUNTER — Encounter: Payer: Medicare Other | Attending: Family Medicine | Admitting: Nutrition

## 2017-10-26 ENCOUNTER — Encounter: Payer: Self-pay | Admitting: "Endocrinology

## 2017-10-26 ENCOUNTER — Ambulatory Visit (INDEPENDENT_AMBULATORY_CARE_PROVIDER_SITE_OTHER): Payer: Medicare Other | Admitting: "Endocrinology

## 2017-10-26 ENCOUNTER — Encounter: Payer: Self-pay | Admitting: Nutrition

## 2017-10-26 VITALS — Ht 75.0 in | Wt 281.0 lb

## 2017-10-26 VITALS — BP 165/82 | HR 96 | Ht 74.0 in | Wt 261.0 lb

## 2017-10-26 DIAGNOSIS — I10 Essential (primary) hypertension: Secondary | ICD-10-CM

## 2017-10-26 DIAGNOSIS — E1165 Type 2 diabetes mellitus with hyperglycemia: Secondary | ICD-10-CM

## 2017-10-26 DIAGNOSIS — E1159 Type 2 diabetes mellitus with other circulatory complications: Secondary | ICD-10-CM | POA: Diagnosis not present

## 2017-10-26 DIAGNOSIS — E6609 Other obesity due to excess calories: Secondary | ICD-10-CM

## 2017-10-26 DIAGNOSIS — I63412 Cerebral infarction due to embolism of left middle cerebral artery: Secondary | ICD-10-CM | POA: Diagnosis not present

## 2017-10-26 DIAGNOSIS — E118 Type 2 diabetes mellitus with unspecified complications: Secondary | ICD-10-CM

## 2017-10-26 DIAGNOSIS — E782 Mixed hyperlipidemia: Secondary | ICD-10-CM

## 2017-10-26 DIAGNOSIS — Z6832 Body mass index (BMI) 32.0-32.9, adult: Secondary | ICD-10-CM | POA: Diagnosis not present

## 2017-10-26 DIAGNOSIS — E669 Obesity, unspecified: Secondary | ICD-10-CM

## 2017-10-26 DIAGNOSIS — Z713 Dietary counseling and surveillance: Secondary | ICD-10-CM | POA: Insufficient documentation

## 2017-10-26 DIAGNOSIS — IMO0002 Reserved for concepts with insufficient information to code with codable children: Secondary | ICD-10-CM

## 2017-10-26 DIAGNOSIS — I639 Cerebral infarction, unspecified: Secondary | ICD-10-CM

## 2017-10-26 MED ORDER — INSULIN REGULAR HUMAN (CONC) 500 UNIT/ML ~~LOC~~ SOPN: 50.0000 [IU] | PEN_INJECTOR | Freq: Three times a day (TID) | SUBCUTANEOUS | 2 refills | Status: DC

## 2017-10-26 NOTE — Progress Notes (Signed)
  Medical Nutrition Therapy:  Appt start time: 930 end time:  1000  Assessment:  Primary concerns today: Diabetes Type 2. Here with his wife. He notes he had 3 mini  strokes recently and was in hospital. Diet continues to have processed high sodium foods-sausage. Taking 100 units of Levermi and Apredria on sliding scale with meals. Saw Dr. Dorris Fetch today and switching him over to U500 insuln for better control. Last a1c 8.7% ,down from 9.9%.. In August 2019.  Lab Results  Component Value Date   HGBA1C 9.9 (H) 07/12/2017    Has Stg 3 CKD. Has Elenor Legato and uses it. He notes his portions are still to high. Has been drinkingn sodas and sweet tea.  A1C went up from 9% to 9.7%.  CMP Latest Ref Rng & Units 08/01/2017 07/31/2017 07/31/2017  Glucose 65 - 99 mg/dL 216(H) 193(H) 197(H)  BUN 6 - 20 mg/dL 11 15 14   Creatinine 0.61 - 1.24 mg/dL 0.90 0.90 1.12  Sodium 135 - 145 mmol/L 141 143 141  Potassium 3.5 - 5.1 mmol/L 3.8 4.3 4.5  Chloride 101 - 111 mmol/L 105 104 105  CO2 22 - 32 mmol/L 25 - 26  Calcium 8.9 - 10.3 mg/dL 9.2 - 9.3  Total Protein 6.5 - 8.1 g/dL - - 7.9  Total Bilirubin 0.3 - 1.2 mg/dL - - 1.0  Alkaline Phos 38 - 126 U/L - - 72  AST 15 - 41 U/L - - 32  ALT 17 - 63 U/L - - 56    Lab Results  Component Value Date   HGBA1C 9.9 (H) 07/12/2017   Metformin 1000 mg BID.   physical actvity:  ADL and bowls a little.   Preferred Learning Style:   No preference indicated   Learning Readiness:  Ready  Change in progress   MEDICATIONS:   DIETARY INTAKE:  Trying to eat three meals per day. Still eating some processed meats of sausage.   Usual physical activity: ADL  Estimated energy needs: 1800  calories 200 g carbohydrates 135 g protein 50 g fat  Progress Towards Goal(s):  In progress.   Nutritional Diagnosis:  NB-1.1 Food and nutrition-related knowledge deficit As related to Diabetes.  As evidenced by A1C > 9%.    Intervention:  Nutrition and Diabetes education  provided on My Plate, CHO counting, meal planning, portion sizes. Low Salt Diet and High fiber foods.     Goals. 1. Follow Low Salt Diet 2. Use only Mrs. Dash 3. No processed meats of bacon, sausage, bologna. 4. Eat fresh fruits and vegetables and whole grains. Use Mrs. Dash only and other herbs and spices. Avoid snacks between meals. Walk daily for exercise.  Teaching Method Utilized: Visual Auditory Hands on  Handouts given during visit include:  The Plate Method  Meal Plan Card  Diabetes Instructions.   Barriers to learning/adherence to lifestyle change: none  Demonstrated degree of understanding via:  Teach Back   Monitoring/Evaluation:  Dietary intake, exercise, meal planning, SBG, and body weight in 3 month(s).

## 2017-10-26 NOTE — Progress Notes (Signed)
Endocrinology follow-up note  Subjective:    Patient ID: Alex Cortez, male    DOB: [birth year], PCP Burdine, Virgina Evener, MD   Past Medical History:  Diagnosis Date  . CAD (coronary artery disease)    nonobstructive CAD/patent LAD stent site; EF 65%, 08/2011; NSTEMI/DES LAD, 6/20 11  . Chest pain   . Diabetes mellitus    insulin-dependent  . Dyslipidemia   . HTN (hypertension)   . Post MI syndrome ([facility]) 08-29-2009  . Stroke [facility]) 10/22/2012   Left inferior pons   Past Surgical History:  Procedure Laterality Date  . CORONARY ANGIOPLASTY WITH STENT PLACEMENT     Social History   Socioeconomic History  . Marital status: Married    Spouse name: Not on file  . Number of children: Not on file  . Years of education: Not on file  . Highest education level: Not on file  Occupational History  . Not on file  Social Needs  . Financial resource strain: Not on file  . Food insecurity:    Worry: Not on file    Inability: Not on file  . Transportation needs:    Medical: Not on file    Non-medical: Not on file  Tobacco Use  . Smoking status: Former Smoker    Packs/day: 0.50    Years: 23.00    Pack years: 11.50    Types: Cigarettes    Start date: 03/16/1955    Last attempt to quit: 03/15/1988    Years since quitting: 29.6  . Smokeless tobacco: Never Used  Substance and Sexual Activity  . Alcohol use: No    Alcohol/week: 0.0 standard drinks  . Drug use: No  . Sexual activity: Not on file  Lifestyle  . Physical activity:    Days per week: Not on file    Minutes per session: Not on file  . Stress: Not on file  Relationships  . Social connections:    Talks on phone: Patient refused    Gets together: Patient refused    Attends religious service: Patient refused    Active member of club or organization: Patient refused    Attends meetings of clubs or organizations: Patient refused    Relationship status: Patient refused  Other Topics Concern  . Not on file  Social History  Narrative   Works at [employer]. Married.    Outpatient Encounter Medications as of 10/26/2017  Medication Sig  . aspirin 325 MG tablet Take 325 mg by mouth daily.  Marland Kitchen atorvastatin (LIPITOR) 80 MG tablet Take 1 tablet (80 mg total) by mouth every evening.  . benazepril (LOTENSIN) 40 MG tablet Take 0.5 tablets (20 mg total) by mouth daily.  . clopidogrel (PLAVIX) 75 MG tablet Take 1 tablet (75 mg total) by mouth daily.  . Continuous Blood Gluc Sensor (FREESTYLE LIBRE 14 DAY SENSOR) MISC 1 each by Does not apply route every 14 (fourteen) days.  . insulin regular human CONCENTRATED (HUMULIN R U-500 KWIKPEN) 500 UNIT/ML kwikpen Inject 50 Units into the skin 3 (three) times daily with meals.  . metFORMIN (GLUCOPHAGE) 1000 MG tablet Take 500 mg by mouth daily with breakfast.  . nitroGLYCERIN (NITROSTAT) 0.4 MG SL tablet Place 1 tablet (0.4 mg total) under the tongue every 5 (five) minutes as needed.  . ONE TOUCH ULTRA TEST test strip USE TO TEST BLOOD SUGAR FOUR TIMES DAILY E11.65  . [DISCONTINUED] APIDRA SOLOSTAR 100 UNIT/ML Solostar Pen INJECT 30-36 UNITS  SUBCUTANEOUSLY 3 TIMES A   DAY WITH MEALS  . [DISCONTINUED] Insulin Detemir (LEVEMIR FLEXTOUCH) 100 UNIT/ML Pen Inject 100 Units into the skin at bedtime.   No facility-administered encounter medications on file as of 10/26/2017.    ALLERGIES: Allergies  Allergen Reactions  . Shrimp [Shellfish Allergy]     Swelling and hives    VACCINATION STATUS:  There is no immunization history on file for this patient.  Diabetes  He presents for his follow-up diabetic visit. He has type 2 diabetes mellitus. Onset time: He was diagnosed at approximate age of 75 years. His disease course has been improving. There are no hypoglycemic associated symptoms. Pertinent negatives for hypoglycemia include no confusion, headaches, pallor or seizures. Pertinent negatives for diabetes include no chest pain, no fatigue, no polydipsia, no polyphagia, no  polyuria and no weakness. There are no hypoglycemic complications. Symptoms are worsening. Diabetic complications include a CVA and nephropathy. Risk factors for coronary artery disease include diabetes mellitus, dyslipidemia, hypertension, male sex, obesity and sedentary lifestyle. Current diabetic treatment includes intensive insulin program. He is compliant with treatment most of the time. His weight is stable. He is following a generally unhealthy (He consumes a large quantity of soda.) diet. When asked about meal planning, he reported none. He has had a previous visit with a dietitian. He never participates in exercise. His home blood glucose trend is increasing steadily. His breakfast blood glucose range is generally >200 mg/dl. His lunch blood glucose range is generally >200 mg/dl. His dinner blood glucose range is generally >200 mg/dl. His bedtime blood glucose range is generally >200 mg/dl. His overall blood glucose range is >200 mg/dl. An ACE inhibitor/angiotensin II receptor blocker is being taken. Eye exam is current.  Hyperlipidemia  This is a chronic problem. The current episode started more than 1 year ago. The problem is controlled. Exacerbating diseases include chronic renal disease, diabetes and obesity. Pertinent negatives include no chest pain, leg pain, myalgias or shortness of breath. Current antihyperlipidemic treatment includes statins. Risk factors for coronary artery disease include diabetes mellitus, dyslipidemia, hypertension, male sex, obesity and a sedentary lifestyle.  Hypertension  This is a chronic problem. The current episode started more than 1 year ago. The problem is controlled. Pertinent negatives include no chest pain, headaches, neck pain, palpitations or shortness of breath. Risk factors for coronary artery disease include obesity, male gender, dyslipidemia, diabetes mellitus, sedentary lifestyle and family history. Past treatments include ACE inhibitors. Hypertensive  end-organ damage includes kidney disease and CVA. Identifiable causes of hypertension include chronic renal disease.    Review of Systems  Constitutional: Negative for fatigue and unexpected weight change.  HENT: Negative for dental problem, mouth sores and trouble swallowing.   Eyes: Negative for visual disturbance.  Respiratory: Negative for cough, choking, chest tightness, shortness of breath and wheezing.   Cardiovascular: Negative for chest pain, palpitations and leg swelling.  Gastrointestinal: Negative for abdominal distention, abdominal pain, constipation, diarrhea, nausea and vomiting.  Endocrine: Negative for polydipsia, polyphagia and polyuria.  Genitourinary: Negative for dysuria, flank pain, hematuria and urgency.  Musculoskeletal: Negative for back pain, gait problem, myalgias and neck pain.  Skin: Negative for pallor, rash and wound.  Neurological: Negative for seizures, syncope, weakness, numbness and headaches.  Psychiatric/Behavioral: Negative.  Negative for confusion and dysphoric mood.    Objective:    BP (!) 165/82   Pulse 96   Ht 6\' 2"  (1.88 m)   Wt 261 lb (118.4 kg)   BMI 33.51 kg/m  Wt Readings from Last 3 Encounters:  10/26/17 261 lb (118.4 kg)  10/26/17 281 lb (127.5 kg)  08/31/17 264 lb (119.7 kg)    Physical Exam  Constitutional: He is oriented to person, place, and time. He appears well-developed. He is cooperative. No distress.  HENT:  Head: Normocephalic and atraumatic.  Eyes: EOM are normal.  Neck: Normal range of motion. Neck supple. No tracheal deviation present. No thyromegaly present.  Cardiovascular: Normal rate, S1 normal and S2 normal. Exam reveals no gallop.  No murmur heard. Pulses:      Dorsalis pedis pulses are 1+ on the right side, and 1+ on the left side.       Posterior tibial pulses are 1+ on the right side, and 1+ on the left side.  Pulmonary/Chest: Effort normal. No respiratory distress. He has no wheezes.  Abdominal: Bowel  sounds are normal. He exhibits no distension. There is no tenderness. There is no guarding and no CVA tenderness.     Musculoskeletal: He exhibits no edema.       Right shoulder: He exhibits no swelling and no deformity.  Neurological: He is alert and oriented to person, place, and time. He has normal strength and normal reflexes. No cranial nerve deficit or sensory deficit. Gait normal.  Skin: Skin is warm and dry. No rash noted. No cyanosis. Nails show no clubbing.  Psychiatric: He has a normal mood and affect. His speech is normal. Judgment normal. Cognition and memory are normal.    CMP Latest Ref Rng & Units 09/20/2017 08/01/2017 07/31/2017  Glucose 65 - 99 mg/dL - 216(H) 193(H)  BUN 4 - 21 14 11 15   Creatinine 0.6 - 1.3 1.1 0.90 0.90  Sodium 135 - 145 mmol/L - 141 143  Potassium 3.5 - 5.1 mmol/L - 3.8 4.3  Chloride 101 - 111 mmol/L - 105 104  CO2 22 - 32 mmol/L - 25 -  Calcium 8.9 - 10.3 mg/dL - 9.2 -  Total Protein 6.5 - 8.1 g/dL - - -  Total Bilirubin 0.3 - 1.2 mg/dL - - -  Alkaline Phos 38 - 126 U/L - - -  AST 15 - 41 U/L - - -  ALT 17 - 63 U/L - - -    Lipid Panel     Component Value Date/Time   CHOL 138 09/20/2017   TRIG 77 09/20/2017   HDL 37 09/20/2017   CHOLHDL 4.5 08/01/2017 0243   VLDL 20 08/01/2017 0243   LDLCALC 86 09/20/2017     Assessment & Plan:   1. Type 2 diabetes mellitus with vascular disease (HCC)  -His  diabetes is  complicated by recurrent CVA , obesity/sedentary life and patient remains at extremely  high risk for more acute and chronic complications of diabetes which include CAD, CVA, CKD, retinopathy, and neuropathy. These are all discussed in detail with the patient.  -Patient came with better glycemic profile wearing his CGM device.  Time in range is 58% improving from 24% last visit, above range 41% improving from 75% last visit.   -His previsit labs show A1c of 8.7% improving from 9.9%.    He  Still admits to dietary indiscretion including  consumption of large quantities of soda.     Glucose logs and insulin administration records pertaining to this visit,  to be scanned into patient's records.  Recent labs reviewed.  - I have re-counseled the patient on diet management and weight loss  by adopting a carbohydrate restricted / protein rich  Diet. - He is now open to proceed with a dietitian.  -  Suggestion is made for him to avoid simple carbohydrates  from his diet including Cakes, Sweet Desserts / Pastries, Ice Cream, Soda (diet and regular), Sweet Tea, Candies, Chips, Cookies, Store Bought Juices, Alcohol in Excess of  1-2 drinks a day, Artificial Sweeteners, and "Sugar-free" Products. This will help patient to have stable blood glucose profile and potentially avoid unintended weight gain.   - Patient is advised to stick to a routine mealtimes to eat 3 meals  a day and avoid unnecessary snacks ( to snack only to correct hypoglycemia).  - I have approached patient with the following individualized plan to manage diabetes and patient agrees.  -  Emphasizing the need for strict compliance, I advised him to continue Levemir to 100  units daily at bedtime, continue Apidra   30 units 3 times a day before meals for pre-meal blood glucose above 70 mg/dL, associated with documenting of blood glucose before meals and at bedtime.  -I will proceed to lower metformin to 500 mg p.o. once daily.   -He will be switched to insulin U500 on his next visit 30 days.  He will bring his leftover Levemir and Apidra supplies along with his new prescription for demonstration.  He is specifically warned not to start using that Humulin U500 until his next visit. 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  Patient is advised to call clinic if he registers blood glucose less than 70 mg/dL or greater than 300 mg/dL.  - He reports that he did not  qualify for bariatric surgery for Medicare coverage due to the fact that he is BMI is 33.  - Patient specific target  for A1c; LDL, HDL, Triglycerides, and  Waist Circumference were discussed in detail.  2) BP/HTN:  His blood pressure is not controlled to target . I advised him to continue follow-up with his neurologist, current medications including benazepril 40 mg p.o. twice daily.  3) Lipids/HPL: His lipid panel is controlled with LDL of 72.  He is advised to continue atorvastatin 80 mg p.o. nightly.   4)  Weight/Diet: Has no success for weight control mainly due to his dietary indiscretion.  He has consulted with CDE.   5) Chronic Care/Health Maintenance:  -Patient  is  on ACEI/ARB and Statin medications and encouraged to continue to follow up with Ophthalmology, urologist given his recent recurrent CVA, podiatrist at least yearly or according to recommendations, and advised to  stay away from smoking. I have recommended yearly flu vaccine and pneumonia vaccination at least every 5 years; moderate intensity exercise for up to 150 minutes weekly; and  sleep for at least 7 hours a day.  - I advised patient to maintain close follow up with Burdine, Virgina Evener, MD for primary care needs.  - Time spent with the patient: 25 min, of which >50% was spent in reviewing his blood glucose logs , discussing his hypo- and hyper-glycemic episodes, reviewing his current and  previous labs and insulin doses and developing a plan to avoid hypo- and hyper-glycemia. Please refer to Patient Instructions for Blood Glucose Monitoring and Insulin/Medications Dosing Guide"  in media tab for additional information. Marvene Staff Stegmaier participated in the discussions, expressed understanding, and voiced agreement with the above plans.  All questions were answered to his satisfaction. he is encouraged to contact clinic should  he have any questions or concerns prior to his return visit.  Follow up plan: -Return in about 4  weeks (around 11/23/2017) for Follow up with Meter and Logs Only - no Labs.  Glade Lloyd, MD Phone: [phone]  Fax: [phone]  -  This note was partially dictated with voice recognition software. Similar sounding words can be transcribed inadequately or may not  be corrected upon review.  10/26/2017, 12:17 PM

## 2017-10-26 NOTE — Patient Instructions (Signed)

## 2017-10-26 NOTE — Patient Instructions (Signed)
Goals. 1. Follow Low Salt Diet 2. Use only Mrs. Dash 3. No processed meats of bacon, sausage, bologna. 4. Eat fresh fruits and vegetables and whole grains. Use Mrs. Dash only and other herbs and spices. Avoid snacks between meals. Walk daily for exercise.

## 2017-10-31 ENCOUNTER — Other Ambulatory Visit: Payer: Self-pay

## 2017-10-31 MED ORDER — INSULIN REGULAR HUMAN (CONC) 500 UNIT/ML ~~LOC~~ SOPN: 50.0000 [IU] | PEN_INJECTOR | Freq: Three times a day (TID) | SUBCUTANEOUS | 2 refills | Status: DC

## 2017-11-01 ENCOUNTER — Telehealth: Payer: Self-pay | Admitting: Adult Health

## 2017-11-01 ENCOUNTER — Other Ambulatory Visit: Payer: Self-pay

## 2017-11-01 MED ORDER — CLOPIDOGREL BISULFATE 75 MG PO TABS: 75.0000 mg | ORAL_TABLET | Freq: Every day | ORAL | 0 refills | Status: DC

## 2017-11-01 NOTE — Telephone Encounter (Signed)
Rn call patient that per Janett Billow NP she can provide him with 30 days supply of plavix. Pt stated he has seen his PCP after his hospital discharge, but did not know to ask for refills. PT will be seeing his PCP in 12/2017 for a visit. Rn advised pt to call his PCP 10 days before it runs out to get refills. Rn stated a 30 day refill will be sent to his pharmacy. PT verbalized understanding to follow up with PCP.

## 2017-11-01 NOTE — Telephone Encounter (Signed)
Patient requesting refill of  clopidogrel (PLAVIX) 75 MG tablet sent to [facility] on [address] in [place].

## 2017-11-18 DIAGNOSIS — N3289 Other specified disorders of bladder: Secondary | ICD-10-CM | POA: Diagnosis not present

## 2017-11-18 DIAGNOSIS — R52 Pain, unspecified: Secondary | ICD-10-CM | POA: Diagnosis not present

## 2017-11-18 DIAGNOSIS — S6981XA Other specified injuries of right wrist, hand and finger(s), initial encounter: Secondary | ICD-10-CM | POA: Diagnosis not present

## 2017-11-20 ENCOUNTER — Other Ambulatory Visit: Payer: Self-pay | Admitting: Adult Health

## 2017-11-24 DIAGNOSIS — S6981XA Other specified injuries of right wrist, hand and finger(s), initial encounter: Secondary | ICD-10-CM | POA: Diagnosis not present

## 2017-11-24 DIAGNOSIS — M25531 Pain in right wrist: Secondary | ICD-10-CM | POA: Diagnosis not present

## 2017-11-24 DIAGNOSIS — M19031 Primary osteoarthritis, right wrist: Secondary | ICD-10-CM | POA: Diagnosis not present

## 2017-11-28 ENCOUNTER — Other Ambulatory Visit: Payer: Self-pay

## 2017-11-28 MED ORDER — CLOPIDOGREL BISULFATE 75 MG PO TABS: 75.0000 mg | ORAL_TABLET | Freq: Every day | ORAL | 5 refills | Status: DC

## 2017-11-30 ENCOUNTER — Encounter: Payer: Self-pay | Admitting: "Endocrinology

## 2017-11-30 ENCOUNTER — Encounter: Payer: Self-pay | Admitting: Nutrition

## 2017-11-30 ENCOUNTER — Ambulatory Visit (INDEPENDENT_AMBULATORY_CARE_PROVIDER_SITE_OTHER): Payer: Medicare Other | Admitting: "Endocrinology

## 2017-11-30 ENCOUNTER — Encounter: Payer: Medicare Other | Attending: Family Medicine | Admitting: Nutrition

## 2017-11-30 VITALS — BP 137/82 | HR 96 | Ht 74.0 in | Wt 261.0 lb

## 2017-11-30 VITALS — Ht 74.0 in | Wt 261.0 lb

## 2017-11-30 DIAGNOSIS — N3289 Other specified disorders of bladder: Secondary | ICD-10-CM | POA: Diagnosis not present

## 2017-11-30 DIAGNOSIS — E1165 Type 2 diabetes mellitus with hyperglycemia: Secondary | ICD-10-CM

## 2017-11-30 DIAGNOSIS — Z6832 Body mass index (BMI) 32.0-32.9, adult: Secondary | ICD-10-CM | POA: Diagnosis not present

## 2017-11-30 DIAGNOSIS — IMO0002 Reserved for concepts with insufficient information to code with codable children: Secondary | ICD-10-CM

## 2017-11-30 DIAGNOSIS — I10 Essential (primary) hypertension: Secondary | ICD-10-CM

## 2017-11-30 DIAGNOSIS — E1159 Type 2 diabetes mellitus with other circulatory complications: Secondary | ICD-10-CM | POA: Insufficient documentation

## 2017-11-30 DIAGNOSIS — E782 Mixed hyperlipidemia: Secondary | ICD-10-CM

## 2017-11-30 DIAGNOSIS — I63412 Cerebral infarction due to embolism of left middle cerebral artery: Secondary | ICD-10-CM | POA: Diagnosis not present

## 2017-11-30 DIAGNOSIS — Z713 Dietary counseling and surveillance: Secondary | ICD-10-CM | POA: Diagnosis not present

## 2017-11-30 DIAGNOSIS — E118 Type 2 diabetes mellitus with unspecified complications: Secondary | ICD-10-CM

## 2017-11-30 DIAGNOSIS — E6609 Other obesity due to excess calories: Secondary | ICD-10-CM

## 2017-11-30 DIAGNOSIS — E669 Obesity, unspecified: Secondary | ICD-10-CM

## 2017-11-30 DIAGNOSIS — S63591D Other specified sprain of right wrist, subsequent encounter: Secondary | ICD-10-CM | POA: Diagnosis not present

## 2017-11-30 NOTE — Patient Instructions (Signed)

## 2017-11-30 NOTE — Patient Instructions (Addendum)
Goals Take 50 units of U500 30 minutes before meals Cut out snacks and processed foods between meals Drink only water. Walk 30 minutes a day Don;t eat past 7 pm Eat more  foods  That come out of garden.

## 2017-11-30 NOTE — Progress Notes (Signed)
Endocrinology follow-up note  Subjective:    Patient ID: Alex Cortez, male    DOB: 10/21/1950, PCP Burdine, Virgina Evener, MD   Past Medical History:  Diagnosis Date  . CAD (coronary artery disease)    nonobstructive CAD/patent LAD stent site; EF 65%, 08/2011; NSTEMI/DES LAD, 6/20 11  . Chest pain   . Diabetes mellitus    insulin-dependent  . Dyslipidemia   . HTN (hypertension)   . Post MI syndrome ([facility]) 08-29-2009  . Stroke [facility]) 10/22/2012   Left inferior pons   Past Surgical History:  Procedure Laterality Date  . CORONARY ANGIOPLASTY WITH STENT PLACEMENT     Social History   Socioeconomic History  . Marital status: Married    Spouse name: Not on file  . Number of children: Not on file  . Years of education: Not on file  . Highest education level: Not on file  Occupational History  . Not on file  Social Needs  . Financial resource strain: Not on file  . Food insecurity:    Worry: Not on file    Inability: Not on file  . Transportation needs:    Medical: Not on file    Non-medical: Not on file  Tobacco Use  . Smoking status: Former Smoker    Packs/day: 0.50    Years: 23.00    Pack years: 11.50    Types: Cigarettes    Start date: 03/16/1955    Last attempt to quit: 03/15/1988    Years since quitting: 29.7  . Smokeless tobacco: Never Used  Substance and Sexual Activity  . Alcohol use: No    Alcohol/week: 0.0 standard drinks  . Drug use: No  . Sexual activity: Not on file  Lifestyle  . Physical activity:    Days per week: Not on file    Minutes per session: Not on file  . Stress: Not on file  Relationships  . Social connections:    Talks on phone: Patient refused    Gets together: Patient refused    Attends religious service: Patient refused    Active member of club or organization: Patient refused    Attends meetings of clubs or organizations: Patient refused    Relationship status: Patient refused  Other Topics Concern  . Not on file  Social History  Narrative   Works at [employer]. Married.    Outpatient Encounter Medications as of 11/30/2017  Medication Sig  . aspirin 325 MG tablet Take 325 mg by mouth daily.  Marland Kitchen atorvastatin (LIPITOR) 80 MG tablet Take 1 tablet (80 mg total) by mouth every evening.  . benazepril (LOTENSIN) 40 MG tablet Take 0.5 tablets (20 mg total) by mouth daily.  . clopidogrel (PLAVIX) 75 MG tablet Take 1 tablet (75 mg total) by mouth daily.  . Continuous Blood Gluc Sensor (FREESTYLE LIBRE 14 DAY SENSOR) MISC 1 each by Does not apply route every 14 (fourteen) days.  . insulin regular human CONCENTRATED (HUMULIN R U-500 KWIKPEN) 500 UNIT/ML kwikpen Inject 50 Units into the skin 3 (three) times daily with meals.  . metFORMIN (GLUCOPHAGE) 1000 MG tablet Take 500 mg by mouth daily with breakfast.  . nitroGLYCERIN (NITROSTAT) 0.4 MG SL tablet Place 1 tablet (0.4 mg total) under the tongue every 5 (five) minutes as needed.  . ONE TOUCH ULTRA TEST test strip USE TO TEST BLOOD SUGAR FOUR TIMES DAILY E11.65   No facility-administered encounter medications on file as of 11/30/2017.    ALLERGIES: Allergies  Allergen Reactions  .  Shrimp [Shellfish Allergy]     Swelling and hives    VACCINATION STATUS:  There is no immunization history on file for this patient.  Diabetes  He presents for his follow-up diabetic visit. He has type 2 diabetes mellitus. Onset time: He was diagnosed at approximate age of 78 years. His disease course has been worsening. There are no hypoglycemic associated symptoms. Pertinent negatives for hypoglycemia include no confusion, headaches, pallor or seizures. Pertinent negatives for diabetes include no chest pain, no fatigue, no polydipsia, no polyphagia, no polyuria and no weakness. There are no hypoglycemic complications. Symptoms are worsening. Diabetic complications include a CVA and nephropathy. Risk factors for coronary artery disease include diabetes mellitus, dyslipidemia, hypertension, male sex,  obesity and sedentary lifestyle. Current diabetic treatment includes intensive insulin program. He is compliant with treatment most of the time. His weight is stable. He is following a generally unhealthy (He consumes a large quantity of soda.) diet. When asked about meal planning, he reported none. He has had a previous visit with a dietitian. He never participates in exercise. His home blood glucose trend is increasing steadily. His bedtime blood glucose range is generally >200 mg/dl. His overall blood glucose range is >200 mg/dl. (He did not keep his CGM.  His meter shows average of 309 over the last 14 days of 15 readings.) An ACE inhibitor/angiotensin II receptor blocker is being taken. Eye exam is current.  Hyperlipidemia  This is a chronic problem. The current episode started more than 1 year ago. The problem is controlled. Exacerbating diseases include chronic renal disease, diabetes and obesity. Pertinent negatives include no chest pain, leg pain, myalgias or shortness of breath. Current antihyperlipidemic treatment includes statins. Risk factors for coronary artery disease include diabetes mellitus, dyslipidemia, hypertension, male sex, obesity and a sedentary lifestyle.  Hypertension  This is a chronic problem. The current episode started more than 1 year ago. The problem is controlled. Pertinent negatives include no chest pain, headaches, neck pain, palpitations or shortness of breath. Risk factors for coronary artery disease include obesity, male gender, dyslipidemia, diabetes mellitus, sedentary lifestyle and family history. Past treatments include ACE inhibitors. Hypertensive end-organ damage includes kidney disease and CVA. Identifiable causes of hypertension include chronic renal disease.    Review of Systems  Constitutional: Negative for fatigue and unexpected weight change.  HENT: Negative for dental problem, mouth sores and trouble swallowing.   Eyes: Negative for visual disturbance.   Respiratory: Negative for cough, choking, chest tightness, shortness of breath and wheezing.   Cardiovascular: Negative for chest pain, palpitations and leg swelling.  Gastrointestinal: Negative for abdominal distention, abdominal pain, constipation, diarrhea, nausea and vomiting.  Endocrine: Negative for polydipsia, polyphagia and polyuria.  Genitourinary: Negative for dysuria, flank pain, hematuria and urgency.  Musculoskeletal: Negative for back pain, gait problem, myalgias and neck pain.  Skin: Negative for pallor, rash and wound.  Neurological: Negative for seizures, syncope, weakness, numbness and headaches.  Psychiatric/Behavioral: Negative.  Negative for confusion and dysphoric mood.    Objective:    BP 137/82   Pulse 96   Ht 6\' 2"  (1.88 m)   Wt 261 lb (118.4 kg)   BMI 33.51 kg/m   Wt Readings from Last 3 Encounters:  11/30/17 261 lb (118.4 kg)  11/30/17 261 lb (118.4 kg)  10/26/17 261 lb (118.4 kg)    Physical Exam  Constitutional: He is oriented to person, place, and time. He appears well-developed. He is cooperative. No distress.  HENT:  Head: Normocephalic and atraumatic.  Eyes: EOM are normal.  Neck: Normal range of motion. Neck supple. No tracheal deviation present. No thyromegaly present.  Cardiovascular: Normal rate, S1 normal and S2 normal. Exam reveals no gallop.  No murmur heard. Pulses:      Dorsalis pedis pulses are 1+ on the right side, and 1+ on the left side.       Posterior tibial pulses are 1+ on the right side, and 1+ on the left side.  Pulmonary/Chest: Effort normal. No respiratory distress. He has no wheezes.  Abdominal: He exhibits no distension. There is no tenderness. There is no guarding and no CVA tenderness.  Has diffuse lipodystrophy on abdominal skin.  Musculoskeletal: He exhibits no edema.       Right shoulder: He exhibits no swelling and no deformity.  Neurological: He is alert and oriented to person, place, and time. He has normal  strength. No cranial nerve deficit or sensory deficit. Gait normal.  Skin: Skin is warm and dry. No rash noted. No cyanosis. Nails show no clubbing.  Psychiatric: He has a normal mood and affect. His speech is normal. Judgment normal. Cognition and memory are normal.    CMP Latest Ref Rng & Units 09/20/2017 08/01/2017 07/31/2017  Glucose 65 - 99 mg/dL - 216(H) 193(H)  BUN 4 - 21 14 11 15   Creatinine 0.6 - 1.3 1.1 0.90 0.90  Sodium 135 - 145 mmol/L - 141 143  Potassium 3.5 - 5.1 mmol/L - 3.8 4.3  Chloride 101 - 111 mmol/L - 105 104  CO2 22 - 32 mmol/L - 25 -  Calcium 8.9 - 10.3 mg/dL - 9.2 -  Total Protein 6.5 - 8.1 g/dL - - -  Total Bilirubin 0.3 - 1.2 mg/dL - - -  Alkaline Phos 38 - 126 U/L - - -  AST 15 - 41 U/L - - -  ALT 17 - 63 U/L - - -    Lipid Panel     Component Value Date/Time   CHOL 138 09/20/2017   TRIG 77 09/20/2017   HDL 37 09/20/2017   CHOLHDL 4.5 08/01/2017 0243   VLDL 20 08/01/2017 0243   LDLCALC 86 09/20/2017   Results for MIR, FULLILOVE (MRN [id number]) as of 11/30/2017 12:26  Ref. Range 09/22/2016 07:49 01/05/2017 08:06 03/18/2017 00:00 07/12/2017 11:14 09/20/2017 00:00  Hemoglobin A1C Latest Ref Range: 4.0 - 6.0  9.0 (H) 9.0 (H) 9.7 9.9 (H) 8.7 (A)    Assessment & Plan:   1. Type 2 diabetes mellitus with vascular disease (HCC)  -His  diabetes is  complicated by recurrent CVA , obesity/sedentary life and patient remains at extremely  high risk for more acute and chronic complications of diabetes which include CAD, CVA, CKD, retinopathy, and neuropathy. These are all discussed in detail with the patient.  -Patient comes without his logs and did not continue to use his CGM.   -His meter shows average of 309 for the last 14 days, however monitored only 15 times.    -Prior to his last visit, A1c was 8.7% , improving from 9.9%.     - Glucose logs and insulin administration records pertaining to this visit,  to be scanned into patient's records.  Recent labs  reviewed.  - I have re-counseled the patient on diet management and weight loss  by adopting a carbohydrate restricted / protein rich  Diet.  - He  still admits to dietary indiscretion including consumption of large quantities of soda.    -  Suggestion is  made for him to avoid simple carbohydrates  from his diet including Cakes, Sweet Desserts / Pastries, Ice Cream, Soda (diet and regular), Sweet Tea, Candies, Chips, Cookies, Store Bought Juices, Alcohol in Excess of  1-2 drinks a day, Artificial Sweeteners, and "Sugar-free" Products. This will help patient to have stable blood glucose profile and potentially avoid unintended weight gain.  - Patient is advised to stick to a routine mealtimes to eat 3 meals  a day and avoid unnecessary snacks ( to snack only to correct hypoglycemia).  - I have approached patient with the following individualized plan to manage diabetes and patient agrees.  -He is currently requiring more than 200 units of U 100 insulin and still not on target averaging 309.   -He will be switched to insulin Humulin U500 for better absorption, given the fact that he has significant lipodystrophy on abdominal skin.   -I discussed and demonstrated Humulin U500 in the exam room and advised him to start 50 units 3 times daily AC for pre-meal blood glucose readings of 90 mg/dL or above.  -I have advised him on how he can rotate insulin injection sites on his abdominal skin.  -He is warned not not to take insulin without proper monitoring of blood glucose.  -He has difficulty keeping his CGM sensor on his skin, advised to use mechanisms including Tegaderm or wrap around his arm to keep the sensor on his skin.  Patient is advised to call clinic if he registers blood glucose less than 70 mg/dL or greater than 200 mg/dL. -He will continue to use metformin 1000 mg daily at breakfast.  - He reports that he did not qualify for bariatric surgery for Medicare coverage due to the fact that  he is BMI is 33.  - Patient specific target  for A1c; LDL, HDL, Triglycerides, and  Waist Circumference were discussed in detail.  2) BP/HTN:  His blood pressure is not controlled to target . I advised him to continue follow-up with his neurologist, current medications including benazepril 40 mg p.o. twice daily.  3) Lipids/HPL: His lipid panel is controlled with LDL of 72.  He is advised to continue atorvastatin 80 mg p.o. nightly.  Side effects and precautions discussed with him.   4)  Weight/Diet: Has no success for weight control mainly due to his dietary indiscretion.  He has consulted with CDE.   5) Chronic Care/Health Maintenance:  -Patient  is  on ACEI/ARB and Statin medications and encouraged to continue to follow up with Ophthalmology, urologist given his recent recurrent CVA, podiatrist at least yearly or according to recommendations, and advised to  stay away from smoking. I have recommended yearly flu vaccine and pneumonia vaccination at least every 5 years; moderate intensity exercise for up to 150 minutes weekly; and  sleep for at least 7 hours a day.  - I advised patient to maintain close follow up with Burdine, Virgina Evener, MD for primary care needs.  - Time spent with the patient: 25 min, of which >50% was spent in reviewing his blood glucose logs , discussing his hypo- and hyper-glycemic episodes, reviewing his current and  previous labs and insulin doses and developing a plan to avoid hypo- and hyper-glycemia. Please refer to Patient Instructions for Blood Glucose Monitoring and Insulin/Medications Dosing Guide"  in media tab for additional information. Marvene Staff Sieben participated in the discussions, expressed understanding, and voiced agreement with the above plans.  All questions were answered to his satisfaction. he is encouraged to  contact clinic should he have any questions or concerns prior to his return visit.   Follow up plan: -Return in about 5 weeks (around  01/04/2018) for Meter, and Logs.  Glade Lloyd, MD Phone: [phone]  Fax: [phone]  -  This note was partially dictated with voice recognition software. Similar sounding words can be transcribed inadequately or may not  be corrected upon review.  11/30/2017, 12:24 PM

## 2017-11-30 NOTE — Progress Notes (Signed)
  Medical Nutrition Therapy:  Appt start time: 930 end time:  1000  Assessment:  Primary concerns today: Diabetes Type 2. Here with his wife. H/o CVA x 2. Diet continues to have processed high sodium foods-pizza and nabs and doritos. He tends to snack between meals.  Saw Dr. Dorris Fetch today and started on u500 50 units before meals TID. ON libre. Avg BS 309 mg/dl. BS poorly controlled. No exercising. Needs more work on his diet and exercise. Vitals with BMI 11/30/2017  Height 6\' 2"   Weight 261 lbs  BMI 70.7  Systolic 867  Diastolic 82  Pulse 96  Respirations     Lab Results  Component Value Date   HGBA1C 8.7 (A) 09/20/2017     CMP Latest Ref Rng & Units 09/20/2017 08/01/2017 07/31/2017  Glucose 65 - 99 mg/dL - 216(H) 193(H)  BUN 4 - 21 14 11 15   Creatinine 0.6 - 1.3 1.1 0.90 0.90  Sodium 135 - 145 mmol/L - 141 143  Potassium 3.5 - 5.1 mmol/L - 3.8 4.3  Chloride 101 - 111 mmol/L - 105 104  CO2 22 - 32 mmol/L - 25 -  Calcium 8.9 - 10.3 mg/dL - 9.2 -  Total Protein 6.5 - 8.1 g/dL - - -  Total Bilirubin 0.3 - 1.2 mg/dL - - -  Alkaline Phos 38 - 126 U/L - - -  AST 15 - 41 U/L - - -  ALT 17 - 63 U/L - - -    Lab Results  Component Value Date   HGBA1C 8.7 (A) 09/20/2017   Metformin 1000 mg BID.   physical actvity:  ADL and bowls a little.   Preferred Learning Style:   No preference indicated   Learning Readiness:  Ready  Change in progress   MEDICATIONS:   DIETARY INTAKE:  B) egg sandwich, [place], 1 c milk and tomatoes, water L Apple and cukes/onions and salad, water D) 4 slices pizza Snacks doritos or nabs. water  Usual physical activity: ADL  Estimated energy needs: 1800  calories 200 g carbohydrates 135 g protein 50 g fat  Progress Towards Goal(s):  In progress.   Nutritional Diagnosis:  NB-1.1 Food and nutrition-related knowledge deficit As related to Diabetes.  As evidenced by A1C > 9%.    Intervention:  Nutrition and Diabetes education provided  on My Plate, CHO counting, meal planning, portion sizes. Low Salt Diet and High fiber foods.    Goals Take 50 units of U500 30 minutes before meals Cut out snacks and processed foods between meals Drink only water. Walk 30 minutes a day Don;t eat past 7 pm Eat more  foods  That come out of garden.   Teaching Method Utilized: Visual Auditory Hands on  Handouts given during visit include:  The Plate Method  Meal Plan Card  Diabetes Instructions.   Barriers to learning/adherence to lifestyle change: none  Demonstrated degree of understanding via:  Teach Back   Monitoring/Evaluation:  Dietary intake, exercise, meal planning, SBG, and body weight in 3 month(s).

## 2017-12-01 ENCOUNTER — Telehealth: Payer: Self-pay | Admitting: Adult Health

## 2017-12-01 ENCOUNTER — Other Ambulatory Visit: Payer: Self-pay | Admitting: Orthopedic Surgery

## 2017-12-01 ENCOUNTER — Ambulatory Visit (INDEPENDENT_AMBULATORY_CARE_PROVIDER_SITE_OTHER): Payer: Medicare Other | Admitting: Adult Health

## 2017-12-01 ENCOUNTER — Telehealth: Payer: Self-pay | Admitting: *Deleted

## 2017-12-01 ENCOUNTER — Encounter: Payer: Self-pay | Admitting: Adult Health

## 2017-12-01 VITALS — BP 138/69 | HR 94 | Wt 260.2 lb

## 2017-12-01 DIAGNOSIS — I63512 Cerebral infarction due to unspecified occlusion or stenosis of left middle cerebral artery: Secondary | ICD-10-CM | POA: Diagnosis not present

## 2017-12-01 DIAGNOSIS — E1169 Type 2 diabetes mellitus with other specified complication: Secondary | ICD-10-CM | POA: Diagnosis not present

## 2017-12-01 DIAGNOSIS — E7800 Pure hypercholesterolemia, unspecified: Secondary | ICD-10-CM

## 2017-12-01 DIAGNOSIS — I10 Essential (primary) hypertension: Secondary | ICD-10-CM

## 2017-12-01 DIAGNOSIS — I639 Cerebral infarction, unspecified: Secondary | ICD-10-CM

## 2017-12-01 DIAGNOSIS — E669 Obesity, unspecified: Secondary | ICD-10-CM

## 2017-12-01 MED ORDER — PROPRANOLOL HCL 40 MG PO TABS: 20.0000 mg | ORAL_TABLET | Freq: Two times a day (BID) | ORAL | 3 refills | Status: DC

## 2017-12-01 NOTE — Telephone Encounter (Signed)
Called and spoke to [place] at Dr. Bronson Ing at [place] office she is Mailing out Monitor today . And scheduled patient for a follow up Dec. 2 nd  at 3:00 pm.  There office will call patient. To  Schedule . Thanks Hinton Dyer

## 2017-12-01 NOTE — Progress Notes (Signed)
[facility] [address] [place]. [place] [address] [phone]       OFFICE FOLLOW UP NOTE  [name] Date of Birth:  03/13/1951 Medical Record Number:  [id number]   Reason for Referral:  hospital stroke follow up  CHIEF COMPLAINT:  Chief Complaint  Patient presents with  . Follow-up    CVA follow up room 9 pt with wife Alex Cortez     HPI: Alex Cortez is being seen today in the office for left watershed infarcts with new left putamen into corona radiata infarct on 07/31/17. History obtained from patient and chart review. Reviewed all radiology images and labs personally.  [name] is a 67 y.o. male with history of CAD, HTN, HLD, DM, L pontine stroke who presented with R arm weakness.  CT head reviewed and showed no acute pathology.  CTA head and neck showed no emergent large vessel occlusion but did show moderate left M1 stenosis, bilateral cavernous ICA 60% stenosis, left frontal meningioma, left nasal/nasopharynx polyp and patching bilateral sinusitis.  MRI reviewed and showed scattered cortical and subcortical left watershed infarcts, left frontal meningioma, multiple small old infarct and chronic sinusitis.  2D echo showed an EF of 55 to 60% without source of embolus.  Lower extremity Doppler negative.  Repeat MRI on 08/02/2017 showed new lateral carcinoma extending into CR infarct.  LDL 87 and recommended to continue Lipitor 80 mg.  A1c uncontrolled at 9.9 and recommended close PCP follow-up.  Recommended DAPT for 3 months and then Plavix alone.  During hospitalization, all infarcts were felt to be thrombotic secondary to left M1 large vessel disease stenosis but unable to rule out atrial fibrillation and no A. fib was documented on time monitor during hospitalization ; recommended 30-day cardiac monitor as outpatient to rule out A. fib.  PT/OT recommended outpatient PT/OT/ST.  Patient was discharged in stable condition.  Patient is being seen  today for hospital follow-up and is accompanied by his wife.  He continues to have very mild right hemiparesis this has been improving as he has been going to PT/OT in [place].  He did undergo ST for approximately 2 weeks and then was told he no longer needs speech therapy.  Blood pressure elevated at today's appointment 171/92 but this is monitored at home and typically SBP 140s.    He continues to take aspirin and Plavix without side effects of bleeding or bruising.  Continues to take Lipitor without side effects myalgias. He has returned to most previous activities but he has not returned to work or driving at this time.  Patient is a driver for a car company.  Denies new or worsening stroke/TIA symptoms.  Interval history 12/02/2017: Patient returns today for scheduled follow-up appointment.  Patient called office on 09/28/2017 (see phone notes) with complaints of right hand pain after PT session.  PCP referred him to [place] and was found to have right hand dystrophic calcification of the radial collateral side ligaments of his middle finger with an old fracture of the distal phalanx.  Per epic, patient will be undergoing arthroscopy of right wrist with debridement and possible shrinkage on 12/13/2017.  Patient also follows with endocrinology for continued management of DM with most recent A1c 8.7 (improvement from prior A1c at 9.9).  Patient states overall from a stroke standpoint, he has been doing well with main complaint of right hand pain.  He also has complaints of action tremors that have been present since his  stroke with only mild improvement.  He does state these are debilitating as he is unable to hold anything in his right hand such as a cell phone, unable to hold silverware or write as patient is right-handed.  No tremors present at rest.  He does state that when he puts pressure on his wrist the tremor subsides slightly so that he is able to function with his right hand.  This  does cause some increased pain to already injured right hand.  He does continue to take Plavix without side effects of bleeding or bruising.  Continues to take Lipitor without side effects myalgias.  Does monitor glucose levels at home and have been stable.  Blood pressure today satisfactory 138/69 and per wife, this reading is comparable to his home readings.  30-day cardiac monitor order was placed at prior visit patient states he was never called by his cardiology office therefore monitoring was not completed.  Denies new or worsening stroke/TIA symptoms.   ROS:   14 system review of systems performed and negative with exception of pain and tremors  PMH:  Past Medical History:  Diagnosis Date  . CAD (coronary artery disease)    nonobstructive CAD/patent LAD stent site; EF 65%, 08/2011; NSTEMI/DES LAD, 6/20 11  . Chest pain   . Diabetes mellitus    insulin-dependent  . Dyslipidemia   . HTN (hypertension)   . 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 Stroke [facility]) 10/22/2012   Left inferior pons    PSH:  Past Surgical History:  Procedure Laterality Date  . CORONARY ANGIOPLASTY WITH STENT PLACEMENT      Social History:  Social History   Socioeconomic History  . Marital status: Married    Spouse name: Not on file  . Number of children: Not on file  . Years of education: Not on file  . Highest education level: Not on file  Occupational History  . Not on file  Social Needs  . Financial resource strain: Not on file  . Food insecurity:    Worry: Not on file    Inability: Not on file  . Transportation needs:    Medical: Not on file    Non-medical: Not on file  Tobacco Use  . Smoking status: Former Smoker    Packs/day: 0.50    Years: 23.00    Pack years: 11.50    Types: Cigarettes    Start date: 03/16/1955    Last attempt to quit: 03/15/1988    Years since quitting: 29.7  . Smokeless tobacco: Never Used  Substance and Sexual Activity  . Alcohol use: No    Alcohol/week: 0.0  standard drinks  . Drug use: No  . Sexual activity: Not on file  Lifestyle  . Physical activity:    Days per week: Not on file    Minutes per session: Not on file  . Stress: Not on file  Relationships  . Social connections:    Talks on phone: Patient refused    Gets together: Patient refused    Attends religious service: Patient refused    Active member of club or organization: Patient refused    Attends meetings of clubs or organizations: Patient refused    Relationship status: Patient refused  . Intimate partner violence:    Fear of current or ex partner: Patient refused    Emotionally abused: Patient refused    Physically abused: Patient refused    Forced sexual activity: Patient refused  Other Topics Concern  . Not  on file  Social History Narrative   Works at [employer]. Married.     Family History:  Family History  Problem Relation Age of Onset  . Hypertension Other        in all family members    Medications:   Current Outpatient Medications on File Prior to Visit  Medication Sig Dispense Refill  . atorvastatin (LIPITOR) 80 MG tablet Take 1 tablet (80 mg total) by mouth every evening. 90 tablet 3  . benazepril (LOTENSIN) 40 MG tablet Take 0.5 tablets (20 mg total) by mouth daily. 30 tablet 0  . clopidogrel (PLAVIX) 75 MG tablet Take 1 tablet (75 mg total) by mouth daily. 30 tablet 5  . Continuous Blood Gluc Sensor (FREESTYLE LIBRE 14 DAY SENSOR) MISC 1 each by Does not apply route every 14 (fourteen) days. 7 each 2  . insulin regular human CONCENTRATED (HUMULIN R U-500 KWIKPEN) 500 UNIT/ML kwikpen Inject 50 Units into the skin 3 (three) times daily with meals. 9 mL 2  . metFORMIN (GLUCOPHAGE) 1000 MG tablet Take 500 mg by mouth daily with breakfast.  3  . nitroGLYCERIN (NITROSTAT) 0.4 MG SL tablet Place 1 tablet (0.4 mg total) under the tongue every 5 (five) minutes as needed. 25 tablet 3  . ONE TOUCH ULTRA TEST test strip USE TO TEST BLOOD SUGAR FOUR TIMES DAILY  E11.65 150 each 5   No current facility-administered medications on file prior to visit.     Allergies:   Allergies  Allergen Reactions  . Shrimp [Shellfish Allergy]     Swelling and hives      Physical Exam  Vitals:   12/01/17 0922  BP: 138/69  Pulse: 94  Weight: 260 lb 3.2 oz (118 kg)   Body mass index is 33.41 kg/m. No exam data present  General: well developed, well nourished, pleasant African-American male, seated, in no evident distress Head: head normocephalic and atraumatic.   Neck: supple with no carotid or supraclavicular bruits Cardiovascular: regular rate and rhythm, no murmurs Musculoskeletal: no deformity Skin:  no rash/petichiae Vascular:  Normal pulses all extremities  Neurologic Exam Mental Status: Awake and fully alert. Oriented to place and time. Recent and remote memory intact. Attention span, concentration and fund of knowledge appropriate. Mood and affect appropriate.  Cranial Nerves: Fundoscopic exam reveals sharp disc margins. Pupils equal, briskly reactive to light. Extraocular movements full without nystagmus. Visual fields full to confrontation. Hearing intact. Facial sensation intact. Face, tongue, palate moves normally and symmetrically.  Motor: Normal bulk and tone.  Normal strength tested in all extremities.  Unable to test right grip strength as patient is unable to make a fist due to swelling and pain.  Finger strength was noted to be equal strength compared to left hand. Sensory.: intact to touch , pinprick , position and vibratory sensation.  Coordination: Rapid alternating movements normal in all extremities. Finger-to-nose and heel-to-shin performed accurately bilaterally.  Decreased right finger dexterity; orbits left normal for right arm.  Action tremor noted in right distal upper extremity.  Negative for resting tremor. Gait and Station: Arises from chair without difficulty. Stance is normal. Gait demonstrates normal stride length and  balance . Able to heel, toe and tandem walk without difficulty.  Reflexes: 1+ and symmetric. Toes downgoing.      Diagnostic Data (Labs, Imaging, Testing)  CT HEAD WO CONTRAST 07/31/2017 IMPRESSION: 1. No acute intracranial pathology seen on CT. 2. Mild cortical volume loss and scattered small vessel ischemic microangiopathy. 3. Opacification of  the right side of the sphenoid sinus and partial opacification of the right frontal sinus.  CT ANGIO HEAD W OR WO CONTRAST CT ANGIO NECK W OR WO CONTRAST 07/31/2017 IMPRESSION: 1. Negative for emergent large vessel occlusion. 2. Moderate to advanced left M1 segment stenosis due to eccentric filling defect, possibly symptomatic in this setting. 3. ~60% bilateral cavernous ICA narrowing due to calcified plaque. 4. No flow limiting stenosis in the neck. 5. Presumed meningioma along the left frontal convexity measuring up to 4.3 x 1.3 cm. 6. Left nasal cavity polyp herniating into the nasopharynx. Patchy bilateral sinusitis.  MR BRAIN WO CONTRAST 07/31/2017 IMPRESSION: 1. Scattered, primarily cortical acute infarction along the left cerebral convexity, posterior watershed pattern. 2. Presumed meningioma along the lateral left frontal convexity measuring 4.1 x 1.4 cm. No neighboring brain edema. 3. Multiple small remote cerebellar infarcts. 4. Chronic sinusitis.  Left nasal cavity and nasopharynx polyp  MR BRAIN LIMITED WO CONTRAST 08/02/2017 IMPRESSION: 1. New acute infarction within the left lateral putamen extending into corona radiata. 2. Multiple small late acute/early subacute infarctions in the left watershed distribution are stable to minimally increased in size in comparison with the prior MRI of the brain. 3. Interval petechial hemorrhage within the larger focus infarction in the left parietal lobe. 4. Stable left frontal convexity extra-axial mass, presumably meningioma.   ASSESSMENT: Alex Cortez is a 67 y.o. year  old male here with left watershed infarcts with additional new left putamen into corona radiata infarct on 07/31/2017 secondary to most likely thrombotic secondary to left M1 large vessel disease stenosis. Vascular risk factors include CAD, HTN, HLD, DM and px stroke.  Patient returns today for follow-up visit and overall is been stable from a stroke standpoint with only residual action tremor.  Continued complaints of right hand pain but plans on undergoing procedure on 12/13/2017.    PLAN: -Continue clopidogrel 75 mg daily  and Lipitor for secondary stroke prevention -Start propranolol 40 mg twice daily for action tremor -advised patient to call office after 1 week if tolerating well but continues to have tremors we can consider increasing at that time.  He was also advised to call office after 1 week if unable to tolerate -30 day cardiac monitor to r/o atrial fibrillation -requested patient to have this completed at established cardiologist office in [place], [place] -we will reach out to them to ensure 30-day cardiac monitor is completed -also advised patient to call to schedule follow-up appointment for routine monitoring -Patient states that he was told by orthopedic surgeon to obtain clearance from our office to possibly stop Plavix prior to planned procedure -advised patient that typically we recommend waiting 6 months post stroke prior to elective procedure when blood thinners may have to be stopped due to increased risk of stroke within those first 6 months.  Patient is currently 4 months post stroke at this time.  Will reach out to orthopedic surgeon in regards to need of stopping Plavix for this procedure and if so, recommend waiting until November to have this completed.  Inform patient that stopping Plavix 2 to 3 days prior to procedure does have an increased risk of stroke while off Plavix but this risk is decreased after 6 months post stroke.  Patient verbalized understanding. -F/u with  PCP/endocrinologist regarding your HLD, HTN DM management -continue to monitor BP at home -Continue to stay active as tolerated and maintain a healthy diet -Maintain strict control of hypertension with blood pressure goal below 130/90, diabetes with  hemoglobin A1c goal below 6.5% and cholesterol with LDL cholesterol (bad cholesterol) goal below 70 mg/dL. I also advised the patient to eat a healthy diet with plenty of whole grains, cereals, fruits and vegetables, exercise regularly and maintain ideal body weight.  Follow up in 3 months for follow-up of action tremors or call earlier if needed   Greater than 50% of time during this 25 minute visit was spent on counseling,explanation of diagnosis of multiple infarcts, reviewing risk factor management of HLD, HTN and DM, planning of further management, discussion with patient and family and coordination of care   Venancio Poisson, [facility]  [facility] [address] [place], [place] [address]  Phone [phone] Fax [phone]

## 2017-12-01 NOTE — Progress Notes (Signed)
I agree with the above plan 

## 2017-12-01 NOTE — Telephone Encounter (Signed)
Received call from [facility] that pt was to have 30 day event monitor for CVA and that order was placed back in June - see the order in Epic however it did not drop into a work que or triage pool that I could find and we were not aware until phone call today - will enroll and notify patient that monitor will be mailed to pt home - also made f/u with Dr Bronson Ing in December (next available)

## 2017-12-01 NOTE — Telephone Encounter (Signed)
Noted! Thank you

## 2017-12-01 NOTE — Patient Instructions (Signed)
Continue clopidogrel 75 mg daily  and lipitor  for secondary stroke prevention  Start propranolol 40mg  twice a day   Continue to follow up with PCP regarding choletserol and blood pressure management   Continue to follow with endocrinologist for diabetes management  I will speak to your orthopedic surgeon in regards to stopping plavix prior to procedure - you will be called by either our office or theirs once this will be determined   Continue to monitor blood pressure at home  Maintain strict control of hypertension with blood pressure goal below 130/90, diabetes with hemoglobin A1c goal below 6.5% and cholesterol with LDL cholesterol (bad cholesterol) goal below 70 mg/dL. I also advised the patient to eat a healthy diet with plenty of whole grains, cereals, fruits and vegetables, exercise regularly and maintain ideal body weight.  Followup in the future with me in 3 months or call earlier if needed       Thank you for coming to see [place] at [facility]. I hope we have been able to provide you high quality care today.  You may receive a patient satisfaction survey over the next few weeks. We would appreciate your feedback and comments so that we may continue to improve ourselves and the health of our patients.

## 2017-12-06 ENCOUNTER — Other Ambulatory Visit: Payer: Self-pay | Admitting: Orthopedic Surgery

## 2017-12-13 ENCOUNTER — Ambulatory Visit (HOSPITAL_BASED_OUTPATIENT_CLINIC_OR_DEPARTMENT_OTHER): Admission: RE | Admit: 2017-12-13 | Payer: Medicare Other | Source: Ambulatory Visit | Admitting: Orthopedic Surgery

## 2017-12-13 ENCOUNTER — Ambulatory Visit (INDEPENDENT_AMBULATORY_CARE_PROVIDER_SITE_OTHER): Payer: Medicare Other

## 2017-12-13 ENCOUNTER — Encounter (HOSPITAL_BASED_OUTPATIENT_CLINIC_OR_DEPARTMENT_OTHER): Admission: RE | Payer: Self-pay | Source: Ambulatory Visit

## 2017-12-13 DIAGNOSIS — I63512 Cerebral infarction due to unspecified occlusion or stenosis of left middle cerebral artery: Secondary | ICD-10-CM

## 2017-12-13 DIAGNOSIS — I4891 Unspecified atrial fibrillation: Secondary | ICD-10-CM | POA: Diagnosis not present

## 2017-12-13 DIAGNOSIS — I679 Cerebrovascular disease, unspecified: Secondary | ICD-10-CM | POA: Diagnosis not present

## 2017-12-13 SURGERY — ARTHROSCOPY, WRIST
Anesthesia: Regional | Site: Wrist | Laterality: Right

## 2017-12-26 ENCOUNTER — Encounter: Payer: Self-pay | Admitting: "Endocrinology

## 2017-12-26 DIAGNOSIS — R5383 Other fatigue: Secondary | ICD-10-CM | POA: Diagnosis not present

## 2017-12-26 DIAGNOSIS — I10 Essential (primary) hypertension: Secondary | ICD-10-CM | POA: Diagnosis not present

## 2017-12-26 DIAGNOSIS — F5221 Male erectile disorder: Secondary | ICD-10-CM | POA: Diagnosis not present

## 2017-12-26 DIAGNOSIS — E1165 Type 2 diabetes mellitus with hyperglycemia: Secondary | ICD-10-CM | POA: Diagnosis not present

## 2017-12-26 DIAGNOSIS — E782 Mixed hyperlipidemia: Secondary | ICD-10-CM | POA: Diagnosis not present

## 2017-12-26 LAB — LIPID PANEL
CHOLESTEROL: 143 (ref 0–200)
HDL: 37 (ref 35–70)
LDL CALC: 88
TRIGLYCERIDES: 91 (ref 40–160)

## 2017-12-26 LAB — BASIC METABOLIC PANEL
BUN: 15 (ref 4–21)
Creatinine: 1.1 (ref 0.6–1.3)

## 2017-12-26 LAB — HEMOGLOBIN A1C: HEMOGLOBIN A1C: 9.7 — AB (ref 4.0–6.0)

## 2017-12-29 DIAGNOSIS — I10 Essential (primary) hypertension: Secondary | ICD-10-CM | POA: Diagnosis not present

## 2017-12-29 DIAGNOSIS — I69851 Hemiplegia and hemiparesis following other cerebrovascular disease affecting right dominant side: Secondary | ICD-10-CM | POA: Diagnosis not present

## 2017-12-29 DIAGNOSIS — Z23 Encounter for immunization: Secondary | ICD-10-CM | POA: Diagnosis not present

## 2017-12-29 DIAGNOSIS — I240 Acute coronary thrombosis not resulting in myocardial infarction: Secondary | ICD-10-CM | POA: Diagnosis not present

## 2017-12-29 DIAGNOSIS — E1165 Type 2 diabetes mellitus with hyperglycemia: Secondary | ICD-10-CM | POA: Diagnosis not present

## 2017-12-29 DIAGNOSIS — Z6834 Body mass index (BMI) 34.0-34.9, adult: Secondary | ICD-10-CM | POA: Diagnosis not present

## 2017-12-29 DIAGNOSIS — D126 Benign neoplasm of colon, unspecified: Secondary | ICD-10-CM | POA: Diagnosis not present

## 2017-12-29 DIAGNOSIS — D329 Benign neoplasm of meninges, unspecified: Secondary | ICD-10-CM | POA: Diagnosis not present

## 2018-01-07 ENCOUNTER — Telehealth: Payer: Self-pay | Admitting: Cardiology

## 2018-01-07 NOTE — Telephone Encounter (Signed)
Received outpatient call from [employer] cardiac monitoring service. The patient was noted to have had a 13 beat run of VT with underlying rhythm of NSR with a rate at 80bpm. Monitor team called patient with no response. Repeat attempted to contact patient by myself without success. I left a message with ED instructions if pt symptomatic.    Kathyrn Drown NP-C [place] Pager: [phone]

## 2018-01-12 ENCOUNTER — Other Ambulatory Visit: Payer: Self-pay

## 2018-01-12 ENCOUNTER — Encounter: Payer: Self-pay | Admitting: "Endocrinology

## 2018-01-12 ENCOUNTER — Ambulatory Visit (INDEPENDENT_AMBULATORY_CARE_PROVIDER_SITE_OTHER): Payer: Medicare Other | Admitting: "Endocrinology

## 2018-01-12 VITALS — BP 131/82 | HR 73 | Ht 74.0 in | Wt 264.0 lb

## 2018-01-12 DIAGNOSIS — E1159 Type 2 diabetes mellitus with other circulatory complications: Secondary | ICD-10-CM

## 2018-01-12 DIAGNOSIS — E6609 Other obesity due to excess calories: Secondary | ICD-10-CM | POA: Diagnosis not present

## 2018-01-12 DIAGNOSIS — E782 Mixed hyperlipidemia: Secondary | ICD-10-CM

## 2018-01-12 DIAGNOSIS — E039 Hypothyroidism, unspecified: Secondary | ICD-10-CM | POA: Insufficient documentation

## 2018-01-12 DIAGNOSIS — I10 Essential (primary) hypertension: Secondary | ICD-10-CM

## 2018-01-12 DIAGNOSIS — I63412 Cerebral infarction due to embolism of left middle cerebral artery: Secondary | ICD-10-CM | POA: Diagnosis not present

## 2018-01-12 DIAGNOSIS — Z6832 Body mass index (BMI) 32.0-32.9, adult: Secondary | ICD-10-CM

## 2018-01-12 MED ORDER — LEVOTHYROXINE SODIUM 50 MCG PO TABS: 50.0000 ug | ORAL_TABLET | Freq: Every day | ORAL | 3 refills | Status: DC

## 2018-01-12 MED ORDER — INSULIN REGULAR HUMAN (CONC) 500 UNIT/ML ~~LOC~~ SOPN: 60.0000 [IU] | PEN_INJECTOR | Freq: Three times a day (TID) | SUBCUTANEOUS | 0 refills | Status: DC

## 2018-01-12 MED ORDER — INSULIN REGULAR HUMAN (CONC) 500 UNIT/ML ~~LOC~~ SOPN: 60.0000 [IU] | PEN_INJECTOR | Freq: Three times a day (TID) | SUBCUTANEOUS | 2 refills | Status: DC

## 2018-01-12 NOTE — Progress Notes (Signed)
Endocrinology follow-up note  Subjective:    Patient ID: Alex Cortez, male    DOB: Nov 23, 1950, PCP Burdine, Virgina Evener, MD   Past Medical History:  Diagnosis Date  . CAD (coronary artery disease)    nonobstructive CAD/patent LAD stent site; EF 65%, 08/2011; NSTEMI/DES LAD, 6/20 11  . Chest pain   . Diabetes mellitus    insulin-dependent  . Dyslipidemia   . HTN (hypertension)   . Post MI syndrome ([place]) 08-29-2009  . Stroke [facility]) 10/22/2012   Left inferior pons   Past Surgical History:  Procedure Laterality Date  . CORONARY ANGIOPLASTY WITH STENT PLACEMENT     Social History   Socioeconomic History  . Marital status: Married    Spouse name: Not on file  . Number of children: Not on file  . Years of education: Not on file  . Highest education level: Not on file  Occupational History  . Not on file  Social Needs  . Financial resource strain: Not on file  . Food insecurity:    Worry: Not on file    Inability: Not on file  . Transportation needs:    Medical: Not on file    Non-medical: Not on file  Tobacco Use  . Smoking status: Former Smoker    Packs/day: 0.50    Years: 23.00    Pack years: 11.50    Types: Cigarettes    Start date: 03/16/1955    Last attempt to quit: 03/15/1988    Years since quitting: 29.8  . Smokeless tobacco: Never Used  Substance and Sexual Activity  . Alcohol use: No    Alcohol/week: 0.0 standard drinks  . Drug use: No  . Sexual activity: Not on file  Lifestyle  . Physical activity:    Days per week: Not on file    Minutes per session: Not on file  . Stress: Not on file  Relationships  . Social connections:    Talks on phone: Patient refused    Gets together: Patient refused    Attends religious service: Patient refused    Active member of club or organization: Patient refused    Attends meetings of clubs or organizations: Patient refused    Relationship status: Patient refused  Other Topics Concern  . Not on file  Social History  Narrative   Works at [employer]. Married.    Outpatient Encounter Medications as of 01/12/2018  Medication Sig  . atorvastatin (LIPITOR) 80 MG tablet Take 1 tablet (80 mg total) by mouth every evening.  . benazepril (LOTENSIN) 40 MG tablet Take 0.5 tablets (20 mg total) by mouth daily.  . clopidogrel (PLAVIX) 75 MG tablet Take 1 tablet (75 mg total) by mouth daily.  . Continuous Blood Gluc Sensor (FREESTYLE LIBRE 14 DAY SENSOR) MISC 1 each by Does not apply route every 14 (fourteen) days.  . insulin regular human CONCENTRATED (HUMULIN R U-500 KWIKPEN) 500 UNIT/ML kwikpen Inject 60 Units into the skin 3 (three) times daily with meals.  Marland Kitchen levothyroxine (SYNTHROID, LEVOTHROID) 50 MCG tablet Take 1 tablet (50 mcg total) by mouth daily before breakfast.  . metFORMIN (GLUCOPHAGE) 1000 MG tablet Take 500 mg by mouth daily with breakfast.  . nitroGLYCERIN (NITROSTAT) 0.4 MG SL tablet Place 1 tablet (0.4 mg total) under the tongue every 5 (five) minutes as needed.  . ONE TOUCH ULTRA TEST test strip USE TO TEST BLOOD SUGAR FOUR TIMES DAILY E11.65  . propranolol (INDERAL) 40 MG tablet Take 0.5 tablets (20 mg  total) by mouth 2 (two) times daily.  . [DISCONTINUED] insulin regular human CONCENTRATED (HUMULIN R U-500 KWIKPEN) 500 UNIT/ML kwikpen Inject 50 Units into the skin 3 (three) times daily with meals.   No facility-administered encounter medications on file as of 01/12/2018.    ALLERGIES: Allergies  Allergen Reactions  . Shrimp [Shellfish Allergy]     Swelling and hives    VACCINATION STATUS:  There is no immunization history on file for this patient.  Diabetes  He presents for his follow-up diabetic visit. He has type 2 diabetes mellitus. Onset time: He was diagnosed at approximate age of 46 years. His disease course has been improving. There are no hypoglycemic associated symptoms. Pertinent negatives for hypoglycemia include no confusion, headaches, pallor or seizures. Pertinent negatives  for diabetes include no chest pain, no fatigue, no polydipsia, no polyphagia, no polyuria and no weakness. There are no hypoglycemic complications. Symptoms are improving. Diabetic complications include a CVA and nephropathy. Risk factors for coronary artery disease include diabetes mellitus, dyslipidemia, hypertension, male sex, obesity and sedentary lifestyle. Current diabetic treatment includes intensive insulin program. He is compliant with treatment most of the time. His weight is increasing steadily. He is following a generally unhealthy (He consumes a large quantity of soda.) diet. When asked about meal planning, he reported none. He has had a previous visit with a dietitian. He never participates in exercise. His home blood glucose trend is decreasing steadily. His breakfast blood glucose range is generally 180-200 mg/dl. His lunch blood glucose range is generally 180-200 mg/dl. His dinner blood glucose range is generally 180-200 mg/dl. His bedtime blood glucose range is generally 180-200 mg/dl. His overall blood glucose range is 180-200 mg/dl. An ACE inhibitor/angiotensin II receptor blocker is being taken. Eye exam is current.  Hyperlipidemia  This is a chronic problem. The current episode started more than 1 year ago. The problem is controlled. Exacerbating diseases include chronic renal disease, diabetes and obesity. Pertinent negatives include no chest pain, leg pain, myalgias or shortness of breath. Current antihyperlipidemic treatment includes statins. Risk factors for coronary artery disease include diabetes mellitus, dyslipidemia, hypertension, male sex, obesity and a sedentary lifestyle.  Hypertension  This is a chronic problem. The current episode started more than 1 year ago. The problem is controlled. Pertinent negatives include no chest pain, headaches, neck pain, palpitations or shortness of breath. Risk factors for coronary artery disease include obesity, male gender, dyslipidemia,  diabetes mellitus, sedentary lifestyle and family history. Past treatments include ACE inhibitors. Hypertensive end-organ damage includes kidney disease and CVA. Identifiable causes of hypertension include chronic renal disease.    Review of Systems  Constitutional: Negative for fatigue and unexpected weight change.  HENT: Negative for dental problem, mouth sores and trouble swallowing.   Eyes: Negative for visual disturbance.  Respiratory: Negative for cough, choking, chest tightness, shortness of breath and wheezing.   Cardiovascular: Negative for chest pain, palpitations and leg swelling.  Gastrointestinal: Negative for abdominal distention, abdominal pain, constipation, diarrhea, nausea and vomiting.  Endocrine: Negative for polydipsia, polyphagia and polyuria.  Genitourinary: Negative for dysuria, flank pain, hematuria and urgency.  Musculoskeletal: Negative for back pain, gait problem, myalgias and neck pain.  Skin: Negative for pallor, rash and wound.  Neurological: Negative for seizures, syncope, weakness, numbness and headaches.  Psychiatric/Behavioral: Negative.  Negative for confusion and dysphoric mood.    Objective:    BP 131/82   Pulse 73   Ht 6\' 2"  (1.88 m)   Wt 264 lb (119.7 kg)  BMI 33.90 kg/m   Wt Readings from Last 3 Encounters:  01/12/18 264 lb (119.7 kg)  12/01/17 260 lb 3.2 oz (118 kg)  11/30/17 261 lb (118.4 kg)    Physical Exam  Constitutional: He is oriented to person, place, and time. He appears well-developed. He is cooperative. No distress.  HENT:  Head: Normocephalic and atraumatic.  Eyes: EOM are normal.  Neck: Normal range of motion. Neck supple. No tracheal deviation present. No thyromegaly present.  Cardiovascular: Normal rate, S1 normal and S2 normal. Exam reveals no gallop.  No murmur heard. Pulses:      Dorsalis pedis pulses are 1+ on the right side, and 1+ on the left side.       Posterior tibial pulses are 1+ on the right side, and 1+ on  the left side.  Pulmonary/Chest: Effort normal. No respiratory distress. He has no wheezes.  Abdominal: He exhibits no distension. There is no tenderness. There is no guarding and no CVA tenderness.  Has diffuse lipodystrophy on abdominal skin.  Musculoskeletal: He exhibits no edema.       Right shoulder: He exhibits no swelling and no deformity.  Neurological: He is alert and oriented to person, place, and time. He has normal strength. No cranial nerve deficit or sensory deficit. Gait normal.  Skin: Skin is warm and dry. No rash noted. No cyanosis. Nails show no clubbing.  Psychiatric: He has a normal mood and affect. His speech is normal. Judgment normal. Cognition and memory are normal.    CMP Latest Ref Rng & Units 12/26/2017 09/20/2017 08/01/2017  Glucose 65 - 99 mg/dL - - 216(H)  BUN 4 - 21 15 14 11   Creatinine 0.6 - 1.3 1.1 1.1 0.90  Sodium 135 - 145 mmol/L - - 141  Potassium 3.5 - 5.1 mmol/L - - 3.8  Chloride 101 - 111 mmol/L - - 105  CO2 22 - 32 mmol/L - - 25  Calcium 8.9 - 10.3 mg/dL - - 9.2  Total Protein 6.5 - 8.1 g/dL - - -  Total Bilirubin 0.3 - 1.2 mg/dL - - -  Alkaline Phos 38 - 126 U/L - - -  AST 15 - 41 U/L - - -  ALT 17 - 63 U/L - - -    Recent Results (from the past 2160 hour(s))  Basic metabolic panel     Status: None   Collection Time: 12/26/17 12:00 AM  Result Value Ref Range   BUN 15 4 - 21   Creatinine 1.1 0.6 - 1.3  Lipid panel     Status: None   Collection Time: 12/26/17 12:00 AM  Result Value Ref Range   Triglycerides 91 40 - 160   Cholesterol 143 0 - 200   HDL 37 35 - 70   LDL Cholesterol 88   Hemoglobin A1c     Status: Abnormal   Collection Time: 12/26/17 12:00 AM  Result Value Ref Range   Hgb A1c MFr Bld 9.7 (A) 4.0 - 6.0   Assessment & Plan:   1. Type 2 diabetes mellitus with vascular disease (HCC)  -His  diabetes is  complicated by recurrent CVA , coronary artery disease, obesity/sedentary life and patient remains at extremely  high risk  for more acute and chronic complications of diabetes which include CAD, CVA, CKD, retinopathy, and neuropathy. These are all discussed in detail with the patient.  -Patient comes with his logs showing improved glycemic profile on insulin U500.  His previsit A1c is higher  at 9.7%.    No documented no reported hypoglycemia.  However, he made insulin dosing errors, took insulin even when his blood glucose readings are below 90 mg/dL.      - Glucose logs and insulin administration records pertaining to this visit,  to be scanned into patient's records.  Recent labs reviewed.  - I have re-counseled the patient on diet management and weight loss  by adopting a carbohydrate restricted / protein rich  Diet.  - He  still admits to dietary indiscretion including consumption of large quantities of soda.    -  Suggestion is made for him to avoid simple carbohydrates  from his diet including Cakes, Sweet Desserts / Pastries, Ice Cream, Soda (diet and regular), Sweet Tea, Candies, Chips, Cookies, Store Bought Juices, Alcohol in Excess of  1-2 drinks a day, Artificial Sweeteners, and "Sugar-free" Products. This will help patient to have stable blood glucose profile and potentially avoid unintended weight gain.   - Patient is advised to stick to a routine mealtimes to eat 3 meals  a day and avoid unnecessary snacks ( to snack only to correct hypoglycemia).  - I have approached patient with the following individualized plan to manage diabetes and patient agrees.  -He did better on insulin U500. -He is advised to increase his Humulin U500 to Ext units 3 times daily before meals for pre-meal blood glucose readings of 90 mg/dL or above.   -I have advised him on how he can rotate insulin injection sites on his abdominal skin. -He is warned not not to take insulin without proper monitoring of blood glucose.  -He has difficulty keeping his CGM sensor on his skin, advised to use mechanisms including Tegaderm or wrap  around his arm to keep the sensor on his skin.  Patient is advised to call clinic if he registers blood glucose less than 70 mg/dL or greater than 200 mg/dL. -He will continue to use metformin 500 mg daily at breakfast.  - He reports that he did not qualify for bariatric surgery for Medicare coverage due to the fact that he is BMI is 33.  - Patient specific target  for A1c; LDL, HDL, Triglycerides, and  Waist Circumference were discussed in detail.  2) BP/HTN:  His blood pressure is controlled to target.  He is advised to continue his current blood pressure medications including benazepril 40 mg p.o. twice daily.   3) Lipids/HPL: His lipid panel is uncontrolled with LDL of 88.    He is advised to continue atorvastatin 80 mg p.o. nightly.  Side effects and precautions discussed with him.   4)  Weight/Diet: Has no success for weight control mainly due to his dietary indiscretion.  He has consulted with CDE.  5) hypothyroidism-new diagnosis for him  -He will benefit from early initiation of thyroid hormone supplement.  I discussed and initiated levothyroxine 50 mcg p.o. every morning.  - We discussed about correct intake of levothyroxine, at fasting, with water, separated by at least 30 minutes from breakfast, and separated by more than 4 hours from calcium, iron, multivitamins, acid reflux medications (PPIs). -Patient is made aware of the fact that thyroid hormone replacement is needed for life, dose to be adjusted by periodic monitoring of thyroid function tests.   6) Chronic Care/Health Maintenance:  -Patient  is  on ACEI/ARB and Statin medications and encouraged to continue to follow up with Ophthalmology, urologist given his recent recurrent CVA, podiatrist at least yearly or according to recommendations, and advised to  stay away from smoking. I have recommended yearly flu vaccine and pneumonia vaccination at least every 5 years; moderate intensity exercise for up to 150 minutes weekly; and   sleep for at least 7 hours a day.  - I advised patient to maintain close follow up with Burdine, Virgina Evener, MD for primary care needs.  - Time spent with the patient: 25 min, of which >50% was spent in reviewing his blood glucose logs , discussing his hypo- and hyper-glycemic episodes, reviewing his current and  previous labs and insulin doses and developing a plan to avoid hypo- and hyper-glycemia. Please refer to Patient Instructions for Blood Glucose Monitoring and Insulin/Medications Dosing Guide"  in media tab for additional information. Marvene Staff Mallek participated in the discussions, expressed understanding, and voiced agreement with the above plans.  All questions were answered to his satisfaction. he is encouraged to contact clinic should he have any questions or concerns prior to his return visit.    Follow up plan: -Return in about 4 months (around 05/13/2018) for Follow up with Pre-visit Labs, Meter, and Logs.  Glade Lloyd, MD Phone: [phone]  Fax: [phone]  -  This note was partially dictated with voice recognition software. Similar sounding words can be transcribed inadequately or may not  be corrected upon review.  01/12/2018, 10:23 AM

## 2018-01-12 NOTE — Patient Instructions (Signed)

## 2018-01-18 ENCOUNTER — Telehealth: Payer: Self-pay | Admitting: *Deleted

## 2018-01-18 MED ORDER — PROPRANOLOL HCL 40 MG PO TABS: 40.0000 mg | ORAL_TABLET | Freq: Two times a day (BID) | ORAL | 3 refills | Status: DC

## 2018-01-18 NOTE — Telephone Encounter (Signed)
LVM informing the  patient this his recent loop recorder did not show an irregular rhythm of  atrial fibrillation. Left number for any questions.

## 2018-01-18 NOTE — Telephone Encounter (Signed)
Future refills will be manage by pts PCP.

## 2018-01-18 NOTE — Addendum Note (Signed)
Addended by: Venancio Poisson on: 01/18/2018 08:20 AM   Modules accepted: Orders

## 2018-02-13 ENCOUNTER — Telehealth: Payer: Self-pay | Admitting: Cardiovascular Disease

## 2018-02-13 ENCOUNTER — Encounter: Payer: Self-pay | Admitting: *Deleted

## 2018-02-13 ENCOUNTER — Encounter: Payer: Self-pay | Admitting: Cardiovascular Disease

## 2018-02-13 ENCOUNTER — Ambulatory Visit (INDEPENDENT_AMBULATORY_CARE_PROVIDER_SITE_OTHER): Payer: Medicare Other | Admitting: Cardiovascular Disease

## 2018-02-13 VITALS — BP 152/74 | HR 65 | Ht 75.0 in | Wt 269.6 lb

## 2018-02-13 DIAGNOSIS — I472 Ventricular tachycardia: Secondary | ICD-10-CM | POA: Diagnosis not present

## 2018-02-13 DIAGNOSIS — E785 Hyperlipidemia, unspecified: Secondary | ICD-10-CM | POA: Diagnosis not present

## 2018-02-13 DIAGNOSIS — I25118 Atherosclerotic heart disease of native coronary artery with other forms of angina pectoris: Secondary | ICD-10-CM | POA: Diagnosis not present

## 2018-02-13 DIAGNOSIS — I63412 Cerebral infarction due to embolism of left middle cerebral artery: Secondary | ICD-10-CM | POA: Diagnosis not present

## 2018-02-13 DIAGNOSIS — I639 Cerebral infarction, unspecified: Secondary | ICD-10-CM | POA: Diagnosis not present

## 2018-02-13 DIAGNOSIS — I10 Essential (primary) hypertension: Secondary | ICD-10-CM | POA: Diagnosis not present

## 2018-02-13 DIAGNOSIS — I4729 Other ventricular tachycardia: Secondary | ICD-10-CM

## 2018-02-13 NOTE — Patient Instructions (Signed)
Medication Instructions:  Continue all current medications.  Labwork: none  Testing/Procedures:  Your physician has requested that you have a lexiscan myoview. For further information please visit [email or web address]. Please follow instruction sheet, as given.  Office will contact with results via phone or letter.    Follow-Up: 3 months   Any Other Special Instructions Will Be Listed Below (If Applicable).  If you need a refill on your cardiac medications before your next appointment, please call your pharmacy.  

## 2018-02-13 NOTE — Progress Notes (Signed)
SUBJECTIVE: The patient presents for routine follow-up.  Event monitoring demonstrated a 13 beat run of ventricular tachycardia.  He has a history of coronary artery disease with a previously placed mid LAD drug-eluting stent in June 2011. His most recent coronary angiogram was in June 2013 which revealed a widely patent stent. He also has hypertension, hyperlipidemia, CVA in 10/2012 and again in May 2019, and insulin-dependent diabetes mellitus.   Echocardiogram on 08/01/2017 demonstrated normal left ventricular systolic function and regional wall motion, LVEF 55 to 60%, and grade 1 diastolic dysfunction.  Event monitoring reported in November 2019 showed paroxysms of nonsustained ventricular tachycardia.  He denies chest pain and palpitations.  He does experience exertional fatigue.  He has chronic exertional dyspnea.  He thinks this predates his stroke in May 2019.  He had been on 325 mg at the time of his stroke in May 2019.  He has subsequently been switched to Plavix.    Review of Systems: As per "subjective", otherwise negative.  No Known Allergies  Current Outpatient Medications  Medication Sig Dispense Refill  . atorvastatin (LIPITOR) 80 MG tablet Take 1 tablet (80 mg total) by mouth every evening. 90 tablet 3  . benazepril (LOTENSIN) 40 MG tablet Take 0.5 tablets (20 mg total) by mouth daily. 30 tablet 0  . clopidogrel (PLAVIX) 75 MG tablet Take 1 tablet (75 mg total) by mouth daily. 30 tablet 5  . Continuous Blood Gluc Sensor (FREESTYLE LIBRE 14 DAY SENSOR) MISC 1 each by Does not apply route every 14 (fourteen) days. 7 each 2  . insulin regular human CONCENTRATED (HUMULIN R U-500 KWIKPEN) 500 UNIT/ML kwikpen Inject 60 Units into the skin 3 (three) times daily with meals. 36 mL 0  . levothyroxine (SYNTHROID, LEVOTHROID) 50 MCG tablet Take 1 tablet (50 mcg total) by mouth daily before breakfast. 30 tablet 3  . metFORMIN (GLUCOPHAGE) 1000 MG tablet Take 500 mg by mouth  daily with breakfast.  3  . nitroGLYCERIN (NITROSTAT) 0.4 MG SL tablet Place 1 tablet (0.4 mg total) under the tongue every 5 (five) minutes as needed. 25 tablet 3  . ONE TOUCH ULTRA TEST test strip USE TO TEST BLOOD SUGAR FOUR TIMES DAILY E11.65 150 each 5  . propranolol (INDERAL) 40 MG tablet Take 1 tablet (40 mg total) by mouth 2 (two) times daily. 30 tablet 3   No current facility-administered medications for this visit.     Past Medical History:  Diagnosis Date  . CAD (coronary artery disease)    nonobstructive CAD/patent LAD stent site; EF 65%, 08/2011; NSTEMI/DES LAD, 6/20 11  . Chest pain   . Diabetes mellitus    insulin-dependent  . Dyslipidemia   . HTN (hypertension)   . Post MI syndrome ([place]) 08-29-2009  . Stroke [facility]) 10/22/2012   Left inferior pons    Past Surgical History:  Procedure Laterality Date  . CORONARY ANGIOPLASTY WITH STENT PLACEMENT      Social History   Socioeconomic History  . Marital status: Married    Spouse name: Not on file  . Number of children: Not on file  . Years of education: Not on file  . Highest education level: Not on file  Occupational History  . Not on file  Social Needs  . Financial resource strain: Not on file  . Food insecurity:    Worry: Not on file    Inability: Not on file  . Transportation needs:    Medical: Not on file  Non-medical: Not on file  Tobacco Use  . Smoking status: Former Smoker    Packs/day: 0.50    Years: 23.00    Pack years: 11.50    Types: Cigarettes    Start date: 03/16/1955    Last attempt to quit: 03/15/1988    Years since quitting: 29.9  . Smokeless tobacco: Never Used  Substance and Sexual Activity  . Alcohol use: No    Alcohol/week: 0.0 standard drinks  . Drug use: No  . Sexual activity: Not on file  Lifestyle  . Physical activity:    Days per week: Not on file    Minutes per session: Not on file  . Stress: Not on file  Relationships  . Social connections:    Talks on phone: Patient  refused    Gets together: Patient refused    Attends religious service: Patient refused    Active member of club or organization: Patient refused    Attends meetings of clubs or organizations: Patient refused    Relationship status: Patient refused  . Intimate partner violence:    Fear of current or ex partner: Patient refused    Emotionally abused: Patient refused    Physically abused: Patient refused    Forced sexual activity: Patient refused  Other Topics Concern  . Not on file  Social History Narrative   Works at [employer]. Married.      Vitals:   02/13/18 1459  BP: (!) 152/74  Pulse: 65  SpO2: 97%  Weight: 269 lb 9.6 oz (122.3 kg)  Height: 6\' 3"  (1.905 m)    Wt Readings from Last 3 Encounters:  02/13/18 269 lb 9.6 oz (122.3 kg)  01/12/18 264 lb (119.7 kg)  12/01/17 260 lb 3.2 oz (118 kg)     PHYSICAL EXAM General: NAD HEENT: Normal. Neck: No JVD, no thyromegaly. Lungs: Clear to auscultation bilaterally with normal respiratory effort. CV: Regular rate and rhythm, normal S1/S2, no S3/S4, no murmur. No pretibial or periankle edema.     Abdomen: Soft, nontender, no distention.  Neurologic: Alert and oriented.  Right hand weakness. Psych: Normal affect. Skin: Normal. Musculoskeletal: No gross deformities.    ECG: Reviewed above under Subjective   Labs: Lab Results  Component Value Date/Time   K 3.8 08/01/2017 02:43 AM   BUN 15 12/26/2017   CREATININE 1.1 12/26/2017   CREATININE 0.90 08/01/2017 02:43 AM   CREATININE 1.35 (H) 07/12/2017 11:14 AM   ALT 56 07/31/2017 05:55 AM   TSH 2.28 03/18/2017   HGB 15.3 07/31/2017 06:01 AM     Lipids: Lab Results  Component Value Date/Time   LDLCALC 88 12/26/2017   CHOL 143 12/26/2017   TRIG 91 12/26/2017   HDL 37 12/26/2017       ASSESSMENT AND PLAN:  1. CAD:  He does have exertional fatigue.  He has had no chest pain since undergoing PCI of the mid-LAD in June 2011. His most recent coronary angiogram in  08/2011 showed a widely patent stent.  Continue Plavix (history of CVA), propranolol, and atorvastatin. Given his concomitant ventricular tachycardia, I will obtain a Lexiscan Myoview stress test to evaluate for significant ischemic territories.  2. Hypertension: Blood pressure is mildly elevated today. No changes.  3. Hyperlipidemia: Continue atorvastatin 80 mg.    LDL 88 on 12/26/2017.  Full lipid panel reviewed above.  4.  Nonsustained ventricular tachycardia: Episodes of nonsustained ventricular tachycardia with cardiac event monitoring.  Heart rate is currently 65 bpm.  He had been  on Coreg and is currently on propranolol.  Given his concomitant exertional fatigue,  I will obtain a Lexiscan Myoview stress test to evaluate for significant ischemic territories.  5.  Recurrent CVA: Currently on Plavix and atorvastatin.  No evidence of atrial fibrillation with event monitoring.   Disposition: Follow up 3-4 months   Kate Sable, M.D., F.A.C.C.

## 2018-02-13 NOTE — Telephone Encounter (Signed)
Pre-cert Verification for the following procedure   Lexiscan myoview scheduled for 02/14/2018 at [facility]

## 2018-02-14 SURGERY — Surgical Case
Anesthesia: *Unknown

## 2018-02-15 ENCOUNTER — Encounter (HOSPITAL_BASED_OUTPATIENT_CLINIC_OR_DEPARTMENT_OTHER)
Admission: RE | Admit: 2018-02-15 | Discharge: 2018-02-15 | Disposition: A | Discharge status: Home / Self Care | Payer: Medicare Other | Source: Ambulatory Visit | Attending: Cardiovascular Disease | Admitting: Cardiovascular Disease

## 2018-02-15 ENCOUNTER — Encounter (HOSPITAL_COMMUNITY): Payer: Self-pay

## 2018-02-15 ENCOUNTER — Encounter (HOSPITAL_COMMUNITY)
Admission: RE | Admit: 2018-02-15 | Discharge: 2018-02-15 | Disposition: A | Discharge status: Home / Self Care | Payer: Medicare Other | Source: Ambulatory Visit | Attending: Cardiovascular Disease | Admitting: Cardiovascular Disease

## 2018-02-15 DIAGNOSIS — I25118 Atherosclerotic heart disease of native coronary artery with other forms of angina pectoris: Secondary | ICD-10-CM | POA: Insufficient documentation

## 2018-02-15 DIAGNOSIS — I472 Ventricular tachycardia: Secondary | ICD-10-CM | POA: Insufficient documentation

## 2018-02-15 DIAGNOSIS — I4729 Other ventricular tachycardia: Secondary | ICD-10-CM

## 2018-02-15 LAB — NM MYOCAR MULTI W/SPECT W/WALL MOTION / EF
CHL CUP NUCLEAR SSS: 2
LV sys vol: 78 mL
LVDIAVOL: 140 mL (ref 62–150)
Peak HR: 89 {beats}/min
RATE: 0.42
Rest HR: 71 {beats}/min
SDS: 1
SRS: 1
TID: 1.29

## 2018-02-15 MED ORDER — SODIUM CHLORIDE 0.9% FLUSH
INTRAVENOUS | Status: AC
  Administered 2018-02-15: 10 mL via INTRAVENOUS
  Filled 2018-02-15: qty 10

## 2018-02-15 MED ORDER — TECHNETIUM TC 99M TETROFOSMIN IV KIT
30.0000 | PACK | Freq: Once | INTRAVENOUS | Status: AC | PRN
  Administered 2018-02-15: 30.8 via INTRAVENOUS

## 2018-02-15 MED ORDER — REGADENOSON 0.4 MG/5ML IV SOLN
0.4000 mg | Freq: Once | INTRAVENOUS | Status: AC
  Administered 2018-02-15: 0.4 mg via INTRAVENOUS
  Filled 2018-02-15: qty 5

## 2018-02-15 MED ORDER — TECHNETIUM TC 99M TETROFOSMIN IV KIT
10.0000 | PACK | Freq: Once | INTRAVENOUS | Status: AC | PRN
  Administered 2018-02-15: 11 via INTRAVENOUS

## 2018-02-15 MED ORDER — REGADENOSON 0.4 MG/5ML IV SOLN
INTRAVENOUS | Status: AC
  Administered 2018-02-15: 0.4 mg via INTRAVENOUS
  Filled 2018-02-15: qty 5

## 2018-02-16 ENCOUNTER — Telehealth: Payer: Self-pay

## 2018-02-16 DIAGNOSIS — R9439 Abnormal result of other cardiovascular function study: Secondary | ICD-10-CM

## 2018-02-16 NOTE — Telephone Encounter (Signed)
Pt notified,echo scheduled 

## 2018-02-16 NOTE — Telephone Encounter (Signed)
-----   Message from Herminio Commons, MD sent at 02/15/2018 12:55 PM EST ----- There is evidence for a possible small blockage.  This will be managed medically.  Pumping function assessment was discrepant in that the calculated LVEF was mildly reduced but visually appeared okay.  Let's obtain a limited echocardiogram to solely assess LVEF.

## 2018-02-20 ENCOUNTER — Telehealth: Payer: Self-pay | Admitting: *Deleted

## 2018-02-20 ENCOUNTER — Ambulatory Visit (HOSPITAL_COMMUNITY)
Admission: RE | Admit: 2018-02-20 | Discharge: 2018-02-20 | Disposition: A | Discharge status: Home / Self Care | Payer: Medicare Other | Source: Ambulatory Visit | Attending: Cardiovascular Disease | Admitting: Cardiovascular Disease

## 2018-02-20 DIAGNOSIS — Z8673 Personal history of transient ischemic attack (TIA), and cerebral infarction without residual deficits: Secondary | ICD-10-CM | POA: Insufficient documentation

## 2018-02-20 DIAGNOSIS — I209 Angina pectoris, unspecified: Secondary | ICD-10-CM | POA: Diagnosis not present

## 2018-02-20 DIAGNOSIS — Z87891 Personal history of nicotine dependence: Secondary | ICD-10-CM | POA: Diagnosis not present

## 2018-02-20 DIAGNOSIS — I119 Hypertensive heart disease without heart failure: Secondary | ICD-10-CM | POA: Insufficient documentation

## 2018-02-20 DIAGNOSIS — E785 Hyperlipidemia, unspecified: Secondary | ICD-10-CM | POA: Diagnosis not present

## 2018-02-20 DIAGNOSIS — R9439 Abnormal result of other cardiovascular function study: Secondary | ICD-10-CM | POA: Diagnosis not present

## 2018-02-20 NOTE — Progress Notes (Signed)
*  PRELIMINARY RESULTS* Echocardiogram 2D Echocardiogram LIMITED has been performed.  Leavy Cella 02/20/2018, 12:53 PM

## 2018-02-20 NOTE — Telephone Encounter (Signed)
Notes recorded by Laurine Blazer, LPN on 51/09/15 at 4:94 PM EST Patient notified. Copy to pmd. Patient had echo today & has been notified by [employer] staff today. EF on stress test was 44%. On echo today, EF was 60-65%. ------  Notes recorded by Herminio Commons, MD on 02/15/2018 at 12:55 PM EST There is evidence for a possible small blockage. This will be managed medically. Pumping function assessment was discrepant in that the calculated LVEF was mildly reduced but visually appeared okay. Let's obtain a limited echocardiogram to solely assess LVEF.

## 2018-03-01 NOTE — Progress Notes (Signed)
[facility] [address] [place]. [place] [address] [phone]       OFFICE FOLLOW UP NOTE  [name] Date of Birth:  06-28-1950 Medical Record Number:  [id number]   Reason for Referral:  hospital stroke follow up  CHIEF COMPLAINT:  Chief Complaint  Patient presents with  . Follow-up    [facility] follow up room 9 pt with wife pt stated he does not think inderal is helping the tremor   When interestingly HPI: Alex Cortez is being seen today in the office for left watershed infarcts with new left putamen into corona radiata infarct on 07/31/17. History obtained from patient and chart review. Reviewed all radiology images and labs personally.  [name] is a 67 y.o. male with history of CAD, HTN, HLD, DM, L pontine stroke who presented with R arm weakness.  CT head reviewed and showed no acute pathology.  CTA head and neck showed no emergent large vessel occlusion but did show moderate left M1 stenosis, bilateral cavernous ICA 60% stenosis, left frontal meningioma, left nasal/nasopharynx polyp and patching bilateral sinusitis.  MRI reviewed and showed scattered cortical and subcortical left watershed infarcts, left frontal meningioma, multiple small old infarct and chronic sinusitis.  2D echo showed an EF of 55 to 60% without source of embolus.  Lower extremity Doppler negative.  Repeat MRI on 08/02/2017 showed new lateral carcinoma extending into CR infarct.  LDL 87 and recommended to continue Lipitor 80 mg.  A1c uncontrolled at 9.9 and recommended close PCP follow-up.  Recommended DAPT for 3 months and then Plavix alone.  During hospitalization, all infarcts were felt to be thrombotic secondary to left M1 large vessel disease stenosis but unable to rule out atrial fibrillation and no A. fib was documented on time monitor during hospitalization ; recommended 30-day cardiac monitor as outpatient to rule out A. fib.  PT/OT recommended outpatient PT/OT/ST.   Patient was discharged in stable condition.  08/31/2017 visit: Patient is being seen today for hospital follow-up and is accompanied by his wife.  He continues to have very mild right hemiparesis this has been improving as he has been going to PT/OT in [place].  He did undergo ST for approximately 2 weeks and then was told he no longer needs speech therapy.  Blood pressure elevated at today's appointment 171/92 but this is monitored at home and typically SBP 140s.    He continues to take aspirin and Plavix without side effects of bleeding or bruising.  Continues to take Lipitor without side effects myalgias. He has returned to most previous activities but he has not returned to work or driving at this time.  Patient is a driver for a car company.  Denies new or worsening stroke/TIA symptoms.  12/02/2017 visit: Patient returns today for scheduled follow-up appointment.  Patient called office on 09/28/2017 (see phone notes) with complaints of right hand pain after PT session.  PCP referred him to [place] and was found to have right hand dystrophic calcification of the radial collateral side ligaments of his middle finger with an old fracture of the distal phalanx.  Per epic, patient will be undergoing arthroscopy of right wrist with debridement and possible shrinkage on 12/13/2017.  Patient also follows with endocrinology for continued management of DM with most recent A1c 8.7 (improvement from prior A1c at 9.9).  Patient states overall from a stroke standpoint, he has been doing well with main complaint of right hand pain.  He also  has complaints of action tremors that have been present since his stroke with only mild improvement.  He does state these are debilitating as he is unable to hold anything in his right hand such as a cell phone, unable to hold silverware or write as patient is right-handed.  No tremors present at rest.  He does state that when he puts pressure on his wrist the tremor  subsides slightly so that he is able to function with his right hand.  This does cause some increased pain to already injured right hand.  He does continue to take Plavix without side effects of bleeding or bruising.  Continues to take Lipitor without side effects myalgias.  Does monitor glucose levels at home and have been stable.  Blood pressure today satisfactory 138/69 and per wife, this reading is comparable to his home readings.  30-day cardiac monitor order was placed at prior visit patient states he was never called by his cardiology office therefore monitoring was not completed.  Denies new or worsening stroke/TIA symptoms.  Interval history 03/02/2018: Patient is being seen today for 29-month follow-up visit and is accompanied by his wife.  He did initiate propranolol 40 mg twice daily for action tremors and did receive some benefit where he is able to function more with his right hand such as writing or using small objects but he does still have mild tremoring that can worsen at times.  He also has complaints of bilateral hand numbness which is present in his entire hand and at times can radiate up into his arms.  He does state that he was experiencing this numbness on his right side after his stroke but was not experiencing this on his left side.  He is unable to state how long this has been going on but believes it has been present for the past couple months.  The numbness worsens when he is using his hands such as driving or doing some type of activity but can also be random and occur at rest.  He describes it as his hands are asleep and denies any associated nerve pain.  Denies any neck pain.  Denies any other numbness.  He also endorses swelling in his hands bilaterally where may be difficult to grasp an object.  He does continue to have some right upper extremity weakness from his stroke and is unsure if he has been experiencing weakness in his left upper extremity.  At prior appointment, patient  did have complaints of right wrist pain and was scheduled to undergo possible debridement due to findings of calcification but it was recommended to wait a total of 6 months post stroke prior to coming off of Plavix for this procedure.  He was advised to call orthopedic office if he would like to pursue procedure after 6 months post stroke.  Patient states that he no longer has been experiencing pain and is unsure if he needs to undergo this procedure.  He also continues to have mild dysarthria that can worsen at times but overall has been improving.  He has completed all therapies but continues to do exercises at home.  He continues on Plavix without side effects of bleeding or bruising.  Continues on atorvastatin 80 mg daily without side effects of myalgias.  Blood pressure today 145/79 which is slightly elevated for patient as he does continue to monitor at home.  He did undergo 30-day cardiac event monitor which was negative for atrial fibrillation.  No further concerns at this time.  ROS:   14 system review of systems performed and negative with exception of numbness and swelling in hands and arms, speech difficulty and weakness  PMH:  Past Medical History:  Diagnosis Date  . CAD (coronary artery disease)    nonobstructive CAD/patent LAD stent site; EF 65%, 08/2011; NSTEMI/DES LAD, 6/20 11  . Chest pain   . Diabetes mellitus    insulin-dependent  . Dyslipidemia   . HTN (hypertension)   . Post MI syndrome ([place]) 08-29-2009  . Stroke [facility]) 10/22/2012   Left inferior pons    PSH:  Past Surgical History:  Procedure Laterality Date  . CORONARY ANGIOPLASTY WITH STENT PLACEMENT      Social History:  Social History   Socioeconomic History  . Marital status: Married    Spouse name: Not on file  . Number of children: Not on file  . Years of education: Not on file  . Highest education level: Not on file  Occupational History  . Not on file  Social Needs  . Financial resource strain:  Not on file  . Food insecurity:    Worry: Not on file    Inability: Not on file  . Transportation needs:    Medical: Not on file    Non-medical: Not on file  Tobacco Use  . Smoking status: Former Smoker    Packs/day: 0.50    Years: 23.00    Pack years: 11.50    Types: Cigarettes    Start date: 03/16/1955    Last attempt to quit: 03/15/1988    Years since quitting: 29.9  . Smokeless tobacco: Never Used  Substance and Sexual Activity  . Alcohol use: No    Alcohol/week: 0.0 standard drinks  . Drug use: No  . Sexual activity: Not on file  Lifestyle  . Physical activity:    Days per week: Not on file    Minutes per session: Not on file  . Stress: Not on file  Relationships  . Social connections:    Talks on phone: Patient refused    Gets together: Patient refused    Attends religious service: Patient refused    Active member of club or organization: Patient refused    Attends meetings of clubs or organizations: Patient refused    Relationship status: Patient refused  . Intimate partner violence:    Fear of current or ex partner: Patient refused    Emotionally abused: Patient refused    Physically abused: Patient refused    Forced sexual activity: Patient refused  Other Topics Concern  . Not on file  Social History Narrative   Works at [employer]. Married.     Family History:  Family History  Problem Relation Age of Onset  . Hypertension Other        in all family members    Medications:   Current Outpatient Medications on File Prior to Visit  Medication Sig Dispense Refill  . atorvastatin (LIPITOR) 80 MG tablet Take 1 tablet (80 mg total) by mouth every evening. 90 tablet 3  . benazepril (LOTENSIN) 40 MG tablet Take 0.5 tablets (20 mg total) by mouth daily. 30 tablet 0  . clopidogrel (PLAVIX) 75 MG tablet Take 1 tablet (75 mg total) by mouth daily. 30 tablet 5  . Continuous Blood Gluc Sensor (FREESTYLE LIBRE 14 DAY SENSOR) MISC 1 each by Does not apply route every 14  (fourteen) days. 7 each 2  . insulin regular human CONCENTRATED (HUMULIN R U-500 KWIKPEN) 500 UNIT/ML kwikpen Inject  60 Units into the skin 3 (three) times daily with meals. 36 mL 0  . levothyroxine (SYNTHROID, LEVOTHROID) 50 MCG tablet Take 1 tablet (50 mcg total) by mouth daily before breakfast. 30 tablet 3  . metFORMIN (GLUCOPHAGE) 1000 MG tablet Take 500 mg by mouth daily with breakfast.  3  . nitroGLYCERIN (NITROSTAT) 0.4 MG SL tablet Place 1 tablet (0.4 mg total) under the tongue every 5 (five) minutes as needed. 25 tablet 3  . ONE TOUCH ULTRA TEST test strip USE TO TEST BLOOD SUGAR FOUR TIMES DAILY E11.65 150 each 5   No current facility-administered medications on file prior to visit.     Allergies:   No Known Allergies   Physical Exam  Vitals:   03/02/18 0959  BP: (!) 145/79  Pulse: 90  Weight: 270 lb 6.4 oz (122.7 kg)   Body mass index is 33.8 kg/m. No exam data present  General: well developed, well nourished, pleasant African-American male, seated, in no evident distress Head: head normocephalic and atraumatic.   Neck: supple with no carotid or supraclavicular bruits Cardiovascular: regular rate and rhythm, no murmurs Musculoskeletal: no deformity Skin:  no rash/petichiae Vascular:  Normal pulses all extremities  Neurologic Exam Mental Status: Awake and fully alert. Oriented to place and time. Recent and remote memory intact. Attention span, concentration and fund of knowledge appropriate. Mood and affect appropriate.  Cranial Nerves: Fundoscopic exam reveals sharp disc margins. Pupils equal, briskly reactive to light. Extraocular movements full without nystagmus. Visual fields full to confrontation. Hearing intact. Facial sensation intact. Face, tongue, palate moves normally and symmetrically.  Motor: Normal bulk and tone.  RUE: 4+/5 with weak grip strength possibly due to swelling and stiffness; LUE: 4/5 with slightly weak grip strength Sensory.: intact to touch ,  pinprick , position and vibratory sensation.  Coordination: Orbits left arm over right arm.  Decreased finger dexterity bilaterally but greater in left hand.  Mild action tremor versus ataxia noted in right distal upper extremity.  Negative for resting tremor. Gait and Station: Arises from chair without difficulty. Stance is normal. Gait demonstrates normal stride length and balance . Able to heel, toe and tandem walk without difficulty.  Reflexes: 1+ and symmetric. Toes downgoing.      Diagnostic Data (Labs, Imaging, Testing)  CT HEAD WO CONTRAST 07/31/2017 IMPRESSION: 1. No acute intracranial pathology seen on CT. 2. Mild cortical volume loss and scattered small vessel ischemic microangiopathy. 3. Opacification of the right side of the sphenoid sinus and partial opacification of the right frontal sinus.  CT ANGIO HEAD W OR WO CONTRAST CT ANGIO NECK W OR WO CONTRAST 07/31/2017 IMPRESSION: 1. Negative for emergent large vessel occlusion. 2. Moderate to advanced left M1 segment stenosis due to eccentric filling defect, possibly symptomatic in this setting. 3. ~60% bilateral cavernous ICA narrowing due to calcified plaque. 4. No flow limiting stenosis in the neck. 5. Presumed meningioma along the left frontal convexity measuring up to 4.3 x 1.3 cm. 6. Left nasal cavity polyp herniating into the nasopharynx. Patchy bilateral sinusitis.  MR BRAIN WO CONTRAST 07/31/2017 IMPRESSION: 1. Scattered, primarily cortical acute infarction along the left cerebral convexity, posterior watershed pattern. 2. Presumed meningioma along the lateral left frontal convexity measuring 4.1 x 1.4 cm. No neighboring brain edema. 3. Multiple small remote cerebellar infarcts. 4. Chronic sinusitis.  Left nasal cavity and nasopharynx polyp  MR BRAIN LIMITED WO CONTRAST 08/02/2017 IMPRESSION: 1. New acute infarction within the left lateral putamen extending into corona radiata. 2.  Multiple small late  acute/early subacute infarctions in the left watershed distribution are stable to minimally increased in size in comparison with the prior MRI of the brain. 3. Interval petechial hemorrhage within the larger focus infarction in the left parietal lobe. 4. Stable left frontal convexity extra-axial mass, presumably meningioma.   ASSESSMENT: KENNER Cortez is a 67 y.o. year old male here with left watershed infarcts with additional new left putamen into corona radiata infarct on 07/31/2017 secondary to most likely thrombotic secondary to left M1 large vessel disease stenosis. Vascular risk factors include CAD, HTN, HLD, DM and px stroke.  Patient is being seen today for 32-month follow-up and continues to have mild dysarthria but has new complaints of bilateral upper extremity intermittent numbness and swelling.  He also continues to have right upper extremity tremors with mild improvement on propranolol.   PLAN: -Continue clopidogrel 75 mg daily  and Lipitor for secondary stroke prevention -Continue propranolol 40 mg twice daily and initiate primidone 50 mg daily for continued tremors.  Advised patient to call after 1 week for possible dosage increase -Repeat CT head to ensure no new infarct due to left hand weakness and new onset of numbness -F/u with PCP/endocrinologist regarding your HLD, HTN DM management -continue to monitor BP at home -Continue to stay active as tolerated and maintain a healthy diet -Maintain strict control of hypertension with blood pressure goal below 130/90, diabetes with hemoglobin A1c goal below 6.5% and cholesterol with LDL cholesterol (bad cholesterol) goal below 70 mg/dL. I also advised the patient to eat a healthy diet with plenty of whole grains, cereals, fruits and vegetables, exercise regularly and maintain ideal body weight.  Follow up in 3 months for follow-up of action tremors or call earlier if needed   Greater than 50% of time during this 25 minute visit  was spent on counseling,explanation of diagnosis of multiple infarcts, reviewing risk factor management of HLD, HTN and DM, planning of further management, discussion with patient and family and coordination of care   Venancio Poisson, [facility]  [facility] [address] [place], [place] [address]  Phone [phone] Fax [phone]

## 2018-03-02 ENCOUNTER — Ambulatory Visit (INDEPENDENT_AMBULATORY_CARE_PROVIDER_SITE_OTHER): Payer: Medicare Other | Admitting: Adult Health

## 2018-03-02 ENCOUNTER — Telehealth: Payer: Self-pay | Admitting: Adult Health

## 2018-03-02 ENCOUNTER — Encounter: Payer: Self-pay | Admitting: Adult Health

## 2018-03-02 VITALS — BP 145/79 | HR 90 | Wt 270.4 lb

## 2018-03-02 DIAGNOSIS — G252 Other specified forms of tremor: Secondary | ICD-10-CM

## 2018-03-02 DIAGNOSIS — E669 Obesity, unspecified: Secondary | ICD-10-CM

## 2018-03-02 DIAGNOSIS — I63512 Cerebral infarction due to unspecified occlusion or stenosis of left middle cerebral artery: Secondary | ICD-10-CM

## 2018-03-02 DIAGNOSIS — R29818 Other symptoms and signs involving the nervous system: Secondary | ICD-10-CM

## 2018-03-02 DIAGNOSIS — E1169 Type 2 diabetes mellitus with other specified complication: Secondary | ICD-10-CM

## 2018-03-02 DIAGNOSIS — E7800 Pure hypercholesterolemia, unspecified: Secondary | ICD-10-CM

## 2018-03-02 DIAGNOSIS — I10 Essential (primary) hypertension: Secondary | ICD-10-CM | POA: Diagnosis not present

## 2018-03-02 MED ORDER — PRIMIDONE 50 MG PO TABS: 50.0000 mg | ORAL_TABLET | Freq: Four times a day (QID) | ORAL | 3 refills | Status: DC

## 2018-03-02 MED ORDER — PROPRANOLOL HCL 40 MG PO TABS: 40.0000 mg | ORAL_TABLET | Freq: Two times a day (BID) | ORAL | 3 refills | Status: DC

## 2018-03-02 NOTE — Telephone Encounter (Signed)
Medicare/BCBS fed order sent to GI lvm for pt to be aware. Left GI phone number of [phone] and to give them a call if he has not heard from the them in the next 2-3 business days.

## 2018-03-02 NOTE — Patient Instructions (Signed)
Continue clopidogrel 75 mg daily  and lipitor  for secondary stroke prevention  Start primidone 50mg  daily - after 1 week if you continue to have tremors, please call office  Continue propranolol 40mg  twice daily for tremors  You will be called to schedule CT head to ensure you have not had a new stroke  Continue to follow up with PCP regarding cholesterol and blood pressure management   Continue to follow up with your endocrinologist for diabetes management  Continue to stay active and maintain a healthy diet  Continue to monitor blood pressure at home  Maintain strict control of hypertension with blood pressure goal below 130/90, diabetes with hemoglobin A1c goal below 6.5% and cholesterol with LDL cholesterol (bad cholesterol) goal below 70 mg/dL. I also advised the patient to eat a healthy diet with plenty of whole grains, cereals, fruits and vegetables, exercise regularly and maintain ideal body weight.  Followup in the future with me in 3 months or call earlier if needed       Thank you for coming to see [place] at [facility]. I hope we have been able to provide you high quality care today.  You may receive a patient satisfaction survey over the next few weeks. We would appreciate your feedback and comments so that we may continue to improve ourselves and the health of our patients.

## 2018-03-06 ENCOUNTER — Ambulatory Visit
Admission: RE | Admit: 2018-03-06 | Discharge: 2018-03-06 | Disposition: A | Discharge status: Home / Self Care | Payer: Medicare Other | Source: Ambulatory Visit | Attending: Adult Health | Admitting: Adult Health

## 2018-03-06 ENCOUNTER — Telehealth: Payer: Self-pay | Admitting: Adult Health

## 2018-03-06 DIAGNOSIS — R29818 Other symptoms and signs involving the nervous system: Secondary | ICD-10-CM | POA: Diagnosis not present

## 2018-03-06 NOTE — Telephone Encounter (Signed)
Rn call patient about having issues with primidone. Pt stated he took the dosage for the first time on Saturday.He took it 4 times per daily. PT stated it made his blood pressure high ,and he felt dizzy. He could not go to church on Sunday. Rn stated message will be sent to [facility] NP. Pt verbalized understanding.

## 2018-03-06 NOTE — Telephone Encounter (Signed)
Left message for patient to call back about Janett Billow NP recommendations of primodone. Pt needs to stop taking due to side effects.

## 2018-03-06 NOTE — Telephone Encounter (Signed)
Patient states he stopped primidone (MYSOLINE) 50 MG tablet yesterday because it was making his BP elevated 180/99 and makes him feel bad. Please call and discuss. He uses [facility] in [place].

## 2018-03-06 NOTE — Telephone Encounter (Signed)
Message sent to [facility] NP for review.

## 2018-03-06 NOTE — Telephone Encounter (Signed)
Please advise patient to discontinue use of primidone at this time. If blood pressure decreases, we can consider increasing propranolol dose at that time.

## 2018-03-09 NOTE — Telephone Encounter (Signed)
Spoke with Alex Cortez and he verbalized understanding the instructions given by Janett Billow. He stated that he would call us back if anything changes. No other questions or concerns at this time.

## 2018-03-10 ENCOUNTER — Telehealth: Payer: Self-pay

## 2018-03-10 NOTE — Telephone Encounter (Signed)
Spoke with the patient and he verbalized understanding his results. No questions or concerns at this time.   

## 2018-03-10 NOTE — Telephone Encounter (Signed)
-----   Message from Venancio Poisson, NP sent at 03/09/2018  5:18 PM EST ----- Please advise patient that his recent CT head did not show any acute infarcts.  As seen on imaging in 07/2017, recent CT head again showed frontal meningioma which remaines stable.  This is most likely a benign mass but it is recommended to repeat imaging in 6 months time to ensure stability.

## 2018-03-19 NOTE — Progress Notes (Signed)
I agree with the above plan 

## 2018-03-29 ENCOUNTER — Encounter: Payer: Self-pay | Admitting: "Endocrinology

## 2018-03-29 DIAGNOSIS — E782 Mixed hyperlipidemia: Secondary | ICD-10-CM | POA: Diagnosis not present

## 2018-03-29 DIAGNOSIS — F5221 Male erectile disorder: Secondary | ICD-10-CM | POA: Diagnosis not present

## 2018-03-29 DIAGNOSIS — R5383 Other fatigue: Secondary | ICD-10-CM | POA: Diagnosis not present

## 2018-03-29 DIAGNOSIS — I10 Essential (primary) hypertension: Secondary | ICD-10-CM | POA: Diagnosis not present

## 2018-03-29 DIAGNOSIS — E1165 Type 2 diabetes mellitus with hyperglycemia: Secondary | ICD-10-CM | POA: Diagnosis not present

## 2018-04-05 DIAGNOSIS — I10 Essential (primary) hypertension: Secondary | ICD-10-CM | POA: Diagnosis not present

## 2018-04-05 DIAGNOSIS — Z6835 Body mass index (BMI) 35.0-35.9, adult: Secondary | ICD-10-CM | POA: Diagnosis not present

## 2018-04-05 DIAGNOSIS — Z23 Encounter for immunization: Secondary | ICD-10-CM | POA: Diagnosis not present

## 2018-04-05 DIAGNOSIS — Z0001 Encounter for general adult medical examination with abnormal findings: Secondary | ICD-10-CM | POA: Diagnosis not present

## 2018-04-06 ENCOUNTER — Other Ambulatory Visit: Payer: Self-pay | Admitting: "Endocrinology

## 2018-04-27 ENCOUNTER — Other Ambulatory Visit: Payer: Self-pay | Admitting: "Endocrinology

## 2018-05-10 DIAGNOSIS — E1159 Type 2 diabetes mellitus with other circulatory complications: Secondary | ICD-10-CM | POA: Diagnosis not present

## 2018-05-10 DIAGNOSIS — E039 Hypothyroidism, unspecified: Secondary | ICD-10-CM | POA: Diagnosis not present

## 2018-05-11 LAB — COMPLETE METABOLIC PANEL WITH GFR
AG Ratio: 1.1 (calc) (ref 1.0–2.5)
ALT: 24 U/L (ref 9–46)
AST: 16 U/L (ref 10–35)
Albumin: 4.1 g/dL (ref 3.6–5.1)
Alkaline phosphatase (APISO): 66 U/L (ref 35–144)
BILIRUBIN TOTAL: 0.8 mg/dL (ref 0.2–1.2)
BUN: 12 mg/dL (ref 7–25)
CHLORIDE: 105 mmol/L (ref 98–110)
CO2: 28 mmol/L (ref 20–32)
Calcium: 9.6 mg/dL (ref 8.6–10.3)
Creat: 1.13 mg/dL (ref 0.70–1.25)
GFR, EST AFRICAN AMERICAN: 77 mL/min/{1.73_m2} (ref >=60)
GFR, Est Non African American: 66 mL/min/{1.73_m2} (ref >=60)
GLUCOSE: 132 mg/dL — AB (ref 65–99)
Globulin: 3.6 g/dL (calc) (ref 1.9–3.7)
Potassium: 4.3 mmol/L (ref 3.5–5.3)
Sodium: 142 mmol/L (ref 135–146)
TOTAL PROTEIN: 7.7 g/dL (ref 6.1–8.1)

## 2018-05-11 LAB — T4, FREE: FREE T4: 1.2 ng/dL (ref 0.8–1.8)

## 2018-05-11 LAB — HEMOGLOBIN A1C
HEMOGLOBIN A1C: 8.7 %{Hb} — AB (ref <5.7)
MEAN PLASMA GLUCOSE: 203 (calc)
eAG (mmol/L): 11.2 (calc)

## 2018-05-11 LAB — TSH: TSH: 2.42 m[IU]/L (ref 0.40–4.50)

## 2018-05-16 ENCOUNTER — Ambulatory Visit (INDEPENDENT_AMBULATORY_CARE_PROVIDER_SITE_OTHER): Payer: Medicare Other | Admitting: "Endocrinology

## 2018-05-16 ENCOUNTER — Encounter: Payer: Self-pay | Admitting: "Endocrinology

## 2018-05-16 ENCOUNTER — Encounter: Payer: Self-pay | Admitting: Nutrition

## 2018-05-16 ENCOUNTER — Encounter: Payer: Medicare Other | Attending: Family Medicine | Admitting: Nutrition

## 2018-05-16 VITALS — BP 168/78 | HR 75 | Resp 12 | Ht 75.0 in | Wt 271.0 lb

## 2018-05-16 DIAGNOSIS — E1165 Type 2 diabetes mellitus with hyperglycemia: Secondary | ICD-10-CM | POA: Insufficient documentation

## 2018-05-16 DIAGNOSIS — E782 Mixed hyperlipidemia: Secondary | ICD-10-CM | POA: Diagnosis not present

## 2018-05-16 DIAGNOSIS — E11649 Type 2 diabetes mellitus with hypoglycemia without coma: Secondary | ICD-10-CM | POA: Insufficient documentation

## 2018-05-16 DIAGNOSIS — E039 Hypothyroidism, unspecified: Secondary | ICD-10-CM

## 2018-05-16 DIAGNOSIS — I10 Essential (primary) hypertension: Secondary | ICD-10-CM | POA: Diagnosis not present

## 2018-05-16 DIAGNOSIS — E6609 Other obesity due to excess calories: Secondary | ICD-10-CM

## 2018-05-16 DIAGNOSIS — E1159 Type 2 diabetes mellitus with other circulatory complications: Secondary | ICD-10-CM

## 2018-05-16 DIAGNOSIS — Z6832 Body mass index (BMI) 32.0-32.9, adult: Secondary | ICD-10-CM | POA: Diagnosis not present

## 2018-05-16 NOTE — Patient Instructions (Signed)

## 2018-05-16 NOTE — Progress Notes (Signed)
  Medical Nutrition Therapy:  Appt start time: 930 end time:  1000  Assessment:  Primary concerns today: Diabetes Type 2. Here with his wife. H/o CVA x 2. A1C down to 8.7%.  Saw Dr. Dorris Fetch today. Wt up 2 lbs.  U500 increased to 60 units a day TID Libre. Metformin 1000 mg BID. Trying to exerise more. He notes his physical endurance has declined and not able to do much without getting winded easily Working on eating smaller portions and avoiding snacks.   Vitals with BMI 11/30/2017  Height 6\' 2"   Weight 261 lbs  BMI 81.8  Systolic 299  Diastolic 82  Pulse 96  Respirations     Lab Results  Component Value Date   HGBA1C 8.7 (H) 05/10/2018     CMP Latest Ref Rng & Units 05/10/2018 12/26/2017 09/20/2017  Glucose 65 - 99 mg/dL 132(H) - -  BUN 7 - 25 mg/dL 12 15 14   Creatinine 0.70 - 1.25 mg/dL 1.13 1.1 1.1  Sodium 135 - 146 mmol/L 142 - -  Potassium 3.5 - 5.3 mmol/L 4.3 - -  Chloride 98 - 110 mmol/L 105 - -  CO2 20 - 32 mmol/L 28 - -  Calcium 8.6 - 10.3 mg/dL 9.6 - -  Total Protein 6.1 - 8.1 g/dL 7.7 - -  Total Bilirubin 0.2 - 1.2 mg/dL 0.8 - -  Alkaline Phos 38 - 126 U/L - - -  AST 10 - 35 U/L 16 - -  ALT 9 - 46 U/L 24 - -    Lab Results  Component Value Date   HGBA1C 8.7 (H) 05/10/2018   Metformin 1000 mg BID.   physical actvity:  ADL and bowls a little.   Preferred Learning Style:   No preference indicated   Learning Readiness:  Ready  Change in progress   MEDICATIONS:   DIETARY INTAKE:  Eating three meals. No snacks.  Drinking more water  Usual physical activity: walking on treadmilll some and recumbent bike.   Estimated energy needs: 1800  calories 200 g carbohydrates 135 g protein 50 g fat  Progress Towards Goal(s):  In progress.   Nutritional Diagnosis:  NB-1.1 Food and nutrition-related knowledge deficit As related to Diabetes.  As evidenced by A1C > 9%.    Intervention:  Nutrition and Diabetes education provided on My Plate, CHO counting,  meal planning, portion sizes. Low Salt Diet and High fiber foods.  Meal planning, target goals for BS. Low salt diet. Benefits of exercise.   Goals Continue to increase exercise as you're able. Continue to increase high fiber and heart healthy foods and avoid processed foods. Lose 2 lbs per month Get A1C down to 7.5% Teaching Method Utilized: Visual Auditory Hands on  Handouts given during visit include:  The Plate Method  Meal Plan Card  Diabetes Instructions.   Barriers to learning/adherence to lifestyle change: none  Demonstrated degree of understanding via:  Teach Back   Monitoring/Evaluation:  Dietary intake, exercise, meal planning, SBG, and body weight in 3 month(s).

## 2018-05-16 NOTE — Progress Notes (Signed)
Endocrinology follow-up note  Subjective:    Patient ID: Alex Cortez, male    DOB: 01-29-1951, PCP Burdine, Virgina Evener, MD   Past Medical History:  Diagnosis Date  . CAD (coronary artery disease)    nonobstructive CAD/patent LAD stent site; EF 65%, 08/2011; NSTEMI/DES LAD, 6/20 11  . Chest pain   . Diabetes mellitus    insulin-dependent  . Dyslipidemia   . HTN (hypertension)   . Post MI syndrome ([place]) 08-29-2009  . Stroke [facility]) 10/22/2012   Left inferior pons   Past Surgical History:  Procedure Laterality Date  . CORONARY ANGIOPLASTY WITH STENT PLACEMENT     Social History   Socioeconomic History  . Marital status: Married    Spouse name: Not on file  . Number of children: Not on file  . Years of education: Not on file  . Highest education level: Not on file  Occupational History  . Not on file  Social Needs  . Financial resource strain: Not on file  . Food insecurity:    Worry: Not on file    Inability: Not on file  . Transportation needs:    Medical: Not on file    Non-medical: Not on file  Tobacco Use  . Smoking status: Former Smoker    Packs/day: 0.50    Years: 23.00    Pack years: 11.50    Types: Cigarettes    Start date: 03/16/1955    Last attempt to quit: 03/15/1988    Years since quitting: 30.1  . Smokeless tobacco: Never Used  Substance and Sexual Activity  . Alcohol use: No    Alcohol/week: 0.0 standard drinks  . Drug use: No  . Sexual activity: Not on file  Lifestyle  . Physical activity:    Days per week: Not on file    Minutes per session: Not on file  . Stress: Not on file  Relationships  . Social connections:    Talks on phone: Patient refused    Gets together: Patient refused    Attends religious service: Patient refused    Active member of club or organization: Patient refused    Attends meetings of clubs or organizations: Patient refused    Relationship status: Patient refused  Other Topics Concern  . Not on file  Social History  Narrative   Works at [employer]. Married.    Outpatient Encounter Medications as of 05/16/2018  Medication Sig  . atorvastatin (LIPITOR) 80 MG tablet Take 1 tablet (80 mg total) by mouth every evening.  . benazepril (LOTENSIN) 40 MG tablet Take 0.5 tablets (20 mg total) by mouth daily.  . clopidogrel (PLAVIX) 75 MG tablet Take 1 tablet (75 mg total) by mouth daily.  . Continuous Blood Gluc Sensor (FREESTYLE LIBRE 14 DAY SENSOR) MISC USE 1 SENSOR EVERY 14 DAYS  . insulin regular human CONCENTRATED (HUMULIN R U-500 KWIKPEN) 500 UNIT/ML kwikpen Inject 60 Units into the skin 3 (three) times daily with meals.  Marland Kitchen levothyroxine (SYNTHROID, LEVOTHROID) 50 MCG tablet TAKE 1 TABLET BY MOUTH DAILY BEFORE BREAKFAST  . metFORMIN (GLUCOPHAGE) 1000 MG tablet Take 500 mg by mouth daily with breakfast.  . nitroGLYCERIN (NITROSTAT) 0.4 MG SL tablet Place 1 tablet (0.4 mg total) under the tongue every 5 (five) minutes as needed.  . ONE TOUCH ULTRA TEST test strip USE TO TEST BLOOD SUGAR FOUR TIMES DAILY E11.65  . primidone (MYSOLINE) 50 MG tablet Take 1 tablet (50 mg total) by mouth 4 (four) times daily.  Marland Kitchen  propranolol (INDERAL) 40 MG tablet Take 1 tablet (40 mg total) by mouth 2 (two) times daily.   No facility-administered encounter medications on file as of 05/16/2018.    ALLERGIES: No Known Allergies VACCINATION STATUS:  There is no immunization history on file for this patient.  Diabetes  He presents for his follow-up diabetic visit. He has type 2 diabetes mellitus. Onset time: He was diagnosed at approximate age of 81 years. His disease course has been improving. There are no hypoglycemic associated symptoms. Pertinent negatives for hypoglycemia include no confusion, headaches, pallor or seizures. Pertinent negatives for diabetes include no chest pain, no fatigue, no polydipsia, no polyphagia, no polyuria and no weakness. There are no hypoglycemic complications. Symptoms are improving. Diabetic  complications include a CVA and nephropathy. Risk factors for coronary artery disease include diabetes mellitus, dyslipidemia, hypertension, male sex, obesity and sedentary lifestyle. Current diabetic treatment includes intensive insulin program. He is compliant with treatment most of the time. His weight is increasing steadily. He is following a generally unhealthy (He consumes a large quantity of soda.) diet. When asked about meal planning, he reported none. He has had a previous visit with a dietitian. He never participates in exercise. His home blood glucose trend is decreasing steadily. His breakfast blood glucose range is generally 180-200 mg/dl. His lunch blood glucose range is generally 180-200 mg/dl. His dinner blood glucose range is generally 180-200 mg/dl. His bedtime blood glucose range is generally 180-200 mg/dl. His overall blood glucose range is 180-200 mg/dl. An ACE inhibitor/angiotensin II receptor blocker is being taken. Eye exam is current.  Hyperlipidemia  This is a chronic problem. The current episode started more than 1 year ago. The problem is controlled. Exacerbating diseases include chronic renal disease, diabetes and obesity. Pertinent negatives include no chest pain, leg pain, myalgias or shortness of breath. Current antihyperlipidemic treatment includes statins. Risk factors for coronary artery disease include diabetes mellitus, dyslipidemia, hypertension, male sex, obesity and a sedentary lifestyle.  Hypertension  This is a chronic problem. The current episode started more than 1 year ago. The problem is controlled. Pertinent negatives include no chest pain, headaches, neck pain, palpitations or shortness of breath. Risk factors for coronary artery disease include obesity, male gender, dyslipidemia, diabetes mellitus, sedentary lifestyle and family history. Past treatments include ACE inhibitors. Hypertensive end-organ damage includes kidney disease and CVA. Identifiable causes of  hypertension include chronic renal disease.    Review of Systems  Constitutional: Negative for fatigue and unexpected weight change.  HENT: Negative for dental problem, mouth sores and trouble swallowing.   Eyes: Negative for visual disturbance.  Respiratory: Negative for cough, choking, chest tightness, shortness of breath and wheezing.   Cardiovascular: Negative for chest pain, palpitations and leg swelling.  Gastrointestinal: Negative for abdominal distention, abdominal pain, constipation, diarrhea, nausea and vomiting.  Endocrine: Negative for polydipsia, polyphagia and polyuria.  Genitourinary: Negative for dysuria, flank pain, hematuria and urgency.  Musculoskeletal: Negative for back pain, gait problem, myalgias and neck pain.  Skin: Negative for pallor, rash and wound.  Neurological: Negative for seizures, syncope, weakness, numbness and headaches.  Psychiatric/Behavioral: Negative.  Negative for confusion and dysphoric mood.    Objective:    BP (!) 168/78   Pulse 75   Resp 12   Ht 6\' 3"  (1.905 m)   Wt 271 lb (122.9 kg)   SpO2 96%   BMI 33.87 kg/m   Wt Readings from Last 3 Encounters:  05/16/18 271 lb (122.9 kg)  03/02/18 270 lb  6.4 oz (122.7 kg)  02/13/18 269 lb 9.6 oz (122.3 kg)    Physical Exam  Constitutional: He is oriented to person, place, and time. He appears well-developed. He is cooperative. No distress.  HENT:  Head: Normocephalic and atraumatic.  Eyes: EOM are normal.  Neck: Normal range of motion. Neck supple. No tracheal deviation present. No thyromegaly present.  Cardiovascular: Normal rate, S1 normal and S2 normal. Exam reveals no gallop.  No murmur heard. Pulses:      Dorsalis pedis pulses are 1+ on the right side and 1+ on the left side.       Posterior tibial pulses are 1+ on the right side and 1+ on the left side.  Pulmonary/Chest: Effort normal. No respiratory distress. He has no wheezes.  Abdominal: He exhibits no distension. There is no  abdominal tenderness. There is no guarding and no CVA tenderness.  Has diffuse lipodystrophy on abdominal skin.  Musculoskeletal:        General: No edema.     Right shoulder: He exhibits no swelling and no deformity.  Neurological: He is alert and oriented to person, place, and time. He has normal strength. No cranial nerve deficit or sensory deficit. Gait normal.  Skin: Skin is warm and dry. No rash noted. No cyanosis. Nails show no clubbing.  Psychiatric: He has a normal mood and affect. His speech is normal. Judgment normal. Cognition and memory are normal.    CMP Latest Ref Rng & Units 05/10/2018 12/26/2017 09/20/2017  Glucose 65 - 99 mg/dL 132(H) - -  BUN 7 - 25 mg/dL 12 15 14   Creatinine 0.70 - 1.25 mg/dL 1.13 1.1 1.1  Sodium 135 - 146 mmol/L 142 - -  Potassium 3.5 - 5.3 mmol/L 4.3 - -  Chloride 98 - 110 mmol/L 105 - -  CO2 20 - 32 mmol/L 28 - -  Calcium 8.6 - 10.3 mg/dL 9.6 - -  Total Protein 6.1 - 8.1 g/dL 7.7 - -  Total Bilirubin 0.2 - 1.2 mg/dL 0.8 - -  Alkaline Phos 38 - 126 U/L - - -  AST 10 - 35 U/L 16 - -  ALT 9 - 46 U/L 24 - -    Recent Results (from the past 2160 hour(s))  Hemoglobin A1c     Status: Abnormal   Collection Time: 05/10/18  7:43 AM  Result Value Ref Range   Hgb A1c MFr Bld 8.7 (H) <5.7 % of total Hgb    Comment: For someone without known diabetes, a hemoglobin A1c value of 6.5% or greater indicates that they may have  diabetes and this should be confirmed with a follow-up  test. . For someone with known diabetes, a value <7% indicates  that their diabetes is well controlled and a value  greater than or equal to 7% indicates suboptimal  control. A1c targets should be individualized based on  duration of diabetes, age, comorbid conditions, and  other considerations. . Currently, no consensus exists regarding use of hemoglobin A1c for diagnosis of diabetes for children. .    Mean Plasma Glucose 203 (calc)   eAG (mmol/L) 11.2 (calc)  COMPLETE  METABOLIC PANEL WITH GFR     Status: Abnormal   Collection Time: 05/10/18  7:43 AM  Result Value Ref Range   Glucose, Bld 132 (H) 65 - 99 mg/dL    Comment: .            Fasting reference interval . For someone without known diabetes, a glucose value >  125 mg/dL indicates that they may have diabetes and this should be confirmed with a follow-up test. .    BUN 12 7 - 25 mg/dL   Creat 1.13 0.70 - 1.25 mg/dL    Comment: For patients >90 years of age, the reference limit for Creatinine is approximately 13% higher for people identified as African-American. .    GFR, Est Non African American 66 > OR = 60 mL/min/1.45m2   GFR, Est African American 77 > OR = 60 mL/min/1.76m2   BUN/Creatinine Ratio NOT APPLICABLE 6 - 22 (calc)   Sodium 142 135 - 146 mmol/L   Potassium 4.3 3.5 - 5.3 mmol/L   Chloride 105 98 - 110 mmol/L   CO2 28 20 - 32 mmol/L   Calcium 9.6 8.6 - 10.3 mg/dL   Total Protein 7.7 6.1 - 8.1 g/dL   Albumin 4.1 3.6 - 5.1 g/dL   Globulin 3.6 1.9 - 3.7 g/dL (calc)   AG Ratio 1.1 1.0 - 2.5 (calc)   Total Bilirubin 0.8 0.2 - 1.2 mg/dL   Alkaline phosphatase (APISO) 66 35 - 144 U/L   AST 16 10 - 35 U/L   ALT 24 9 - 46 U/L  TSH     Status: None   Collection Time: 05/10/18  7:43 AM  Result Value Ref Range   TSH 2.42 0.40 - 4.50 mIU/L  T4, free     Status: None   Collection Time: 05/10/18  7:43 AM  Result Value Ref Range   Free T4 1.2 0.8 - 1.8 ng/dL   Assessment & Plan:   1. Type 2 diabetes mellitus with vascular disease (HCC)  -His  diabetes is complicated by recurrent CVA , coronary artery disease, obesity/sedentary life and patient remains at extremely  high risk for more acute and chronic complications of diabetes which include CAD, CVA, CKD, retinopathy, and neuropathy. These are all discussed in detail with the patient.  -Mr. Bonini comes with his logs showing improved glycemic profile on insulin U500.  His previsit labs show A1c of 8.7% improving from 9.7% during his  last visit.     No documented no reported hypoglycemia.    - Glucose logs and insulin administration records pertaining to this visit,  to be scanned into patient's records.  Recent labs reviewed.  - I have re-counseled the patient on diet management and weight loss  by adopting a carbohydrate restricted / protein rich  Diet.  - Patient admits there is a room for improvement in his diet and drink choices. -  Suggestion is made for him to avoid simple carbohydrates  from his diet including Cakes, Sweet Desserts / Pastries, Ice Cream, Soda (diet and regular), Sweet Tea, Candies, Chips, Cookies, Store Bought Juices, Alcohol in Excess of  1-2 drinks a day, Artificial Sweeteners, and "Sugar-free" Products. This will help patient to have stable blood glucose profile and potentially avoid unintended weight gain.  - Patient is advised to stick to a routine mealtimes to eat 3 meals  a day and avoid unnecessary snacks ( to snack only to correct hypoglycemia).  - I have approached patient with the following individualized plan to manage diabetes and patient agrees.  -He continues to do better on  insulin U500. -He is advised to continue his Humulin U500 60 units 3 times daily before meals for pre-meal blood glucose readings of 90 mg/dL or above.   -I have advised him on how he can rotate insulin injection sites on his abdominal skin. -He is  warned not not to take insulin without proper monitoring of blood glucose.  -He has difficulty keeping his CGM sensor on his skin, advised to use mechanisms including Tegaderm or wrap around his arm to keep the sensor on his skin.  He is advised to  call clinic if he registers blood glucose less than 70 mg/dL or greater than 200 mg/dL. -He will continue to use metformin 500 mg daily at breakfast.  - He reports that he did not qualify for bariatric surgery for Medicare coverage due to the fact that he is BMI is 33.  - Patient specific target  for A1c; LDL, HDL,  Triglycerides, and  Waist Circumference were discussed in detail.  2) BP/HTN:  His blood pressure is controlled to target.  He is advised to continue his current blood pressure medications including benazepril 40 mg p.o. twice daily.   3) Lipids/HPL: His lipid panel is uncontrolled with LDL of 88.    He is advised to continue atorvastatin 80 mg p.o. nightly.  Side effects and precautions discussed with him.   4)  Weight/Diet: Has no success for weight control mainly due to his dietary indiscretion.  He has consulted with CDE.  5) hypothyroidism-new diagnosis for him  His previsit labs show appropriate replacement with levothyroxine.  He is advised to continue levothyroxine 50 mcg p.o. every morning.   - We discussed about the correct intake of his thyroid hormone, on empty stomach at fasting, with water, separated by at least 30 minutes from breakfast and other medications,  and separated by more than 4 hours from calcium, iron, multivitamins, acid reflux medications (PPIs). -Patient is made aware of the fact that thyroid hormone replacement is needed for life, dose to be adjusted by periodic monitoring of thyroid function tests.  6) Chronic Care/Health Maintenance:  -Patient  is  on ACEI/ARB and Statin medications and encouraged to continue to follow up with Ophthalmology, urologist given his recent recurrent CVA, podiatrist at least yearly or according to recommendations, and advised to  stay away from smoking. I have recommended yearly flu vaccine and pneumonia vaccination at least every 5 years; moderate intensity exercise for up to 150 minutes weekly; and  sleep for at least 7 hours a day.  - I advised patient to maintain close follow up with Burdine, Virgina Evener, MD for primary care needs.  - Time spent with the patient: 25 min, of which >50% was spent in reviewing his blood glucose logs , discussing his hypoglycemia and hyperglycemia episodes, reviewing his current and  previous labs /  studies and medications  doses and developing a plan to avoid hypoglycemia and hyperglycemia. Please refer to Patient Instructions for Blood Glucose Monitoring and Insulin/Medications Dosing Guide"  in media tab for additional information. Marvene Staff Puccio participated in the discussions, expressed understanding, and voiced agreement with the above plans.  All questions were answered to his satisfaction. he is encouraged to contact clinic should he have any questions or concerns prior to his return visit.    Follow up plan: -Return in about 4 months (around 09/15/2018) for Follow up with Pre-visit Labs, Meter, and Logs.  Glade Lloyd, MD Phone: [phone]  Fax: [phone]  -  This note was partially dictated with voice recognition software. Similar sounding words can be transcribed inadequately or may not  be corrected upon review.  05/16/2018, 1:25 PM

## 2018-05-16 NOTE — Patient Instructions (Signed)
Goals Continue to increase exercise as you're able. Continue to increase high fiber and heart healthy foods Lose 2 lbs per month Get A1C down to 7.5%

## 2018-05-27 ENCOUNTER — Other Ambulatory Visit: Payer: Self-pay | Admitting: Adult Health

## 2018-05-29 ENCOUNTER — Other Ambulatory Visit: Payer: Self-pay

## 2018-05-31 ENCOUNTER — Telehealth: Payer: Self-pay | Admitting: Cardiovascular Disease

## 2018-05-31 NOTE — Telephone Encounter (Signed)
I called and spoke with the patient and he is doing well from a cardiac standpoint.  He is agreeable to rescheduling his appointment given the [place] pandemic.

## 2018-06-01 ENCOUNTER — Ambulatory Visit: Payer: Medicare Other | Admitting: Adult Health

## 2018-06-01 NOTE — Telephone Encounter (Signed)
Contacted patient today by telephone trying to schedule a follow up appointment. No answer   Patient called to re-schedule appointment.    08-09-2018 @ 840am .

## 2018-06-01 NOTE — Telephone Encounter (Signed)
Called patient. No answer - left message asking patient to return call.

## 2018-06-05 ENCOUNTER — Ambulatory Visit: Payer: Medicare Other | Admitting: Cardiovascular Disease

## 2018-06-06 ENCOUNTER — Other Ambulatory Visit: Payer: Self-pay | Admitting: Adult Health

## 2018-06-13 ENCOUNTER — Other Ambulatory Visit: Payer: Self-pay

## 2018-06-13 MED ORDER — PROPRANOLOL HCL 40 MG PO TABS: 40.0000 mg | ORAL_TABLET | Freq: Two times a day (BID) | ORAL | 6 refills | Status: DC

## 2018-07-04 ENCOUNTER — Telehealth: Payer: Self-pay

## 2018-07-04 NOTE — Telephone Encounter (Signed)
I called pts wife to explain we are doing video visit due to COVID 19. I explain pt was r/s in March due to being high risk. I stated to wife we will need for her to download [email or web address] on her I phone. Also I verified her email in the system which is [email or web address]. I receive verbal consent from wife to do video and to file insurance. She will have her daughter assist her. If pt can download herself she will call the office to notify us.

## 2018-07-05 NOTE — Telephone Encounter (Signed)
Pt wife has called back and asked that the appointment be cancelled at this time.  Wife stated pt will be following up with his primary Dr

## 2018-07-05 NOTE — Telephone Encounter (Signed)
Noted! Thank you

## 2018-07-05 NOTE — Telephone Encounter (Signed)
PT was cancel for March for appt. It was cancel by provider due to [place].   RN set up video with pts wife on 07/04/2018 with details on how to download. The wife stated she will have her daughter assist her with downloading appt.Per the phone room note the wife cancel the video visit with Janett Billow NP on 06/30/2018. The note states pt will follow up with his PCP. Any medications prescribed by Janett Billow NP will have to be manage by primary doctor.

## 2018-07-10 ENCOUNTER — Ambulatory Visit: Payer: Self-pay | Admitting: Adult Health

## 2018-08-01 ENCOUNTER — Other Ambulatory Visit: Payer: Self-pay | Admitting: "Endocrinology

## 2018-08-09 ENCOUNTER — Ambulatory Visit: Payer: Medicare Other | Admitting: Cardiovascular Disease

## 2018-08-30 ENCOUNTER — Other Ambulatory Visit: Payer: Self-pay | Admitting: "Endocrinology

## 2018-09-25 DIAGNOSIS — E1159 Type 2 diabetes mellitus with other circulatory complications: Secondary | ICD-10-CM | POA: Diagnosis not present

## 2018-09-25 DIAGNOSIS — E039 Hypothyroidism, unspecified: Secondary | ICD-10-CM | POA: Diagnosis not present

## 2018-09-26 LAB — COMPLETE METABOLIC PANEL WITH GFR
AG Ratio: 1.1 (calc) (ref 1.0–2.5)
ALT: 30 U/L (ref 9–46)
AST: 17 U/L (ref 10–35)
Albumin: 3.9 g/dL (ref 3.6–5.1)
Alkaline phosphatase (APISO): 69 U/L (ref 35–144)
BUN: 16 mg/dL (ref 7–25)
CO2: 29 mmol/L (ref 20–32)
CREATININE: 1.05 mg/dL (ref 0.70–1.25)
Calcium: 9.1 mg/dL (ref 8.6–10.3)
Chloride: 104 mmol/L (ref 98–110)
GFR, Est African American: 84 mL/min/{1.73_m2} (ref >=60)
GFR, Est Non African American: 73 mL/min/{1.73_m2} (ref >=60)
GLOBULIN: 3.6 g/dL (ref 1.9–3.7)
Glucose, Bld: 227 mg/dL — ABNORMAL HIGH (ref 65–99)
Potassium: 4.6 mmol/L (ref 3.5–5.3)
Sodium: 139 mmol/L (ref 135–146)
Total Bilirubin: 0.7 mg/dL (ref 0.2–1.2)
Total Protein: 7.5 g/dL (ref 6.1–8.1)

## 2018-09-26 LAB — HEMOGLOBIN A1C
Hgb A1c MFr Bld: 9.3 % of total Hgb — ABNORMAL HIGH (ref <5.7)
Mean Plasma Glucose: 220 (calc)
eAG (mmol/L): 12.2 (calc)

## 2018-09-26 LAB — TSH: TSH: 3.36 mIU/L (ref 0.40–4.50)

## 2018-09-26 LAB — T4, FREE: FREE T4: 1 ng/dL (ref 0.8–1.8)

## 2018-09-28 ENCOUNTER — Ambulatory Visit (INDEPENDENT_AMBULATORY_CARE_PROVIDER_SITE_OTHER): Payer: Medicare Other | Admitting: "Endocrinology

## 2018-09-28 ENCOUNTER — Other Ambulatory Visit: Payer: Self-pay

## 2018-09-28 ENCOUNTER — Encounter: Payer: Self-pay | Admitting: "Endocrinology

## 2018-09-28 DIAGNOSIS — E039 Hypothyroidism, unspecified: Secondary | ICD-10-CM

## 2018-09-28 DIAGNOSIS — E782 Mixed hyperlipidemia: Secondary | ICD-10-CM

## 2018-09-28 DIAGNOSIS — E1159 Type 2 diabetes mellitus with other circulatory complications: Secondary | ICD-10-CM

## 2018-09-28 DIAGNOSIS — I10 Essential (primary) hypertension: Secondary | ICD-10-CM

## 2018-09-28 MED ORDER — LEVOTHYROXINE SODIUM 75 MCG PO TABS: 75.0000 ug | ORAL_TABLET | Freq: Every day | ORAL | 3 refills | Status: DC

## 2018-09-28 NOTE — Progress Notes (Signed)
09/28/2018                                                    Endocrinology Telehealth Visit Follow up Note -During COVID -19 Pandemic  This visit type was conducted due to national recommendations for restrictions regarding the COVID-19 Pandemic  in an effort to limit this patient's exposure and mitigate transmission of the corona virus.  Due to his co-morbid illnesses, TAMOTSU WIEDERHOLT is at  moderate to high risk for complications without adequate follow up.  This format is felt to be most appropriate for him at this time.  I connected with this patient on 09/28/2018   by telephone and verified that I am speaking with the correct person using two identifiers. Alex Cortez, January 29, 1951. he has verbally consented to this visit. All issues noted in this document were discussed and addressed. The format was not optimal for physical exam.    Subjective:    Patient ID: Alex Cortez, male    DOB: Oct 30, 1950, PCP Burdine, Virgina Evener, MD   Past Medical History:  Diagnosis Date  . CAD (coronary artery disease)    nonobstructive CAD/patent LAD stent site; EF 65%, 08/2011; NSTEMI/DES LAD, 6/20 11  . Chest pain   . Diabetes mellitus    insulin-dependent  . Dyslipidemia   . HTN (hypertension)   . Post MI syndrome ([place]) 08-29-2009  . Stroke [facility]) 10/22/2012   Left inferior pons   Past Surgical History:  Procedure Laterality Date  . CORONARY ANGIOPLASTY WITH STENT PLACEMENT     Social History   Socioeconomic History  . Marital status: Married    Spouse name: Not on file  . Number of children: Not on file  . Years of education: Not on file  . Highest education level: Not on file  Occupational History  . Not on file  Social Needs  . Financial resource strain: Not on file  . Food insecurity    Worry: Not on file    Inability: Not on file  . Transportation needs    Medical: Not on file    Non-medical: Not on file  Tobacco Use  . Smoking status: Former Smoker    Packs/day: 0.50     Years: 23.00    Pack years: 11.50    Types: Cigarettes    Start date: 03/16/1955    Quit date: 03/15/1988    Years since quitting: 30.5  . Smokeless tobacco: Never Used  Substance and Sexual Activity  . Alcohol use: No    Alcohol/week: 0.0 standard drinks  . Drug use: No  . Sexual activity: Not on file  Lifestyle  . Physical activity    Days per week: Not on file    Minutes per session: Not on file  . Stress: Not on file  Relationships  . Social Herbalist on phone: Patient refused    Gets together: Patient refused    Attends religious service: Patient refused    Active member of club or organization: Patient refused    Attends meetings of clubs or organizations: Patient refused    Relationship status: Patient refused  Other Topics Concern  . Not on file  Social History Narrative   Works at [employer]. Married.    Outpatient Encounter Medications as of 09/28/2018  Medication Sig  .  atorvastatin (LIPITOR) 80 MG tablet Take 1 tablet (80 mg total) by mouth every evening.  . benazepril (LOTENSIN) 40 MG tablet Take 0.5 tablets (20 mg total) by mouth daily.  . clopidogrel (PLAVIX) 75 MG tablet Take 1 tablet (75 mg total) by mouth daily.  . Continuous Blood Gluc Sensor (FREESTYLE LIBRE 14 DAY SENSOR) MISC USE 1 SENSOR EVERY 14 DAYS  . HUMULIN R U-500 KWIKPEN 500 UNIT/ML kwikpen INJECT SUBCUTANEOUSLY 50   UNITS 3 TIMES A DAY WITH   MEALS  . levothyroxine (SYNTHROID) 75 MCG tablet Take 1 tablet (75 mcg total) by mouth daily before breakfast.  . metFORMIN (GLUCOPHAGE) 1000 MG tablet Take 500 mg by mouth daily with breakfast.  . nitroGLYCERIN (NITROSTAT) 0.4 MG SL tablet Place 1 tablet (0.4 mg total) under the tongue every 5 (five) minutes as needed.  . ONE TOUCH ULTRA TEST test strip USE TO TEST BLOOD SUGAR FOUR TIMES DAILY E11.65  . propranolol (INDERAL) 40 MG tablet Take 1 tablet (40 mg total) by mouth 2 (two) times daily.  . [DISCONTINUED] levothyroxine (SYNTHROID) 50 MCG  tablet TAKE 1 TABLET BY MOUTH EVERY DAY BEFORE BREAKFAST   No facility-administered encounter medications on file as of 09/28/2018.    ALLERGIES: No Known Allergies VACCINATION STATUS:  There is no immunization history on file for this patient.  Diabetes He presents for his follow-up diabetic visit. He has type 2 diabetes mellitus. Onset time: He was diagnosed at approximate age of 32 years. His disease course has been worsening. There are no hypoglycemic associated symptoms. Pertinent negatives for hypoglycemia include no confusion, headaches, pallor or seizures. Pertinent negatives for diabetes include no chest pain, no fatigue, no polydipsia, no polyphagia, no polyuria and no weakness. There are no hypoglycemic complications. Symptoms are worsening. Diabetic complications include a CVA and nephropathy. Risk factors for coronary artery disease include diabetes mellitus, dyslipidemia, hypertension, male sex, obesity and sedentary lifestyle. Current diabetic treatment includes intensive insulin program. He is compliant with treatment most of the time. His weight is increasing steadily. He is following a generally unhealthy (He consumes a large quantity of soda.) diet. When asked about meal planning, he reported none. He has had a previous visit with a dietitian. He never participates in exercise. His home blood glucose trend is decreasing steadily. His breakfast blood glucose range is generally >200 mg/dl. His lunch blood glucose range is generally 180-200 mg/dl. His dinner blood glucose range is generally 180-200 mg/dl. His bedtime blood glucose range is generally 180-200 mg/dl. His overall blood glucose range is >200 mg/dl. An ACE inhibitor/angiotensin II receptor blocker is being taken. Eye exam is current.  Hyperlipidemia This is a chronic problem. The current episode started more than 1 year ago. The problem is controlled. Exacerbating diseases include chronic renal disease, diabetes and obesity.  Pertinent negatives include no chest pain, leg pain, myalgias or shortness of breath. Current antihyperlipidemic treatment includes statins. Risk factors for coronary artery disease include diabetes mellitus, dyslipidemia, hypertension, male sex, obesity and a sedentary lifestyle.  Hypertension This is a chronic problem. The current episode started more than 1 year ago. The problem is controlled. Pertinent negatives include no chest pain, headaches, neck pain, palpitations or shortness of breath. Risk factors for coronary artery disease include obesity, male gender, dyslipidemia, diabetes mellitus, sedentary lifestyle and family history. Past treatments include ACE inhibitors. Hypertensive end-organ damage includes kidney disease and CVA. Identifiable causes of hypertension include chronic renal disease.    Review of Systems  Constitutional: Negative for  fatigue and unexpected weight change.  HENT: Negative for dental problem, mouth sores and trouble swallowing.   Eyes: Negative for visual disturbance.  Respiratory: Negative for cough, choking, chest tightness, shortness of breath and wheezing.   Cardiovascular: Negative for chest pain, palpitations and leg swelling.  Gastrointestinal: Negative for abdominal distention, abdominal pain, constipation, diarrhea, nausea and vomiting.  Endocrine: Negative for polydipsia, polyphagia and polyuria.  Genitourinary: Negative for dysuria, flank pain, hematuria and urgency.  Musculoskeletal: Negative for back pain, gait problem, myalgias and neck pain.  Skin: Negative for pallor, rash and wound.  Neurological: Negative for seizures, syncope, weakness, numbness and headaches.  Psychiatric/Behavioral: Negative.  Negative for confusion and dysphoric mood.    Objective:    There were no vitals taken for this visit.  Wt Readings from Last 3 Encounters:  05/16/18 271 lb (122.9 kg)  03/02/18 270 lb 6.4 oz (122.7 kg)  02/13/18 269 lb 9.6 oz (122.3 kg)       CMP Latest Ref Rng & Units 09/25/2018 05/10/2018 12/26/2017  Glucose 65 - 99 mg/dL 227(H) 132(H) -  BUN 7 - 25 mg/dL 16 12 15   Creatinine 0.70 - 1.25 mg/dL 1.05 1.13 1.1  Sodium 135 - 146 mmol/L 139 142 -  Potassium 3.5 - 5.3 mmol/L 4.6 4.3 -  Chloride 98 - 110 mmol/L 104 105 -  CO2 20 - 32 mmol/L 29 28 -  Calcium 8.6 - 10.3 mg/dL 9.1 9.6 -  Total Protein 6.1 - 8.1 g/dL 7.5 7.7 -  Total Bilirubin 0.2 - 1.2 mg/dL 0.7 0.8 -  Alkaline Phos 38 - 126 U/L - - -  AST 10 - 35 U/L 17 16 -  ALT 9 - 46 U/L 30 24 -    Recent Results (from the past 2160 hour(s))  Hemoglobin A1c     Status: Abnormal   Collection Time: 09/25/18  7:31 AM  Result Value Ref Range   Hgb A1c MFr Bld 9.3 (H) <5.7 % of total Hgb    Comment: For someone without known diabetes, a hemoglobin A1c value of 6.5% or greater indicates that they may have  diabetes and this should be confirmed with a follow-up  test. . For someone with known diabetes, a value <7% indicates  that their diabetes is well controlled and a value  greater than or equal to 7% indicates suboptimal  control. A1c targets should be individualized based on  duration of diabetes, age, comorbid conditions, and  other considerations. . Currently, no consensus exists regarding use of hemoglobin A1c for diagnosis of diabetes for children. .    Mean Plasma Glucose 220 (calc)   eAG (mmol/L) 12.2 (calc)  COMPLETE METABOLIC PANEL WITH GFR     Status: Abnormal   Collection Time: 09/25/18  7:31 AM  Result Value Ref Range   Glucose, Bld 227 (H) 65 - 99 mg/dL    Comment: .            Fasting reference interval . For someone without known diabetes, a glucose value >125 mg/dL indicates that they may have diabetes and this should be confirmed with a follow-up test. .    BUN 16 7 - 25 mg/dL   Creat 1.05 0.70 - 1.25 mg/dL    Comment: For patients >35 years of age, the reference limit for Creatinine is approximately 13% higher for people identified as  African-American. .    GFR, Est Non African American 73 > OR = 60 mL/min/1.41m2   GFR, Est African American  84 > OR = 60 mL/min/1.37m2   BUN/Creatinine Ratio NOT APPLICABLE 6 - 22 (calc)   Sodium 139 135 - 146 mmol/L   Potassium 4.6 3.5 - 5.3 mmol/L   Chloride 104 98 - 110 mmol/L   CO2 29 20 - 32 mmol/L   Calcium 9.1 8.6 - 10.3 mg/dL   Total Protein 7.5 6.1 - 8.1 g/dL   Albumin 3.9 3.6 - 5.1 g/dL   Globulin 3.6 1.9 - 3.7 g/dL (calc)   AG Ratio 1.1 1.0 - 2.5 (calc)   Total Bilirubin 0.7 0.2 - 1.2 mg/dL   Alkaline phosphatase (APISO) 69 35 - 144 U/L   AST 17 10 - 35 U/L   ALT 30 9 - 46 U/L  TSH     Status: None   Collection Time: 09/25/18  7:31 AM  Result Value Ref Range   TSH 3.36 0.40 - 4.50 mIU/L  T4, free     Status: None   Collection Time: 09/25/18  7:31 AM  Result Value Ref Range   Free T4 1.0 0.8 - 1.8 ng/dL   Assessment & Plan:   1. Type 2 diabetes mellitus with vascular disease (HCC)  -His  diabetes is complicated by recurrent CVA , coronary artery disease, obesity/sedentary life and patient remains at extremely  high risk for more acute and chronic complications of diabetes which include CAD, CVA, CKD, retinopathy, and neuropathy. These are all discussed in detail with the patient.  -Mr. Bonner reports significantly above target glycemic profile patient has not been his previsit labs show that A1c of 9.3% increasing from 8.7%.     No documented no reported hypoglycemia.    - Glucose logs and insulin administration records pertaining to this visit,  to be scanned into patient's records.  Recent labs reviewed.  - I have re-counseled the patient on diet management and weight loss  by adopting a carbohydrate restricted / protein rich  Diet.  - he  admits there is a room for improvement in his diet and drink choices. -  Suggestion is made for him to avoid simple carbohydrates  from his diet including Cakes, Sweet Desserts / Pastries, Ice Cream, Soda (diet and regular),  Sweet Tea, Candies, Chips, Cookies, Sweet Pastries,  Store Bought Juices, Alcohol in Excess of  1-2 drinks a day, Artificial Sweeteners, Coffee Creamer, and "Sugar-free" Products. This will help patient to have stable blood glucose profile and potentially avoid unintended weight gain.   - Patient is advised to stick to a routine mealtimes to eat 3 meals  a day and avoid unnecessary snacks ( to snack only to correct hypoglycemia).  - I have approached patient with the following individualized plan to manage diabetes and patient agrees.  -He continues to do better on  insulin U500. -He is advised to continue his Humulin U500 60 units prebreakfast, 60 units prelunch, and increase to 70 units presupper  for pre-meal blood glucose readings of 90 mg/dL or above.   -I have advised him on how he can rotate insulin injection sites on his abdominal skin. -He is warned not not to take insulin without proper monitoring of blood glucose.  -He has difficulty keeping his CGM sensor on his skin, advised to use mechanisms including Tegaderm or wrap around his arm to keep the sensor on his skin.  He is advised to  call clinic if he registers blood glucose less than 70 mg/dL or greater than 200 mg/dL. -He will continue to use metformin 500 mg daily  at breakfast.  - He reports that he did not qualify for bariatric surgery for Medicare coverage due to the fact that he is BMI is 33.  - Patient specific target  for A1c; LDL, HDL, Triglycerides, and  Waist Circumference were discussed in detail.  2) BP/HTN:  he is advised to home monitor blood pressure and report if > 140/90 on 2 separate readings.   He is advised to continue his current blood pressure medications including benazepril 40 mg p.o. twice daily.   3) Lipids/HPL: His lipid panel is uncontrolled with LDL of 88.    He is advised to continue atorvastatin 80 mg p.o. nightly.  Side effects and precautions discussed with him.   4)  Weight/Diet: Has no  success for weight control mainly due to his dietary indiscretion.  He has consulted with CDE.  5) hypothyroidism-new diagnosis for him  He will benefit from a higher dose of levothyroxine.  I discussed increase his levothyroxine to 75 mcg p.o. every morning   - We discussed about the correct intake of his thyroid hormone, on empty stomach at fasting, with water, separated by at least 30 minutes from breakfast and other medications,  and separated by more than 4 hours from calcium, iron, multivitamins, acid reflux medications (PPIs). -Patient is made aware of the fact that thyroid hormone replacement is needed for life, dose to be adjusted by periodic monitoring of thyroid function tests.   6) Chronic Care/Health Maintenance:  -Patient  is  on ACEI/ARB and Statin medications and encouraged to continue to follow up with Ophthalmology, urologist given his recent recurrent CVA, podiatrist at least yearly or according to recommendations, and advised to  stay away from smoking. I have recommended yearly flu vaccine and pneumonia vaccination at least every 5 years; moderate intensity exercise for up to 150 minutes weekly; and  sleep for at least 7 hours a day.  - I advised patient to maintain close follow up with Burdine, Virgina Evener, MD for primary care needs. - Patient Care Time Today:  25 min, of which >50% was spent in reviewing his  current and  previous labs/studies, previous treatments, his blood glucose and medications doses and developing a plan for long-term care based on the latest recommendations for standards of care.  Marvene Staff Dauzat participated in the discussions, expressed understanding, and voiced agreement with the above plans.  All questions were answered to his satisfaction. he is encouraged to contact clinic should he have any questions or concerns prior to his return visit.  Follow up plan: -Return in about 4 months (around 01/29/2019), or logs, fax his labs to Dr. Pleas Koch, for  Follow up with Pre-visit Labs, Meter, and Logs.  Glade Lloyd, MD Phone: [phone]  Fax: [phone]  -  This note was partially dictated with voice recognition software. Similar sounding words can be transcribed inadequately or may not  be corrected upon review.  09/28/2018, 11:39 AM

## 2018-10-04 DIAGNOSIS — Z955 Presence of coronary angioplasty implant and graft: Secondary | ICD-10-CM | POA: Diagnosis not present

## 2018-10-04 DIAGNOSIS — I10 Essential (primary) hypertension: Secondary | ICD-10-CM | POA: Diagnosis not present

## 2018-10-04 DIAGNOSIS — R600 Localized edema: Secondary | ICD-10-CM | POA: Diagnosis not present

## 2018-10-04 DIAGNOSIS — Z1331 Encounter for screening for depression: Secondary | ICD-10-CM | POA: Diagnosis not present

## 2018-10-04 DIAGNOSIS — I5032 Chronic diastolic (congestive) heart failure: Secondary | ICD-10-CM | POA: Diagnosis not present

## 2018-10-04 DIAGNOSIS — M7989 Other specified soft tissue disorders: Secondary | ICD-10-CM | POA: Diagnosis not present

## 2018-10-04 DIAGNOSIS — Z6836 Body mass index (BMI) 36.0-36.9, adult: Secondary | ICD-10-CM | POA: Diagnosis not present

## 2018-10-04 DIAGNOSIS — I69851 Hemiplegia and hemiparesis following other cerebrovascular disease affecting right dominant side: Secondary | ICD-10-CM | POA: Diagnosis not present

## 2018-10-04 DIAGNOSIS — E1165 Type 2 diabetes mellitus with hyperglycemia: Secondary | ICD-10-CM | POA: Diagnosis not present

## 2018-10-11 DIAGNOSIS — D329 Benign neoplasm of meninges, unspecified: Secondary | ICD-10-CM | POA: Diagnosis not present

## 2018-10-11 DIAGNOSIS — Z1159 Encounter for screening for other viral diseases: Secondary | ICD-10-CM | POA: Diagnosis not present

## 2018-10-11 DIAGNOSIS — I240 Acute coronary thrombosis not resulting in myocardial infarction: Secondary | ICD-10-CM | POA: Diagnosis not present

## 2018-10-11 DIAGNOSIS — Z6836 Body mass index (BMI) 36.0-36.9, adult: Secondary | ICD-10-CM | POA: Diagnosis not present

## 2018-10-11 DIAGNOSIS — D126 Benign neoplasm of colon, unspecified: Secondary | ICD-10-CM | POA: Diagnosis not present

## 2018-10-11 DIAGNOSIS — R188 Other ascites: Secondary | ICD-10-CM | POA: Diagnosis not present

## 2018-10-11 DIAGNOSIS — E1165 Type 2 diabetes mellitus with hyperglycemia: Secondary | ICD-10-CM | POA: Diagnosis not present

## 2018-10-11 DIAGNOSIS — R600 Localized edema: Secondary | ICD-10-CM | POA: Diagnosis not present

## 2018-10-11 DIAGNOSIS — I10 Essential (primary) hypertension: Secondary | ICD-10-CM | POA: Diagnosis not present

## 2018-10-11 DIAGNOSIS — I69851 Hemiplegia and hemiparesis following other cerebrovascular disease affecting right dominant side: Secondary | ICD-10-CM | POA: Diagnosis not present

## 2018-10-11 DIAGNOSIS — I252 Old myocardial infarction: Secondary | ICD-10-CM | POA: Diagnosis not present

## 2018-10-11 DIAGNOSIS — I5032 Chronic diastolic (congestive) heart failure: Secondary | ICD-10-CM | POA: Diagnosis not present

## 2018-10-11 DIAGNOSIS — G252 Other specified forms of tremor: Secondary | ICD-10-CM | POA: Diagnosis not present

## 2018-10-11 DIAGNOSIS — Z205 Contact with and (suspected) exposure to viral hepatitis: Secondary | ICD-10-CM | POA: Diagnosis not present

## 2018-10-11 DIAGNOSIS — E782 Mixed hyperlipidemia: Secondary | ICD-10-CM | POA: Diagnosis not present

## 2018-10-11 DIAGNOSIS — K439 Ventral hernia without obstruction or gangrene: Secondary | ICD-10-CM | POA: Diagnosis not present

## 2018-10-17 DIAGNOSIS — K439 Ventral hernia without obstruction or gangrene: Secondary | ICD-10-CM | POA: Diagnosis not present

## 2018-10-17 DIAGNOSIS — K760 Fatty (change of) liver, not elsewhere classified: Secondary | ICD-10-CM | POA: Diagnosis not present

## 2018-10-17 DIAGNOSIS — R188 Other ascites: Secondary | ICD-10-CM | POA: Diagnosis not present

## 2018-10-25 DIAGNOSIS — I69851 Hemiplegia and hemiparesis following other cerebrovascular disease affecting right dominant side: Secondary | ICD-10-CM | POA: Diagnosis not present

## 2018-10-25 DIAGNOSIS — I240 Acute coronary thrombosis not resulting in myocardial infarction: Secondary | ICD-10-CM | POA: Diagnosis not present

## 2018-10-25 DIAGNOSIS — I10 Essential (primary) hypertension: Secondary | ICD-10-CM | POA: Diagnosis not present

## 2018-10-25 DIAGNOSIS — E782 Mixed hyperlipidemia: Secondary | ICD-10-CM | POA: Diagnosis not present

## 2018-10-25 DIAGNOSIS — D329 Benign neoplasm of meninges, unspecified: Secondary | ICD-10-CM | POA: Diagnosis not present

## 2018-10-25 DIAGNOSIS — R600 Localized edema: Secondary | ICD-10-CM | POA: Diagnosis not present

## 2018-10-25 DIAGNOSIS — Z6836 Body mass index (BMI) 36.0-36.9, adult: Secondary | ICD-10-CM | POA: Diagnosis not present

## 2018-10-25 DIAGNOSIS — D126 Benign neoplasm of colon, unspecified: Secondary | ICD-10-CM | POA: Diagnosis not present

## 2018-10-31 ENCOUNTER — Telehealth: Payer: Self-pay | Admitting: *Deleted

## 2018-10-31 NOTE — Telephone Encounter (Signed)
Patient verbally consented for telehealth visits with [facility] and understands that his insurance company will be billed for the encounter.   Aware to have vitals available 

## 2018-11-03 ENCOUNTER — Telehealth (INDEPENDENT_AMBULATORY_CARE_PROVIDER_SITE_OTHER): Payer: Medicare Other | Admitting: Cardiovascular Disease

## 2018-11-03 ENCOUNTER — Encounter: Payer: Self-pay | Admitting: Cardiovascular Disease

## 2018-11-03 VITALS — BP 171/88 | HR 76 | Ht 75.0 in | Wt 271.0 lb

## 2018-11-03 DIAGNOSIS — I4729 Other ventricular tachycardia: Secondary | ICD-10-CM

## 2018-11-03 DIAGNOSIS — I25118 Atherosclerotic heart disease of native coronary artery with other forms of angina pectoris: Secondary | ICD-10-CM | POA: Diagnosis not present

## 2018-11-03 DIAGNOSIS — I472 Ventricular tachycardia: Secondary | ICD-10-CM

## 2018-11-03 DIAGNOSIS — I10 Essential (primary) hypertension: Secondary | ICD-10-CM

## 2018-11-03 DIAGNOSIS — E785 Hyperlipidemia, unspecified: Secondary | ICD-10-CM

## 2018-11-03 DIAGNOSIS — I639 Cerebral infarction, unspecified: Secondary | ICD-10-CM

## 2018-11-03 DIAGNOSIS — R0602 Shortness of breath: Secondary | ICD-10-CM

## 2018-11-03 DIAGNOSIS — R600 Localized edema: Secondary | ICD-10-CM

## 2018-11-03 MED ORDER — CARVEDILOL 3.125 MG PO TABS: 3.1250 mg | ORAL_TABLET | Freq: Two times a day (BID) | ORAL | 3 refills | Status: DC

## 2018-11-03 NOTE — Addendum Note (Signed)
Addended by: Merlene Laughter on: 11/03/2018 09:10 AM   Modules accepted: Orders

## 2018-11-03 NOTE — Patient Instructions (Addendum)
Medication Instructions:   Your physician has recommended you make the following change in your medication:   Start carvedilol 3.125 mg by mouth twice daily  Continue all other medications the same  Labwork:  NONE  Testing/Procedures:  NONE  Follow-Up:  Your physician recommends that you schedule a follow-up appointment in: 6 months. You will receive a reminder letter in the mail in about 4 months reminding you to call and schedule your appointment. If you don't receive this letter, please contact our office.  Any Other Special Instructions Will Be Listed Below (If Applicable).  If you need a refill on your cardiac medications before your next appointment, please call your pharmacy.

## 2018-11-03 NOTE — Progress Notes (Signed)
Virtual Visit via Telephone Note   This visit type was conducted due to national recommendations for restrictions regarding the COVID-19 Pandemic (e.g. social distancing) in an effort to limit this patient's exposure and mitigate transmission in our community.  Due to his co-morbid illnesses, this patient is at least at moderate risk for complications without adequate follow up.  This format is felt to be most appropriate for this patient at this time.  The patient did not have access to video technology/had technical difficulties with video requiring transitioning to audio format only (telephone).  All issues noted in this document were discussed and addressed.  No physical exam could be performed with this format.  Please refer to the patient's chart for his  consent to telehealth for [facility].   Date:  11/03/2018   ID:  Alex Cortez, DOB Aug 31, 1950, MRN [id number]  Patient Location: Home Provider Location: Office  PCP:  Curlene Labrum, MD  Cardiologist:  Kate Sable, MD  Electrophysiologist:  None   Evaluation Performed:  Follow-Up Visit  Chief Complaint:  CAD, VT  History of Present Illness:    Alex Cortez is a 68 y.o. male with a history of coronary artery disease with a previously placed mid LAD drug-eluting stent in June 2011. His most recent coronary angiogram was in June 2013 which revealed a widely patent stent. He also has hypertension, hyperlipidemia, CVA in 10/2012 and again in May 2019, and insulin-dependent diabetes mellitus.  Event monitoring reported in November 2019 showed paroxysms of nonsustained ventricular tachycardia.  He told me he underwent LE Dopplers which were negative for DVT about 2 weeks ago. He describes what sounds like an abdominal CT. He said that was normal too.  He had been having bilateral leg swelling for which his PCP prescribed Lasix 40 mg daily x 30 days.  He denies chest pain. He has chronic exertional dyspnea which is  stable. He denies cough and wheezing. He denies fevers. He has hip trouble.    Past Medical History:  Diagnosis Date  . CAD (coronary artery disease)    nonobstructive CAD/patent LAD stent site; EF 65%, 08/2011; NSTEMI/DES LAD, 6/20 11  . Chest pain   . Diabetes mellitus    insulin-dependent  . Dyslipidemia   . HTN (hypertension)   . Post MI syndrome ([place]) 08-29-2009  . Stroke [facility]) 10/22/2012   Left inferior pons   Past Surgical History:  Procedure Laterality Date  . CORONARY ANGIOPLASTY WITH STENT PLACEMENT       Current Meds  Medication Sig  . amLODipine (NORVASC) 10 MG tablet Take 10 mg by mouth daily. for high blood pressure  . atorvastatin (LIPITOR) 80 MG tablet Take 1 tablet (80 mg total) by mouth every evening.  . benazepril (LOTENSIN) 40 MG tablet Take 0.5 tablets (20 mg total) by mouth daily.  . clopidogrel (PLAVIX) 75 MG tablet Take 1 tablet (75 mg total) by mouth daily.  . Continuous Blood Gluc Sensor (FREESTYLE LIBRE 14 DAY SENSOR) MISC USE 1 SENSOR EVERY 14 DAYS  . fluticasone (FLONASE) 50 MCG/ACT nasal spray Place 2 sprays into both nostrils daily.  . furosemide (LASIX) 40 MG tablet Take 40 mg by mouth daily.  Marland Kitchen HUMULIN R U-500 KWIKPEN 500 UNIT/ML kwikpen INJECT SUBCUTANEOUSLY 50   UNITS 3 TIMES A DAY WITH   MEALS  . levothyroxine (SYNTHROID) 75 MCG tablet Take 1 tablet (75 mcg total) by mouth daily before breakfast.  . metFORMIN (GLUCOPHAGE) 1000 MG tablet Take 500  mg by mouth daily with breakfast.  . nitroGLYCERIN (NITROSTAT) 0.4 MG SL tablet Place 1 tablet (0.4 mg total) under the tongue every 5 (five) minutes as needed.  . ONE TOUCH ULTRA TEST test strip USE TO TEST BLOOD SUGAR FOUR TIMES DAILY E11.65     Allergies:   Patient has no known allergies.   Social History   Tobacco Use  . Smoking status: Former Smoker    Packs/day: 0.50    Years: 23.00    Pack years: 11.50    Types: Cigarettes    Start date: 03/16/1955    Quit date: 03/15/1988    Years since  quitting: 30.6  . Smokeless tobacco: Never Used  Substance Use Topics  . Alcohol use: No    Alcohol/week: 0.0 standard drinks  . Drug use: No     Family Hx: The patient's family history includes Hypertension in an other family member.  ROS:   Please see the history of present illness.     All other systems reviewed and are negative.   Prior CV studies:   The following studies were reviewed today:  Nuclear stress test 02/15/2018:   No diagnostic ST segment changes to indicate ischemia.  Small, mild intensity, apical to basal inferior/inferolateral defect that is partially reversible. This is consistent with a mild ischemic territory versus variable diaphragmatic attenuation.  This is an intermediate risk study.  Nuclear stress EF: 44%. Visually, LVEF appears preserved, suggest echocardiogram to confirm.  Echocardiogram 02/20/2018 (limited):  Left ventricle: The cavity size was normal. There was moderate   concentric hypertrophy. Systolic function was normal. The   estimated ejection fraction was in the range of 60% to 65%. LVEF   64.8% by GLS. Wall motion was normal; there were no regional wall   motion abnormalities. Doppler parameters are consistent with   abnormal left ventricular relaxation (grade 1 diastolic   dysfunction). Doppler parameters are consistent with high   ventricular filling pressure. - Aortic valve: Trileaflet; mildly thickened leaflets. - Mitral valve: Mild focal calcification of tip of anterior   leaflet. Mildly calcified annulus.  Labs/Other Tests and Data Reviewed:    EKG:  No ECG reviewed.  Recent Labs: 09/25/2018: ALT 30; BUN 16; Creat 1.05; Potassium 4.6; Sodium 139; TSH 3.36   Recent Lipid Panel Lab Results  Component Value Date/Time   CHOL 143 12/26/2017   TRIG 91 12/26/2017   HDL 37 12/26/2017   CHOLHDL 4.5 08/01/2017 02:43 AM   LDLCALC 88 12/26/2017    Wt Readings from Last 3 Encounters:  11/03/18 271 lb (122.9 kg)  05/16/18  271 lb (122.9 kg)  03/02/18 270 lb 6.4 oz (122.7 kg)     Objective:    Vital Signs:  BP (!) 171/88   Pulse 76   Ht 6\' 3"  (1.905 m)   Wt 271 lb (122.9 kg)   BMI 33.87 kg/m    VITAL SIGNS:  reviewed  ASSESSMENT & PLAN:    1. Coronary artery disease: S/p PCI of the mid-LAD in June 2011. His most recent coronary angiogram in 08/2011 showed a widely patent stent.  Continue Plavix (history of CVA) and atorvastatin. No longer on propranolol as hand tremors essentially resolved.  I will start carvedilol 3.125 mg twice daily. No significant ischemic territories by nuclear stress test in December 2019.  2. Hypertension: Blood pressure is elevated today. I will start carvedilol 3.125 mg twice daily. BP last night was 140/61.  3. Hyperlipidemia: Continue atorvastatin 80 mg.  LDL 88  on 12/26/2017.   4.  Nonsustained ventricular tachycardia: Episodes of nonsustained ventricular tachycardia with cardiac event monitoring.  He had previously been on Coreg and then propranolol, but is not on either of these any longer.  I will start carvedilol 3.125 mg twice daily.  No significant ischemic territories by nuclear stress test in December 2019.  5.  Recurrent CVA: Currently on Plavix and atorvastatin.  No evidence of atrial fibrillation with event monitoring.  6. Bilateral leg edema: He had been having bilateral leg swelling for which his PCP prescribed Lasix 40 mg daily x 30 days. He is now wearing compression stockings.     COVID-19 Education: The signs and symptoms of COVID-19 were discussed with the patient and how to seek care for testing (follow up with PCP or arrange E-visit).  The importance of social distancing was discussed today.  Time:   Today, I have spent 15 minutes with the patient with telehealth technology discussing the above problems.     Medication Adjustments/Labs and Tests Ordered: Current medicines are reviewed at length with the patient today.  Concerns regarding  medicines are outlined above.   Tests Ordered: No orders of the defined types were placed in this encounter.   Medication Changes: No orders of the defined types were placed in this encounter.   Follow Up:  In Person in 6 month(s)  Signed, Kate Sable, MD  11/03/2018 8:50 AM    [place]

## 2018-12-13 ENCOUNTER — Other Ambulatory Visit: Payer: Self-pay | Admitting: Adult Health

## 2018-12-29 ENCOUNTER — Other Ambulatory Visit: Payer: Self-pay | Admitting: "Endocrinology

## 2019-01-19 DIAGNOSIS — E11649 Type 2 diabetes mellitus with hypoglycemia without coma: Secondary | ICD-10-CM | POA: Diagnosis not present

## 2019-01-19 DIAGNOSIS — E1165 Type 2 diabetes mellitus with hyperglycemia: Secondary | ICD-10-CM | POA: Diagnosis not present

## 2019-01-19 DIAGNOSIS — E782 Mixed hyperlipidemia: Secondary | ICD-10-CM | POA: Diagnosis not present

## 2019-01-19 DIAGNOSIS — I5032 Chronic diastolic (congestive) heart failure: Secondary | ICD-10-CM | POA: Diagnosis not present

## 2019-01-19 DIAGNOSIS — R5383 Other fatigue: Secondary | ICD-10-CM | POA: Diagnosis not present

## 2019-01-19 DIAGNOSIS — F5221 Male erectile disorder: Secondary | ICD-10-CM | POA: Diagnosis not present

## 2019-01-19 DIAGNOSIS — I10 Essential (primary) hypertension: Secondary | ICD-10-CM | POA: Diagnosis not present

## 2019-01-19 LAB — LIPID PANEL
Cholesterol: 128 (ref 0–200)
HDL: 36 (ref 35–70)
LDL Cholesterol: 73
Triglycerides: 98 (ref 40–160)

## 2019-01-19 LAB — BASIC METABOLIC PANEL
BUN: 12 (ref 4–21)
Creatinine: 1 (ref 0.6–1.3)

## 2019-01-19 LAB — TSH: TSH: 1.86 (ref 0.41–5.90)

## 2019-01-20 LAB — HEMOGLOBIN A1C: Hemoglobin A1C: 8.7

## 2019-01-24 DIAGNOSIS — I10 Essential (primary) hypertension: Secondary | ICD-10-CM | POA: Diagnosis not present

## 2019-01-24 DIAGNOSIS — I5032 Chronic diastolic (congestive) heart failure: Secondary | ICD-10-CM | POA: Diagnosis not present

## 2019-01-24 DIAGNOSIS — D126 Benign neoplasm of colon, unspecified: Secondary | ICD-10-CM | POA: Diagnosis not present

## 2019-01-24 DIAGNOSIS — D329 Benign neoplasm of meninges, unspecified: Secondary | ICD-10-CM | POA: Diagnosis not present

## 2019-01-24 DIAGNOSIS — R600 Localized edema: Secondary | ICD-10-CM | POA: Diagnosis not present

## 2019-01-24 DIAGNOSIS — Z6838 Body mass index (BMI) 38.0-38.9, adult: Secondary | ICD-10-CM | POA: Diagnosis not present

## 2019-01-24 DIAGNOSIS — I69851 Hemiplegia and hemiparesis following other cerebrovascular disease affecting right dominant side: Secondary | ICD-10-CM | POA: Diagnosis not present

## 2019-01-24 DIAGNOSIS — Z23 Encounter for immunization: Secondary | ICD-10-CM | POA: Diagnosis not present

## 2019-01-25 ENCOUNTER — Other Ambulatory Visit: Payer: Self-pay | Admitting: "Endocrinology

## 2019-02-01 ENCOUNTER — Other Ambulatory Visit: Payer: Self-pay

## 2019-02-01 ENCOUNTER — Ambulatory Visit (INDEPENDENT_AMBULATORY_CARE_PROVIDER_SITE_OTHER): Payer: Medicare Other | Admitting: "Endocrinology

## 2019-02-01 ENCOUNTER — Encounter: Payer: Self-pay | Admitting: "Endocrinology

## 2019-02-01 DIAGNOSIS — I10 Essential (primary) hypertension: Secondary | ICD-10-CM | POA: Diagnosis not present

## 2019-02-01 DIAGNOSIS — E782 Mixed hyperlipidemia: Secondary | ICD-10-CM | POA: Diagnosis not present

## 2019-02-01 DIAGNOSIS — I25118 Atherosclerotic heart disease of native coronary artery with other forms of angina pectoris: Secondary | ICD-10-CM | POA: Diagnosis not present

## 2019-02-01 DIAGNOSIS — E1159 Type 2 diabetes mellitus with other circulatory complications: Secondary | ICD-10-CM | POA: Diagnosis not present

## 2019-02-01 DIAGNOSIS — E039 Hypothyroidism, unspecified: Secondary | ICD-10-CM | POA: Diagnosis not present

## 2019-02-01 NOTE — Progress Notes (Signed)
02/01/2019                                                    Endocrinology Telehealth Visit Follow up Note -During COVID -19 Pandemic  This visit type was conducted due to national recommendations for restrictions regarding the COVID-19 Pandemic  in an effort to limit this patient's exposure and mitigate transmission of the corona virus.  Due to his co-morbid illnesses, ZEE VILL is at  moderate to high risk for complications without adequate follow up.  This format is felt to be most appropriate for him at this time.  I connected with this patient on 02/01/2019   by telephone and verified that I am speaking with the correct person using two identifiers. Alex Cortez, 06/05/1950. he has verbally consented to this visit. All issues noted in this document were discussed and addressed. The format was not optimal for physical exam.    Subjective:    Patient ID: Alex Cortez, male    DOB: April 01, 1950, PCP Burdine, Virgina Evener, MD   Past Medical History:  Diagnosis Date  . CAD (coronary artery disease)    nonobstructive CAD/patent LAD stent site; EF 65%, 08/2011; NSTEMI/DES LAD, 6/20 11  . Chest pain   . Diabetes mellitus    insulin-dependent  . Dyslipidemia   . HTN (hypertension)   . Post MI syndrome ([place]) 08-29-2009  . Stroke [facility]) 10/22/2012   Left inferior pons   Past Surgical History:  Procedure Laterality Date  . CORONARY ANGIOPLASTY WITH STENT PLACEMENT     Social History   Socioeconomic History  . Marital status: Married    Spouse name: Not on file  . Number of children: Not on file  . Years of education: Not on file  . Highest education level: Not on file  Occupational History  . Not on file  Social Needs  . Financial resource strain: Not on file  . Food insecurity    Worry: Not on file    Inability: Not on file  . Transportation needs    Medical: Not on file    Non-medical: Not on file  Tobacco Use  . Smoking status: Former Smoker    Packs/day: 0.50     Years: 23.00    Pack years: 11.50    Types: Cigarettes    Start date: 03/16/1955    Quit date: 03/15/1988    Years since quitting: 30.9  . Smokeless tobacco: Never Used  Substance and Sexual Activity  . Alcohol use: No    Alcohol/week: 0.0 standard drinks  . Drug use: No  . Sexual activity: Not on file  Lifestyle  . Physical activity    Days per week: Not on file    Minutes per session: Not on file  . Stress: Not on file  Relationships  . Social Herbalist on phone: Patient refused    Gets together: Patient refused    Attends religious service: Patient refused    Active member of club or organization: Patient refused    Attends meetings of clubs or organizations: Patient refused    Relationship status: Patient refused  Other Topics Concern  . Not on file  Social History Narrative   Works at [employer]. Married.    Outpatient Encounter Medications as of 02/01/2019  Medication Sig  .  amLODipine (NORVASC) 10 MG tablet Take 10 mg by mouth daily. for high blood pressure  . atorvastatin (LIPITOR) 80 MG tablet Take 1 tablet (80 mg total) by mouth every evening.  . benazepril (LOTENSIN) 40 MG tablet Take 0.5 tablets (20 mg total) by mouth daily.  . carvedilol (COREG) 3.125 MG tablet Take 1 tablet (3.125 mg total) by mouth 2 (two) times daily.  . clopidogrel (PLAVIX) 75 MG tablet Take 1 tablet (75 mg total) by mouth daily.  . Continuous Blood Gluc Sensor (FREESTYLE LIBRE 14 DAY SENSOR) MISC USE AND DISCARD 1 SENSOR   EVERY 14 DAYS  . fluticasone (FLONASE) 50 MCG/ACT nasal spray Place 2 sprays into both nostrils daily.  . furosemide (LASIX) 40 MG tablet Take 40 mg by mouth daily.  Marland Kitchen HUMULIN R U-500 KWIKPEN 500 UNIT/ML kwikpen INJECT SUBCUTANEOUSLY 50   UNITS 3 TIMES A DAY WITH   MEALS  . levothyroxine (SYNTHROID) 75 MCG tablet TAKE 1 TABLET (75 MCG TOTAL) BY MOUTH DAILY BEFORE BREAKFAST.  . metFORMIN (GLUCOPHAGE) 1000 MG tablet Take 500 mg by mouth daily with breakfast.  .  nitroGLYCERIN (NITROSTAT) 0.4 MG SL tablet Place 1 tablet (0.4 mg total) under the tongue every 5 (five) minutes as needed.  . ONE TOUCH ULTRA TEST test strip USE TO TEST BLOOD SUGAR FOUR TIMES DAILY E11.65   No facility-administered encounter medications on file as of 02/01/2019.    ALLERGIES: No Known Allergies VACCINATION STATUS:  There is no immunization history on file for this patient.  Diabetes He presents for his follow-up diabetic visit. He has type 2 diabetes mellitus. Onset time: He was diagnosed at approximate age of 40 years. His disease course has been improving. There are no hypoglycemic associated symptoms. Pertinent negatives for hypoglycemia include no confusion, headaches, pallor or seizures. Pertinent negatives for diabetes include no chest pain, no fatigue, no polydipsia, no polyphagia, no polyuria and no weakness. There are no hypoglycemic complications. Symptoms are improving. Diabetic complications include a CVA and nephropathy. Risk factors for coronary artery disease include diabetes mellitus, dyslipidemia, hypertension, male sex, obesity and sedentary lifestyle. Current diabetic treatment includes intensive insulin program. He is compliant with treatment most of the time. His weight is increasing steadily. He is following a generally unhealthy (He consumes a large quantity of soda.) diet. When asked about meal planning, he reported none. He has had a previous visit with a dietitian. He never participates in exercise. His home blood glucose trend is decreasing steadily. His breakfast blood glucose range is generally 140-180 mg/dl. His lunch blood glucose range is generally 140-180 mg/dl. His dinner blood glucose range is generally 140-180 mg/dl. His bedtime blood glucose range is generally 140-180 mg/dl. His overall blood glucose range is 140-180 mg/dl. An ACE inhibitor/angiotensin II receptor blocker is being taken. Eye exam is current.  Hyperlipidemia This is a chronic  problem. The current episode started more than 1 year ago. The problem is controlled. Exacerbating diseases include chronic renal disease, diabetes and obesity. Pertinent negatives include no chest pain, leg pain, myalgias or shortness of breath. Current antihyperlipidemic treatment includes statins. Risk factors for coronary artery disease include diabetes mellitus, dyslipidemia, hypertension, male sex, obesity and a sedentary lifestyle.  Hypertension This is a chronic problem. The current episode started more than 1 year ago. The problem is controlled. Pertinent negatives include no chest pain, headaches, neck pain, palpitations or shortness of breath. Risk factors for coronary artery disease include obesity, male gender, dyslipidemia, diabetes mellitus, sedentary lifestyle and family  history. Past treatments include ACE inhibitors. Hypertensive end-organ damage includes kidney disease and CVA. Identifiable causes of hypertension include chronic renal disease.   Review of systems: Limited as above.   Objective:    There were no vitals taken for this visit.  Wt Readings from Last 3 Encounters:  11/03/18 271 lb (122.9 kg)  05/16/18 271 lb (122.9 kg)  03/02/18 270 lb 6.4 oz (122.7 kg)      CMP Latest Ref Rng & Units 01/19/2019 09/25/2018 05/10/2018  Glucose 65 - 99 mg/dL - 227(H) 132(H)  BUN 4 - 21 12 16 12   Creatinine 0.6 - 1.3 1.0 1.05 1.13  Sodium 135 - 146 mmol/L - 139 142  Potassium 3.5 - 5.3 mmol/L - 4.6 4.3  Chloride 98 - 110 mmol/L - 104 105  CO2 20 - 32 mmol/L - 29 28  Calcium 8.6 - 10.3 mg/dL - 9.1 9.6  Total Protein 6.1 - 8.1 g/dL - 7.5 7.7  Total Bilirubin 0.2 - 1.2 mg/dL - 0.7 0.8  Alkaline Phos 38 - 126 U/L - - -  AST 10 - 35 U/L - 17 16  ALT 9 - 46 U/L - 30 24    Recent Results (from the past 2160 hour(s))  Basic metabolic panel     Status: None   Collection Time: 01/19/19 12:00 AM  Result Value Ref Range   BUN 12 4 - 21   Creatinine 1.0 0.6 - 1.3  Lipid panel      Status: None   Collection Time: 01/19/19 12:00 AM  Result Value Ref Range   Triglycerides 98 40 - 160   Cholesterol 128 0 - 200   HDL 36 35 - 70   LDL Cholesterol 73   TSH     Status: None   Collection Time: 01/19/19 12:00 AM  Result Value Ref Range   TSH 1.86 0.41 - 5.90  Hemoglobin A1c     Status: None   Collection Time: 01/20/19 12:00 AM  Result Value Ref Range   Hemoglobin A1C 8.7     Assessment & Plan:   1. Type 2 diabetes mellitus with vascular disease (HCC)  -His  diabetes is complicated by recurrent CVA , coronary artery disease, obesity/sedentary life and patient remains at extremely  high risk for more acute and chronic complications of diabetes which include CAD, CVA, CKD, retinopathy, and neuropathy. These are all discussed in detail with the patient.  -Mr. Klute reports significantly better and near target glycemic profile over the last 15 days.  He is previsit labs show A1c of 8.7%, improved from 9.3%.    -No reported or documented major hypoglycemia.  - Glucose logs and insulin administration records pertaining to this visit,  to be scanned into patient's records.  Recent labs reviewed.  - I have re-counseled the patient on diet management and weight loss  by adopting a carbohydrate restricted / protein rich  Diet.  - he  admits there is a room for improvement in his diet and drink choices. -  Suggestion is made for him to avoid simple carbohydrates  from his diet including Cakes, Sweet Desserts / Pastries, Ice Cream, Soda (diet and regular), Sweet Tea, Candies, Chips, Cookies, Sweet Pastries,  Store Bought Juices, Alcohol in Excess of  1-2 drinks a day, Artificial Sweeteners, Coffee Creamer, and "Sugar-free" Products. This will help patient to have stable blood glucose profile and potentially avoid unintended weight gain.   - Patient is advised to stick to a routine mealtimes  to eat 3 meals  a day and avoid unnecessary snacks ( to snack only to correct  hypoglycemia).  - I have approached patient with the following individualized plan to manage diabetes and patient agrees.  -He continues to do better on  insulin U500. -He is advised to continue  Humulin U500 60 units prebreakfast, 60 units prelunch, and increase to 70 units presupper  for pre-meal blood glucose readings of 90 mg/dL or above.   -I have advised him on how he can rotate insulin injection sites on his abdominal skin. -He is warned not not to take insulin without proper monitoring of blood glucose.  -He has difficulty keeping his CGM sensor on his skin, advised to use mechanisms including Tegaderm or wrap around his arm to keep the sensor on his skin.  He is advised to  call clinic if he registers blood glucose less than 70 mg/dL or greater than 200 mg/dL. -He will continue to use metformin 500 mg daily at breakfast.  - He reports that he did not qualify for bariatric surgery for Medicare coverage due to the fact that he is BMI is 33.  - Patient specific target  for A1c; LDL, HDL, Triglycerides, and  Waist Circumference were discussed in detail.  2) BP/HTN:  he is advised to home monitor blood pressure and report if > 140/90 on 2 separate readings.   He is advised to continue his current blood pressure medications including benazepril 40 mg p.o. twice daily.   3) Lipids/HPL: His lipid panel shows controlled LDL 73.    He is advised to continue atorvastatin 80 mg p.o. nightly.  Side effects and precautions discussed with him.   4)  Weight/Diet: Has no success for weight control mainly due to his dietary indiscretion.  He has consulted with CDE.  5) hypothyroidism- His previsit thyroid function tests are consistent with appropriate replacement.  He is advised to continue levothyroxine to 75 mcg p.o. every morning   - We discussed about the correct intake of his thyroid hormone, on empty stomach at fasting, with water, separated by at least 30 minutes from breakfast and other  medications,  and separated by more than 4 hours from calcium, iron, multivitamins, acid reflux medications (PPIs). -Patient is made aware of the fact that thyroid hormone replacement is needed for life, dose to be adjusted by periodic monitoring of thyroid function tests.   6) Chronic Care/Health Maintenance:  -Patient  is  on ACEI/ARB and Statin medications and encouraged to continue to follow up with Ophthalmology, urologist given his recent recurrent CVA, podiatrist at least yearly or according to recommendations, and advised to  stay away from smoking. I have recommended yearly flu vaccine and pneumonia vaccination at least every 5 years; moderate intensity exercise for up to 150 minutes weekly; and  sleep for at least 7 hours a day.  - I advised patient to maintain close follow up with Burdine, Virgina Evener, MD for primary care needs.  - Patient Care Time Today:  25 min, of which >50% was spent in  counseling and the rest reviewing his  current and  previous labs/studies, previous treatments, his blood glucose readings, and medications' doses and developing a plan for long-term care based on the latest recommendations for standards of care.   Marvene Staff Osinski participated in the discussions, expressed understanding, and voiced agreement with the above plans.  All questions were answered to his satisfaction. he is encouraged to contact clinic should he have any questions or  concerns prior to his return visit.  Follow up plan: -Return in about 4 months (around 06/01/2019) for Include 8 log sheets, Bring Meter and Logs- A1c in Office.  Glade Lloyd, MD Phone: [phone]  Fax: [phone]  -  This note was partially dictated with voice recognition software. Similar sounding words can be transcribed inadequately or may not  be corrected upon review.  02/01/2019, 11:44 AM

## 2019-03-26 ENCOUNTER — Other Ambulatory Visit: Payer: Self-pay | Admitting: Adult Health

## 2019-04-13 DIAGNOSIS — I10 Essential (primary) hypertension: Secondary | ICD-10-CM | POA: Diagnosis not present

## 2019-04-13 DIAGNOSIS — E7849 Other hyperlipidemia: Secondary | ICD-10-CM | POA: Diagnosis not present

## 2019-04-18 DIAGNOSIS — E782 Mixed hyperlipidemia: Secondary | ICD-10-CM | POA: Diagnosis not present

## 2019-04-18 DIAGNOSIS — I5032 Chronic diastolic (congestive) heart failure: Secondary | ICD-10-CM | POA: Diagnosis not present

## 2019-04-18 DIAGNOSIS — E1165 Type 2 diabetes mellitus with hyperglycemia: Secondary | ICD-10-CM | POA: Diagnosis not present

## 2019-04-18 DIAGNOSIS — I10 Essential (primary) hypertension: Secondary | ICD-10-CM | POA: Diagnosis not present

## 2019-04-19 DIAGNOSIS — Z23 Encounter for immunization: Secondary | ICD-10-CM | POA: Diagnosis not present

## 2019-04-25 DIAGNOSIS — K760 Fatty (change of) liver, not elsewhere classified: Secondary | ICD-10-CM | POA: Diagnosis not present

## 2019-04-25 DIAGNOSIS — I472 Ventricular tachycardia: Secondary | ICD-10-CM | POA: Diagnosis not present

## 2019-04-25 DIAGNOSIS — Z6836 Body mass index (BMI) 36.0-36.9, adult: Secondary | ICD-10-CM | POA: Diagnosis not present

## 2019-04-25 DIAGNOSIS — I10 Essential (primary) hypertension: Secondary | ICD-10-CM | POA: Diagnosis not present

## 2019-04-25 DIAGNOSIS — R600 Localized edema: Secondary | ICD-10-CM | POA: Diagnosis not present

## 2019-04-25 DIAGNOSIS — I69851 Hemiplegia and hemiparesis following other cerebrovascular disease affecting right dominant side: Secondary | ICD-10-CM | POA: Diagnosis not present

## 2019-04-25 DIAGNOSIS — I5032 Chronic diastolic (congestive) heart failure: Secondary | ICD-10-CM | POA: Diagnosis not present

## 2019-04-25 DIAGNOSIS — I700 Atherosclerosis of aorta: Secondary | ICD-10-CM | POA: Diagnosis not present

## 2019-05-11 DIAGNOSIS — E7849 Other hyperlipidemia: Secondary | ICD-10-CM | POA: Diagnosis not present

## 2019-05-11 DIAGNOSIS — I10 Essential (primary) hypertension: Secondary | ICD-10-CM | POA: Diagnosis not present

## 2019-05-17 DIAGNOSIS — Z23 Encounter for immunization: Secondary | ICD-10-CM | POA: Diagnosis not present

## 2019-05-25 ENCOUNTER — Other Ambulatory Visit: Payer: Self-pay | Admitting: "Endocrinology

## 2019-06-05 ENCOUNTER — Encounter: Payer: Self-pay | Admitting: "Endocrinology

## 2019-06-05 ENCOUNTER — Other Ambulatory Visit: Payer: Self-pay

## 2019-06-05 ENCOUNTER — Ambulatory Visit (INDEPENDENT_AMBULATORY_CARE_PROVIDER_SITE_OTHER): Payer: Medicare Other | Admitting: "Endocrinology

## 2019-06-05 VITALS — BP 143/79 | HR 89 | Ht 75.0 in | Wt 278.0 lb

## 2019-06-05 DIAGNOSIS — E782 Mixed hyperlipidemia: Secondary | ICD-10-CM | POA: Diagnosis not present

## 2019-06-05 DIAGNOSIS — E1159 Type 2 diabetes mellitus with other circulatory complications: Secondary | ICD-10-CM

## 2019-06-05 DIAGNOSIS — I10 Essential (primary) hypertension: Secondary | ICD-10-CM | POA: Diagnosis not present

## 2019-06-05 LAB — POCT GLYCOSYLATED HEMOGLOBIN (HGB A1C): Hemoglobin A1C: 9.2 % — AB (ref 4.0–5.6)

## 2019-06-05 MED ORDER — GLIPIZIDE ER 5 MG PO TB24: 5.0000 mg | ORAL_TABLET | Freq: Every day | ORAL | 3 refills | Status: DC

## 2019-06-05 MED ORDER — HUMULIN R U-500 KWIKPEN 500 UNIT/ML ~~LOC~~ SOPN: PEN_INJECTOR | SUBCUTANEOUS | 2 refills | Status: DC

## 2019-06-05 NOTE — Progress Notes (Signed)
06/05/2019   Endocrinology follow-up note    Subjective:    Patient ID: Alex Cortez, male    DOB: [birth year], PCP Burdine, Virgina Evener, MD   Past Medical History:  Diagnosis Date  . CAD (coronary artery disease)    nonobstructive CAD/patent LAD stent site; EF 65%, 08/2011; NSTEMI/DES LAD, 6/20 11  . Chest pain   . Diabetes mellitus    insulin-dependent  . Dyslipidemia   . HTN (hypertension)   . Post MI syndrome ([place]) 08-29-2009  . Stroke [facility]) 10/22/2012   Left inferior pons   Past Surgical History:  Procedure Laterality Date  . CORONARY ANGIOPLASTY WITH STENT PLACEMENT     Social History   Socioeconomic History  . Marital status: Married    Spouse name: Not on file  . Number of children: Not on file  . Years of education: Not on file  . Highest education level: Not on file  Occupational History  . Not on file  Tobacco Use  . Smoking status: Former Smoker    Packs/day: 0.50    Years: 23.00    Pack years: 11.50    Types: Cigarettes    Start date: 03/16/1955    Quit date: 03/15/1988    Years since quitting: 31.2  . Smokeless tobacco: Never Used  Substance and Sexual Activity  . Alcohol use: No    Alcohol/week: 0.0 standard drinks  . Drug use: No  . Sexual activity: Not on file  Other Topics Concern  . Not on file  Social History Narrative   Works at [employer]. Married.    Social Determinants of Health   Financial Resource Strain:   . Difficulty of Paying Living Expenses:   Food Insecurity:   . Worried About Charity fundraiser in the Last Year:   . Arboriculturist in the Last Year:   Transportation Needs:   . Film/video editor (Medical):   Marland Kitchen Lack of Transportation (Non-Medical):   Physical Activity:   . Days of Exercise per Week:   . Minutes of Exercise per Session:   Stress:   . Feeling of Stress :   Social Connections:   . Frequency of Communication with Friends and Family:   . Frequency of Social Gatherings with Friends and Family:   .  Attends Religious Services:   . Active Member of Clubs or Organizations:   . Attends Archivist Meetings:   Marland Kitchen Marital Status:    Outpatient Encounter Medications as of 06/05/2019  Medication Sig  . amLODipine (NORVASC) 10 MG tablet Take 10 mg by mouth daily. for high blood pressure  . atorvastatin (LIPITOR) 80 MG tablet Take 1 tablet (80 mg total) by mouth every evening.  . benazepril (LOTENSIN) 40 MG tablet Take 0.5 tablets (20 mg total) by mouth daily.  . carvedilol (COREG) 3.125 MG tablet Take 1 tablet (3.125 mg total) by mouth 2 (two) times daily.  . clopidogrel (PLAVIX) 75 MG tablet Take 1 tablet (75 mg total) by mouth daily.  . Continuous Blood Gluc Sensor (FREESTYLE LIBRE 14 DAY SENSOR) MISC USE AND DISCARD 1 SENSOR   EVERY 14 DAYS  . fluticasone (FLONASE) 50 MCG/ACT nasal spray Place 2 sprays into both nostrils daily.  . furosemide (LASIX) 40 MG tablet Take 40 mg by mouth daily.  Marland Kitchen glipiZIDE (GLUCOTROL XL) 5 MG 24 hr tablet Take 1 tablet (5 mg total) by mouth daily with breakfast.  . insulin regular human CONCENTRATED (HUMULIN R U-500 KWIKPEN)  500 UNIT/ML kwikpen USE AS DIRECTED 70 UNITS WITH BREAKFAST, 60 UNITS WITH LUNCH AND 70 UNITS AT SUPPER FOR PREMEAL BLOOD GLUCOSE READINGS >90  . levothyroxine (SYNTHROID) 75 MCG tablet TAKE 1 TABLET (75 MCG TOTAL) BY MOUTH DAILY BEFORE BREAKFAST.  . metFORMIN (GLUCOPHAGE) 1000 MG tablet Take 500 mg by mouth daily with breakfast.  . nitroGLYCERIN (NITROSTAT) 0.4 MG SL tablet Place 1 tablet (0.4 mg total) under the tongue every 5 (five) minutes as needed.  . ONE TOUCH ULTRA TEST test strip USE TO TEST BLOOD SUGAR FOUR TIMES DAILY E11.65  . [DISCONTINUED] insulin regular human CONCENTRATED (HUMULIN R U-500 KWIKPEN) 500 UNIT/ML kwikpen USE AS DIRECTED 60 UNITS WITH BREAKFAST, 60 UNITS WITH LUNCH AND 70 UNITS AT SUPPER FOR PREMEAL BLOOD GLUCOSE READINGS >90   No facility-administered encounter medications on file as of 06/05/2019.    ALLERGIES: No Known Allergies VACCINATION STATUS:  There is no immunization history on file for this patient.  Diabetes He presents for his follow-up diabetic visit. He has type 2 diabetes mellitus. Onset time: He was diagnosed at approximate age of 46 years. His disease course has been worsening. There are no hypoglycemic associated symptoms. Pertinent negatives for hypoglycemia include no confusion, headaches, pallor or seizures. Pertinent negatives for diabetes include no chest pain, no fatigue, no polydipsia, no polyphagia, no polyuria and no weakness. There are no hypoglycemic complications. Symptoms are worsening. Diabetic complications include a CVA and nephropathy. Risk factors for coronary artery disease include diabetes mellitus, dyslipidemia, hypertension, male sex, obesity and sedentary lifestyle. Current diabetic treatment includes intensive insulin program. He is compliant with treatment most of the time. His weight is increasing steadily. He is following a generally unhealthy (He consumes a large quantity of soda.) diet. When asked about meal planning, he reported none. He has had a previous visit with a dietitian. He never participates in exercise. His home blood glucose trend is increasing steadily. His breakfast blood glucose range is generally 140-180 mg/dl. His lunch blood glucose range is generally 180-200 mg/dl. His dinner blood glucose range is generally 180-200 mg/dl. His bedtime blood glucose range is generally 180-200 mg/dl. His overall blood glucose range is 180-200 mg/dl. (He presents with his CGM device.  Printout and analysis was done for him.  He has 35% time in range, 60% above range.  No hypoglycemia.  Point-of-care A1c was 9.2% increasing from 8.7%.  ) An ACE inhibitor/angiotensin II receptor blocker is being taken. Eye exam is current.  Hyperlipidemia This is a chronic problem. The current episode started more than 1 year ago. The problem is controlled. Exacerbating  diseases include chronic renal disease, diabetes and obesity. Pertinent negatives include no chest pain, leg pain, myalgias or shortness of breath. Current antihyperlipidemic treatment includes statins. Risk factors for coronary artery disease include diabetes mellitus, dyslipidemia, hypertension, male sex, obesity and a sedentary lifestyle.  Hypertension This is a chronic problem. The current episode started more than 1 year ago. The problem is controlled. Pertinent negatives include no chest pain, headaches, neck pain, palpitations or shortness of breath. Risk factors for coronary artery disease include obesity, male gender, dyslipidemia, diabetes mellitus, sedentary lifestyle and family history. Past treatments include ACE inhibitors. Hypertensive end-organ damage includes kidney disease and CVA. Identifiable causes of hypertension include chronic renal disease.     Review of systems  Constitutional: + Minimally fluctuating body weight,  current  Body mass index is 34.75 kg/m. , no fatigue, no subjective hyperthermia, no subjective hypothermia Eyes: no blurry vision, no  xerophthalmia ENT: no sore throat, no nodules palpated in throat, no dysphagia/odynophagia, no hoarseness Cardiovascular: no Chest Pain, no Shortness of Breath, no palpitations, no leg swelling Respiratory: no cough, no shortness of breath Gastrointestinal: no Nausea/Vomiting/Diarhhea Musculoskeletal: no muscle/joint aches Skin: no rashes, no hyperemia Neurological: no tremors, no numbness, no tingling, no dizziness Psychiatric: no depression, no anxiety   Objective:    BP (!) 143/79   Pulse 89   Ht 6\' 3"  (1.905 m)   Wt 278 lb (126.1 kg)   BMI 34.75 kg/m   Wt Readings from Last 3 Encounters:  06/05/19 278 lb (126.1 kg)  11/03/18 271 lb (122.9 kg)  05/16/18 271 lb (122.9 kg)     Physical Exam- Limited  Constitutional:  Body mass index is 34.75 kg/m. , not in acute distress, normal state of mind Eyes:  EOMI, no  exophthalmos Neck: Supple Thyroid: No gross goiter Respiratory: Adequate breathing efforts Musculoskeletal: no gross deformities, strength intact in all four extremities, no gross restriction of joint movements Skin:  no rashes, no hyperemia Neurological: no tremor with outstretched hands,    CMP Latest Ref Rng & Units 01/19/2019 09/25/2018 05/10/2018  Glucose 65 - 99 mg/dL - 227(H) 132(H)  BUN 4 - 21 12 16 12   Creatinine 0.6 - 1.3 1.0 1.05 1.13  Sodium 135 - 146 mmol/L - 139 142  Potassium 3.5 - 5.3 mmol/L - 4.6 4.3  Chloride 98 - 110 mmol/L - 104 105  CO2 20 - 32 mmol/L - 29 28  Calcium 8.6 - 10.3 mg/dL - 9.1 9.6  Total Protein 6.1 - 8.1 g/dL - 7.5 7.7  Total Bilirubin 0.2 - 1.2 mg/dL - 0.7 0.8  Alkaline Phos 38 - 126 U/L - - -  AST 10 - 35 U/L - 17 16  ALT 9 - 46 U/L - 30 24    Recent Results (from the past 2160 hour(s))  HgB A1c     Status: Abnormal   Collection Time: 06/05/19 10:29 AM  Result Value Ref Range   Hemoglobin A1C 9.2 (A) 4.0 - 5.6 %   HbA1c POC (<> result, manual entry)     HbA1c, POC (prediabetic range)     HbA1c, POC (controlled diabetic range)      Assessment & Plan:   1. Type 2 diabetes mellitus with vascular disease (HCC)  -His  diabetes is complicated by recurrent CVA , coronary artery disease, obesity/sedentary life and patient remains at extremely  high risk for more acute and chronic complications of diabetes which include CAD, CVA, CKD, retinopathy, and neuropathy. These are all discussed in detail with the patient.   He presents with his CGM device.  Printout and analysis was done for him.  He has 35% time in range, 60% above range.  No hypoglycemia.  Point-of-care A1c was 9.2% increasing from 8.7%.    -No reported or documented major hypoglycemia.  - Glucose logs and insulin administration records pertaining to this visit,  to be scanned into patient's records.  Recent labs reviewed.  - I have re-counseled the patient on diet management and  weight loss  by adopting a carbohydrate restricted / protein rich  Diet.  - he  admits there is a room for improvement in his diet and drink choices. -  Suggestion is made for him to avoid simple carbohydrates  from his diet including Cakes, Sweet Desserts / Pastries, Ice Cream, Soda (diet and regular), Sweet Tea, Candies, Chips, Cookies, Sweet Pastries,  Store Bought Juices, Alcohol  in Excess of  1-2 drinks a day, Artificial Sweeteners, Coffee Creamer, and "Sugar-free" Products. This will help patient to have stable blood glucose profile and potentially avoid unintended weight gain.   - Patient is advised to stick to a routine mealtimes to eat 3 meals  a day and avoid unnecessary snacks ( to snack only to correct hypoglycemia).  - I have approached patient with the following individualized plan to manage diabetes and patient agrees.  -He continues to do better on  insulin U500. -He is advised to increase his  Humulin U500 to 70 units prebreakfast, 60 units prelunch, and  70 units presupper  for pre-meal blood glucose readings of 90 mg/dL or above.   -I have advised him on how he can rotate insulin injection sites on his abdominal skin. -He is warned not not to take insulin without proper monitoring of blood glucose.  -He has difficulty keeping his CGM sensor on his skin, advised to use mechanisms including Tegaderm or wrap around his arm to keep the sensor on his skin.  He is advised to  call clinic if he registers blood glucose less than 70 mg/dL or greater than 200 mg/dL. -He will continue to use metformin 500 mg daily at breakfast. -He will benefit from low-dose glipizide.  I discussed and added glipizide 5 mg XL p.o. daily at breakfast.  - He reports that he did not qualify for bariatric surgery for Medicare coverage due to the fact that he is BMI is 33.  - Patient specific target  for A1c; LDL, HDL, Triglycerides, and  Waist Circumference were discussed in detail.  2) BP/HTN: His  blood pressure is not controlled to target.   He is advised to continue his current blood pressure medications including benazepril 40 mg p.o. twice daily.   3) Lipids/HPL: His lipid panel shows controlled LDL 73.    He is advised to continue atorvastatin 80 mg p.o. nightly.    Side effects and precautions discussed with him.   4)  Weight/Diet: Has no success for weight control mainly due to his dietary indiscretion.  He has consulted with CDE.  5) hypothyroidism- His previsit thyroid function tests are consistent with appropriate replacement.  He is advised to continue levothyroxine to 75 mcg p.o. every morning   - We discussed about the correct intake of his thyroid hormone, on empty stomach at fasting, with water, separated by at least 30 minutes from breakfast and other medications,  and separated by more than 4 hours from calcium, iron, multivitamins, acid reflux medications (PPIs). -Patient is made aware of the fact that thyroid hormone replacement is needed for life, dose to be adjusted by periodic monitoring of thyroid function tests.   6) Chronic Care/Health Maintenance:  -Patient  is  on ACEI/ARB and Statin medications and encouraged to continue to follow up with Ophthalmology, urologist given his recent recurrent CVA, podiatrist at least yearly or according to recommendations, and advised to  stay away from smoking. I have recommended yearly flu vaccine and pneumonia vaccination at least every 5 years; moderate intensity exercise for up to 150 minutes weekly; and  sleep for at least 7 hours a day.  - I advised patient to maintain close follow up with Burdine, Virgina Evener, MD for primary care needs.  - Time spent on this patient care encounter:  35 min, of which > 50% was spent in  counseling and the rest reviewing his blood glucose logs , discussing his hypoglycemia and hyperglycemia episodes, reviewing  his current and  previous labs / studies  ( including abstraction from other  facilities) and medications  doses and developing a  long term treatment plan and documenting his care.   Please refer to Patient Instructions for Blood Glucose Monitoring and Insulin/Medications Dosing Guide"  in media tab for additional information. Please  also refer to " Patient Self Inventory" in the Media  tab for reviewed elements of pertinent patient history.  Marvene Staff Graw participated in the discussions, expressed understanding, and voiced agreement with the above plans.  All questions were answered to his satisfaction. he is encouraged to contact clinic should he have any questions or concerns prior to his return visit.   Follow up plan: -Return in about 3 months (around 09/05/2019) for Bring Meter and Logs- A1c in Office.  Glade Lloyd, MD Phone: [phone]  Fax: [phone]  -  This note was partially dictated with voice recognition software. Similar sounding words can be transcribed inadequately or may not  be corrected upon review.  06/05/2019, 1:01 PM

## 2019-06-05 NOTE — Patient Instructions (Signed)

## 2019-06-13 DIAGNOSIS — E7849 Other hyperlipidemia: Secondary | ICD-10-CM | POA: Diagnosis not present

## 2019-06-13 DIAGNOSIS — I10 Essential (primary) hypertension: Secondary | ICD-10-CM | POA: Diagnosis not present

## 2019-06-13 DIAGNOSIS — E1165 Type 2 diabetes mellitus with hyperglycemia: Secondary | ICD-10-CM | POA: Diagnosis not present

## 2019-06-21 ENCOUNTER — Encounter: Payer: Self-pay | Admitting: *Deleted

## 2019-06-22 ENCOUNTER — Other Ambulatory Visit: Payer: Self-pay

## 2019-06-22 ENCOUNTER — Ambulatory Visit (INDEPENDENT_AMBULATORY_CARE_PROVIDER_SITE_OTHER): Payer: Medicare Other | Admitting: Cardiovascular Disease

## 2019-06-22 ENCOUNTER — Telehealth: Payer: Self-pay | Admitting: Cardiovascular Disease

## 2019-06-22 ENCOUNTER — Encounter: Payer: Self-pay | Admitting: Cardiovascular Disease

## 2019-06-22 ENCOUNTER — Encounter: Payer: Self-pay | Admitting: *Deleted

## 2019-06-22 VITALS — BP 147/64 | HR 96 | Ht 75.0 in | Wt 277.0 lb

## 2019-06-22 DIAGNOSIS — I10 Essential (primary) hypertension: Secondary | ICD-10-CM

## 2019-06-22 DIAGNOSIS — I25118 Atherosclerotic heart disease of native coronary artery with other forms of angina pectoris: Secondary | ICD-10-CM

## 2019-06-22 DIAGNOSIS — R0600 Dyspnea, unspecified: Secondary | ICD-10-CM

## 2019-06-22 DIAGNOSIS — I472 Ventricular tachycardia: Secondary | ICD-10-CM | POA: Diagnosis not present

## 2019-06-22 DIAGNOSIS — I639 Cerebral infarction, unspecified: Secondary | ICD-10-CM | POA: Diagnosis not present

## 2019-06-22 DIAGNOSIS — R600 Localized edema: Secondary | ICD-10-CM

## 2019-06-22 DIAGNOSIS — I4729 Other ventricular tachycardia: Secondary | ICD-10-CM

## 2019-06-22 DIAGNOSIS — E785 Hyperlipidemia, unspecified: Secondary | ICD-10-CM

## 2019-06-22 DIAGNOSIS — R0609 Other forms of dyspnea: Secondary | ICD-10-CM

## 2019-06-22 MED ORDER — CARVEDILOL 6.25 MG PO TABS: 6.2500 mg | ORAL_TABLET | Freq: Two times a day (BID) | ORAL | 3 refills | Status: DC

## 2019-06-22 MED ORDER — ISOSORBIDE MONONITRATE ER 30 MG PO TB24: 30.0000 mg | ORAL_TABLET | Freq: Every day | ORAL | 6 refills | Status: DC

## 2019-06-22 NOTE — Progress Notes (Signed)
SUBJECTIVE: Alex Cortez is a 69 y.o. male with a history of coronary artery disease with a previously placed mid LAD drug-eluting stent in June 2011. His most recent coronary angiogram was in June 2013 which revealed a widely patent stent (reviewed in detail below). He also has hypertension, hyperlipidemia, CVA in 8/2014and again in May 2019, and insulin-dependent diabetes mellitus.  Event monitoring reported in November 2019 showed paroxysms of nonsustained ventricular tachycardia.  He denies chest pain.  Chronic exertional dyspnea stable since I last spoke with him.  He believes shortness of breath began after his second stroke.  I personally reviewed ECG performed today which demonstrates sinus rhythm with T wave inversions in leads III, aVF, and V4 through V6.  There is an isolated PVC.  I compared this to an ECG performed in May 2019 and T wave inversions are more pronounced.  He can perform activities of daily living if he paces himself.  Review of Systems: As per "subjective", otherwise negative.  No Known Allergies  Current Outpatient Medications  Medication Sig Dispense Refill  . amLODipine (NORVASC) 10 MG tablet Take 10 mg by mouth daily. for high blood pressure    . atorvastatin (LIPITOR) 80 MG tablet Take 1 tablet (80 mg total) by mouth every evening. 90 tablet 3  . benazepril (LOTENSIN) 40 MG tablet Take 0.5 tablets (20 mg total) by mouth daily. 30 tablet 0  . carvedilol (COREG) 3.125 MG tablet Take 1 tablet (3.125 mg total) by mouth 2 (two) times daily. 180 tablet 3  . clopidogrel (PLAVIX) 75 MG tablet Take 1 tablet (75 mg total) by mouth daily. 30 tablet 5  . Continuous Blood Gluc Sensor (FREESTYLE LIBRE 14 DAY SENSOR) MISC USE AND DISCARD 1 SENSOR   EVERY 14 DAYS 6 each 2  . fluticasone (FLONASE) 50 MCG/ACT nasal spray Place 2 sprays into both nostrils daily.    . furosemide (LASIX) 40 MG tablet Take 40 mg by mouth daily.    Marland Kitchen glipiZIDE (GLUCOTROL XL) 5 MG 24  hr tablet Take 1 tablet (5 mg total) by mouth daily with breakfast. 30 tablet 3  . insulin regular human CONCENTRATED (HUMULIN R U-500 KWIKPEN) 500 UNIT/ML kwikpen USE AS DIRECTED 70 UNITS WITH BREAKFAST, 60 UNITS WITH LUNCH AND 70 UNITS AT SUPPER FOR PREMEAL BLOOD GLUCOSE READINGS >90 30 mL 2  . levothyroxine (SYNTHROID) 75 MCG tablet TAKE 1 TABLET (75 MCG TOTAL) BY MOUTH DAILY BEFORE BREAKFAST. 90 tablet 1  . metFORMIN (GLUCOPHAGE) 1000 MG tablet Take 500 mg by mouth daily with breakfast.  3  . nitroGLYCERIN (NITROSTAT) 0.4 MG SL tablet Place 1 tablet (0.4 mg total) under the tongue every 5 (five) minutes as needed. 25 tablet 3  . ONE TOUCH ULTRA TEST test strip USE TO TEST BLOOD SUGAR FOUR TIMES DAILY E11.65 150 each 5   No current facility-administered medications for this visit.    Past Medical History:  Diagnosis Date  . CAD (coronary artery disease)    nonobstructive CAD/patent LAD stent site; EF 65%, 08/2011; NSTEMI/DES LAD, 6/20 11  . Chest pain   . Diabetes mellitus    insulin-dependent  . Dyslipidemia   . HTN (hypertension)   . Post MI syndrome ([place]) 08-29-2009  . Stroke [facility]) 10/22/2012   Left inferior pons    Past Surgical History:  Procedure Laterality Date  . CORONARY ANGIOPLASTY WITH STENT PLACEMENT      Social History   Socioeconomic History  . Marital status:  Married    Spouse name: Not on file  . Number of children: Not on file  . Years of education: Not on file  . Highest education level: Not on file  Occupational History  . Not on file  Tobacco Use  . Smoking status: Former Smoker    Packs/day: 0.50    Years: 23.00    Pack years: 11.50    Types: Cigarettes    Start date: 03/16/1955    Quit date: 03/15/1988    Years since quitting: 31.2  . Smokeless tobacco: Never Used  Substance and Sexual Activity  . Alcohol use: No    Alcohol/week: 0.0 standard drinks  . Drug use: No  . Sexual activity: Not on file  Other Topics Concern  . Not on file  Social  History Narrative   Works at [employer]. Married.    Social Determinants of Health   Financial Resource Strain:   . Difficulty of Paying Living Expenses:   Food Insecurity:   . Worried About Charity fundraiser in the Last Year:   . Arboriculturist in the Last Year:   Transportation Needs:   . Film/video editor (Medical):   Marland Kitchen Lack of Transportation (Non-Medical):   Physical Activity:   . Days of Exercise per Week:   . Minutes of Exercise per Session:   Stress:   . Feeling of Stress :   Social Connections:   . Frequency of Communication with Friends and Family:   . Frequency of Social Gatherings with Friends and Family:   . Attends Religious Services:   . Active Member of Clubs or Organizations:   . Attends Archivist Meetings:   Marland Kitchen Marital Status:   Intimate Partner Violence:   . Fear of Current or Ex-Partner:   . Emotionally Abused:   Marland Kitchen Physically Abused:   . Sexually Abused:      Vitals:   06/22/19 1527  BP: (!) 147/64  Pulse: 96  SpO2: 98%  Weight: 277 lb (125.6 kg)  Height: 6\' 3"  (1.905 m)    Wt Readings from Last 3 Encounters:  06/22/19 277 lb (125.6 kg)  06/05/19 278 lb (126.1 kg)  11/03/18 271 lb (122.9 kg)     PHYSICAL EXAM General: NAD HEENT: Normal. Neck: No JVD, no thyromegaly. Lungs: Clear to auscultation bilaterally with normal respiratory effort. CV: Regular rate and rhythm, normal S1/S2, no S3/S4, no murmur. No pretibial or periankle edema.  No carotid bruit.   Abdomen: Soft, nontender, no distention.  Neurologic: Alert and oriented.  Psych: Normal affect. Skin: Normal. Musculoskeletal: No gross deformities.      Labs: Lab Results  Component Value Date/Time   K 4.6 09/25/2018 07:31 AM   BUN 12 01/19/2019 12:00 AM   CREATININE 1.0 01/19/2019 12:00 AM   CREATININE 1.05 09/25/2018 07:31 AM   ALT 30 09/25/2018 07:31 AM   TSH 1.86 01/19/2019 12:00 AM   TSH 3.36 09/25/2018 07:31 AM   HGB 15.3 07/31/2017 06:01 AM      Lipids: Lab Results  Component Value Date/Time   LDLCALC 73 01/19/2019 12:00 AM   CHOL 128 01/19/2019 12:00 AM   TRIG 98 01/19/2019 12:00 AM   HDL 36 01/19/2019 12:00 AM      Prior CV studies:   The following studies were reviewed today:  Nuclear stress test 02/15/2018:   No diagnostic ST segment changes to indicate ischemia.  Small, mild intensity, apical to basal inferior/inferolateral defect that is partially reversible.  This is consistent with a mild ischemic territory versus variable diaphragmatic attenuation.  This is an intermediate risk study.  Nuclear stress EF: 44%. Visually, LVEF appears preserved, suggest echocardiogram to confirm.  Echocardiogram 02/20/2018 (limited):  Left ventricle: The cavity size was normal. There was moderate concentric hypertrophy. Systolic function was normal. The estimated ejection fraction was in the range of 60% to 65%. LVEF 64.8% by GLS. Wall motion was normal; there were no regional wall motion abnormalities. Doppler parameters are consistent with abnormal left ventricular relaxation (grade 1 diastolic dysfunction). Doppler parameters are consistent with high ventricular filling pressure. - Aortic valve: Trileaflet; mildly thickened leaflets. - Mitral valve: Mild focal calcification of tip of anterior leaflet. Mildly calcified annulus.     Cardiac catheterization 09/03/2011:  Hemodynamics:   RA: 13/11/9 RV: 34/7/9 PCWP: 11/9/8 PA:  28/13/19  Cardiac Output              Thermodilution: 6.0 with index of 2.5             Fick : 5.6 with index of 2.3  Arterial Sat: 94 PA Sat: 65.  LV pressure: 158/19/17 Aortic pressure: 153/90/115  Angiography   Left Main: The left main has minor luminal regularities but no significant stenosis.  Left anterior Descending: The LAD has diffuse mild right allergies. There is a stent in the mid LAD which is widely patent. The distal LAD has minor luminal  irregularities. There is a 40-50% stenosis in the very distal aspect of the LAD.  The first diagonal branch is a fairly large branch. There are minor little regular disease but no significant stenosis. The second diagonal artery is a moderate-sized vessel. There is a 50% focal stenosis.  Left Circumflex: The left circumflex artery is a moderate-sized vessel. There are minor luminal irregularities  Right Coronary Artery: The right coronary artery is large and dominant. There are diffuse irregularities throughout the RCA. There is a 40% stenosis in the distal RCA. This is followed by  a focal 50% stenosis just prior to the bifurcation of 2 posterior lateral branches. This lesion does not appear to obstruct flow.  LV Gram: The left ventricular function is normal. Ejection fraction is roughly 65%.  Complications: No apparent complications Patient did tolerate procedure well.  Conclusions:   1. Mild to moderate coronary artery disease. The stent in the LAD is widely patent. There are no significant irregularities to explain his symptoms. 2. Normal left systolic function. Essentially normal right heart pressures. 3. Moderate to severe hypertension. I suspect that this is the cause of the shortness of breath. He will need better blood pressure control 4. Poorly controlled diabetes. His glucose here in the lab was 329. He's on oral hypoglycemics. He will likely need to be started on insulin. We will defer to his medical Dr.   ASSESSMENT AND PLAN:  1. Coronary artery disease/dyspnea on exertion:S/p PCI of the mid-LAD in June 2011. His most recent coronary angiogram in 08/2011 showed a widely patent stent.  ContinuePlavix (history of CVA) and atorvastatin.   Currently on carvedilol 3.125 mg twice daily. No significant ischemic territories by nuclear stress test in December 2019. However, ECG today shows more pronounced T wave abnormalities when compared to an ECG in May 2019.  Chronic  exertional dyspnea appears to be stable.  I will increase carvedilol to 6.25 mg twice daily.  I will also add Imdur 30 mg daily.  I will obtain a Lexiscan Myoview to assess for significant areas of ischemia.  2. Hypertension:Blood pressure is elevated today.  I will increase carvedilol to 6.25 mg twice daily and add Imdur 30 mg daily.  3. Hyperlipidemia: Continueatorvastatin 80 mg.Goal LDL less than 70.  4. Nonsustained ventricular tachycardia: Episodes of nonsustained ventricular tachycardia with cardiac event monitoring.    Given elevated blood pressure and exertional dyspnea, I will increase carvedilol to 6.25 mg twice daily. No significant ischemic territories by nuclear stress test in December 2019.  5. Recurrent CVA: Currently on Plavix and atorvastatin. No evidence of atrial fibrillation with event monitoring.  6.  Bilateral leg edema: Euvolemic on Lasix 40 mg daily.   Disposition: Follow up 3 months virtual visit   Kate Sable, M.D., F.A.C.C.

## 2019-06-22 NOTE — Telephone Encounter (Signed)
Pre-cert Verification for the following procedure    LEXISCAN    DATE:    07/11/2019  LOCATION:  [facility]

## 2019-06-22 NOTE — Patient Instructions (Signed)
Medication Instructions:   Increase Coreg to 6.25mg  twice a day.  Begin Imdur 30mg  daily.   Continue all other medications.    Labwork: none  Testing/Procedures:  Your physician has requested that you have a lexiscan myoview. For further information please visit [email or web address]. Please follow instruction sheet, as given.  Office will contact with results via phone or letter.    Follow-Up: 3 months   Any Other Special Instructions Will Be Listed Below (If Applicable).  If you need a refill on your cardiac medications before your next appointment, please call your pharmacy.

## 2019-06-22 NOTE — Addendum Note (Signed)
Addended by: Laurine Blazer on: 06/22/2019 03:57 PM   Modules accepted: Orders

## 2019-07-09 ENCOUNTER — Ambulatory Visit (HOSPITAL_BASED_OUTPATIENT_CLINIC_OR_DEPARTMENT_OTHER)
Admission: RE | Admit: 2019-07-09 | Discharge: 2019-07-09 | Disposition: A | Discharge status: Home / Self Care | Payer: Medicare Other | Source: Ambulatory Visit | Attending: Cardiovascular Disease | Admitting: Cardiovascular Disease

## 2019-07-09 ENCOUNTER — Ambulatory Visit (HOSPITAL_COMMUNITY)
Admission: RE | Admit: 2019-07-09 | Discharge: 2019-07-09 | Disposition: A | Discharge status: Home / Self Care | Payer: Medicare Other | Source: Ambulatory Visit | Attending: Cardiovascular Disease | Admitting: Cardiovascular Disease

## 2019-07-09 ENCOUNTER — Other Ambulatory Visit: Payer: Self-pay

## 2019-07-09 DIAGNOSIS — R0600 Dyspnea, unspecified: Secondary | ICD-10-CM | POA: Insufficient documentation

## 2019-07-09 DIAGNOSIS — R0609 Other forms of dyspnea: Secondary | ICD-10-CM

## 2019-07-09 LAB — NM MYOCAR MULTI W/SPECT W/WALL MOTION / EF
LV dias vol: 107 mL (ref 62–150)
LV sys vol: 50 mL
Peak HR: 89 {beats}/min
RATE: 0.67
Rest HR: 75 {beats}/min
SDS: 8
SRS: 1
SSS: 8
TID: 1.01

## 2019-07-09 MED ORDER — REGADENOSON 0.4 MG/5ML IV SOLN
INTRAVENOUS | Status: AC
  Administered 2019-07-09: 0.4 mg via INTRAVENOUS
  Filled 2019-07-09: qty 5

## 2019-07-09 MED ORDER — TECHNETIUM TC 99M TETROFOSMIN IV KIT
10.0000 | PACK | Freq: Once | INTRAVENOUS | Status: AC | PRN
  Administered 2019-07-09: 11 via INTRAVENOUS

## 2019-07-09 MED ORDER — SODIUM CHLORIDE FLUSH 0.9 % IV SOLN
INTRAVENOUS | Status: AC
  Administered 2019-07-09: 10 mL via INTRAVENOUS
  Filled 2019-07-09: qty 10

## 2019-07-09 MED ORDER — TECHNETIUM TC 99M TETROFOSMIN IV KIT
30.0000 | PACK | Freq: Once | INTRAVENOUS | Status: AC | PRN
  Administered 2019-07-09: 12:00:00 30 via INTRAVENOUS

## 2019-07-11 ENCOUNTER — Telehealth: Payer: Self-pay | Admitting: *Deleted

## 2019-07-11 DIAGNOSIS — I10 Essential (primary) hypertension: Secondary | ICD-10-CM | POA: Diagnosis not present

## 2019-07-11 DIAGNOSIS — E7849 Other hyperlipidemia: Secondary | ICD-10-CM | POA: Diagnosis not present

## 2019-07-11 NOTE — Telephone Encounter (Signed)
Alex Cortez, [place]  [id number] AM EDT    Patient notified. Copy to pmd. Follow up scheduled for July with Dr. Bronson Ing.

## 2019-07-11 NOTE — Telephone Encounter (Signed)
-----   Message from Herminio Commons, MD sent at 07/10/2019  4:52 PM EDT ----- Evidence of previous heart attack noted with possible blockage but there is also some shadowing which affects the stress test results.  I will see if medications help improve symptoms which appear to have been stable at last evaluation.

## 2019-07-13 DIAGNOSIS — E782 Mixed hyperlipidemia: Secondary | ICD-10-CM | POA: Diagnosis not present

## 2019-07-13 DIAGNOSIS — I10 Essential (primary) hypertension: Secondary | ICD-10-CM | POA: Diagnosis not present

## 2019-07-13 DIAGNOSIS — I5032 Chronic diastolic (congestive) heart failure: Secondary | ICD-10-CM | POA: Diagnosis not present

## 2019-07-13 DIAGNOSIS — E1165 Type 2 diabetes mellitus with hyperglycemia: Secondary | ICD-10-CM | POA: Diagnosis not present

## 2019-07-18 DIAGNOSIS — Z6837 Body mass index (BMI) 37.0-37.9, adult: Secondary | ICD-10-CM | POA: Diagnosis not present

## 2019-07-18 DIAGNOSIS — Z0001 Encounter for general adult medical examination with abnormal findings: Secondary | ICD-10-CM | POA: Diagnosis not present

## 2019-07-18 DIAGNOSIS — E1165 Type 2 diabetes mellitus with hyperglycemia: Secondary | ICD-10-CM | POA: Diagnosis not present

## 2019-07-18 DIAGNOSIS — Z955 Presence of coronary angioplasty implant and graft: Secondary | ICD-10-CM | POA: Diagnosis not present

## 2019-07-18 DIAGNOSIS — I10 Essential (primary) hypertension: Secondary | ICD-10-CM | POA: Diagnosis not present

## 2019-07-18 DIAGNOSIS — I69851 Hemiplegia and hemiparesis following other cerebrovascular disease affecting right dominant side: Secondary | ICD-10-CM | POA: Diagnosis not present

## 2019-07-18 DIAGNOSIS — I252 Old myocardial infarction: Secondary | ICD-10-CM | POA: Diagnosis not present

## 2019-07-18 DIAGNOSIS — I5032 Chronic diastolic (congestive) heart failure: Secondary | ICD-10-CM | POA: Diagnosis not present

## 2019-07-19 ENCOUNTER — Other Ambulatory Visit: Payer: Self-pay | Admitting: "Endocrinology

## 2019-08-06 ENCOUNTER — Telehealth: Payer: Self-pay | Admitting: "Endocrinology

## 2019-08-06 NOTE — Telephone Encounter (Signed)
Pt is asking for a return call regarding his sugar.

## 2019-08-06 NOTE — Telephone Encounter (Signed)
Left a message requesting a return call to the office. 

## 2019-08-07 NOTE — Telephone Encounter (Signed)
Left a message requesting a return call to the office. 

## 2019-08-08 NOTE — Telephone Encounter (Signed)
Left a message requesting a return call to the office. 

## 2019-08-08 NOTE — Telephone Encounter (Signed)
Pt returning your call

## 2019-08-10 ENCOUNTER — Other Ambulatory Visit: Payer: Self-pay | Admitting: "Endocrinology

## 2019-08-13 DIAGNOSIS — I5032 Chronic diastolic (congestive) heart failure: Secondary | ICD-10-CM | POA: Diagnosis not present

## 2019-08-13 DIAGNOSIS — E7849 Other hyperlipidemia: Secondary | ICD-10-CM | POA: Diagnosis not present

## 2019-08-13 DIAGNOSIS — I110 Hypertensive heart disease with heart failure: Secondary | ICD-10-CM | POA: Diagnosis not present

## 2019-08-13 DIAGNOSIS — E11649 Type 2 diabetes mellitus with hypoglycemia without coma: Secondary | ICD-10-CM | POA: Diagnosis not present

## 2019-08-27 ENCOUNTER — Other Ambulatory Visit: Payer: Self-pay | Admitting: "Endocrinology

## 2019-09-06 ENCOUNTER — Encounter: Payer: Self-pay | Admitting: "Endocrinology

## 2019-09-06 ENCOUNTER — Other Ambulatory Visit: Payer: Self-pay

## 2019-09-06 ENCOUNTER — Ambulatory Visit (INDEPENDENT_AMBULATORY_CARE_PROVIDER_SITE_OTHER): Payer: Medicare Other | Admitting: "Endocrinology

## 2019-09-06 VITALS — BP 158/81 | HR 78 | Ht 75.0 in | Wt 279.0 lb

## 2019-09-06 DIAGNOSIS — E782 Mixed hyperlipidemia: Secondary | ICD-10-CM

## 2019-09-06 DIAGNOSIS — E1159 Type 2 diabetes mellitus with other circulatory complications: Secondary | ICD-10-CM

## 2019-09-06 DIAGNOSIS — I10 Essential (primary) hypertension: Secondary | ICD-10-CM | POA: Diagnosis not present

## 2019-09-06 DIAGNOSIS — E039 Hypothyroidism, unspecified: Secondary | ICD-10-CM

## 2019-09-06 DIAGNOSIS — I25118 Atherosclerotic heart disease of native coronary artery with other forms of angina pectoris: Secondary | ICD-10-CM | POA: Diagnosis not present

## 2019-09-06 LAB — POCT GLYCOSYLATED HEMOGLOBIN (HGB A1C): Hemoglobin A1C: 8.7 % — AB (ref 4.0–5.6)

## 2019-09-06 MED ORDER — BENAZEPRIL HCL 40 MG PO TABS: 40.0000 mg | ORAL_TABLET | Freq: Every day | ORAL | 1 refills | Status: DC

## 2019-09-06 MED ORDER — HUMULIN R U-500 KWIKPEN 500 UNIT/ML ~~LOC~~ SOPN: PEN_INJECTOR | SUBCUTANEOUS | 2 refills | Status: DC

## 2019-09-06 NOTE — Patient Instructions (Signed)

## 2019-09-06 NOTE — Progress Notes (Signed)
09/06/2019   Endocrinology follow-up note    Subjective:    Patient ID: Alex Cortez, male    DOB: 1951-01-13, PCP Pleas Koch Virgina Evener, MD   Past Medical History:  Diagnosis Date   CAD (coronary artery disease)    nonobstructive CAD/patent LAD stent site; EF 65%, 08/2011; NSTEMI/DES LAD, 6/20 11   Chest pain    Diabetes mellitus    insulin-dependent   Dyslipidemia    HTN (hypertension)    Post MI syndrome ([place]) 08-29-2009   Stroke ([place]) 10/22/2012   Left inferior pons   Past Surgical History:  Procedure Laterality Date   CORONARY ANGIOPLASTY WITH STENT PLACEMENT     Social History   Socioeconomic History   Marital status: Married    Spouse name: Not on file   Number of children: Not on file   Years of education: Not on file   Highest education level: Not on file  Occupational History   Not on file  Tobacco Use   Smoking status: Former Smoker    Packs/day: 0.50    Years: 23.00    Pack years: 11.50    Types: Cigarettes    Start date: 03/16/1955    Quit date: 03/15/1988    Years since quitting: 31.4   Smokeless tobacco: Never Used  Vaping Use   Vaping Use: Never used  Substance and Sexual Activity   Alcohol use: No    Alcohol/week: 0.0 standard drinks   Drug use: No   Sexual activity: Not on file  Other Topics Concern   Not on file  Social History Narrative   Works at [employer]. Married.    Social Determinants of Health   Financial Resource Strain:    Difficulty of Paying Living Expenses:   Food Insecurity:    Worried About Charity fundraiser in the Last Year:    Arboriculturist in the Last Year:   Transportation Needs:    Film/video editor (Medical):    Lack of Transportation (Non-Medical):   Physical Activity:    Days of Exercise per Week:    Minutes of Exercise per Session:   Stress:    Feeling of Stress :   Social Connections:    Frequency of Communication with Friends and Family:    Frequency of Social  Gatherings with Friends and Family:    Attends Religious Services:    Active Member of Clubs or Organizations:    Attends Archivist Meetings:    Marital Status:    Outpatient Encounter Medications as of 09/06/2019  Medication Sig   amLODipine (NORVASC) 10 MG tablet Take 10 mg by mouth daily. for high blood pressure   atorvastatin (LIPITOR) 80 MG tablet Take 1 tablet (80 mg total) by mouth every evening.   benazepril (LOTENSIN) 40 MG tablet Take 1 tablet (40 mg total) by mouth daily.   carvedilol (COREG) 6.25 MG tablet Take 1 tablet (6.25 mg total) by mouth 2 (two) times daily.   clopidogrel (PLAVIX) 75 MG tablet Take 1 tablet (75 mg total) by mouth daily.   Continuous Blood Gluc Sensor (FREESTYLE LIBRE 14 DAY SENSOR) MISC USE AND DISCARD 1 SENSOR   EVERY 14 DAYS   fluticasone (FLONASE) 50 MCG/ACT nasal spray Place 2 sprays into both nostrils daily.   furosemide (LASIX) 40 MG tablet Take 40 mg by mouth daily.   glipiZIDE (GLUCOTROL XL) 5 MG 24 hr tablet TAKE 1 TABLET BY MOUTH EVERY DAY WITH BREAKFAST   insulin  regular human CONCENTRATED (HUMULIN R U-500 KWIKPEN) 500 UNIT/ML kwikpen USE AS DIRECTED 70 UNITS WITH BREAKFAST, 50 UNITS WITH LUNCH AND 70 UNITS AT SUPPER FOR PREMEAL BLOOD GLUCOSE READINGS >90   isosorbide mononitrate (IMDUR) 30 MG 24 hr tablet Take 1 tablet (30 mg total) by mouth daily.   levothyroxine (SYNTHROID) 75 MCG tablet TAKE 1 TABLET (75 MCG TOTAL) BY MOUTH DAILY BEFORE BREAKFAST.   metFORMIN (GLUCOPHAGE) 1000 MG tablet Take 500 mg by mouth daily with breakfast.   nitroGLYCERIN (NITROSTAT) 0.4 MG SL tablet Place 1 tablet (0.4 mg total) under the tongue every 5 (five) minutes as needed.   ONE TOUCH ULTRA TEST test strip USE TO TEST BLOOD SUGAR FOUR TIMES DAILY E11.65   [DISCONTINUED] benazepril (LOTENSIN) 40 MG tablet Take 0.5 tablets (20 mg total) by mouth daily.   [DISCONTINUED] insulin regular human CONCENTRATED (HUMULIN R U-500 KWIKPEN) 500  UNIT/ML kwikpen USE AS DIRECTED 70 UNITS WITH BREAKFAST, 60 UNITS WITH LUNCH AND 70 UNITS AT SUPPER FOR PREMEAL BLOOD GLUCOSE READINGS >90   No facility-administered encounter medications on file as of 09/06/2019.   ALLERGIES: No Known Allergies VACCINATION STATUS:  There is no immunization history on file for this patient.  Diabetes He presents for his follow-up diabetic visit. He has type 2 diabetes mellitus. Onset time: He was diagnosed at approximate age of 32 years. His disease course has been improving. There are no hypoglycemic associated symptoms. Pertinent negatives for hypoglycemia include no confusion, headaches, pallor or seizures. Pertinent negatives for diabetes include no chest pain, no fatigue, no polydipsia, no polyphagia, no polyuria and no weakness. There are no hypoglycemic complications. Symptoms are improving. Diabetic complications include a CVA and nephropathy. Risk factors for coronary artery disease include diabetes mellitus, dyslipidemia, hypertension, male sex, obesity and sedentary lifestyle. Current diabetic treatment includes intensive insulin program. He is compliant with treatment most of the time. His weight is fluctuating minimally. He is following a generally unhealthy (He consumes a large quantity of soda.) diet. When asked about meal planning, he reported none. He has had a previous visit with a dietitian. He never participates in exercise. His home blood glucose trend is decreasing steadily. His breakfast blood glucose range is generally 140-180 mg/dl. His lunch blood glucose range is generally 140-180 mg/dl. His dinner blood glucose range is generally 130-140 mg/dl. His bedtime blood glucose range is generally 140-180 mg/dl. His overall blood glucose range is 140-180 mg/dl. (He presents with his CGM device.  Printout and analysis was done for him.  He has 52% time in range which is improving, 45% above range.  He does have rare, mild, mainly presupper hypoglycemia.   His point-of-care A1c is 8 .7% , overall improving.   He is known to have discrepancy between A1c and EAG. ) An ACE inhibitor/angiotensin II receptor blocker is being taken. Eye exam is current.  Hyperlipidemia This is a chronic problem. The current episode started more than 1 year ago. The problem is controlled. Exacerbating diseases include chronic renal disease, diabetes and obesity. Pertinent negatives include no chest pain, leg pain, myalgias or shortness of breath. Current antihyperlipidemic treatment includes statins. Risk factors for coronary artery disease include diabetes mellitus, dyslipidemia, hypertension, male sex, obesity and a sedentary lifestyle.  Hypertension This is a chronic problem. The current episode started more than 1 year ago. The problem is controlled. Pertinent negatives include no chest pain, headaches, neck pain, palpitations or shortness of breath. Risk factors for coronary artery disease include obesity, male gender, dyslipidemia,  diabetes mellitus, sedentary lifestyle and family history. Past treatments include ACE inhibitors. Hypertensive end-organ damage includes kidney disease and CVA. Identifiable causes of hypertension include chronic renal disease.     Review of systems  Constitutional: + Minimally fluctuating body weight,  current  Body mass index is 34.87 kg/m. , no fatigue, no subjective hyperthermia, no subjective hypothermia Eyes: no blurry vision, no xerophthalmia ENT: no sore throat, no nodules palpated in throat, no dysphagia/odynophagia, no hoarseness Cardiovascular: no Chest Pain, no Shortness of Breath, no palpitations, no leg swelling Respiratory: no cough, no shortness of breath Gastrointestinal: no Nausea/Vomiting/Diarhhea Musculoskeletal: no muscle/joint aches Skin: no rashes, no hyperemia Neurological: no tremors, no numbness, no tingling, no dizziness Psychiatric: no depression, no anxiety   Objective:    BP (!) 158/81    Pulse 78     Ht 6\' 3"  (1.905 m)    Wt 279 lb (126.6 kg)    BMI 34.87 kg/m   Wt Readings from Last 3 Encounters:  09/06/19 279 lb (126.6 kg)  06/22/19 277 lb (125.6 kg)  06/05/19 278 lb (126.1 kg)      Physical Exam- Limited  Constitutional:  Body mass index is 34.87 kg/m. , not in acute distress, normal state of mind Eyes:  EOMI, no exophthalmos Neck: Supple  Respiratory: Adequate breathing efforts Musculoskeletal: no gross deformities, strength intact in all four extremities, no gross restriction of joint movements Skin:  no rashes, no hyperemia, + lipodystrophy on abdominal skin Neurological: no tremor with outstretched hands    CMP Latest Ref Rng & Units 01/19/2019 09/25/2018 05/10/2018  Glucose 65 - 99 mg/dL - 227(H) 132(H)  BUN 4 - 21 12 16 12   Creatinine 0.6 - 1.3 1.0 1.05 1.13  Sodium 135 - 146 mmol/L - 139 142  Potassium 3.5 - 5.3 mmol/L - 4.6 4.3  Chloride 98 - 110 mmol/L - 104 105  CO2 20 - 32 mmol/L - 29 28  Calcium 8.6 - 10.3 mg/dL - 9.1 9.6  Total Protein 6.1 - 8.1 g/dL - 7.5 7.7  Total Bilirubin 0.2 - 1.2 mg/dL - 0.7 0.8  Alkaline Phos 38 - 126 U/L - - -  AST 10 - 35 U/L - 17 16  ALT 9 - 46 U/L - 30 24    Recent Results (from the past 2160 hour(s))  NM Myocar Multi W/Spect W/Wall Motion / EF     Status: None   Collection Time: 07/09/19  1:21 PM  Result Value Ref Range   Rest HR 75 bpm   Rest BP 160/81 mmHg   Peak HR 89 bpm   Peak BP 160/81 mmHg   SSS 8    SRS 1    SDS 8    LHR 0.67    TID 1.01    LV sys vol 50 mL   LV dias vol 107 62 - 150 mL  HgB A1c     Status: Abnormal   Collection Time: 09/06/19  9:37 AM  Result Value Ref Range   Hemoglobin A1C 8.7 (A) 4.0 - 5.6 %   HbA1c POC (<> result, manual entry)     HbA1c, POC (prediabetic range)     HbA1c, POC (controlled diabetic range)      Assessment & Plan:   1. Type 2 diabetes mellitus with vascular disease (HCC)  -His  diabetes is complicated by recurrent CVA , coronary artery disease,  obesity/sedentary life and patient remains at extremely  high risk for more acute and chronic complications of  diabetes which include CAD, CVA, CKD, retinopathy, and neuropathy. These are all discussed in detail with the patient.  He presents with his CGM device.  Printout and analysis was done for him.  He has 52% time in range which is improving, 45% above range.  He does have rare, mild, mainly presupper hypoglycemia.  His point-of-care A1c is 8 .7% , overall improving.   He is known to have discrepancy between A1c and EAG.   - Glucose logs and insulin administration records pertaining to this visit,  to be scanned into patient's records.  Recent labs reviewed.  - I have re-counseled the patient on diet management and weight loss  by adopting a carbohydrate restricted / protein rich  Diet.  - he  admits there is a room for improvement in his diet and drink choices. -  Suggestion is made for him to avoid simple carbohydrates  from his diet including Cakes, Sweet Desserts / Pastries, Ice Cream, Soda (diet and regular), Sweet Tea, Candies, Chips, Cookies, Sweet Pastries,  Store Bought Juices, Alcohol in Excess of  1-2 drinks a day, Artificial Sweeteners, Coffee Creamer, and "Sugar-free" Products. This will help patient to have stable blood glucose profile and potentially avoid unintended weight gain.   - Patient is advised to stick to a routine mealtimes to eat 3 meals  a day and avoid unnecessary snacks ( to snack only to correct hypoglycemia).  - I have approached patient with the following individualized plan to manage diabetes and patient agrees.  -He continues to do better on  insulin U500. -He is advised to continue Humulin U500  70 units prebreakfast, lower to 50 at lunch, continue 70 units presupper  for pre-meal blood glucose readings of 90 mg/dL or above.   -I have advised him on how he can rotate insulin injection sites on his abdominal skin. -He is warned not not to take insulin  without proper monitoring of blood glucose.  -He has difficulty keeping his CGM sensor on his skin, advised to use mechanisms including Tegaderm or wrap around his arm to keep the sensor on his skin.  He is advised to  call clinic if he registers blood glucose less than 70 mg/dL or greater than 200 mg/dL. -He will continue to use metformin 500 mg daily at breakfast. -He he is benefiting from low-dose glipizide.  He is advised to continue glipizide 5 mg XL p.o. daily at breakfast.  - He reports that he did not qualify for bariatric surgery for Medicare coverage due to the fact that he is BMI is 33.  If BMI increases to about 35, he will be sent again for reevaluation.  - Patient specific target  for A1c; LDL, HDL, Triglycerides, and  Waist Circumference were discussed in detail.  2) BP/HTN: -His blood pressure is not controlled to target.   He is advised to increase his benazepril to 40 mg p.o. daily.    3) Lipids/HPL: His lipid panel shows controlled LDL 73.    He is advised to continue atorvastatin 80 mg p.o. nightly.    Side effects and precautions discussed with him.   4)  Weight/Diet: His BMI is 34.8, gaining weight.  Has no success for weight control mainly due to his dietary indiscretion.  He has consulted with CDE.  5) hypothyroidism- His previsit thyroid function tests are consistent with appropriate replacement.  He is advised to continue levothyroxine to 75 mcg p.o. every morning   - We discussed about the correct intake of  his thyroid hormone, on empty stomach at fasting, with water, separated by at least 30 minutes from breakfast and other medications,  and separated by more than 4 hours from calcium, iron, multivitamins, acid reflux medications (PPIs). -Patient is made aware of the fact that thyroid hormone replacement is needed for life, dose to be adjusted by periodic monitoring of thyroid function tests.   6) Chronic Care/Health Maintenance:  -Patient  is  on ACEI/ARB and  Statin medications and encouraged to continue to follow up with Ophthalmology, urologist given his recent recurrent CVA, podiatrist at least yearly or according to recommendations, and advised to  stay away from smoking. I have recommended yearly flu vaccine and pneumonia vaccination at least every 5 years; moderate intensity exercise for up to 150 minutes weekly; and  sleep for at least 7 hours a day.  - I advised patient to maintain close follow up with [place], Virgina Evener, MD for primary care needs.  - Time spent on this patient care encounter:  40 min, of which > 50% was spent in  counseling and the rest reviewing his blood glucose logs , discussing his hypoglycemia and hyperglycemia episodes, reviewing his current and  previous labs / studies  ( including abstraction from other facilities) and medications  doses and developing a  long term treatment plan and documenting his care.   Please refer to Patient Instructions for Blood Glucose Monitoring and Insulin/Medications Dosing Guide"  in media tab for additional information. Please  also refer to " Patient Self Inventory" in the Media  tab for reviewed elements of pertinent patient history.  Marvene Staff Franzel participated in the discussions, expressed understanding, and voiced agreement with the above plans.  All questions were answered to his satisfaction. he is encouraged to contact clinic should he have any questions or concerns prior to his return visit.  Follow up plan: -Return in about 4 months (around 01/06/2020) for F/U with Pre-visit Labs, Meter, Logs, A1c here.Glade Lloyd, MD Phone: [phone]  Fax: [phone]  -  This note was partially dictated with voice recognition software. Similar sounding words can be transcribed inadequately or may not  be corrected upon review.  09/06/2019, 12:55 PM

## 2019-09-26 ENCOUNTER — Telehealth: Payer: Medicare Other | Admitting: Cardiovascular Disease

## 2019-09-26 NOTE — Progress Notes (Signed)
Cardiology Office Note  Date: 09/27/2019   ID: Alex Cortez, DOB 23-Dec-1950, MRN [id number]  PCP:  Curlene Labrum, MD  Cardiologist:  Kate Sable, MD Electrophysiologist:  None   Chief Complaint: Follow-up dyspnea on exertion  History of Present Illness: Alex Cortez is a 69 y.o. male with a history of dyspnea on exertion, coronary artery disease (previous mid LAD DES June 2011.  Cardiac catheterization June 2013 which showed a widely patent stent.  History of HTN, HLD, CVA 2014 and 2019, IDDM.  Previous admit monitor number 04/03/2016 with paroxysmal as of NSVT.  Last saw Dr. Bronson Ing on 06/22/2019 he denied any chest pain.  His chronic exertional dyspnea was stable.  EKG showed sinus rhythm with T wave inversions in lead III, aVF, V4 through V6.  EKG was compared to her previous EKG performed in May 2019 showing T wave inversions were more pronounced on current EKG.  Lexiscan stress test was ordered.  Carvedilol was increased to 6.25 mg twice daily.  Imdur 30 mg was added.  Atorvastatin was continued.  Was continued on Plavix due to recurrent CVAs in the past.  Event monitor showed no significant arrhythmias. NST on 07/09/2019 LVEF 55 to 65%.  Findings consistent with prior inferior myocardial infarction with moderate peri-infarct ischemia.  There was significant good tracer uptake adjacent to the distal segment in the stress images which may reflect findings.  Intermediate risk study.  He presents today for 65-month follow-up.  He denies any issues in the interim since last visit.  States he continues with shortness of breath which has been chronic over the last 2 to 3 years.  Denies any worsening of dyspnea.  Denies any anginal symptoms, or associated nausea, vomiting, diaphoresis.  No CVA or TIA-like symptoms.  He does have a history of lacunar infarcts.  Denies any palpitations or arrhythmias, orthostatic symptoms, bleeding in stool or urine.  Denies any PND, orthopnea.  No  claudication-like symptoms, DVT or PE-like symptoms, or lower extremity edema.  Blood pressure is elevated on arrival at 150/64.  Currently taking benazepril 40 mg, carvedilol 6.25 mg p.o. twice daily.  Lasix 20 mg p.o. twice daily, and amlodipine 10 mg daily for hypertension   Past Medical History:  Diagnosis Date  . CAD (coronary artery disease)    nonobstructive CAD/patent LAD stent site; EF 65%, 08/2011; NSTEMI/DES LAD, 6/20 11  . Chest pain   . Diabetes mellitus    insulin-dependent  . Dyslipidemia   . HTN (hypertension)   . Post MI syndrome ([place]) 08-29-2009  . Stroke [facility]) 10/22/2012   Left inferior pons    Past Surgical History:  Procedure Laterality Date  . CORONARY ANGIOPLASTY WITH STENT PLACEMENT      Current Outpatient Medications  Medication Sig Dispense Refill  . carvedilol (COREG) 6.25 MG tablet Take 1 tablet (6.25 mg total) by mouth 2 (two) times daily. 180 tablet 3  . isosorbide mononitrate (IMDUR) 30 MG 24 hr tablet Take 1 tablet (30 mg total) by mouth daily. 30 tablet 6  . amLODipine (NORVASC) 10 MG tablet Take 10 mg by mouth daily. for high blood pressure    . atorvastatin (LIPITOR) 80 MG tablet Take 1 tablet (80 mg total) by mouth every evening. 90 tablet 3  . benazepril (LOTENSIN) 40 MG tablet Take 1 tablet (40 mg total) by mouth daily. 90 tablet 1  . carvedilol (COREG) 3.125 MG tablet Take 3.125 mg by mouth 2 (two) times daily.    Marland Kitchen  clopidogrel (PLAVIX) 75 MG tablet Take 1 tablet (75 mg total) by mouth daily. 30 tablet 5  . Continuous Blood Gluc Sensor (FREESTYLE LIBRE 14 DAY SENSOR) MISC USE AND DISCARD 1 SENSOR   EVERY 14 DAYS 6 each 2  . fluticasone (FLONASE) 50 MCG/ACT nasal spray Place 2 sprays into both nostrils daily.    . furosemide (LASIX) 20 MG tablet Take 20 mg by mouth 2 (two) times daily.    . furosemide (LASIX) 40 MG tablet Take 40 mg by mouth daily.    Marland Kitchen glipiZIDE (GLUCOTROL XL) 5 MG 24 hr tablet TAKE 1 TABLET BY MOUTH EVERY DAY WITH BREAKFAST 90  tablet 1  . insulin regular human CONCENTRATED (HUMULIN R U-500 KWIKPEN) 500 UNIT/ML kwikpen USE AS DIRECTED 70 UNITS WITH BREAKFAST, 50 UNITS WITH LUNCH AND 70 UNITS AT SUPPER FOR PREMEAL BLOOD GLUCOSE READINGS >90 30 mL 2  . levothyroxine (SYNTHROID) 75 MCG tablet TAKE 1 TABLET (75 MCG TOTAL) BY MOUTH DAILY BEFORE BREAKFAST. 90 tablet 1  . metFORMIN (GLUCOPHAGE) 1000 MG tablet Take 500 mg by mouth daily with breakfast.  3  . nitroGLYCERIN (NITROSTAT) 0.4 MG SL tablet Place 1 tablet (0.4 mg total) under the tongue every 5 (five) minutes as needed. 25 tablet 3  . ONE TOUCH ULTRA TEST test strip USE TO TEST BLOOD SUGAR FOUR TIMES DAILY E11.65 150 each 5   No current facility-administered medications for this visit.   Allergies:  Patient has no known allergies.   Social History: The patient  reports that he quit smoking about 31 years ago. His smoking use included cigarettes. He started smoking about 64 years ago. He has a 11.50 pack-year smoking history. He has never used smokeless tobacco. He reports that he does not drink alcohol and does not use drugs.   Family History: The patient's family history includes Hypertension in an other family member.   ROS:  Please see the history of present illness. Otherwise, complete review of systems is positive for none.  All other systems are reviewed and negative.   Physical Exam: VS:  BP (!) 150/64   Pulse 84   Ht 6\' 3"  (1.905 m)   Wt 283 lb 12.8 oz (128.7 kg)   SpO2 98%   BMI 35.47 kg/m , BMI Body mass index is 35.47 kg/m.  Wt Readings from Last 3 Encounters:  09/27/19 283 lb 12.8 oz (128.7 kg)  09/06/19 279 lb (126.6 kg)  06/22/19 277 lb (125.6 kg)    General: Patient appears comfortable at rest. Neck: Supple, no elevated JVP or carotid bruits, no thyromegaly. Lungs: Clear to auscultation, nonlabored breathing at rest. Cardiac: Regular rate and rhythm, no S3 or significant systolic murmur, no pericardial rub. Extremities: No pitting edema,  distal pulses 2+. Skin: Warm and dry. Musculoskeletal: No kyphosis. Neuropsychiatric: Alert and oriented x3, affect grossly appropriate.  ECG:    Recent Labwork: 01/19/2019: BUN 12; Creatinine 1.0; TSH 1.86     Component Value Date/Time   CHOL 128 01/19/2019 0000   TRIG 98 01/19/2019 0000   HDL 36 01/19/2019 0000   CHOLHDL 4.5 08/01/2017 0243   VLDL 20 08/01/2017 0243   LDLCALC 73 01/19/2019 0000    Other Studies Reviewed Today:  NST 07/09/2019 Study Result  Narrative & Impression   There was no ST segment deviation noted during stress.  The left ventricular ejection fraction is normal (55-65%).  Findings consistent with prior inferior myocardial infarction with moderate peri-infarct ischemia. There is significant gut radiotracer uptake  adjacent to this segment in the stress images which may affect findings.  This is an intermediate risk study      Nuclear stress test 02/15/2018:   No diagnostic ST segment changes to indicate ischemia.  Small, mild intensity, apical to basal inferior/inferolateral defect that is partially reversible. This is consistent with a mild ischemic territory versus variable diaphragmatic attenuation.  This is an intermediate risk study.  Nuclear stress EF: 44%. Visually, LVEF appears preserved, suggest echocardiogram to confirm.  Echocardiogram 02/20/2018 (limited):  Left ventricle: The cavity size was normal. There was moderate concentric hypertrophy. Systolic function was normal. The estimated ejection fraction was in the range of 60% to 65%.LVEF 64.8% by GLS. Wall motion was normal; there were no regional wall motion abnormalities. Doppler parameters are consistent with abnormal left ventricular relaxation (grade 1 diastolic dysfunction). Doppler parameters are consistent with high ventricular filling pressure. - Aortic valve: Trileaflet; mildly thickened leaflets. - Mitral valve: Mild focal calcification of tip of  anterior leaflet. Mildly calcified annulus.     Cardiac catheterization 09/03/2011:  Hemodynamics:   RA:13/11/9 RV:34/7/9 PCWP:11/9/8 PA: 28/13/19  Cardiac Output  Thermodilution:6.0 with index of 2.5 Fick :5.6 with index of 2.3  Arterial Sat: 94 PA Sat:65.  LV pressure:158/19/17 Aortic pressure:153/90/115  Angiography   Left Main:The left main has minor luminal regularities but no significant stenosis.  Left anterior Descending:The LAD has diffuse mild right allergies. There is a stent in the mid LAD which is widely patent. The distal LAD has minor luminal irregularities. There is a 40-50% stenosis in the very distal aspect of the LAD. The first diagonal branch is a fairly large branch. There are minor little regular disease but no significant stenosis. The second diagonal artery is a moderate-sized vessel. There is a 50% focal stenosis.  Left Circumflex:The left circumflex artery is a moderate-sized vessel. There are minor luminal irregularities  Right Coronary Artery:The right coronary artery is large and dominant. There are diffuse irregularities throughout the RCA. There is a 40% stenosis in the distal RCA. This is followed by a focal 50% stenosis just prior to the bifurcation of 2 posterior lateral branches. This lesion does not appear to obstruct flow.  LV Gram:The left ventricular function is normal. Ejection fraction is roughly 65%.  Complications: No apparent complications Patient did tolerate procedure well.  Conclusions: 1. Mild to moderate coronary artery disease. The stent in the LAD is widely patent. There are no significant irregularities to explain his symptoms. 2. Normal left systolic function. Essentially normal right heart pressures. 3. Moderate to severe hypertension. I suspect that this is the cause of the shortness of breath. He will need better blood pressure control 4. Poorly controlled  diabetes. His glucose here in the lab was 329. He's on oral hypoglycemics. He will likely need to be started on insulin. We will defer to his medical Dr.   Judd Cortez and Plan:  1. DOE (dyspnea on exertion)   2. CAD in native artery   3. Essential hypertension, benign   4. NSVT (nonsustained ventricular tachycardia) ([place])   5. Bilateral lower extremity edema   6. History of CVA (cerebrovascular accident) without residual deficits    1. DOE (dyspnea on exertion) Continues with chronic exertional dyspnea.  States this has been going on for approximately 3 years.  Denies any worsening of the dyspnea.  Previous history of smoking.  Continue Lasix 20 mg p.o. twice daily.  2. CAD in native artery Denies any progressive anginal symptoms.  Continue Plavix 75  mg daily.  Imdur 30 mg daily.  Nitroglycerin sublingual as needed  3. Essential hypertension, benign Blood pressure elevated on arrival at 150/64.  Increase carvedilol to 12.5 mg p.o. twice daily.  Start measuring her blood pressure daily for the next 2 weeks and come back for nurse visit in 2 weeks for blood pressure check.  Continue amlodipine 10 mg daily, benazepril 40 mg daily, Lasix 20 mg p.o. twice daily.  4. NSVT (nonsustained ventricular tachycardia) ([facility]) Denies any episodes of palpitations or arrhythmias.  5. Bilateral lower extremity edema No evidence of lower extremity edema noted today on exam.  Continue Lasix 20 mg p.o. twice daily.  6. History of CVA (cerebrovascular accident) without residual deficits History of lacunar infarcts with no residual focal neurologic deficits.  Medication Adjustments/Labs and Tests Ordered: Current medicines are reviewed at length with the patient today.  Concerns regarding medicines are outlined above.   Disposition: Follow-up with Dr. Domenic Polite or APP 6 months.  Signed, Levell July, NP 09/27/2019 8:58 AM    [facility] [place], [place], [place]  [id number] Phone: [phone]; Fax: [phone]

## 2019-09-27 ENCOUNTER — Encounter: Payer: Self-pay | Admitting: Family Medicine

## 2019-09-27 ENCOUNTER — Ambulatory Visit (INDEPENDENT_AMBULATORY_CARE_PROVIDER_SITE_OTHER): Payer: Medicare Other | Admitting: Family Medicine

## 2019-09-27 VITALS — BP 150/64 | HR 84 | Ht 75.0 in | Wt 283.8 lb

## 2019-09-27 DIAGNOSIS — I472 Ventricular tachycardia: Secondary | ICD-10-CM | POA: Diagnosis not present

## 2019-09-27 DIAGNOSIS — Z8673 Personal history of transient ischemic attack (TIA), and cerebral infarction without residual deficits: Secondary | ICD-10-CM

## 2019-09-27 DIAGNOSIS — I10 Essential (primary) hypertension: Secondary | ICD-10-CM | POA: Diagnosis not present

## 2019-09-27 DIAGNOSIS — R0609 Other forms of dyspnea: Secondary | ICD-10-CM

## 2019-09-27 DIAGNOSIS — R600 Localized edema: Secondary | ICD-10-CM

## 2019-09-27 DIAGNOSIS — I2510 Atherosclerotic heart disease of native coronary artery without angina pectoris: Secondary | ICD-10-CM

## 2019-09-27 DIAGNOSIS — I4729 Other ventricular tachycardia: Secondary | ICD-10-CM

## 2019-09-27 DIAGNOSIS — R0600 Dyspnea, unspecified: Secondary | ICD-10-CM

## 2019-09-27 MED ORDER — CARVEDILOL 12.5 MG PO TABS: 12.5000 mg | ORAL_TABLET | Freq: Two times a day (BID) | ORAL | 3 refills | Status: DC

## 2019-09-27 NOTE — Patient Instructions (Addendum)
Medication Instructions:   Your physician has recommended you make the following change in your medication:   Increase carvedilol to 12.5 mg by mouth twice daily. You may take (2) of your 6.25 mg tablets twice daily until they are finished.   Continue other medications the same  Labwork:  NONE  Testing/Procedures:  NONE  Follow-Up:  Your physician recommends that you schedule a follow-up appointment in: 6 months (office). You will receive a reminder letter in the mail in about 4 months reminding you to call and schedule your appointment. If you don't receive this letter, please contact our office.  Your physician recommends that you schedule a follow-up appointment in: 2 weeks for a nurse visit to check your blood pressure. Please bring your home blood pressure readings to this visit.  Any Other Special Instructions Will Be Listed Below (If Applicable). Your physician has requested that you regularly monitor and record your blood pressure readings at home. Please use the same machine at the same time of day to check your readings and record them to bring to your follow-up visit.  If you need a refill on your cardiac medications before your next appointment, please call your pharmacy.

## 2019-10-11 ENCOUNTER — Ambulatory Visit: Payer: Medicare Other | Admitting: *Deleted

## 2019-10-11 VITALS — BP 120/68 | HR 69

## 2019-10-11 DIAGNOSIS — I10 Essential (primary) hypertension: Secondary | ICD-10-CM

## 2019-10-11 DIAGNOSIS — E1165 Type 2 diabetes mellitus with hyperglycemia: Secondary | ICD-10-CM | POA: Diagnosis not present

## 2019-10-11 DIAGNOSIS — I5032 Chronic diastolic (congestive) heart failure: Secondary | ICD-10-CM | POA: Diagnosis not present

## 2019-10-11 DIAGNOSIS — R5383 Other fatigue: Secondary | ICD-10-CM | POA: Diagnosis not present

## 2019-10-11 DIAGNOSIS — E782 Mixed hyperlipidemia: Secondary | ICD-10-CM | POA: Diagnosis not present

## 2019-10-11 NOTE — Progress Notes (Signed)
Blood pressure looks outstanding.  Continue current therapy.  Thank you

## 2019-10-11 NOTE — Progress Notes (Signed)
Left message to return call 

## 2019-10-11 NOTE — Progress Notes (Signed)
Patient in office for bp nurse visit for vitals recheck.    BP this morning was 120/6/  69  97%.    Did not bring list of readings with him, but states has been running similar today.  No complaints today.

## 2019-10-11 NOTE — Progress Notes (Signed)
Patient notified and verbalized understanding. 

## 2019-10-12 DIAGNOSIS — E11649 Type 2 diabetes mellitus with hypoglycemia without coma: Secondary | ICD-10-CM | POA: Diagnosis not present

## 2019-10-12 DIAGNOSIS — I110 Hypertensive heart disease with heart failure: Secondary | ICD-10-CM | POA: Diagnosis not present

## 2019-10-12 DIAGNOSIS — E7849 Other hyperlipidemia: Secondary | ICD-10-CM | POA: Diagnosis not present

## 2019-10-12 DIAGNOSIS — I5032 Chronic diastolic (congestive) heart failure: Secondary | ICD-10-CM | POA: Diagnosis not present

## 2019-10-18 DIAGNOSIS — I700 Atherosclerosis of aorta: Secondary | ICD-10-CM | POA: Diagnosis not present

## 2019-10-18 DIAGNOSIS — E1165 Type 2 diabetes mellitus with hyperglycemia: Secondary | ICD-10-CM | POA: Diagnosis not present

## 2019-10-18 DIAGNOSIS — K760 Fatty (change of) liver, not elsewhere classified: Secondary | ICD-10-CM | POA: Diagnosis not present

## 2019-10-18 DIAGNOSIS — I5032 Chronic diastolic (congestive) heart failure: Secondary | ICD-10-CM | POA: Diagnosis not present

## 2019-10-18 DIAGNOSIS — E782 Mixed hyperlipidemia: Secondary | ICD-10-CM | POA: Diagnosis not present

## 2019-10-18 DIAGNOSIS — Z6838 Body mass index (BMI) 38.0-38.9, adult: Secondary | ICD-10-CM | POA: Diagnosis not present

## 2019-10-18 DIAGNOSIS — I472 Ventricular tachycardia: Secondary | ICD-10-CM | POA: Diagnosis not present

## 2019-10-18 DIAGNOSIS — I10 Essential (primary) hypertension: Secondary | ICD-10-CM | POA: Diagnosis not present

## 2019-11-04 IMAGING — CT CT ANGIO NECK
1 of 8 series · 6 of 33 positions shown · IV contrast (OMNI 350)
Comparison: Head CT from earlier today. Brain MRI report from
04/20/2013

CLINICAL DATA: Right-sided weakness

EXAM:
CT ANGIOGRAPHY HEAD AND NECK
TECHNIQUE: Multidetector CT imaging of the head and neck was performed using
the standard protocol during bolus administration of intravenous
contrast. Multiplanar CT image reconstructions and MIPs were
obtained to evaluate the vascular anatomy. Carotid stenosis
measurements (when applicable) are obtained utilizing NASCET
criteria, using the distal internal carotid diameter as the
denominator.
CONTRAST:  50mL GPU14V-3OP IOPAMIDOL (GPU14V-3OP) INJECTION 76%

[Series 7: cta neck axial · axial · 0.39mm/px · z∈[-296,-19]mm · 6 of 389 slices shown]
[im 56/389  soft-tissue]
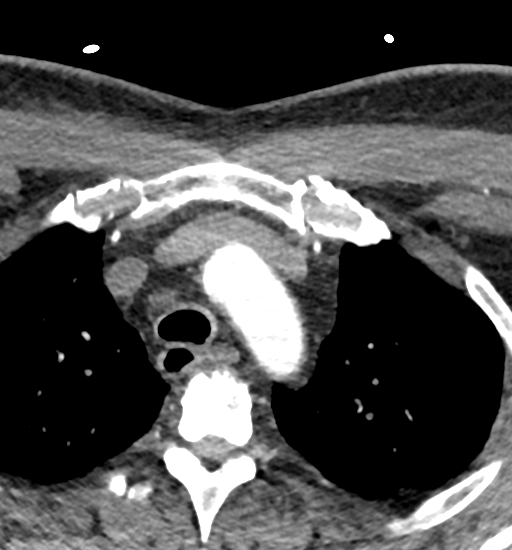
[im 111/389  bone]
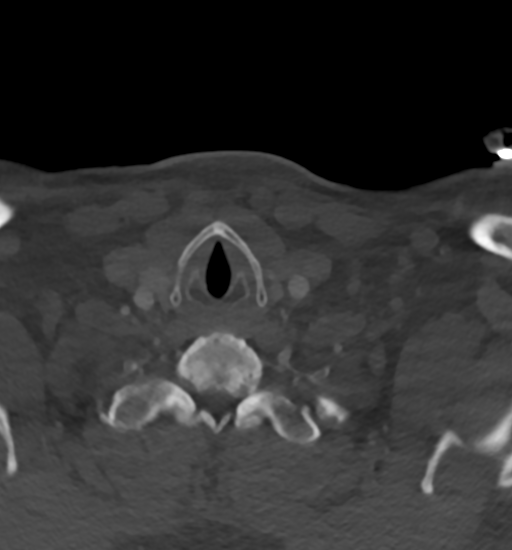
[im 167/389  soft-tissue]
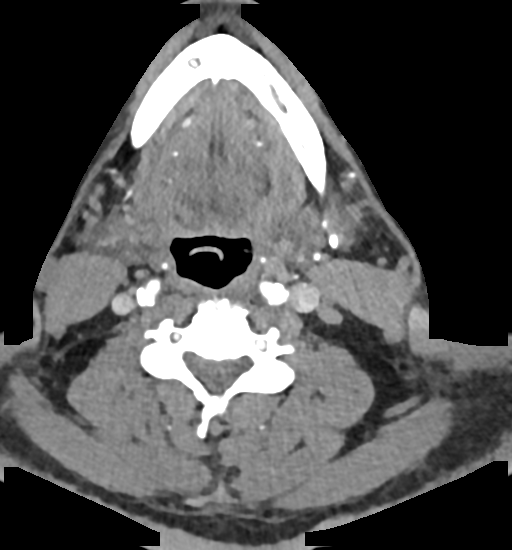
[im 222/389  bone]
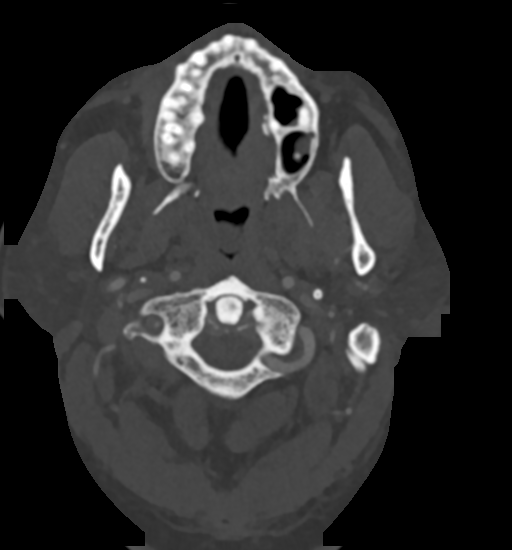
[im 278/389  soft-tissue]
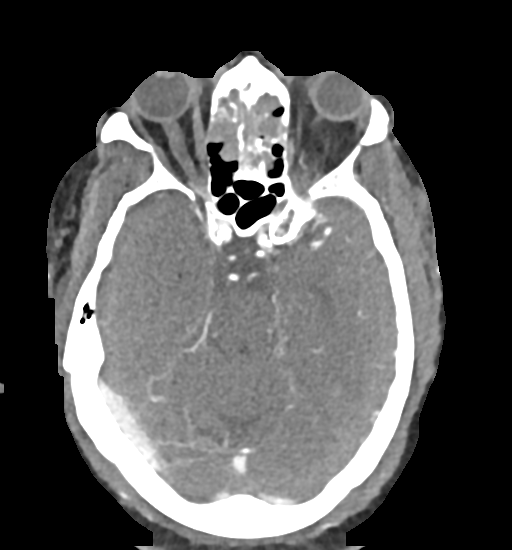
[im 333/389  bone]
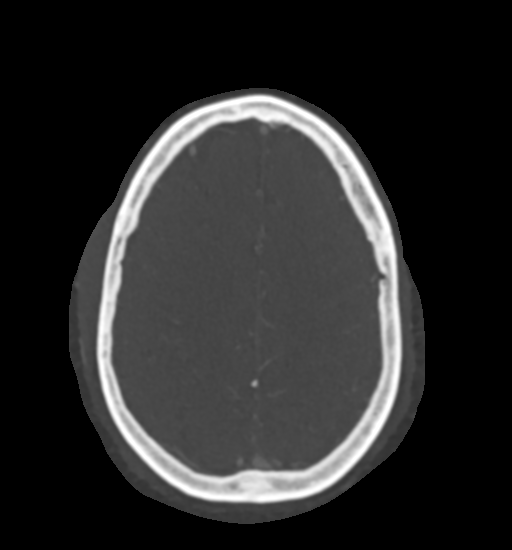

[6 of 33 positions shown; findings below may reference images not displayed]

FINDINGS: CTA NECK FINDINGS

Aortic arch: Mild atherosclerotic plaque.  Two vessel branching.

Right carotid system: Moderate mainly calcified plaque at the common
carotid bifurcation. No flow limiting stenosis or ulceration.

Left carotid system: Mild calcified plaque along the common carotid
and ICA bulb. No flow limiting stenosis or ulceration.

Vertebral arteries: No proximal subclavian stenosis. Left vertebral
artery dominance. Intermittent mild irregularity of the right
vertebral artery on reformats is is likely artifactual, accentuated
by small vessel size. There is a small calcified plaque at the right
vertebral origin without suspected flow limiting stenosis.

Skeleton: Diffuse degenerative disc narrowing and endplate ridging.

Other neck: Posterior left nasal cavity polyp extending into the
nasopharynx where it measures up to 2 cm on axial slices. Patchy
opacification of the paranasal sinuses.

Upper chest: Probable coronary atherosclerotic calcification on the
left.

Review of the MIP images confirms the above findings

CTA HEAD FINDINGS

Anterior circulation: Carotid siphon atherosclerotic plaque with
bilateral stenosis. On coronal reformats stenosis at the bilateral
mid cavernous segment measures 
 60%. No large vessel occlusion.
Best seen on coronal postcontrast imaging there is a focal narrowing
of the distal left M1 segment from superior wall eccentric defect.
This could be plaque or plaque with thrombus.

Posterior circulation: Left dominant. The right vertebral artery
ends in PICA. Mild-to-moderate narrowing of the left distal P2
segment. No branch occlusion or flow limiting stenosis.

Venous sinuses: Patent

Anatomic variants: None significant

Delayed phase: Enhancing extra-axial mass along the left frontal
operculum measuring up to 4.3 cm in diameter and 13 mm in thickness.
No underlying brain edema seen.

Review of the MIP images confirms the above findings
IMPRESSION: 1. Negative for emergent large vessel occlusion.
2. Moderate to advanced left M1 segment stenosis due to eccentric
filling defect, possibly symptomatic in this setting.
3. 
60% bilateral cavernous ICA narrowing due to calcified plaque.
4. No flow limiting stenosis in the neck.
5. Presumed meningioma along the left frontal convexity measuring up
to 4.3 x 1.3 cm.
6. Left nasal cavity polyp herniating into the nasopharynx. Patchy
bilateral sinusitis.

## 2019-11-04 IMAGING — CT CT HEAD W/O CM
4 series · 14 of 47 positions shown, 16 images · non-contrast
Comparison: None.

CLINICAL DATA: Acute onset of right arm numbness and right-sided
facial droop. Right-sided weakness.

EXAM:
CT HEAD WITHOUT CONTRAST
TECHNIQUE: Contiguous axial images were obtained from the base of the skull
through the vertex without intravenous contrast.

[Series 2: head wo · axial · 0.46mm/px · z∈[-161,-41]mm · 7 of 34 slices shown, 9 images]
[im 5/34  brain]
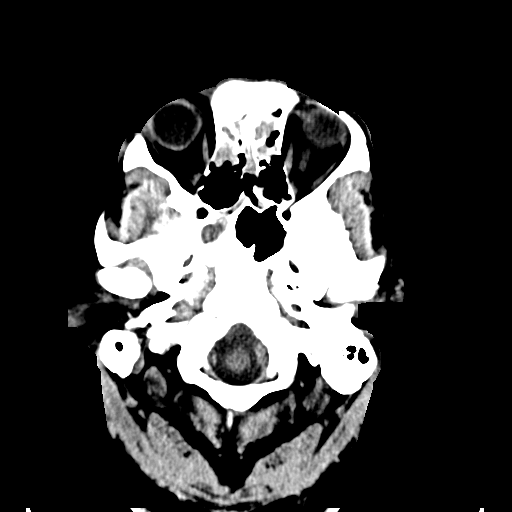
[im 5/34  bone]
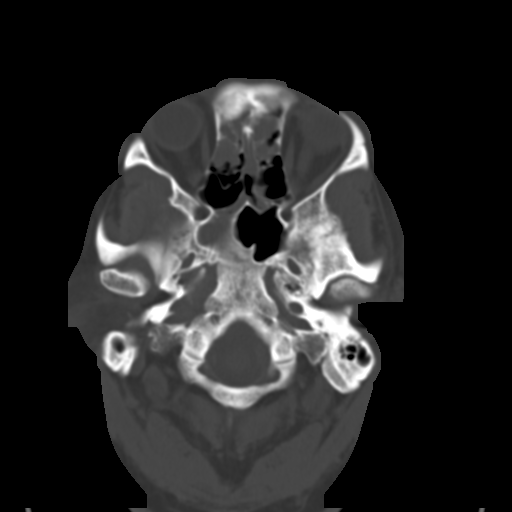
[im 9/34  brain]
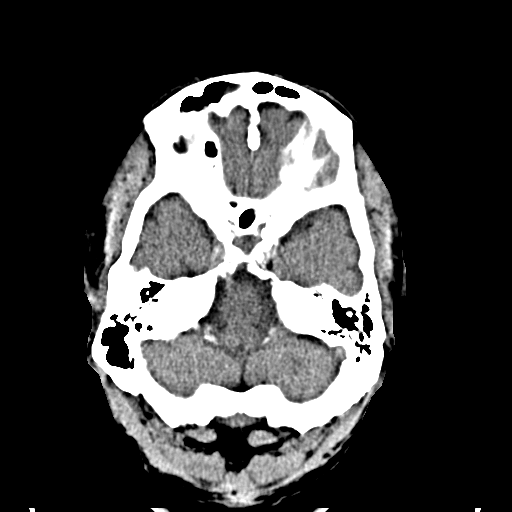
[im 13/34  brain]
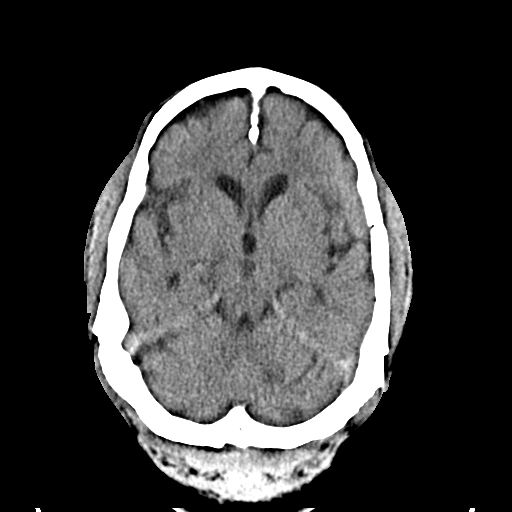
[im 17/34  brain]
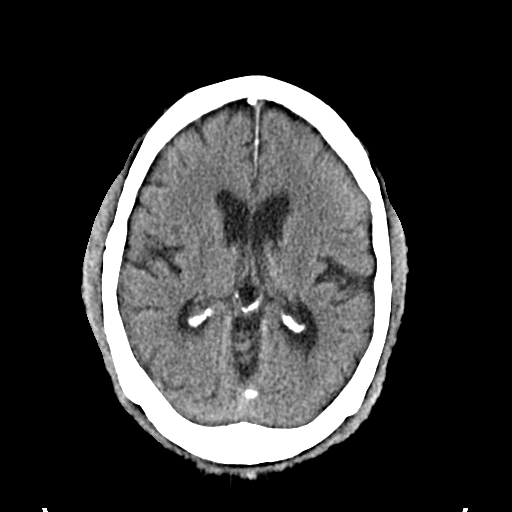
[im 21/34  brain]
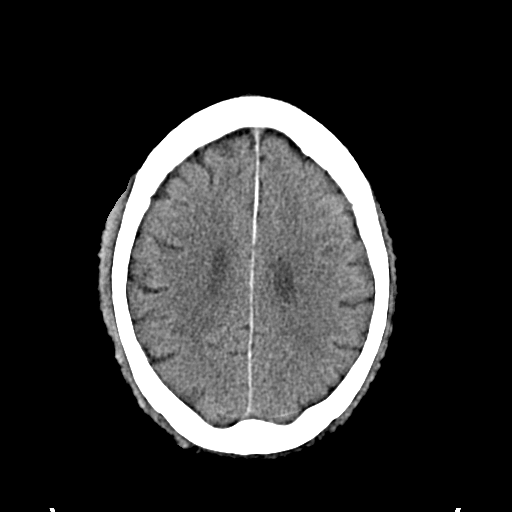
[im 21/34  bone]
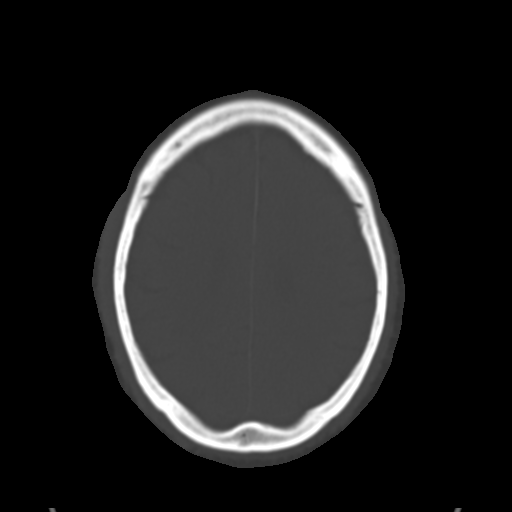
[im 25/34  brain]
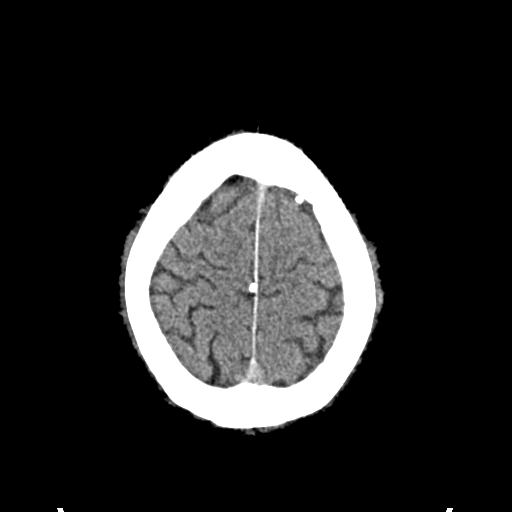
[im 29/34  brain]
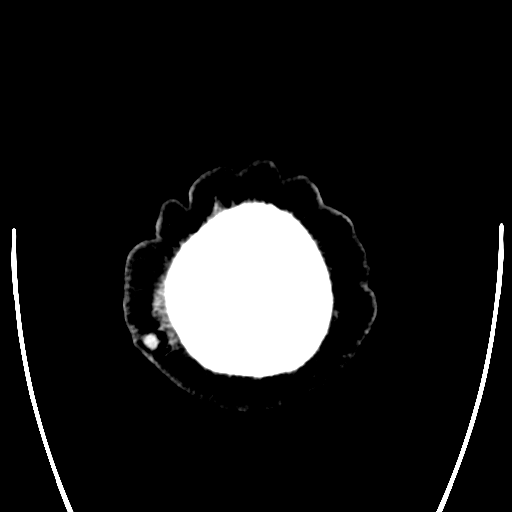

[Series 3: head bone · axial · 0.46mm/px · 1 of 84 slices shown]
[im 9/84  bone]
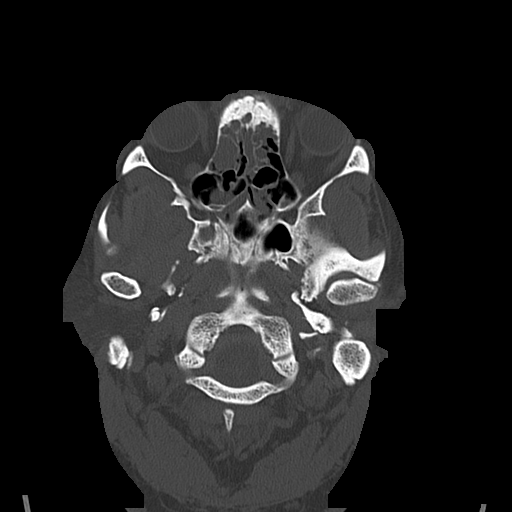

[Series 4: cor soft · coronal · 0.33mm/px · 3 of 72 slices shown]
[im 24/72  brain]
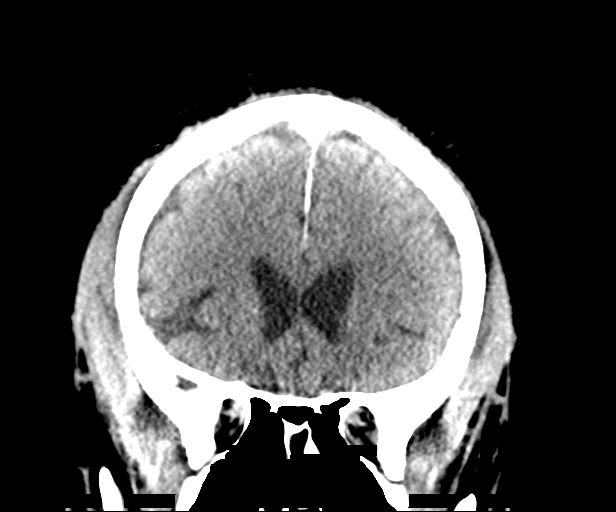
[im 32/72  brain]
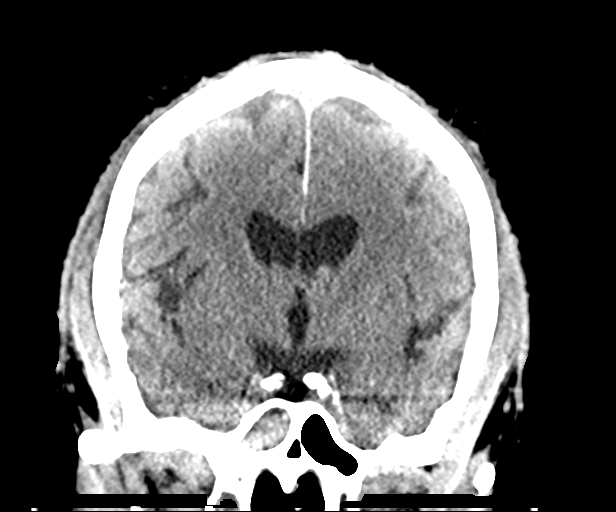
[im 40/72  brain]
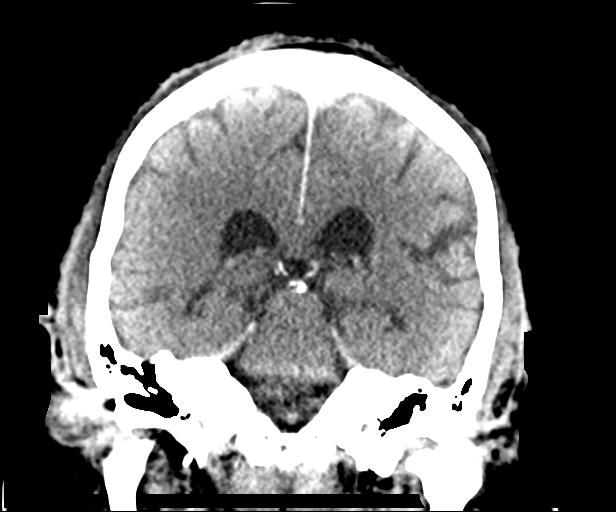

[Series 5: sag soft · sagittal · 0.33mm/px · 3 of 67 slices shown]
[im 23/67  brain]
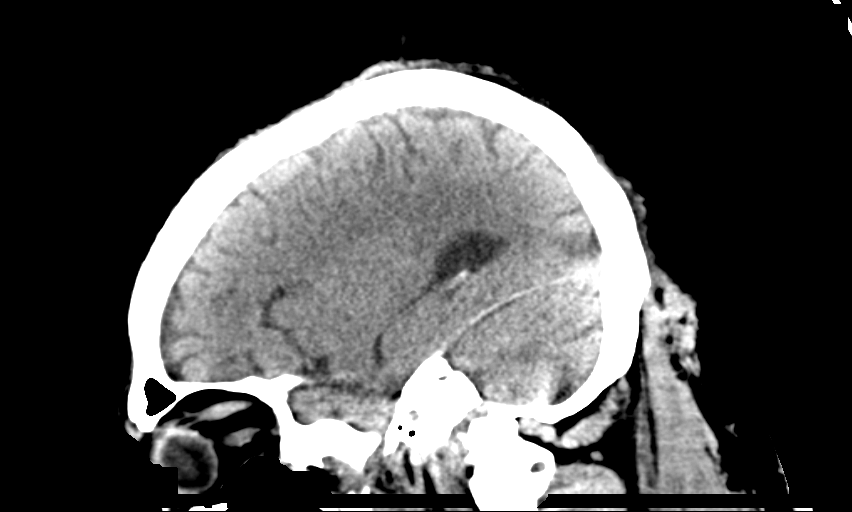
[im 34/67  brain]
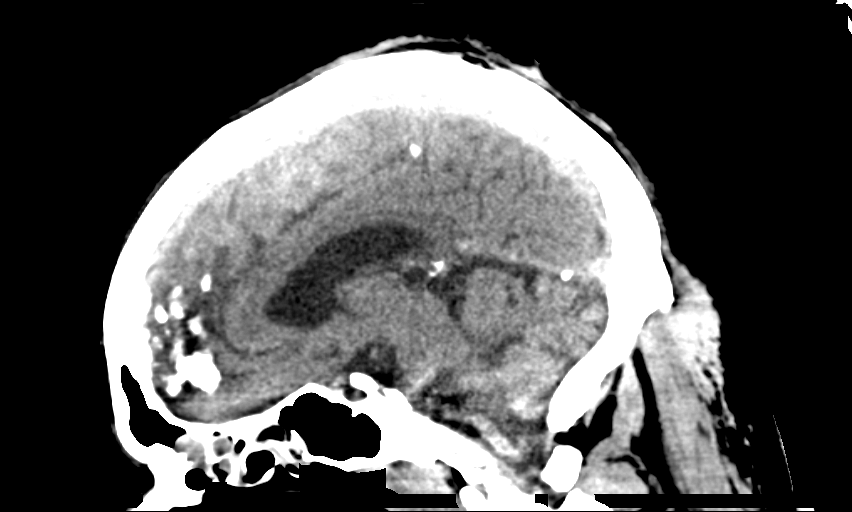
[im 45/67  brain]
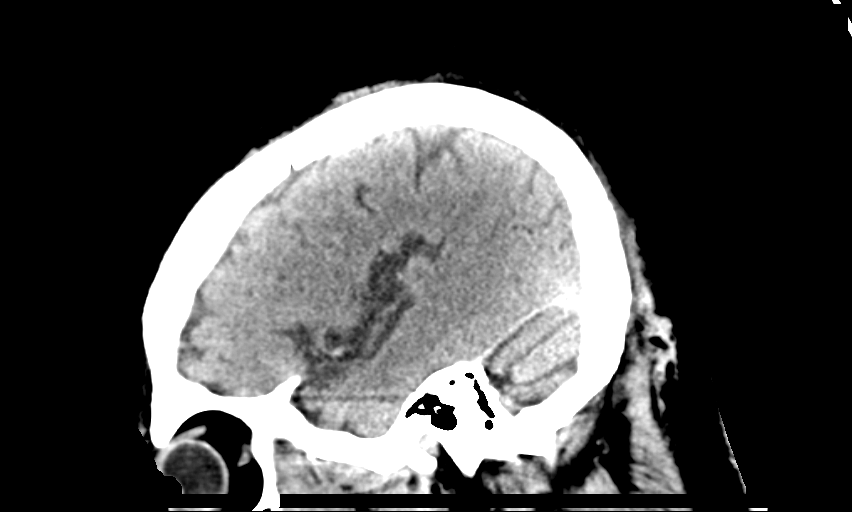

[14 of 47 positions shown; findings below may reference images not displayed]

FINDINGS: Brain: No evidence of acute infarction, hemorrhage, hydrocephalus,
extra-axial collection or mass lesion / mass effect.

Prominence of the ventricles and sulci reflects mild cortical volume
loss. Mild cerebellar atrophy is noted. Mild periventricular white
matter change likely reflects small vessel ischemic microangiopathy.

The brainstem and fourth ventricle are within normal limits. The
basal ganglia are unremarkable in appearance. The cerebral
hemispheres demonstrate grossly normal gray-white differentiation.
No mass effect or midline shift is seen.

Vascular: No hyperdense vessel or unexpected calcification.

Skull: There is no evidence of fracture; visualized osseous
structures are unremarkable in appearance.

Sinuses/Orbits: The visualized portions of the orbits are within
normal limits. There is opacification of the right side of the
sphenoid sinus and partial opacification of the right frontal sinus.
The remaining paranasal sinuses and mastoid air cells are
well-aerated.

Other: No significant soft tissue abnormalities are seen.
IMPRESSION: 1. No acute intracranial pathology seen on CT.
2. Mild cortical volume loss and scattered small vessel ischemic
microangiopathy.
3. Opacification of the right side of the sphenoid sinus and partial
opacification of the right frontal sinus.

## 2019-11-13 DIAGNOSIS — E7849 Other hyperlipidemia: Secondary | ICD-10-CM | POA: Diagnosis not present

## 2019-11-13 DIAGNOSIS — I110 Hypertensive heart disease with heart failure: Secondary | ICD-10-CM | POA: Diagnosis not present

## 2019-11-13 DIAGNOSIS — I5032 Chronic diastolic (congestive) heart failure: Secondary | ICD-10-CM | POA: Diagnosis not present

## 2019-11-13 DIAGNOSIS — E11649 Type 2 diabetes mellitus with hypoglycemia without coma: Secondary | ICD-10-CM | POA: Diagnosis not present

## 2019-11-20 ENCOUNTER — Other Ambulatory Visit: Payer: Self-pay | Admitting: *Deleted

## 2019-11-20 MED ORDER — CARVEDILOL 12.5 MG PO TABS: 12.5000 mg | ORAL_TABLET | Freq: Two times a day (BID) | ORAL | 3 refills | Status: DC

## 2019-12-13 DIAGNOSIS — E7849 Other hyperlipidemia: Secondary | ICD-10-CM | POA: Diagnosis not present

## 2019-12-13 DIAGNOSIS — I110 Hypertensive heart disease with heart failure: Secondary | ICD-10-CM | POA: Diagnosis not present

## 2019-12-13 DIAGNOSIS — E11649 Type 2 diabetes mellitus with hypoglycemia without coma: Secondary | ICD-10-CM | POA: Diagnosis not present

## 2019-12-13 DIAGNOSIS — I5032 Chronic diastolic (congestive) heart failure: Secondary | ICD-10-CM | POA: Diagnosis not present

## 2019-12-14 ENCOUNTER — Other Ambulatory Visit: Payer: Self-pay | Admitting: *Deleted

## 2019-12-14 MED ORDER — ISOSORBIDE MONONITRATE ER 30 MG PO TB24: 30.0000 mg | ORAL_TABLET | Freq: Every day | ORAL | 6 refills | Status: DC

## 2020-01-01 DIAGNOSIS — E1159 Type 2 diabetes mellitus with other circulatory complications: Secondary | ICD-10-CM | POA: Diagnosis not present

## 2020-01-01 DIAGNOSIS — E039 Hypothyroidism, unspecified: Secondary | ICD-10-CM | POA: Diagnosis not present

## 2020-01-02 LAB — COMPLETE METABOLIC PANEL WITH GFR
AG Ratio: 1.1 (calc) (ref 1.0–2.5)
ALT: 29 U/L (ref 9–46)
AST: 20 U/L (ref 10–35)
Albumin: 3.9 g/dL (ref 3.6–5.1)
Alkaline phosphatase (APISO): 71 U/L (ref 35–144)
BUN: 13 mg/dL (ref 7–25)
CO2: 28 mmol/L (ref 20–32)
Calcium: 8.9 mg/dL (ref 8.6–10.3)
Chloride: 103 mmol/L (ref 98–110)
Creat: 1.15 mg/dL (ref 0.70–1.18)
GFR, Est African American: 74 mL/min/{1.73_m2} (ref >=60)
GFR, Est Non African American: 64 mL/min/{1.73_m2} (ref >=60)
Globulin: 3.6 g/dL (calc) (ref 1.9–3.7)
Glucose, Bld: 253 mg/dL — ABNORMAL HIGH (ref 65–99)
Potassium: 4.3 mmol/L (ref 3.5–5.3)
Sodium: 138 mmol/L (ref 135–146)
Total Bilirubin: 0.7 mg/dL (ref 0.2–1.2)
Total Protein: 7.5 g/dL (ref 6.1–8.1)

## 2020-01-02 LAB — TSH: TSH: 1.38 mIU/L (ref 0.40–4.50)

## 2020-01-02 LAB — T4, FREE: Free T4: 1.1 ng/dL (ref 0.8–1.8)

## 2020-01-07 ENCOUNTER — Ambulatory Visit (INDEPENDENT_AMBULATORY_CARE_PROVIDER_SITE_OTHER): Payer: Medicare Other | Admitting: "Endocrinology

## 2020-01-07 ENCOUNTER — Other Ambulatory Visit: Payer: Self-pay

## 2020-01-07 ENCOUNTER — Encounter: Payer: Self-pay | Admitting: "Endocrinology

## 2020-01-07 VITALS — BP 158/82 | HR 80 | Ht 75.0 in | Wt 282.6 lb

## 2020-01-07 DIAGNOSIS — E1159 Type 2 diabetes mellitus with other circulatory complications: Secondary | ICD-10-CM | POA: Diagnosis not present

## 2020-01-07 DIAGNOSIS — I10 Essential (primary) hypertension: Secondary | ICD-10-CM | POA: Diagnosis not present

## 2020-01-07 DIAGNOSIS — I2510 Atherosclerotic heart disease of native coronary artery without angina pectoris: Secondary | ICD-10-CM | POA: Diagnosis not present

## 2020-01-07 DIAGNOSIS — E782 Mixed hyperlipidemia: Secondary | ICD-10-CM | POA: Diagnosis not present

## 2020-01-07 DIAGNOSIS — E039 Hypothyroidism, unspecified: Secondary | ICD-10-CM

## 2020-01-07 LAB — POCT GLYCOSYLATED HEMOGLOBIN (HGB A1C): Hemoglobin A1C: 9.3 % — AB (ref 4.0–5.6)

## 2020-01-07 MED ORDER — HUMULIN R U-500 KWIKPEN 500 UNIT/ML ~~LOC~~ SOPN
PEN_INJECTOR | SUBCUTANEOUS | 2 refills | Status: DC
Start: 2020-01-07 — End: 2020-02-20

## 2020-01-07 NOTE — Patient Instructions (Signed)

## 2020-01-07 NOTE — Progress Notes (Signed)
01/07/2020   Endocrinology follow-up note    Subjective:    Patient ID: Alex Cortez, male    DOB: Feb 24, 1951, PCP Burdine, Virgina Evener, MD   Past Medical History:  Diagnosis Date  . CAD (coronary artery disease)    nonobstructive CAD/patent LAD stent site; EF 65%, 08/2011; NSTEMI/DES LAD, 6/20 11  . Chest pain   . Diabetes mellitus    insulin-dependent  . Dyslipidemia   . HTN (hypertension)   . Post MI syndrome ([place]) 08-29-2009  . Stroke [facility]) 10/22/2012   Left inferior pons   Past Surgical History:  Procedure Laterality Date  . CORONARY ANGIOPLASTY WITH STENT PLACEMENT     Social History   Socioeconomic History  . Marital status: Married    Spouse name: Not on file  . Number of children: Not on file  . Years of education: Not on file  . Highest education level: Not on file  Occupational History  . Not on file  Tobacco Use  . Smoking status: Former Smoker    Packs/day: 0.50    Years: 23.00    Pack years: 11.50    Types: Cigarettes    Start date: 03/16/1955    Quit date: 03/15/1988    Years since quitting: 31.8  . Smokeless tobacco: Never Used  Vaping Use  . Vaping Use: Never used  Substance and Sexual Activity  . Alcohol use: No    Alcohol/week: 0.0 standard drinks  . Drug use: No  . Sexual activity: Not on file  Other Topics Concern  . Not on file  Social History Narrative   Works at [employer]. Married.    Social Determinants of Health   Financial Resource Strain:   . Difficulty of Paying Living Expenses: Not on file  Food Insecurity:   . Worried About Charity fundraiser in the Last Year: Not on file  . Ran Out of Food in the Last Year: Not on file  Transportation Needs:   . Lack of Transportation (Medical): Not on file  . Lack of Transportation (Non-Medical): Not on file  Physical Activity:   . Days of Exercise per Week: Not on file  . Minutes of Exercise per Session: Not on file  Stress:   . Feeling of Stress : Not on file  Social  Connections:   . Frequency of Communication with Friends and Family: Not on file  . Frequency of Social Gatherings with Friends and Family: Not on file  . Attends Religious Services: Not on file  . Active Member of Clubs or Organizations: Not on file  . Attends Archivist Meetings: Not on file  . Marital Status: Not on file   Outpatient Encounter Medications as of 01/07/2020  Medication Sig  . amLODipine (NORVASC) 10 MG tablet Take 10 mg by mouth daily. for high blood pressure  . atorvastatin (LIPITOR) 80 MG tablet Take 1 tablet (80 mg total) by mouth every evening.  . benazepril (LOTENSIN) 40 MG tablet Take 1 tablet (40 mg total) by mouth daily.  . carvedilol (COREG) 12.5 MG tablet Take 1 tablet (12.5 mg total) by mouth 2 (two) times daily.  . clopidogrel (PLAVIX) 75 MG tablet Take 1 tablet (75 mg total) by mouth daily.  . Continuous Blood Gluc Sensor (FREESTYLE LIBRE 14 DAY SENSOR) MISC USE AND DISCARD 1 SENSOR   EVERY 14 DAYS  . fluticasone (FLONASE) 50 MCG/ACT nasal spray Place 2 sprays into both nostrils daily.  . furosemide (LASIX) 20 MG tablet  Take 20 mg by mouth 2 (two) times daily.  Marland Kitchen glipiZIDE (GLUCOTROL XL) 5 MG 24 hr tablet TAKE 1 TABLET BY MOUTH EVERY DAY WITH BREAKFAST  . insulin regular human CONCENTRATED (HUMULIN R U-500 KWIKPEN) 500 UNIT/ML kwikpen USE AS DIRECTED 80 UNITS WITH BREAKFAST, 60 UNITS WITH LUNCH AND 80 UNITS AT SUPPER FOR PREMEAL BLOOD GLUCOSE READINGS >90  . isosorbide mononitrate (IMDUR) 30 MG 24 hr tablet Take 1 tablet (30 mg total) by mouth daily.  Marland Kitchen levothyroxine (SYNTHROID) 75 MCG tablet TAKE 1 TABLET (75 MCG TOTAL) BY MOUTH DAILY BEFORE BREAKFAST.  . metFORMIN (GLUCOPHAGE) 1000 MG tablet Take 500 mg by mouth daily with breakfast.  . nitroGLYCERIN (NITROSTAT) 0.4 MG SL tablet Place 1 tablet (0.4 mg total) under the tongue every 5 (five) minutes as needed.  . ONE TOUCH ULTRA TEST test strip USE TO TEST BLOOD SUGAR FOUR TIMES DAILY E11.65  .  [DISCONTINUED] insulin regular human CONCENTRATED (HUMULIN R U-500 KWIKPEN) 500 UNIT/ML kwikpen USE AS DIRECTED 70 UNITS WITH BREAKFAST, 50 UNITS WITH LUNCH AND 70 UNITS AT SUPPER FOR PREMEAL BLOOD GLUCOSE READINGS >90   No facility-administered encounter medications on file as of 01/07/2020.   ALLERGIES: No Known Allergies VACCINATION STATUS:  There is no immunization history on file for this patient.  Diabetes He presents for his follow-up diabetic visit. He has type 2 diabetes mellitus. Onset time: He was diagnosed at approximate age of 64 years. His disease course has been worsening. There are no hypoglycemic associated symptoms. Pertinent negatives for hypoglycemia include no confusion, headaches, pallor or seizures. Associated symptoms include polydipsia and polyuria. Pertinent negatives for diabetes include no chest pain, no fatigue, no polyphagia and no weakness. There are no hypoglycemic complications. Symptoms are worsening. Diabetic complications include a CVA, heart disease and nephropathy. Risk factors for coronary artery disease include diabetes mellitus, dyslipidemia, hypertension, male sex, obesity, sedentary lifestyle and tobacco exposure. Current diabetic treatment includes intensive insulin program. He is compliant with treatment most of the time. His weight is increasing steadily. He is following a generally unhealthy (He consumes a large quantity of soda.) diet. When asked about meal planning, he reported none. He has had a previous visit with a dietitian. He never participates in exercise. His home blood glucose trend is increasing steadily. His breakfast blood glucose range is generally 180-200 mg/dl. His lunch blood glucose range is generally 180-200 mg/dl. His dinner blood glucose range is generally 180-200 mg/dl. His bedtime blood glucose range is generally 180-200 mg/dl. His overall blood glucose range is 180-200 mg/dl. (He presents with his CGM device.  Printouts and analysis  he is 33% time range, 64% above range.  He did not have major hypoglycemia.  His point-of-care A1c is increasing to 9.3% from 8.7%. He is known to have discrepancy between A1c and EAG. ) An ACE inhibitor/angiotensin II receptor blocker is being taken. Eye exam is current.  Hyperlipidemia This is a chronic problem. The current episode started more than 1 year ago. The problem is controlled. Exacerbating diseases include chronic renal disease, diabetes and obesity. Pertinent negatives include no chest pain, leg pain, myalgias or shortness of breath. Current antihyperlipidemic treatment includes statins. Risk factors for coronary artery disease include diabetes mellitus, dyslipidemia, hypertension, male sex, obesity and a sedentary lifestyle.  Hypertension This is a chronic problem. The current episode started more than 1 year ago. The problem is controlled. Pertinent negatives include no chest pain, headaches, neck pain, palpitations or shortness of breath. Risk factors for coronary  artery disease include obesity, male gender, dyslipidemia, diabetes mellitus, sedentary lifestyle and family history. Past treatments include ACE inhibitors. Hypertensive end-organ damage includes kidney disease and CVA. Identifiable causes of hypertension include chronic renal disease.     Review of systems  Constitutional: + Gaining weight,  current  Body mass index is 35.32 kg/m. , no fatigue, no subjective hyperthermia, no subjective hypothermia Eyes: no blurry vision, no xerophthalmia ENT: no sore throat, no nodules palpated in throat, no dysphagia/odynophagia, no hoarseness Cardiovascular: no Chest Pain, no Shortness of Breath, no palpitations, no leg swelling Respiratory: no cough, no shortness of breath Gastrointestinal: no Nausea/Vomiting/Diarhhea Musculoskeletal: no muscle/joint aches Skin: no rashes, no hyperemia Neurological: no tremors, no numbness, no tingling, no dizziness Psychiatric: no depression, no  anxiety   Objective:    BP (!) 158/82   Pulse 80   Ht 6\' 3"  (1.905 m)   Wt 282 lb 9.6 oz (128.2 kg)   BMI 35.32 kg/m   Wt Readings from Last 3 Encounters:  01/07/20 282 lb 9.6 oz (128.2 kg)  09/27/19 283 lb 12.8 oz (128.7 kg)  09/06/19 279 lb (126.6 kg)      Physical Exam- Limited  Constitutional:  Body mass index is 35.32 kg/m. , not in acute distress, normal state of mind Eyes:  EOMI, no exophthalmos Neck: Supple  Respiratory: Adequate breathing efforts Musculoskeletal: no gross deformities, strength intact in all four extremities, no gross restriction of joint movements Skin:  no rashes, no hyperemia, + lipodystrophy on abdominal skin Neurological: no tremor with outstretched hands    CMP Latest Ref Rng & Units 01/01/2020 01/19/2019 09/25/2018  Glucose 65 - 99 mg/dL 253(H) - 227(H)  BUN 7 - 25 mg/dL 13 12 16   Creatinine 0.70 - 1.18 mg/dL 1.15 1.0 1.05  Sodium 135 - 146 mmol/L 138 - 139  Potassium 3.5 - 5.3 mmol/L 4.3 - 4.6  Chloride 98 - 110 mmol/L 103 - 104  CO2 20 - 32 mmol/L 28 - 29  Calcium 8.6 - 10.3 mg/dL 8.9 - 9.1  Total Protein 6.1 - 8.1 g/dL 7.5 - 7.5  Total Bilirubin 0.2 - 1.2 mg/dL 0.7 - 0.7  Alkaline Phos 38 - 126 U/L - - -  AST 10 - 35 U/L 20 - 17  ALT 9 - 46 U/L 29 - 30    Recent Results (from the past 2160 hour(s))  COMPLETE METABOLIC PANEL WITH GFR     Status: Abnormal   Collection Time: 01/01/20  7:59 AM  Result Value Ref Range   Glucose, Bld 253 (H) 65 - 99 mg/dL    Comment: .            Fasting reference interval . For someone without known diabetes, a glucose value >125 mg/dL indicates that they may have diabetes and this should be confirmed with a follow-up test. .    BUN 13 7 - 25 mg/dL   Creat 1.15 0.70 - 1.18 mg/dL    Comment: For patients >14 years of age, the reference limit for Creatinine is approximately 13% higher for people identified as African-American. .    GFR, Est Non African American 64 > OR = 60 mL/min/1.1m2    GFR, Est African American 74 > OR = 60 mL/min/1.29m2   BUN/Creatinine Ratio NOT APPLICABLE 6 - 22 (calc)   Sodium 138 135 - 146 mmol/L   Potassium 4.3 3.5 - 5.3 mmol/L   Chloride 103 98 - 110 mmol/L   CO2 28 20 - 32 mmol/L  Calcium 8.9 8.6 - 10.3 mg/dL   Total Protein 7.5 6.1 - 8.1 g/dL   Albumin 3.9 3.6 - 5.1 g/dL   Globulin 3.6 1.9 - 3.7 g/dL (calc)   AG Ratio 1.1 1.0 - 2.5 (calc)   Total Bilirubin 0.7 0.2 - 1.2 mg/dL   Alkaline phosphatase (APISO) 71 35 - 144 U/L   AST 20 10 - 35 U/L   ALT 29 9 - 46 U/L  TSH     Status: None   Collection Time: 01/01/20  7:59 AM  Result Value Ref Range   TSH 1.38 0.40 - 4.50 mIU/L  T4, free     Status: None   Collection Time: 01/01/20  7:59 AM  Result Value Ref Range   Free T4 1.1 0.8 - 1.8 ng/dL  HgB A1c     Status: Abnormal   Collection Time: 01/07/20  8:45 AM  Result Value Ref Range   Hemoglobin A1C 9.3 (A) 4.0 - 5.6 %   HbA1c POC (<> result, manual entry)     HbA1c, POC (prediabetic range)     HbA1c, POC (controlled diabetic range)     Lipid Panel     Component Value Date/Time   CHOL 128 01/19/2019 0000   TRIG 98 01/19/2019 0000   HDL 36 01/19/2019 0000   CHOLHDL 4.5 08/01/2017 0243   VLDL 20 08/01/2017 0243   LDLCALC 73 01/19/2019 0000     Assessment & Plan:   1. Type 2 diabetes mellitus with vascular disease (HCC)  -His  diabetes is complicated by recurrent CVA , coronary artery disease, obesity/sedentary life and patient remains at extremely  high risk for more acute and chronic complications of diabetes which include CAD, CVA, CKD, retinopathy, and neuropathy. These are all discussed in detail with the patient.  He presents with his CGM device.  Printouts and analysis he is 33% time range, 64% above range.  He did not have major hypoglycemia.  His point-of-care A1c is increasing to 9.3% from 8.7%. He is known to have discrepancy between A1c and EAG.  - Glucose logs and insulin administration records pertaining to this  visit,  to be scanned into patient's records.  Recent labs reviewed.  - I have re-counseled the patient on diet management and weight loss  by adopting a carbohydrate restricted / protein rich  Diet.  - he  admits there is a room for improvement in his diet and drink choices. -  Suggestion is made for him to avoid simple carbohydrates  from his diet including Cakes, Sweet Desserts / Pastries, Ice Cream, Soda (diet and regular), Sweet Tea, Candies, Chips, Cookies, Sweet Pastries,  Store Bought Juices, Alcohol in Excess of  1-2 drinks a day, Artificial Sweeteners, Coffee Creamer, and "Sugar-free" Products. This will help patient to have stable blood glucose profile and potentially avoid unintended weight gain.   - Patient is advised to stick to a routine mealtimes to eat 3 meals  a day and avoid unnecessary snacks ( to snack only to correct hypoglycemia).  - I have approached patient with the following individualized plan to manage diabetes and patient agrees.  -U500 is still better option for him.   -He is advised to increase his Humulin U50 0 to 80 units with breakfast, 60 units with lunch, and 80 units with supper  for pre-meal blood glucose readings of 90 mg/dL or above.   -I have advised him on how he can rotate insulin injection sites on his abdominal skin. -He is  warned not not to take insulin without proper monitoring of blood glucose.  He is advised to  call clinic if he registers blood glucose less than 70 mg/dL or greater than 200 mg/dL. -He is benefiting and will continue on low-dose Metformin 500 mg p.o. daily at breakfast, as well as low-dose glipizide 5 mg XL p.o. daily at breakfast.    -This patient will benefit from bariatric surgery.  I discussed and gave him the information brochure and contact numbers again.  He previously did not qualify because his BMI was 33, now his BMI of 35.3.  And he has several comorbidities which increases his chance of getting coverage .  - Patient  specific target  for A1c; LDL, HDL, Triglycerides, and  Waist Circumference were discussed in detail.  2) BP/HTN: His blood pressure is not controlled to target.   He is advised to increase his benazepril to 40 mg p.o. daily.    3) Lipids/HPL: His lipid panel shows controlled LDL 73.    He is advised to continue atorvastatin 80 mg p.o. nightly.    Side effects and precautions discussed with him.   4)  Weight/Diet: His BMI is 26.9-SWNIOEV complicating his diabetes care.  Marland Kitchen  Has no success for weight control mainly due to his dietary indiscretion.  He has consulted with CDE.  He is advised on weight loss surgery.  See above.  5) hypothyroidism- His previsit thyroid function tests are consistent with appropriate replacement.    He is advised to continue levothyroxine to 75 mcg p.o. every morning   - We discussed about the correct intake of his thyroid hormone, on empty stomach at fasting, with water, separated by at least 30 minutes from breakfast and other medications,  and separated by more than 4 hours from calcium, iron, multivitamins, acid reflux medications (PPIs). -Patient is made aware of the fact that thyroid hormone replacement is needed for life, dose to be adjusted by periodic monitoring of thyroid function tests.  6) Chronic Care/Health Maintenance:  -Patient  is  on ACEI/ARB and Statin medications and encouraged to continue to follow up with Ophthalmology, urologist given his recent recurrent CVA, podiatrist at least yearly or according to recommendations, and advised to  stay away from smoking. I have recommended yearly flu vaccine and pneumonia vaccination at least every 5 years; moderate intensity exercise for up to 150 minutes weekly; and  sleep for at least 7 hours a day.  - I advised patient to maintain close follow up with Burdine, Virgina Evener, MD for primary care needs.  - Time spent on this patient care encounter:  40 min, of which > 50% was spent in  counseling and the rest  reviewing his blood glucose logs , discussing his hypoglycemia and hyperglycemia episodes, reviewing his current and  previous labs / studies  ( including abstraction from other facilities) and medications  doses and developing a  long term treatment plan and documenting his care.   Please refer to Patient Instructions for Blood Glucose Monitoring and Insulin/Medications Dosing Guide"  in media tab for additional information. Please  also refer to " Patient Self Inventory" in the Media  tab for reviewed elements of pertinent patient history.  Marvene Staff Hibberd participated in the discussions, expressed understanding, and voiced agreement with the above plans.  All questions were answered to his satisfaction. he is encouraged to contact clinic should he have any questions or concerns prior to his return visit.   Follow up plan: -Return in  about 3 months (around 04/08/2020) for Bring Meter and Logs- A1c in Office, ABI in Office [place].  Glade Lloyd, MD Phone: [phone]  Fax: [phone]  -  This note was partially dictated with voice recognition software. Similar sounding words can be transcribed inadequately or may not  be corrected upon review.  01/07/2020, 10:47 AM

## 2020-01-15 ENCOUNTER — Other Ambulatory Visit: Payer: Self-pay | Admitting: "Endocrinology

## 2020-02-12 DIAGNOSIS — I5032 Chronic diastolic (congestive) heart failure: Secondary | ICD-10-CM | POA: Diagnosis not present

## 2020-02-12 DIAGNOSIS — E7849 Other hyperlipidemia: Secondary | ICD-10-CM | POA: Diagnosis not present

## 2020-02-12 DIAGNOSIS — E11649 Type 2 diabetes mellitus with hypoglycemia without coma: Secondary | ICD-10-CM | POA: Diagnosis not present

## 2020-02-12 DIAGNOSIS — I110 Hypertensive heart disease with heart failure: Secondary | ICD-10-CM | POA: Diagnosis not present

## 2020-02-17 ENCOUNTER — Other Ambulatory Visit: Payer: Self-pay | Admitting: "Endocrinology

## 2020-02-18 ENCOUNTER — Other Ambulatory Visit: Payer: Self-pay | Admitting: "Endocrinology

## 2020-02-19 ENCOUNTER — Other Ambulatory Visit: Payer: Self-pay | Admitting: *Deleted

## 2020-02-19 MED ORDER — CARVEDILOL 12.5 MG PO TABS: 12.5000 mg | ORAL_TABLET | Freq: Two times a day (BID) | ORAL | 0 refills | Status: DC

## 2020-02-20 ENCOUNTER — Other Ambulatory Visit: Payer: Self-pay | Admitting: "Endocrinology

## 2020-03-14 DIAGNOSIS — E11649 Type 2 diabetes mellitus with hypoglycemia without coma: Secondary | ICD-10-CM | POA: Diagnosis not present

## 2020-03-14 DIAGNOSIS — E7849 Other hyperlipidemia: Secondary | ICD-10-CM | POA: Diagnosis not present

## 2020-03-14 DIAGNOSIS — I110 Hypertensive heart disease with heart failure: Secondary | ICD-10-CM | POA: Diagnosis not present

## 2020-03-14 DIAGNOSIS — I5032 Chronic diastolic (congestive) heart failure: Secondary | ICD-10-CM | POA: Diagnosis not present

## 2020-03-28 DIAGNOSIS — I5032 Chronic diastolic (congestive) heart failure: Secondary | ICD-10-CM | POA: Diagnosis not present

## 2020-03-28 DIAGNOSIS — E11649 Type 2 diabetes mellitus with hypoglycemia without coma: Secondary | ICD-10-CM | POA: Diagnosis not present

## 2020-03-28 DIAGNOSIS — E782 Mixed hyperlipidemia: Secondary | ICD-10-CM | POA: Diagnosis not present

## 2020-03-28 DIAGNOSIS — R5383 Other fatigue: Secondary | ICD-10-CM | POA: Diagnosis not present

## 2020-03-28 DIAGNOSIS — I10 Essential (primary) hypertension: Secondary | ICD-10-CM | POA: Diagnosis not present

## 2020-03-28 DIAGNOSIS — E1165 Type 2 diabetes mellitus with hyperglycemia: Secondary | ICD-10-CM | POA: Diagnosis not present

## 2020-03-29 ENCOUNTER — Other Ambulatory Visit: Payer: Self-pay | Admitting: "Endocrinology

## 2020-04-02 DIAGNOSIS — K760 Fatty (change of) liver, not elsewhere classified: Secondary | ICD-10-CM | POA: Diagnosis not present

## 2020-04-02 DIAGNOSIS — I252 Old myocardial infarction: Secondary | ICD-10-CM | POA: Diagnosis not present

## 2020-04-02 DIAGNOSIS — I10 Essential (primary) hypertension: Secondary | ICD-10-CM | POA: Diagnosis not present

## 2020-04-02 DIAGNOSIS — I5032 Chronic diastolic (congestive) heart failure: Secondary | ICD-10-CM | POA: Diagnosis not present

## 2020-04-02 DIAGNOSIS — I472 Ventricular tachycardia: Secondary | ICD-10-CM | POA: Diagnosis not present

## 2020-04-02 DIAGNOSIS — E7849 Other hyperlipidemia: Secondary | ICD-10-CM | POA: Diagnosis not present

## 2020-04-02 DIAGNOSIS — Z955 Presence of coronary angioplasty implant and graft: Secondary | ICD-10-CM | POA: Diagnosis not present

## 2020-04-02 DIAGNOSIS — E1165 Type 2 diabetes mellitus with hyperglycemia: Secondary | ICD-10-CM | POA: Diagnosis not present

## 2020-04-08 ENCOUNTER — Ambulatory Visit: Payer: Medicare Other | Admitting: "Endocrinology

## 2020-04-09 ENCOUNTER — Other Ambulatory Visit: Payer: Self-pay

## 2020-04-09 ENCOUNTER — Ambulatory Visit (INDEPENDENT_AMBULATORY_CARE_PROVIDER_SITE_OTHER): Payer: Medicare Other | Admitting: "Endocrinology

## 2020-04-09 ENCOUNTER — Encounter: Payer: Self-pay | Admitting: "Endocrinology

## 2020-04-09 VITALS — BP 139/80 | HR 89 | Ht 75.0 in | Wt 285.6 lb

## 2020-04-09 DIAGNOSIS — E1159 Type 2 diabetes mellitus with other circulatory complications: Secondary | ICD-10-CM

## 2020-04-09 DIAGNOSIS — E782 Mixed hyperlipidemia: Secondary | ICD-10-CM | POA: Diagnosis not present

## 2020-04-09 DIAGNOSIS — E039 Hypothyroidism, unspecified: Secondary | ICD-10-CM

## 2020-04-09 DIAGNOSIS — I10 Essential (primary) hypertension: Secondary | ICD-10-CM

## 2020-04-09 LAB — HEMOGLOBIN A1C: Hemoglobin A1C: 8.9

## 2020-04-09 NOTE — Patient Instructions (Signed)
                                     Advice for Weight Management  -For most of us the best way to lose weight is by diet management. Generally speaking, diet management means consuming less calories intentionally which over time brings about progressive weight loss.  This can be achieved more effectively by restricting carbohydrate consumption to the minimum possible.  So, it is critically important to know your numbers: how much calorie you are consuming and how much calorie you need. More importantly, our carbohydrates sources should be unprocessed or minimally processed complex starch food items.   Sometimes, it is important to balance nutrition by increasing protein intake (animal or plant source), fruits, and vegetables.  -Sticking to a routine mealtime to eat 3 meals a day and avoiding unnecessary snacks is shown to have a big role in weight control. Under normal circumstances, the only time we lose real weight is when we are hungry, so allow hunger to take place- hunger means no food between meal times, only water.  It is not advisable to starve.   -It is better to avoid simple carbohydrates including: Cakes, Sweet Desserts, Ice Cream, Soda (diet and regular), Sweet Tea, Candies, Chips, Cookies, Store Bought Juices, Alcohol in Excess of  1-2 drinks a day, Lemonade,  Artificial Sweeteners, Doughnuts, Coffee Creamers, "Sugar-free" Products, etc, etc.  This is not a complete list.....    -Consulting with certified diabetes educators is proven to provide you with the most accurate and current information on diet.  Also, you may be  interested in discussing diet options/exchanges , we can schedule a visit with Alex Cortez, RDN, CDE for individualized nutrition education.  -Exercise: If you are able: 30 -60 minutes a day ,4 days a week, or 150 minutes a week.  The longer the better.  Combine stretch, strength, and aerobic activities.  If you were told in the  past that you have high risk for cardiovascular diseases, you may seek evaluation by your heart doctor prior to initiating moderate to intense exercise programs.                                  Additional Care Considerations for Diabetes   -Diabetes  is a chronic disease.  The most important care consideration is regular follow-up with your diabetes care provider with the goal being avoiding or delaying its complications and to take advantage of advances in medications and technology.    -Type 2 diabetes is known to coexist with other important comorbidities such as high blood pressure and high cholesterol.  It is critical to control not only the diabetes but also the high blood pressure and high cholesterol to minimize and delay the risk of complications including coronary artery disease, stroke, amputations, blindness, etc.    - Studies showed that people with diabetes will benefit from a class of medications known as ACE inhibitors and statins.  Unless there are specific reasons not to be on these medications, the standard of care is to consider getting one from these groups of medications at an optimal doses.  These medications are generally considered safe and proven to help protect the heart and the kidneys.    - People with diabetes are encouraged to initiate and maintain regular follow-up with eye doctors, foot   doctors, dentists , and if necessary heart and kidney doctors.     - It is highly recommended that people with diabetes quit smoking or stay away from smoking, and get yearly  flu vaccine and pneumonia vaccine at least every 5 years.  One other important lifestyle recommendation is to ensure adequate sleep - at least 6-7 hours of uninterrupted sleep at night.  -Exercise: If you are able: 30 -60 minutes a day, 4 days a week, or 150 minutes a week.  The longer the better.  Combine stretch, strength, and aerobic activities.  If you were told in the past that you have high risk for  cardiovascular diseases, you may seek evaluation by your heart doctor prior to initiating moderate to intense exercise programs.          

## 2020-04-09 NOTE — Progress Notes (Signed)
04/09/2020   Endocrinology follow-up note    Subjective:    Patient ID: Alex Cortez, male    DOB: [birth year], PCP Burdine, Virgina Evener, MD   Past Medical History:  Diagnosis Date  . CAD (coronary artery disease)    nonobstructive CAD/patent LAD stent site; EF 65%, 08/2011; NSTEMI/DES LAD, 6/20 11  . Chest pain   . Diabetes mellitus    insulin-dependent  . Dyslipidemia   . HTN (hypertension)   . Post MI syndrome ([place]) 08-29-2009  . Stroke [facility]) 10/22/2012   Left inferior pons   Past Surgical History:  Procedure Laterality Date  . CORONARY ANGIOPLASTY WITH STENT PLACEMENT     Social History   Socioeconomic History  . Marital status: Married    Spouse name: Not on file  . Number of children: Not on file  . Years of education: Not on file  . Highest education level: Not on file  Occupational History  . Not on file  Tobacco Use  . Smoking status: Former Smoker    Packs/day: 0.50    Years: 23.00    Pack years: 11.50    Types: Cigarettes    Start date: 03/16/1955    Quit date: 03/15/1988    Years since quitting: 32.0  . Smokeless tobacco: Never Used  Vaping Use  . Vaping Use: Never used  Substance and Sexual Activity  . Alcohol use: No    Alcohol/week: 0.0 standard drinks  . Drug use: No  . Sexual activity: Not on file  Other Topics Concern  . Not on file  Social History Narrative   Works at [employer]. Married.    Social Determinants of Health   Financial Resource Strain: Not on file  Food Insecurity: Not on file  Transportation Needs: Not on file  Physical Activity: Not on file  Stress: Not on file  Social Connections: Not on file   Outpatient Encounter Medications as of 04/09/2020  Medication Sig  . amLODipine (NORVASC) 10 MG tablet Take 10 mg by mouth daily. for high blood pressure  . atorvastatin (LIPITOR) 80 MG tablet Take 1 tablet (80 mg total) by mouth every evening.  . benazepril (LOTENSIN) 40 MG tablet Take 1 tablet (40 mg total) by mouth daily.   . carvedilol (COREG) 12.5 MG tablet Take 1 tablet (12.5 mg total) by mouth 2 (two) times daily.  . clopidogrel (PLAVIX) 75 MG tablet Take 1 tablet (75 mg total) by mouth daily.  . Continuous Blood Gluc Sensor (FREESTYLE LIBRE 14 DAY SENSOR) MISC USE AND DISCARD 1 SENSOR   EVERY 14 DAYS  . fluticasone (FLONASE) 50 MCG/ACT nasal spray Place 2 sprays into both nostrils daily.  . furosemide (LASIX) 20 MG tablet Take 20 mg by mouth 2 (two) times daily.  Marland Kitchen glipiZIDE (GLUCOTROL XL) 5 MG 24 hr tablet TAKE 1 TABLET BY MOUTH EVERY DAY WITH BREAKFAST  . HUMULIN R U-500 KWIKPEN 500 UNIT/ML kwikpen INJECT AS DIRECTED 80 UNITS WITH BREAKFAST, 60 UNITS WITH LUNCH, AND 80 UNITS AT SUPPER FOR PRE-MEAL BLOOD GLUCOSE READINGS GREATER THAN 90  . isosorbide mononitrate (IMDUR) 30 MG 24 hr tablet Take 1 tablet (30 mg total) by mouth daily.  Marland Kitchen levothyroxine (SYNTHROID) 75 MCG tablet TAKE 1 TABLET BY MOUTH EVERY DAY BEFORE BREAKFAST  . metFORMIN (GLUCOPHAGE) 1000 MG tablet Take 500 mg by mouth daily with breakfast.  . nitroGLYCERIN (NITROSTAT) 0.4 MG SL tablet Place 1 tablet (0.4 mg total) under the tongue every 5 (five) minutes as needed.  Marland Kitchen  ONE TOUCH ULTRA TEST test strip USE TO TEST BLOOD SUGAR FOUR TIMES DAILY E11.65   No facility-administered encounter medications on file as of 04/09/2020.   ALLERGIES: No Known Allergies VACCINATION STATUS:  There is no immunization history on file for this patient.  Diabetes He presents for his follow-up diabetic visit. He has type 2 diabetes mellitus. Onset time: He was diagnosed at approximate age of 79 years. His disease course has been improving. There are no hypoglycemic associated symptoms. Pertinent negatives for hypoglycemia include no confusion, headaches, pallor or seizures. Pertinent negatives for diabetes include no chest pain, no fatigue, no polydipsia, no polyphagia, no polyuria and no weakness. There are no hypoglycemic complications. Symptoms are improving.  Diabetic complications include a CVA, heart disease and nephropathy. Risk factors for coronary artery disease include diabetes mellitus, dyslipidemia, hypertension, male sex, obesity, sedentary lifestyle and tobacco exposure. Current diabetic treatment includes intensive insulin program. He is compliant with treatment most of the time. His weight is fluctuating minimally. He is following a generally unhealthy (He consumes a large quantity of soda.) diet. When asked about meal planning, he reported none. He has had a previous visit with a dietitian. He never participates in exercise. His home blood glucose trend is decreasing steadily. His breakfast blood glucose range is generally 140-180 mg/dl. His lunch blood glucose range is generally 140-180 mg/dl. His dinner blood glucose range is generally 140-180 mg/dl. His bedtime blood glucose range is generally 140-180 mg/dl. His overall blood glucose range is 140-180 mg/dl. (He presents with his CGM device.  Printouts and analysis shows 62% time range, 29% slightly above range.  He has 2% hypoglycemia.  His point-of-care A1c is 8.9%.  He is known to have significant discrepancy between his A1c and EAG.) An ACE inhibitor/angiotensin II receptor blocker is being taken. Eye exam is current.  Hyperlipidemia This is a chronic problem. The current episode started more than 1 year ago. The problem is controlled. Exacerbating diseases include chronic renal disease, diabetes and obesity. Pertinent negatives include no chest pain, leg pain, myalgias or shortness of breath. Current antihyperlipidemic treatment includes statins. Risk factors for coronary artery disease include diabetes mellitus, dyslipidemia, hypertension, male sex, obesity and a sedentary lifestyle.  Hypertension This is a chronic problem. The current episode started more than 1 year ago. The problem is controlled. Pertinent negatives include no chest pain, headaches, neck pain, palpitations or shortness of  breath. Risk factors for coronary artery disease include obesity, male gender, dyslipidemia, diabetes mellitus, sedentary lifestyle and family history. Past treatments include ACE inhibitors. Hypertensive end-organ damage includes kidney disease and CVA. Identifiable causes of hypertension include chronic renal disease.     Review of systems  Constitutional: + Gaining weight,  current  Body mass index is 35.7 kg/m. , no fatigue, no subjective hyperthermia, no subjective hypothermia    Objective:    BP 139/80   Pulse 89   Ht 6\' 3"  (1.905 m)   Wt 285 lb 9.6 oz (129.5 kg)   BMI 35.70 kg/m   Wt Readings from Last 3 Encounters:  04/09/20 285 lb 9.6 oz (129.5 kg)  01/07/20 282 lb 9.6 oz (128.2 kg)  09/27/19 283 lb 12.8 oz (128.7 kg)      Physical Exam- Limited  Constitutional:  Body mass index is 35.7 kg/m. , not in acute distress, normal state of mind Eyes:  EOMI, no exophthalmos Neck: Supple  Respiratory: Adequate breathing efforts Musculoskeletal: no gross deformities, strength intact in all four extremities, no gross restriction  of joint movements Skin:  no rashes, no hyperemia, + lipodystrophy on abdominal skin Neurological: no tremor with outstretched hands    CMP Latest Ref Rng & Units 01/01/2020 01/19/2019 09/25/2018  Glucose 65 - 99 mg/dL 253(H) - 227(H)  BUN 7 - 25 mg/dL 13 12 16   Creatinine 0.70 - 1.18 mg/dL 1.15 1.0 1.05  Sodium 135 - 146 mmol/L 138 - 139  Potassium 3.5 - 5.3 mmol/L 4.3 - 4.6  Chloride 98 - 110 mmol/L 103 - 104  CO2 20 - 32 mmol/L 28 - 29  Calcium 8.6 - 10.3 mg/dL 8.9 - 9.1  Total Protein 6.1 - 8.1 g/dL 7.5 - 7.5  Total Bilirubin 0.2 - 1.2 mg/dL 0.7 - 0.7  Alkaline Phos 38 - 126 U/L - - -  AST 10 - 35 U/L 20 - 17  ALT 9 - 46 U/L 29 - 30    No results found for this or any previous visit (from the past 2160 hour(s)). Lipid Panel     Component Value Date/Time   CHOL 128 01/19/2019 0000   TRIG 98 01/19/2019 0000   HDL 36 01/19/2019 0000    CHOLHDL 4.5 08/01/2017 0243   VLDL 20 08/01/2017 0243   LDLCALC 73 01/19/2019 0000     Assessment & Plan:   1. Type 2 diabetes mellitus with vascular disease (HCC)  -His  diabetes is complicated by recurrent CVA , coronary artery disease, obesity/sedentary life and patient remains at extremely  high risk for more acute and chronic complications of diabetes which include CAD, CVA, CKD, retinopathy, and neuropathy. These are all discussed in detail with the patient.  He presents with his CGM device.  Printouts and analysis shows 62% time range, 29% slightly above range.  He has 2% hypoglycemia.  His point-of-care A1c is 8.9%.  He is known to have significant discrepancy between his A1c and EAG.  - Glucose logs and insulin administration records pertaining to this visit,  to be scanned into patient's records.  Recent labs reviewed.  - I have re-counseled the patient on diet management and weight loss  by adopting a carbohydrate restricted / protein rich  Diet.  - he acknowledges that there is a room for improvement in his food and drink choices. - Suggestion is made for him to avoid simple carbohydrates  from his diet including Cakes, Sweet Desserts, Ice Cream, Soda (diet and regular), Sweet Tea, Candies, Chips, Cookies, Store Bought Juices, Alcohol in Excess of  1-2 drinks a day, Artificial Sweeteners,  Coffee Creamer, and "Sugar-free" Products, Lemonade. This will help patient to have more stable blood glucose profile and potentially avoid unintended weight gain.   - Patient is advised to stick to a routine mealtimes to eat 3 meals  a day and avoid unnecessary snacks ( to snack only to correct hypoglycemia).  - I have approached patient with the following individualized plan to manage diabetes and patient agrees.  -He continues to respond to U500 with better average blood glucose results.    -He is advised to continue Humulin U500 80 units with breakfast, 60 units with lunch, and 80  units with supper  for pre-meal blood glucose readings of 90 mg/dL or above.   -I have advised him on how he can rotate insulin injection sites on his abdominal skin. -He is warned not not to take insulin without proper monitoring of blood glucose.  He is advised to  call clinic if he registers blood glucose less than 70 mg/dL or  greater than 200 mg/dL. -He is benefiting and will continue on low-dose Metformin-500 mg p.o. daily at breakfast, as well as glipizide 5 mg XL p.o. daily at breakfast.   -This patient will benefit from bariatric surgery.  I discussed and gave him the information brochure and contact numbers again.  He previously did not qualify because his BMI was 33, now his BMI of 35 +.  And he has several comorbidities which increases his chance of getting coverage .  - Patient specific target  for A1c; LDL, HDL, Triglycerides, and  Waist Circumference were discussed in detail.  2) BP/HTN: His blood pressure is controlled to target.   He is advised to increase his benazepril to 40 mg p.o. daily.    3) Lipids/HPL: His lipid panel shows controlled LDL 73.    He is advised to continue atorvastatin 80 mg p.o. nightly.   Side effects and precautions discussed with him.   4)  Weight/Diet: His BMI is [id number] complicating his diabetes care.  Marland Kitchen  Has no success for weight control mainly due to his dietary indiscretion.  He has consulted with CDE.  He is advised on weight loss surgery.  See above.  5) hypothyroidism- His previsit thyroid function tests are consistent with appropriate replacement.    He is advised to continue levothyroxine to 75 mcg p.o. every morning   - We discussed about the correct intake of his thyroid hormone, on empty stomach at fasting, with water, separated by at least 30 minutes from breakfast and other medications,  and separated by more than 4 hours from calcium, iron, multivitamins, acid reflux medications (PPIs). -Patient is made aware of the fact that thyroid  hormone replacement is needed for life, dose to be adjusted by periodic monitoring of thyroid function tests.   6) Chronic Care/Health Maintenance:  -Patient  is  on ACEI/ARB and Statin medications and encouraged to continue to follow up with Ophthalmology, urologist given his recent recurrent CVA, podiatrist at least yearly or according to recommendations, and advised to  stay away from smoking. I have recommended yearly flu vaccine and pneumonia vaccination at least every 5 years; moderate intensity exercise for up to 150 minutes weekly; and  sleep for at least 7 hours a day.  POCT ABI Results 04/09/20  He is ABIs normal today April 09, 2020. Right ABI: 1.12      left ABI: 1.06  Right leg systolic / diastolic: [id number] mmHg Left leg systolic / diastolic: [id number] mmHg  Arm systolic / diastolic: [id number] mmHG  This study will be repeated in January 2027, or sooner if needed. - I advised patient to maintain close follow up with Burdine, Virgina Evener, MD for primary care needs.  - Time spent on this patient care encounter:  45 min, of which > 50% was spent in  counseling and the rest reviewing his blood glucose logs , discussing his hypoglycemia and hyperglycemia episodes, reviewing his current and  previous labs / studies  ( including abstraction from other facilities) and medications  doses and developing a  long term treatment plan and documenting his care.   Please refer to Patient Instructions for Blood Glucose Monitoring and Insulin/Medications Dosing Guide"  in media tab for additional information. Please  also refer to " Patient Self Inventory" in the Media  tab for reviewed elements of pertinent patient history.  [facility] Staff Ozimek participated in the discussions, expressed understanding, and voiced agreement with the above plans.  All questions were answered to his satisfaction.  he is encouraged to contact clinic should he have any questions or concerns prior to his return visit.    Follow  up plan: -Return in about 3 months (around 07/08/2020) for Bring Meter and Logs- A1c in Office.  Glade Lloyd, MD Phone: [phone]  Fax: [phone]  -  This note was partially dictated with voice recognition software. Similar sounding words can be transcribed inadequately or may not  be corrected upon review.  04/09/2020, 10:09 AM

## 2020-04-12 DIAGNOSIS — I5032 Chronic diastolic (congestive) heart failure: Secondary | ICD-10-CM | POA: Diagnosis not present

## 2020-04-12 DIAGNOSIS — E7849 Other hyperlipidemia: Secondary | ICD-10-CM | POA: Diagnosis not present

## 2020-04-12 DIAGNOSIS — E11649 Type 2 diabetes mellitus with hypoglycemia without coma: Secondary | ICD-10-CM | POA: Diagnosis not present

## 2020-04-12 DIAGNOSIS — I110 Hypertensive heart disease with heart failure: Secondary | ICD-10-CM | POA: Diagnosis not present

## 2020-05-08 ENCOUNTER — Encounter: Payer: Self-pay | Admitting: *Deleted

## 2020-05-08 ENCOUNTER — Encounter: Payer: Self-pay | Admitting: Cardiology

## 2020-05-08 ENCOUNTER — Ambulatory Visit (INDEPENDENT_AMBULATORY_CARE_PROVIDER_SITE_OTHER): Payer: Medicare Other | Admitting: Cardiology

## 2020-05-08 VITALS — BP 148/70 | HR 86 | Ht 75.0 in | Wt 287.0 lb

## 2020-05-08 DIAGNOSIS — I10 Essential (primary) hypertension: Secondary | ICD-10-CM | POA: Diagnosis not present

## 2020-05-08 DIAGNOSIS — I2510 Atherosclerotic heart disease of native coronary artery without angina pectoris: Secondary | ICD-10-CM

## 2020-05-08 DIAGNOSIS — Z136 Encounter for screening for cardiovascular disorders: Secondary | ICD-10-CM | POA: Diagnosis not present

## 2020-05-08 DIAGNOSIS — R0602 Shortness of breath: Secondary | ICD-10-CM

## 2020-05-08 MED ORDER — CARVEDILOL 25 MG PO TABS: 25.0000 mg | ORAL_TABLET | Freq: Two times a day (BID) | ORAL | 1 refills | Status: DC

## 2020-05-08 NOTE — Progress Notes (Signed)
Clinical Summary Mr. Degraaf is a 70 y.o.male former patient of Dr Bronson Ing, this is our first visit together.   1. CAD - prior stent to LAD in June 2011 - cath 2013 with patent stent and vessels 02/2018 echo: LVEF 60-65%, grade I dd - 06/2019 nuclear stress: inferior scar with mod peri-infarct ischemia vs artifact   - no recent chest pain. - chronic SOB with some improvement.    2. HTN - compliant with meds - checks bp at home,    3. History of CVA - has been on plavix  4. DM2  5. NSVT - no recent palpitations  6. Tobacco history - no cough, no wheezing - wants to think about breathing tests - 2017 255 lbs - wants to hold off on PFTs  AAA screen: over 65, smoking history. Has not had [place]  Has had covid vaccine x 3  Past Medical History:  Diagnosis Date  . CAD (coronary artery disease)    nonobstructive CAD/patent LAD stent site; EF 65%, 08/2011; NSTEMI/DES LAD, 6/20 11  . Chest pain   . Diabetes mellitus    insulin-dependent  . Dyslipidemia   . HTN (hypertension)   . Post MI syndrome ([place]) 08-29-2009  . Stroke ([facility]) 10/22/2012   Left inferior pons     No Known Allergies   Current Outpatient Medications  Medication Sig Dispense Refill  . amLODipine (NORVASC) 10 MG tablet Take 10 mg by mouth daily. for high blood pressure    . atorvastatin (LIPITOR) 80 MG tablet Take 1 tablet (80 mg total) by mouth every evening. 90 tablet 3  . benazepril (LOTENSIN) 40 MG tablet Take 1 tablet (40 mg total) by mouth daily. 90 tablet 1  . carvedilol (COREG) 12.5 MG tablet Take 1 tablet (12.5 mg total) by mouth 2 (two) times daily. 180 tablet 0  . clopidogrel (PLAVIX) 75 MG tablet Take 1 tablet (75 mg total) by mouth daily. 30 tablet 5  . Continuous Blood Gluc Sensor (FREESTYLE LIBRE 14 DAY SENSOR) MISC USE AND DISCARD 1 SENSOR   EVERY 14 DAYS 6 each 2  . fluticasone (FLONASE) 50 MCG/ACT nasal spray Place 2 sprays into both nostrils daily.    . furosemide (LASIX) 20 MG  tablet Take 20 mg by mouth 2 (two) times daily.    Marland Kitchen glipiZIDE (GLUCOTROL XL) 5 MG 24 hr tablet TAKE 1 TABLET BY MOUTH EVERY DAY WITH BREAKFAST 90 tablet 1  . HUMULIN R U-500 KWIKPEN 500 UNIT/ML kwikpen INJECT AS DIRECTED 80 UNITS WITH BREAKFAST, 60 UNITS WITH LUNCH, AND 80 UNITS AT SUPPER FOR PRE-MEAL BLOOD GLUCOSE READINGS GREATER THAN 90 30 mL 0  . isosorbide mononitrate (IMDUR) 30 MG 24 hr tablet Take 1 tablet (30 mg total) by mouth daily. 30 tablet 6  . levothyroxine (SYNTHROID) 75 MCG tablet TAKE 1 TABLET BY MOUTH EVERY DAY BEFORE BREAKFAST 90 tablet 0  . metFORMIN (GLUCOPHAGE) 1000 MG tablet Take 500 mg by mouth daily with breakfast.  3  . nitroGLYCERIN (NITROSTAT) 0.4 MG SL tablet Place 1 tablet (0.4 mg total) under the tongue every 5 (five) minutes as needed. 25 tablet 3  . ONE TOUCH ULTRA TEST test strip USE TO TEST BLOOD SUGAR FOUR TIMES DAILY E11.65 150 each 5   No current facility-administered medications for this visit.     Past Surgical History:  Procedure Laterality Date  . CORONARY ANGIOPLASTY WITH STENT PLACEMENT       No Known Allergies  Family History  Problem Relation Age of Onset  . Hypertension Other        in all family members     Social History Mr. Lill reports that he quit smoking about 32 years ago. His smoking use included cigarettes. He started smoking about 65 years ago. He has a 11.50 pack-year smoking history. He has never used smokeless tobacco. Mr. Chalk reports no history of alcohol use.   Review of Systems CONSTITUTIONAL: No weight loss, fever, chills, weakness or fatigue.  HEENT: Eyes: No visual loss, blurred vision, double vision or yellow sclerae.No hearing loss, sneezing, congestion, runny nose or sore throat.  SKIN: No rash or itching.  CARDIOVASCULAR: per hpi RESPIRATORY: per hpi GASTROINTESTINAL: No anorexia, nausea, vomiting or diarrhea. No abdominal pain or blood.  GENITOURINARY: No burning on urination, no  polyuria NEUROLOGICAL: No headache, dizziness, syncope, paralysis, ataxia, numbness or tingling in the extremities. No change in bowel or bladder control.  MUSCULOSKELETAL: No muscle, back pain, joint pain or stiffness.  LYMPHATICS: No enlarged nodes. No history of splenectomy.  PSYCHIATRIC: No history of depression or anxiety.  ENDOCRINOLOGIC: No reports of sweating, cold or heat intolerance. No polyuria or polydipsia.  Marland Kitchen   Physical Examination Today's Vitals   05/08/20 1019  BP: (!) 148/70  Pulse: 86  SpO2: 98%  Weight: 287 lb (130.2 kg)  Height: 6\' 3"  (1.905 m)   Body mass index is 35.87 kg/m.  Gen: resting comfortably, no acute distress HEENT: no scleral icterus, pupils equal round and reactive, no palptable cervical adenopathy,  CV: RRR, no m/r/g, no jvd Resp: Clear to auscultation bilaterally GI: abdomen is soft, non-tender, non-distended, normal bowel sounds, no hepatosplenomegaly MSK: extremities are warm, no edema.  Skin: warm, no rash Neuro:  no focal deficits Psych: appropriate affect   Diagnostic Studies  Nuclear stress test 02/15/2018:   No diagnostic ST segment changes to indicate ischemia.  Small, mild intensity, apical to basal inferior/inferolateral defect that is partially reversible. This is consistent with a mild ischemic territory versus variable diaphragmatic attenuation.  This is an intermediate risk study.  Nuclear stress EF: 44%. Visually, LVEF appears preserved, suggest echocardiogram to confirm.  Echocardiogram 02/20/2018 (limited):  Left ventricle: The cavity size was normal. There was moderate concentric hypertrophy. Systolic function was normal. The estimated ejection fraction was in the range of 60% to 65%.LVEF 64.8% by GLS. Wall motion was normal; there were no regional wall motion abnormalities. Doppler parameters are consistent with abnormal left ventricular relaxation (grade 1 diastolic dysfunction). Doppler  parameters are consistent with high ventricular filling pressure. - Aortic valve: Trileaflet; mildly thickened leaflets. - Mitral valve: Mild focal calcification of tip of anterior leaflet. Mildly calcified annulus.     Cardiac catheterization 09/03/2011:  Hemodynamics:   RA:13/11/9 RV:34/7/9 PCWP:11/9/8 PA: 28/13/19  Cardiac Output  Thermodilution:6.0 with index of 2.5 Fick :5.6 with index of 2.3  Arterial Sat: 94 PA Sat:65.  LV pressure:158/19/17 Aortic pressure:153/90/115  Angiography   Left Main:The left main has minor luminal regularities but no significant stenosis.  Left anterior Descending:The LAD has diffuse mild right allergies. There is a stent in the mid LAD which is widely patent. The distal LAD has minor luminal irregularities. There is a 40-50% stenosis in the very distal aspect of the LAD. The first diagonal branch is a fairly large branch. There are minor little regular disease but no significant stenosis. The second diagonal artery is a moderate-sized vessel. There is a 50% focal stenosis.  Left Circumflex:The left  circumflex artery is a moderate-sized vessel. There are minor luminal irregularities  Right Coronary Artery:The right coronary artery is large and dominant. There are diffuse irregularities throughout the RCA. There is a 40% stenosis in the distal RCA. This is followed by a focal 50% stenosis just prior to the bifurcation of 2 posterior lateral branches. This lesion does not appear to obstruct flow.  LV Gram:The left ventricular function is normal. Ejection fraction is roughly 65%.  Complications: No apparent complications Patient did tolerate procedure well.  Conclusions: 1. Mild to moderate coronary artery disease. The stent in the LAD is widely patent. There are no significant irregularities to explain his symptoms. 2. Normal left systolic function. Essentially normal right heart  pressures. 3. Moderate to severe hypertension. I suspect that this is the cause of the shortness of breath. He will need better blood pressure control 4. Poorly controlled diabetes. His glucose here in the lab was 329. He's on oral hypoglycemics. He will likely need to be started on insulin. We will defer to his medical Dr.   Judd Gaudier and Plan  1. CAD - no significant symptoms, continue current meds  2. HTN - above goal, increase coreg to 25mg  bid  3. SOB - recent cardaic testing as reported above fairly benign - recommended PFTs, he would like to hold off for now  4. AAA screen - male over 76 with tobacco history, order AAA screening US        Arnoldo Lenis, M.D.

## 2020-05-08 NOTE — Patient Instructions (Signed)
Your physician recommends that you schedule a follow-up appointment in: [place] has recommended you make the following change in your medication:   [facility] physician has requested that you have an abdominal aorta duplex. During this test, an ultrasound is used to evaluate the aorta. Allow 30 minutes for this exam. Do not eat after midnight the day before and avoid carbonated beverages  Thank you for choosing [facility]!!

## 2020-05-12 DIAGNOSIS — E7849 Other hyperlipidemia: Secondary | ICD-10-CM | POA: Diagnosis not present

## 2020-05-12 DIAGNOSIS — I5032 Chronic diastolic (congestive) heart failure: Secondary | ICD-10-CM | POA: Diagnosis not present

## 2020-05-12 DIAGNOSIS — E11649 Type 2 diabetes mellitus with hypoglycemia without coma: Secondary | ICD-10-CM | POA: Diagnosis not present

## 2020-05-12 DIAGNOSIS — I110 Hypertensive heart disease with heart failure: Secondary | ICD-10-CM | POA: Diagnosis not present

## 2020-06-11 ENCOUNTER — Other Ambulatory Visit: Payer: Self-pay | Admitting: Family Medicine

## 2020-06-18 ENCOUNTER — Other Ambulatory Visit: Payer: Self-pay | Admitting: "Endocrinology

## 2020-07-02 DIAGNOSIS — E7849 Other hyperlipidemia: Secondary | ICD-10-CM | POA: Diagnosis not present

## 2020-07-02 DIAGNOSIS — E782 Mixed hyperlipidemia: Secondary | ICD-10-CM | POA: Diagnosis not present

## 2020-07-02 DIAGNOSIS — I10 Essential (primary) hypertension: Secondary | ICD-10-CM | POA: Diagnosis not present

## 2020-07-02 DIAGNOSIS — Z125 Encounter for screening for malignant neoplasm of prostate: Secondary | ICD-10-CM | POA: Diagnosis not present

## 2020-07-02 DIAGNOSIS — E11649 Type 2 diabetes mellitus with hypoglycemia without coma: Secondary | ICD-10-CM | POA: Diagnosis not present

## 2020-07-07 DIAGNOSIS — I5032 Chronic diastolic (congestive) heart failure: Secondary | ICD-10-CM | POA: Diagnosis not present

## 2020-07-07 DIAGNOSIS — Z0001 Encounter for general adult medical examination with abnormal findings: Secondary | ICD-10-CM | POA: Diagnosis not present

## 2020-07-07 DIAGNOSIS — I472 Ventricular tachycardia: Secondary | ICD-10-CM | POA: Diagnosis not present

## 2020-07-07 DIAGNOSIS — Z23 Encounter for immunization: Secondary | ICD-10-CM | POA: Diagnosis not present

## 2020-07-07 DIAGNOSIS — I10 Essential (primary) hypertension: Secondary | ICD-10-CM | POA: Diagnosis not present

## 2020-07-07 DIAGNOSIS — K760 Fatty (change of) liver, not elsewhere classified: Secondary | ICD-10-CM | POA: Diagnosis not present

## 2020-07-07 DIAGNOSIS — E1122 Type 2 diabetes mellitus with diabetic chronic kidney disease: Secondary | ICD-10-CM | POA: Diagnosis not present

## 2020-07-07 DIAGNOSIS — E7849 Other hyperlipidemia: Secondary | ICD-10-CM | POA: Diagnosis not present

## 2020-07-08 ENCOUNTER — Other Ambulatory Visit: Payer: Self-pay

## 2020-07-08 ENCOUNTER — Ambulatory Visit (INDEPENDENT_AMBULATORY_CARE_PROVIDER_SITE_OTHER): Payer: Medicare Other | Admitting: "Endocrinology

## 2020-07-08 ENCOUNTER — Encounter: Payer: Self-pay | Admitting: "Endocrinology

## 2020-07-08 VITALS — BP 140/66 | HR 84 | Ht 75.0 in | Wt 290.6 lb

## 2020-07-08 DIAGNOSIS — E782 Mixed hyperlipidemia: Secondary | ICD-10-CM | POA: Diagnosis not present

## 2020-07-08 DIAGNOSIS — I2510 Atherosclerotic heart disease of native coronary artery without angina pectoris: Secondary | ICD-10-CM

## 2020-07-08 DIAGNOSIS — E039 Hypothyroidism, unspecified: Secondary | ICD-10-CM

## 2020-07-08 DIAGNOSIS — E1159 Type 2 diabetes mellitus with other circulatory complications: Secondary | ICD-10-CM

## 2020-07-08 DIAGNOSIS — I10 Essential (primary) hypertension: Secondary | ICD-10-CM

## 2020-07-08 NOTE — Patient Instructions (Signed)

## 2020-07-08 NOTE — Progress Notes (Signed)
07/08/2020   Endocrinology follow-up note    Subjective:    Patient ID: Alex Cortez, male    DOB: [birth year], PCP Burdine, Virgina Evener, MD   Past Medical History:  Diagnosis Date  . CAD (coronary artery disease)    nonobstructive CAD/patent LAD stent site; EF 65%, 08/2011; NSTEMI/DES LAD, 6/20 11  . Chest pain   . Diabetes mellitus    insulin-dependent  . Dyslipidemia   . HTN (hypertension)   . Post MI syndrome ([place]) 08-29-2009  . Stroke [facility]) 10/22/2012   Left inferior pons   Past Surgical History:  Procedure Laterality Date  . CORONARY ANGIOPLASTY WITH STENT PLACEMENT     Social History   Socioeconomic History  . Marital status: Married    Spouse name: Not on file  . Number of children: Not on file  . Years of education: Not on file  . Highest education level: Not on file  Occupational History  . Not on file  Tobacco Use  . Smoking status: Former Smoker    Packs/day: 0.50    Years: 23.00    Pack years: 11.50    Types: Cigarettes    Start date: 03/16/1955    Quit date: 03/15/1988    Years since quitting: 32.3  . Smokeless tobacco: Never Used  Vaping Use  . Vaping Use: Never used  Substance and Sexual Activity  . Alcohol use: No    Alcohol/week: 0.0 standard drinks  . Drug use: No  . Sexual activity: Not on file  Other Topics Concern  . Not on file  Social History Narrative   Works at [employer]. Married.    Social Determinants of Health   Financial Resource Strain: Not on file  Food Insecurity: Not on file  Transportation Needs: Not on file  Physical Activity: Not on file  Stress: Not on file  Social Connections: Not on file   Outpatient Encounter Medications as of 07/08/2020  Medication Sig  . amLODipine (NORVASC) 10 MG tablet Take 10 mg by mouth daily. for high blood pressure  . atorvastatin (LIPITOR) 80 MG tablet Take 1 tablet (80 mg total) by mouth every evening.  . benazepril (LOTENSIN) 40 MG tablet Take 1 tablet (40 mg total) by mouth daily.   . carvedilol (COREG) 25 MG tablet Take 1 tablet (25 mg total) by mouth 2 (two) times daily.  . clopidogrel (PLAVIX) 75 MG tablet Take 1 tablet (75 mg total) by mouth daily.  . Continuous Blood Gluc Sensor (FREESTYLE LIBRE 14 DAY SENSOR) MISC USE AND DISCARD 1 SENSOR   EVERY 14 DAYS  . fluticasone (FLONASE) 50 MCG/ACT nasal spray Place 2 sprays into both nostrils daily.  . furosemide (LASIX) 20 MG tablet Take 20 mg by mouth 2 (two) times daily.  Marland Kitchen glipiZIDE (GLUCOTROL XL) 5 MG 24 hr tablet TAKE 1 TABLET BY MOUTH EVERY DAY WITH BREAKFAST  . HUMULIN R U-500 KWIKPEN 500 UNIT/ML kwikpen INJECT AS DIRECTED 80 UNITS WITH BREAKFAST, 60 UNITS WITH LUNCH, AND 80 UNITS AT SUPPER FOR PRE-MEAL BLOOD GLUCOSE READINGS GREATER THAN 90  . isosorbide mononitrate (IMDUR) 30 MG 24 hr tablet TAKE 1 TABLET BY MOUTH EVERY DAY  . levothyroxine (SYNTHROID) 75 MCG tablet TAKE 1 TABLET BY MOUTH EVERY DAY BEFORE BREAKFAST  . metFORMIN (GLUCOPHAGE) 1000 MG tablet Take 500 mg by mouth daily with breakfast.  . nitroGLYCERIN (NITROSTAT) 0.4 MG SL tablet Place 1 tablet (0.4 mg total) under the tongue every 5 (five) minutes as needed.  Marland Kitchen  ONE TOUCH ULTRA TEST test strip USE TO TEST BLOOD SUGAR FOUR TIMES DAILY E11.65   No facility-administered encounter medications on file as of 07/08/2020.   ALLERGIES: No Known Allergies VACCINATION STATUS:  There is no immunization history on file for this patient.  Diabetes He presents for his follow-up diabetic visit. He has type 2 diabetes mellitus. Onset time: He was diagnosed at approximate age of 51 years. His disease course has been improving. There are no hypoglycemic associated symptoms. Pertinent negatives for hypoglycemia include no confusion, headaches, pallor or seizures. Pertinent negatives for diabetes include no chest pain, no fatigue, no polydipsia, no polyphagia, no polyuria and no weakness. There are no hypoglycemic complications. Symptoms are improving. Diabetic  complications include a CVA, heart disease and nephropathy. Risk factors for coronary artery disease include diabetes mellitus, dyslipidemia, hypertension, male sex, obesity, sedentary lifestyle and tobacco exposure. Current diabetic treatment includes intensive insulin program. He is compliant with treatment most of the time. His weight is increasing steadily. He is following a generally unhealthy (He consumes a large quantity of soda.) diet. When asked about meal planning, he reported none. He has had a previous visit with a dietitian. He never participates in exercise. His home blood glucose trend is decreasing steadily. His breakfast blood glucose range is generally 140-180 mg/dl. His lunch blood glucose range is generally 140-180 mg/dl. His dinner blood glucose range is generally 140-180 mg/dl. His bedtime blood glucose range is generally 140-180 mg/dl. His overall blood glucose range is 140-180 mg/dl. (He presents with his CGM device showing 60% time in range, 38% slightly above range.  He does not have significant hypoglycemia.  His average blood glucose is 165 with estimated A1c of 7.3%.  However, his previsit labs show A1c of 8.6%, improving from 8.9%.  He is known to have discrepancy between his average blood glucose and A1c.   ) An ACE inhibitor/angiotensin II receptor blocker is being taken. Eye exam is current.  Hyperlipidemia This is a chronic problem. The current episode started more than 1 year ago. The problem is controlled. Exacerbating diseases include chronic renal disease, diabetes and obesity. Pertinent negatives include no chest pain, leg pain, myalgias or shortness of breath. Current antihyperlipidemic treatment includes statins. Risk factors for coronary artery disease include diabetes mellitus, dyslipidemia, hypertension, male sex, obesity and a sedentary lifestyle.  Hypertension This is a chronic problem. The current episode started more than 1 year ago. The problem is controlled.  Pertinent negatives include no chest pain, headaches, neck pain, palpitations or shortness of breath. Risk factors for coronary artery disease include obesity, male gender, dyslipidemia, diabetes mellitus, sedentary lifestyle and family history. Past treatments include ACE inhibitors. Hypertensive end-organ damage includes kidney disease and CVA. Identifiable causes of hypertension include chronic renal disease.     Review of systems  Constitutional: + Gaining weight,  current  Body mass index is 36.32 kg/m. , no fatigue, no subjective hyperthermia, no subjective hypothermia    Objective:    BP 140/66   Pulse 84   Ht 6\' 3"  (1.905 m)   Wt 290 lb 9.6 oz (131.8 kg)   BMI 36.32 kg/m   Wt Readings from Last 3 Encounters:  07/08/20 290 lb 9.6 oz (131.8 kg)  05/08/20 287 lb (130.2 kg)  04/09/20 285 lb 9.6 oz (129.5 kg)      Physical Exam- Limited  Constitutional:  Body mass index is 36.32 kg/m. , not in acute distress, normal state of mind Eyes:  EOMI, no exophthalmos Neck:  Supple  Respiratory: Adequate breathing efforts Musculoskeletal: no gross deformities, strength intact in all four extremities, no gross restriction of joint movements Skin:  no rashes, no hyperemia, + lipodystrophy on abdominal skin Neurological: no tremor with outstretched hands    CMP Latest Ref Rng & Units 01/01/2020 01/19/2019 09/25/2018  Glucose 65 - 99 mg/dL 253(H) - 227(H)  BUN 7 - 25 mg/dL 13 12 16   Creatinine 0.70 - 1.18 mg/dL 1.15 1.0 1.05  Sodium 135 - 146 mmol/L 138 - 139  Potassium 3.5 - 5.3 mmol/L 4.3 - 4.6  Chloride 98 - 110 mmol/L 103 - 104  CO2 20 - 32 mmol/L 28 - 29  Calcium 8.6 - 10.3 mg/dL 8.9 - 9.1  Total Protein 6.1 - 8.1 g/dL 7.5 - 7.5  Total Bilirubin 0.2 - 1.2 mg/dL 0.7 - 0.7  Alkaline Phos 38 - 126 U/L - - -  AST 10 - 35 U/L 20 - 17  ALT 9 - 46 U/L 29 - 30    No results found for this or any previous visit (from the past 2160 hour(s)). Lipid Panel     Component Value  Date/Time   CHOL 128 01/19/2019 0000   TRIG 98 01/19/2019 0000   HDL 36 01/19/2019 0000   CHOLHDL 4.5 08/01/2017 0243   VLDL 20 08/01/2017 0243   LDLCALC 73 01/19/2019 0000     Assessment & Plan:   1. Type 2 diabetes mellitus with vascular disease (HCC)  -His  diabetes is complicated by recurrent CVA , coronary artery disease, obesity/sedentary life and patient remains at extremely  high risk for more acute and chronic complications of diabetes which include CAD, CVA, CKD, retinopathy, and neuropathy. These are all discussed in detail with the patient.  He presents with his CGM device showing 60% time in range, 38% slightly above range.  He does not have significant hypoglycemia.  His average blood glucose is 165 with estimated A1c of 7.3%.  However, his previsit labs show A1c of 8.6%, improving from 8.9%.  He is known to have discrepancy between his average blood glucose and A1c.    - Glucose logs and insulin administration records pertaining to this visit,  to be scanned into patient's records.  Recent labs reviewed.  - I have re-counseled the patient on diet management and weight loss  by adopting a carbohydrate restricted / protein rich  Diet.  - he acknowledges that there is a room for improvement in his food and drink choices. - Suggestion is made for him to avoid simple carbohydrates  from his diet including Cakes, Sweet Desserts, Ice Cream, Soda (diet and regular), Sweet Tea, Candies, Chips, Cookies, Store Bought Juices, Alcohol in Excess of  1-2 drinks a day, Artificial Sweeteners,  Coffee Creamer, and "Sugar-free" Products, Lemonade. This will help patient to have more stable blood glucose profile and potentially avoid unintended weight gain.   - Patient is advised to stick to a routine mealtimes to eat 3 meals  a day and avoid unnecessary snacks ( to snack only to correct hypoglycemia).  - I have approached patient with the following individualized plan to manage diabetes and  patient agrees.  -He continues to respond to U500 with better average blood glucose results.    -He is advised to continue Humulin U500 80 units with breakfast, 60 units with lunch, and 80 units with supper  for pre-meal blood glucose readings of 90 mg/dL or above.   -I have advised him on how he can rotate  insulin injection sites on his abdominal skin. -He is warned not not to take insulin without proper monitoring of blood glucose.  He is advised to  call clinic if he registers blood glucose less than 70 mg/dL or greater than 200 mg/dL. -He is benefiting from low-dose metformin and glipizide.  He is advised to continue metformin 500 mg p.o. daily at breakfast as well as glipizide 5 mg p.o. daily at breakfast.   -This patient would have benefited  from bariatric surgery.  I discussed and gave him the information brochure and contact numbers to previous visits.  He did not initiate any process.  He now qualifies with a BMI of 36.3,  and he has several comorbidities which increases his chance of getting coverage .  - Patient specific target  for A1c; LDL, HDL, Triglycerides, and  Waist Circumference were discussed in detail.  2) BP/HTN: -His blood pressure is controlled to target.   He is advised to increase his benazepril to 40 mg p.o. daily.    3) Lipids/HPL: His lipid panel shows controlled LDL 73.    He is advised to continue atorvastatin 80 mg p.o. nightly.   Side effects and precautions discussed with him.   4)  Weight/Diet: His BMI is 76.28--BTDVVOH complicating his diabetes care.  Marland Kitchen  Has no success for weight control mainly due to his dietary indiscretion.  He has consulted with CDE.  He is advised on weight loss surgery.  See above.  5) hypothyroidism- His recent  thyroid function tests are consistent with appropriate replacement.    He is advised to continue levothyroxine to 75 mcg p.o. every morning  - We discussed about the correct intake of his thyroid hormone, on empty stomach at  fasting, with water, separated by at least 30 minutes from breakfast and other medications,  and separated by more than 4 hours from calcium, iron, multivitamins, acid reflux medications (PPIs). -Patient is made aware of the fact that thyroid hormone replacement is needed for life, dose to be adjusted by periodic monitoring of thyroid function tests.   6) Chronic Care/Health Maintenance:  -Patient  is  on ACEI/ARB and Statin medications and encouraged to continue to follow up with Ophthalmology, urologist given his recent recurrent CVA, podiatrist at least yearly or according to recommendations, and advised to  stay away from smoking. I have recommended yearly flu vaccine and pneumonia vaccination at least every 5 years; moderate intensity exercise for up to 150 minutes weekly; and  sleep for at least 7 hours a day.  POCT ABI was normal on April 09, 2020, next study will be repeated in January 2027, or sooner if needed.    - I advised patient to maintain close follow up with Burdine, Virgina Evener, MD for primary care needs.   I spent 44 minutes in the care of the patient today including review of labs from [place], Lipids, Thyroid Function, Hematology (current and previous including abstractions from other facilities); face-to-face time discussing  his blood glucose readings/logs, discussing hypoglycemia and hyperglycemia episodes and symptoms, medications doses, his options of short and long term treatment based on the latest standards of care / guidelines;  discussion about incorporating lifestyle medicine;  and documenting the encounter.    Please refer to Patient Instructions for Blood Glucose Monitoring and Insulin/Medications Dosing Guide"  in media tab for additional information. Please  also refer to " Patient Self Inventory" in the Media  tab for reviewed elements of pertinent patient history.  Alex Cortez  participated in the discussions, expressed understanding, and voiced agreement with  the above plans.  All questions were answered to his satisfaction. he is encouraged to contact clinic should he have any questions or concerns prior to his return visit.    Follow up plan: -Return in about 4 months (around 11/07/2020).  Glade Lloyd, MD Phone: [phone]  Fax: [phone]  -  This note was partially dictated with voice recognition software. Similar sounding words can be transcribed inadequately or may not  be corrected upon review.  07/08/2020, 9:56 AM

## 2020-07-29 DIAGNOSIS — Z23 Encounter for immunization: Secondary | ICD-10-CM | POA: Diagnosis not present

## 2020-08-11 DIAGNOSIS — I5032 Chronic diastolic (congestive) heart failure: Secondary | ICD-10-CM | POA: Diagnosis not present

## 2020-08-11 DIAGNOSIS — I110 Hypertensive heart disease with heart failure: Secondary | ICD-10-CM | POA: Diagnosis not present

## 2020-08-11 DIAGNOSIS — E7849 Other hyperlipidemia: Secondary | ICD-10-CM | POA: Diagnosis not present

## 2020-08-11 DIAGNOSIS — E11649 Type 2 diabetes mellitus with hypoglycemia without coma: Secondary | ICD-10-CM | POA: Diagnosis not present

## 2020-08-13 ENCOUNTER — Other Ambulatory Visit: Payer: Self-pay | Admitting: "Endocrinology

## 2020-08-14 ENCOUNTER — Other Ambulatory Visit: Payer: Self-pay | Admitting: "Endocrinology

## 2020-08-15 ENCOUNTER — Other Ambulatory Visit: Payer: Self-pay | Admitting: *Deleted

## 2020-08-15 MED ORDER — CARVEDILOL 25 MG PO TABS: 25.0000 mg | ORAL_TABLET | Freq: Two times a day (BID) | ORAL | 1 refills | Status: DC

## 2020-08-29 ENCOUNTER — Other Ambulatory Visit: Payer: Self-pay | Admitting: "Endocrinology

## 2020-09-05 DIAGNOSIS — Z20828 Contact with and (suspected) exposure to other viral communicable diseases: Secondary | ICD-10-CM | POA: Diagnosis not present

## 2020-09-11 DIAGNOSIS — I110 Hypertensive heart disease with heart failure: Secondary | ICD-10-CM | POA: Diagnosis not present

## 2020-09-11 DIAGNOSIS — E7849 Other hyperlipidemia: Secondary | ICD-10-CM | POA: Diagnosis not present

## 2020-09-11 DIAGNOSIS — E11649 Type 2 diabetes mellitus with hypoglycemia without coma: Secondary | ICD-10-CM | POA: Diagnosis not present

## 2020-09-11 DIAGNOSIS — I5032 Chronic diastolic (congestive) heart failure: Secondary | ICD-10-CM | POA: Diagnosis not present

## 2020-09-22 ENCOUNTER — Other Ambulatory Visit: Payer: Self-pay | Admitting: "Endocrinology

## 2020-09-26 ENCOUNTER — Other Ambulatory Visit: Payer: Self-pay | Admitting: Cardiology

## 2020-10-07 DIAGNOSIS — E1122 Type 2 diabetes mellitus with diabetic chronic kidney disease: Secondary | ICD-10-CM | POA: Diagnosis not present

## 2020-10-07 DIAGNOSIS — E7849 Other hyperlipidemia: Secondary | ICD-10-CM | POA: Diagnosis not present

## 2020-10-07 DIAGNOSIS — E782 Mixed hyperlipidemia: Secondary | ICD-10-CM | POA: Diagnosis not present

## 2020-10-07 DIAGNOSIS — N183 Chronic kidney disease, stage 3 unspecified: Secondary | ICD-10-CM | POA: Diagnosis not present

## 2020-10-07 DIAGNOSIS — I10 Essential (primary) hypertension: Secondary | ICD-10-CM | POA: Diagnosis not present

## 2020-10-07 DIAGNOSIS — E1165 Type 2 diabetes mellitus with hyperglycemia: Secondary | ICD-10-CM | POA: Diagnosis not present

## 2020-10-07 DIAGNOSIS — Z1329 Encounter for screening for other suspected endocrine disorder: Secondary | ICD-10-CM | POA: Diagnosis not present

## 2020-10-09 DIAGNOSIS — E7849 Other hyperlipidemia: Secondary | ICD-10-CM | POA: Diagnosis not present

## 2020-10-09 DIAGNOSIS — K760 Fatty (change of) liver, not elsewhere classified: Secondary | ICD-10-CM | POA: Diagnosis not present

## 2020-10-09 DIAGNOSIS — I472 Ventricular tachycardia: Secondary | ICD-10-CM | POA: Diagnosis not present

## 2020-10-09 DIAGNOSIS — Z955 Presence of coronary angioplasty implant and graft: Secondary | ICD-10-CM | POA: Diagnosis not present

## 2020-10-09 DIAGNOSIS — R0609 Other forms of dyspnea: Secondary | ICD-10-CM | POA: Diagnosis not present

## 2020-10-09 DIAGNOSIS — E1122 Type 2 diabetes mellitus with diabetic chronic kidney disease: Secondary | ICD-10-CM | POA: Diagnosis not present

## 2020-10-09 DIAGNOSIS — I10 Essential (primary) hypertension: Secondary | ICD-10-CM | POA: Diagnosis not present

## 2020-10-09 DIAGNOSIS — I5032 Chronic diastolic (congestive) heart failure: Secondary | ICD-10-CM | POA: Diagnosis not present

## 2020-10-23 DIAGNOSIS — Z8673 Personal history of transient ischemic attack (TIA), and cerebral infarction without residual deficits: Secondary | ICD-10-CM | POA: Diagnosis not present

## 2020-10-23 DIAGNOSIS — I2510 Atherosclerotic heart disease of native coronary artery without angina pectoris: Secondary | ICD-10-CM | POA: Diagnosis not present

## 2020-10-23 DIAGNOSIS — T781XXA Other adverse food reactions, not elsewhere classified, initial encounter: Secondary | ICD-10-CM | POA: Diagnosis not present

## 2020-10-23 DIAGNOSIS — Z7984 Long term (current) use of oral hypoglycemic drugs: Secondary | ICD-10-CM | POA: Diagnosis not present

## 2020-10-23 DIAGNOSIS — I472 Ventricular tachycardia: Secondary | ICD-10-CM | POA: Diagnosis not present

## 2020-10-23 DIAGNOSIS — E1165 Type 2 diabetes mellitus with hyperglycemia: Secondary | ICD-10-CM | POA: Diagnosis not present

## 2020-10-23 DIAGNOSIS — Z79890 Hormone replacement therapy (postmenopausal): Secondary | ICD-10-CM | POA: Diagnosis not present

## 2020-10-23 DIAGNOSIS — L509 Urticaria, unspecified: Secondary | ICD-10-CM | POA: Diagnosis not present

## 2020-10-23 DIAGNOSIS — U071 COVID-19: Secondary | ICD-10-CM | POA: Diagnosis not present

## 2020-10-23 DIAGNOSIS — Z6835 Body mass index (BMI) 35.0-35.9, adult: Secondary | ICD-10-CM | POA: Diagnosis not present

## 2020-10-23 DIAGNOSIS — E119 Type 2 diabetes mellitus without complications: Secondary | ICD-10-CM | POA: Diagnosis not present

## 2020-10-23 DIAGNOSIS — X58XXXA Exposure to other specified factors, initial encounter: Secondary | ICD-10-CM | POA: Diagnosis not present

## 2020-10-23 DIAGNOSIS — Z713 Dietary counseling and surveillance: Secondary | ICD-10-CM | POA: Diagnosis not present

## 2020-10-23 DIAGNOSIS — R0602 Shortness of breath: Secondary | ICD-10-CM | POA: Diagnosis not present

## 2020-10-23 DIAGNOSIS — I10 Essential (primary) hypertension: Secondary | ICD-10-CM | POA: Diagnosis not present

## 2020-10-23 DIAGNOSIS — Z7902 Long term (current) use of antithrombotics/antiplatelets: Secondary | ICD-10-CM | POA: Diagnosis not present

## 2020-10-23 DIAGNOSIS — N179 Acute kidney failure, unspecified: Secondary | ICD-10-CM | POA: Diagnosis not present

## 2020-10-23 DIAGNOSIS — I088 Other rheumatic multiple valve diseases: Secondary | ICD-10-CM | POA: Diagnosis not present

## 2020-10-23 DIAGNOSIS — T783XXA Angioneurotic edema, initial encounter: Secondary | ICD-10-CM | POA: Diagnosis not present

## 2020-10-23 DIAGNOSIS — E039 Hypothyroidism, unspecified: Secondary | ICD-10-CM | POA: Diagnosis not present

## 2020-10-23 DIAGNOSIS — Z79899 Other long term (current) drug therapy: Secondary | ICD-10-CM | POA: Diagnosis not present

## 2020-10-24 DIAGNOSIS — Z713 Dietary counseling and surveillance: Secondary | ICD-10-CM | POA: Diagnosis not present

## 2020-10-24 DIAGNOSIS — Z794 Long term (current) use of insulin: Secondary | ICD-10-CM | POA: Diagnosis not present

## 2020-10-24 DIAGNOSIS — Z79899 Other long term (current) drug therapy: Secondary | ICD-10-CM | POA: Diagnosis not present

## 2020-10-24 DIAGNOSIS — I088 Other rheumatic multiple valve diseases: Secondary | ICD-10-CM | POA: Diagnosis present

## 2020-10-24 DIAGNOSIS — T781XXA Other adverse food reactions, not elsewhere classified, initial encounter: Secondary | ICD-10-CM | POA: Diagnosis not present

## 2020-10-24 DIAGNOSIS — Z7984 Long term (current) use of oral hypoglycemic drugs: Secondary | ICD-10-CM | POA: Diagnosis not present

## 2020-10-24 DIAGNOSIS — E119 Type 2 diabetes mellitus without complications: Secondary | ICD-10-CM | POA: Diagnosis not present

## 2020-10-24 DIAGNOSIS — I10 Essential (primary) hypertension: Secondary | ICD-10-CM | POA: Diagnosis present

## 2020-10-24 DIAGNOSIS — Z6835 Body mass index (BMI) 35.0-35.9, adult: Secondary | ICD-10-CM | POA: Diagnosis not present

## 2020-10-24 DIAGNOSIS — I2510 Atherosclerotic heart disease of native coronary artery without angina pectoris: Secondary | ICD-10-CM | POA: Diagnosis present

## 2020-10-24 DIAGNOSIS — Z7902 Long term (current) use of antithrombotics/antiplatelets: Secondary | ICD-10-CM | POA: Diagnosis not present

## 2020-10-24 DIAGNOSIS — N179 Acute kidney failure, unspecified: Secondary | ICD-10-CM | POA: Diagnosis present

## 2020-10-24 DIAGNOSIS — E039 Hypothyroidism, unspecified: Secondary | ICD-10-CM | POA: Diagnosis present

## 2020-10-24 DIAGNOSIS — L509 Urticaria, unspecified: Secondary | ICD-10-CM | POA: Diagnosis not present

## 2020-10-24 DIAGNOSIS — U071 COVID-19: Secondary | ICD-10-CM | POA: Diagnosis present

## 2020-10-24 DIAGNOSIS — Z8673 Personal history of transient ischemic attack (TIA), and cerebral infarction without residual deficits: Secondary | ICD-10-CM | POA: Diagnosis not present

## 2020-10-24 DIAGNOSIS — X58XXXA Exposure to other specified factors, initial encounter: Secondary | ICD-10-CM | POA: Diagnosis not present

## 2020-10-24 DIAGNOSIS — T783XXA Angioneurotic edema, initial encounter: Secondary | ICD-10-CM | POA: Diagnosis present

## 2020-10-24 DIAGNOSIS — E1165 Type 2 diabetes mellitus with hyperglycemia: Secondary | ICD-10-CM | POA: Diagnosis present

## 2020-10-24 DIAGNOSIS — Z79890 Hormone replacement therapy (postmenopausal): Secondary | ICD-10-CM | POA: Diagnosis not present

## 2020-10-25 DIAGNOSIS — E119 Type 2 diabetes mellitus without complications: Secondary | ICD-10-CM | POA: Diagnosis not present

## 2020-10-25 DIAGNOSIS — E039 Hypothyroidism, unspecified: Secondary | ICD-10-CM | POA: Diagnosis not present

## 2020-10-25 DIAGNOSIS — U071 COVID-19: Secondary | ICD-10-CM | POA: Diagnosis not present

## 2020-10-25 DIAGNOSIS — Z79899 Other long term (current) drug therapy: Secondary | ICD-10-CM | POA: Diagnosis not present

## 2020-10-25 DIAGNOSIS — Z7902 Long term (current) use of antithrombotics/antiplatelets: Secondary | ICD-10-CM | POA: Diagnosis not present

## 2020-10-25 DIAGNOSIS — Z79890 Hormone replacement therapy (postmenopausal): Secondary | ICD-10-CM | POA: Diagnosis not present

## 2020-10-25 DIAGNOSIS — T783XXA Angioneurotic edema, initial encounter: Secondary | ICD-10-CM | POA: Diagnosis not present

## 2020-10-25 DIAGNOSIS — I2510 Atherosclerotic heart disease of native coronary artery without angina pectoris: Secondary | ICD-10-CM | POA: Diagnosis not present

## 2020-10-25 DIAGNOSIS — N179 Acute kidney failure, unspecified: Secondary | ICD-10-CM | POA: Diagnosis not present

## 2020-10-25 DIAGNOSIS — Z8673 Personal history of transient ischemic attack (TIA), and cerebral infarction without residual deficits: Secondary | ICD-10-CM | POA: Diagnosis not present

## 2020-10-31 DIAGNOSIS — I5032 Chronic diastolic (congestive) heart failure: Secondary | ICD-10-CM | POA: Diagnosis not present

## 2020-10-31 DIAGNOSIS — Z20828 Contact with and (suspected) exposure to other viral communicable diseases: Secondary | ICD-10-CM | POA: Diagnosis not present

## 2020-10-31 DIAGNOSIS — T783XXA Angioneurotic edema, initial encounter: Secondary | ICD-10-CM | POA: Diagnosis not present

## 2020-10-31 DIAGNOSIS — T464X5A Adverse effect of angiotensin-converting-enzyme inhibitors, initial encounter: Secondary | ICD-10-CM | POA: Diagnosis not present

## 2020-10-31 DIAGNOSIS — K760 Fatty (change of) liver, not elsewhere classified: Secondary | ICD-10-CM | POA: Diagnosis not present

## 2020-10-31 DIAGNOSIS — E7849 Other hyperlipidemia: Secondary | ICD-10-CM | POA: Diagnosis not present

## 2020-10-31 DIAGNOSIS — N183 Chronic kidney disease, stage 3 unspecified: Secondary | ICD-10-CM | POA: Diagnosis not present

## 2020-10-31 DIAGNOSIS — N179 Acute kidney failure, unspecified: Secondary | ICD-10-CM | POA: Diagnosis not present

## 2020-10-31 DIAGNOSIS — Z955 Presence of coronary angioplasty implant and graft: Secondary | ICD-10-CM | POA: Diagnosis not present

## 2020-10-31 DIAGNOSIS — E782 Mixed hyperlipidemia: Secondary | ICD-10-CM | POA: Diagnosis not present

## 2020-10-31 DIAGNOSIS — U071 COVID-19: Secondary | ICD-10-CM | POA: Diagnosis not present

## 2020-10-31 DIAGNOSIS — I10 Essential (primary) hypertension: Secondary | ICD-10-CM | POA: Diagnosis not present

## 2020-10-31 DIAGNOSIS — E1122 Type 2 diabetes mellitus with diabetic chronic kidney disease: Secondary | ICD-10-CM | POA: Diagnosis not present

## 2020-11-03 ENCOUNTER — Other Ambulatory Visit: Payer: Self-pay | Admitting: "Endocrinology

## 2020-11-03 ENCOUNTER — Telehealth: Payer: Self-pay

## 2020-11-03 MED ORDER — FREESTYLE LIBRE 14 DAY SENSOR MISC: 0 refills | Status: DC

## 2020-11-03 NOTE — Telephone Encounter (Signed)
Patient is in [place] and his [employer] broke. Can you send in a [employer] Sensor to [facility] [address], [place]. Phone Number [phone].

## 2020-11-03 NOTE — Telephone Encounter (Signed)
sent 

## 2020-11-10 ENCOUNTER — Ambulatory Visit (INDEPENDENT_AMBULATORY_CARE_PROVIDER_SITE_OTHER): Payer: Medicare Other | Admitting: "Endocrinology

## 2020-11-10 ENCOUNTER — Other Ambulatory Visit: Payer: Self-pay

## 2020-11-10 ENCOUNTER — Encounter: Payer: Self-pay | Admitting: "Endocrinology

## 2020-11-10 VITALS — BP 131/71 | HR 74 | Ht 75.0 in | Wt 284.2 lb

## 2020-11-10 DIAGNOSIS — E1159 Type 2 diabetes mellitus with other circulatory complications: Secondary | ICD-10-CM | POA: Diagnosis not present

## 2020-11-10 DIAGNOSIS — E039 Hypothyroidism, unspecified: Secondary | ICD-10-CM | POA: Diagnosis not present

## 2020-11-10 DIAGNOSIS — E782 Mixed hyperlipidemia: Secondary | ICD-10-CM | POA: Diagnosis not present

## 2020-11-10 DIAGNOSIS — I2510 Atherosclerotic heart disease of native coronary artery without angina pectoris: Secondary | ICD-10-CM

## 2020-11-10 DIAGNOSIS — I10 Essential (primary) hypertension: Secondary | ICD-10-CM | POA: Diagnosis not present

## 2020-11-10 MED ORDER — HUMULIN R U-500 KWIKPEN 500 UNIT/ML ~~LOC~~ SOPN: 80.0000 [IU] | PEN_INJECTOR | Freq: Three times a day (TID) | SUBCUTANEOUS | 1 refills | Status: DC

## 2020-11-10 NOTE — Progress Notes (Signed)
11/10/2020  Endocrinology follow-up note   Subjective:    Patient ID: Alex Cortez, male    DOB: 24-Jun-1950, PCP Pleas Koch Virgina Evener, MD   Past Medical History:  Diagnosis Date   CAD (coronary artery disease)    nonobstructive CAD/patent LAD stent site; EF 65%, 08/2011; NSTEMI/DES LAD, 6/20 11   Chest pain    Diabetes mellitus    insulin-dependent   Dyslipidemia    HTN (hypertension)    Post MI syndrome ([place]) 08-29-2009   Stroke ([place]) 10/22/2012   Left inferior pons   Past Surgical History:  Procedure Laterality Date   CORONARY ANGIOPLASTY WITH STENT PLACEMENT     Social History   Socioeconomic History   Marital status: Married    Spouse name: Not on file   Number of children: Not on file   Years of education: Not on file   Highest education level: Not on file  Occupational History   Not on file  Tobacco Use   Smoking status: Former    Packs/day: 0.50    Years: 23.00    Pack years: 11.50    Types: Cigarettes    Start date: 03/16/1955    Quit date: 03/15/1988    Years since quitting: 32.6   Smokeless tobacco: Never  Vaping Use   Vaping Use: Never used  Substance and Sexual Activity   Alcohol use: No    Alcohol/week: 0.0 standard drinks   Drug use: No   Sexual activity: Not on file  Other Topics Concern   Not on file  Social History Narrative   Works at [employer]. Married.    Social Determinants of Health   Financial Resource Strain: Not on file  Food Insecurity: Not on file  Transportation Needs: Not on file  Physical Activity: Not on file  Stress: Not on file  Social Connections: Not on file   Outpatient Encounter Medications as of 11/10/2020  Medication Sig   amLODipine (NORVASC) 10 MG tablet Take 10 mg by mouth daily. for high blood pressure   atorvastatin (LIPITOR) 80 MG tablet Take 1 tablet (80 mg total) by mouth every evening.   carvedilol (COREG) 25 MG tablet Take 1 tablet (25 mg total) by mouth 2 (two) times daily.   clopidogrel (PLAVIX) 75  MG tablet Take 1 tablet (75 mg total) by mouth daily.   Continuous Blood Gluc Sensor (FREESTYLE LIBRE 14 DAY SENSOR) MISC USE AND DISCARD 1 SENSOR EVERY 14 DAYS   fluticasone (FLONASE) 50 MCG/ACT nasal spray Place 2 sprays into both nostrils daily.   furosemide (LASIX) 20 MG tablet Take 20 mg by mouth 2 (two) times daily. (Patient not taking: Reported on 11/10/2020)   glipiZIDE (GLUCOTROL XL) 5 MG 24 hr tablet TAKE 1 TABLET BY MOUTH EVERY DAY WITH BREAKFAST   insulin regular human CONCENTRATED (HUMULIN R U-500 KWIKPEN) 500 UNIT/ML KwikPen Inject 80 Units into the skin 3 (three) times daily with meals.   isosorbide mononitrate (IMDUR) 30 MG 24 hr tablet TAKE 1 TABLET BY MOUTH EVERY DAY   levothyroxine (SYNTHROID) 75 MCG tablet TAKE 1 TABLET BY MOUTH EVERY DAY BEFORE BREAKFAST   losartan (COZAAR) 25 MG tablet Take 25 mg by mouth daily.   metFORMIN (GLUCOPHAGE) 1000 MG tablet Take 500 mg by mouth daily with breakfast.   nitroGLYCERIN (NITROSTAT) 0.4 MG SL tablet Place 1 tablet (0.4 mg total) under the tongue every 5 (five) minutes as needed.   ONE TOUCH ULTRA TEST test strip USE TO TEST BLOOD SUGAR  FOUR TIMES DAILY E11.65   [DISCONTINUED] benazepril (LOTENSIN) 40 MG tablet Take 1 tablet (40 mg total) by mouth daily.   [DISCONTINUED] HUMULIN R U-500 KWIKPEN 500 UNIT/ML kwikpen INJECT AS DIRECTED 80 UNITS WITH BREAKFAST, 60 UNITS WITH LUNCH, AND 80 UNITS AT SUPPER FOR PRE-MEAL BLOOD GLUCOSE READINGS GREATER THAN 90   No facility-administered encounter medications on file as of 11/10/2020.   ALLERGIES: No Known Allergies VACCINATION STATUS: Immunization History  Administered Date(s) Administered   Moderna Sars-Covid-2 Vaccination 04/19/2019, 05/17/2019    Diabetes He presents for his follow-up diabetic visit. He has type 2 diabetes mellitus. Onset time: He was diagnosed at approximate age of 1 years. His disease course has been worsening. There are no hypoglycemic associated symptoms. Pertinent  negatives for hypoglycemia include no confusion, headaches, pallor or seizures. Pertinent negatives for diabetes include no chest pain, no fatigue, no polydipsia, no polyphagia, no polyuria and no weakness. There are no hypoglycemic complications. Symptoms are worsening. Diabetic complications include a CVA, heart disease and nephropathy. Risk factors for coronary artery disease include diabetes mellitus, dyslipidemia, hypertension, male sex, obesity, sedentary lifestyle and tobacco exposure. Current diabetic treatment includes intensive insulin program. He is compliant with treatment most of the time. His weight is increasing steadily. He is following a generally unhealthy (He consumes a large quantity of soda.) diet. When asked about meal planning, he reported none. He has had a previous visit with a dietitian. He never participates in exercise. His home blood glucose trend is increasing steadily. His breakfast blood glucose range is generally 180-200 mg/dl. His lunch blood glucose range is generally 180-200 mg/dl. His dinner blood glucose range is generally 180-200 mg/dl. His bedtime blood glucose range is generally 180-200 mg/dl. His overall blood glucose range is 180-200 mg/dl. (He presents with his CGM device showing 37% time range, 57% above range.  He has average blood glucose of 193 for the last 14 days.  His previsit A1c is 8.9% in June 2022.  He did not document any major hypoglycemia.   ) An ACE inhibitor/angiotensin II receptor blocker is being taken. Eye exam is current.  Hyperlipidemia This is a chronic problem. The current episode started more than 1 year ago. The problem is controlled. Exacerbating diseases include chronic renal disease, diabetes and obesity. Pertinent negatives include no chest pain, leg pain, myalgias or shortness of breath. Current antihyperlipidemic treatment includes statins. Risk factors for coronary artery disease include diabetes mellitus, dyslipidemia, hypertension, male  sex, obesity and a sedentary lifestyle.  Hypertension This is a chronic problem. The current episode started more than 1 year ago. The problem is controlled. Pertinent negatives include no chest pain, headaches, neck pain, palpitations or shortness of breath. Risk factors for coronary artery disease include obesity, male gender, dyslipidemia, diabetes mellitus, sedentary lifestyle and family history. Past treatments include ACE inhibitors. Hypertensive end-organ damage includes kidney disease and CVA. Identifiable causes of hypertension include chronic renal disease.    Review of systems  Constitutional: + Gaining weight,  current  Body mass index is 35.52 kg/m. , no fatigue, no subjective hyperthermia, no subjective hypothermia    Objective:    BP 131/71   Pulse 74   Ht '6\' 3"'$  (1.905 m)   Wt 284 lb 3.2 oz (128.9 kg)   BMI 35.52 kg/m   Wt Readings from Last 3 Encounters:  11/10/20 284 lb 3.2 oz (128.9 kg)  07/08/20 290 lb 9.6 oz (131.8 kg)  05/08/20 287 lb (130.2 kg)      Physical  Exam- Limited  Constitutional:  Body mass index is 35.52 kg/m. , not in acute distress, normal state of mind Eyes:  EOMI, no exophthalmos Neck: Supple  Respiratory: Adequate breathing efforts Musculoskeletal: no gross deformities, strength intact in all four extremities, no gross restriction of joint movements Skin:  no rashes, no hyperemia, + lipodystrophy on abdominal skin Neurological: no tremor with outstretched hands    CMP Latest Ref Rng & Units 01/01/2020 01/19/2019 09/25/2018  Glucose 65 - 99 mg/dL 253(H) - 227(H)  BUN 7 - 25 mg/dL '13 12 16  '$ Creatinine 0.70 - 1.18 mg/dL 1.15 1.0 1.05  Sodium 135 - 146 mmol/L 138 - 139  Potassium 3.5 - 5.3 mmol/L 4.3 - 4.6  Chloride 98 - 110 mmol/L 103 - 104  CO2 20 - 32 mmol/L 28 - 29  Calcium 8.6 - 10.3 mg/dL 8.9 - 9.1  Total Protein 6.1 - 8.1 g/dL 7.5 - 7.5  Total Bilirubin 0.2 - 1.2 mg/dL 0.7 - 0.7  Alkaline Phos 38 - 126 U/L - - -  AST 10 - 35  U/L 20 - 17  ALT 9 - 46 U/L 29 - 30    No results found for this or any previous visit (from the past 2160 hour(s)). Lipid Panel     Component Value Date/Time   CHOL 128 01/19/2019 0000   TRIG 98 01/19/2019 0000   HDL 36 01/19/2019 0000   CHOLHDL 4.5 08/01/2017 0243   VLDL 20 08/01/2017 0243   LDLCALC 73 01/19/2019 0000     Assessment & Plan:   1. Type 2 diabetes mellitus with vascular disease (HCC)  -His  diabetes is complicated by recurrent CVA , coronary artery disease, obesity/sedentary life and patient remains at extremely  high risk for more acute and chronic complications of diabetes which include CAD, CVA, CKD, retinopathy, and neuropathy. These are all discussed in detail with the patient.  He presents with his CGM device showing 37% time range, 57% above range.  He has average blood glucose of 193 for the last 14 days.  His previsit A1c is 8.9% in June 2022.  He did not document any major hypoglycemia.     - Glucose logs and insulin administration records pertaining to this visit,  to be scanned into patient's records.  Recent labs reviewed.  - I have re-counseled the patient on diet management and weight loss  by adopting a carbohydrate restricted / protein rich  Diet.  - he acknowledges that there is a room for improvement in his food and drink choices. - Suggestion is made for him to avoid simple carbohydrates  from his diet including Cakes, Sweet Desserts, Ice Cream, Soda (diet and regular), Sweet Tea, Candies, Chips, Cookies, Store Bought Juices, Alcohol in Excess of  1-2 drinks a day, Artificial Sweeteners,  Coffee Creamer, and "Sugar-free" Products, Lemonade. This will help patient to have more stable blood glucose profile and potentially avoid unintended weight gain.   - Patient is advised to stick to a routine mealtimes to eat 3 meals  a day and avoid unnecessary snacks ( to snack only to correct hypoglycemia).  - I have approached patient with the following  individualized plan to manage diabetes and patient agrees.  -He continues to respond to U500 with better average blood glucose results.    -He is advised to increase Humulin U50 0 to 80 units with breakfast, 80 units with lunch, and 80 units with supper   for pre-meal blood glucose readings of 90 mg/dL  or above.   -I have advised him on how he can rotate insulin injection sites on his abdominal skin. -He is warned not not to take insulin without proper monitoring of blood glucose.  He is advised to  call clinic if he registers blood glucose less than 70 mg/dL or greater than 200 mg/dL. -He is benefiting from low-dose metformin and glipizide.  He is advised to continue metformin 500 mg p.o. daily at breakfast, as well as glipizide 5 mg XL p.o. daily at breakfast.   - Patient specific target  for A1c; LDL, HDL, Triglycerides, and  Waist Circumference were discussed in detail.  2) BP/HTN: -His blood pressure is controlled to target.  It appears that he had a reaction to benazepril with angioedema.  This medication was stopped and he is currently on losartan 25 mg p.o. daily at breakfast.     3) Lipids/HPL: His lipid panel shows controlled LDL 73.   He is advised to continue atorvastatin 80 mg p.o. nightly.  Side effects and precautions discussed with him.   4)  Weight/Diet: His BMI is  [id number] complicating his diabetes care.  Marland Kitchen  Has no success for weight control mainly due to his dietary indiscretion.  He has consulted with CDE.  He is advised on weight loss surgery.  See above.  5) hypothyroidism- His recent  thyroid function tests are consistent with appropriate replacement.  He is advised to continue levothyroxine 75 mcg p.o. daily before breakfast.    - We discussed about the correct intake of his thyroid hormone, on empty stomach at fasting, with water, separated by at least 30 minutes from breakfast and other medications,  and separated by more than 4 hours from calcium, iron,  multivitamins, acid reflux medications (PPIs). -Patient is made aware of the fact that thyroid hormone replacement is needed for life, dose to be adjusted by periodic monitoring of thyroid function tests.   6) Chronic Care/Health Maintenance:  -Patient  is  on ACEI/ARB and Statin medications and encouraged to continue to follow up with Ophthalmology, urologist given his recent recurrent CVA, podiatrist at least yearly or according to recommendations, and advised to  stay away from smoking. I have recommended yearly flu vaccine and pneumonia vaccination at least every 5 years; moderate intensity exercise for up to 150 minutes weekly; and  sleep for at least 7 hours a day.  POCT ABI was normal on April 09, 2020, next study will be repeated in January 2027, or sooner if needed.    - I advised patient to maintain close follow up with Burdine, Virgina Evener, MD for primary care needs.    I spent 40 minutes in the care of the patient today including review of labs from [place], Lipids, Thyroid Function, Hematology (current and previous including abstractions from other facilities); face-to-face time discussing  his blood glucose readings/logs, discussing hypoglycemia and hyperglycemia episodes and symptoms, medications doses, his options of short and long term treatment based on the latest standards of care / guidelines;  discussion about incorporating lifestyle medicine;  and documenting the encounter.    Please refer to Patient Instructions for Blood Glucose Monitoring and Insulin/Medications Dosing Guide"  in media tab for additional information. Please  also refer to " Patient Self Inventory" in the Media  tab for reviewed elements of pertinent patient history.  Marvene Staff Nguyen participated in the discussions, expressed understanding, and voiced agreement with the above plans.  All questions were answered to his satisfaction. he is encouraged to  contact clinic should he have any questions or concerns prior  to his return visit.    Follow up plan: -Return in about 3 months (around 02/10/2021) for Bring Meter and Logs- A1c in Office.  Glade Lloyd, MD Phone: [phone]  Fax: [phone]  -  This note was partially dictated with voice recognition software. Similar sounding words can be transcribed inadequately or may not  be corrected upon review.  11/10/2020, 10:06 AM

## 2020-11-10 NOTE — Patient Instructions (Signed)

## 2020-11-24 ENCOUNTER — Other Ambulatory Visit: Payer: Self-pay | Admitting: "Endocrinology

## 2020-11-24 DIAGNOSIS — I5032 Chronic diastolic (congestive) heart failure: Secondary | ICD-10-CM | POA: Diagnosis not present

## 2020-11-24 DIAGNOSIS — E1122 Type 2 diabetes mellitus with diabetic chronic kidney disease: Secondary | ICD-10-CM | POA: Diagnosis not present

## 2020-11-24 DIAGNOSIS — T783XXA Angioneurotic edema, initial encounter: Secondary | ICD-10-CM | POA: Diagnosis not present

## 2020-11-24 DIAGNOSIS — E7849 Other hyperlipidemia: Secondary | ICD-10-CM | POA: Diagnosis not present

## 2020-11-24 DIAGNOSIS — U071 COVID-19: Secondary | ICD-10-CM | POA: Diagnosis not present

## 2020-11-24 DIAGNOSIS — I10 Essential (primary) hypertension: Secondary | ICD-10-CM | POA: Diagnosis not present

## 2020-11-24 DIAGNOSIS — T464X5A Adverse effect of angiotensin-converting-enzyme inhibitors, initial encounter: Secondary | ICD-10-CM | POA: Diagnosis not present

## 2020-11-24 DIAGNOSIS — K760 Fatty (change of) liver, not elsewhere classified: Secondary | ICD-10-CM | POA: Diagnosis not present

## 2020-11-25 ENCOUNTER — Encounter: Payer: Self-pay | Admitting: Cardiology

## 2020-11-25 ENCOUNTER — Ambulatory Visit (INDEPENDENT_AMBULATORY_CARE_PROVIDER_SITE_OTHER): Payer: Medicare Other | Admitting: Cardiology

## 2020-11-25 VITALS — BP 117/71 | HR 81 | Ht 75.0 in | Wt 285.2 lb

## 2020-11-25 DIAGNOSIS — I10 Essential (primary) hypertension: Secondary | ICD-10-CM | POA: Diagnosis not present

## 2020-11-25 DIAGNOSIS — I2510 Atherosclerotic heart disease of native coronary artery without angina pectoris: Secondary | ICD-10-CM | POA: Diagnosis not present

## 2020-11-25 DIAGNOSIS — R0602 Shortness of breath: Secondary | ICD-10-CM

## 2020-11-25 DIAGNOSIS — Z136 Encounter for screening for cardiovascular disorders: Secondary | ICD-10-CM | POA: Diagnosis not present

## 2020-11-25 DIAGNOSIS — Z87891 Personal history of nicotine dependence: Secondary | ICD-10-CM | POA: Diagnosis not present

## 2020-11-25 NOTE — Addendum Note (Signed)
Addended by: Laurine Blazer on: 11/25/2020 02:03 PM   Modules accepted: Orders

## 2020-11-25 NOTE — Progress Notes (Signed)
Clinical Summary Alex Cortez is a 70 y.o.male seen today for follow up of the following medical problems.    1. CAD - prior stent to LAD in June 2011 - cath 2013 with patent stent and vessels 02/2018 echo: LVEF 60-65%, grade I dd - 06/2019 nuclear stress: inferior scar with mod peri-infarct ischemia vs artifact      - no chest pains. Chronic SOB unchanged - 10/2020 echo LVEF by pcp at [facility] with normal LVEF     2. HTN - he is compliant with meds     3. History of CVA - has been on plavix   4. DM2   5. NSVT -no recent symptoms   6. Tobacco history - no cough, no wheezing - wants to think about breathing tests - 2017 255 lbs - wants to hold off on PFTs  -continues to want to hold off on PFTs   AAA screen: over 65, smoking history. Has not had [place] -did not go for AAA ordered last visit  Past Medical History:  Diagnosis Date   CAD (coronary artery disease)    nonobstructive CAD/patent LAD stent site; EF 65%, 08/2011; NSTEMI/DES LAD, 6/20 11   Chest pain    Diabetes mellitus    insulin-dependent   Dyslipidemia    HTN (hypertension)    Post MI syndrome ([place]) 08-29-2009   Stroke ([place]) 10/22/2012   Left inferior pons     No Known Allergies   Current Outpatient Medications  Medication Sig Dispense Refill   amLODipine (NORVASC) 10 MG tablet Take 10 mg by mouth daily. for high blood pressure     atorvastatin (LIPITOR) 80 MG tablet Take 1 tablet (80 mg total) by mouth every evening. 90 tablet 3   carvedilol (COREG) 25 MG tablet Take 1 tablet (25 mg total) by mouth 2 (two) times daily. 180 tablet 1   clopidogrel (PLAVIX) 75 MG tablet Take 1 tablet (75 mg total) by mouth daily. 30 tablet 5   Continuous Blood Gluc Sensor (FREESTYLE LIBRE 14 DAY SENSOR) MISC USE AND DISCARD 1 SENSOR EVERY 14 DAYS 2 each 0   fluticasone (FLONASE) 50 MCG/ACT nasal spray Place 2 sprays into both nostrils daily.     furosemide (LASIX) 20 MG tablet Take 20 mg by mouth 2 (two) times daily.  (Patient not taking: Reported on 11/10/2020)     glipiZIDE (GLUCOTROL XL) 5 MG 24 hr tablet TAKE 1 TABLET BY MOUTH EVERY DAY WITH BREAKFAST 90 tablet 1   insulin regular human CONCENTRATED (HUMULIN R U-500 KWIKPEN) 500 UNIT/ML KwikPen Inject 80 Units into the skin 3 (three) times daily with meals. 30 mL 1   isosorbide mononitrate (IMDUR) 30 MG 24 hr tablet TAKE 1 TABLET BY MOUTH EVERY DAY 90 tablet 1   levothyroxine (SYNTHROID) 75 MCG tablet TAKE 1 TABLET BY MOUTH EVERY DAY BEFORE BREAKFAST 90 tablet 0   losartan (COZAAR) 25 MG tablet Take 25 mg by mouth daily.     metFORMIN (GLUCOPHAGE) 1000 MG tablet Take 500 mg by mouth daily with breakfast.  3   nitroGLYCERIN (NITROSTAT) 0.4 MG SL tablet Place 1 tablet (0.4 mg total) under the tongue every 5 (five) minutes as needed. 25 tablet 3   ONE TOUCH ULTRA TEST test strip USE TO TEST BLOOD SUGAR FOUR TIMES DAILY E11.65 150 each 5   No current facility-administered medications for this visit.     Past Surgical History:  Procedure Laterality Date   CORONARY ANGIOPLASTY WITH STENT PLACEMENT  No Known Allergies    Family History  Problem Relation Age of Onset   Hypertension Other        in all family members     Social History Mr. Menefee reports that he quit smoking about 32 years ago. His smoking use included cigarettes. He started smoking about 65 years ago. He has a 11.50 pack-year smoking history. He has never used smokeless tobacco. Mr. Obenshain reports no history of alcohol use.   Review of Systems CONSTITUTIONAL: No weight loss, fever, chills, weakness or fatigue.  HEENT: Eyes: No visual loss, blurred vision, double vision or yellow sclerae.No hearing loss, sneezing, congestion, runny nose or sore throat.  SKIN: No rash or itching.  CARDIOVASCULAR: per hpi RESPIRATORY: No shortness of breath, cough or sputum.  GASTROINTESTINAL: No anorexia, nausea, vomiting or diarrhea. No abdominal pain or blood.  GENITOURINARY: No burning on  urination, no polyuria NEUROLOGICAL: No headache, dizziness, syncope, paralysis, ataxia, numbness or tingling in the extremities. No change in bowel or bladder control.  MUSCULOSKELETAL: No muscle, back pain, joint pain or stiffness.  LYMPHATICS: No enlarged nodes. No history of splenectomy.  PSYCHIATRIC: No history of depression or anxiety.  ENDOCRINOLOGIC: No reports of sweating, cold or heat intolerance. No polyuria or polydipsia.  Marland Kitchen   Physical Examination Today's Vitals   11/25/20 1035  BP: 117/71  Pulse: 81  SpO2: 98%  Weight: 285 lb 3.2 oz (129.4 kg)  Height: '6\' 3"'$  (1.905 m)   Body mass index is 35.65 kg/m.  Gen: resting comfortably, no acute distress HEENT: no scleral icterus, pupils equal round and reactive, no palptable cervical adenopathy,  CV: RRR,  no mr/g, no jvd Resp: Clear to auscultation bilaterally GI: abdomen is soft, non-tender, non-distended, normal bowel sounds, no hepatosplenomegaly MSK: extremities are warm, no edema.  Skin: warm, no rash Neuro:  no focal deficits Psych: appropriate affect   Diagnostic Studies  Nuclear stress test 02/15/2018:   No diagnostic ST segment changes to indicate ischemia. Small, mild intensity, apical to basal inferior/inferolateral defect that is partially reversible. This is consistent with a mild ischemic territory versus variable diaphragmatic attenuation. This is an intermediate risk study. Nuclear stress EF: 44%. Visually, LVEF appears preserved, suggest echocardiogram to confirm.   Echocardiogram 02/20/2018 (limited):   Left ventricle: The cavity size was normal. There was moderate   concentric hypertrophy. Systolic function was normal. The   estimated ejection fraction was in the range of 60% to 65%. LVEF   64.8% by GLS. Wall motion was normal; there were no regional wall   motion abnormalities. Doppler parameters are consistent with   abnormal left ventricular relaxation (grade 1 diastolic   dysfunction).  Doppler parameters are consistent with high   ventricular filling pressure. - Aortic valve: Trileaflet; mildly thickened leaflets. - Mitral valve: Mild focal calcification of tip of anterior   leaflet. Mildly calcified annulus.         Cardiac catheterization 09/03/2011:   Hemodynamics:    RA: 13/11/9 RV: 34/7/9 PCWP: 11/9/8 PA:  28/13/19   Cardiac Output              Thermodilution: 6.0 with index of 2.5             Fick : 5.6 with index of 2.3   Arterial Sat: 94 PA Sat: 65.   LV pressure: 158/19/17 Aortic pressure: 153/90/115   Angiography    Left Main: The left main has minor luminal regularities but no significant stenosis.   Left anterior  Descending: The LAD has diffuse mild right allergies. There is a stent in the mid LAD which is widely patent. The distal LAD has minor luminal irregularities. There is a 40-50% stenosis in the very distal aspect of the LAD.  The first diagonal Ashaki Frosch is a fairly large Hideko Esselman. There are minor little regular disease but no significant stenosis. The second diagonal artery is a moderate-sized vessel. There is a 50% focal stenosis.   Left Circumflex: The left circumflex artery is a moderate-sized vessel. There are minor luminal irregularities   Right Coronary Artery: The right coronary artery is large and dominant. There are diffuse irregularities throughout the RCA. There is a 40% stenosis in the distal RCA. This is followed by  a focal 50% stenosis just prior to the bifurcation of 2 posterior lateral branches. This lesion does not appear to obstruct flow.   LV Gram: The left ventricular function is normal. Ejection fraction is roughly 65%.   Complications: No apparent complications Patient did tolerate procedure well.   Conclusions:   1. Mild to moderate coronary artery disease. The stent in the LAD is widely patent. There are no significant irregularities to explain his symptoms. 2. Normal left systolic function. Essentially normal  right heart pressures. 3. Moderate to severe hypertension. I suspect that this is the cause of the shortness of breath. He will need better blood pressure control 4. Poorly controlled diabetes. His glucose here in the lab was 329. He's on oral hypoglycemics. He will likely need to be started on insulin. We will defer to his medical Dr.    10/2020 echo Summary    1. The left ventricle is normal in size with upper normal wall thickness.    2. The left ventricular systolic function is normal, LVEF is visually  estimated at > 55%.    3. The left atrium is mildly dilated in size.    4. The right ventricle is normal in size, with normal systolic function.    5. Mild aortic valve sclerosis without stenosis.    Assessment and Plan  1. CAD - no recent chest pain, continue current meds - chronic SOB fairly benign recent cardiac testing, we discussed PFTs again today, he favors talking with his pcp about testing.  EKG shows SR, no ischemic changes   2. HTN - he is at goal, continue current meds   3. SOB - recent cardaic testing as reported above fairly benign - he will discuss possible PFTs with his pcp.    4. AAA screen - male over 53 with tobacco history, will reorder AAA [place] that was not completed after last visit      Arnoldo Lenis, M.D.

## 2020-11-25 NOTE — Addendum Note (Signed)
Addended by: Laurine Blazer on: 11/25/2020 01:20 PM   Modules accepted: Orders

## 2020-11-25 NOTE — Patient Instructions (Addendum)
Medication Instructions:  Continue all current medications.  Labwork: none  Testing/Procedures: Your physician has requested that you have an abdominal aorta duplex. During this test, an ultrasound is used to evaluate the aorta. Allow 30 minutes for this exam. Do not eat after midnight the day before and avoid carbonated beverages. Office will contact with results via phone or letter.     Follow-Up: 6 months   Any Other Special Instructions Will Be Listed Below (If Applicable).   If you need a refill on your cardiac medications before your next appointment, please call your pharmacy.

## 2020-12-08 ENCOUNTER — Other Ambulatory Visit: Payer: Self-pay | Admitting: "Endocrinology

## 2020-12-12 DIAGNOSIS — E11649 Type 2 diabetes mellitus with hypoglycemia without coma: Secondary | ICD-10-CM | POA: Diagnosis not present

## 2020-12-12 DIAGNOSIS — E7849 Other hyperlipidemia: Secondary | ICD-10-CM | POA: Diagnosis not present

## 2020-12-12 DIAGNOSIS — I110 Hypertensive heart disease with heart failure: Secondary | ICD-10-CM | POA: Diagnosis not present

## 2020-12-12 DIAGNOSIS — I5032 Chronic diastolic (congestive) heart failure: Secondary | ICD-10-CM | POA: Diagnosis not present

## 2020-12-22 ENCOUNTER — Other Ambulatory Visit: Payer: Self-pay | Admitting: *Deleted

## 2020-12-22 MED ORDER — CARVEDILOL 25 MG PO TABS: 25.0000 mg | ORAL_TABLET | Freq: Two times a day (BID) | ORAL | 1 refills | Status: DC

## 2020-12-31 DIAGNOSIS — E7849 Other hyperlipidemia: Secondary | ICD-10-CM | POA: Diagnosis not present

## 2020-12-31 DIAGNOSIS — E1165 Type 2 diabetes mellitus with hyperglycemia: Secondary | ICD-10-CM | POA: Diagnosis not present

## 2020-12-31 DIAGNOSIS — E782 Mixed hyperlipidemia: Secondary | ICD-10-CM | POA: Diagnosis not present

## 2020-12-31 DIAGNOSIS — N183 Chronic kidney disease, stage 3 unspecified: Secondary | ICD-10-CM | POA: Diagnosis not present

## 2020-12-31 DIAGNOSIS — R5383 Other fatigue: Secondary | ICD-10-CM | POA: Diagnosis not present

## 2020-12-31 DIAGNOSIS — I5032 Chronic diastolic (congestive) heart failure: Secondary | ICD-10-CM | POA: Diagnosis not present

## 2020-12-31 DIAGNOSIS — I10 Essential (primary) hypertension: Secondary | ICD-10-CM | POA: Diagnosis not present

## 2021-01-01 DIAGNOSIS — Z23 Encounter for immunization: Secondary | ICD-10-CM | POA: Diagnosis not present

## 2021-01-08 DIAGNOSIS — I10 Essential (primary) hypertension: Secondary | ICD-10-CM | POA: Diagnosis not present

## 2021-01-08 DIAGNOSIS — Z6838 Body mass index (BMI) 38.0-38.9, adult: Secondary | ICD-10-CM | POA: Diagnosis not present

## 2021-01-08 DIAGNOSIS — Z23 Encounter for immunization: Secondary | ICD-10-CM | POA: Diagnosis not present

## 2021-01-08 DIAGNOSIS — E1122 Type 2 diabetes mellitus with diabetic chronic kidney disease: Secondary | ICD-10-CM | POA: Diagnosis not present

## 2021-01-08 DIAGNOSIS — E7849 Other hyperlipidemia: Secondary | ICD-10-CM | POA: Diagnosis not present

## 2021-01-08 DIAGNOSIS — R0609 Other forms of dyspnea: Secondary | ICD-10-CM | POA: Diagnosis not present

## 2021-01-08 DIAGNOSIS — K760 Fatty (change of) liver, not elsewhere classified: Secondary | ICD-10-CM | POA: Diagnosis not present

## 2021-01-08 DIAGNOSIS — I5032 Chronic diastolic (congestive) heart failure: Secondary | ICD-10-CM | POA: Diagnosis not present

## 2021-01-13 ENCOUNTER — Other Ambulatory Visit (HOSPITAL_BASED_OUTPATIENT_CLINIC_OR_DEPARTMENT_OTHER): Payer: Self-pay

## 2021-01-13 DIAGNOSIS — R0683 Snoring: Secondary | ICD-10-CM

## 2021-01-16 DIAGNOSIS — J324 Chronic pansinusitis: Secondary | ICD-10-CM | POA: Diagnosis not present

## 2021-01-16 DIAGNOSIS — J3489 Other specified disorders of nose and nasal sinuses: Secondary | ICD-10-CM | POA: Diagnosis not present

## 2021-01-16 DIAGNOSIS — J342 Deviated nasal septum: Secondary | ICD-10-CM | POA: Diagnosis not present

## 2021-01-18 ENCOUNTER — Encounter: Payer: Medicare Other | Admitting: Neurology

## 2021-01-19 ENCOUNTER — Ambulatory Visit: Payer: Medicare Other | Attending: Family Medicine | Admitting: Neurology

## 2021-01-19 ENCOUNTER — Other Ambulatory Visit: Payer: Self-pay

## 2021-01-19 DIAGNOSIS — G4733 Obstructive sleep apnea (adult) (pediatric): Secondary | ICD-10-CM | POA: Insufficient documentation

## 2021-01-19 DIAGNOSIS — R0683 Snoring: Secondary | ICD-10-CM | POA: Insufficient documentation

## 2021-01-26 DIAGNOSIS — G4733 Obstructive sleep apnea (adult) (pediatric): Secondary | ICD-10-CM | POA: Diagnosis not present

## 2021-02-12 NOTE — Procedures (Signed)
[place] A. Merlene Laughter, MD     [email or web address]             HOME SLEEP STUDY  LOCATION: [facility]  Patient Name: Alex Cortez, Fread Date: 01/19/2021 Gender: Male D.O.B: [birth year] Age (years): 30 Referring Provider: Judd Lien MD Height (inches): 75 Interpreting Physician: Phillips Odor MD, ABSM Weight (lbs): 285 RPSGT: Rosebud Poles BMI: 36 MRN: [id number] Neck Size: CLINICAL INFORMATION Sleep Study Type: HST     Indication for sleep study: N/A     Epworth Sleepiness Score: N/A  SLEEP STUDY TECHNIQUE A multi-channel overnight portable sleep study was performed. The channels recorded were: nasal airflow, thoracic respiratory movement, and oxygen saturation with a pulse oximetry. Snoring was also monitored.  MEDICATIONS Patient self administered medications include: N/A.  Current Outpatient Medications:    amLODipine (NORVASC) 10 MG tablet, Take 10 mg by mouth daily. for high blood pressure, Disp: , Rfl:    atorvastatin (LIPITOR) 80 MG tablet, Take 1 tablet (80 mg total) by mouth every evening., Disp: 90 tablet, Rfl: 3   carvedilol (COREG) 25 MG tablet, Take 1 tablet (25 mg total) by mouth 2 (two) times daily., Disp: 180 tablet, Rfl: 1   clopidogrel (PLAVIX) 75 MG tablet, Take 1 tablet (75 mg total) by mouth daily., Disp: 30 tablet, Rfl: 5   Continuous Blood Gluc Sensor (FREESTYLE LIBRE 14 DAY SENSOR) MISC, USE AND DISCARD 1 SENSOR   EVERY 14 DAYS, Disp: 6 each, Rfl: 2   fluticasone (FLONASE) 50 MCG/ACT nasal spray, Place 2 sprays into both nostrils daily., Disp: , Rfl:    glipiZIDE (GLUCOTROL XL) 5 MG 24 hr tablet, TAKE 1 TABLET BY MOUTH EVERY DAY WITH BREAKFAST, Disp: 90 tablet, Rfl: 1   insulin regular human CONCENTRATED (HUMULIN R U-500 KWIKPEN) 500 UNIT/ML KwikPen, Inject as directed, 80 units with breakfast, 80 units with lunch, and 80 units with supper for pre-meal blood glucose readings of 90 mg/dL or above., Disp: 30 mL, Rfl: 1    isosorbide mononitrate (IMDUR) 30 MG 24 hr tablet, TAKE 1 TABLET BY MOUTH EVERY DAY, Disp: 90 tablet, Rfl: 1   levothyroxine (SYNTHROID) 75 MCG tablet, TAKE 1 TABLET BY MOUTH EVERY DAY BEFORE BREAKFAST, Disp: 90 tablet, Rfl: 0   losartan (COZAAR) 25 MG tablet, Take 25 mg by mouth daily., Disp: , Rfl:    metFORMIN (GLUCOPHAGE) 1000 MG tablet, Take 500 mg by mouth daily with breakfast., Disp: , Rfl: 3   nitroGLYCERIN (NITROSTAT) 0.4 MG SL tablet, Place 1 tablet (0.4 mg total) under the tongue every 5 (five) minutes as needed., Disp: 25 tablet, Rfl: 3   ONE TOUCH ULTRA TEST test strip, USE TO TEST BLOOD SUGAR FOUR TIMES DAILY E11.65, Disp: 150 each, Rfl: 5   SLEEP ARCHITECTURE Patient was studied for 440.5 minutes. The sleep efficiency was 91.8 % and the patient was supine for 38.8%. The arousal index was 0.0 per hour.  RESPIRATORY PARAMETERS The overall AHI was 64.2 per hour, with a central apnea index of 0 per hour.  The oxygen nadir was 65% during sleep.     CARDIAC DATA Mean heart rate during sleep was 70.7 bpm.  IMPRESSIONS Severe obstructive sleep apnea occurred during this study (AHI = 64.2/h).  AutoPAP 8-14 is recommended.   Delano Metz, MD Diplomate, American Board of Sleep Medicine.        ELECTRONICALLY SIGNED ON:  02/12/2021, 6:10 PM [place] PH: [phone]   FX: [phone] ACCREDITED BY  THE AMERICAN ACADEMY OF SLEEP MEDICINE

## 2021-02-16 ENCOUNTER — Ambulatory Visit (INDEPENDENT_AMBULATORY_CARE_PROVIDER_SITE_OTHER): Payer: Medicare Other | Admitting: "Endocrinology

## 2021-02-16 ENCOUNTER — Encounter: Payer: Self-pay | Admitting: "Endocrinology

## 2021-02-16 ENCOUNTER — Other Ambulatory Visit: Payer: Self-pay

## 2021-02-16 VITALS — BP 138/74 | HR 80 | Ht 75.0 in | Wt 288.4 lb

## 2021-02-16 DIAGNOSIS — E1159 Type 2 diabetes mellitus with other circulatory complications: Secondary | ICD-10-CM

## 2021-02-16 DIAGNOSIS — I2510 Atherosclerotic heart disease of native coronary artery without angina pectoris: Secondary | ICD-10-CM

## 2021-02-16 LAB — POCT GLYCOSYLATED HEMOGLOBIN (HGB A1C): HbA1c, POC (controlled diabetic range): 9.1 % — AB (ref 0.0–7.0)

## 2021-02-16 MED ORDER — HUMULIN R U-500 KWIKPEN 500 UNIT/ML ~~LOC~~ SOPN: PEN_INJECTOR | SUBCUTANEOUS | 1 refills | Status: DC

## 2021-02-16 NOTE — Progress Notes (Signed)
02/16/2021  Endocrinology follow-up note   Subjective:    Patient ID: Alex Cortez, male    DOB: 12-16-1950, PCP Pleas Koch Virgina Evener, MD   Past Medical History:  Diagnosis Date   CAD (coronary artery disease)    nonobstructive CAD/patent LAD stent site; EF 65%, 08/2011; NSTEMI/DES LAD, 6/20 11   Chest pain    Diabetes mellitus    insulin-dependent   Dyslipidemia    HTN (hypertension)    Post MI syndrome ([place]) 08-29-2009   Stroke ([place]) 10/22/2012   Left inferior pons   Past Surgical History:  Procedure Laterality Date   CORONARY ANGIOPLASTY WITH STENT PLACEMENT     Social History   Socioeconomic History   Marital status: Married    Spouse name: Not on file   Number of children: Not on file   Years of education: Not on file   Highest education level: Not on file  Occupational History   Not on file  Tobacco Use   Smoking status: Former    Packs/day: 0.50    Years: 23.00    Pack years: 11.50    Types: Cigarettes    Start date: 03/16/1955    Quit date: 03/15/1988    Years since quitting: 32.9   Smokeless tobacco: Never  Vaping Use   Vaping Use: Never used  Substance and Sexual Activity   Alcohol use: No    Alcohol/week: 0.0 standard drinks   Drug use: No   Sexual activity: Not on file  Other Topics Concern   Not on file  Social History Narrative   Works at [employer]. Married.    Social Determinants of Health   Financial Resource Strain: Not on file  Food Insecurity: Not on file  Transportation Needs: Not on file  Physical Activity: Not on file  Stress: Not on file  Social Connections: Not on file   Outpatient Encounter Medications as of 02/16/2021  Medication Sig   amLODipine (NORVASC) 10 MG tablet Take 10 mg by mouth daily. for high blood pressure   atorvastatin (LIPITOR) 80 MG tablet Take 1 tablet (80 mg total) by mouth every evening.   carvedilol (COREG) 25 MG tablet Take 1 tablet (25 mg total) by mouth 2 (two) times daily.   clopidogrel (PLAVIX) 75  MG tablet Take 1 tablet (75 mg total) by mouth daily.   Continuous Blood Gluc Sensor (FREESTYLE LIBRE 14 DAY SENSOR) MISC USE AND DISCARD 1 SENSOR   EVERY 14 DAYS   fluticasone (FLONASE) 50 MCG/ACT nasal spray Place 2 sprays into both nostrils daily.   glipiZIDE (GLUCOTROL XL) 5 MG 24 hr tablet TAKE 1 TABLET BY MOUTH EVERY DAY WITH BREAKFAST   insulin regular human CONCENTRATED (HUMULIN R U-500 KWIKPEN) 500 UNIT/ML KwikPen Inject as directed 70 units with breakfast, 70 units with lunch, and 70 units with supper for pre-meal blood glucose readings of 90 mg/dL or above.   isosorbide mononitrate (IMDUR) 30 MG 24 hr tablet TAKE 1 TABLET BY MOUTH EVERY DAY   levothyroxine (SYNTHROID) 75 MCG tablet TAKE 1 TABLET BY MOUTH EVERY DAY BEFORE BREAKFAST   losartan (COZAAR) 25 MG tablet Take 25 mg by mouth daily.   metFORMIN (GLUCOPHAGE) 1000 MG tablet Take 1,000 mg by mouth daily with breakfast.   nitroGLYCERIN (NITROSTAT) 0.4 MG SL tablet Place 1 tablet (0.4 mg total) under the tongue every 5 (five) minutes as needed.   ONE TOUCH ULTRA TEST test strip USE TO TEST BLOOD SUGAR FOUR TIMES DAILY E11.65   [  DISCONTINUED] insulin regular human CONCENTRATED (HUMULIN R U-500 KWIKPEN) 500 UNIT/ML KwikPen Inject as directed, 80 units with breakfast, 80 units with lunch, and 80 units with supper for pre-meal blood glucose readings of 90 mg/dL or above.   No facility-administered encounter medications on file as of 02/16/2021.   ALLERGIES: Allergies  Allergen Reactions   Ace Inhibitors Swelling    Angioedema   Other Swelling    Shellfish  Swelling and hives   VACCINATION STATUS: There is no immunization history for the selected administration types on file for this patient.   Diabetes He presents for his follow-up diabetic visit. He has type 2 diabetes mellitus. Onset time: He was diagnosed at approximate age of 74 years. His disease course has been improving. There are no hypoglycemic associated symptoms.  Pertinent negatives for hypoglycemia include no confusion, headaches, pallor or seizures. Pertinent negatives for diabetes include no chest pain, no fatigue, no polydipsia, no polyphagia, no polyuria and no weakness. There are no hypoglycemic complications. Symptoms are improving. Diabetic complications include a CVA, heart disease and nephropathy. Risk factors for coronary artery disease include diabetes mellitus, dyslipidemia, hypertension, male sex, obesity, sedentary lifestyle and tobacco exposure. Current diabetic treatment includes intensive insulin program. He is compliant with treatment most of the time. His weight is increasing steadily. He is following a generally unhealthy (He consumes a large quantity of soda.) diet. When asked about meal planning, he reported none. He has had a previous visit with a dietitian. He never participates in exercise. His home blood glucose trend is decreasing steadily. His breakfast blood glucose range is generally 140-180 mg/dl. His lunch blood glucose range is generally 140-180 mg/dl. His dinner blood glucose range is generally 140-180 mg/dl. His bedtime blood glucose range is generally 140-180 mg/dl. His overall blood glucose range is 140-180 mg/dl. (He presents with his CGM device of Libre.  He is AGP shows 61% time range, 29% slightly above range.  His average blood glucose is 149 mg per DL.  However he continues to have discrepant point-of-care A1c of 9.1% today.  His estimated A1c based on his CGM is 6.9%.  He does have 4% rate of hypoglycemia.    ) An ACE inhibitor/angiotensin II receptor blocker is being taken. Eye exam is current.  Hyperlipidemia This is a chronic problem. The current episode started more than 1 year ago. The problem is controlled. Exacerbating diseases include chronic renal disease, diabetes and obesity. Pertinent negatives include no chest pain, leg pain, myalgias or shortness of breath. Current antihyperlipidemic treatment includes statins.  Risk factors for coronary artery disease include diabetes mellitus, dyslipidemia, hypertension, male sex, obesity and a sedentary lifestyle.  Hypertension This is a chronic problem. The current episode started more than 1 year ago. The problem is controlled. Pertinent negatives include no chest pain, headaches, neck pain, palpitations or shortness of breath. Risk factors for coronary artery disease include obesity, male gender, dyslipidemia, diabetes mellitus, sedentary lifestyle and family history. Past treatments include ACE inhibitors. Hypertensive end-organ damage includes kidney disease and CVA. Identifiable causes of hypertension include chronic renal disease.    Review of systems  Constitutional: + Gaining weight,  current  Body mass index is 36.05 kg/m. , no fatigue, no subjective hyperthermia, no subjective hypothermia    Objective:    BP 138/74   Pulse 80   Ht 6\' 3"  (1.905 m)   Wt 288 lb 6.4 oz (130.8 kg)   BMI 36.05 kg/m   Wt Readings from Last 3 Encounters:  02/16/21  288 lb 6.4 oz (130.8 kg)  11/25/20 285 lb 3.2 oz (129.4 kg)  11/10/20 284 lb 3.2 oz (128.9 kg)      Physical Exam- Limited  Constitutional:  Body mass index is 36.05 kg/m. , not in acute distress, normal state of mind Eyes:  EOMI, no exophthalmos Neck: Supple  Respiratory: Adequate breathing efforts Musculoskeletal: no gross deformities, strength intact in all four extremities, no gross restriction of joint movements Skin:  no rashes, no hyperemia, + lipodystrophy on abdominal skin Neurological: no tremor with outstretched hands    CMP Latest Ref Rng & Units 01/01/2020 01/19/2019 09/25/2018  Glucose 65 - 99 mg/dL 253(H) - 227(H)  BUN 7 - 25 mg/dL 13 12 16   Creatinine 0.70 - 1.18 mg/dL 1.15 1.0 1.05  Sodium 135 - 146 mmol/L 138 - 139  Potassium 3.5 - 5.3 mmol/L 4.3 - 4.6  Chloride 98 - 110 mmol/L 103 - 104  CO2 20 - 32 mmol/L 28 - 29  Calcium 8.6 - 10.3 mg/dL 8.9 - 9.1  Total Protein 6.1 - 8.1  g/dL 7.5 - 7.5  Total Bilirubin 0.2 - 1.2 mg/dL 0.7 - 0.7  Alkaline Phos 38 - 126 U/L - - -  AST 10 - 35 U/L 20 - 17  ALT 9 - 46 U/L 29 - 30    Recent Results (from the past 2160 hour(s))  HgB A1c     Status: Abnormal   Collection Time: 02/16/21  8:39 AM  Result Value Ref Range   Hemoglobin A1C     HbA1c POC (<> result, manual entry)     HbA1c, POC (prediabetic range)     HbA1c, POC (controlled diabetic range) 9.1 (A) 0.0 - 7.0 %   Lipid Panel     Component Value Date/Time   CHOL 128 01/19/2019 0000   TRIG 98 01/19/2019 0000   HDL 36 01/19/2019 0000   CHOLHDL 4.5 08/01/2017 0243   VLDL 20 08/01/2017 0243   LDLCALC 73 01/19/2019 0000     Assessment & Plan:   1. Type 2 diabetes mellitus with vascular disease (HCC)  -His  diabetes is complicated by recurrent CVA , coronary artery disease, obesity/sedentary life and patient remains at extremely  high risk for more acute and chronic complications of diabetes which include CAD, CVA, CKD, retinopathy, and neuropathy. These are all discussed in detail with the patient.  He presents with his CGM device of Libre.  He is AGP shows 61% time range, 29% slightly above range.  His average blood glucose is 149 mg per DL.  However he continues to have discrepant point-of-care A1c of 9.1% today.  His estimated A1c based on his CGM is 6.9%.  He does have 4% rate of hypoglycemia.     - Glucose logs and insulin administration records pertaining to this visit,  to be scanned into patient's records.  Recent labs reviewed.  - I have re-counseled the patient on diet management and weight loss  by adopting a carbohydrate restricted / protein rich  Diet.  - he acknowledges that there is a room for improvement in his food and drink choices. - Suggestion is made for him to avoid simple carbohydrates  from his diet including Cakes, Sweet Desserts, Ice Cream, Soda (diet and regular), Sweet Tea, Candies, Chips, Cookies, Store Bought Juices, Alcohol in  Excess of  1-2 drinks a day, Artificial Sweeteners,  Coffee Creamer, and "Sugar-free" Products, Lemonade. This will help patient to have more stable blood glucose profile and potentially  avoid unintended weight gain.   - Patient is advised to stick to a routine mealtimes to eat 3 meals  a day and avoid unnecessary snacks ( to snack only to correct hypoglycemia).  - I have approached patient with the following individualized plan to manage diabetes and patient agrees.  -He continues to respond to U500 with better average blood glucose results.   He continues to have discrepancy between his average blood glucose and A1c. -Priority will be to decrease his frequency of hypoglycemia.  He is advised to lower his Humulin RU500 to  70 units with breakfast, 70 units with lunch, and 70 units with supper   for pre-meal blood glucose readings of 90 mg/dL or above.   -I have advised him on how he can rotate insulin injection sites on his abdominal skin. -He is warned not not to take insulin without proper monitoring of blood glucose.  He is advised to  call clinic if he registers blood glucose less than 70 mg/dL or greater than 200 mg/dL. -He is benefiting from low-dose metformin and glipizide.  He is advised to continue metformin 500 mg p.o. daily at breakfast as well as glipizide 5 mg XL p.o. daily at breakfast.   -Whole Foods, plan based lifestyle nutrition is discussed and recommended for him.  His diet plan will help with not only diabetes, but also hyperlipidemia, hypertension, weight management.   - Patient specific target  for A1c; LDL, HDL, Triglycerides, and  Waist Circumference were discussed in detail.  2) BP/HTN: -His blood pressure is controlled to target.  It appears that he had a reaction to benazepril with angioedema.  This medication was stopped and he is currently on losartan 25 mg p.o. daily at breakfast.     3) Lipids/HPL: His lipid panel shows controlled LDL 73.   He is advised to  continue atorvastatin 80 mg p.o. nightly.    Side effects and precautions discussed with him.   4)  Weight/Diet: His BMI is  62.22--[id number] complicating his diabetes care.  Marland Kitchen  Has no success for weight control mainly due to his dietary indiscretion.  He has consulted with CDE.  He is advised on weight loss surgery.  See above.  5) hypothyroidism- His recent  thyroid function tests are consistent with appropriate replacement.  He is advised to continue levothyroxine 75 mcg p.o. daily before breakfast.   - We discussed about the correct intake of his thyroid hormone, on empty stomach at fasting, with water, separated by at least 30 minutes from breakfast and other medications,  and separated by more than 4 hours from calcium, iron, multivitamins, acid reflux medications (PPIs). -Patient is made aware of the fact that thyroid hormone replacement is needed for life, dose to be adjusted by periodic monitoring of thyroid function tests.   6) Chronic Care/Health Maintenance:  -Patient  is  on ACEI/ARB and Statin medications and encouraged to continue to follow up with Ophthalmology, urologist given his recent recurrent CVA, podiatrist at least yearly or according to recommendations, and advised to  stay away from smoking. I have recommended yearly flu vaccine and pneumonia vaccination at least every 5 years; moderate intensity exercise for up to 150 minutes weekly; and  sleep for at least 7 hours a day.  POCT ABI was normal on April 09, 2020, next study will be repeated in January 2027, or sooner if needed.    - I advised patient to maintain close follow up with [place], Virgina Evener, MD for primary  care needs.    I spent 42 minutes in the care of the patient today including review of labs from [facility], Lipids, Thyroid Function, Hematology (current and previous including abstractions from other facilities); face-to-face time discussing  his blood glucose readings/logs, discussing hypoglycemia and  hyperglycemia episodes and symptoms, medications doses, his options of short and long term treatment based on the latest standards of care / guidelines;  discussion about incorporating lifestyle medicine;  and documenting the encounter.    Please refer to Patient Instructions for Blood Glucose Monitoring and Insulin/Medications Dosing Guide"  in media tab for additional information. Please  also refer to " Patient Self Inventory" in the Media  tab for reviewed elements of pertinent patient history.  [facility] Staff Chandran participated in the discussions, expressed understanding, and voiced agreement with the above plans.  All questions were answered to his satisfaction. he is encouraged to contact clinic should he have any questions or concerns prior to his return visit.    Follow up plan: -Return in about 3 months (around 05/17/2021) for Bring Meter and Logs- A1c in Office.  Glade Lloyd, MD Phone: [phone]  Fax: [phone]  -  This note was partially dictated with voice recognition software. Similar sounding words can be transcribed inadequately or may not  be corrected upon review.  02/16/2021, 10:03 AM

## 2021-02-16 NOTE — Patient Instructions (Signed)

## 2021-02-24 DIAGNOSIS — J342 Deviated nasal septum: Secondary | ICD-10-CM | POA: Diagnosis not present

## 2021-02-24 DIAGNOSIS — J343 Hypertrophy of nasal turbinates: Secondary | ICD-10-CM | POA: Diagnosis not present

## 2021-02-24 DIAGNOSIS — J324 Chronic pansinusitis: Secondary | ICD-10-CM | POA: Diagnosis not present

## 2021-03-12 ENCOUNTER — Other Ambulatory Visit: Payer: Self-pay

## 2021-03-12 ENCOUNTER — Ambulatory Visit (INDEPENDENT_AMBULATORY_CARE_PROVIDER_SITE_OTHER): Payer: Medicare Other

## 2021-03-12 DIAGNOSIS — Z87891 Personal history of nicotine dependence: Secondary | ICD-10-CM | POA: Diagnosis not present

## 2021-03-12 DIAGNOSIS — Z136 Encounter for screening for cardiovascular disorders: Secondary | ICD-10-CM

## 2021-03-25 DIAGNOSIS — J324 Chronic pansinusitis: Secondary | ICD-10-CM | POA: Diagnosis not present

## 2021-03-25 DIAGNOSIS — J342 Deviated nasal septum: Secondary | ICD-10-CM | POA: Diagnosis not present

## 2021-03-25 DIAGNOSIS — J343 Hypertrophy of nasal turbinates: Secondary | ICD-10-CM | POA: Diagnosis not present

## 2021-04-01 ENCOUNTER — Telehealth: Payer: Self-pay | Admitting: *Deleted

## 2021-04-01 ENCOUNTER — Encounter: Payer: Self-pay | Admitting: *Deleted

## 2021-04-01 NOTE — Telephone Encounter (Signed)
Laurine Blazer, LPN  5/48/6282  4:17 AM EST Back to Top    Patient notified via letter.  Copy to pcp

## 2021-04-01 NOTE — Telephone Encounter (Signed)
-----   Message from Arnoldo Lenis, MD sent at 03/25/2021  3:53 PM EST ----- Normal [place], no evidence of aortic Jacqualyn Posey MD

## 2021-04-12 DIAGNOSIS — E11649 Type 2 diabetes mellitus with hypoglycemia without coma: Secondary | ICD-10-CM | POA: Diagnosis not present

## 2021-04-12 DIAGNOSIS — I110 Hypertensive heart disease with heart failure: Secondary | ICD-10-CM | POA: Diagnosis not present

## 2021-04-12 DIAGNOSIS — I5032 Chronic diastolic (congestive) heart failure: Secondary | ICD-10-CM | POA: Diagnosis not present

## 2021-04-13 DIAGNOSIS — E039 Hypothyroidism, unspecified: Secondary | ICD-10-CM | POA: Diagnosis not present

## 2021-04-13 DIAGNOSIS — E782 Mixed hyperlipidemia: Secondary | ICD-10-CM | POA: Diagnosis not present

## 2021-04-13 DIAGNOSIS — N183 Chronic kidney disease, stage 3 unspecified: Secondary | ICD-10-CM | POA: Diagnosis not present

## 2021-04-13 DIAGNOSIS — E7849 Other hyperlipidemia: Secondary | ICD-10-CM | POA: Diagnosis not present

## 2021-04-13 DIAGNOSIS — R5383 Other fatigue: Secondary | ICD-10-CM | POA: Diagnosis not present

## 2021-04-13 DIAGNOSIS — E1165 Type 2 diabetes mellitus with hyperglycemia: Secondary | ICD-10-CM | POA: Diagnosis not present

## 2021-04-13 DIAGNOSIS — I10 Essential (primary) hypertension: Secondary | ICD-10-CM | POA: Diagnosis not present

## 2021-04-20 DIAGNOSIS — I700 Atherosclerosis of aorta: Secondary | ICD-10-CM | POA: Diagnosis not present

## 2021-04-20 DIAGNOSIS — E1122 Type 2 diabetes mellitus with diabetic chronic kidney disease: Secondary | ICD-10-CM | POA: Diagnosis not present

## 2021-04-20 DIAGNOSIS — Z955 Presence of coronary angioplasty implant and graft: Secondary | ICD-10-CM | POA: Diagnosis not present

## 2021-04-20 DIAGNOSIS — K760 Fatty (change of) liver, not elsewhere classified: Secondary | ICD-10-CM | POA: Diagnosis not present

## 2021-04-20 DIAGNOSIS — I10 Essential (primary) hypertension: Secondary | ICD-10-CM | POA: Diagnosis not present

## 2021-04-20 DIAGNOSIS — J329 Chronic sinusitis, unspecified: Secondary | ICD-10-CM | POA: Diagnosis not present

## 2021-04-20 DIAGNOSIS — I5032 Chronic diastolic (congestive) heart failure: Secondary | ICD-10-CM | POA: Diagnosis not present

## 2021-04-20 DIAGNOSIS — D126 Benign neoplasm of colon, unspecified: Secondary | ICD-10-CM | POA: Diagnosis not present

## 2021-05-18 ENCOUNTER — Other Ambulatory Visit: Payer: Self-pay

## 2021-05-18 ENCOUNTER — Ambulatory Visit (INDEPENDENT_AMBULATORY_CARE_PROVIDER_SITE_OTHER): Payer: Medicare Other | Admitting: "Endocrinology

## 2021-05-18 ENCOUNTER — Encounter: Payer: Self-pay | Admitting: "Endocrinology

## 2021-05-18 VITALS — BP 126/64 | HR 80 | Ht 75.0 in | Wt 279.2 lb

## 2021-05-18 DIAGNOSIS — E1159 Type 2 diabetes mellitus with other circulatory complications: Secondary | ICD-10-CM

## 2021-05-18 DIAGNOSIS — E039 Hypothyroidism, unspecified: Secondary | ICD-10-CM | POA: Diagnosis not present

## 2021-05-18 DIAGNOSIS — E782 Mixed hyperlipidemia: Secondary | ICD-10-CM | POA: Diagnosis not present

## 2021-05-18 DIAGNOSIS — I10 Essential (primary) hypertension: Secondary | ICD-10-CM

## 2021-05-18 LAB — POCT GLYCOSYLATED HEMOGLOBIN (HGB A1C): HbA1c, POC (controlled diabetic range): 8.5 % — AB (ref 0.0–7.0)

## 2021-05-18 NOTE — Patient Instructions (Signed)

## 2021-05-18 NOTE — Progress Notes (Signed)
05/18/2021  Endocrinology follow-up note   Subjective:    Patient ID: Alex Cortez, male    DOB: 11/01/1950, PCP Pleas Koch Virgina Evener, MD   Past Medical History:  Diagnosis Date   CAD (coronary artery disease)    nonobstructive CAD/patent LAD stent site; EF 65%, 08/2011; NSTEMI/DES LAD, 6/20 11   Chest pain    Diabetes mellitus    insulin-dependent   Dyslipidemia    HTN (hypertension)    Post MI syndrome ([place]) 08-29-2009   Stroke ([place]) 10/22/2012   Left inferior pons   Past Surgical History:  Procedure Laterality Date   CORONARY ANGIOPLASTY WITH STENT PLACEMENT     Social History   Socioeconomic History   Marital status: Married    Spouse name: Not on file   Number of children: Not on file   Years of education: Not on file   Highest education level: Not on file  Occupational History   Not on file  Tobacco Use   Smoking status: Former    Packs/day: 0.50    Years: 23.00    Pack years: 11.50    Types: Cigarettes    Start date: 03/16/1955    Quit date: 03/15/1988    Years since quitting: 33.1   Smokeless tobacco: Never  Vaping Use   Vaping Use: Never used  Substance and Sexual Activity   Alcohol use: No    Alcohol/week: 0.0 standard drinks   Drug use: No   Sexual activity: Not on file  Other Topics Concern   Not on file  Social History Narrative   Works at [employer]. Married.    Social Determinants of Health   Financial Resource Strain: Not on file  Food Insecurity: Not on file  Transportation Needs: Not on file  Physical Activity: Not on file  Stress: Not on file  Social Connections: Not on file   Outpatient Encounter Medications as of 05/18/2021  Medication Sig   amLODipine (NORVASC) 10 MG tablet Take 10 mg by mouth daily. for high blood pressure   atorvastatin (LIPITOR) 80 MG tablet Take 1 tablet (80 mg total) by mouth every evening.   carvedilol (COREG) 25 MG tablet Take 1 tablet (25 mg total) by mouth 2 (two) times daily.   clopidogrel (PLAVIX) 75 MG  tablet Take 1 tablet (75 mg total) by mouth daily.   Continuous Blood Gluc Sensor (FREESTYLE LIBRE 14 DAY SENSOR) MISC USE AND DISCARD 1 SENSOR   EVERY 14 DAYS   fluticasone (FLONASE) 50 MCG/ACT nasal spray Place 2 sprays into both nostrils daily.   insulin regular human CONCENTRATED (HUMULIN R U-500 KWIKPEN) 500 UNIT/ML KwikPen Inject as directed 70 units with breakfast, 70 units with lunch, and 70 units with supper for pre-meal blood glucose readings of 90 mg/dL or above.   isosorbide mononitrate (IMDUR) 30 MG 24 hr tablet TAKE 1 TABLET BY MOUTH EVERY DAY   levothyroxine (SYNTHROID) 75 MCG tablet TAKE 1 TABLET BY MOUTH EVERY DAY BEFORE BREAKFAST   losartan (COZAAR) 25 MG tablet Take 25 mg by mouth daily.   metFORMIN (GLUCOPHAGE) 1000 MG tablet Take 1,000 mg by mouth daily with breakfast.   nitroGLYCERIN (NITROSTAT) 0.4 MG SL tablet Place 1 tablet (0.4 mg total) under the tongue every 5 (five) minutes as needed.   ONE TOUCH ULTRA TEST test strip USE TO TEST BLOOD SUGAR FOUR TIMES DAILY E11.65   [DISCONTINUED] glipiZIDE (GLUCOTROL XL) 5 MG 24 hr tablet TAKE 1 TABLET BY MOUTH EVERY DAY WITH BREAKFAST  No facility-administered encounter medications on file as of 05/18/2021.   ALLERGIES: Allergies  Allergen Reactions   Ace Inhibitors Swelling    Angioedema   Other Swelling    Shellfish  Swelling and hives   VACCINATION STATUS: Immunization History  Administered Date(s) Administered   Moderna Sars-Covid-2 Vaccination 04/19/2019, 05/17/2019     Diabetes He presents for his follow-up diabetic visit. He has type 2 diabetes mellitus. Onset time: He was diagnosed at approximate age of 64 years. His disease course has been improving. There are no hypoglycemic associated symptoms. Pertinent negatives for hypoglycemia include no confusion, headaches, pallor or seizures. Pertinent negatives for diabetes include no chest pain, no fatigue, no polydipsia, no polyphagia, no polyuria and no weakness.  There are no hypoglycemic complications. Symptoms are improving. Diabetic complications include a CVA, heart disease and nephropathy. Risk factors for coronary artery disease include diabetes mellitus, dyslipidemia, hypertension, male sex, obesity, sedentary lifestyle and tobacco exposure. Current diabetic treatment includes intensive insulin program. He is compliant with treatment most of the time. His weight is decreasing steadily. He is following a generally unhealthy (He consumes a large quantity of soda.) diet. When asked about meal planning, he reported none. He has had a previous visit with a dietitian. He never participates in exercise. His home blood glucose trend is decreasing steadily. His breakfast blood glucose range is generally 140-180 mg/dl. His lunch blood glucose range is generally 140-180 mg/dl. His dinner blood glucose range is generally 140-180 mg/dl. His bedtime blood glucose range is generally 140-180 mg/dl. His overall blood glucose range is 140-180 mg/dl. (Mr. Imes presents with his CGM device.  His AGP shows 69% time range, 27% slightly above range.  No major sustained hypoglycemia.  His average blood glucose 152 mg per DL over the last 14 days.  His point-of-care A1c is 8.5%, improving overall.   However he continues to have discrepant point-of-care A1c Fawze average blood glucose keeps presenting with. ) An ACE inhibitor/angiotensin II receptor blocker is being taken. Eye exam is current.  Hyperlipidemia This is a chronic problem. The current episode started more than 1 year ago. The problem is controlled. Exacerbating diseases include chronic renal disease, diabetes and obesity. Pertinent negatives include no chest pain, leg pain, myalgias or shortness of breath. Current antihyperlipidemic treatment includes statins. Risk factors for coronary artery disease include diabetes mellitus, dyslipidemia, hypertension, male sex, obesity and a sedentary lifestyle.  Hypertension This is a  chronic problem. The current episode started more than 1 year ago. The problem is controlled. Pertinent negatives include no chest pain, headaches, neck pain, palpitations or shortness of breath. Risk factors for coronary artery disease include obesity, male gender, dyslipidemia, diabetes mellitus, sedentary lifestyle and family history. Past treatments include ACE inhibitors. Hypertensive end-organ damage includes kidney disease and CVA. Identifiable causes of hypertension include chronic renal disease.    Review of systems  Constitutional: + Gaining weight,  current  Body mass index is 34.9 kg/m. , no fatigue, no subjective hyperthermia, no subjective hypothermia    Objective:    BP 126/64    Pulse 80    Ht '6\' 3"'$  (1.905 m)    Wt 279 lb 3.2 oz (126.6 kg)    BMI 34.90 kg/m   Wt Readings from Last 3 Encounters:  05/18/21 279 lb 3.2 oz (126.6 kg)  02/16/21 288 lb 6.4 oz (130.8 kg)  11/25/20 285 lb 3.2 oz (129.4 kg)      Physical Exam- Limited  Constitutional:  Body mass index is  34.9 kg/m. , not in acute distress, normal state of mind Eyes:  EOMI, no exophthalmos Neck: Supple  Respiratory: Adequate breathing efforts Musculoskeletal: no gross deformities, strength intact in all four extremities, no gross restriction of joint movements Skin:  no rashes, no hyperemia, + lipodystrophy on abdominal skin Neurological: no tremor with outstretched hands    CMP Latest Ref Rng & Units 01/01/2020 01/19/2019 09/25/2018  Glucose 65 - 99 mg/dL 253(H) - 227(H)  BUN 7 - 25 mg/dL '13 12 16  '$ Creatinine 0.70 - 1.18 mg/dL 1.15 1.0 1.05  Sodium 135 - 146 mmol/L 138 - 139  Potassium 3.5 - 5.3 mmol/L 4.3 - 4.6  Chloride 98 - 110 mmol/L 103 - 104  CO2 20 - 32 mmol/L 28 - 29  Calcium 8.6 - 10.3 mg/dL 8.9 - 9.1  Total Protein 6.1 - 8.1 g/dL 7.5 - 7.5  Total Bilirubin 0.2 - 1.2 mg/dL 0.7 - 0.7  Alkaline Phos 38 - 126 U/L - - -  AST 10 - 35 U/L 20 - 17  ALT 9 - 46 U/L 29 - 30    Recent Results  (from the past 2160 hour(s))  HgB A1c     Status: Abnormal   Collection Time: 05/18/21  8:34 AM  Result Value Ref Range   Hemoglobin A1C     HbA1c POC (<> result, manual entry)     HbA1c, POC (prediabetic range)     HbA1c, POC (controlled diabetic range) 8.5 (A) 0.0 - 7.0 %   Lipid Panel     Component Value Date/Time   CHOL 128 01/19/2019 0000   TRIG 98 01/19/2019 0000   HDL 36 01/19/2019 0000   CHOLHDL 4.5 08/01/2017 0243   VLDL 20 08/01/2017 0243   LDLCALC 73 01/19/2019 0000     Assessment & Plan:   1. Type 2 diabetes mellitus with vascular disease (HCC)  -His  diabetes is complicated by recurrent CVA , coronary artery disease, obesity/sedentary life and patient remains at extremely  high risk for more acute and chronic complications of diabetes which include CAD, CVA, CKD, retinopathy, and neuropathy. These are all discussed in detail with the patient.  Mr. Besson presents with his CGM device.  His AGP shows 69% time range, 27% slightly above range.  No major sustained hypoglycemia.  His average blood glucose 152 mg per DL over the last 14 days.  His point-of-care A1c is 8.5%, improving overall.   However he continues to have discrepant point-of-care A1c Fawze average blood glucose keeps presenting with.   - Glucose logs and insulin administration records pertaining to this visit,  to be scanned into patient's records.  Recent labs reviewed.  - I have re-counseled the patient on diet management and weight loss  by adopting a carbohydrate restricted / protein rich  Diet.  - he acknowledges that there is a room for improvement in his food and drink choices. - Suggestion is made for him to avoid simple carbohydrates  from his diet including Cakes, Sweet Desserts, Ice Cream, Soda (diet and regular), Sweet Tea, Candies, Chips, Cookies, Store Bought Juices, Alcohol , Artificial Sweeteners,  Coffee Creamer, and "Sugar-free" Products, Lemonade. This will help patient to have more stable  blood glucose profile and potentially avoid unintended weight gain.  The following Lifestyle Medicine recommendations according to [place]  [facility]) were discussed and and offered to patient and he  agrees to start the journey:  A. Whole Foods, Plant-Based Nutrition comprising of fruits  and vegetables, plant-based proteins, whole-grain carbohydrates was discussed in detail with the patient.   A list for source of those nutrients were also provided to the patient.  Patient will use only water or unsweetened tea for hydration. B.  The need to stay away from risky substances including alcohol, smoking; obtaining 7 to 9 hours of restorative sleep, at least 150 minutes of moderate intensity exercise weekly, the importance of healthy social connections,  and stress management techniques were discussed. C.  A full color page of  Calorie density of various food groups per pound showing examples of each food groups was provided to the patient.   - Patient is advised to stick to a routine mealtimes to eat 3 meals  a day and avoid unnecessary snacks ( to snack only to correct hypoglycemia).  - I have approached patient with the following individualized plan to manage diabetes and patient agrees.  -He continues to respond to U500 with better average blood glucose results.   He continues to have discrepancy between his average blood glucose and A1c. -Priority will be to decrease his frequency of hypoglycemia.  He is advised to continue  Humulin RU500 to  70 units with breakfast, 70 units with lunch, and 70 units with supper   for pre-meal blood glucose readings of 90 mg/dL or above.   -I have advised him on how he can rotate insulin injection sites on his abdominal skin. -He is warned not not to take insulin without proper monitoring of blood glucose.  He is advised to  call clinic if he registers blood glucose less than 70 mg/dL or greater than 200 mg/dL. -He is advised to  discontinue glipizide.  He will continue to benefit from  metformin-advised to continue metformin  500 mg p.o. daily at breakfast .  - Patient specific target  for A1c; LDL, HDL, Triglycerides, and  Waist Circumference were discussed in detail.  2) BP/HTN: -His blood pressure is controlled to target.   It appears that he had a reaction to benazepril with angioedema.  This medication was stopped and he is currently on losartan 25 mg p.o. daily at breakfast.     3) Lipids/HPL: His lipid panel shows controlled LDL 73.   He is advised to continue atorvastatin 80 mg p.o. nightly.    Side effects and precautions discussed with him.   4)  Weight/Diet: His BMI is 28.3-TDVVOHY complicating his diabetes care.  Marland Kitchen  Has no success for weight control mainly due to his dietary indiscretion.  He has consulted with CDE.  He is advised on weight loss surgery.  See above.  5) hypothyroidism- His recent  thyroid function tests are consistent with appropriate replacement.  He is advised to continue levothyroxine 75 mcg p.o. daily before breakfast.   - We discussed about the correct intake of his thyroid hormone, on empty stomach at fasting, with water, separated by at least 30 minutes from breakfast and other medications,  and separated by more than 4 hours from calcium, iron, multivitamins, acid reflux medications (PPIs). -Patient is made aware of the fact that thyroid hormone replacement is needed for life, dose to be adjusted by periodic monitoring of thyroid function tests.  6) Chronic Care/Health Maintenance:  -Patient  is  on ACEI/ARB and Statin medications and encouraged to continue to follow up with Ophthalmology, urologist given his recent recurrent CVA, podiatrist at least yearly or according to recommendations, and advised to  stay away from smoking. I have recommended yearly flu vaccine  and pneumonia vaccination at least every 5 years; moderate intensity exercise for up to 150 minutes weekly; and  sleep  for at least 7 hours a day.  POCT ABI was normal on April 09, 2020, next study will be repeated in January 2027, or sooner if needed.    - I advised patient to maintain close follow up with Burdine, Virgina Evener, MD for primary care needs.   I spent 44 minutes in the care of the patient today including review of labs from [place], Lipids, Thyroid Function, Hematology (current and previous including abstractions from other facilities); face-to-face time discussing  his blood glucose readings/logs, discussing hypoglycemia and hyperglycemia episodes and symptoms, medications doses, his options of short and long term treatment based on the latest standards of care / guidelines;  discussion about incorporating lifestyle medicine;  and documenting the encounter.    Please refer to Patient Instructions for Blood Glucose Monitoring and Insulin/Medications Dosing Guide"  in media tab for additional information. Please  also refer to " Patient Self Inventory" in the Media  tab for reviewed elements of pertinent patient history.  Marvene Staff Linson participated in the discussions, expressed understanding, and voiced agreement with the above plans.  All questions were answered to his satisfaction. he is encouraged to contact clinic should he have any questions or concerns prior to his return visit.   Follow up plan: -Return in about 4 months (around 09/17/2021) for F/U with Pre-visit Labs, Meter, Logs, A1c here.Glade Lloyd, MD Phone: [phone]  Fax: [phone]  -  This note was partially dictated with voice recognition software. Similar sounding words can be transcribed inadequately or may not  be corrected upon review.  05/18/2021, 10:06 AM

## 2021-05-21 ENCOUNTER — Other Ambulatory Visit: Payer: Self-pay | Admitting: "Endocrinology

## 2021-05-24 ENCOUNTER — Other Ambulatory Visit: Payer: Self-pay | Admitting: Cardiology

## 2021-05-24 ENCOUNTER — Other Ambulatory Visit: Payer: Self-pay | Admitting: "Endocrinology

## 2021-05-25 DIAGNOSIS — D123 Benign neoplasm of transverse colon: Secondary | ICD-10-CM | POA: Diagnosis not present

## 2021-05-25 DIAGNOSIS — Z6835 Body mass index (BMI) 35.0-35.9, adult: Secondary | ICD-10-CM | POA: Diagnosis not present

## 2021-06-02 ENCOUNTER — Encounter: Payer: Self-pay | Admitting: Cardiology

## 2021-06-02 ENCOUNTER — Encounter: Payer: Self-pay | Admitting: *Deleted

## 2021-06-02 ENCOUNTER — Ambulatory Visit (INDEPENDENT_AMBULATORY_CARE_PROVIDER_SITE_OTHER): Payer: Medicare Other | Admitting: Cardiology

## 2021-06-02 VITALS — BP 156/66 | HR 72 | Ht 75.0 in | Wt 282.2 lb

## 2021-06-02 DIAGNOSIS — I10 Essential (primary) hypertension: Secondary | ICD-10-CM

## 2021-06-02 DIAGNOSIS — E782 Mixed hyperlipidemia: Secondary | ICD-10-CM | POA: Diagnosis not present

## 2021-06-02 DIAGNOSIS — I2510 Atherosclerotic heart disease of native coronary artery without angina pectoris: Secondary | ICD-10-CM

## 2021-06-02 MED ORDER — LOSARTAN POTASSIUM 25 MG PO TABS: 25.0000 mg | ORAL_TABLET | Freq: Every day | ORAL | 6 refills | Status: DC

## 2021-06-02 MED ORDER — ROSUVASTATIN CALCIUM 40 MG PO TABS: 40.0000 mg | ORAL_TABLET | Freq: Every day | ORAL | 6 refills | Status: DC

## 2021-06-02 NOTE — Progress Notes (Signed)
? ? ? ?Clinical Summary ?Alex Cortez is a 71 y.o.male seen today for follow up of the following medical problems.  ?  ?  ?1. CAD ?- prior stent to LAD in June 2011 ?- cath 2013 with patent stent and vessels ?02/2018 echo: LVEF 60-65%, grade I dd ?- 06/2019 nuclear stress: inferior scar with mod peri-infarct ischemia vs artifact ? - 10/2020 [facility] LVEF [facility] pcp [facility] with normal LVEF ? ?- no recent chest pains. Chronic SOB unchanged  ?  ?2. HTN ?- compliant with meds  ?- home bp's 140s/60s ?- recent other visits other providers 126/64, 144/60, 138/74 ?  ?  ?3. History of CVA ?- has been on plavix ?  ?4. DM2 ?  ?5. NSVT ?-no palpitations.  ?  ?6. Tobacco history ?- no cough, no wheezing ?- wants to think about breathing tests ?- 2017 255 lbs ?- he thinks he had PFTs with pcp ? ?7. Severe OSA ?- 01/19/2021 abnoral sleep study, severe OSA ?- has not gotten cpap machine.  ? ?8. Hyperlipidemia ?- 12/2020 TC 133 TG 104 HDL 26 LDL 87 ?- he is on atorvastatin '80mg'$  daily.  ?  ?AAA screen: over 87, smoking history. AAA [place] was negative ? ? ?Past Medical History:  ?Diagnosis Date  ? CAD (coronary artery disease)   ? nonobstructive CAD/patent LAD stent site; EF 65%, 08/2011; NSTEMI/DES LAD, 6/20 11  ? Chest pain   ? Diabetes mellitus   ? insulin-dependent  ? Dyslipidemia   ? HTN (hypertension)   ? Post MI syndrome ([place]) 08-29-2009  ? Stroke ([place]) 10/22/2012  ? Left inferior pons  ? ? ? ?Allergies  ?Allergen Reactions  ? Ace Inhibitors Swelling  ?  Angioedema  ? Other Swelling  ?  Shellfish  ?Swelling and hives  ? ? ? ?Current Outpatient Medications  ?Medication Sig Dispense Refill  ? amLODipine (NORVASC) 10 MG tablet Take 10 mg by mouth daily. for high blood pressure    ? atorvastatin (LIPITOR) 80 MG tablet Take 1 tablet (80 mg total) by mouth every evening. 90 tablet 3  ? carvedilol (COREG) 25 MG tablet Take 1 tablet (25 mg total) by mouth 2 (two) times daily. 180 tablet 1  ? clopidogrel (PLAVIX) 75 MG tablet Take 1 tablet (75 mg total)  by mouth daily. 30 tablet 5  ? Continuous Blood Gluc Sensor (FREESTYLE LIBRE 14 DAY SENSOR) MISC USE AND DISCARD 1 SENSOR   EVERY 14 DAYS 6 each 2  ? fluticasone (FLONASE) 50 MCG/ACT nasal spray Place 2 sprays into both nostrils daily.    ? insulin regular human CONCENTRATED (HUMULIN R U-500 KWIKPEN) 500 UNIT/ML KwikPen INJECT AS DIRECTED 70 UNITS WITH BREAKFAST, 70 UNITS WITH LUNCH, AND 70 UNITS WITH SUPPER FOR PRE-MEAL BLOOD GLUCOSE READINGS OF '90MG'$ /DL OR ABOVE 30 mL 1  ? isosorbide mononitrate (IMDUR) 30 MG 24 hr tablet TAKE 1 TABLET BY MOUTH EVERY DAY 90 tablet 1  ? levothyroxine (SYNTHROID) 75 MCG tablet TAKE 1 TABLET BY MOUTH EVERY DAY BEFORE BREAKFAST 90 tablet 0  ? losartan (COZAAR) 25 MG tablet Take 25 mg by mouth daily.    ? metFORMIN (GLUCOPHAGE) 1000 MG tablet Take 1,000 mg by mouth daily with breakfast.  3  ? nitroGLYCERIN (NITROSTAT) 0.4 MG SL tablet Place 1 tablet (0.4 mg total) under the tongue every 5 (five) minutes as needed. 25 tablet 3  ? ONE TOUCH ULTRA TEST test strip USE TO TEST BLOOD SUGAR FOUR TIMES DAILY E11.65 150 each  5  ? ?No current facility-administered medications for this visit.  ? ? ? ?Past Surgical History:  ?Procedure Laterality Date  ? CORONARY ANGIOPLASTY WITH STENT PLACEMENT    ? ? ? ?Allergies  ?Allergen Reactions  ? Ace Inhibitors Swelling  ?  Angioedema  ? Other Swelling  ?  Shellfish  ?Swelling and hives  ? ? ? ? ?Family History  ?Problem Relation Age of Onset  ? Hypertension Other   ?     in all family members  ? ? ? ?Social History ?Mr. Diffee reports that he quit smoking about 33 years ago. His smoking use included cigarettes. He started smoking about 66 years ago. He has a 11.50 pack-year smoking history. He has never used smokeless tobacco. ?Mr. Rossitto reports no history of alcohol use. ? ? ?Review of Systems ?CONSTITUTIONAL: No weight loss, fever, chills, weakness or fatigue.  ?HEENT: Eyes: No visual loss, blurred vision, double vision or yellow sclerae.No hearing loss,  sneezing, congestion, runny nose or sore throat.  ?SKIN: No rash or itching.  ?CARDIOVASCULAR: per hpi ?RESPIRATORY: per hpi ?GASTROINTESTINAL: No anorexia, nausea, vomiting or diarrhea. No abdominal pain or blood.  ?GENITOURINARY: No burning on urination, no polyuria ?NEUROLOGICAL: No headache, dizziness, syncope, paralysis, ataxia, numbness or tingling in the extremities. No change in bowel or bladder control.  ?MUSCULOSKELETAL: No muscle, back pain, joint pain or stiffness.  ?LYMPHATICS: No enlarged nodes. No history of splenectomy.  ?PSYCHIATRIC: No history of depression or anxiety.  ?ENDOCRINOLOGIC: No reports of sweating, cold or heat intolerance. No polyuria or polydipsia.  ?. ? ? ?Physical Examination ?Today's Vitals  ? 06/02/21 0943  ?BP: (!) 156/66  ?Pulse: 72  ?SpO2: 98%  ?Weight: 282 lb 3.2 oz (128 kg)  ?Height: '6\' 3"'$  (1.905 m)  ? ?Body mass index is 35.27 kg/m?. ? ?Gen: resting comfortably, no acute distress ?HEENT: no scleral icterus, pupils equal round and reactive, no palptable cervical adenopathy,  ?CV: RRR, 2/6 systolic murmur rusb, no jvd ?Resp: Clear to auscultation bilaterally ?GI: abdomen is soft, non-tender, non-distended, normal bowel sounds, no hepatosplenomegaly ?MSK: extremities are warm, no edema.  ?Skin: warm, no rash ?Neuro:  no focal deficits ?Psych: appropriate affect ? ? ?Diagnostic Studies ? ?Nuclear stress test 02/15/2018: ?  ?No diagnostic ST segment changes to indicate ischemia. ?Small, mild intensity, apical to basal inferior/inferolateral defect that is partially reversible. This is consistent with a mild ischemic territory versus variable diaphragmatic attenuation. ?This is an intermediate risk study. ?Nuclear stress EF: 44%. Visually, LVEF appears preserved, suggest echocardiogram to confirm. ?  ?Echocardiogram 02/20/2018 (limited): ?  ?Left ventricle: The cavity size was normal. There was moderate ?  concentric hypertrophy. Systolic function was normal. The ?  estimated  ejection fraction was in the range of 60% to 65%. LVEF ?  64.8% by GLS. Wall motion was normal; there were no regional wall ?  motion abnormalities. Doppler parameters are consistent with ?  abnormal left ventricular relaxation (grade 1 diastolic ?  dysfunction). Doppler parameters are consistent with high ?  ventricular filling pressure. ?- Aortic valve: Trileaflet; mildly thickened leaflets. ?- Mitral valve: Mild focal calcification of tip of anterior ?  leaflet. Mildly calcified annulus. ? ?Cardiac catheterization 09/03/2011: ?  ?Hemodynamics:  ?  ?RA: 13/11/9 ?RV: 34/7/9 ?PCWP: 11/9/8 ?PA:  28/13/19 ?  ?Cardiac Output  ?            Thermodilution: 6.0 with index of 2.5 ?  Fick : 5.6 with index of 2.3 ?  ?Arterial Sat: 94 ?PA Sat: 65. ?  ?LV pressure: 158/19/17 ?Aortic pressure: 153/90/115 ?  ?Angiography  ?  ?Left Main: The left main has minor luminal regularities but no significant stenosis. ?  ?Left anterior Descending: The LAD has diffuse mild right allergies. There is a stent in the mid LAD which is widely patent. The distal LAD has minor luminal irregularities. There is a 40-50% stenosis in the very distal aspect of the LAD.  The first diagonal Shahed Yeoman is a fairly large Zorian Gunderman. There are minor little regular disease but no significant stenosis. The second diagonal artery is a moderate-sized vessel. There is a 50% focal stenosis. ?  ?Left Circumflex: The left circumflex artery is a moderate-sized vessel. There are minor luminal irregularities ?  ?Right Coronary Artery: The right coronary artery is large and dominant. There are diffuse irregularities throughout the RCA. There is a 40% stenosis in the distal RCA. This is followed by  a focal 50% stenosis just prior to the bifurcation of 2 posterior lateral branches. This lesion does not appear to obstruct flow. ?  ?LV Gram: The left ventricular function is normal. Ejection fraction is roughly 65%. ?  ?Complications: No apparent complications ?Patient  did tolerate procedure well. ?  ?Conclusions:   ?1. Mild to moderate coronary artery disease. The stent in the LAD is widely patent. There are no significant irregularities to explain his symptoms. ?2. Norm

## 2021-06-02 NOTE — Patient Instructions (Addendum)
Medication Instructions:  ?Increase Losartan to '50mg'$  daily ?Stop Lipitor (Atorvastatin) ?Begin Crestor '40mg'$  daily  ?Continue all other medications.    ? ?Labwork: ?BMET - order given today  ?Please do in 2 weeks (around 06/16/2021) ?Office will contact with results via phone or letter.    ? ?Testing/Procedures: ?none ? ?Follow-Up: ?6 months  ? ?Any Other Special Instructions Will Be Listed Below (If Applicable). ? ? ?If you need a refill on your cardiac medications before your next appointment, please call your pharmacy. ? ?

## 2021-06-12 DIAGNOSIS — I10 Essential (primary) hypertension: Secondary | ICD-10-CM | POA: Diagnosis not present

## 2021-06-13 LAB — BASIC METABOLIC PANEL
BUN/Creatinine Ratio: 12 (calc) (ref 6–22)
BUN: 17 mg/dL (ref 7–25)
CO2: 24 mmol/L (ref 20–32)
Calcium: 9.3 mg/dL (ref 8.6–10.3)
Chloride: 104 mmol/L (ref 98–110)
Creat: 1.43 mg/dL — ABNORMAL HIGH (ref 0.70–1.28)
Glucose, Bld: 249 mg/dL — ABNORMAL HIGH (ref 65–99)
Potassium: 4.5 mmol/L (ref 3.5–5.3)
Sodium: 139 mmol/L (ref 135–146)

## 2021-07-12 DIAGNOSIS — E7849 Other hyperlipidemia: Secondary | ICD-10-CM | POA: Diagnosis not present

## 2021-07-12 DIAGNOSIS — I110 Hypertensive heart disease with heart failure: Secondary | ICD-10-CM | POA: Diagnosis not present

## 2021-07-12 DIAGNOSIS — I5032 Chronic diastolic (congestive) heart failure: Secondary | ICD-10-CM | POA: Diagnosis not present

## 2021-07-12 DIAGNOSIS — E11649 Type 2 diabetes mellitus with hypoglycemia without coma: Secondary | ICD-10-CM | POA: Diagnosis not present

## 2021-07-14 DIAGNOSIS — E039 Hypothyroidism, unspecified: Secondary | ICD-10-CM | POA: Diagnosis not present

## 2021-07-14 DIAGNOSIS — E782 Mixed hyperlipidemia: Secondary | ICD-10-CM | POA: Diagnosis not present

## 2021-07-14 DIAGNOSIS — E7849 Other hyperlipidemia: Secondary | ICD-10-CM | POA: Diagnosis not present

## 2021-07-14 DIAGNOSIS — I10 Essential (primary) hypertension: Secondary | ICD-10-CM | POA: Diagnosis not present

## 2021-07-14 DIAGNOSIS — E1122 Type 2 diabetes mellitus with diabetic chronic kidney disease: Secondary | ICD-10-CM | POA: Diagnosis not present

## 2021-07-14 DIAGNOSIS — R5383 Other fatigue: Secondary | ICD-10-CM | POA: Diagnosis not present

## 2021-07-21 DIAGNOSIS — I5032 Chronic diastolic (congestive) heart failure: Secondary | ICD-10-CM | POA: Diagnosis not present

## 2021-07-21 DIAGNOSIS — Z1389 Encounter for screening for other disorder: Secondary | ICD-10-CM | POA: Diagnosis not present

## 2021-07-21 DIAGNOSIS — Z955 Presence of coronary angioplasty implant and graft: Secondary | ICD-10-CM | POA: Diagnosis not present

## 2021-07-21 DIAGNOSIS — Z23 Encounter for immunization: Secondary | ICD-10-CM | POA: Diagnosis not present

## 2021-07-21 DIAGNOSIS — I10 Essential (primary) hypertension: Secondary | ICD-10-CM | POA: Diagnosis not present

## 2021-07-21 DIAGNOSIS — E1122 Type 2 diabetes mellitus with diabetic chronic kidney disease: Secondary | ICD-10-CM | POA: Diagnosis not present

## 2021-07-21 DIAGNOSIS — K760 Fatty (change of) liver, not elsewhere classified: Secondary | ICD-10-CM | POA: Diagnosis not present

## 2021-07-21 DIAGNOSIS — Z1331 Encounter for screening for depression: Secondary | ICD-10-CM | POA: Diagnosis not present

## 2021-08-10 ENCOUNTER — Other Ambulatory Visit: Payer: Self-pay | Admitting: "Endocrinology

## 2021-08-11 ENCOUNTER — Other Ambulatory Visit: Payer: Self-pay | Admitting: "Endocrinology

## 2021-08-13 DIAGNOSIS — I872 Venous insufficiency (chronic) (peripheral): Secondary | ICD-10-CM | POA: Diagnosis not present

## 2021-08-13 HISTORY — PX: COLONOSCOPY: SHX174

## 2021-08-20 ENCOUNTER — Other Ambulatory Visit: Payer: Self-pay | Admitting: "Endocrinology

## 2021-08-20 DIAGNOSIS — E1122 Type 2 diabetes mellitus with diabetic chronic kidney disease: Secondary | ICD-10-CM | POA: Diagnosis not present

## 2021-08-20 DIAGNOSIS — E669 Obesity, unspecified: Secondary | ICD-10-CM | POA: Diagnosis not present

## 2021-08-20 DIAGNOSIS — Z1211 Encounter for screening for malignant neoplasm of colon: Secondary | ICD-10-CM | POA: Diagnosis not present

## 2021-08-20 DIAGNOSIS — I130 Hypertensive heart and chronic kidney disease with heart failure and stage 1 through stage 4 chronic kidney disease, or unspecified chronic kidney disease: Secondary | ICD-10-CM | POA: Diagnosis not present

## 2021-08-20 DIAGNOSIS — Z955 Presence of coronary angioplasty implant and graft: Secondary | ICD-10-CM | POA: Diagnosis not present

## 2021-08-20 DIAGNOSIS — Z79899 Other long term (current) drug therapy: Secondary | ICD-10-CM | POA: Diagnosis not present

## 2021-08-20 DIAGNOSIS — Z8616 Personal history of COVID-19: Secondary | ICD-10-CM | POA: Diagnosis not present

## 2021-08-20 DIAGNOSIS — G4730 Sleep apnea, unspecified: Secondary | ICD-10-CM | POA: Diagnosis not present

## 2021-08-20 DIAGNOSIS — Z7902 Long term (current) use of antithrombotics/antiplatelets: Secondary | ICD-10-CM | POA: Diagnosis not present

## 2021-08-20 DIAGNOSIS — I700 Atherosclerosis of aorta: Secondary | ICD-10-CM | POA: Diagnosis not present

## 2021-08-20 DIAGNOSIS — Z794 Long term (current) use of insulin: Secondary | ICD-10-CM | POA: Diagnosis not present

## 2021-08-20 DIAGNOSIS — K635 Polyp of colon: Secondary | ICD-10-CM | POA: Diagnosis not present

## 2021-08-20 DIAGNOSIS — Z79890 Hormone replacement therapy (postmenopausal): Secondary | ICD-10-CM | POA: Diagnosis not present

## 2021-08-20 DIAGNOSIS — I5032 Chronic diastolic (congestive) heart failure: Secondary | ICD-10-CM | POA: Diagnosis not present

## 2021-08-20 DIAGNOSIS — E039 Hypothyroidism, unspecified: Secondary | ICD-10-CM | POA: Diagnosis not present

## 2021-08-20 DIAGNOSIS — N189 Chronic kidney disease, unspecified: Secondary | ICD-10-CM | POA: Diagnosis not present

## 2021-08-20 DIAGNOSIS — E1165 Type 2 diabetes mellitus with hyperglycemia: Secondary | ICD-10-CM | POA: Diagnosis not present

## 2021-08-20 DIAGNOSIS — I69351 Hemiplegia and hemiparesis following cerebral infarction affecting right dominant side: Secondary | ICD-10-CM | POA: Diagnosis not present

## 2021-08-20 DIAGNOSIS — D122 Benign neoplasm of ascending colon: Secondary | ICD-10-CM | POA: Diagnosis not present

## 2021-08-20 DIAGNOSIS — Z86010 Personal history of colonic polyps: Secondary | ICD-10-CM | POA: Diagnosis not present

## 2021-08-20 DIAGNOSIS — K760 Fatty (change of) liver, not elsewhere classified: Secondary | ICD-10-CM | POA: Diagnosis not present

## 2021-08-20 DIAGNOSIS — Z7984 Long term (current) use of oral hypoglycemic drugs: Secondary | ICD-10-CM | POA: Diagnosis not present

## 2021-08-20 DIAGNOSIS — I2510 Atherosclerotic heart disease of native coronary artery without angina pectoris: Secondary | ICD-10-CM | POA: Diagnosis not present

## 2021-08-21 ENCOUNTER — Other Ambulatory Visit: Payer: Self-pay | Admitting: "Endocrinology

## 2021-08-21 MED ORDER — FREESTYLE LIBRE 2 SENSOR MISC: 1.0000 | 3 refills | Status: DC

## 2021-08-21 MED ORDER — FREESTYLE LIBRE 2 READER DEVI: 0 refills | Status: DC

## 2021-08-23 ENCOUNTER — Other Ambulatory Visit: Payer: Self-pay | Admitting: "Endocrinology

## 2021-09-07 ENCOUNTER — Other Ambulatory Visit: Payer: Self-pay

## 2021-09-07 MED ORDER — FREESTYLE LIBRE 2 SENSOR MISC: 1.0000 | 1 refills | Status: DC

## 2021-09-09 DIAGNOSIS — D123 Benign neoplasm of transverse colon: Secondary | ICD-10-CM | POA: Diagnosis not present

## 2021-09-09 DIAGNOSIS — Z6841 Body Mass Index (BMI) 40.0 and over, adult: Secondary | ICD-10-CM | POA: Diagnosis not present

## 2021-09-10 ENCOUNTER — Telehealth: Payer: Self-pay | Admitting: "Endocrinology

## 2021-09-10 MED ORDER — FREESTYLE LIBRE 2 SENSOR MISC: 1.0000 | 1 refills | Status: DC

## 2021-09-10 NOTE — Telephone Encounter (Signed)
[facility] states they did not receive the rx for libre sensors that were sent 6/26 and is requesting this to be resent.  Continuous Blood Gluc Sensor (FREESTYLE LIBRE 2 SENSOR) MISC   [facility] [place] to Registered Brenda Sites Phone:  [phone]  Fax:  [phone]

## 2021-09-10 NOTE — Telephone Encounter (Signed)
Rx sent 

## 2021-09-11 DIAGNOSIS — E1159 Type 2 diabetes mellitus with other circulatory complications: Secondary | ICD-10-CM | POA: Diagnosis not present

## 2021-09-11 DIAGNOSIS — E039 Hypothyroidism, unspecified: Secondary | ICD-10-CM | POA: Diagnosis not present

## 2021-09-18 ENCOUNTER — Encounter: Payer: Self-pay | Admitting: "Endocrinology

## 2021-09-18 ENCOUNTER — Ambulatory Visit (INDEPENDENT_AMBULATORY_CARE_PROVIDER_SITE_OTHER): Payer: Medicare Other | Admitting: "Endocrinology

## 2021-09-18 VITALS — BP 130/72 | HR 76 | Ht 75.0 in | Wt 278.0 lb

## 2021-09-18 DIAGNOSIS — E1159 Type 2 diabetes mellitus with other circulatory complications: Secondary | ICD-10-CM | POA: Diagnosis not present

## 2021-09-18 DIAGNOSIS — E039 Hypothyroidism, unspecified: Secondary | ICD-10-CM | POA: Diagnosis not present

## 2021-09-18 DIAGNOSIS — E782 Mixed hyperlipidemia: Secondary | ICD-10-CM

## 2021-09-18 DIAGNOSIS — I2510 Atherosclerotic heart disease of native coronary artery without angina pectoris: Secondary | ICD-10-CM

## 2021-09-18 DIAGNOSIS — I10 Essential (primary) hypertension: Secondary | ICD-10-CM | POA: Diagnosis not present

## 2021-09-18 LAB — COMPREHENSIVE METABOLIC PANEL
ALT: 18 IU/L (ref 0–44)
AST: 16 IU/L (ref 0–40)
Albumin/Globulin Ratio: 1.1 — ABNORMAL LOW (ref 1.2–2.2)
Albumin: 4.3 g/dL (ref 3.7–4.7)
Alkaline Phosphatase: 67 IU/L (ref 44–121)
BUN/Creatinine Ratio: 20 (ref 10–24)
BUN: 24 mg/dL (ref 8–27)
Bilirubin Total: 0.3 mg/dL (ref 0.0–1.2)
CO2: 17 mmol/L — ABNORMAL LOW (ref 20–29)
Calcium: 9.4 mg/dL (ref 8.6–10.2)
Chloride: 104 mmol/L (ref 96–106)
Creatinine, Ser: 1.23 mg/dL (ref 0.76–1.27)
Globulin, Total: 3.8 g/dL (ref 1.5–4.5)
Glucose: 51 mg/dL — ABNORMAL LOW (ref 70–99)
Potassium: 3.9 mmol/L (ref 3.5–5.2)
Sodium: 142 mmol/L (ref 134–144)
Total Protein: 8.1 g/dL (ref 6.0–8.5)
eGFR: 63 mL/min/{1.73_m2} (ref >59)

## 2021-09-18 LAB — POCT GLYCOSYLATED HEMOGLOBIN (HGB A1C): HbA1c, POC (controlled diabetic range): 9.2 % — AB (ref 0.0–7.0)

## 2021-09-18 LAB — LIPID PANEL
Chol/HDL Ratio: 4.1 ratio (ref 0.0–5.0)
Cholesterol, Total: 145 mg/dL (ref 100–199)
HDL: 35 mg/dL — ABNORMAL LOW (ref >39)
LDL Chol Calc (NIH): 89 mg/dL (ref 0–99)
Triglycerides: 113 mg/dL (ref 0–149)
VLDL Cholesterol Cal: 21 mg/dL (ref 5–40)

## 2021-09-18 LAB — T4, FREE: Free T4: 1.24 ng/dL (ref 0.82–1.77)

## 2021-09-18 LAB — TSH: TSH: 2.26 u[IU]/mL (ref 0.450–4.500)

## 2021-09-18 MED ORDER — DAPAGLIFLOZIN PROPANEDIOL 5 MG PO TABS: 5.0000 mg | ORAL_TABLET | Freq: Every day | ORAL | 2 refills | Status: DC

## 2021-09-18 MED ORDER — HUMULIN R U-500 KWIKPEN 500 UNIT/ML ~~LOC~~ SOPN: PEN_INJECTOR | SUBCUTANEOUS | 1 refills | Status: DC

## 2021-09-18 NOTE — Progress Notes (Signed)
09/18/2021  Endocrinology follow-up note   Subjective:    Patient ID: [name], male    DOB: [birth year], PCP Pleas Koch Virgina Evener, MD   Past Medical History:  Diagnosis Date   CAD (coronary artery disease)    nonobstructive CAD/patent LAD stent site; EF 65%, 08/2011; NSTEMI/DES LAD, 6/20 11   Chest pain    Diabetes mellitus    insulin-dependent   Dyslipidemia    HTN (hypertension)    Post MI syndrome ([place]) 08-29-2009   Stroke ([place]) 10/22/2012   Left inferior pons   Past Surgical History:  Procedure Laterality Date   COLONOSCOPY  08/2021   CORONARY ANGIOPLASTY WITH STENT PLACEMENT     Social History   Socioeconomic History   Marital status: Married    Spouse name: Not on file   Number of children: Not on file   Years of education: Not on file   Highest education level: Not on file  Occupational History   Not on file  Tobacco Use   Smoking status: Former    Packs/day: 0.50    Years: 23.00    Total pack years: 11.50    Types: Cigarettes    Start date: 03/16/1955    Quit date: 03/15/1988    Years since quitting: 33.5   Smokeless tobacco: Never  Vaping Use   Vaping Use: Never used  Substance and Sexual Activity   Alcohol use: No    Alcohol/week: 0.0 standard drinks of alcohol   Drug use: No   Sexual activity: Not on file  Other Topics Concern   Not on file  Social History Narrative   Works at [employer]. Married.    Social Determinants of Health   Financial Resource Strain: Not on file  Food Insecurity: Not on file  Transportation Needs: Not on file  Physical Activity: Not on file  Stress: Not on file  Social Connections: Unknown (07/31/2017)   Social Connection and Isolation Panel [NHANES]    Frequency of Communication with Friends and Family: Patient refused    Frequency of Social Gatherings with Friends and Family: Patient refused    Attends Religious Services: Patient refused    Active Member of Clubs or Organizations: Patient refused    Attends  Archivist Meetings: Patient refused    Marital Status: Patient refused   Outpatient Encounter Medications as of 09/18/2021  Medication Sig   dapagliflozin propanediol (FARXIGA) 5 MG TABS tablet Take 1 tablet (5 mg total) by mouth daily before breakfast.   amLODipine (NORVASC) 10 MG tablet Take 10 mg by mouth daily. for high blood pressure   carvedilol (COREG) 25 MG tablet Take 1 tablet (25 mg total) by mouth 2 (two) times daily.   clopidogrel (PLAVIX) 75 MG tablet Take 1 tablet (75 mg total) by mouth daily.   Continuous Blood Gluc Receiver (FREESTYLE LIBRE 2 READER) DEVI As directed   Continuous Blood Gluc Sensor (FREESTYLE LIBRE 2 SENSOR) MISC 1 Piece by Does not apply route every 14 (fourteen) days.   fluticasone (FLONASE) 50 MCG/ACT nasal spray Place 2 sprays into both nostrils daily.   insulin regular human CONCENTRATED (HUMULIN R U-500 KWIKPEN) 500 UNIT/ML KwikPen INJECT AS DIRECTED 80 UNITS WITH BREAKFAST, 80 UNITS WITH LUNCH, AND 80 UNITS WITH SUPPER FOR PRE-MEAL BLOOD GLUCOSE READINGS OF 90MG/DL OR ABOVE   isosorbide mononitrate (IMDUR) 30 MG 24 hr tablet TAKE 1 TABLET BY MOUTH EVERY DAY   levothyroxine (SYNTHROID) 75 MCG tablet TAKE 1 TABLET BY MOUTH EVERY DAY  BEFORE BREAKFAST   losartan (COZAAR) 25 MG tablet Take 1 tablet (25 mg total) by mouth daily.   metFORMIN (GLUCOPHAGE) 1000 MG tablet Take 1,000 mg by mouth daily with breakfast.   nitroGLYCERIN (NITROSTAT) 0.4 MG SL tablet Place 1 tablet (0.4 mg total) under the tongue every 5 (five) minutes as needed.   ONE TOUCH ULTRA TEST test strip USE TO TEST BLOOD SUGAR FOUR TIMES DAILY E11.65   rosuvastatin (CRESTOR) 40 MG tablet Take 1 tablet (40 mg total) by mouth daily.   [DISCONTINUED] insulin regular human CONCENTRATED (HUMULIN R U-500 KWIKPEN) 500 UNIT/ML KwikPen INJECT AS DIRECTED 70 UNITS WITH BREAKFAST, 70 UNITS WITH LUNCH, AND 70 UNITS WITH SUPPER FOR PRE-MEAL BLOOD GLUCOSE READINGS OF 90MG/DL OR ABOVE   No  facility-administered encounter medications on file as of 09/18/2021.   ALLERGIES: Allergies  Allergen Reactions   Ace Inhibitors Swelling    Angioedema   Other Swelling    Shellfish  Swelling and hives   VACCINATION STATUS: Immunization History  Administered Date(s) Administered   Moderna Sars-Covid-2 Vaccination 04/19/2019, 05/17/2019     Diabetes He presents for his follow-up diabetic visit. He has type 2 diabetes mellitus. Onset time: He was diagnosed at approximate age of 65 years. His disease course has been worsening. There are no hypoglycemic associated symptoms. Pertinent negatives for hypoglycemia include no confusion, headaches, pallor or seizures. Pertinent negatives for diabetes include no chest pain, no fatigue, no polydipsia, no polyphagia, no polyuria and no weakness. There are no hypoglycemic complications. Symptoms are worsening. Diabetic complications include a CVA, heart disease and nephropathy. Risk factors for coronary artery disease include diabetes mellitus, dyslipidemia, hypertension, male sex, obesity, sedentary lifestyle and tobacco exposure. Current diabetic treatment includes intensive insulin program. He is compliant with treatment most of the time. His weight is fluctuating minimally. He is following a generally unhealthy (He consumes a large quantity of soda.) diet. When asked about meal planning, he reported none. He has had a previous visit with a dietitian. He never participates in exercise. His home blood glucose trend is increasing steadily. His breakfast blood glucose range is generally >200 mg/dl. His lunch blood glucose range is generally >200 mg/dl. His dinner blood glucose range is generally >200 mg/dl. His bedtime blood glucose range is generally >200 mg/dl. His overall blood glucose range is >200 mg/dl. (Mr. Hardenbrook presents with his CGM device.  His device was downloaded and analyzed.  Over the last 14 days, he has 36% time in range, 30% level 1  hyperglycemia, 33% level 2 hyperglycemia.  No significant hypoglycemia.  His average blood glucose is 213.  His point-of-care A1c is 9.2%, increasing from 8.5%.   ) An ACE inhibitor/angiotensin II receptor blocker is being taken. Eye exam is current.  Hyperlipidemia This is a chronic problem. The current episode started more than 1 year ago. The problem is controlled. Exacerbating diseases include chronic renal disease, diabetes and obesity. Pertinent negatives include no chest pain, leg pain, myalgias or shortness of breath. Current antihyperlipidemic treatment includes statins. Risk factors for coronary artery disease include diabetes mellitus, dyslipidemia, hypertension, male sex, obesity and a sedentary lifestyle.  Hypertension This is a chronic problem. The current episode started more than 1 year ago. The problem is controlled. Pertinent negatives include no chest pain, headaches, neck pain, palpitations or shortness of breath. Risk factors for coronary artery disease include obesity, male gender, dyslipidemia, diabetes mellitus, sedentary lifestyle and family history. Past treatments include ACE inhibitors. Hypertensive end-organ damage includes kidney disease  and CVA. Identifiable causes of hypertension include chronic renal disease.     Review of systems  Constitutional: + Gaining weight,  current  Body mass index is 34.75 kg/m. , no fatigue, no subjective hyperthermia, no subjective hypothermia    Objective:    BP 130/72   Pulse 76   Ht '6\' 3"'  (1.905 m)   Wt 278 lb (126.1 kg)   BMI 34.75 kg/m   Wt Readings from Last 3 Encounters:  09/18/21 278 lb (126.1 kg)  06/02/21 282 lb 3.2 oz (128 kg)  05/18/21 279 lb 3.2 oz (126.6 kg)      Physical Exam- Limited  Constitutional:  Body mass index is 34.75 kg/m. , not in acute distress, normal state of mind Eyes:  EOMI, no exophthalmos Neck: Supple  Respiratory: Adequate breathing efforts Musculoskeletal: no gross deformities,  strength intact in all four extremities, no gross restriction of joint movements Skin:  no rashes, no hyperemia, + lipodystrophy on abdominal skin Neurological: no tremor with outstretched hands       Latest Ref Rng & Units 09/11/2021    8:10 AM 06/12/2021    7:57 AM 01/01/2020    7:59 AM  CMP  Glucose 70 - 99 mg/dL 51  249  253   BUN 8 - 27 mg/dL '24  17  13   ' Creatinine 0.76 - 1.27 mg/dL 1.23  1.43  1.15   Sodium 134 - 144 mmol/L 142  139  138   Potassium 3.5 - 5.2 mmol/L 3.9  4.5  4.3   Chloride 96 - 106 mmol/L 104  104  103   CO2 20 - 29 mmol/L '17  24  28   ' Calcium 8.6 - 10.2 mg/dL 9.4  9.3  8.9   Total Protein 6.0 - 8.5 g/dL 8.1   7.5   Total Bilirubin 0.0 - 1.2 mg/dL 0.3   0.7   Alkaline Phos 44 - 121 IU/L 67     AST 0 - 40 IU/L 16   20   ALT 0 - 44 IU/L 18   29     Recent Results (from the past 2160 hour(s))  Comprehensive metabolic panel     Status: Abnormal   Collection Time: 09/11/21  8:10 AM  Result Value Ref Range   Glucose 51 (L) 70 - 99 mg/dL   BUN 24 8 - 27 mg/dL   Creatinine, Ser 1.23 0.76 - 1.27 mg/dL   eGFR 63 >59 mL/min/1.73   BUN/Creatinine Ratio 20 10 - 24   Sodium 142 134 - 144 mmol/L   Potassium 3.9 3.5 - 5.2 mmol/L   Chloride 104 96 - 106 mmol/L   CO2 17 (L) 20 - 29 mmol/L   Calcium 9.4 8.6 - 10.2 mg/dL   Total Protein 8.1 6.0 - 8.5 g/dL   Albumin 4.3 3.7 - 4.7 g/dL    Comment:                **Effective September 21, 2021 Albumin reference interval**                  will be changing to:                             Age                  Male          Male  0 -   7 days       3.6 - 4.9      3.6 - 4.9                            8 -  30 days       3.5 - 4.6      3.5 - 4.6                            1 -   6 months     3.7 - 4.8      3.7 - 4.8                     7 months -   2 years      4.0 - 5.0      4.0 - 5.0                            3 -   5 years      4.1 - 5.0      4.1 - 5.0                            6 -  12 years       4.2 - 5.0      4.2 - 5.0                           13 -  30 years      4.3 - 5.2      4.0 - 5.0                           31 -  50 years      4.1 - 5.1      3.9 - 4.9                           51 -  60 years      3.8 - 4.9      3.8 - 4.9                           61 -  70 years      3.9 - 4.9      3.9 - 4.9                           71 -  80 years      3.8 - 4.8      3.8 - 4.'8                           8 1 ' -  89 years      3.7 - 4.7      3.7 - 4.7                           90 - 199 years      3.6 - 4.6      3.6 - 4.6    Globulin, Total 3.8 1.5 - 4.5 g/dL  Albumin/Globulin Ratio 1.1 (L) 1.2 - 2.2   Bilirubin Total 0.3 0.0 - 1.2 mg/dL   Alkaline Phosphatase 67 44 - 121 IU/L   AST 16 0 - 40 IU/L   ALT 18 0 - 44 IU/L  Lipid panel     Status: Abnormal   Collection Time: 09/11/21  8:10 AM  Result Value Ref Range   Cholesterol, Total 145 100 - 199 mg/dL   Triglycerides 113 0 - 149 mg/dL   HDL 35 (L) >39 mg/dL   VLDL Cholesterol Cal 21 5 - 40 mg/dL   LDL Chol Calc (NIH) 89 0 - 99 mg/dL   Chol/HDL Ratio 4.1 0.0 - 5.0 ratio    Comment:                                   T. Chol/HDL Ratio                                             Men  Women                               1/2 Avg.Risk  3.4    3.3                                   Avg.Risk  5.0    4.4                                2X Avg.Risk  9.6    7.1                                3X Avg.Risk 23.4   11.0   TSH     Status: None   Collection Time: 09/11/21  8:10 AM  Result Value Ref Range   TSH 2.260 0.450 - 4.500 uIU/mL  T4, free     Status: None   Collection Time: 09/11/21  8:10 AM  Result Value Ref Range   Free T4 1.24 0.82 - 1.77 ng/dL  HgB A1c     Status: Abnormal   Collection Time: 09/18/21  8:31 AM  Result Value Ref Range   Hemoglobin A1C     HbA1c POC (<> result, manual entry)     HbA1c, POC (prediabetic range)     HbA1c, POC (controlled diabetic range) 9.2 (A) 0.0 - 7.0 %   Lipid Panel     Component Value Date/Time    CHOL 145 09/11/2021 0810   TRIG 113 09/11/2021 0810   HDL 35 (L) 09/11/2021 0810   CHOLHDL 4.1 09/11/2021 0810   CHOLHDL 4.5 08/01/2017 0243   VLDL 20 08/01/2017 0243   LDLCALC 89 09/11/2021 0810   LABVLDL 21 09/11/2021 0810     Assessment & Plan:   1. Type 2 diabetes mellitus with vascular disease (HCC)  -His  diabetes is complicated by recurrent CVA , coronary artery disease, obesity/sedentary life and patient remains at extremely  high risk for more acute and chronic complications of diabetes which include CAD, CVA, CKD, retinopathy, and neuropathy. These are  all discussed in detail with the patient.  Mr. Swaim presents with his CGM device.  His device was downloaded and analyzed.  Over the last 14 days, he has 36% time in range, 30% level 1 hyperglycemia, 33% level 2 hyperglycemia.  No significant hypoglycemia.  His average blood glucose is 213.  His point-of-care A1c is 9.2%, increasing from 8.5%.     - Glucose logs and insulin administration records pertaining to this visit,  to be scanned into patient's records.  Recent labs reviewed.  - I have re-counseled the patient on diet management and weight loss  by adopting a carbohydrate restricted / protein rich  Diet.   - he acknowledges that there is a room for improvement in his food and drink choices. - Suggestion is made for him to avoid simple carbohydrates  from his diet including Cakes, Sweet Desserts, Ice Cream, Soda (diet and regular), Sweet Tea, Candies, Chips, Cookies, Store Bought Juices, Alcohol , Artificial Sweeteners,  Coffee Creamer, and "Sugar-free" Products, Lemonade. This will help patient to have more stable blood glucose profile and potentially avoid unintended weight gain.  The following Lifestyle Medicine recommendations according to [place]  [facility]) were discussed and and offered to patient and he  agrees to start the journey:  A. Whole Foods, Plant-Based Nutrition comprising of  fruits and vegetables, plant-based proteins, whole-grain carbohydrates was discussed in detail with the patient.   A list for source of those nutrients were also provided to the patient.  Patient will use only water or unsweetened tea for hydration. B.  The need to stay away from risky substances including alcohol, smoking; obtaining 7 to 9 hours of restorative sleep, at least 150 minutes of moderate intensity exercise weekly, the importance of healthy social connections,  and stress management techniques were discussed. C.  A full color page of  Calorie density of various food groups per pound showing examples of each food groups was provided to the patient.    - Patient is advised to stick to a routine mealtimes to eat 3 meals  a day and avoid unnecessary snacks ( to snack only to correct hypoglycemia).  - I have approached patient with the following individualized plan to manage diabetes and patient agrees.  -He continues to struggle to control his glycemia despite high-dose of U500 insulin 3 times daily AC.      He is advised to increase his Humulin RU 5 0 0 to 80 units with breakfast, 18 units with lunch, and 80 units with supper  for pre-meal blood glucose readings of 90 mg/dL or above.   -I have advised him on how he can rotate insulin injection sites on his abdominal skin. -He is warned not not to take insulin without proper monitoring of blood glucose.  He is advised to  call clinic if he registers blood glucose less than 70 mg/dL or greater than 200 mg/dL.  He will continue to benefit from  metformin-advised to continue metformin  1000 mg p.o. daily at breakfast . -He will benefit from low-dose SGLT2 inhibitors.  I discussed and added Farxiga 5 mg p.o. daily at breakfast.  Side effects and precautions discussed with him.  - Patient specific target  for A1c; LDL, HDL, Triglycerides, and  Waist Circumference were discussed in detail.  2) BP/HTN: -His blood pressure is controlled to  target.   This medication was stopped and he is currently on losartan 25 mg p.o. daily at breakfast.  3) Lipids/HPL: His lipid panel shows uncontrolled LDL at 89.    He is advised to continue atorvastatin 80 mg p.o. nightly.  Whole food plant-based diet will help with his lipid panel.  Side effects and precautions discussed with him.   4)  Weight/Diet: His BMI is 71.85--BMZTAEW complicating his diabetes care.  Marland Kitchen  Has no success for weight control mainly due to his dietary indiscretion.  He has consulted with CDE.  He is advised on weight loss surgery.  See above.  5) hypothyroidism- His recent  thyroid function tests are consistent with appropriate replacement.  He is advised to continue levothyroxine 75 mcg p.o. daily before breakfast    - We discussed about the correct intake of his thyroid hormone, on empty stomach at fasting, with water, separated by at least 30 minutes from breakfast and other medications,  and separated by more than 4 hours from calcium, iron, multivitamins, acid reflux medications (PPIs). -Patient is made aware of the fact that thyroid hormone replacement is needed for life, dose to be adjusted by periodic monitoring of thyroid function tests.   6) Chronic Care/Health Maintenance:  -Patient  is  on ACEI/ARB and Statin medications and encouraged to continue to follow up with Ophthalmology, urologist given his recent recurrent CVA, podiatrist at least yearly or according to recommendations, and advised to  stay away from smoking. I have recommended yearly flu vaccine and pneumonia vaccination at least every 5 years; moderate intensity exercise for up to 150 minutes weekly; and  sleep for at least 7 hours a day.  POCT ABI was normal on April 09, 2020, next study will be repeated in January 2027, or sooner if needed.    - I advised patient to maintain close follow up with Burdine, Virgina Evener, MD for primary care needs.   I spent 41 minutes in the care of the patient  today including review of labs from [place], Lipids, Thyroid Function, Hematology (current and previous including abstractions from other facilities); face-to-face time discussing  his blood glucose readings/logs, discussing hypoglycemia and hyperglycemia episodes and symptoms, medications doses, his options of short and long term treatment based on the latest standards of care / guidelines;  discussion about incorporating lifestyle medicine;  and documenting the encounter. Risk reduction counseling performed per USPSTF guidelines to reduce obesity and cardiovascular risk factors.     Please refer to Patient Instructions for Blood Glucose Monitoring and Insulin/Medications Dosing Guide"  in media tab for additional information. Please  also refer to " Patient Self Inventory" in the Media  tab for reviewed elements of pertinent patient history.  [place] Staff [place] participated in the discussions, expressed understanding, and voiced agreement with the above plans.  All questions were answered to his satisfaction. he is encouraged to contact clinic should he have any questions or concerns prior to his return visit.   Follow up plan: -Return in about 3 months (around 12/19/2021) for Bring Meter/CGM Device/Logs- A1c in Office.  Glade Lloyd, MD Phone: [phone]  Fax: [phone]  -  This note was partially dictated with voice recognition software. Similar sounding words can be transcribed inadequately or may not  be corrected upon review.  09/18/2021, 1:38 PM

## 2021-09-18 NOTE — Patient Instructions (Signed)

## 2021-09-24 DIAGNOSIS — I83009 Varicose veins of unspecified lower extremity with ulcer of unspecified site: Secondary | ICD-10-CM | POA: Diagnosis not present

## 2021-09-24 DIAGNOSIS — I872 Venous insufficiency (chronic) (peripheral): Secondary | ICD-10-CM | POA: Diagnosis not present

## 2021-09-30 ENCOUNTER — Other Ambulatory Visit: Payer: Self-pay | Admitting: Cardiology

## 2021-10-05 ENCOUNTER — Other Ambulatory Visit: Payer: Self-pay | Admitting: Cardiology

## 2021-10-23 DIAGNOSIS — E1165 Type 2 diabetes mellitus with hyperglycemia: Secondary | ICD-10-CM | POA: Diagnosis not present

## 2021-10-23 DIAGNOSIS — E1122 Type 2 diabetes mellitus with diabetic chronic kidney disease: Secondary | ICD-10-CM | POA: Diagnosis not present

## 2021-10-23 DIAGNOSIS — N183 Chronic kidney disease, stage 3 unspecified: Secondary | ICD-10-CM | POA: Diagnosis not present

## 2021-10-23 DIAGNOSIS — I10 Essential (primary) hypertension: Secondary | ICD-10-CM | POA: Diagnosis not present

## 2021-10-23 DIAGNOSIS — Z1329 Encounter for screening for other suspected endocrine disorder: Secondary | ICD-10-CM | POA: Diagnosis not present

## 2021-10-23 DIAGNOSIS — E11649 Type 2 diabetes mellitus with hypoglycemia without coma: Secondary | ICD-10-CM | POA: Diagnosis not present

## 2021-10-23 DIAGNOSIS — E7849 Other hyperlipidemia: Secondary | ICD-10-CM | POA: Diagnosis not present

## 2021-10-23 DIAGNOSIS — E039 Hypothyroidism, unspecified: Secondary | ICD-10-CM | POA: Diagnosis not present

## 2021-10-27 ENCOUNTER — Other Ambulatory Visit: Payer: Self-pay | Admitting: "Endocrinology

## 2021-10-28 DIAGNOSIS — E039 Hypothyroidism, unspecified: Secondary | ICD-10-CM | POA: Diagnosis not present

## 2021-10-28 DIAGNOSIS — E7849 Other hyperlipidemia: Secondary | ICD-10-CM | POA: Diagnosis not present

## 2021-10-28 DIAGNOSIS — J329 Chronic sinusitis, unspecified: Secondary | ICD-10-CM | POA: Diagnosis not present

## 2021-10-28 DIAGNOSIS — I10 Essential (primary) hypertension: Secondary | ICD-10-CM | POA: Diagnosis not present

## 2021-10-28 DIAGNOSIS — G4733 Obstructive sleep apnea (adult) (pediatric): Secondary | ICD-10-CM | POA: Diagnosis not present

## 2021-10-28 DIAGNOSIS — E1122 Type 2 diabetes mellitus with diabetic chronic kidney disease: Secondary | ICD-10-CM | POA: Diagnosis not present

## 2021-10-28 DIAGNOSIS — D126 Benign neoplasm of colon, unspecified: Secondary | ICD-10-CM | POA: Diagnosis not present

## 2021-10-28 DIAGNOSIS — Z6837 Body mass index (BMI) 37.0-37.9, adult: Secondary | ICD-10-CM | POA: Diagnosis not present

## 2021-10-28 DIAGNOSIS — N183 Chronic kidney disease, stage 3 unspecified: Secondary | ICD-10-CM | POA: Diagnosis not present

## 2021-10-28 DIAGNOSIS — Z955 Presence of coronary angioplasty implant and graft: Secondary | ICD-10-CM | POA: Diagnosis not present

## 2021-10-28 NOTE — Telephone Encounter (Signed)
Pt's last visit states Humulin R 500 80 units at breakfast, 18 units at lunch and 80 units at supper. His previous dose was 70 units tid before meals. Wanted to clarify dosing.

## 2021-11-19 DIAGNOSIS — I872 Venous insufficiency (chronic) (peripheral): Secondary | ICD-10-CM | POA: Diagnosis not present

## 2021-11-27 ENCOUNTER — Telehealth: Payer: Self-pay | Admitting: *Deleted

## 2021-11-27 NOTE — Patient Outreach (Signed)
  Care Coordination   11/27/2021 Name: COTY LARSH MRN: [id number] DOB: August 21, 1950   Care Coordination Outreach Attempts:  An unsuccessful telephone outreach was attempted today to offer the patient information about available care coordination services as a benefit of their health plan.   Follow Up Plan:  Additional outreach attempts will be made to offer the patient care coordination information and services.   Encounter Outcome:  No Answer  Care Coordination Interventions Activated:  No   Care Coordination Interventions:  No, not indicated    Valente David, RN, MSN, [facility] Care Management Care Management Coordinator [phone]

## 2021-12-01 ENCOUNTER — Telehealth: Payer: Self-pay | Admitting: *Deleted

## 2021-12-01 ENCOUNTER — Encounter: Payer: Self-pay | Admitting: *Deleted

## 2021-12-01 NOTE — Patient Outreach (Signed)
  Care Coordination   Initial Visit Note   12/01/2021 Name: Alex Cortez MRN: [id number] DOB: February 26, 1951  Alex Cortez is a 71 y.o. year old male who sees Burdine, Virgina Evener, MD for primary care. I spoke with  Alex Cortez by phone today.  What matters to the patients health and wellness today?  Doing what the doctor's say and staying healthy.    Report he had fall yesterday, will seek medical attention if soreness does not subside.  AWV done in May this year, will contact PCP office to notify them of fall.  Denies any urgent concerns, encouraged to contact this care manager with questions.      Goals Addressed               This Visit's Progress     DM management (pt-stated)        Care Coordination Interventions: Provided education to patient about basic DM disease process Reviewed medications with patient and discussed importance of medication adherence Counseled on importance of regular laboratory monitoring as prescribed Provided patient with written educational materials related to hypo and hyperglycemia and importance of correct treatment Reviewed scheduled/upcoming provider appointments including: 9/22 with cardiology and 10/16 with endocrinologist Assessed social determinant of health barriers         SDOH assessments and interventions completed:  Yes  SDOH Interventions Today    Flowsheet Row Most Recent Value  SDOH Interventions   Food Insecurity Interventions Intervention Not Indicated  Housing Interventions Intervention Not Indicated  Transportation Interventions Intervention Not Indicated  Utilities Interventions Intervention Not Indicated        Care Coordination Interventions Activated:  Yes  Care Coordination Interventions:  Yes, provided   Follow up plan: Follow up call scheduled for 10/24    Encounter Outcome:  Pt. Visit Completed   Valente David, RN, MSN, [place] Care Management Care Management Coordinator [phone]

## 2021-12-01 NOTE — Patient Instructions (Signed)
Visit Information  Thank you for taking time to visit with me today. Please don't hesitate to contact me if I can be of assistance to you before our next scheduled telephone appointment.  Following are the goals we discussed today:  Monitor blood sugar and weights at least daily. Attend appointments with cardiology on 9/22 and endocrinology on 10/16  Our next appointment is by telephone on 10/24  Please call the care guide team at [phone] if you need to cancel or reschedule your appointment.   Please call the Suicide and Crisis Lifeline: 988 call the [place] National Suicide Prevention Lifeline: (618)298-1003 or TTY: 3175724536 TTY (302)236-1431) to talk to a trained counselor call 1-800-273-TALK (toll free, 24 hour hotline) call the [facility]: [phone] call 911 if you are experiencing a Mental Health or [place] or need someone to talk to.  The patient verbalized understanding of instructions, educational materials, and care plan provided today and DECLINED offer to receive copy of patient instructions, educational materials, and care plan.   The patient has been provided with contact information for the care management team and has been advised to call with any health related questions or concerns.   Valente David, RN, MSN, [place] Care Management Care Management Coordinator [phone]

## 2021-12-04 ENCOUNTER — Ambulatory Visit: Payer: Medicare Other | Attending: Cardiology | Admitting: Cardiology

## 2021-12-04 ENCOUNTER — Encounter: Payer: Self-pay | Admitting: Cardiology

## 2021-12-04 VITALS — BP 126/70 | HR 80 | Ht 75.0 in | Wt 278.4 lb

## 2021-12-04 DIAGNOSIS — E782 Mixed hyperlipidemia: Secondary | ICD-10-CM | POA: Diagnosis not present

## 2021-12-04 DIAGNOSIS — I10 Essential (primary) hypertension: Secondary | ICD-10-CM | POA: Diagnosis not present

## 2021-12-04 DIAGNOSIS — I2510 Atherosclerotic heart disease of native coronary artery without angina pectoris: Secondary | ICD-10-CM | POA: Diagnosis not present

## 2021-12-04 MED ORDER — EZETIMIBE 10 MG PO TABS: 10.0000 mg | ORAL_TABLET | Freq: Every day | ORAL | 6 refills | Status: DC

## 2021-12-04 MED ORDER — CARVEDILOL 25 MG PO TABS: 25.0000 mg | ORAL_TABLET | Freq: Two times a day (BID) | ORAL | 3 refills | Status: DC

## 2021-12-04 NOTE — Progress Notes (Signed)
Clinical Summary [name] is a 71 y.o.male seen today for follow up of the following medical problems.      1. CAD - prior stent to LAD in June 2011 - cath 2013 with patent stent and vessels 02/2018 echo: LVEF 60-65%, grade I dd - 06/2019 nuclear stress: inferior scar with mod peri-infarct ischemia vs artifact  - 10/2020 echo LVEF by pcp at [facility] with normal LVEF   -no chest pains, no SOB/DOE   2. HTN - compliant with meds     3. History of CVA - has been on plavix   4. DM2   5. NSVT -no palpitations.    6. Tobacco history - no cough, no wheezing - wants to think about breathing tests - 2017 255 lbs - he thinks he had PFTs with pcp   7. Severe OSA - 01/19/2021 abnoral sleep study, severe OSA - has not gotten cpap machine, currently hesitant to get.    8. Hyperlipidemia - 12/2020 TC 133 TG 104 HDL 26 LDL 87 - 08/2021 TC 145 TG 113 HDL 35 LDL 89 - he is on crestor '40mg'$  daily.      AAA screen: over 30, smoking history. [place] was negative Past Medical History:  Diagnosis Date   CAD (coronary artery disease)    nonobstructive CAD/patent LAD stent site; EF 65%, 08/2011; NSTEMI/DES LAD, 6/20 11   Chest pain    Diabetes mellitus    insulin-dependent   Dyslipidemia    HTN (hypertension)    Post MI syndrome ([place]) 08-29-2009   Stroke ([place]) 10/22/2012   Left inferior pons     Allergies  Allergen Reactions   Ace Inhibitors Swelling    Angioedema   Other Swelling    Shellfish  Swelling and hives     Current Outpatient Medications  Medication Sig Dispense Refill   amLODipine (NORVASC) 10 MG tablet Take 10 mg by mouth daily. for high blood pressure     carvedilol (COREG) 25 MG tablet Take 1 tablet (25 mg total) by mouth 2 (two) times daily. 180 tablet 1   clopidogrel (PLAVIX) 75 MG tablet Take 1 tablet (75 mg total) by mouth daily. 30 tablet 5   Continuous Blood Gluc Receiver (FREESTYLE LIBRE 2 READER) DEVI As directed 1 each 0   Continuous Blood Gluc  Sensor (FREESTYLE LIBRE 2 SENSOR) MISC 1 Piece by Does not apply route every 14 (fourteen) days. 7 each 1   dapagliflozin propanediol (FARXIGA) 5 MG TABS tablet Take 1 tablet (5 mg total) by mouth daily before breakfast. 30 tablet 2   fluticasone (FLONASE) 50 MCG/ACT nasal spray Place 2 sprays into both nostrils daily.     insulin regular human CONCENTRATED (HUMULIN R U-500 KWIKPEN) 500 UNIT/ML KwikPen INJECT AS DIRECTED 70 UNITS WITH BREAKFAST, 70 UNITS WITH LUNCH, AND 70 UNITS WITH SUPPER FOR PRE-MEAL BLOOD GLUCOSE READINGS OF '90MG'$ /DL OR ABOVE 30 mL 1   isosorbide mononitrate (IMDUR) 30 MG 24 hr tablet TAKE 1 TABLET BY MOUTH EVERY DAY 90 tablet 1   levothyroxine (SYNTHROID) 75 MCG tablet TAKE 1 TABLET BY MOUTH EVERY DAY BEFORE BREAKFAST 90 tablet 0   losartan (COZAAR) 25 MG tablet Take 1 tablet (25 mg total) by mouth daily. 30 tablet 6   metFORMIN (GLUCOPHAGE) 1000 MG tablet Take 1,000 mg by mouth daily with breakfast.  3   nitroGLYCERIN (NITROSTAT) 0.4 MG SL tablet Place 1 tablet (0.4 mg total) under the tongue every 5 (five) minutes as  needed. 25 tablet 3   ONE TOUCH ULTRA TEST test strip USE TO TEST BLOOD SUGAR FOUR TIMES DAILY E11.65 150 each 5   rosuvastatin (CRESTOR) 40 MG tablet TAKE 1 TABLET BY MOUTH EVERY DAY 90 tablet 2   No current facility-administered medications for this visit.     Past Surgical History:  Procedure Laterality Date   COLONOSCOPY  08/2021   CORONARY ANGIOPLASTY WITH STENT PLACEMENT       Allergies  Allergen Reactions   Ace Inhibitors Swelling    Angioedema   Other Swelling    Shellfish  Swelling and hives      Family History  Problem Relation Age of Onset   Hypertension Other        in all family members     Social History [name] reports that he quit smoking about 33 years ago. His smoking use included cigarettes. He started smoking about 66 years ago. He has a 11.50 pack-year smoking history. He has never used smokeless tobacco. [name]  reports no history of alcohol use.   Review of Systems CONSTITUTIONAL: No weight loss, fever, chills, weakness or fatigue.  HEENT: Eyes: No visual loss, blurred vision, double vision or yellow sclerae.No hearing loss, sneezing, congestion, runny nose or sore throat.  SKIN: No rash or itching.  CARDIOVASCULAR: per hpi RESPIRATORY: No shortness of breath, cough or sputum.  GASTROINTESTINAL: No anorexia, nausea, vomiting or diarrhea. No abdominal pain or blood.  GENITOURINARY: No burning on urination, no polyuria NEUROLOGICAL: No headache, dizziness, syncope, paralysis, ataxia, numbness or tingling in the extremities. No change in bowel or bladder control.  MUSCULOSKELETAL: No muscle, back pain, joint pain or stiffness.  LYMPHATICS: No enlarged nodes. No history of splenectomy.  PSYCHIATRIC: No history of depression or anxiety.  ENDOCRINOLOGIC: No reports of sweating, cold or heat intolerance. No polyuria or polydipsia.  Marland Kitchen   Physical Examination Today's Vitals   12/04/21 0833  BP: 126/70  Pulse: 80  SpO2: 94%  Weight: 278 lb 6.4 oz (126.3 kg)  Height: '6\' 3"'$  (1.905 m)   Body mass index is 34.8 kg/m.  Gen: resting comfortably, no acute distress HEENT: no scleral icterus, pupils equal round and reactive, no palptable cervical adenopathy,  CV: RRR, no m/r/g, no jvd Resp: Clear to auscultation bilaterally GI: abdomen is soft, non-tender, non-distended, normal bowel sounds, no hepatosplenomegaly MSK: extremities are warm, no edema.  Skin: warm, no rash Neuro:  no focal deficits Psych: appropriate affect   Diagnostic Studies  Nuclear stress test 02/15/2018:   No diagnostic ST segment changes to indicate ischemia. Small, mild intensity, apical to basal inferior/inferolateral defect that is partially reversible. This is consistent with a mild ischemic territory versus variable diaphragmatic attenuation. This is an intermediate risk study. Nuclear stress EF: 44%. Visually, LVEF  appears preserved, suggest echocardiogram to confirm.   Echocardiogram 02/20/2018 (limited):   Left ventricle: The cavity size was normal. There was moderate   concentric hypertrophy. Systolic function was normal. The   estimated ejection fraction was in the range of 60% to 65%. LVEF   64.8% by GLS. Wall motion was normal; there were no regional wall   motion abnormalities. Doppler parameters are consistent with   abnormal left ventricular relaxation (grade 1 diastolic   dysfunction). Doppler parameters are consistent with high   ventricular filling pressure. - Aortic valve: Trileaflet; mildly thickened leaflets. - Mitral valve: Mild focal calcification of tip of anterior   leaflet. Mildly calcified annulus.   Cardiac catheterization 09/03/2011:  Hemodynamics:    RA: 13/11/9 RV: 34/7/9 PCWP: 11/9/8 PA:  28/13/19   Cardiac Output              Thermodilution: 6.0 with index of 2.5             Fick : 5.6 with index of 2.3   Arterial Sat: 94 PA Sat: 65.   LV pressure: 158/19/17 Aortic pressure: 153/90/115   Angiography    Left Main: The left main has minor luminal regularities but no significant stenosis.   Left anterior Descending: The LAD has diffuse mild right allergies. There is a stent in the mid LAD which is widely patent. The distal LAD has minor luminal irregularities. There is a 40-50% stenosis in the very distal aspect of the LAD.  The first diagonal Aysa Larivee is a fairly large [place]. There are minor little regular disease but no significant stenosis. The second diagonal artery is a moderate-sized vessel. There is a 50% focal stenosis.   Left Circumflex: The left circumflex artery is a moderate-sized vessel. There are minor luminal irregularities   Right Coronary Artery: The right coronary artery is large and dominant. There are diffuse irregularities throughout the RCA. There is a 40% stenosis in the distal RCA. This is followed by  a focal 50% stenosis just prior to  the bifurcation of 2 posterior lateral branches. This lesion does not appear to obstruct flow.   LV Gram: The left ventricular function is normal. Ejection fraction is roughly 65%.   Complications: No apparent complications Patient did tolerate procedure well.   Conclusions:   1. Mild to moderate coronary artery disease. The stent in the LAD is widely patent. There are no significant irregularities to explain his symptoms. 2. Normal left systolic function. Essentially normal right heart pressures. 3. Moderate to severe hypertension. I suspect that this is the cause of the shortness of breath. He will need better blood pressure control 4. Poorly controlled diabetes. His glucose here in the lab was 329. He's on oral hypoglycemics. He will likely need to be started on insulin. We will defer to his medical Dr.     10/2020 echo Summary    1. The left ventricle is normal in size with upper normal wall thickness.    2. The left ventricular systolic function is normal, LVEF is visually  estimated at > 55%.    3. The left atrium is mildly dilated in size.    4. The right ventricle is normal in size, with normal systolic function.    5. Mild aortic valve sclerosis without stenosis.      02/2021 [place] screen Summary:  Abdominal Aorta: No evidence of an abdominal aortic aneurysm was  visualized. The largest aortic measurement is 2.1 cm.  Stenosis:  Poor quality study. No obvious abnormality of the mid to distal abdominal  aorta.    Assessment and Plan  1. CAD - denies any symptoms - he is on plavix instead of ASA due to prior CVA - continue current meds - EKG SR, no ischemic changes   2. HTN -at goal, continue current meds  3. Hyperlipidemia -remains above goal, add zetia '10mg'$  to his crestor '40mg'$  daily.    F/u 6 months   Arnoldo Lenis, M.D.

## 2021-12-04 NOTE — Patient Instructions (Addendum)
Medication Instructions:  Begin Zetia 10mg daily  Continue all other medications.     Labwork: none  Testing/Procedures: none  Follow-Up: 6 months   Any Other Special Instructions Will Be Listed Below (If Applicable).   If you need a refill on your cardiac medications before your next appointment, please call your pharmacy.  

## 2021-12-11 ENCOUNTER — Other Ambulatory Visit: Payer: Self-pay | Admitting: *Deleted

## 2021-12-11 MED ORDER — LOSARTAN POTASSIUM 50 MG PO TABS: 50.0000 mg | ORAL_TABLET | Freq: Every day | ORAL | Status: DC

## 2021-12-13 ENCOUNTER — Other Ambulatory Visit: Payer: Self-pay | Admitting: "Endocrinology

## 2021-12-25 ENCOUNTER — Ambulatory Visit: Payer: Medicare Other | Admitting: "Endocrinology

## 2021-12-26 LAB — HEPATIC FUNCTION PANEL
ALT: 20 U/L (ref 10–40)
AST: 13 — AB (ref 14–40)
Alkaline Phosphatase: 52 (ref 25–125)
Bilirubin, Total: 0.7

## 2021-12-26 LAB — BASIC METABOLIC PANEL
BUN: 20 (ref 4–21)
CO2: 27 — AB (ref 13–22)
Chloride: 106 (ref 99–108)
Creatinine: 1.3 (ref 0.6–1.3)
Glucose: 110
Potassium: 4.3 mEq/L (ref 3.5–5.1)
Sodium: 141 (ref 137–147)

## 2021-12-26 LAB — COMPREHENSIVE METABOLIC PANEL
Albumin: 4 (ref 3.5–5.0)
Calcium: 9.6 (ref 8.7–10.7)
Globulin: 3.3

## 2021-12-26 LAB — TSH: TSH: 3.02 (ref 0.41–5.90)

## 2021-12-26 LAB — LIPID PANEL
Cholesterol: 135 (ref 0–200)
HDL: 35 (ref 35–70)
LDL Cholesterol: 82
Triglycerides: 88 (ref 40–160)

## 2021-12-26 LAB — HEMOGLOBIN A1C: Hemoglobin A1C: 8.4

## 2021-12-28 ENCOUNTER — Encounter: Payer: Self-pay | Admitting: "Endocrinology

## 2021-12-28 ENCOUNTER — Ambulatory Visit (INDEPENDENT_AMBULATORY_CARE_PROVIDER_SITE_OTHER): Payer: Medicare Other | Admitting: "Endocrinology

## 2021-12-28 VITALS — BP 134/76 | HR 76 | Ht 75.0 in | Wt 283.2 lb

## 2021-12-28 DIAGNOSIS — I2510 Atherosclerotic heart disease of native coronary artery without angina pectoris: Secondary | ICD-10-CM

## 2021-12-28 DIAGNOSIS — E039 Hypothyroidism, unspecified: Secondary | ICD-10-CM

## 2021-12-28 DIAGNOSIS — Z6835 Body mass index (BMI) 35.0-35.9, adult: Secondary | ICD-10-CM

## 2021-12-28 DIAGNOSIS — E66812 Obesity, class 2: Secondary | ICD-10-CM | POA: Insufficient documentation

## 2021-12-28 DIAGNOSIS — E1159 Type 2 diabetes mellitus with other circulatory complications: Secondary | ICD-10-CM | POA: Diagnosis not present

## 2021-12-28 DIAGNOSIS — E782 Mixed hyperlipidemia: Secondary | ICD-10-CM | POA: Diagnosis not present

## 2021-12-28 DIAGNOSIS — I10 Essential (primary) hypertension: Secondary | ICD-10-CM

## 2021-12-28 NOTE — Progress Notes (Signed)
12/28/2021  Endocrinology follow-up note   Subjective:    Patient ID: Alex Cortez, male    DOB: 1950/04/16, PCP Pleas Koch Virgina Evener, MD   Past Medical History:  Diagnosis Date   CAD (coronary artery disease)    nonobstructive CAD/patent LAD stent site; EF 65%, 08/2011; NSTEMI/DES LAD, 6/20 11   Chest pain    Diabetes mellitus    insulin-dependent   Dyslipidemia    HTN (hypertension)    Post MI syndrome ([place]) 08-29-2009   Stroke ([place]) 10/22/2012   Left inferior pons   Past Surgical History:  Procedure Laterality Date   COLONOSCOPY  08/2021   CORONARY ANGIOPLASTY WITH STENT PLACEMENT     Social History   Socioeconomic History   Marital status: Married    Spouse name: Not on file   Number of children: Not on file   Years of education: Not on file   Highest education level: Not on file  Occupational History   Not on file  Tobacco Use   Smoking status: Former    Packs/day: 0.50    Years: 23.00    Total pack years: 11.50    Types: Cigarettes    Start date: 03/16/1955    Quit date: 03/15/1988    Years since quitting: 33.8   Smokeless tobacco: Never  Vaping Use   Vaping Use: Never used  Substance and Sexual Activity   Alcohol use: No    Alcohol/week: 0.0 standard drinks of alcohol   Drug use: No   Sexual activity: Not on file  Other Topics Concern   Not on file  Social History Narrative   Works at [employer]. Married.    Social Determinants of Health   Financial Resource Strain: Not on file  Food Insecurity: Unknown (12/01/2021)   Hunger Vital Sign    Worried About Running Out of Food in the Last Year: Not on file    Ran Out of Food in the Last Year: Never true  Transportation Needs: No Transportation Needs (12/01/2021)   PRAPARE - Hydrologist (Medical): No    Lack of Transportation (Non-Medical): No  Physical Activity: Not on file  Stress: Not on file  Social Connections: Unknown (07/31/2017)   Social Connection and Isolation  Panel [NHANES]    Frequency of Communication with Friends and Family: Patient refused    Frequency of Social Gatherings with Friends and Family: Patient refused    Attends Religious Services: Patient refused    Active Member of Clubs or Organizations: Patient refused    Attends Archivist Meetings: Patient refused    Marital Status: Patient refused   Outpatient Encounter Medications as of 12/28/2021  Medication Sig   amLODipine (NORVASC) 10 MG tablet Take 10 mg by mouth daily. for high blood pressure   carvedilol (COREG) 25 MG tablet Take 1 tablet (25 mg total) by mouth 2 (two) times daily.   clopidogrel (PLAVIX) 75 MG tablet Take 1 tablet (75 mg total) by mouth daily.   Continuous Blood Gluc Receiver (FREESTYLE LIBRE 2 READER) DEVI As directed   Continuous Blood Gluc Sensor (FREESTYLE LIBRE 2 SENSOR) MISC 1 Piece by Does not apply route every 14 (fourteen) days.   ezetimibe (ZETIA) 10 MG tablet Take 1 tablet (10 mg total) by mouth daily.   FARXIGA 5 MG TABS tablet TAKE 1 TABLET BY MOUTH DAILY BEFORE BREAKFAST.   fluticasone (FLONASE) 50 MCG/ACT nasal spray Place 2 sprays into both nostrils daily.   insulin  regular human CONCENTRATED (HUMULIN R U-500 KWIKPEN) 500 UNIT/ML KwikPen INJECT AS DIRECTED 70 UNITS WITH BREAKFAST, 70 UNITS WITH LUNCH, AND 70 UNITS WITH SUPPER FOR PRE-MEAL BLOOD GLUCOSE READINGS OF '90MG'$ /DL OR ABOVE (Patient taking differently: 80 Units 3 (three) times daily with meals. INJECT AS DIRECTED 70 UNITS WITH BREAKFAST, 70 UNITS WITH LUNCH, AND 70 UNITS WITH SUPPER FOR PRE-MEAL BLOOD GLUCOSE READINGS OF '90MG'$ /DL OR ABOVE)   isosorbide mononitrate (IMDUR) 30 MG 24 hr tablet TAKE 1 TABLET BY MOUTH EVERY DAY   levothyroxine (SYNTHROID) 75 MCG tablet TAKE 1 TABLET BY MOUTH EVERY DAY BEFORE BREAKFAST   losartan (COZAAR) 50 MG tablet Take 1 tablet (50 mg total) by mouth daily.   metFORMIN (GLUCOPHAGE) 1000 MG tablet Take 1,000 mg by mouth daily with breakfast.    nitroGLYCERIN (NITROSTAT) 0.4 MG SL tablet Place 1 tablet (0.4 mg total) under the tongue every 5 (five) minutes as needed.   ONE TOUCH ULTRA TEST test strip USE TO TEST BLOOD SUGAR FOUR TIMES DAILY E11.65   rosuvastatin (CRESTOR) 40 MG tablet TAKE 1 TABLET BY MOUTH EVERY DAY   No facility-administered encounter medications on file as of 12/28/2021.   ALLERGIES: Allergies  Allergen Reactions   Ace Inhibitors Swelling    Angioedema   Other Swelling    Shellfish  Swelling and hives   VACCINATION STATUS: Immunization History  Administered Date(s) Administered   Moderna Sars-Covid-2 Vaccination 04/19/2019, 05/17/2019     Diabetes He presents for his follow-up diabetic visit. He has type 2 diabetes mellitus. Onset time: He was diagnosed at approximate age of 9 years. His disease course has been improving. There are no hypoglycemic associated symptoms. Pertinent negatives for hypoglycemia include no confusion, headaches, pallor or seizures. Pertinent negatives for diabetes include no chest pain, no fatigue, no polydipsia, no polyphagia, no polyuria and no weakness. There are no hypoglycemic complications. Symptoms are improving. Diabetic complications include a CVA, heart disease and nephropathy. Risk factors for coronary artery disease include diabetes mellitus, dyslipidemia, hypertension, male sex, obesity, sedentary lifestyle and tobacco exposure. Current diabetic treatment includes intensive insulin program. He is compliant with treatment most of the time. His weight is increasing steadily. He is following a generally unhealthy (He consumes a large quantity of soda.) diet. When asked about meal planning, he reported none. He has had a previous visit with a dietitian. He never participates in exercise. His home blood glucose trend is decreasing steadily. His breakfast blood glucose range is generally 140-180 mg/dl. His lunch blood glucose range is generally 140-180 mg/dl. His dinner blood  glucose range is generally 140-180 mg/dl. His bedtime blood glucose range is generally 140-180 mg/dl. His overall blood glucose range is 140-180 mg/dl. (Mr. Tippy presents with his CGM device.  His device was downloaded and analyzed.  Over the last 14 days, he has 63% time in range, 21% level 1 hyperglycemia, 4% average 2 hyperglycemia.  His previsit labs show A1c of 8.4%, improving from 9.2%.  He did not have significant hypoglycemia.  His recent average blood glucose is 167 mg per DL for the last 14 days.     ) An ACE inhibitor/angiotensin II receptor blocker is being taken. Eye exam is current.  Hyperlipidemia This is a chronic problem. The current episode started more than 1 year ago. The problem is controlled. Exacerbating diseases include chronic renal disease, diabetes and obesity. Pertinent negatives include no chest pain, leg pain, myalgias or shortness of breath. Current antihyperlipidemic treatment includes statins. Risk factors for coronary  artery disease include diabetes mellitus, dyslipidemia, hypertension, male sex, obesity and a sedentary lifestyle.  Hypertension This is a chronic problem. The current episode started more than 1 year ago. The problem is controlled. Pertinent negatives include no chest pain, headaches, neck pain, palpitations or shortness of breath. Risk factors for coronary artery disease include obesity, male gender, dyslipidemia, diabetes mellitus, sedentary lifestyle and family history. Past treatments include ACE inhibitors. Hypertensive end-organ damage includes kidney disease and CVA. Identifiable causes of hypertension include chronic renal disease.     Review of systems  Constitutional: + Gaining weight,  current  Body mass index is 35.4 kg/m. , no fatigue, no subjective hyperthermia, no subjective hypothermia    Objective:    BP 134/76   Pulse 76   Ht '6\' 3"'$  (1.905 m)   Wt 283 lb 3.2 oz (128.5 kg)   BMI 35.40 kg/m   Wt Readings from Last 3  Encounters:  12/28/21 283 lb 3.2 oz (128.5 kg)  12/04/21 278 lb 6.4 oz (126.3 kg)  09/18/21 278 lb (126.1 kg)      Physical Exam- Limited  Constitutional:  Body mass index is 35.4 kg/m. , not in acute distress, normal state of mind Eyes:  EOMI, no exophthalmos Neck: Supple  Respiratory: Adequate breathing efforts Musculoskeletal: no gross deformities, strength intact in all four extremities, no gross restriction of joint movements Skin:  no rashes, no hyperemia, + lipodystrophy on abdominal skin Neurological: no tremor with outstretched hands       Latest Ref Rng & Units 12/26/2021   12:00 AM 09/11/2021    8:10 AM 06/12/2021    7:57 AM  CMP  Glucose 70 - 99 mg/dL  51  249   BUN 4 - '21 20     24  17   '$ Creatinine 0.6 - 1.3 1.3     1.23  1.43   Sodium 137 - 147 141     142  139   Potassium 3.5 - 5.1 mEq/L 4.3     3.9  4.5   Chloride 99 - 108 106     104  104   CO2 13 - '22 27     17  24   '$ Calcium 8.7 - 10.7 9.6     9.4  9.3   Total Protein 6.0 - 8.5 g/dL  8.1    Total Bilirubin 0.0 - 1.2 mg/dL  0.3    Alkaline Phos 25 - 125 52     67    AST 14 - 40 13     16    ALT 10 - 40 U/L 20     18       This result is from an external source.     Recent Results (from the past 2160 hour(s))  Basic metabolic panel     Status: Abnormal   Collection Time: 12/26/21 12:00 AM  Result Value Ref Range   Glucose 110    BUN 20 4 - 21   CO2 27 (A) 13 - 22   Creatinine 1.3 0.6 - 1.3   Potassium 4.3 3.5 - 5.1 mEq/L   Sodium 141 137 - 147   Chloride 106 99 - 108  Comprehensive metabolic panel     Status: None   Collection Time: 12/26/21 12:00 AM  Result Value Ref Range   Globulin 3.30    Calcium 9.6 8.7 - 10.7   Albumin 4.0 3.5 - 5.0  Lipid panel     Status: None  Collection Time: 12/26/21 12:00 AM  Result Value Ref Range   Triglycerides 88 40 - 160   Cholesterol 135 0 - 200   HDL 35 35 - 70   LDL Cholesterol 82   Hepatic function panel     Status: Abnormal   Collection Time:  12/26/21 12:00 AM  Result Value Ref Range   Alkaline Phosphatase 52 25 - 125   ALT 20 10 - 40 U/L   AST 13 (A) 14 - 40   Bilirubin, Total 0.7   Hemoglobin A1c     Status: None   Collection Time: 12/26/21 12:00 AM  Result Value Ref Range   Hemoglobin A1C 8.40   TSH     Status: None   Collection Time: 12/26/21 12:00 AM  Result Value Ref Range   TSH 3.02 0.41 - 5.90   Lipid Panel     Component Value Date/Time   CHOL 135 12/26/2021 0000   CHOL 145 09/11/2021 0810   TRIG 88 12/26/2021 0000   HDL 35 12/26/2021 0000   HDL 35 (L) 09/11/2021 0810   CHOLHDL 4.1 09/11/2021 0810   CHOLHDL 4.5 08/01/2017 0243   VLDL 20 08/01/2017 0243   LDLCALC 82 12/26/2021 0000   LDLCALC 89 09/11/2021 0810   LABVLDL 21 09/11/2021 0810     Assessment & Plan:   1. Type 2 diabetes mellitus with vascular disease (HCC)  -His  diabetes is complicated by recurrent CVA , coronary artery disease, obesity/sedentary life and patient remains at extremely  high risk for more acute and chronic complications of diabetes which include CAD, CVA, CKD, retinopathy, and neuropathy. These are all discussed in detail with the patient.  Mr. Hasler presents with his CGM device.  His device was downloaded and analyzed.  Over the last 14 days, he has 63% time in range, 21% level 1 hyperglycemia, 4% average 2 hyperglycemia.  His previsit labs show A1c of 8.4%, improving from 9.2%.  He did not have significant hypoglycemia.  His recent average blood glucose is 167 mg per DL for the last 14 days.     - Glucose logs and insulin administration records pertaining to this visit,  to be scanned into patient's records.  Recent labs reviewed.  - I have re-counseled the patient on diet management and weight loss  by adopting a carbohydrate restricted / protein rich  Diet.  - he acknowledges that there is a room for improvement in his food and drink choices. - Suggestion is made for him to avoid simple carbohydrates  from his diet  including Cakes, Sweet Desserts, Ice Cream, Soda (diet and regular), Sweet Tea, Candies, Chips, Cookies, Store Bought Juices, Alcohol , Artificial Sweeteners,  Coffee Creamer, and "Sugar-free" Products, Lemonade. This will help patient to have more stable blood glucose profile and potentially avoid unintended weight gain.  The following Lifestyle Medicine recommendations according to [place]  [facility]) were discussed and and offered to patient and he  agrees to start the journey:  A. Whole Foods, Plant-Based Nutrition comprising of fruits and vegetables, plant-based proteins, whole-grain carbohydrates was discussed in detail with the patient.   A list for source of those nutrients were also provided to the patient.  Patient will use only water or unsweetened tea for hydration. B.  The need to stay away from risky substances including alcohol, smoking; obtaining 7 to 9 hours of restorative sleep, at least 150 minutes of moderate intensity exercise weekly, the importance of healthy social connections,  and stress management techniques were discussed. C.  A full color page of  Calorie density of various food groups per pound showing examples of each food groups was provided to the patient.   - Patient is advised to stick to a routine mealtimes to eat 3 meals  a day and avoid unnecessary snacks ( to snack only to correct hypoglycemia).  - I have approached patient with the following individualized plan to manage diabetes and patient agrees.     He presents with reasonable engagement and control of diabetes to target.   -He is advised to continue  Humulin R U 500  80 units with breakfast, 80 units with lunch, and 80 units with supper  for pre-meal blood glucose readings of 90 mg/dL or above.   -I have advised him on how he can rotate insulin injection sites on his abdominal skin. -He is warned not not to take insulin without proper monitoring of blood glucose.  He is advised  to  call clinic if he registers blood glucose less than 70 mg/dL or greater than 200 mg/dL.  He will continue to benefit from  metformin-advised to continue metformin  1000 mg p.o. daily at breakfast . -He he is advised to continue Farxiga 5 mg p.o. daily at breakfast.  side effects and precautions discussed with him.  - Patient specific target  for A1c; LDL, HDL, Triglycerides, and  Waist Circumference were discussed in detail.  2) BP/HTN: -His blood pressure is controlled to target.  This medication was stopped and he is currently on losartan 25 mg p.o. daily at breakfast.     3) Lipids/HPL: His lipid panel shows uncontrolled LDL at  82.4.   He is advised to continue atorvastatin 80 mg p.o. nightly.    Whole food plant-based diet will help with his lipid panel, however, patient did not engage optimally.  Side effects and precautions discussed with him.   4)  Weight/Diet: Comes with weight gain.  His BMI is 37.6--[id number] complicating his diabetes care.   Has no success for weight control mainly due to his dietary indiscretion, a good candidate for lifestyle medicine, however did not engage optimally.  He has consulted with CDE.   5) hypothyroidism- His recent  thyroid function tests are consistent with appropriate replacement.  He is advised to continue levothyroxine 75 mcg p.o. daily before breakfast    - We discussed about the correct intake of his thyroid hormone, on empty stomach at fasting, with water, separated by at least 30 minutes from breakfast and other medications,  and separated by more than 4 hours from calcium, iron, multivitamins, acid reflux medications (PPIs). -Patient is made aware of the fact that thyroid hormone replacement is needed for life, dose to be adjusted by periodic monitoring of thyroid function tests.   6) Chronic Care/Health Maintenance:  -Patient  is  on ACEI/ARB and Statin medications and encouraged to continue to follow up with Ophthalmology, urologist  given his recent recurrent CVA, podiatrist at least yearly or according to recommendations, and advised to  stay away from smoking. I have recommended yearly flu vaccine and pneumonia vaccination at least every 5 years; moderate intensity exercise for up to 150 minutes weekly; and  sleep for at least 7 hours a day.  POCT ABI was normal on April 09, 2020, next study will be repeated in January 2027, or sooner if needed.    - I advised patient to maintain close follow up with Burdine, Virgina Evener, MD for primary  care needs.   I spent 41 minutes in the care of the patient today including review of labs from [place], Lipids, Thyroid Function, Hematology (current and previous including abstractions from other facilities); face-to-face time discussing  his blood glucose readings/logs, discussing hypoglycemia and hyperglycemia episodes and symptoms, medications doses, his options of short and long term treatment based on the latest standards of care / guidelines;  discussion about incorporating lifestyle medicine;  and documenting the encounter. Risk reduction counseling performed per USPSTF guidelines to reduce  obesity and cardiovascular risk factors.     Please refer to Patient Instructions for Blood Glucose Monitoring and Insulin/Medications Dosing Guide"  in media tab for additional information. Please  also refer to " Patient Self Inventory" in the Media  tab for reviewed elements of pertinent patient history.  [facility] Staff Quiocho participated in the discussions, expressed understanding, and voiced agreement with the above plans.  All questions were answered to his satisfaction. he is encouraged to contact clinic should he have any questions or concerns prior to his return visit.   Follow up plan: -Return in about 4 months (around 04/30/2022) for Bring Meter/CGM Device/Logs- A1c in Office.  Glade Lloyd, MD Phone: [phone]  Fax: [phone]  -  This note was partially dictated with voice recognition  software. Similar sounding words can be transcribed inadequately or may not  be corrected upon review.  12/28/2021, 9:24 AM

## 2021-12-28 NOTE — Patient Instructions (Signed)

## 2021-12-31 DIAGNOSIS — I872 Venous insufficiency (chronic) (peripheral): Secondary | ICD-10-CM | POA: Diagnosis not present

## 2022-01-05 ENCOUNTER — Ambulatory Visit: Payer: Self-pay | Admitting: *Deleted

## 2022-01-05 NOTE — Patient Outreach (Signed)
  Care Coordination   Follow Up Visit Note   01/05/2022 Name: PADDY NEIS MRN: [id number] DOB: August 22, 1950  [name] is a 71 y.o. year old male who sees Burdine, Virgina Evener, MD for primary care. I spoke with  [name] by phone today.  What matters to the patients health and wellness today?  Continues to work on DM management.  Wakes up in the middle of the night with hypoglycemia in the 50-60 range and eats a snack, which increases his am readings.  Will try to eat a bedtime snack and see if that makes a difference.      Goals Addressed               This Visit's Progress     DM management (pt-stated)   On track     Care Coordination Interventions: Provided education to patient about basic DM disease process Reviewed medications with patient and discussed importance of medication adherence Provided patient with written educational materials related to hypo and hyperglycemia and importance of correct treatment Reviewed scheduled/upcoming provider appointments including: follow up with endocrinology in 4 months Advised patient, providing education and rationale, to check cbg 3-4 time a day and record, calling provider for findings outside established parameters Educated on importance of eating bedtime snack Glucose readings  reviewed (204 before food, 184 before lunch)         SDOH assessments and interventions completed:  No     Care Coordination Interventions Activated:  Yes  Care Coordination Interventions:  Yes, provided   Follow up plan: Follow up call scheduled for 11/21    Encounter Outcome:  Pt. Visit Completed   Valente David, RN, MSN, [place] [facility] Care Management Coordinator [phone]

## 2022-02-02 ENCOUNTER — Encounter: Payer: Self-pay | Admitting: *Deleted

## 2022-02-02 ENCOUNTER — Other Ambulatory Visit: Payer: Self-pay | Admitting: "Endocrinology

## 2022-02-03 ENCOUNTER — Other Ambulatory Visit: Payer: Self-pay | Admitting: "Endocrinology

## 2022-02-08 ENCOUNTER — Ambulatory Visit: Payer: Self-pay | Admitting: *Deleted

## 2022-02-08 NOTE — Patient Outreach (Signed)
  Care Coordination   02/08/2022 Name: Alex Cortez MRN: [id number] DOB: May 27, 1950   Care Coordination Outreach Attempts:  An unsuccessful telephone outreach was attempted for a scheduled appointment today.  Follow Up Plan:  Additional outreach attempts will be made to offer the patient care coordination information and services.   Encounter Outcome:  No Answer   Care Coordination Interventions:  No, not indicated    Chong Sicilian, BSN, RN-BC RN Care Coordinator [place]: [phone] Main #: [phone]

## 2022-02-13 ENCOUNTER — Other Ambulatory Visit: Payer: Self-pay

## 2022-02-13 ENCOUNTER — Emergency Department (HOSPITAL_COMMUNITY): Payer: Medicare Other

## 2022-02-13 ENCOUNTER — Encounter (HOSPITAL_COMMUNITY): Payer: Self-pay | Admitting: Emergency Medicine

## 2022-02-13 ENCOUNTER — Emergency Department (HOSPITAL_COMMUNITY)
Admission: EM | Admit: 2022-02-13 | Discharge: 2022-02-14 | Disposition: A | Discharge status: Home / Self Care | Payer: Medicare Other | Attending: Emergency Medicine | Admitting: Emergency Medicine

## 2022-02-13 DIAGNOSIS — R112 Nausea with vomiting, unspecified: Secondary | ICD-10-CM | POA: Diagnosis present

## 2022-02-13 DIAGNOSIS — R0602 Shortness of breath: Secondary | ICD-10-CM | POA: Insufficient documentation

## 2022-02-13 DIAGNOSIS — R609 Edema, unspecified: Secondary | ICD-10-CM | POA: Insufficient documentation

## 2022-02-13 DIAGNOSIS — N281 Cyst of kidney, acquired: Secondary | ICD-10-CM | POA: Diagnosis not present

## 2022-02-13 DIAGNOSIS — Z8673 Personal history of transient ischemic attack (TIA), and cerebral infarction without residual deficits: Secondary | ICD-10-CM | POA: Insufficient documentation

## 2022-02-13 DIAGNOSIS — I10 Essential (primary) hypertension: Secondary | ICD-10-CM | POA: Diagnosis not present

## 2022-02-13 DIAGNOSIS — R590 Localized enlarged lymph nodes: Secondary | ICD-10-CM

## 2022-02-13 DIAGNOSIS — K529 Noninfective gastroenteritis and colitis, unspecified: Secondary | ICD-10-CM | POA: Diagnosis not present

## 2022-02-13 DIAGNOSIS — I2510 Atherosclerotic heart disease of native coronary artery without angina pectoris: Secondary | ICD-10-CM | POA: Insufficient documentation

## 2022-02-13 DIAGNOSIS — R5383 Other fatigue: Secondary | ICD-10-CM | POA: Diagnosis not present

## 2022-02-13 DIAGNOSIS — E119 Type 2 diabetes mellitus without complications: Secondary | ICD-10-CM | POA: Diagnosis not present

## 2022-02-13 DIAGNOSIS — R531 Weakness: Secondary | ICD-10-CM | POA: Diagnosis not present

## 2022-02-13 DIAGNOSIS — K3189 Other diseases of stomach and duodenum: Secondary | ICD-10-CM | POA: Diagnosis not present

## 2022-02-13 DIAGNOSIS — R509 Fever, unspecified: Secondary | ICD-10-CM | POA: Diagnosis not present

## 2022-02-13 DIAGNOSIS — Z7984 Long term (current) use of oral hypoglycemic drugs: Secondary | ICD-10-CM | POA: Insufficient documentation

## 2022-02-13 DIAGNOSIS — R000 Tachycardia, unspecified: Secondary | ICD-10-CM | POA: Diagnosis not present

## 2022-02-13 DIAGNOSIS — J9811 Atelectasis: Secondary | ICD-10-CM | POA: Diagnosis not present

## 2022-02-13 DIAGNOSIS — Z79899 Other long term (current) drug therapy: Secondary | ICD-10-CM | POA: Insufficient documentation

## 2022-02-13 DIAGNOSIS — Z1152 Encounter for screening for COVID-19: Secondary | ICD-10-CM | POA: Diagnosis not present

## 2022-02-13 DIAGNOSIS — Z794 Long term (current) use of insulin: Secondary | ICD-10-CM | POA: Insufficient documentation

## 2022-02-13 DIAGNOSIS — W19XXXA Unspecified fall, initial encounter: Secondary | ICD-10-CM | POA: Diagnosis not present

## 2022-02-13 DIAGNOSIS — R110 Nausea: Secondary | ICD-10-CM | POA: Diagnosis not present

## 2022-02-13 LAB — CBC
HCT: 38.9 % — ABNORMAL LOW (ref 39.0–52.0)
Hemoglobin: 12.6 g/dL — ABNORMAL LOW (ref 13.0–17.0)
MCH: 25.9 pg — ABNORMAL LOW (ref 26.0–34.0)
MCHC: 32.4 g/dL (ref 30.0–36.0)
MCV: 80 fL (ref 80.0–100.0)
Platelets: 218 10*3/uL (ref 150–400)
RBC: 4.86 MIL/uL (ref 4.22–5.81)
RDW: 14.1 % (ref 11.5–15.5)
WBC: 12.9 10*3/uL — ABNORMAL HIGH (ref 4.0–10.5)
nRBC: 0 % (ref 0.0–0.2)

## 2022-02-13 LAB — COMPREHENSIVE METABOLIC PANEL
ALT: 25 U/L (ref 0–44)
AST: 25 U/L (ref 15–41)
Albumin: 3.5 g/dL (ref 3.5–5.0)
Alkaline Phosphatase: 48 U/L (ref 38–126)
Anion gap: 10 (ref 5–15)
BUN: 22 mg/dL (ref 8–23)
CO2: 21 mmol/L — ABNORMAL LOW (ref 22–32)
Calcium: 9 mg/dL (ref 8.9–10.3)
Chloride: 107 mmol/L (ref 98–111)
Creatinine, Ser: 1.5 mg/dL — ABNORMAL HIGH (ref 0.61–1.24)
GFR, Estimated: 49 mL/min — ABNORMAL LOW (ref >60)
Glucose, Bld: 243 mg/dL — ABNORMAL HIGH (ref 70–99)
Potassium: 4.4 mmol/L (ref 3.5–5.1)
Sodium: 138 mmol/L (ref 135–145)
Total Bilirubin: 0.9 mg/dL (ref 0.3–1.2)
Total Protein: 7.4 g/dL (ref 6.5–8.1)

## 2022-02-13 LAB — URINALYSIS, ROUTINE W REFLEX MICROSCOPIC
Bacteria, UA: NONE SEEN
Bilirubin Urine: NEGATIVE
Glucose, UA: >=500 mg/dL — AB
Hgb urine dipstick: NEGATIVE
Ketones, ur: NEGATIVE mg/dL
Nitrite: NEGATIVE
Protein, ur: 30 mg/dL — AB
Specific Gravity, Urine: 1.025 (ref 1.005–1.030)
pH: 5 (ref 5.0–8.0)

## 2022-02-13 LAB — RESP PANEL BY RT-PCR (FLU A&B, COVID) ARPGX2
Influenza A by PCR: NEGATIVE
Influenza B by PCR: NEGATIVE
SARS Coronavirus 2 by RT PCR: NEGATIVE

## 2022-02-13 LAB — TROPONIN I (HIGH SENSITIVITY): Troponin I (High Sensitivity): 9 ng/L (ref <18)

## 2022-02-13 LAB — LIPASE, BLOOD: Lipase: 31 U/L (ref 11–51)

## 2022-02-13 MED ORDER — ACETAMINOPHEN 500 MG PO TABS
1000.0000 mg | ORAL_TABLET | Freq: Once | ORAL | Status: AC
  Administered 2022-02-13: 1000 mg via ORAL
  Filled 2022-02-13: qty 2

## 2022-02-13 MED ORDER — IOHEXOL 300 MG/ML  SOLN
75.0000 mL | Freq: Once | INTRAMUSCULAR | Status: AC | PRN
  Administered 2022-02-13: 75 mL via INTRAVENOUS

## 2022-02-13 MED ORDER — LACTATED RINGERS IV BOLUS
500.0000 mL | Freq: Once | INTRAVENOUS | Status: AC
  Administered 2022-02-13: 500 mL via INTRAVENOUS

## 2022-02-13 MED ORDER — ONDANSETRON HCL 4 MG/2ML IJ SOLN
4.0000 mg | Freq: Once | INTRAMUSCULAR | Status: AC
  Administered 2022-02-13: 4 mg via INTRAVENOUS
  Filled 2022-02-13: qty 2

## 2022-02-13 NOTE — ED Notes (Signed)
Patient transported to CT 

## 2022-02-13 NOTE — ED Triage Notes (Signed)
Pt bib EMS after he fell at home. Denies any injury from fall but states he has been weak, having a fever, and vomiting since 2pm today. EMS states pt's temp was 102.3 with them. Pt also given '4mg'$  Zofran IV as well as 228m NS prior to arrival. CBG was 260 per EMS.

## 2022-02-13 NOTE — ED Notes (Signed)
Patient transported to X-ray 

## 2022-02-13 NOTE — ED Notes (Signed)
ED Provider at bedside. 

## 2022-02-13 NOTE — ED Provider Notes (Signed)
[facility] EMERGENCY DEPARTMENT Provider Note   CSN: [id number] Arrival date & time: 02/13/22  1905     History {Add pertinent medical, surgical, social history, OB history to HPI:1} Chief Complaint  Patient presents with   Emesis   Fever   Weakness    Alex Cortez is a 71 y.o. male.  71 year old male with a history of CAD status post PCI, HTN, HLD, DM 2, and CVA without residual deficits who presents to the emergency department with nausea and vomiting.  Patient states that this afternoon at 2 PM he started feeling hot.  Says that he started having vomiting that was yellow/green.  Reports that he went to the bathroom and while vomiting had a fall.  Denies any LOC or head strike but did feel dizzy before it.  Denies any diarrhea, frequency or urgency recently.  No abdominal surgeries.  No chest pain does have chronic shortness of breath.       Home Medications Prior to Admission medications   Medication Sig Start Date End Date Taking? Authorizing Provider  amLODipine (NORVASC) 10 MG tablet Take 10 mg by mouth daily. for high blood pressure 10/05/18   [provider]  carvedilol (COREG) 25 MG tablet Take 1 tablet (25 mg total) by mouth 2 (two) times daily. 12/04/21   Arnoldo Lenis, MD  clopidogrel (PLAVIX) 75 MG tablet Take 1 tablet (75 mg total) by mouth daily. 11/28/17   Frann Rider, NP  Continuous Blood Gluc Receiver (FREESTYLE LIBRE 2 READER) DEVI As directed 08/21/21   Cassandria Anger, MD  Continuous Blood Gluc Sensor (FREESTYLE LIBRE 2 SENSOR) MISC APPLY 1 SENSOR EVERY 14    DAYS 02/03/22   Cassandria Anger, MD  ezetimibe (ZETIA) 10 MG tablet Take 1 tablet (10 mg total) by mouth daily. 12/04/21   Arnoldo Lenis, MD  FARXIGA 5 MG TABS tablet TAKE 1 TABLET BY MOUTH DAILY BEFORE BREAKFAST. 12/15/21   Cassandria Anger, MD  fluticasone (FLONASE) 50 MCG/ACT nasal spray Place 2 sprays into both nostrils daily. 05/31/18   [provider]   insulin regular human CONCENTRATED (HUMULIN R U-500 KWIKPEN) 500 UNIT/ML KwikPen INJECT AS DIRECTED 70 UNITS WITH BREAKFAST, 70 UNITS WITH LUNCH, AND 70 UNITS WITH SUPPER FOR PRE-MEAL BLOOD GLUCOSE READINGS OF '90MG'$ /DL OR ABOVE Patient taking differently: 80 Units 3 (three) times daily with meals. INJECT AS DIRECTED 70 UNITS WITH BREAKFAST, 70 UNITS WITH LUNCH, AND 70 UNITS WITH SUPPER FOR PRE-MEAL BLOOD GLUCOSE READINGS OF '90MG'$ /DL OR ABOVE 10/28/21   Cassandria Anger, MD  isosorbide mononitrate (IMDUR) 30 MG 24 hr tablet TAKE 1 TABLET BY MOUTH EVERY DAY 10/01/21   Arnoldo Lenis, MD  levothyroxine (SYNTHROID) 75 MCG tablet TAKE 1 TABLET BY MOUTH EVERY DAY BEFORE BREAKFAST 02/02/22   Cassandria Anger, MD  losartan (COZAAR) 50 MG tablet Take 1 tablet (50 mg total) by mouth daily. 12/11/21   Arnoldo Lenis, MD  metFORMIN (GLUCOPHAGE) 1000 MG tablet Take 1,000 mg by mouth daily with breakfast. 07/21/14   [provider]  nitroGLYCERIN (NITROSTAT) 0.4 MG SL tablet Place 1 tablet (0.4 mg total) under the tongue every 5 (five) minutes as needed. 08/30/11   de Stanford Scotland, MD  ONE TOUCH ULTRA TEST test strip USE TO TEST BLOOD SUGAR FOUR TIMES DAILY E11.65 12/16/15   Cassandria Anger, MD  rosuvastatin (CRESTOR) 40 MG tablet TAKE 1 TABLET BY MOUTH EVERY DAY 10/06/21   Arnoldo Lenis, MD  Allergies    Ace inhibitors and Other    Review of Systems   Review of Systems  Physical Exam Updated Vital Signs BP (!) 142/60 (BP Location: Right Arm)   Pulse (!) 102   Temp (!) 101.3 F (38.5 C) (Oral)   Resp 18   Ht '6\' 3"'$  (1.905 m)   Wt 84.4 kg   SpO2 90%   BMI 23.25 kg/m  Physical Exam  ED Results / Procedures / Treatments   Labs (all labs ordered are listed, but only abnormal results are displayed) Labs Reviewed  LIPASE, BLOOD  COMPREHENSIVE METABOLIC PANEL  CBC  URINALYSIS, ROUTINE W REFLEX MICROSCOPIC    EKG None  Radiology No results  found.  Procedures Procedures  {Document cardiac monitor, telemetry assessment procedure when appropriate:1}  Medications Ordered in ED Medications - No data to display  ED Course/ Medical Decision Making/ A&P                           Medical Decision Making Amount and/or Complexity of Data Reviewed Labs: ordered. Radiology: ordered.  Risk OTC drugs. Prescription drug management.   ***  {Document critical care time when appropriate:1} {Document review of labs and clinical decision tools ie heart score, Chads2Vasc2 etc:1}  {Document your independent review of radiology images, and any outside records:1} {Document your discussion with family members, caretakers, and with consultants:1} {Document social determinants of health affecting pt's care:1} {Document your decision making why or why not admission, treatments were needed:1} Final Clinical Impression(s) / ED Diagnoses Final diagnoses:  None    Rx / DC Orders ED Discharge Orders     None

## 2022-02-14 LAB — TROPONIN I (HIGH SENSITIVITY): Troponin I (High Sensitivity): 16 ng/L (ref <18)

## 2022-02-14 MED ORDER — ONDANSETRON HCL 4 MG PO TABS: 4.0000 mg | ORAL_TABLET | Freq: Four times a day (QID) | ORAL | 0 refills | Status: DC

## 2022-02-14 NOTE — Discharge Instructions (Addendum)
Today you were seen in the emergency department for your fever and nausea and vomiting.  It is likely that this is from a viral infection.  Please take the Zofran as needed for nausea and vomiting and Tylenol as needed for your fever.  Your CT scan showed enlarged lymph nodes that are concerning for possible infection versus metastatic disease (cancer).  Please follow-up in 2 days with your primary doctor about this and oncology in 1 week.  Return immediately to the emergency department if you experience any of the following: Chest pain, shortness of breath, persistent high fevers, or any other concerning symptoms.

## 2022-02-14 NOTE — ED Notes (Signed)
Pt ambulated length of nurse's station. Pt's O2 sats maintained 95-96% the entire time. EDP made aware

## 2022-02-19 DIAGNOSIS — I10 Essential (primary) hypertension: Secondary | ICD-10-CM | POA: Diagnosis not present

## 2022-02-19 DIAGNOSIS — E7849 Other hyperlipidemia: Secondary | ICD-10-CM | POA: Diagnosis not present

## 2022-02-19 DIAGNOSIS — E1165 Type 2 diabetes mellitus with hyperglycemia: Secondary | ICD-10-CM | POA: Diagnosis not present

## 2022-02-19 DIAGNOSIS — R5383 Other fatigue: Secondary | ICD-10-CM | POA: Diagnosis not present

## 2022-02-19 DIAGNOSIS — I5032 Chronic diastolic (congestive) heart failure: Secondary | ICD-10-CM | POA: Diagnosis not present

## 2022-02-19 DIAGNOSIS — N183 Chronic kidney disease, stage 3 unspecified: Secondary | ICD-10-CM | POA: Diagnosis not present

## 2022-02-19 DIAGNOSIS — E782 Mixed hyperlipidemia: Secondary | ICD-10-CM | POA: Diagnosis not present

## 2022-02-19 DIAGNOSIS — E039 Hypothyroidism, unspecified: Secondary | ICD-10-CM | POA: Diagnosis not present

## 2022-02-22 ENCOUNTER — Telehealth: Payer: Self-pay | Admitting: *Deleted

## 2022-02-22 NOTE — Progress Notes (Signed)
  Care Coordination Note  02/22/2022 Name: Alex Cortez MRN: [id number] DOB: [birth year]  Alex Cortez is a 71 y.o. year old male who is a primary care patient of Burdine, Virgina Evener, MD and is actively engaged with the care management team. I reached out to Alex Cortez by phone today to assist with re-scheduling a follow up visit with the RN Case Manager  Follow up plan: Unsuccessful telephone outreach attempt made. A HIPAA compliant phone message was left for the patient providing contact information and requesting a return call.   [place]  Direct Dial: [phone]

## 2022-02-26 DIAGNOSIS — G4733 Obstructive sleep apnea (adult) (pediatric): Secondary | ICD-10-CM | POA: Diagnosis not present

## 2022-02-26 DIAGNOSIS — R0600 Dyspnea, unspecified: Secondary | ICD-10-CM | POA: Diagnosis not present

## 2022-02-26 DIAGNOSIS — J329 Chronic sinusitis, unspecified: Secondary | ICD-10-CM | POA: Diagnosis not present

## 2022-02-26 DIAGNOSIS — E1122 Type 2 diabetes mellitus with diabetic chronic kidney disease: Secondary | ICD-10-CM | POA: Diagnosis not present

## 2022-02-26 DIAGNOSIS — Z23 Encounter for immunization: Secondary | ICD-10-CM | POA: Diagnosis not present

## 2022-02-26 DIAGNOSIS — K760 Fatty (change of) liver, not elsewhere classified: Secondary | ICD-10-CM | POA: Diagnosis not present

## 2022-02-26 DIAGNOSIS — E7849 Other hyperlipidemia: Secondary | ICD-10-CM | POA: Diagnosis not present

## 2022-02-26 DIAGNOSIS — R7989 Other specified abnormal findings of blood chemistry: Secondary | ICD-10-CM | POA: Diagnosis not present

## 2022-02-26 DIAGNOSIS — R599 Enlarged lymph nodes, unspecified: Secondary | ICD-10-CM | POA: Diagnosis not present

## 2022-02-26 DIAGNOSIS — Z955 Presence of coronary angioplasty implant and graft: Secondary | ICD-10-CM | POA: Diagnosis not present

## 2022-02-26 DIAGNOSIS — I10 Essential (primary) hypertension: Secondary | ICD-10-CM | POA: Diagnosis not present

## 2022-02-26 DIAGNOSIS — E039 Hypothyroidism, unspecified: Secondary | ICD-10-CM | POA: Diagnosis not present

## 2022-03-03 DIAGNOSIS — R7989 Other specified abnormal findings of blood chemistry: Secondary | ICD-10-CM | POA: Diagnosis not present

## 2022-03-06 ENCOUNTER — Other Ambulatory Visit: Payer: Self-pay | Admitting: "Endocrinology

## 2022-03-10 ENCOUNTER — Other Ambulatory Visit: Payer: Self-pay | Admitting: "Endocrinology

## 2022-03-11 NOTE — Progress Notes (Signed)
  Care Coordination Note  03/11/2022 Name: [name] MRN: [id number] DOB: [birth year]  Alex Cortez is a 71 y.o. year old male who is a primary care patient of Burdine, Virgina Evener, MD and is actively engaged with the care management team. I reached out to Alex Cortez by phone today to assist with re-scheduling a follow up visit with the RN Case Manager  Follow up plan: Telephone appointment with care management team member scheduled for:03/17/22  [place]: [phone]

## 2022-03-15 NOTE — Progress Notes (Signed)
[place] [phone]   [phone]  INITIAL CONSULTATION:  Patient Care Team: Curlene Labrum, MD as PCP - General Branch, Alphonse Guild, MD as PCP - Cardiology (Cardiology) Cassandria Anger, MD as Consulting Physician (Endocrinology) Valente David, RN as [place] Management  CHIEF COMPLAINTS/PURPOSE OF CONSULTATION:  Lymphadenopathy  HISTORY OF PRESENTING ILLNESS:  Alex Cortez 71 y.o. male with medical history significant for CAD, MI s/p coronary stent placement, DM, HTN and stroke who presents to the diagnostic clinic for evaluation of multifocal lymphadenopathy. She is accompanied by his wife for this visit.   On review of the previous records, Alex Cortez presented to the ED on 02/13/2022 with nausea and vomiting. CT abdomen and pelvis with contrast was obtained that showed diffuse retroperitoneal, right iliac chain and bilateral inguinal lymphadenopathy.   On exam today, Alex Cortez reports energy levels are stable without fatigue. He is able to complete his ADLs on his own. He has a good appetite and denies any weight changes. He reports occasional episodes of nausea, mainly when he takes insulin without eating. He denies any vomiting episodes. His bowel habits are unchanged without any recurrent episodes of diarrhea or constipation. He denies easy bruising or signs of active bleeding. He reports worsening right leg edema without pain or redness. He has neuropathy involving his feet and fingertips that has been chronic in nature. He reports intermittent episodes of flushing. He denies fevers, chills, sweats, chest pain or cough. He has no other complaints. Rest of the 10 point ROS is below.   MEDICAL HISTORY:  Past Medical History:  Diagnosis Date   CAD (coronary artery disease)    nonobstructive CAD/patent LAD stent site; EF 65%, 08/2011; NSTEMI/DES LAD, 6/20 11   Chest pain    Diabetes mellitus     insulin-dependent   Dyslipidemia    HTN (hypertension)    Post MI syndrome ([place]) 08-29-2009   Stroke ([place]) 10/22/2012   Left inferior pons    SURGICAL HISTORY: Past Surgical History:  Procedure Laterality Date   COLONOSCOPY  08/2021   CORONARY ANGIOPLASTY WITH STENT PLACEMENT      SOCIAL HISTORY: Social History   Socioeconomic History   Marital status: Married    Spouse name: Not on file   Number of children: Not on file   Years of education: Not on file   Highest education level: Not on file  Occupational History   Not on file  Tobacco Use   Smoking status: Former    Packs/day: 0.50    Years: 23.00    Total pack years: 11.50    Types: Cigarettes    Start date: 03/16/1955    Quit date: 03/15/1988    Years since quitting: 34.0   Smokeless tobacco: Never  Vaping Use   Vaping Use: Never used  Substance and Sexual Activity   Alcohol use: No    Alcohol/week: 0.0 standard drinks of alcohol   Drug use: No   Sexual activity: Not on file  Other Topics Concern   Not on file  Social History Narrative   Works at [employer]. Married.    Social Determinants of Health   Financial Resource Strain: Not on file  Food Insecurity: Unknown (12/01/2021)   Hunger Vital Sign    Worried About Running Out of Food in the Last Year: Not on file    Ran Out of Food in the Last Year: Never true  Transportation Needs: No Transportation  Needs (12/01/2021)   PRAPARE - Hydrologist (Medical): No    Lack of Transportation (Non-Medical): No  Physical Activity: Not on file  Stress: Not on file  Social Connections: Unknown (07/31/2017)   Social Connection and Isolation Panel [NHANES]    Frequency of Communication with Friends and Family: Patient refused    Frequency of Social Gatherings with Friends and Family: Patient refused    Attends Religious Services: Patient refused    Active Member of Clubs or Organizations: Patient refused    Attends Archivist  Meetings: Patient refused    Marital Status: Patient refused  Intimate Partner Violence: Unknown (07/31/2017)   Humiliation, Afraid, Rape, and Kick questionnaire    Fear of Current or Ex-Partner: Patient refused    Emotionally Abused: Patient refused    Physically Abused: Patient refused    Sexually Abused: Patient refused    FAMILY HISTORY: Family History  Problem Relation Age of Onset   Colon cancer Mother    Hypertension Other        in all family members    ALLERGIES:  is allergic to ace inhibitors and other.  MEDICATIONS:  Current Outpatient Medications  Medication Sig Dispense Refill   amLODipine (NORVASC) 10 MG tablet Take 10 mg by mouth daily. for high blood pressure     carvedilol (COREG) 25 MG tablet Take 1 tablet (25 mg total) by mouth 2 (two) times daily. 180 tablet 3   clopidogrel (PLAVIX) 75 MG tablet Take 1 tablet (75 mg total) by mouth daily. 30 tablet 5   Continuous Blood Gluc Receiver (FREESTYLE LIBRE 2 READER) DEVI As directed 1 each 0   Continuous Blood Gluc Sensor (FREESTYLE LIBRE 2 SENSOR) MISC APPLY 1 SENSOR EVERY 14    DAYS 6 each 1   ezetimibe (ZETIA) 10 MG tablet Take 1 tablet (10 mg total) by mouth daily. 30 tablet 6   FARXIGA 5 MG TABS tablet TAKE 1 TABLET BY MOUTH EVERY DAY BEFORE BREAKFAST 30 tablet 2   fluticasone (FLONASE) 50 MCG/ACT nasal spray Place 2 sprays into both nostrils daily.     insulin regular human CONCENTRATED (HUMULIN R U-500 KWIKPEN) 500 UNIT/ML KwikPen INJECT AS DIRECTED 80 UNITS WITH BREAKFAST, 80 UNITS WITH LUNCH, AND 80 UNITS WITH SUPPER FOR PRE-MEAL BLOOD GLUCOSE READINGS OF 90MG/DL OR ABOVE 12 mL 0   isosorbide mononitrate (IMDUR) 30 MG 24 hr tablet TAKE 1 TABLET BY MOUTH EVERY DAY 90 tablet 1   levothyroxine (SYNTHROID) 75 MCG tablet TAKE 1 TABLET BY MOUTH EVERY DAY BEFORE BREAKFAST 90 tablet 0   losartan (COZAAR) 50 MG tablet Take 1 tablet (50 mg total) by mouth daily.     metFORMIN (GLUCOPHAGE) 1000 MG tablet Take 1,000 mg by  mouth daily with breakfast.  3   nitroGLYCERIN (NITROSTAT) 0.4 MG SL tablet Place 1 tablet (0.4 mg total) under the tongue every 5 (five) minutes as needed. 25 tablet 3   ondansetron (ZOFRAN) 4 MG tablet Take 1 tablet (4 mg total) by mouth every 6 (six) hours. 12 tablet 0   ONE TOUCH ULTRA TEST test strip USE TO TEST BLOOD SUGAR FOUR TIMES DAILY E11.65 150 each 5   rosuvastatin (CRESTOR) 40 MG tablet TAKE 1 TABLET BY MOUTH EVERY DAY 90 tablet 2   No current facility-administered medications for this visit.    REVIEW OF SYSTEMS:   Constitutional: ( - ) fevers, ( - )  chills , ( - ) night sweats Eyes: ( - )  blurriness of vision, ( - ) double vision, ( - ) watery eyes Ears, nose, mouth, throat, and face: ( - ) mucositis, ( - ) sore throat Respiratory: ( - ) cough, ( + ) dyspnea, ( - ) wheezes Cardiovascular: ( - ) palpitation, ( - ) chest discomfort, ( + ) lower extremity swelling Gastrointestinal:  ( + ) nausea, ( - ) heartburn, ( - ) change in bowel habits Skin: ( - ) abnormal skin rashes Lymphatics: ( - ) new lymphadenopathy, ( - ) easy bruising Neurological: ( - ) numbness, ( - ) tingling, ( - ) new weaknesses Behavioral/Psych: ( - ) mood change, ( - ) new changes  All other systems were reviewed with the patient and are negative.  PHYSICAL EXAMINATION: ECOG PERFORMANCE STATUS: 1 - Symptomatic but completely ambulatory  Vitals:   03/16/22 0925  BP: (!) 140/65  Pulse: 80  Resp: 16  Temp: (!) 97 F (36.1 C)  SpO2: 96%   Filed Weights   03/16/22 0925  Weight: 272 lb 12.8 oz (123.7 kg)    GENERAL: well appearing male in NAD  SKIN: skin color, texture, turgor are normal, no rashes or significant lesions EYES: conjunctiva are pink and non-injected, sclera clear OROPHARYNX: no exudate, no erythema; lips, buccal mucosa, and tongue normal  NECK: supple, non-tender LYMPH:  no palpable lymphadenopathy in the cervical, axillary, inguinal or supraclavicular lymph nodes.  LUNGS: clear  to auscultation and percussion with normal breathing effort HEART: regular rate & rhythm and no murmurs. Right lower leg edema.  ABDOMEN: soft, non-tender, non-distended, normal bowel sounds.  Musculoskeletal: no cyanosis of digits and no clubbing  PSYCH: alert & oriented x 3, fluent speech NEURO: no focal motor/sensory deficits  LABORATORY DATA:  I have reviewed the data as listed    Latest Ref Rng & Units 03/16/2022   11:23 AM 02/13/2022    7:41 PM 07/31/2017    6:01 AM  CBC  WBC 4.0 - 10.5 K/uL 4.8  12.9    Hemoglobin 13.0 - 17.0 g/dL 13.2  12.6  15.3   Hematocrit 39.0 - 52.0 % 40.9  38.9  45.0   Platelets 150 - 400 K/uL 242  218         Latest Ref Rng & Units 03/16/2022   11:23 AM 02/13/2022    7:41 PM 12/26/2021   12:00 AM  CMP  Glucose 70 - 99 mg/dL 205  243    BUN 8 - 23 mg/dL _0 Creatinine 0.61 - 1.24 mg/dL 1.23  1.50  1.3      Sodium 135 - 145 mmol/L 138  138  141      Potassium 3.5 - 5.1 mmol/L 4.9  4.4  4.3      Chloride 98 - 111 mmol/L 105  107  106      CO2 22 - 32 mmol/L _1 Calcium 8.9 - 10.3 mg/dL 9.7  9.0  9.6      Total Protein 6.5 - 8.1 g/dL 8.1  7.4    Total Bilirubin 0.3 - 1.2 mg/dL 0.8  0.9    Alkaline Phos 38 - 126 U/L 55  48  52      AST 15 - 41 U/L _2 ALT 0 - 44 U/L 15  25  20  This result is from an external source.     RADIOGRAPHIC STUDIES: I have personally reviewed the radiological images as listed and agreed with the findings in the report. No results found.  ASSESSMENT & PLAN Alex Cortez is a 72 y.o. male who presents to the diagnostic clinic for evaluation for lymphadenopathy seen on CT imaging from 02/13/2022. We reviewed the images in detail and discussed our recommended workup. This includes laboratory evaluation today followed by referral to general surgery for excisional biopsy. Based on tissue diagnosis, we can complete staging studies with CT versus PET scan.    #Lymphadenopathy: --Labs today to check CBC, CMP, LDH, flow cytometry, CRP, ESR and PSA levels.  --Need biopsy of one of the enlarged lymph nodes, most likely the inguinal lymph node will be most accessible. We will send referral to general surgery for excisional biopsy as one of the differentials includes lymphoma.  --Complete staging studies with PET versus CT chest once we have tissue diagnosis.  --RTC once workup is complete  #Right leg edema: --Likely secondary to large right inguinal and iliac chain lymphadenopathy --Obtained doppler US today and there is no evidence of DVT --Monitor for now until diagnosis is confirmed.   #Age-appropriate cancer screenings:  --Last colonoscopy in June 2023 with several polyps but no evidence of malignancy --We will check PSA level today.   Orders Placed This Encounter  Procedures   CBC with Differential ([facility] Only)    Standing Status:   Future    Number of Occurrences:   1    Standing Expiration Date:   03/16/2023   CMP ([facility] only)    Standing Status:   Future    Number of Occurrences:   1    Standing Expiration Date:   03/16/2023   Lactate dehydrogenase (LDH)    Standing Status:   Future    Number of Occurrences:   1    Standing Expiration Date:   03/15/2023   Flow Cytometry, Peripheral Blood (Oncology)    Standing Status:   Future    Number of Occurrences:   1    Standing Expiration Date:   03/16/2023   Prostate-Specific AG, Serum    Standing Status:   Future    Number of Occurrences:   1    Standing Expiration Date:   03/16/2023   Sedimentation rate    Standing Status:   Future    Number of Occurrences:   1    Standing Expiration Date:   03/15/2023   C-reactive protein    Standing Status:   Future    Number of Occurrences:   1    Standing Expiration Date:   03/15/2023    All questions were answered. The patient knows to call the clinic with any problems, questions or concerns.  I have spent a total of 60  minutes minutes of face-to-face and non-face-to-face time, preparing to see the patient, obtaining and/or reviewing separately obtained history, performing a medically appropriate examination, counseling and educating the patient, ordering tests/procedures, referring and communicating with other health care professionals, documenting clinical information in the electronic health record, and care coordination.   Dede Query, PA-C Department of Hematology/Oncology [place] at [facility] Phone: [phone]  Patient was seen with Dr. Lorenso Courier  I have read the above note and personally examined the patient. I agree with the assessment and plan as noted above.  Briefly Alex Cortez is a 72 year old male who presents for  evaluation of lymphadenopathy predominantly composed of diffuse retroperitoneal, right iliac chain and bilateral inguinal lymphadenopathy.  At this time there is no clear etiology for these findings.  Would recommend an excisional biopsy in order to rule out lymphoma.  Once excisional biopsy is obtained we will consider further imaging such as PET CT scan versus standard CT scan.  Additionally today we will order CBC, CMP, LDH, and flow cytometry to assess further.  The patient voiced understanding of the workup for lymphadenopathy and the possible etiologies.  Will plan to see him back pending the results of the excisional biopsy.   Ledell Peoples, MD Department of Hematology/Oncology [place] at [facility] Phone: [phone] Pager: [phone] Email: [email or web address]

## 2022-03-16 ENCOUNTER — Encounter: Payer: Self-pay | Admitting: Physician Assistant

## 2022-03-16 ENCOUNTER — Other Ambulatory Visit: Payer: Self-pay

## 2022-03-16 ENCOUNTER — Inpatient Hospital Stay: Payer: Medicare Other | Attending: Physician Assistant | Admitting: Physician Assistant

## 2022-03-16 ENCOUNTER — Ambulatory Visit (HOSPITAL_BASED_OUTPATIENT_CLINIC_OR_DEPARTMENT_OTHER)
Admission: RE | Admit: 2022-03-16 | Discharge: 2022-03-16 | Disposition: A | Discharge status: Home / Self Care | Payer: Medicare Other | Source: Ambulatory Visit | Attending: Physician Assistant | Admitting: Physician Assistant

## 2022-03-16 ENCOUNTER — Inpatient Hospital Stay: Payer: Medicare Other

## 2022-03-16 VITALS — BP 140/65 | HR 80 | Temp 97.0°F | Resp 16 | Wt 272.8 lb

## 2022-03-16 DIAGNOSIS — Z955 Presence of coronary angioplasty implant and graft: Secondary | ICD-10-CM | POA: Diagnosis not present

## 2022-03-16 DIAGNOSIS — Z7902 Long term (current) use of antithrombotics/antiplatelets: Secondary | ICD-10-CM | POA: Insufficient documentation

## 2022-03-16 DIAGNOSIS — Z8673 Personal history of transient ischemic attack (TIA), and cerebral infarction without residual deficits: Secondary | ICD-10-CM | POA: Insufficient documentation

## 2022-03-16 DIAGNOSIS — R935 Abnormal findings on diagnostic imaging of other abdominal regions, including retroperitoneum: Secondary | ICD-10-CM | POA: Insufficient documentation

## 2022-03-16 DIAGNOSIS — Z87891 Personal history of nicotine dependence: Secondary | ICD-10-CM | POA: Diagnosis not present

## 2022-03-16 DIAGNOSIS — E1140 Type 2 diabetes mellitus with diabetic neuropathy, unspecified: Secondary | ICD-10-CM | POA: Diagnosis not present

## 2022-03-16 DIAGNOSIS — Z79890 Hormone replacement therapy (postmenopausal): Secondary | ICD-10-CM | POA: Diagnosis not present

## 2022-03-16 DIAGNOSIS — R591 Generalized enlarged lymph nodes: Secondary | ICD-10-CM | POA: Insufficient documentation

## 2022-03-16 DIAGNOSIS — E785 Hyperlipidemia, unspecified: Secondary | ICD-10-CM | POA: Insufficient documentation

## 2022-03-16 DIAGNOSIS — I10 Essential (primary) hypertension: Secondary | ICD-10-CM | POA: Diagnosis not present

## 2022-03-16 DIAGNOSIS — Z7984 Long term (current) use of oral hypoglycemic drugs: Secondary | ICD-10-CM | POA: Insufficient documentation

## 2022-03-16 DIAGNOSIS — Z79899 Other long term (current) drug therapy: Secondary | ICD-10-CM | POA: Diagnosis not present

## 2022-03-16 DIAGNOSIS — I252 Old myocardial infarction: Secondary | ICD-10-CM | POA: Insufficient documentation

## 2022-03-16 DIAGNOSIS — Z125 Encounter for screening for malignant neoplasm of prostate: Secondary | ICD-10-CM | POA: Diagnosis not present

## 2022-03-16 DIAGNOSIS — I2510 Atherosclerotic heart disease of native coronary artery without angina pectoris: Secondary | ICD-10-CM | POA: Diagnosis not present

## 2022-03-16 DIAGNOSIS — R600 Localized edema: Secondary | ICD-10-CM

## 2022-03-16 LAB — CBC WITH DIFFERENTIAL (CANCER CENTER ONLY)
Abs Immature Granulocytes: 0.01 10*3/uL (ref 0.00–0.07)
Basophils Absolute: 0.1 10*3/uL (ref 0.0–0.1)
Basophils Relative: 1 %
Eosinophils Absolute: 0.2 10*3/uL (ref 0.0–0.5)
Eosinophils Relative: 4 %
HCT: 40.9 % (ref 39.0–52.0)
Hemoglobin: 13.2 g/dL (ref 13.0–17.0)
Immature Granulocytes: 0 %
Lymphocytes Relative: 34 %
Lymphs Abs: 1.6 10*3/uL (ref 0.7–4.0)
MCH: 26 pg (ref 26.0–34.0)
MCHC: 32.3 g/dL (ref 30.0–36.0)
MCV: 80.5 fL (ref 80.0–100.0)
Monocytes Absolute: 0.5 10*3/uL (ref 0.1–1.0)
Monocytes Relative: 11 %
Neutro Abs: 2.4 10*3/uL (ref 1.7–7.7)
Neutrophils Relative %: 50 %
Platelet Count: 242 10*3/uL (ref 150–400)
RBC: 5.08 MIL/uL (ref 4.22–5.81)
RDW: 13.7 % (ref 11.5–15.5)
WBC Count: 4.8 10*3/uL (ref 4.0–10.5)
nRBC: 0 % (ref 0.0–0.2)

## 2022-03-16 LAB — CMP (CANCER CENTER ONLY)
ALT: 15 U/L (ref 0–44)
AST: 14 U/L — ABNORMAL LOW (ref 15–41)
Albumin: 3.8 g/dL (ref 3.5–5.0)
Alkaline Phosphatase: 55 U/L (ref 38–126)
Anion gap: 5 (ref 5–15)
BUN: 16 mg/dL (ref 8–23)
CO2: 28 mmol/L (ref 22–32)
Calcium: 9.7 mg/dL (ref 8.9–10.3)
Chloride: 105 mmol/L (ref 98–111)
Creatinine: 1.23 mg/dL (ref 0.61–1.24)
GFR, Estimated: >60 mL/min (ref >60)
Glucose, Bld: 205 mg/dL — ABNORMAL HIGH (ref 70–99)
Potassium: 4.9 mmol/L (ref 3.5–5.1)
Sodium: 138 mmol/L (ref 135–145)
Total Bilirubin: 0.8 mg/dL (ref 0.3–1.2)
Total Protein: 8.1 g/dL (ref 6.5–8.1)

## 2022-03-16 LAB — LACTATE DEHYDROGENASE: LDH: 350 U/L — ABNORMAL HIGH (ref 98–192)

## 2022-03-16 LAB — SURGICAL PATHOLOGY

## 2022-03-16 LAB — SEDIMENTATION RATE: Sed Rate: 42 mm/hr — ABNORMAL HIGH (ref 0–16)

## 2022-03-16 LAB — C-REACTIVE PROTEIN: CRP: 2.9 mg/dL — ABNORMAL HIGH (ref <1.0)

## 2022-03-16 NOTE — Progress Notes (Signed)
Right lower extremity venous duplex has been completed. Preliminary results can be found in CV Proc through chart review.  Results were given to Dede Query PA.  03/16/22 12:43 PM Alex Cortez RVT

## 2022-03-17 ENCOUNTER — Encounter: Payer: Self-pay | Admitting: *Deleted

## 2022-03-17 ENCOUNTER — Ambulatory Visit: Payer: Self-pay | Admitting: *Deleted

## 2022-03-17 LAB — PROSTATE-SPECIFIC AG, SERUM (LABCORP): Prostate Specific Ag, Serum: 1.5 ng/mL (ref 0.0–4.0)

## 2022-03-17 NOTE — Patient Outreach (Signed)
  Care Coordination   Follow Up Visit Note   03/17/2022 Name: Alex Cortez MRN: [id number] DOB: 01-Aug-1950  Alex Cortez is a 72 y.o. year old male who sees Burdine, Virgina Evener, MD for primary care. I spoke with  Alex Cortez by phone today.  What matters to the patients health and wellness today?  Manage blood sugar, edema, and enlarged lymph nodes    Goals Addressed               This Visit's Progress     Patient Stated     DM management (pt-stated)        Care Coordination Interventions: Reviewed medications with patient and discussed importance of medication adherence Counseled on importance of regular laboratory monitoring as prescribed Discussed plans with patient for ongoing care management follow up and provided patient with direct contact information for care management team Reviewed scheduled/upcoming provider appointments including: follow up with endocrinology on 05/03/22 Advised patient, providing education and rationale, to check cbg 3-4 time a day and record, calling provider for findings outside established parameters Review of patient status, including review of consultants reports, relevant laboratory and other test results, and medications completed Assessed social determinant of health barriers Discussed home blood sugar readings and recent A1C result       Other     Manage Lymphadenopathy        Care Coordination Interventions: Evaluation of current treatment plan related to lymphadenopathy and patient's adherence to plan as established by provider Assessed social determinant of health barriers Assessed family/social support Reviewed recent lab, imaging reports, and office notes Discussed right lower leg edema and enlarged lymph nodes in abdomen Patient expects a call from oncology dept today regarding lab results and plan for biopsy of abd lymph nodes. Surgical excision vs aspiration. He would prefer to avoid surgery if possible but willing if it  is necessary. Provided with [facility] telephone number and encouraged to reach out as needed Scheduled f/u telephone call for 3 weeks         SDOH assessments and interventions completed:  Yes  SDOH Interventions Today    Flowsheet Row Most Recent Value  SDOH Interventions   Housing Interventions Intervention Not Indicated  Transportation Interventions Intervention Not Indicated  Financial Strain Interventions Intervention Not Indicated        Care Coordination Interventions:  Yes, provided   Follow up plan: Follow up call scheduled for 04/07/22    Encounter Outcome:  Pt. Visit Completed   Chong Sicilian, BSN, RN-BC RN Care Coordinator [place]: [phone] Main #: [phone]

## 2022-03-18 LAB — FLOW CYTOMETRY

## 2022-03-22 ENCOUNTER — Telehealth: Payer: Self-pay

## 2022-03-22 NOTE — Telephone Encounter (Signed)
Referral faxed to [facility].  Appt 03/30/22 with Dr Michaelle Birks

## 2022-03-23 ENCOUNTER — Other Ambulatory Visit: Payer: Self-pay | Admitting: *Deleted

## 2022-03-23 MED ORDER — HUMULIN R U-500 KWIKPEN 500 UNIT/ML ~~LOC~~ SOPN: PEN_INJECTOR | SUBCUTANEOUS | 0 refills | Status: DC

## 2022-03-23 NOTE — Telephone Encounter (Signed)
Patient called and states that he only got 2 pens of his Humulin R U. Normally he gets 5 pens sent to him. His copay is $40 if it is 2 pens , 5 pens or 6 pens. He is asking what can we do as it was not sent in correctly this time.  We did a new 90 prescription and put a note for the pharmacy to call the patient prior to dispensing.

## 2022-04-01 ENCOUNTER — Telehealth: Payer: Self-pay | Admitting: *Deleted

## 2022-04-01 ENCOUNTER — Ambulatory Visit: Payer: Self-pay | Admitting: Surgery

## 2022-04-01 DIAGNOSIS — R591 Generalized enlarged lymph nodes: Secondary | ICD-10-CM | POA: Diagnosis not present

## 2022-04-01 NOTE — H&P (Signed)
History of Present Illness: Alex Cortez is a 72 y.o. male who was referred to me for evaluation of an excisional lymph node biopsy.  He was seen in the ED in December with vomiting, and had a CT scan which showed diffuse retroperitoneal, iliac, and inguinal lymphadenopathy. He was seen by heme/onc on 03/16/22 and referred for an excisional lymph node biopsy due to concern for lymphoma. Today he denies any night sweats, fevers, or loss of appetite. He has not noticed any new lumps under his arms or in his groin. He has noticed swelling in the right leg.   He has a history of CAD and is on Plavix, and had a stent placed in 2011. His most recent echo in 2022 in [facility] system was reportedly normal. He had an NM stress test in 2021 showing previous infarct vs artifact. His cardiologist is Dr. Harl Bowie. Today the patient denies chest pain but reports dyspnea on exertion.     Review of Systems: A complete review of systems was obtained from the patient.  I have reviewed this information and discussed as appropriate with the patient.  See HPI as well for other ROS.       Medical History: Past Medical History      Past Medical History:  Diagnosis Date   Diabetes mellitus without complication (CMS-HCC)     History of stroke     Hyperlipidemia     Hypertension     Sleep apnea          There is no problem list on file for this patient.     Past Surgical History  History reviewed. No pertinent surgical history.      Allergies  No Known Allergies           Current Outpatient Medications on File Prior to Visit  Medication Sig Dispense Refill   amLODIPine (NORVASC) 10 MG tablet Take 10 mg by mouth once daily       carvediloL (COREG) 25 MG tablet Take 25 mg by mouth 2 (two) times daily       clopidogreL (PLAVIX) 75 mg tablet Take 75 mg by mouth once daily       ezetimibe (ZETIA) 10 mg tablet Take 1 tablet by mouth once daily       FARXIGA 5 mg Tab tablet Take 1 tablet by mouth every morning  before breakfast (0630)       fluticasone propionate (FLONASE) 50 mcg/actuation nasal spray Place 2 sprays into both nostrils once daily       HUMULIN R U-500, CONC, KWIKPEN 500 unit/mL (3 mL) pen injector INJECT AS DIRECTED 80 UNITS WITH BREAKFAST, 80 UNITS WITH LUNCH, AND 80 UNITS WITH SUPPER FOR PRE-MEAL BLOOD GLUCOSE READINGS OF '90MG'$ /DL OR ABOVE       isosorbide mononitrate (IMDUR) 30 MG ER tablet Take 1 tablet by mouth once daily       levothyroxine (SYNTHROID) 75 MCG tablet Take 1 tablet by mouth every morning before breakfast (0630)       losartan (COZAAR) 50 MG tablet Take 50 mg by mouth once daily       metFORMIN (GLUCOPHAGE) 1000 MG tablet TAKE 1 TABLET(S) ORAL TWO TIMES A DAY FOR DIABETES       rosuvastatin (CRESTOR) 40 MG tablet Take 40 mg by mouth once daily        No current facility-administered medications on file prior to visit.      Family History       Family  History  Problem Relation Age of Onset   Colon cancer Mother          Social History        Tobacco Use  Smoking Status Former   Types: Cigarettes   Quit date: 1991   Years since quitting: 33.0  Smokeless Tobacco Never      Social History  Social History         Socioeconomic History   Marital status: Married  Tobacco Use   Smoking status: Former      Types: Cigarettes      Quit date: 1991      Years since quitting: 33.0   Smokeless tobacco: Never  Vaping Use   Vaping Use: Never used  Substance and Sexual Activity   Alcohol use: Not Currently   Drug use: Never        Objective:         Vitals:    04/01/22 1326  BP: (!) 140/76  Pulse: 87  Temp: 37.1 C (98.7 F)  SpO2: 98%  Weight: (!) 126.4 kg (278 lb 9.6 oz)  Height: 190.5 cm ('6\' 3"'$ )  PainSc: 0-No pain    Body mass index is 34.82 kg/m.   Physical Exam Vitals reviewed.  Constitutional:      General: He is not in acute distress.    Appearance: Normal appearance.  HENT:     Head: Normocephalic and atraumatic.   Cardiovascular:     Rate and Rhythm: Normal rate and regular rhythm.  Pulmonary:     Effort: Pulmonary effort is normal. No respiratory distress.     Breath sounds: Normal breath sounds.  Lymphadenopathy:     Comments: Bilateral inguinal adenopathy, more prominent in the right groin.  Skin:    General: Skin is warm and dry.  Neurological:     General: No focal deficit present.     Mental Status: He is alert and oriented to person, place, and time.            Assessment and Plan:  Diagnoses and all orders for this visit:   Lymphadenopathy     Alex Cortez is a 72 yo male with lymphadenopathy, referred for excisional lymph node biopsy. I personally reviewed his imaging, referral notes, and labs. I reviewed the details of this procedure and the benefits and risks of bleeding, infection, and postoperative seroma. He expressed understanding and agrees to proceed with surgery. I recommend biopsy of one of the right inguinal nodes as they are easily palpable. Will request preoperative risk stratification from his cardiologist. If it is high risk for him to hold his Plavix perioperatively, he may continue taking it throughout the perioperative period, which I discussed with the patient today. He will be contacted to schedule a surgery date as soon as possible.  Michaelle Birks, [facility], Hepatobiliary and Pancreatic Surgery 04/01/22 2:07 PM

## 2022-04-01 NOTE — Telephone Encounter (Signed)
Pt has been scheduled for a tele visit, 04/06/21 3:00.  Consent on file / medications reconciled.

## 2022-04-01 NOTE — Telephone Encounter (Signed)
Pt has been scheduled for a tele visit, 04/06/21 3:00.  Consent on file / medications reconciled.    Patient Consent for Virtual Visit        Alex Cortez has provided verbal consent on 04/01/2022 for a virtual visit (video or telephone).   CONSENT FOR VIRTUAL VISIT FOR:  Alex Cortez  By participating in this virtual visit I agree to the following:  I hereby voluntarily request, consent and authorize [facility] and its employed or contracted physicians, physician assistants, nurse practitioners or other licensed health care professionals (the Practitioner), to provide me with telemedicine health care services (the "Services") as deemed necessary by the treating Practitioner. I acknowledge and consent to receive the Services by the Practitioner via telemedicine. I understand that the telemedicine visit will involve communicating with the Practitioner through live audiovisual communication technology and the disclosure of certain medical information by electronic transmission. I acknowledge that I have been given the opportunity to request an in-person assessment or other available alternative prior to the telemedicine visit and am voluntarily participating in the telemedicine visit.  I understand that I have the right to withhold or withdraw my consent to the use of telemedicine in the course of my care at any time, without affecting my right to future care or treatment, and that the Practitioner or I may terminate the telemedicine visit at any time. I understand that I have the right to inspect all information obtained and/or recorded in the course of the telemedicine visit and may receive copies of available information for a reasonable fee.  I understand that some of the potential risks of receiving the Services via telemedicine include:  Delay or interruption in medical evaluation due to technological equipment failure or disruption; Information transmitted may not be  sufficient (e.g. poor resolution of images) to allow for appropriate medical decision making by the Practitioner; and/or  In rare instances, security protocols could fail, causing a breach of personal health information.  Furthermore, I acknowledge that it is my responsibility to provide information about my medical history, conditions and care that is complete and accurate to the best of my ability. I acknowledge that Practitioner's advice, recommendations, and/or decision may be based on factors not within their control, such as incomplete or inaccurate data provided by me or distortions of diagnostic images or specimens that may result from electronic transmissions. I understand that the practice of medicine is not an exact science and that Practitioner makes no warranties or guarantees regarding treatment outcomes. I acknowledge that a copy of this consent can be made available to me via my patient portal ([facility]), or I can request a printed copy by calling the office of [facility].    I understand that my insurance will be billed for this visit.   I have read or had this consent read to me. I understand the contents of this consent, which adequately explains the benefits and risks of the Services being provided via telemedicine.  I have been provided ample opportunity to ask questions regarding this consent and the Services and have had my questions answered to my satisfaction. I give my informed consent for the services to be provided through the use of telemedicine in my medical care

## 2022-04-01 NOTE — H&P (View-Only) (Signed)
History of Present Illness: Alex Cortez is a 72 y.o. male who was referred to me for evaluation of an excisional lymph node biopsy.  He was seen in the ED in December with vomiting, and had a CT scan which showed diffuse retroperitoneal, iliac, and inguinal lymphadenopathy. He was seen by heme/onc on 03/16/22 and referred for an excisional lymph node biopsy due to concern for lymphoma. Today he denies any night sweats, fevers, or loss of appetite. He has not noticed any new lumps under his arms or in his groin. He has noticed swelling in the right leg.   He has a history of CAD and is on Plavix, and had a stent placed in 2011. His most recent echo in 2022 in [facility] system was reportedly normal. He had an NM stress test in 2021 showing previous infarct vs artifact. His cardiologist is Dr. Harl Bowie. Today the patient denies chest pain but reports dyspnea on exertion.     Review of Systems: A complete review of systems was obtained from the patient.  I have reviewed this information and discussed as appropriate with the patient.  See HPI as well for other ROS.       Medical History: Past Medical History      Past Medical History:  Diagnosis Date   Diabetes mellitus without complication (CMS-HCC)     History of stroke     Hyperlipidemia     Hypertension     Sleep apnea          There is no problem list on file for this patient.     Past Surgical History  History reviewed. No pertinent surgical history.      Allergies  No Known Allergies           Current Outpatient Medications on File Prior to Visit  Medication Sig Dispense Refill   amLODIPine (NORVASC) 10 MG tablet Take 10 mg by mouth once daily       carvediloL (COREG) 25 MG tablet Take 25 mg by mouth 2 (two) times daily       clopidogreL (PLAVIX) 75 mg tablet Take 75 mg by mouth once daily       ezetimibe (ZETIA) 10 mg tablet Take 1 tablet by mouth once daily       FARXIGA 5 mg Tab tablet Take 1 tablet by mouth every morning  before breakfast (0630)       fluticasone propionate (FLONASE) 50 mcg/actuation nasal spray Place 2 sprays into both nostrils once daily       HUMULIN R U-500, CONC, KWIKPEN 500 unit/mL (3 mL) pen injector INJECT AS DIRECTED 80 UNITS WITH BREAKFAST, 80 UNITS WITH LUNCH, AND 80 UNITS WITH SUPPER FOR PRE-MEAL BLOOD GLUCOSE READINGS OF '90MG'$ /DL OR ABOVE       isosorbide mononitrate (IMDUR) 30 MG ER tablet Take 1 tablet by mouth once daily       levothyroxine (SYNTHROID) 75 MCG tablet Take 1 tablet by mouth every morning before breakfast (0630)       losartan (COZAAR) 50 MG tablet Take 50 mg by mouth once daily       metFORMIN (GLUCOPHAGE) 1000 MG tablet TAKE 1 TABLET(S) ORAL TWO TIMES A DAY FOR DIABETES       rosuvastatin (CRESTOR) 40 MG tablet Take 40 mg by mouth once daily        No current facility-administered medications on file prior to visit.      Family History       Family  History  Problem Relation Age of Onset   Colon cancer Mother          Social History        Tobacco Use  Smoking Status Former   Types: Cigarettes   Quit date: 1991   Years since quitting: 33.0  Smokeless Tobacco Never      Social History  Social History         Socioeconomic History   Marital status: Married  Tobacco Use   Smoking status: Former      Types: Cigarettes      Quit date: 1991      Years since quitting: 33.0   Smokeless tobacco: Never  Vaping Use   Vaping Use: Never used  Substance and Sexual Activity   Alcohol use: Not Currently   Drug use: Never        Objective:         Vitals:    04/01/22 1326  BP: (!) 140/76  Pulse: 87  Temp: 37.1 C (98.7 F)  SpO2: 98%  Weight: (!) 126.4 kg (278 lb 9.6 oz)  Height: 190.5 cm ('6\' 3"'$ )  PainSc: 0-No pain    Body mass index is 34.82 kg/m.   Physical Exam Vitals reviewed.  Constitutional:      General: He is not in acute distress.    Appearance: Normal appearance.  HENT:     Head: Normocephalic and atraumatic.   Cardiovascular:     Rate and Rhythm: Normal rate and regular rhythm.  Pulmonary:     Effort: Pulmonary effort is normal. No respiratory distress.     Breath sounds: Normal breath sounds.  Lymphadenopathy:     Comments: Bilateral inguinal adenopathy, more prominent in the right groin.  Skin:    General: Skin is warm and dry.  Neurological:     General: No focal deficit present.     Mental Status: He is alert and oriented to person, place, and time.            Assessment and Plan:  Diagnoses and all orders for this visit:   Lymphadenopathy     Alex Cortez is a 72 yo male with lymphadenopathy, referred for excisional lymph node biopsy. I personally reviewed his imaging, referral notes, and labs. I reviewed the details of this procedure and the benefits and risks of bleeding, infection, and postoperative seroma. He expressed understanding and agrees to proceed with surgery. I recommend biopsy of one of the right inguinal nodes as they are easily palpable. Will request preoperative risk stratification from his cardiologist. If it is high risk for him to hold his Plavix perioperatively, he may continue taking it throughout the perioperative period, which I discussed with the patient today. He will be contacted to schedule a surgery date as soon as possible.  Michaelle Birks, [facility], Hepatobiliary and Pancreatic Surgery 04/01/22 2:07 PM

## 2022-04-01 NOTE — Telephone Encounter (Signed)
   Pre-operative Risk Assessment    Patient Name: Alex Cortez  DOB: 10-13-1950 MRN: [id number]      Request for Surgical Clearance    Procedure:   Right Inguinal Excision lymph node biopsy  Date of Surgery:  Clearance TBD                                 Surgeon:  Michaelle Birks, MD Surgeon's Group or Practice Name:  [facility] Phone number:  [phone] Fax number:  [phone] Merideth Abbey, CMA   Type of Clearance Requested:   - [place]   Type of Anesthesia:   Choice   Additional requests/questions:   require written clearance and need instructions on how to hold plavix preoperatively, but If too high risk, can continue prior to surgery  Signed, Marlou Sa   04/01/2022, 2:42 PM

## 2022-04-01 NOTE — Telephone Encounter (Signed)
   Name: Alex Cortez  DOB: 12-Sep-1950  MRN: [id number]  Primary Cardiologist: Carlyle Dolly, MD   Preoperative team, please contact this patient and set up a phone call appointment for further preoperative risk assessment. Please obtain consent and complete medication review. Thank you for your help.  I confirm that guidance regarding antiplatelet and oral anticoagulation therapy has been completed and, if necessary, noted below.  If necessary his Plavix may be held for 5  days prior to his procedure.  Please resume as soon as hemostasis is achieved.   Deberah Pelton, NP 04/01/2022, 3:13 PM [place]

## 2022-04-06 ENCOUNTER — Encounter: Payer: Self-pay | Admitting: Nurse Practitioner

## 2022-04-06 ENCOUNTER — Ambulatory Visit: Payer: Medicare Other | Attending: Cardiology | Admitting: Nurse Practitioner

## 2022-04-06 DIAGNOSIS — Z01810 Encounter for preprocedural cardiovascular examination: Secondary | ICD-10-CM

## 2022-04-06 NOTE — Progress Notes (Signed)
Virtual Visit via Telephone Note   Because of [name]'s co-morbid illnesses, he is at least at moderate risk for complications without adequate follow up.  This format is felt to be most appropriate for this patient at this time.  The patient did not have access to video technology/had technical difficulties with video requiring transitioning to audio format only (telephone).  All issues noted in this document were discussed and addressed.  No physical exam could be performed with this format.  Please refer to the patient's chart for his consent to telehealth for [facility].  Evaluation Performed:  Preoperative cardiovascular risk assessment _____________   Date:  04/06/2022   Patient ID:  Alex Cortez, DOB 10-02-1950, MRN [id number] Patient Location:  Home Provider location:   Office  Primary Care Provider:  Curlene Labrum, MD Primary Cardiologist:  Carlyle Dolly, MD  Chief Complaint / Patient Profile   72 y.o. y/o male with a h/o CAD s/p DES-LAD in 2011, NSVT, hypertension, hyperlipidemia, type 2 diabetes, CVA, and OSA who is pending right inguinal excisional lymph node biopsy with Dr. Michaelle Birks of [facility] surgery and presents today for telephonic preoperative cardiovascular risk assessment.  History of Present Illness    Alex Cortez is a 72 y.o. male who presents via audio/video conferencing for a telehealth visit today.  Pt was last seen in cardiology clinic on 12/04/2021 by Dr. Harl Bowie.  At that time Alex Cortez was doing well.  The patient is now pending procedure as outlined above. Since his last visit, he has done well from a cardiac standpoint.   He denies chest pain, palpitations, dyspnea, pnd, orthopnea, n, v, dizziness, syncope, edema, weight gain, or early satiety. All other systems reviewed and are otherwise negative except as noted above.   Past Medical History    Past Medical History:  Diagnosis Date   CAD (coronary  artery disease)    nonobstructive CAD/patent LAD stent site; EF 65%, 08/2011; NSTEMI/DES LAD, 6/20 11   Chest pain    Diabetes mellitus    insulin-dependent   Dyslipidemia    HTN (hypertension)    Post MI syndrome ([place]) 08-29-2009   Stroke ([place]) 10/22/2012   Left inferior pons   Past Surgical History:  Procedure Laterality Date   COLONOSCOPY  08/2021   CORONARY ANGIOPLASTY WITH STENT PLACEMENT      Allergies  Allergies  Allergen Reactions   Ace Inhibitors Swelling    Angioedema   Other Swelling    Shellfish  Swelling and hives    Home Medications    Prior to Admission medications   Medication Sig Start Date End Date Taking? Authorizing Provider  amLODipine (NORVASC) 10 MG tablet Take 10 mg by mouth daily. for high blood pressure 10/05/18   [provider]  carvedilol (COREG) 25 MG tablet Take 1 tablet (25 mg total) by mouth 2 (two) times daily. 12/04/21   Arnoldo Lenis, MD  clopidogrel (PLAVIX) 75 MG tablet Take 1 tablet (75 mg total) by mouth daily. 11/28/17   Frann Rider, NP  Continuous Blood Gluc Receiver (FREESTYLE LIBRE 2 READER) DEVI As directed 08/21/21   Cassandria Anger, MD  Continuous Blood Gluc Sensor (FREESTYLE LIBRE 2 SENSOR) MISC APPLY 1 SENSOR EVERY 14    DAYS 02/03/22   Cassandria Anger, MD  ezetimibe (ZETIA) 10 MG tablet Take 1 tablet (10 mg total) by mouth daily. 12/04/21   Arnoldo Lenis, MD  FARXIGA 5 MG TABS  tablet TAKE 1 TABLET BY MOUTH EVERY DAY BEFORE BREAKFAST 03/10/22   Cassandria Anger, MD  fluticasone (FLONASE) 50 MCG/ACT nasal spray Place 2 sprays into both nostrils daily. 05/31/18   [provider]  insulin regular human CONCENTRATED (HUMULIN R U-500 KWIKPEN) 500 UNIT/ML KwikPen INJECT AS DIRECTED 80 UNITS WITH BREAKFAST, 80 UNITS WITH LUNCH, AND 80 UNITS WITH SUPPER FOR PRE-MEAL BLOOD GLUCOSE READINGS OF '90MG'$ /DL OR ABOVE 03/23/22   Nida, Marella Chimes, MD  isosorbide mononitrate (IMDUR) 30 MG 24 hr tablet TAKE  1 TABLET BY MOUTH EVERY DAY 10/01/21   Arnoldo Lenis, MD  levothyroxine (SYNTHROID) 75 MCG tablet TAKE 1 TABLET BY MOUTH EVERY DAY BEFORE BREAKFAST 03/10/22   Cassandria Anger, MD  losartan (COZAAR) 50 MG tablet Take 1 tablet (50 mg total) by mouth daily. 12/11/21   Arnoldo Lenis, MD  metFORMIN (GLUCOPHAGE) 1000 MG tablet Take 1,000 mg by mouth daily with breakfast. 07/21/14   [provider]  nitroGLYCERIN (NITROSTAT) 0.4 MG SL tablet Place 1 tablet (0.4 mg total) under the tongue every 5 (five) minutes as needed. 08/30/11   de Stanford Scotland, MD  ondansetron (ZOFRAN) 4 MG tablet Take 1 tablet (4 mg total) by mouth every 6 (six) hours. 02/14/22   Fransico Meadow, MD  ONE TOUCH ULTRA TEST test strip USE TO TEST BLOOD SUGAR FOUR TIMES DAILY E11.65 12/16/15   Cassandria Anger, MD  rosuvastatin (CRESTOR) 40 MG tablet TAKE 1 TABLET BY MOUTH EVERY DAY 10/06/21   Arnoldo Lenis, MD    Physical Exam    Vital Signs:  Alex Cortez does not have vital signs available for review today.  Given telephonic nature of communication, physical exam is limited. AAOx3. NAD. Normal affect.  Speech and respirations are unlabored.  Accessory Clinical Findings    None  Assessment & Plan    1.  Preoperative Cardiovascular Risk Assessment:  According to the Revised Cardiac Risk Index (RCRI), his Perioperative Risk of Major Cardiac Event is (%): 11. His Functional Capacity in METs is: 7.99 according to the [facility] Activity Status Index (DASI). Therefore, based on ACC/AHA guidelines, patient would be at acceptable risk for the planned procedure without further cardiovascular testing.  The patient was advised that if he develops new symptoms prior to surgery to contact our office to arrange for a follow-up visit, and he verbalized understanding.  From a cardiac standpoint, if necessary, his Plavix may be held for 5 days prior to his procedure. However, pt is on Plavix also for history of  CVA, recommend confirming Plavix hold with PCP/neurology as well. Please resume Plavix as soon as possible postprocedure, at the discretion of the surgeon.   A copy of this note will be routed to requesting surgeon.  Time:   Today, I have spent 6 minutes with the patient with telehealth technology discussing medical history, symptoms, and management plan.     Lenna Sciara, NP  04/06/2022, 3:13 PM

## 2022-04-07 ENCOUNTER — Encounter: Payer: Self-pay | Admitting: *Deleted

## 2022-04-07 ENCOUNTER — Ambulatory Visit: Payer: Self-pay | Admitting: *Deleted

## 2022-04-07 NOTE — Patient Outreach (Signed)
  Care Coordination   Follow Up Visit Note   04/07/2022 Name: Alex Cortez MRN: [id number] DOB: January 26, 1951  Alex Cortez is a 72 y.o. year old male who sees Burdine, Virgina Evener, MD for primary care. I spoke with  Alex Cortez by phone today.  What matters to the patients health and wellness today?  Scheduling excisional biopsy of lymph     Goals Addressed             This Visit's Progress    Manage Lymphadenopathy       Care Coordination Interventions: Evaluation of current treatment plan related to lymphadenopathy and patient's adherence to plan as established by provider Assessed social determinant of health barriers Assessed family/social support Reviewed recent lab, imaging reports, and office notes Discussed right lower leg edema and enlarged lymph nodes in abdomen Biopsy from 03/16/22 was abnormal Discussed plan for right inguinal excisional lymph node biopsy with Dr. Michaelle Birks of [facility] surgery. This is still pending scheduling.  Reviewed cardiac clearance visit note from yesterday Verified that patient will have transportation to and from surgical appt and assistance at home afterwards Patient reports that he's not worried or stressed about the upcoming procedure or results Provided with [facility] telephone number and encouraged to reach out as needed        SDOH assessments and interventions completed:  Yes  SDOH Interventions Today    Flowsheet Row Most Recent Value  SDOH Interventions   Transportation Interventions Intervention Not Indicated  Stress Interventions Intervention Not Indicated          04/07/2022    9:17 AM 01/12/2018    9:44 AM 11/30/2017    1:42 PM 11/30/2017   11:11 AM 10/26/2017    5:44 PM  Depression screen PHQ 2/9  Decreased Interest 0 0 0 0 0  Down, Depressed, Hopeless 0  0 0 0  PHQ - 2 Score 0 0 0 0 0     Care Coordination Interventions:  Yes, provided   Follow up plan: Follow up call scheduled for 05/04/22     Encounter Outcome:  Pt. Visit Completed   Chong Sicilian, BSN, RN-BC RN Care Coordinator [place]: [phone] Main #: [phone]

## 2022-04-09 NOTE — Patient Instructions (Signed)
DUE TO COVID-19 ONLY TWO VISITORS  (aged 73 and older)  ARE ALLOWED TO COME WITH YOU AND STAY IN THE WAITING ROOM ONLY DURING PRE OP AND PROCEDURE.   **NO VISITORS ARE ALLOWED IN THE SHORT STAY AREA OR RECOVERY ROOM!!**  IF YOU WILL BE ADMITTED INTO THE HOSPITAL YOU ARE ALLOWED ONLY FOUR SUPPORT PEOPLE DURING VISITATION HOURS ONLY (7 AM -8PM)   The support person(s) must pass our screening, gel in and out, and wear a mask at all times, including in the patient's room. Patients must also wear a mask when staff or their support person are in the room. Visitors GUEST BADGE MUST BE WORN VISIBLY  One adult visitor may remain with you overnight and MUST be in the room by 8 P.M.     Your procedure is scheduled on: 04/14/22   Report to [facility] Main Entrance    Report to admitting at : 7:45 AM   Call this number if you have problems the morning of surgery [phone]   Do not eat food :After Midnight.   After Midnight you may have the following liquids until: 7:00 AM DAY OF SURGERY  Water Black Coffee (sugar ok, NO MILK/CREAM OR CREAMERS)  Tea (sugar ok, NO MILK/CREAM OR CREAMERS) regular and decaf                             Plain Jell-O (NO RED)                                           Fruit ices (not with fruit pulp, NO RED)                                     Popsicles (NO RED)                                                                  Juice: apple, WHITE grape, WHITE cranberry Sports drinks like Gatorade (NO RED)    Oral Hygiene is also important to reduce your risk of infection.                                    Remember - BRUSH YOUR TEETH THE MORNING OF SURGERY WITH YOUR REGULAR TOOTHPASTE  DENTURES WILL BE REMOVED PRIOR TO SURGERY PLEASE DO NOT APPLY "Poly grip" OR ADHESIVES!!!   Do NOT smoke after Midnight   Take these medicines the morning of surgery with A SIP OF WATER: isosorbide,carvedilol,amlodipine,levothyroxine.  How to Manage Your Diabetes Before  and After Surgery  Why is it important to control my blood sugar before and after surgery? Improving blood sugar levels before and after surgery helps healing and can limit problems. A way of improving blood sugar control is eating a healthy diet by:  Eating less sugar and carbohydrates  Increasing activity/exercise  Talking with your doctor about reaching your blood sugar goals High blood sugars (greater than 180 mg/dL) can raise  your risk of infections and slow your recovery, so you will need to focus on controlling your diabetes during the weeks before surgery. Make sure that the doctor who takes care of your diabetes knows about your planned surgery including the date and location.  How do I manage my blood sugar before surgery? Check your blood sugar at least 4 times a day, starting 2 days before surgery, to make sure that the level is not too high or low. Check your blood sugar the morning of your surgery when you wake up and every 2 hours until you get to the Short Stay unit. If your blood sugar is less than 70 mg/dL, you will need to treat for low blood sugar: Do not take insulin. Treat a low blood sugar (less than 70 mg/dL) with  cup of clear juice (cranberry or apple), 4 glucose tablets, OR glucose gel. Recheck blood sugar in 15 minutes after treatment (to make sure it is greater than 70 mg/dL). If your blood sugar is not greater than 70 mg/dL on recheck, call [phone] for further instructions. Report your blood sugar to the short stay nurse when you get to Short Stay.  If you are admitted to the hospital after surgery: Your blood sugar will be checked by the staff and you will probably be given insulin after surgery (instead of oral diabetes medicines) to make sure you have good blood sugar levels. The goal for blood sugar control after surgery is 80-180 mg/dL.   WHAT DO I DO ABOUT MY DIABETES MEDICATION?  Do not take oral diabetes medicines (pills) the morning of  surgery.  THE NIGHT BEFORE SURGERY, DO NOT take the evening dose of humulin insulin. Take metformin as usual.   THE MORNING OF SURGERY, DO NOT TAKE ANY ORAL DIABETIC MEDICATIONS DAY OF YOUR SURGERY. . If your CBG is greater than 220 mg/dL, you may take  of your sliding scale  (correction) dose of insulin.                              You may not have any metal on your body including hair pins, jewelry, and body piercing             Do not wear  lotions, powders, perfumes/cologne, or deodorant              Men may shave face and neck.   Do not bring valuables to the hospital. [place].   Contacts, glasses, or bridgework may not be worn into surgery.   Bring small overnight bag day of surgery.   DO NOT [place]. PHARMACY WILL DISPENSE MEDICATIONS LISTED ON YOUR MEDICATION LIST TO YOU DURING YOUR ADMISSION [place]!    Patients discharged on the day of surgery will not be allowed to drive home.  Someone NEEDS to stay with you for the first 24 hours after anesthesia.   Special Instructions: Bring a copy of your healthcare power of attorney and living will documents         the day of surgery if you haven't scanned them before.              Please read over the following fact sheets you were given: IF YOU HAVE [place] [phone]  [facility] - Preparing for Surgery Before surgery, you can play an important role.  Because skin is not sterile, your skin needs to be as free of germs as possible.  You can reduce the number of germs on your skin by washing with CHG (chlorahexidine gluconate) soap before surgery.  CHG is an antiseptic cleaner which kills germs and bonds with the skin to continue killing germs even after washing. Please DO NOT use if you have an allergy to CHG or antibacterial soaps.  If your skin becomes reddened/irritated stop using the  CHG and inform your nurse when you arrive at Short Stay. Do not shave (including legs and underarms) for at least 48 hours prior to the first CHG shower.  You may shave your face/neck. Please follow these instructions carefully:  1.  Shower with CHG Soap the night before surgery and the  morning of Surgery.  2.  If you choose to wash your hair, wash your hair first as usual with your  normal  shampoo.  3.  After you shampoo, rinse your hair and body thoroughly to remove the  shampoo.                           4.  Use CHG as you would any other liquid soap.  You can apply chg directly  to the skin and wash                       Gently with a scrungie or clean washcloth.  5.  Apply the CHG Soap to your body ONLY FROM THE NECK DOWN.   Do not use on face/ open                           Wound or open sores. Avoid contact with eyes, ears mouth and genitals (private parts).                       Wash face,  Genitals (private parts) with your normal soap.             6.  Wash thoroughly, paying special attention to the area where your surgery  will be performed.  7.  Thoroughly rinse your body with warm water from the neck down.  8.  DO NOT shower/wash with your normal soap after using and rinsing off  the CHG Soap.                9.  Pat yourself dry with a clean towel.            10.  Wear clean pajamas.            11.  Place clean sheets on your bed the night of your first shower and do not  sleep with pets. Day of Surgery : Do not apply any lotions/deodorants the morning of surgery.  Please wear clean clothes to the hospital/surgery center.  FAILURE TO FOLLOW THESE INSTRUCTIONS MAY RESULT IN THE CANCELLATION OF YOUR SURGERY PATIENT SIGNATURE_________________________________  NURSE SIGNATURE__________________________________  ________________________________________________________________________

## 2022-04-12 ENCOUNTER — Encounter (HOSPITAL_COMMUNITY)
Admission: RE | Admit: 2022-04-12 | Discharge: 2022-04-12 | Disposition: A | Discharge status: Home / Self Care | Payer: Medicare Other | Source: Ambulatory Visit | Attending: Family Medicine | Admitting: Family Medicine

## 2022-04-12 DIAGNOSIS — E1169 Type 2 diabetes mellitus with other specified complication: Secondary | ICD-10-CM

## 2022-04-13 ENCOUNTER — Encounter (HOSPITAL_COMMUNITY): Payer: Self-pay

## 2022-04-13 ENCOUNTER — Other Ambulatory Visit: Payer: Self-pay

## 2022-04-13 ENCOUNTER — Encounter (HOSPITAL_COMMUNITY)
Admission: RE | Admit: 2022-04-13 | Discharge: 2022-04-13 | Disposition: A | Discharge status: Home / Self Care | Payer: Medicare Other | Source: Ambulatory Visit | Attending: Surgery | Admitting: Surgery

## 2022-04-13 DIAGNOSIS — E669 Obesity, unspecified: Secondary | ICD-10-CM | POA: Diagnosis not present

## 2022-04-13 DIAGNOSIS — E1159 Type 2 diabetes mellitus with other circulatory complications: Secondary | ICD-10-CM

## 2022-04-13 DIAGNOSIS — E1169 Type 2 diabetes mellitus with other specified complication: Secondary | ICD-10-CM

## 2022-04-13 DIAGNOSIS — Z01812 Encounter for preprocedural laboratory examination: Secondary | ICD-10-CM | POA: Insufficient documentation

## 2022-04-13 HISTORY — DX: Hypothyroidism, unspecified: E03.9

## 2022-04-13 LAB — HEMOGLOBIN A1C
Hgb A1c MFr Bld: 8.1 % — ABNORMAL HIGH (ref 4.8–5.6)
Mean Plasma Glucose: 185.77 mg/dL

## 2022-04-13 LAB — GLUCOSE, CAPILLARY: Glucose-Capillary: 179 mg/dL — ABNORMAL HIGH (ref 70–99)

## 2022-04-13 MED ORDER — FREESTYLE LIBRE 2 SENSOR MISC: 0 refills | Status: DC

## 2022-04-13 NOTE — Progress Notes (Signed)
Anesthesia note:  Bowel prep reminder:  NA  PCP - Dr. Lizbeth Bark Cardiologist -Dr. Zandra Abts Other-   Chest x-ray - 02/13/22-epic EKG - 02/13/22-epic Stress Test - 2021 ECHO - 2019 Cardiac Cath - 2013 CABG-NA Pacemaker/ICD device last checked:NA  Sleep Study - yes CPAP - Pt wasn't able to tolerate the mask  - CBG at PAT visit-179 Fasting Blood Sugar at home-69-160 Checks Blood Sugar Freestyle libre on Rt upper arm_____  Blood Thinner:Plavix Blood Thinner Instructions:stop 5 days prior to DOS Aspirin Instructions: Last Dose:1/26  Anesthesia review: Yes   Patient denies shortness of breath, fever, cough and chest pain at PAT appointment. Pt gets SOB on 1 flight of stairs this is normal for him.  Farxiga held since 04/10/22  Patient verbalized understanding of instructions that were given to them at the PAT appointment. Patient was also instructed that they will need to review over the PAT instructions again at home before surgery.yes

## 2022-04-14 ENCOUNTER — Encounter (HOSPITAL_COMMUNITY): Payer: Self-pay | Admitting: Surgery

## 2022-04-14 ENCOUNTER — Ambulatory Visit (HOSPITAL_BASED_OUTPATIENT_CLINIC_OR_DEPARTMENT_OTHER): Payer: Medicare Other | Admitting: Anesthesiology

## 2022-04-14 ENCOUNTER — Encounter (HOSPITAL_COMMUNITY)
Admission: RE | Disposition: A | Discharge status: Home / Self Care | Payer: Self-pay | Source: Home / Self Care | Attending: Surgery

## 2022-04-14 ENCOUNTER — Ambulatory Visit (HOSPITAL_COMMUNITY)
Admission: RE | Admit: 2022-04-14 | Discharge: 2022-04-14 | Disposition: A | Discharge status: Home / Self Care | Payer: Medicare Other | Attending: Surgery | Admitting: Surgery

## 2022-04-14 ENCOUNTER — Ambulatory Visit (HOSPITAL_COMMUNITY): Payer: Medicare Other | Admitting: Anesthesiology

## 2022-04-14 ENCOUNTER — Other Ambulatory Visit: Payer: Self-pay

## 2022-04-14 DIAGNOSIS — I252 Old myocardial infarction: Secondary | ICD-10-CM

## 2022-04-14 DIAGNOSIS — R591 Generalized enlarged lymph nodes: Secondary | ICD-10-CM

## 2022-04-14 DIAGNOSIS — Z79890 Hormone replacement therapy (postmenopausal): Secondary | ICD-10-CM | POA: Diagnosis not present

## 2022-04-14 DIAGNOSIS — I2510 Atherosclerotic heart disease of native coronary artery without angina pectoris: Secondary | ICD-10-CM | POA: Diagnosis not present

## 2022-04-14 DIAGNOSIS — E1169 Type 2 diabetes mellitus with other specified complication: Secondary | ICD-10-CM

## 2022-04-14 DIAGNOSIS — R590 Localized enlarged lymph nodes: Secondary | ICD-10-CM | POA: Diagnosis present

## 2022-04-14 DIAGNOSIS — C8335 Diffuse large B-cell lymphoma, lymph nodes of inguinal region and lower limb: Secondary | ICD-10-CM | POA: Diagnosis not present

## 2022-04-14 DIAGNOSIS — Z794 Long term (current) use of insulin: Secondary | ICD-10-CM | POA: Insufficient documentation

## 2022-04-14 DIAGNOSIS — E785 Hyperlipidemia, unspecified: Secondary | ICD-10-CM | POA: Insufficient documentation

## 2022-04-14 DIAGNOSIS — E039 Hypothyroidism, unspecified: Secondary | ICD-10-CM | POA: Diagnosis not present

## 2022-04-14 DIAGNOSIS — Z7984 Long term (current) use of oral hypoglycemic drugs: Secondary | ICD-10-CM | POA: Diagnosis not present

## 2022-04-14 DIAGNOSIS — Z7902 Long term (current) use of antithrombotics/antiplatelets: Secondary | ICD-10-CM | POA: Insufficient documentation

## 2022-04-14 DIAGNOSIS — I10 Essential (primary) hypertension: Secondary | ICD-10-CM

## 2022-04-14 DIAGNOSIS — Z87891 Personal history of nicotine dependence: Secondary | ICD-10-CM | POA: Diagnosis not present

## 2022-04-14 DIAGNOSIS — G4730 Sleep apnea, unspecified: Secondary | ICD-10-CM | POA: Insufficient documentation

## 2022-04-14 DIAGNOSIS — E119 Type 2 diabetes mellitus without complications: Secondary | ICD-10-CM | POA: Diagnosis not present

## 2022-04-14 DIAGNOSIS — Z955 Presence of coronary angioplasty implant and graft: Secondary | ICD-10-CM | POA: Diagnosis not present

## 2022-04-14 DIAGNOSIS — Z79899 Other long term (current) drug therapy: Secondary | ICD-10-CM | POA: Diagnosis not present

## 2022-04-14 DIAGNOSIS — C8295 Follicular lymphoma, unspecified, lymph nodes of inguinal region and lower limb: Secondary | ICD-10-CM | POA: Insufficient documentation

## 2022-04-14 HISTORY — PX: LYMPH NODE BIOPSY: SHX201

## 2022-04-14 LAB — GLUCOSE, CAPILLARY
Glucose-Capillary: 216 mg/dL — ABNORMAL HIGH (ref 70–99)
Glucose-Capillary: 224 mg/dL — ABNORMAL HIGH (ref 70–99)

## 2022-04-14 SURGERY — LYMPH NODE BIOPSY
Anesthesia: General | Site: Groin | Laterality: Right

## 2022-04-14 MED ORDER — ROCURONIUM BROMIDE 10 MG/ML (PF) SYRINGE
PREFILLED_SYRINGE | INTRAVENOUS | Status: AC
  Filled 2022-04-14: qty 10

## 2022-04-14 MED ORDER — BUPIVACAINE HCL (PF) 0.5 % IJ SOLN
INTRAMUSCULAR | Status: AC
  Filled 2022-04-14: qty 30

## 2022-04-14 MED ORDER — 0.9 % SODIUM CHLORIDE (POUR BTL) OPTIME
TOPICAL | Status: DC | PRN
  Administered 2022-04-14: 450 mL

## 2022-04-14 MED ORDER — MIDAZOLAM HCL 2 MG/2ML IJ SOLN
INTRAMUSCULAR | Status: AC
  Filled 2022-04-14: qty 2

## 2022-04-14 MED ORDER — FENTANYL CITRATE PF 50 MCG/ML IJ SOSY: 25.0000 ug | PREFILLED_SYRINGE | INTRAMUSCULAR | Status: DC | PRN

## 2022-04-14 MED ORDER — DEXAMETHASONE SODIUM PHOSPHATE 10 MG/ML IJ SOLN
INTRAMUSCULAR | Status: DC | PRN
  Administered 2022-04-14: 10 mg via INTRAVENOUS

## 2022-04-14 MED ORDER — ONDANSETRON HCL 4 MG/2ML IJ SOLN
INTRAMUSCULAR | Status: DC | PRN
  Administered 2022-04-14: 4 mg via INTRAVENOUS

## 2022-04-14 MED ORDER — ORAL CARE MOUTH RINSE: 15.0000 mL | Freq: Once | OROMUCOSAL | Status: AC

## 2022-04-14 MED ORDER — MEPERIDINE HCL 50 MG/ML IJ SOLN: 6.2500 mg | INTRAMUSCULAR | Status: DC | PRN

## 2022-04-14 MED ORDER — PROPOFOL 10 MG/ML IV BOLUS
INTRAVENOUS | Status: AC
  Filled 2022-04-14: qty 20

## 2022-04-14 MED ORDER — ACETAMINOPHEN 325 MG PO TABS: 325.0000 mg | ORAL_TABLET | ORAL | Status: DC | PRN

## 2022-04-14 MED ORDER — HYDROCODONE-ACETAMINOPHEN 5-325 MG PO TABS: 1.0000 | ORAL_TABLET | Freq: Four times a day (QID) | ORAL | 0 refills | Status: AC | PRN

## 2022-04-14 MED ORDER — OXYCODONE HCL 5 MG/5ML PO SOLN: 5.0000 mg | Freq: Once | ORAL | Status: DC | PRN

## 2022-04-14 MED ORDER — ACETAMINOPHEN 160 MG/5ML PO SOLN: 325.0000 mg | ORAL | Status: DC | PRN

## 2022-04-14 MED ORDER — PROPOFOL 10 MG/ML IV BOLUS
INTRAVENOUS | Status: DC | PRN
  Administered 2022-04-14: 200 mg via INTRAVENOUS

## 2022-04-14 MED ORDER — MIDAZOLAM HCL 5 MG/5ML IJ SOLN
INTRAMUSCULAR | Status: DC | PRN
  Administered 2022-04-14: 1 mg via INTRAVENOUS

## 2022-04-14 MED ORDER — ACETAMINOPHEN 500 MG PO TABS
1000.0000 mg | ORAL_TABLET | ORAL | Status: AC
  Administered 2022-04-14: 1000 mg via ORAL
  Filled 2022-04-14: qty 2

## 2022-04-14 MED ORDER — CHLORHEXIDINE GLUCONATE 0.12 % MT SOLN
15.0000 mL | Freq: Once | OROMUCOSAL | Status: AC
  Administered 2022-04-14: 15 mL via OROMUCOSAL

## 2022-04-14 MED ORDER — ONDANSETRON HCL 4 MG/2ML IJ SOLN
INTRAMUSCULAR | Status: AC
  Filled 2022-04-14: qty 2

## 2022-04-14 MED ORDER — SODIUM CHLORIDE 0.9 % IV SOLN: INTRAVENOUS | Status: DC | PRN

## 2022-04-14 MED ORDER — LACTATED RINGERS IV SOLN: INTRAVENOUS | Status: DC

## 2022-04-14 MED ORDER — FENTANYL CITRATE (PF) 250 MCG/5ML IJ SOLN
INTRAMUSCULAR | Status: AC
  Filled 2022-04-14: qty 5

## 2022-04-14 MED ORDER — LIDOCAINE 2% (20 MG/ML) 5 ML SYRINGE
INTRAMUSCULAR | Status: DC | PRN
  Administered 2022-04-14: 80 mg via INTRAVENOUS

## 2022-04-14 MED ORDER — DEXAMETHASONE SODIUM PHOSPHATE 10 MG/ML IJ SOLN
INTRAMUSCULAR | Status: AC
  Filled 2022-04-14: qty 1

## 2022-04-14 MED ORDER — ONDANSETRON HCL 4 MG/2ML IJ SOLN: 4.0000 mg | Freq: Once | INTRAMUSCULAR | Status: DC | PRN

## 2022-04-14 MED ORDER — LIDOCAINE HCL (PF) 2 % IJ SOLN
INTRAMUSCULAR | Status: AC
  Filled 2022-04-14: qty 5

## 2022-04-14 MED ORDER — SUGAMMADEX SODIUM 500 MG/5ML IV SOLN
INTRAVENOUS | Status: AC
  Filled 2022-04-14: qty 5

## 2022-04-14 MED ORDER — CEFAZOLIN IN SODIUM CHLORIDE 3-0.9 GM/100ML-% IV SOLN
3.0000 g | INTRAVENOUS | Status: AC
  Administered 2022-04-14: 3 g via INTRAVENOUS
  Filled 2022-04-14: qty 100

## 2022-04-14 MED ORDER — FENTANYL CITRATE (PF) 100 MCG/2ML IJ SOLN
INTRAMUSCULAR | Status: DC | PRN
  Administered 2022-04-14 (×2): 25 ug via INTRAVENOUS

## 2022-04-14 MED ORDER — BUPIVACAINE HCL (PF) 0.5 % IJ SOLN
INTRAMUSCULAR | Status: DC | PRN
  Administered 2022-04-14: 30 mL

## 2022-04-14 MED ORDER — OXYCODONE HCL 5 MG PO TABS: 5.0000 mg | ORAL_TABLET | Freq: Once | ORAL | Status: DC | PRN

## 2022-04-14 SURGICAL SUPPLY — 40 items
ADH SKN CLS APL DERMABOND .7 (GAUZE/BANDAGES/DRESSINGS) ×1
APL PRP STRL LF DISP 70% ISPRP (MISCELLANEOUS) ×1
APPLIER CLIP 9.375 MED OPEN (MISCELLANEOUS) ×1 IMPLANT
APR CLP MED 9.3 20 MLT OPN (MISCELLANEOUS) ×1
BAG COUNTER SPONGE SURGICOUNT (BAG) IMPLANT
BAG SPNG CNTER NS LX DISP (BAG)
BLADE SURG SZ11 CARB STEEL (BLADE) ×1 IMPLANT
CHLORAPREP W/TINT 26 (MISCELLANEOUS) ×1 IMPLANT
CLIP APPLIE 9.375 MED OPEN (MISCELLANEOUS) IMPLANT
CNTNR URN SCR LID CUP LEK RST (MISCELLANEOUS) IMPLANT
CONT SPEC 4OZ STRL OR WHT (MISCELLANEOUS) ×1
COVER SURGICAL LIGHT HANDLE (MISCELLANEOUS) ×1 IMPLANT
DERMABOND ADVANCED .7 DNX12 (GAUZE/BANDAGES/DRESSINGS) ×1 IMPLANT
DRAPE LAPAROSCOPIC ABDOMINAL (DRAPES) IMPLANT
DRAPE LAPAROTOMY T 98X78 PEDS (DRAPES) IMPLANT
DRAPE LAPAROTOMY TRNSV 102X78 (DRAPES) IMPLANT
ELECT REM PT RETURN 15FT ADLT (MISCELLANEOUS) ×1 IMPLANT
GAUZE PAD ABD 8X10 STRL (GAUZE/BANDAGES/DRESSINGS) IMPLANT
GAUZE SPONGE 4X4 12PLY STRL (GAUZE/BANDAGES/DRESSINGS) IMPLANT
GLOVE BIO SURGEON STRL SZ 6 (GLOVE) ×1 IMPLANT
GLOVE BIOGEL PI IND STRL 6 (GLOVE) IMPLANT
GLOVE BIOGEL PI IND STRL 7.0 (GLOVE) IMPLANT
GLOVE BIOGEL PI MICRO STRL 5.5 (GLOVE) ×1 IMPLANT
GLOVE INDICATOR 6.5 STRL GRN (GLOVE) ×1 IMPLANT
GLOVE SURG SS PI 7.0 STRL IVOR (GLOVE) ×1 IMPLANT
GOWN STRL REUS W/ TWL LRG LVL3 (GOWN DISPOSABLE) ×1 IMPLANT
GOWN STRL REUS W/TWL LRG LVL3 (GOWN DISPOSABLE) ×2
KIT BASIN OR (CUSTOM PROCEDURE TRAY) ×1 IMPLANT
KIT TURNOVER KIT A (KITS) IMPLANT
NEEDLE HYPO 22GX1.5 SAFETY (NEEDLE) IMPLANT
PACK GENERAL/GYN (CUSTOM PROCEDURE TRAY) ×1 IMPLANT
SPIKE FLUID TRANSFER (MISCELLANEOUS) IMPLANT
SURGILUBE 2OZ TUBE FLIPTOP (MISCELLANEOUS) IMPLANT
SUT MNCRL AB 4-0 PS2 18 (SUTURE) IMPLANT
SUT VIC AB 3-0 SH 8-18 (SUTURE) IMPLANT
SWAB COLLECTION DEVICE MRSA (MISCELLANEOUS) IMPLANT
SWAB CULTURE ESWAB REG 1ML (MISCELLANEOUS) IMPLANT
SYR CONTROL 10ML LL (SYRINGE) IMPLANT
TOWEL OR 17X26 10 PK STRL BLUE (TOWEL DISPOSABLE) ×1 IMPLANT
TOWEL OR NON WOVEN STRL DISP B (DISPOSABLE) ×1 IMPLANT

## 2022-04-14 NOTE — Transfer of Care (Signed)
Immediate Anesthesia Transfer of Care Note  Patient: [name]  Procedure(s) Performed: RIGHT INGUINAL EXCISIONAL LYMPH NODE BIOPSY (Right: Groin)  Patient Location: PACU  Anesthesia Type:General  Level of Consciousness: drowsy  Airway & Oxygen Therapy: Patient Spontanous Breathing and Patient connected to face mask oxygen  Post-op Assessment: Report given to RN and Post -op Vital signs reviewed and stable  Post vital signs: Reviewed and stable  Last Vitals:  Vitals Value Taken Time  BP 126/78 04/14/22 1104  Temp    Pulse 68 04/14/22 1105  Resp 13 04/14/22 1107  SpO2 98 % 04/14/22 1105  Vitals shown include unvalidated device data.  Last Pain:  Vitals:   04/14/22 0828  TempSrc:   PainSc: 0-No pain         Complications: No notable events documented.

## 2022-04-14 NOTE — Discharge Instructions (Addendum)
[facility] [place] [facility] DISCHARGE INSTRUCTIONS  Activity Ok to shower in 24 hours, but do not bathe or submerge incisions underwater. Do not drive while taking narcotic pain medication.  Wound Care Your incision is covered with skin glue called Dermabond. This will peel off on its own over time. You may shower in 24 hours and allow warm soapy water to run over your incision. Gently pat dry. Do not submerge your incision underwater for 2 weeks. Monitor your incision for any new redness, tenderness, or drainage.  When to Call us: Fever greater than 100.5 New redness, drainage, or swelling at incision site Severe pain, nausea, or vomiting  Follow-up You have an appointment scheduled with Dr. Zenia Resides on May 18, 2022 at 10:20am. This will be at the [facility] Surgery office at [address], [place], [place], [place]. Please arrive at least 15 minutes prior to your scheduled appointment time.  You may resume taking your Plavix 24 hours after surgery.  For questions or concerns, please call the office at [phone].

## 2022-04-14 NOTE — Interval H&P Note (Signed)
History and Physical Interval Note:  04/14/2022 9:37 AM  Alex Cortez  has presented today for surgery, with the diagnosis of LYMPHADENOPATHY.  The various methods of treatment have been discussed with the patient and family. After consideration of risks, benefits and other options for treatment, the patient has consented to  Procedure(s): RIGHT INGUINAL EXCISIONAL LYMPH NODE BIOPSY (Right) as a surgical intervention.  The patient's history has been reviewed, patient examined, no change in status, stable for surgery.  I have reviewed the patient's chart and labs.  Questions were answered to the patient's satisfaction.  Surgical site confirmed and marked.   Dwan Bolt

## 2022-04-14 NOTE — Op Note (Signed)
Date: 04/14/22  Patient: Alex Cortez MRN: [id number]  Preoperative Diagnosis: Lymphadenopathy Postoperative Diagnosis: Same  Procedure: Right inguinal excisional lymph node biopsy  Surgeon: Michaelle Birks, MD  EBL: Minimal  Anesthesia: General LMA  Specimens: Right inguinal lymph node  Indications: [name] is a 72 yo male who presented to the ED last month with vomiting and underwent a CT scan for workup. This showed diffuse retroperitoneal, iliac and inguinal lymphadenopathy. He was referred for an excisional lymph node biopsy.  Findings: Significantly enlarged lymph node completely excised from the right proximal thigh. Sent fresh to pathology for lymphoma workup.  Procedure details: Informed consent was obtained in the preoperative area prior to the procedure. The patient was brought to the operating room and placed on the table in the supine position. General anesthesia was induced and appropriate lines and drains were placed for intraoperative monitoring. Perioperative antibiotics were administered per SCIP guidelines. The right groin and proximal thigh were prepped and draped in the usual sterile fashion. A pre-procedure timeout was taken verifying patient identity, surgical site and procedure to be performed.  There was palpable adenopathy in the inguinal crease and on the proximal thigh. A longitudinal skin incision was made over a palpable node on the proximal thigh. The subcutaneous tissue was divided with cautery and the muscle fascia was opened. A massively enlarged lymph node was encountered just deep to this. The node was bluntly dissected out of the surrounding muscle compartment. The hilar vessels were clipped and the node was completely freed. It was sent fresh to pathology. The wound was irrigated and hemostasis was achieved with cautery. The muscle fascia was closed with interrupted 3-0 Vicryl sutures. The deep dermis was closed with interrupted 3-0 Vicryl sutures and  the skin was closed with a running subcuticular 4-0 monocryl suture. Dermabond was applied.  The patient tolerated the procedure well with no apparent complications. All counts were correct x2 at the end of the procedure. The patient was extubated and taken to PACU in stable condition.  Michaelle Birks, MD 04/14/22 10:51 AM

## 2022-04-14 NOTE — Anesthesia Procedure Notes (Signed)
Procedure Name: LMA Insertion Date/Time: 04/14/2022 10:10 AM  Performed by: Sharlette Dense, CRNAPatient Re-evaluated:Patient Re-evaluated prior to induction Oxygen Delivery Method: Circle system utilized Preoxygenation: Pre-oxygenation with 100% oxygen LMA: LMA inserted LMA Size: 5.0 Number of attempts: 1 Placement Confirmation: positive ETCO2 and breath sounds checked- equal and bilateral Tube secured with: Tape Dental Injury: Teeth and Oropharynx as per pre-operative assessment

## 2022-04-14 NOTE — Anesthesia Postprocedure Evaluation (Signed)
Anesthesia Post Note  Patient: Alex Cortez  Procedure(s) Performed: RIGHT INGUINAL EXCISIONAL LYMPH NODE BIOPSY (Right: Groin)     Patient location during evaluation: PACU Anesthesia Type: General Level of consciousness: awake and alert Pain management: pain level controlled Vital Signs Assessment: post-procedure vital signs reviewed and stable Respiratory status: spontaneous breathing, nonlabored ventilation, respiratory function stable and patient connected to nasal cannula oxygen Cardiovascular status: blood pressure returned to baseline and stable Postop Assessment: no apparent nausea or vomiting Anesthetic complications: no   No notable events documented.  Last Vitals:  Vitals:   04/14/22 1215 04/14/22 1238  BP: (!) 145/72 137/67  Pulse: 66 67  Resp: 20 17  Temp:  36.7 C  SpO2: 94% 92%    Last Pain:  Vitals:   04/14/22 1238  TempSrc:   PainSc: Asleep                 Alannah Averhart

## 2022-04-14 NOTE — Anesthesia Preprocedure Evaluation (Addendum)
Anesthesia Evaluation  Patient identified by MRN, date of birth, ID band Patient awake    Reviewed: Allergy & Precautions, H&P , NPO status , Patient's Chart, lab work & pertinent test results  Airway Mallampati: II  TM Distance: >3 FB Neck ROM: Full    Dental no notable dental hx. (+) Teeth Intact, Dental Advisory Given   Pulmonary neg pulmonary ROS, former smoker   Pulmonary exam normal breath sounds clear to auscultation       Cardiovascular Exercise Tolerance: Good hypertension, Pt. on medications + CAD and + Past MI  negative cardio ROS Normal cardiovascular exam Rhythm:Regular Rate:Normal  ECHO 19' - Left ventricle: The cavity size was normal. There was moderate    concentric hypertrophy. Systolic function was normal. The    estimated ejection fraction was in the range of 60% to 65%. LVEF    64.8% by GLS. Wall motion was normal; there were no regional wall    motion abnormalities. Doppler parameters are consistent with    abnormal left ventricular relaxation (grade 1 diastolic    dysfunction). Doppler parameters are consistent with high    ventricular filling pressure.  - Aortic valve: Trileaflet; mildly thickened leaflets.  - Mitral valve: Mild focal calcification of tip of anterior    leaflet. Mildly calcified annulus.     Neuro/Psych CVA negative neurological ROS  negative psych ROS   GI/Hepatic negative GI ROS, Neg liver ROS,,,  Endo/Other  diabetes, Type 2, Insulin DependentHypothyroidism    Renal/GU negative Renal ROS  negative genitourinary   Musculoskeletal negative musculoskeletal ROS (+)    Abdominal   Peds negative pediatric ROS (+)  Hematology negative hematology ROS (+)   Anesthesia Other Findings   Reproductive/Obstetrics negative OB ROS                             Anesthesia Physical Anesthesia Plan  ASA: 4  Anesthesia Plan: General   Post-op Pain  Management: Minimal or no pain anticipated   Induction: Intravenous  PONV Risk Score and Plan: 2 and Ondansetron, Dexamethasone and Treatment may vary due to age or medical condition  Airway Management Planned: Oral ETT  Additional Equipment: None  Intra-op Plan:   Post-operative Plan: Extubation in OR  Informed Consent: I have reviewed the patients History and Physical, chart, labs and discussed the procedure including the risks, benefits and alternatives for the proposed anesthesia with the patient or authorized representative who has indicated his/her understanding and acceptance.       Plan Discussed with: Anesthesiologist and CRNA  Anesthesia Plan Comments: (72 y.o. y/o male with a h/o CAD s/p DES-LAD in 2011, NSVT, hypertension, hyperlipidemia, type 2 diabetes, CVA, and OSA who is pending right inguinal excisional lymph node biopsy with Dr. Michaelle Birks of [facility] surgery and presents today for telephonic preoperative cardiovascular risk assessment.  Preoperative Cardiovascular Risk Assessment:  According to the Revised Cardiac Risk Index (RCRI), his Perioperative Risk of Major Cardiac Event is (%): 11. His Functional Capacity in METs is: 7.99 according to the Duke Activity Status Index (DASI). Therefore, based on ACC/AHA guidelines, patient would be at acceptable risk for the planned procedure without further cardiovascular testing.  )       Anesthesia Quick Evaluation

## 2022-04-15 ENCOUNTER — Encounter (HOSPITAL_COMMUNITY): Payer: Self-pay | Admitting: Surgery

## 2022-04-16 ENCOUNTER — Telehealth: Payer: Self-pay | Admitting: Physician Assistant

## 2022-04-16 DIAGNOSIS — I10 Essential (primary) hypertension: Secondary | ICD-10-CM

## 2022-04-16 DIAGNOSIS — C8335 Diffuse large B-cell lymphoma, lymph nodes of inguinal region and lower limb: Secondary | ICD-10-CM

## 2022-04-16 LAB — SURGICAL PATHOLOGY

## 2022-04-16 NOTE — Telephone Encounter (Addendum)
I called [name] to review the pathology report from the right inguinal lymph node biopsy from 04/14/2022. Per pathology, findings are consistent with DLBCL arising from follicular lymphoma. Dr. Lorenso Courier recommends R-CHOP chemotherapy. We will need staging PET scan, port placement, echo and chemo education before starting treatment. Patient expressed understanding with the plan provided.

## 2022-04-19 ENCOUNTER — Ambulatory Visit (HOSPITAL_COMMUNITY)
Admission: RE | Admit: 2022-04-19 | Discharge: 2022-04-19 | Disposition: A | Discharge status: Home / Self Care | Payer: Medicare Other | Source: Ambulatory Visit | Attending: Physician Assistant | Admitting: Physician Assistant

## 2022-04-19 ENCOUNTER — Telehealth: Payer: Self-pay | Admitting: Hematology and Oncology

## 2022-04-19 ENCOUNTER — Telehealth: Payer: Self-pay

## 2022-04-19 DIAGNOSIS — Z01818 Encounter for other preprocedural examination: Secondary | ICD-10-CM | POA: Insufficient documentation

## 2022-04-19 DIAGNOSIS — Z0189 Encounter for other specified special examinations: Secondary | ICD-10-CM | POA: Diagnosis not present

## 2022-04-19 DIAGNOSIS — I252 Old myocardial infarction: Secondary | ICD-10-CM | POA: Insufficient documentation

## 2022-04-19 DIAGNOSIS — I2510 Atherosclerotic heart disease of native coronary artery without angina pectoris: Secondary | ICD-10-CM | POA: Insufficient documentation

## 2022-04-19 DIAGNOSIS — E119 Type 2 diabetes mellitus without complications: Secondary | ICD-10-CM | POA: Insufficient documentation

## 2022-04-19 DIAGNOSIS — C8335 Diffuse large B-cell lymphoma, lymph nodes of inguinal region and lower limb: Secondary | ICD-10-CM | POA: Insufficient documentation

## 2022-04-19 DIAGNOSIS — I10 Essential (primary) hypertension: Secondary | ICD-10-CM | POA: Diagnosis not present

## 2022-04-19 DIAGNOSIS — E785 Hyperlipidemia, unspecified: Secondary | ICD-10-CM | POA: Insufficient documentation

## 2022-04-19 LAB — ECHOCARDIOGRAM COMPLETE
Area-P 1/2: 3.79 cm2
S' Lateral: 2.8 cm

## 2022-04-19 NOTE — Telephone Encounter (Signed)
Called patient per 2/5 secure chat. Patient scheduled and notified.

## 2022-04-19 NOTE — Telephone Encounter (Signed)
Left a message requesting pt return call to the office. ?

## 2022-04-19 NOTE — Progress Notes (Signed)
  Echocardiogram 2D Echocardiogram has been performed.  Darlina Sicilian M 04/19/2022, 9:42 AM

## 2022-04-21 ENCOUNTER — Ambulatory Visit (HOSPITAL_COMMUNITY)
Admission: RE | Admit: 2022-04-21 | Discharge: 2022-04-21 | Disposition: A | Discharge status: Home / Self Care | Payer: Medicare Other | Source: Ambulatory Visit | Attending: Physician Assistant | Admitting: Physician Assistant

## 2022-04-21 DIAGNOSIS — C8330 Diffuse large B-cell lymphoma, unspecified site: Secondary | ICD-10-CM | POA: Diagnosis not present

## 2022-04-21 DIAGNOSIS — I700 Atherosclerosis of aorta: Secondary | ICD-10-CM | POA: Diagnosis not present

## 2022-04-21 DIAGNOSIS — I2510 Atherosclerotic heart disease of native coronary artery without angina pectoris: Secondary | ICD-10-CM | POA: Diagnosis not present

## 2022-04-21 DIAGNOSIS — N281 Cyst of kidney, acquired: Secondary | ICD-10-CM | POA: Diagnosis not present

## 2022-04-21 DIAGNOSIS — C8335 Diffuse large B-cell lymphoma, lymph nodes of inguinal region and lower limb: Secondary | ICD-10-CM | POA: Insufficient documentation

## 2022-04-21 LAB — GLUCOSE, CAPILLARY: Glucose-Capillary: 119 mg/dL — ABNORMAL HIGH (ref 70–99)

## 2022-04-21 MED ORDER — FLUDEOXYGLUCOSE F - 18 (FDG) INJECTION
13.7200 | Freq: Once | INTRAVENOUS | Status: AC
  Administered 2022-04-21: 13.72 via INTRAVENOUS

## 2022-04-23 ENCOUNTER — Other Ambulatory Visit: Payer: Self-pay | Admitting: Internal Medicine

## 2022-04-23 ENCOUNTER — Inpatient Hospital Stay: Payer: Medicare Other | Attending: Nurse Practitioner

## 2022-04-23 ENCOUNTER — Inpatient Hospital Stay (HOSPITAL_BASED_OUTPATIENT_CLINIC_OR_DEPARTMENT_OTHER): Payer: Medicare Other | Admitting: Hematology and Oncology

## 2022-04-23 ENCOUNTER — Other Ambulatory Visit: Payer: Self-pay | Admitting: Hematology and Oncology

## 2022-04-23 ENCOUNTER — Other Ambulatory Visit: Payer: Self-pay

## 2022-04-23 VITALS — BP 138/55 | HR 80 | Temp 97.8°F | Resp 18 | Wt 282.5 lb

## 2022-04-23 DIAGNOSIS — C8335 Diffuse large B-cell lymphoma, lymph nodes of inguinal region and lower limb: Secondary | ICD-10-CM

## 2022-04-23 DIAGNOSIS — Z7952 Long term (current) use of systemic steroids: Secondary | ICD-10-CM | POA: Diagnosis not present

## 2022-04-23 DIAGNOSIS — E119 Type 2 diabetes mellitus without complications: Secondary | ICD-10-CM | POA: Diagnosis not present

## 2022-04-23 DIAGNOSIS — Z7902 Long term (current) use of antithrombotics/antiplatelets: Secondary | ICD-10-CM | POA: Insufficient documentation

## 2022-04-23 DIAGNOSIS — C8338 Diffuse large B-cell lymphoma, lymph nodes of multiple sites: Secondary | ICD-10-CM

## 2022-04-23 DIAGNOSIS — Z794 Long term (current) use of insulin: Secondary | ICD-10-CM | POA: Diagnosis not present

## 2022-04-23 DIAGNOSIS — Z79899 Other long term (current) drug therapy: Secondary | ICD-10-CM | POA: Insufficient documentation

## 2022-04-23 DIAGNOSIS — E039 Hypothyroidism, unspecified: Secondary | ICD-10-CM | POA: Insufficient documentation

## 2022-04-23 DIAGNOSIS — D509 Iron deficiency anemia, unspecified: Secondary | ICD-10-CM | POA: Insufficient documentation

## 2022-04-23 DIAGNOSIS — Z7984 Long term (current) use of oral hypoglycemic drugs: Secondary | ICD-10-CM | POA: Insufficient documentation

## 2022-04-23 DIAGNOSIS — Z87891 Personal history of nicotine dependence: Secondary | ICD-10-CM | POA: Insufficient documentation

## 2022-04-23 DIAGNOSIS — I10 Essential (primary) hypertension: Secondary | ICD-10-CM | POA: Insufficient documentation

## 2022-04-23 DIAGNOSIS — C8330 Diffuse large B-cell lymphoma, unspecified site: Secondary | ICD-10-CM | POA: Insufficient documentation

## 2022-04-23 LAB — CMP (CANCER CENTER ONLY)
ALT: 23 U/L (ref 0–44)
AST: 19 U/L (ref 15–41)
Albumin: 3.6 g/dL (ref 3.5–5.0)
Alkaline Phosphatase: 54 U/L (ref 38–126)
Anion gap: 6 (ref 5–15)
BUN: 17 mg/dL (ref 8–23)
CO2: 28 mmol/L (ref 22–32)
Calcium: 8.9 mg/dL (ref 8.9–10.3)
Chloride: 108 mmol/L (ref 98–111)
Creatinine: 1.22 mg/dL (ref 0.61–1.24)
GFR, Estimated: >60 mL/min
Glucose, Bld: 83 mg/dL (ref 70–99)
Potassium: 4 mmol/L (ref 3.5–5.1)
Sodium: 142 mmol/L (ref 135–145)
Total Bilirubin: 1 mg/dL (ref 0.3–1.2)
Total Protein: 7.1 g/dL (ref 6.5–8.1)

## 2022-04-23 LAB — CBC WITH DIFFERENTIAL (CANCER CENTER ONLY)
Abs Immature Granulocytes: 0.01 K/uL (ref 0.00–0.07)
Basophils Absolute: 0 K/uL (ref 0.0–0.1)
Basophils Relative: 1 %
Eosinophils Absolute: 0.3 K/uL (ref 0.0–0.5)
Eosinophils Relative: 5 %
HCT: 37.8 % — ABNORMAL LOW (ref 39.0–52.0)
Hemoglobin: 12.3 g/dL — ABNORMAL LOW (ref 13.0–17.0)
Immature Granulocytes: 0 %
Lymphocytes Relative: 40 %
Lymphs Abs: 2.1 K/uL (ref 0.7–4.0)
MCH: 25.7 pg — ABNORMAL LOW (ref 26.0–34.0)
MCHC: 32.5 g/dL (ref 30.0–36.0)
MCV: 78.9 fL — ABNORMAL LOW (ref 80.0–100.0)
Monocytes Absolute: 0.7 K/uL (ref 0.1–1.0)
Monocytes Relative: 14 %
Neutro Abs: 2.1 K/uL (ref 1.7–7.7)
Neutrophils Relative %: 40 %
Platelet Count: 163 K/uL (ref 150–400)
RBC: 4.79 MIL/uL (ref 4.22–5.81)
RDW: 14.3 % (ref 11.5–15.5)
WBC Count: 5.2 K/uL (ref 4.0–10.5)
nRBC: 0 % (ref 0.0–0.2)

## 2022-04-23 LAB — LACTATE DEHYDROGENASE: LDH: 519 U/L — ABNORMAL HIGH (ref 98–192)

## 2022-04-23 NOTE — Progress Notes (Signed)
[place] [phone]   [phone]  PROGRESS NOTE  Patient Care Team: Curlene Labrum, MD as PCP - General Branch, Alphonse Guild, MD as PCP - Cardiology (Cardiology) Cassandria Anger, MD as Consulting Physician (Endocrinology) Ilean China, RN as [place] History # Diffuse Large B Cell Lymphoma, Stage III 03/16/2022: establish care with Dr. Lorenso Courier in [facility] 04/14/2022: excisional lymph node biopsy of right inguinal node shows DLBCL arising from follicular lymphoma 0000000: PET CT scan showed widespread hypermetabolic lymphadenopathy in the neck, chest, abdomen, and pelvis  Interval History:  Alex Cortez 72 y.o. male with medical history significant for newly diagnosed diffuse large B-cell lymphoma who presents for a follow up visit. The patient's last visit was on 03/16/2022 at which time he established care in the rapid diagnostic clinic. In the interim since the last visit he has completed staging with a PET CT scan and diagnosis has been confirmed with an excisional biopsy.  On exam today Alex Cortez is accompanied by his wife.  He reports he has been well in the interim since her last visit with no major changes in his health.  He denies any fevers, chills, sweats, nausea, vomiting or diarrhea.  He reports the surgical site is healing well.  Overall his health is at the same baseline as it was during our prior visit.  A full 10 point ROS otherwise negative.  The bulk of our discussion focused on the diagnosis of diffuse large B-cell lymphoma, the steps moving forward, and the anticipated treatment.  Details of this conversation are noted below.  MEDICAL HISTORY:  Past Medical History:  Diagnosis Date   CAD (coronary artery disease)    nonobstructive CAD/patent LAD stent site; EF 65%, 08/2011; NSTEMI/DES LAD, 6/20 11   Chest pain    Diabetes mellitus     insulin-dependent   Dyslipidemia    HTN (hypertension)    Hypothyroidism    Myocardial infarction ([place]) 2011   Post MI syndrome ([place]) 08/29/2009   Stroke ([place]) 10/22/2012   Left inferior pons    SURGICAL HISTORY: Past Surgical History:  Procedure Laterality Date   COLONOSCOPY  08/2021   CORONARY ANGIOPLASTY WITH STENT PLACEMENT  2011   LYMPH NODE BIOPSY Right 04/14/2022   Procedure: RIGHT INGUINAL EXCISIONAL LYMPH NODE BIOPSY;  Surgeon: Dwan Bolt, MD;  Location: [facility];  Service: General;  Laterality: Right;    SOCIAL HISTORY: Social History   Socioeconomic History   Marital status: Married    Spouse name: Not on file   Number of children: Not on file   Years of education: Not on file   Highest education level: Not on file  Occupational History   Not on file  Tobacco Use   Smoking status: Former    Packs/day: 0.50    Years: 23.00    Total pack years: 11.50    Types: Cigarettes    Start date: 03/16/1955    Quit date: 03/15/1988    Years since quitting: 34.1   Smokeless tobacco: Never  Vaping Use   Vaping Use: Never used  Substance and Sexual Activity   Alcohol use: No    Alcohol/week: 0.0 standard drinks of alcohol   Drug use: No   Sexual activity: Not on file  Other Topics Concern   Not on file  Social History Narrative   Works at [employer]. Married.    Social Determinants of Health   Financial  Resource Strain: Low Risk  (03/17/2022)   Overall Financial Resource Strain (CARDIA)    Difficulty of Paying Living Expenses: Not hard at all  Food Insecurity: Unknown (12/01/2021)   Hunger Vital Sign    Worried About Running Out of Food in the Last Year: Not on file    Ran Out of Food in the Last Year: Never true  Transportation Needs: No Transportation Needs (04/07/2022)   PRAPARE - Hydrologist (Medical): No    Lack of Transportation (Non-Medical): No  Physical Activity: Not on file  Stress: No Stress Concern Present (04/07/2022)    [place]    Feeling of Stress : Not at all  Social Connections: Unknown (07/31/2017)   Social Connection and Isolation Panel [NHANES]    Frequency of Communication with Friends and Family: Patient refused    Frequency of Social Gatherings with Friends and Family: Patient refused    Attends Religious Services: Patient refused    Active Member of Clubs or Organizations: Patient refused    Attends Archivist Meetings: Patient refused    Marital Status: Patient refused  Intimate Partner Violence: Unknown (07/31/2017)   Humiliation, Afraid, Rape, and Kick questionnaire    Fear of Current or Ex-Partner: Patient refused    Emotionally Abused: Patient refused    Physically Abused: Patient refused    Sexually Abused: Patient refused    FAMILY HISTORY: Family History  Problem Relation Age of Onset   Colon cancer Mother    Hypertension Other        in all family members    ALLERGIES:  is allergic to ace inhibitors and shrimp [shellfish allergy].  MEDICATIONS:  Current Outpatient Medications  Medication Sig Dispense Refill   amLODipine (NORVASC) 10 MG tablet Take 10 mg by mouth daily. for high blood pressure     carvedilol (COREG) 25 MG tablet Take 1 tablet (25 mg total) by mouth 2 (two) times daily. 180 tablet 3   clopidogrel (PLAVIX) 75 MG tablet Take 1 tablet (75 mg total) by mouth daily. 30 tablet 5   Continuous Blood Gluc Receiver (FREESTYLE LIBRE 2 READER) DEVI As directed 1 each 0   Continuous Blood Gluc Sensor (FREESTYLE LIBRE 2 SENSOR) MISC APPLY 1 SENSOR EVERY 14    DAYS 6 each 0   ezetimibe (ZETIA) 10 MG tablet Take 1 tablet (10 mg total) by mouth daily. 30 tablet 6   FARXIGA 5 MG TABS tablet TAKE 1 TABLET BY MOUTH EVERY DAY BEFORE BREAKFAST 30 tablet 2   fluticasone (FLONASE) 50 MCG/ACT nasal spray Place 2 sprays into both nostrils daily as needed for allergies.     insulin regular human CONCENTRATED  (HUMULIN R U-500 KWIKPEN) 500 UNIT/ML KwikPen INJECT AS DIRECTED 80 UNITS WITH BREAKFAST, 80 UNITS WITH LUNCH, AND 80 UNITS WITH SUPPER FOR PRE-MEAL BLOOD GLUCOSE READINGS OF 90MG/DL OR ABOVE 45 mL 0   isosorbide mononitrate (IMDUR) 30 MG 24 hr tablet TAKE 1 TABLET BY MOUTH EVERY DAY 90 tablet 1   levothyroxine (SYNTHROID) 75 MCG tablet TAKE 1 TABLET BY MOUTH EVERY DAY BEFORE BREAKFAST 90 tablet 0   losartan (COZAAR) 50 MG tablet Take 1 tablet (50 mg total) by mouth daily.     metFORMIN (GLUCOPHAGE) 1000 MG tablet Take 1,000 mg by mouth daily with breakfast.  3   nitroGLYCERIN (NITROSTAT) 0.4 MG SL tablet Place 1 tablet (0.4 mg total) under the tongue every 5 (five) minutes  as needed. 25 tablet 3   ondansetron (ZOFRAN) 4 MG tablet Take 1 tablet (4 mg total) by mouth every 6 (six) hours. (Patient taking differently: Take 4 mg by mouth every 6 (six) hours as needed for vomiting or nausea.) 12 tablet 0   ONE TOUCH ULTRA TEST test strip USE TO TEST BLOOD SUGAR FOUR TIMES DAILY E11.65 150 each 5   rosuvastatin (CRESTOR) 40 MG tablet TAKE 1 TABLET BY MOUTH EVERY DAY 90 tablet 2   No current facility-administered medications for this visit.    REVIEW OF SYSTEMS:   Constitutional: ( - ) fevers, ( - )  chills , ( - ) night sweats Eyes: ( - ) blurriness of vision, ( - ) double vision, ( - ) watery eyes Ears, nose, mouth, throat, and face: ( - ) mucositis, ( - ) sore throat Respiratory: ( - ) cough, ( - ) dyspnea, ( - ) wheezes Cardiovascular: ( - ) palpitation, ( - ) chest discomfort, ( - ) lower extremity swelling Gastrointestinal:  ( - ) nausea, ( - ) heartburn, ( - ) change in bowel habits Skin: ( - ) abnormal skin rashes Lymphatics: ( - ) new lymphadenopathy, ( - ) easy bruising Neurological: ( - ) numbness, ( - ) tingling, ( - ) new weaknesses Behavioral/Psych: ( - ) mood change, ( - ) new changes  All other systems were reviewed with the patient and are negative.  PHYSICAL EXAMINATION: ECOG  PERFORMANCE STATUS: 0 - Asymptomatic  Vitals:   04/23/22 1503  BP: (!) 138/55  Pulse: 80  Resp: 18  Temp: 97.8 F (36.6 C)  SpO2: 99%   Filed Weights   04/23/22 1503  Weight: 282 lb 8 oz (128.1 kg)    GENERAL: Well-appearing elderly African-American male alert, no distress and comfortable SKIN: skin color, texture, turgor are normal, no rashes or significant lesions EYES: conjunctiva are pink and non-injected, sclera clear NECK: supple, non-tender LYMPH:   palpable lymphadenopathy in the cervical and axillary nodes.  LUNGS: clear to auscultation and percussion with normal breathing effort HEART: regular rate & rhythm and no murmurs and no lower extremity edema Musculoskeletal: no cyanosis of digits and no clubbing  PSYCH: alert & oriented x 3, fluent speech NEURO: no focal motor/sensory deficits  LABORATORY DATA:  I have reviewed the data as listed    Latest Ref Rng & Units 04/23/2022    2:19 PM 03/16/2022   11:23 AM 02/13/2022    7:41 PM  CBC  WBC 4.0 - 10.5 K/uL 5.2  4.8  12.9   Hemoglobin 13.0 - 17.0 g/dL 12.3  13.2  12.6   Hematocrit 39.0 - 52.0 % 37.8  40.9  38.9   Platelets 150 - 400 K/uL 163  242  218        Latest Ref Rng & Units 04/23/2022    2:19 PM 03/16/2022   11:23 AM 02/13/2022    7:41 PM  CMP  Glucose 70 - 99 mg/dL 83  205  243   BUN 8 - 23 mg/dL 17  16  22   $ Creatinine 0.61 - 1.24 mg/dL 1.22  1.23  1.50   Sodium 135 - 145 mmol/L 142  138  138   Potassium 3.5 - 5.1 mmol/L 4.0  4.9  4.4   Chloride 98 - 111 mmol/L 108  105  107   CO2 22 - 32 mmol/L 28  28  21   $ Calcium 8.9 - 10.3 mg/dL 8.9  9.7  9.0   Total Protein 6.5 - 8.1 g/dL 7.1  8.1  7.4   Total Bilirubin 0.3 - 1.2 mg/dL 1.0  0.8  0.9   Alkaline Phos 38 - 126 U/L 54  55  48   AST 15 - 41 U/L 19  14  25   $ ALT 0 - 44 U/L 23  15  25     $ RADIOGRAPHIC STUDIES: I have personally reviewed the radiological images as listed and agreed with the findings in the report: Diffuse involving the lymph nodes in  the neck, underarms, chest, abdomen and groin. NM PET Image Initial (PI) Skull Base To Thigh  Result Date: 04/21/2022 CLINICAL DATA:  Initial treatment strategy for diffuse large B-cell lymphoma. EXAM: NUCLEAR MEDICINE PET SKULL BASE TO THIGH TECHNIQUE: 30.7 mCi F-18 FDG was injected intravenously. Full-ring PET imaging was performed from the skull base to thigh after the radiotracer. CT data was obtained and used for attenuation correction and anatomic localization. Fasting blood glucose: 119 mg/dl COMPARISON:  CT abdomen/pelvis dated 02/13/2022 FINDINGS: Mediastinal blood pool activity: SUV max 1.9 Liver activity: SUV max 3.8 NECK: Diffuse/widespread bilateral cervical lymphadenopathy. Index 13 mm short axis left level 2 node (series 4/image 32), max SUV 10.0. Incidental CT findings: None. CHEST: Small bilateral lower lobe pulmonary nodules measuring up to 9 mm (series 7/image 81), non FDG avid but possibly beneath the size threshold for PET sensitivity. Mediastinal and bilateral axillary lymphadenopathy. Index 12 mm short axis subcarinal node (series 4/image 74), max SUV 7.6. 10 mm short axis node along the left aspect of the descending thoracic aorta (series 4/image 89), max SUV 9.0. Incidental CT findings: Atherosclerotic calcifications of the arch. Severe three-vessel coronary atherosclerosis. ABDOMEN/PELVIS: Diffusely increased hypermetabolism involving the spleen, max SUV 5.1, without discrete lesion. No focal hypermetabolism in the liver, pancreas, or adrenal glands. Diffuse/multifocal lymphadenopathy in the right jejunal mesentery, retroperitoneum, pelvis, and bilateral inguinal regions. Index nodes include: --3.0 cm short axis left para-aortic node (series 4/image 133), max SUV 13.5 --3.2 cm short axis node in the central right jejunal mesentery (series 4/image 144), max SUV 12.3 --3.1 cm short axis right pelvic sidewall/external iliac nodal mass, max SUV 13.6 --2.9 cm short axis right inguinal node,  max SUV 21.7 Postsurgical changes related to prior right inguinal nodal resection. Incidental CT findings: Atherosclerotic calcifications of the abdominal aorta and branch vessels. 1.8 cm posterior left upper pole renal cyst, likely benign. Thick-walled bladder, although underdistended. SKELETON: No focal hypermetabolic activity to suggest skeletal metastasis. Incidental CT findings: Mild degenerative changes of the visualized thoracolumbar spine. IMPRESSION: Widespread hypermetabolic lymphadenopathy in the neck, chest, abdomen, and pelvis, as described above. Postsurgical changes related to prior right inguinal nodal resection. Small bilateral lower lobe pulmonary nodules, non FDG avid but beneath the size threshold for PET sensitivity. Pulmonary lymphoma is not excluded. Electronically Signed   By: Julian Hy M.D.   On: 04/21/2022 13:15   ECHOCARDIOGRAM COMPLETE  Result Date: 04/19/2022    ECHOCARDIOGRAM REPORT   Patient Name:   Alex Cortez Date of Exam: 04/19/2022 Medical Rec #:  [id number]        Height:       76.0 in Accession #:    [id number]       Weight:       275.6 lb Date of Birth:  13-Dec-1950        BSA:          2.539 m Patient Age:    32 years  BP:           137/67 mmHg Patient Gender: M                HR:           74 bpm. Exam Location:  Outpatient Procedure: 2D Echo, 3D Echo, Cardiac Doppler, Color Doppler and Strain Analysis Indications:    Diffuse large B-cell lymphoma of lymph nodes of inguinal region                 ([place]) [C83.35 (ICD-10-CM)]; Essential (primary) hypertension                 [I10 (ICD-10-CM)]  History:        Patient has prior history of Echocardiogram examinations, most                 recent 02/20/2018. Previous Myocardial Infarction and CAD,                 Stroke; Risk Factors:Hypertension, Diabetes and Dyslipidemia.  Sonographer:    Darlina Sicilian RDCS Referring Phys: [id number] [place]  1. Global longitudinal strain is -16.4% (Borderline)..  Left ventricular ejection fraction, by estimation, is 55 to 60%. The left ventricle has normal function. The left ventricle has no regional wall motion abnormalities. Left ventricular diastolic parameters are consistent with Grade II diastolic dysfunction (pseudonormalization). The average left ventricular global longitudinal strain is -16.4 %. The global longitudinal strain is normal.  2. Right ventricular systolic function is normal. The right ventricular size is normal.  3. The mitral valve is grossly normal. No evidence of mitral valve regurgitation. No evidence of mitral stenosis.  4. The aortic valve is tricuspid. There is mild calcification of the aortic valve. Aortic valve regurgitation is not visualized. Aortic valve sclerosis/calcification is present, without any evidence of aortic stenosis.  5. The inferior vena cava is normal in size with greater than 50% respiratory variability, suggesting right atrial pressure of 3 mmHg. FINDINGS  Left Ventricle: Global longitudinal strain is -16.4% (Borderline). Left ventricular ejection fraction, by estimation, is 55 to 60%. The left ventricle has normal function. The left ventricle has no regional wall motion abnormalities. The average left ventricular global longitudinal strain is -16.4 %. The global longitudinal strain is normal. The left ventricular internal cavity size was normal in size. There is no concentric left ventricular hypertrophy. Left ventricular diastolic parameters are consistent with Grade II diastolic dysfunction (pseudonormalization). Right Ventricle: The right ventricular size is normal. No increase in right ventricular wall thickness. Right ventricular systolic function is normal. Left Atrium: Left atrial size was normal in size. Right Atrium: Right atrial size was normal in size. Pericardium: Trivial pericardial effusion is present. The pericardial effusion is circumferential. Mitral Valve: The mitral valve is grossly normal. There is mild  calcification of the mitral valve leaflet(s). Mild mitral annular calcification. No evidence of mitral valve regurgitation. No evidence of mitral valve stenosis. Tricuspid Valve: The tricuspid valve is grossly normal. Tricuspid valve regurgitation is not demonstrated. No evidence of tricuspid stenosis. Aortic Valve: The aortic valve is tricuspid. There is mild calcification of the aortic valve. Aortic valve regurgitation is not visualized. Aortic valve sclerosis/calcification is present, without any evidence of aortic stenosis. Pulmonic Valve: The pulmonic valve was grossly normal. Pulmonic valve regurgitation is mild. No evidence of pulmonic stenosis. Aorta: The aortic root and ascending aorta are structurally normal, with no evidence of dilitation. Venous: The inferior vena cava is normal in size with greater  than 50% respiratory variability, suggesting right atrial pressure of 3 mmHg. IAS/Shunts: No atrial level shunt detected by color flow Doppler.  LEFT VENTRICLE PLAX 2D LVIDd:         4.90 cm   Diastology LVIDs:         2.80 cm   LV e' medial:    4.78 cm/s LV PW:         1.10 cm   LV E/e' medial:  17.8 LV IVS:        1.10 cm   LV e' lateral:   8.51 cm/s LVOT diam:     2.10 cm   LV E/e' lateral: 10.0 LV SV:         66 LV SV Index:   26        2D Longitudinal Strain LVOT Area:     3.46 cm  2D Strain GLS (A2C):   -16.5 %                          2D Strain GLS (A3C):   -15.6 %                          2D Strain GLS (A4C):   -17.1 %                          2D Strain GLS Avg:     -16.4 %                           3D Volume EF:                          3D EF:        55 %                          LV EDV:       198 ml                          LV ESV:       88 ml                          LV SV:        110 ml RIGHT VENTRICLE RV S prime:     13.10 cm/s TAPSE (M-mode): 2.5 cm LEFT ATRIUM             Index        RIGHT ATRIUM           Index LA diam:        4.00 cm 1.58 cm/m   RA Area:     11.20 cm LA Vol (A2C):   39.7  ml 15.64 ml/m  RA Volume:   24.80 ml  9.77 ml/m LA Vol (A4C):   54.9 ml 21.63 ml/m LA Biplane Vol: 46.8 ml 18.44 ml/m  AORTIC VALVE             PULMONIC VALVE LVOT Vmax:   87.50 cm/s  PR End Diast Vel: 1.61 msec LVOT Vmean:  52.600 cm/s LVOT VTI:    0.190 m  AORTA Ao Root diam: 3.20 cm Ao Asc diam:  3.20 cm MITRAL VALVE MV Area (PHT): 3.79  cm    SHUNTS MV Decel Time: 200 msec    Systemic VTI:  0.19 m MV E velocity: 85.30 cm/s  Systemic Diam: 2.10 cm MV A velocity: 83.10 cm/s MV E/A ratio:  1.03 Dorris Carnes MD Electronically signed by Dorris Carnes MD Signature Date/Time: 04/19/2022/8:56:57 PM    Final     ASSESSMENT & PLAN Alex Cortez 72 y.o. male with medical history significant for newly diagnosed diffuse large B-cell lymphoma who presents for a follow up visit.  At this time findings are most consistent with diffuse large B-cell lymphoma stage III.  The difference tween stage III and stage IV is academic as there is no difference in treatment.  We will plan to proceed with 6 cycles of R-CHOP chemotherapy.  Chemotherapy will be administered every 3 weeks with G-CSF therapy to be provided on day 3.  Patient will be administered chemotherapy of full-strength.  His echocardiogram shows good baseline cardiac function.  Also his labs are strong at baseline.  The patient voices understanding of the chemotherapy and the plan moving forward.  # Diffuse Large B Cell Lymphoma Stage III/IV -- At this time findings are most consistent with a diffuse large B-cell lymphoma arising from follicular lymphoma, stage III/IV.  Would favor stage 3 as there is no clear evidence of organ involvement, may have bone marrow involvement. -- Patient is scheduled for upcoming port placement.  He completed his PET CT scan and additional testing. -- Will proceed with R-CHOP chemotherapy.  Discussed the risks and benefits of this therapy as well as the anticipated side effects and scheduling. -- Labs today show white blood cell  count 5.2, hemoglobin 12.3, MCV 78.9, and platelets of 163  #Supportive Care -- chemotherapy education to be scheduled  -- port placement scheduled.  -- zofran 76m q8H PRN and compazine 174mPO q6H for nausea -- allopurinol 30058mO daily for TLS prophylaxis -- EMLA cream for port -- no pain medication required at this time.    Orders Placed This Encounter  Procedures   Uric acid    Standing Status:   Future    Standing Expiration Date:   05/05/2023   Lactate dehydrogenase (LDH)    Standing Status:   Future    Standing Expiration Date:   05/05/2023   Hepatitis B core antibody, total    Standing Status:   Future    Standing Expiration Date:   05/05/2023   Hepatitis B surface antibody    Standing Status:   Future    Standing Expiration Date:   05/05/2023   Hepatitis B surface antigen    Standing Status:   Future    Standing Expiration Date:   05/05/2023   [facility]    Standing Status:   Future    Standing Expiration Date:   05/06/2023   CMP ([facility])    Standing Status:   Future    Standing Expiration Date:   05/06/2023    All questions were answered. The patient knows to call the clinic with any problems, questions or concerns.  A total of more than 30 minutes were spent on this encounter with face-to-face time and non-face-to-face time, including preparing to see the patient, ordering tests and/or medications, counseling the patient and coordination of care as outlined above.   JohLedell PeoplesD Department of Hematology/Oncology [facility]: [phone]: [phone]: [email or web address]  04/25/2022 5:25 PM

## 2022-04-23 NOTE — Progress Notes (Signed)

## 2022-04-24 ENCOUNTER — Other Ambulatory Visit: Payer: Self-pay | Admitting: Cardiology

## 2022-04-24 ENCOUNTER — Other Ambulatory Visit: Payer: Self-pay

## 2022-04-25 ENCOUNTER — Encounter: Payer: Self-pay | Admitting: Hematology and Oncology

## 2022-04-25 NOTE — Consult Note (Signed)
Chief Complaint: Patient was seen in consultation today for port a cath placement  Referring Physician(s): Dorsey,J  Supervising Physician: Ruthann Cancer  Patient Status: [facility] - Out-pt  History of Present Illness: Alex Cortez is a 72 y.o. male with PMH sig for CAD with prior MI/stenting, DM, HLD, HTN, hypothyroidism, remote stroke 2014 who presents now with newly diagnosed DLBCL arising from follicular lymphoma. He is scheduled today for port a cath placement to assist with treatment.   Past Medical History:  Diagnosis Date   CAD (coronary artery disease)    nonobstructive CAD/patent LAD stent site; EF 65%, 08/2011; NSTEMI/DES LAD, 6/20 11   Chest pain    Diabetes mellitus    insulin-dependent   Dyslipidemia    HTN (hypertension)    Hypothyroidism    Myocardial infarction [facility]) 2011   Post MI syndrome ([place]) 08/29/2009   Stroke ([place]) 10/22/2012   Left inferior pons    Past Surgical History:  Procedure Laterality Date   COLONOSCOPY  08/2021   CORONARY ANGIOPLASTY WITH STENT PLACEMENT  2011   LYMPH NODE BIOPSY Right 04/14/2022   Procedure: RIGHT INGUINAL EXCISIONAL LYMPH NODE BIOPSY;  Surgeon: Dwan Bolt, MD;  Location: [facility] ORS;  Service: General;  Laterality: Right;    Allergies: Ace inhibitors and Shrimp [shellfish allergy]  Medications: Prior to Admission medications   Medication Sig Start Date End Date Taking? Authorizing Provider  amLODipine (NORVASC) 10 MG tablet Take 10 mg by mouth daily. for high blood pressure 10/05/18   [provider]  carvedilol (COREG) 25 MG tablet Take 1 tablet (25 mg total) by mouth 2 (two) times daily. 12/04/21   Arnoldo Lenis, MD  clopidogrel (PLAVIX) 75 MG tablet Take 1 tablet (75 mg total) by mouth daily. 11/28/17   Frann Rider, NP  Continuous Blood Gluc Receiver (FREESTYLE LIBRE 2 READER) DEVI As directed 08/21/21   Cassandria Anger, MD  Continuous Blood Gluc Sensor (FREESTYLE LIBRE 2 SENSOR) MISC APPLY 1  SENSOR EVERY 14    DAYS 04/13/22   Cassandria Anger, MD  ezetimibe (ZETIA) 10 MG tablet Take 1 tablet (10 mg total) by mouth daily. 12/04/21   Arnoldo Lenis, MD  FARXIGA 5 MG TABS tablet TAKE 1 TABLET BY MOUTH EVERY DAY BEFORE BREAKFAST 03/10/22   Cassandria Anger, MD  fluticasone (FLONASE) 50 MCG/ACT nasal spray Place 2 sprays into both nostrils daily as needed for allergies. 05/31/18   [provider]  insulin regular human CONCENTRATED (HUMULIN R U-500 KWIKPEN) 500 UNIT/ML KwikPen INJECT AS DIRECTED 80 UNITS WITH BREAKFAST, 80 UNITS WITH LUNCH, AND 80 UNITS WITH SUPPER FOR PRE-MEAL BLOOD GLUCOSE READINGS OF 90MG/DL OR ABOVE 03/23/22   Cassandria Anger, MD  isosorbide mononitrate (IMDUR) 30 MG 24 hr tablet TAKE 1 TABLET BY MOUTH EVERY DAY 10/01/21   Arnoldo Lenis, MD  levothyroxine (SYNTHROID) 75 MCG tablet TAKE 1 TABLET BY MOUTH EVERY DAY BEFORE BREAKFAST 03/10/22   Cassandria Anger, MD  losartan (COZAAR) 50 MG tablet Take 1 tablet (50 mg total) by mouth daily. 12/11/21   Arnoldo Lenis, MD  metFORMIN (GLUCOPHAGE) 1000 MG tablet Take 1,000 mg by mouth daily with breakfast. 07/21/14   [provider]  nitroGLYCERIN (NITROSTAT) 0.4 MG SL tablet Place 1 tablet (0.4 mg total) under the tongue every 5 (five) minutes as needed. 08/30/11   de Stanford Scotland, MD  ondansetron (ZOFRAN) 4 MG tablet Take 1 tablet (4 mg total) by mouth every  6 (six) hours. Patient taking differently: Take 4 mg by mouth every 6 (six) hours as needed for vomiting or nausea. 02/14/22   Fransico Meadow, MD  ONE TOUCH ULTRA TEST test strip USE TO TEST BLOOD SUGAR FOUR TIMES DAILY E11.65 12/16/15   Cassandria Anger, MD  rosuvastatin (CRESTOR) 40 MG tablet TAKE 1 TABLET BY MOUTH EVERY DAY 10/06/21   Branch, Alphonse Guild, MD     Family History  Problem Relation Age of Onset   Colon cancer Mother    Hypertension Other        in all family members    Social History   Socioeconomic  History   Marital status: Married    Spouse name: Not on file   Number of children: Not on file   Years of education: Not on file   Highest education level: Not on file  Occupational History   Not on file  Tobacco Use   Smoking status: Former    Packs/day: 0.50    Years: 23.00    Total pack years: 11.50    Types: Cigarettes    Start date: 03/16/1955    Quit date: 03/15/1988    Years since quitting: 34.1   Smokeless tobacco: Never  Vaping Use   Vaping Use: Never used  Substance and Sexual Activity   Alcohol use: No    Alcohol/week: 0.0 standard drinks of alcohol   Drug use: No   Sexual activity: Not on file  Other Topics Concern   Not on file  Social History Narrative   Works at [employer]. Married.    Social Determinants of Health   Financial Resource Strain: Low Risk  (03/17/2022)   Overall Financial Resource Strain (CARDIA)    Difficulty of Paying Living Expenses: Not hard at all  Food Insecurity: Unknown (12/01/2021)   Hunger Vital Sign    Worried About Running Out of Food in the Last Year: Not on file    Ran Out of Food in the Last Year: Never true  Transportation Needs: No Transportation Needs (04/07/2022)   PRAPARE - Hydrologist (Medical): No    Lack of Transportation (Non-Medical): No  Physical Activity: Not on file  Stress: No Stress Concern Present (04/07/2022)   [place]    Feeling of Stress : Not at all  Social Connections: Unknown (07/31/2017)   Social Connection and Isolation Panel [NHANES]    Frequency of Communication with Friends and Family: Patient refused    Frequency of Social Gatherings with Friends and Family: Patient refused    Attends Religious Services: Patient refused    Marine scientist or Organizations: Patient refused    Attends Archivist Meetings: Patient refused    Marital Status: Patient refused      Review of Systems denies  fever,HA,CP,dyspnea, cough, abd/back pain,N/V or bleeding  Vital Signs: Vitals:   04/26/22 0949  BP: (!) 140/70  Pulse: 70  Resp: 18  Temp: 98.1 F (36.7 C)  SpO2: 97%      Code Status: FULL CODE   Physical Exam awake/answers questions ok; spouse in room; chest- distant BS bilat; heart- RRR; abd- obese,+BS,NT; bilat LE edema > on right/chronic venous stasis changes; inguinal/cervical adenopathy  Imaging: NM PET Image Initial (PI) Skull Base To Thigh  Result Date: 04/21/2022 CLINICAL DATA:  Initial treatment strategy for diffuse large B-cell lymphoma. EXAM: NUCLEAR MEDICINE PET SKULL BASE TO THIGH TECHNIQUE: 30.7  mCi F-18 FDG was injected intravenously. Full-ring PET imaging was performed from the skull base to thigh after the radiotracer. CT data was obtained and used for attenuation correction and anatomic localization. Fasting blood glucose: 119 mg/dl COMPARISON:  CT abdomen/pelvis dated 02/13/2022 FINDINGS: Mediastinal blood pool activity: SUV max 1.9 Liver activity: SUV max 3.8 NECK: Diffuse/widespread bilateral cervical lymphadenopathy. Index 13 mm short axis left level 2 node (series 4/image 32), max SUV 10.0. Incidental CT findings: None. CHEST: Small bilateral lower lobe pulmonary nodules measuring up to 9 mm (series 7/image 81), non FDG avid but possibly beneath the size threshold for PET sensitivity. Mediastinal and bilateral axillary lymphadenopathy. Index 12 mm short axis subcarinal node (series 4/image 74), max SUV 7.6. 10 mm short axis node along the left aspect of the descending thoracic aorta (series 4/image 89), max SUV 9.0. Incidental CT findings: Atherosclerotic calcifications of the arch. Severe three-vessel coronary atherosclerosis. ABDOMEN/PELVIS: Diffusely increased hypermetabolism involving the spleen, max SUV 5.1, without discrete lesion. No focal hypermetabolism in the liver, pancreas, or adrenal glands. Diffuse/multifocal lymphadenopathy in the right jejunal  mesentery, retroperitoneum, pelvis, and bilateral inguinal regions. Index nodes include: --3.0 cm short axis left para-aortic node (series 4/image 133), max SUV 13.5 --3.2 cm short axis node in the central right jejunal mesentery (series 4/image 144), max SUV 12.3 --3.1 cm short axis right pelvic sidewall/external iliac nodal mass, max SUV 13.6 --2.9 cm short axis right inguinal node, max SUV 21.7 Postsurgical changes related to prior right inguinal nodal resection. Incidental CT findings: Atherosclerotic calcifications of the abdominal aorta and branch vessels. 1.8 cm posterior left upper pole renal cyst, likely benign. Thick-walled bladder, although underdistended. SKELETON: No focal hypermetabolic activity to suggest skeletal metastasis. Incidental CT findings: Mild degenerative changes of the visualized thoracolumbar spine. IMPRESSION: Widespread hypermetabolic lymphadenopathy in the neck, chest, abdomen, and pelvis, as described above. Postsurgical changes related to prior right inguinal nodal resection. Small bilateral lower lobe pulmonary nodules, non FDG avid but beneath the size threshold for PET sensitivity. Pulmonary lymphoma is not excluded. Electronically Signed   By: Julian Hy M.D.   On: 04/21/2022 13:15   ECHOCARDIOGRAM COMPLETE  Result Date: 04/19/2022    ECHOCARDIOGRAM REPORT   Patient Name:   Alex Cortez Date of Exam: 04/19/2022 Medical Rec #:  [id number]        Height:       76.0 in Accession #:    [id number]       Weight:       275.6 lb Date of Birth:  1950/10/17        BSA:          2.539 m Patient Age:    85 years         BP:           137/67 mmHg Patient Gender: M                HR:           74 bpm. Exam Location:  Outpatient Procedure: 2D Echo, 3D Echo, Cardiac Doppler, Color Doppler and Strain Analysis Indications:    Diffuse large B-cell lymphoma of lymph nodes of inguinal region                 ([place]) [C83.35 (ICD-10-CM)]; Essential (primary) hypertension                 [I10  (ICD-10-CM)]  History:        Patient has prior history of  Echocardiogram examinations, most                 recent 02/20/2018. Previous Myocardial Infarction and CAD,                 Stroke; Risk Factors:Hypertension, Diabetes and Dyslipidemia.  Sonographer:    Darlina Sicilian RDCS Referring Phys: [id number] [place]  1. Global longitudinal strain is -16.4% (Borderline).. Left ventricular ejection fraction, by estimation, is 55 to 60%. The left ventricle has normal function. The left ventricle has no regional wall motion abnormalities. Left ventricular diastolic parameters are consistent with Grade II diastolic dysfunction (pseudonormalization). The average left ventricular global longitudinal strain is -16.4 %. The global longitudinal strain is normal.  2. Right ventricular systolic function is normal. The right ventricular size is normal.  3. The mitral valve is grossly normal. No evidence of mitral valve regurgitation. No evidence of mitral stenosis.  4. The aortic valve is tricuspid. There is mild calcification of the aortic valve. Aortic valve regurgitation is not visualized. Aortic valve sclerosis/calcification is present, without any evidence of aortic stenosis.  5. The inferior vena cava is normal in size with greater than 50% respiratory variability, suggesting right atrial pressure of 3 mmHg. FINDINGS  Left Ventricle: Global longitudinal strain is -16.4% (Borderline). Left ventricular ejection fraction, by estimation, is 55 to 60%. The left ventricle has normal function. The left ventricle has no regional wall motion abnormalities. The average left ventricular global longitudinal strain is -16.4 %. The global longitudinal strain is normal. The left ventricular internal cavity size was normal in size. There is no concentric left ventricular hypertrophy. Left ventricular diastolic parameters are consistent with Grade II diastolic dysfunction (pseudonormalization). Right Ventricle: The right  ventricular size is normal. No increase in right ventricular wall thickness. Right ventricular systolic function is normal. Left Atrium: Left atrial size was normal in size. Right Atrium: Right atrial size was normal in size. Pericardium: Trivial pericardial effusion is present. The pericardial effusion is circumferential. Mitral Valve: The mitral valve is grossly normal. There is mild calcification of the mitral valve leaflet(s). Mild mitral annular calcification. No evidence of mitral valve regurgitation. No evidence of mitral valve stenosis. Tricuspid Valve: The tricuspid valve is grossly normal. Tricuspid valve regurgitation is not demonstrated. No evidence of tricuspid stenosis. Aortic Valve: The aortic valve is tricuspid. There is mild calcification of the aortic valve. Aortic valve regurgitation is not visualized. Aortic valve sclerosis/calcification is present, without any evidence of aortic stenosis. Pulmonic Valve: The pulmonic valve was grossly normal. Pulmonic valve regurgitation is mild. No evidence of pulmonic stenosis. Aorta: The aortic root and ascending aorta are structurally normal, with no evidence of dilitation. Venous: The inferior vena cava is normal in size with greater than 50% respiratory variability, suggesting right atrial pressure of 3 mmHg. IAS/Shunts: No atrial level shunt detected by color flow Doppler.  LEFT VENTRICLE PLAX 2D LVIDd:         4.90 cm   Diastology LVIDs:         2.80 cm   LV e' medial:    4.78 cm/s LV PW:         1.10 cm   LV E/e' medial:  17.8 LV IVS:        1.10 cm   LV e' lateral:   8.51 cm/s LVOT diam:     2.10 cm   LV E/e' lateral: 10.0 LV SV:         66 LV SV Index:  26        2D Longitudinal Strain LVOT Area:     3.46 cm  2D Strain GLS (A2C):   -16.5 %                          2D Strain GLS (A3C):   -15.6 %                          2D Strain GLS (A4C):   -17.1 %                          2D Strain GLS Avg:     -16.4 %                           3D Volume EF:                           3D EF:        55 %                          LV EDV:       198 ml                          LV ESV:       88 ml                          LV SV:        110 ml RIGHT VENTRICLE RV S prime:     13.10 cm/s TAPSE (M-mode): 2.5 cm LEFT ATRIUM             Index        RIGHT ATRIUM           Index LA diam:        4.00 cm 1.58 cm/m   RA Area:     11.20 cm LA Vol (A2C):   39.7 ml 15.64 ml/m  RA Volume:   24.80 ml  9.77 ml/m LA Vol (A4C):   54.9 ml 21.63 ml/m LA Biplane Vol: 46.8 ml 18.44 ml/m  AORTIC VALVE             PULMONIC VALVE LVOT Vmax:   87.50 cm/s  PR End Diast Vel: 1.61 msec LVOT Vmean:  52.600 cm/s LVOT VTI:    0.190 m  AORTA Ao Root diam: 3.20 cm Ao Asc diam:  3.20 cm MITRAL VALVE MV Area (PHT): 3.79 cm    SHUNTS MV Decel Time: 200 msec    Systemic VTI:  0.19 m MV E velocity: 85.30 cm/s  Systemic Diam: 2.10 cm MV A velocity: 83.10 cm/s MV E/A ratio:  1.03 Dorris Carnes MD Electronically signed by Dorris Carnes MD Signature Date/Time: 04/19/2022/8:56:57 PM    Final     Labs:  CBC: Recent Labs    02/13/22 1941 03/16/22 1123 04/23/22 1419  WBC 12.9* 4.8 5.2  HGB 12.6* 13.2 12.3*  HCT 38.9* 40.9 37.8*  PLT 218 242 163    COAGS: No results for input(s): "INR", "APTT" in the last 8760 hours.  BMP: Recent Labs    09/11/21 0810 12/26/21 0000 02/13/22 1941 03/16/22 1123 04/23/22 1419  NA 142 141 138  138 142  K 3.9 4.3 4.4 4.9 4.0  CL 104 106 107 105 108  CO2 17* 27* 21* 28 28  GLUCOSE 51*  --  243* 205* 83  BUN 24 20 22 16 17  $ CALCIUM 9.4 9.6 9.0 9.7 8.9  CREATININE 1.23 1.3 1.50* 1.23 1.22  GFRNONAA  --   --  49* >60 >60    LIVER FUNCTION TESTS: Recent Labs    09/11/21 0810 12/26/21 0000 02/13/22 1941 03/16/22 1123 04/23/22 1419  BILITOT 0.3  --  0.9 0.8 1.0  AST 16 13* 25 14* 19  ALT 18 20 25 15 23  $ ALKPHOS 67 52 48 55 54  PROT 8.1  --  7.4 8.1 7.1  ALBUMIN 4.3 4.0 3.5 3.8 3.6    TUMOR MARKERS: No results for input(s): "AFPTM", "CEA", "CA199",  "CHROMGRNA" in the last 8760 hours.  Assessment and Plan: 72 y.o. male with PMH sig for CAD with prior MI/stenting, DM, HLD, HTN, hypothyroidism, remote stroke 2014 who presents now with newly diagnosed DLBCL arising from follicular lymphoma. He is scheduled today for port a cath placement to assist with treatment. Risks and benefits of image guided port-a-catheter placement was discussed with the patient /spouse including, but not limited to bleeding, infection, pneumothorax, or fibrin sheath development and need for additional procedures.  All of the patient's questions were answered, patient is agreeable to proceed. Consent signed and in chart.  Pt last ate at 0745 today  Thank you for this interesting consult.  I greatly enjoyed meeting [employer] and look forward to participating in their care.  A copy of this report was sent to the requesting provider on this date.  Electronically Signed: D. Rowe Robert, PA-C 04/25/2022, 4:14 PM   I spent a total of  25 minutes   in face to face in clinical consultation, greater than 50% of which was counseling/coordinating care for port a cath placement

## 2022-04-26 ENCOUNTER — Ambulatory Visit (HOSPITAL_COMMUNITY)
Admission: RE | Admit: 2022-04-26 | Discharge: 2022-04-26 | Disposition: A | Discharge status: Home / Self Care | Payer: Medicare Other | Source: Ambulatory Visit | Attending: Physician Assistant | Admitting: Physician Assistant

## 2022-04-26 ENCOUNTER — Other Ambulatory Visit: Payer: Self-pay

## 2022-04-26 ENCOUNTER — Encounter (HOSPITAL_COMMUNITY): Payer: Self-pay

## 2022-04-26 DIAGNOSIS — Z87891 Personal history of nicotine dependence: Secondary | ICD-10-CM | POA: Diagnosis not present

## 2022-04-26 DIAGNOSIS — Z7984 Long term (current) use of oral hypoglycemic drugs: Secondary | ICD-10-CM | POA: Diagnosis not present

## 2022-04-26 DIAGNOSIS — Z452 Encounter for adjustment and management of vascular access device: Secondary | ICD-10-CM | POA: Diagnosis not present

## 2022-04-26 DIAGNOSIS — I2510 Atherosclerotic heart disease of native coronary artery without angina pectoris: Secondary | ICD-10-CM | POA: Diagnosis not present

## 2022-04-26 DIAGNOSIS — E119 Type 2 diabetes mellitus without complications: Secondary | ICD-10-CM | POA: Diagnosis not present

## 2022-04-26 DIAGNOSIS — C8330 Diffuse large B-cell lymphoma, unspecified site: Secondary | ICD-10-CM | POA: Diagnosis not present

## 2022-04-26 DIAGNOSIS — E039 Hypothyroidism, unspecified: Secondary | ICD-10-CM | POA: Diagnosis not present

## 2022-04-26 DIAGNOSIS — C8335 Diffuse large B-cell lymphoma, lymph nodes of inguinal region and lower limb: Secondary | ICD-10-CM

## 2022-04-26 DIAGNOSIS — Z955 Presence of coronary angioplasty implant and graft: Secondary | ICD-10-CM | POA: Insufficient documentation

## 2022-04-26 DIAGNOSIS — Z794 Long term (current) use of insulin: Secondary | ICD-10-CM | POA: Diagnosis not present

## 2022-04-26 DIAGNOSIS — I10 Essential (primary) hypertension: Secondary | ICD-10-CM | POA: Insufficient documentation

## 2022-04-26 DIAGNOSIS — E785 Hyperlipidemia, unspecified: Secondary | ICD-10-CM | POA: Diagnosis not present

## 2022-04-26 HISTORY — PX: IR IMAGING GUIDED PORT INSERTION: IMG5740

## 2022-04-26 LAB — GLUCOSE, CAPILLARY: Glucose-Capillary: 144 mg/dL — ABNORMAL HIGH (ref 70–99)

## 2022-04-26 MED ORDER — LIDOCAINE-EPINEPHRINE 1 %-1:100000 IJ SOLN
INTRAMUSCULAR | Status: AC
  Administered 2022-04-26: 20 mL via INTRADERMAL
  Filled 2022-04-26: qty 1

## 2022-04-26 MED ORDER — HEPARIN SOD (PORK) LOCK FLUSH 100 UNIT/ML IV SOLN
INTRAVENOUS | Status: AC
  Administered 2022-04-26: 500 [IU] via INTRAVENOUS
  Filled 2022-04-26: qty 5

## 2022-04-26 MED ORDER — FENTANYL CITRATE (PF) 100 MCG/2ML IJ SOLN
INTRAMUSCULAR | Status: AC
  Filled 2022-04-26: qty 2

## 2022-04-26 MED ORDER — FENTANYL CITRATE (PF) 100 MCG/2ML IJ SOLN
INTRAMUSCULAR | Status: AC | PRN
  Administered 2022-04-26 (×2): 50 ug via INTRAVENOUS

## 2022-04-26 MED ORDER — SODIUM CHLORIDE 0.9 % IV SOLN: INTRAVENOUS | Status: DC

## 2022-04-26 NOTE — Procedures (Signed)
Interventional Radiology Procedure Note ° °Procedure: Single Lumen Power Port Placement   ° °Access:  Right internal jugular vein ° °Findings: Catheter tip positioned at cavoatrial junction. Port is ready for immediate use.  ° °Complications: None ° °EBL: < 10 mL ° °Recommendations:  °- Ok to shower in 24 hours °- Do not submerge for 7 days °- Routine line care  ° ° °Lakeyn Dokken, MD ° ° ° °

## 2022-04-26 NOTE — Discharge Instructions (Signed)
Discharge Instructions:   Please call Interventional Radiology clinic [phone] with any questions or concerns.  You may remove your dressing and shower tomorrow.  Do not use EMLA / Lidocaine cream for 2 weeks post Port Insertion this will remove the surgical glue.  Moderate Conscious Sedation, Adult, Care After This sheet gives you information about how to care for yourself after your procedure. Your health care provider may also give you more specific instructions. If you have problems or questions, contact your health care provider. What can I expect after the procedure? After the procedure, it is common to have: Sleepiness for several hours. Impaired judgment for several hours. Difficulty with balance. Vomiting if you eat too soon. Follow these instructions at home: For the time period you were told by your health care provider: Rest. Do not participate in activities where you could fall or become injured. Do not drive or use machinery. Do not drink alcohol. Do not take sleeping pills or medicines that cause drowsiness. Do not make important decisions or sign legal documents. Do not take care of children on your own. Eating and drinking  Follow the diet recommended by your health care provider. Drink enough fluid to keep your urine pale yellow. If you vomit: Drink water, juice, or soup when you can drink without vomiting. Make sure you have little or no nausea before eating solid foods. General instructions Take over-the-counter and prescription medicines only as told by your health care provider. Have a responsible adult stay with you for the time you are told. It is important to have someone help care for you until you are awake and alert. Do not smoke. Keep all follow-up visits as told by your health care provider. This is important. Contact a health care provider if: You are still sleepy or having trouble with balance after 24 hours. You feel light-headed. You keep  feeling nauseous or you keep vomiting. You develop a rash. You have a fever. You have redness or swelling around the IV site. Get help right away if: You have trouble breathing. You have new-onset confusion at home. Summary After the procedure, it is common to feel sleepy, have impaired judgment, or feel nauseous if you eat too soon. Rest after you get home. Know the things you should not do after the procedure. Follow the diet recommended by your health care provider and drink enough fluid to keep your urine pale yellow. Get help right away if you have trouble breathing or new-onset confusion at home. This information is not intended to replace advice given to you by your health care provider. Make sure you discuss any questions you have with your health care provider. Document Revised: 06/29/2019 Document Reviewed: 01/25/2019 Elsevier Patient Education  [place] Insertion, Care After The following information offers guidance on how to care for yourself after your procedure. Your health care provider may also give you more specific instructions. If you have problems or questions, contact your health care provider. What can I expect after the procedure? After the procedure, it is common to have: Discomfort at the port insertion site. Bruising on the skin over the port. This should improve over 3-4 days. Follow these instructions at home: [facility] care After your port is placed, you will get a manufacturer's information card. The card has information about your port. Keep this card with you at all times. Take care of the port as told by your health care provider. Ask your health care provider if you  or a family member can get training for taking care of the port at home. A home health care nurse will be be available to help care for the port. Make sure to remember what type of port you have. Incision care     Follow instructions from your health care provider  about how to take care of your port insertion site. Make sure you: Wash your hands with soap and water for at least 20 seconds before and after you change your bandage (dressing). If soap and water are not available, use hand sanitizer. Change your dressing as told by your health care provider. Leave stitches (sutures), skin glue, or adhesive strips in place. These skin closures may need to stay in place for 2 weeks or longer. If adhesive strip edges start to loosen and curl up, you may trim the loose edges. Do not remove adhesive strips completely unless your health care provider tells you to do that. Check your port insertion site every day for signs of infection. Check for: Redness, swelling, or pain. Fluid or blood. Warmth. Pus or a bad smell. Activity Return to your normal activities as told by your health care provider. Ask your health care provider what activities are safe for you. You may have to avoid lifting. Ask your health care provider how much you can safely lift. General instructions Take over-the-counter and prescription medicines only as told by your health care provider. Do not take baths, swim, or use a hot tub until your health care provider approves. Ask your health care provider if you may take showers. You may only be allowed to take sponge baths. If you were given a sedative during the procedure, it can affect you for several hours. Do not drive or operate machinery until your health care provider says that it is safe. Wear a medical alert bracelet in case of an emergency. This will tell any health care providers that you have a port. Keep all follow-up visits. This is important. Contact a health care provider if: You cannot flush your port with saline as directed, or you cannot draw blood from the port. You have a fever or chills. You have redness, swelling, or pain around your port insertion site. You have fluid or blood coming from your port insertion site. Your port  insertion site feels warm to the touch. You have pus or a bad smell coming from the port insertion site. Get help right away if: You have chest pain or shortness of breath. You have bleeding from your port that you cannot control. These symptoms may be an emergency. Get help right away. Call 911. Do not wait to see if the symptoms will go away. Do not drive yourself to the hospital. Summary Take care of the port as told by your health care provider. Keep the manufacturer's information card with you at all times. Change your dressing as told by your health care provider. Contact a health care provider if you have a fever or chills or if you have redness, swelling, or pain around your port insertion site. Keep all follow-up visits. This information is not intended to replace advice given to you by your health care provider. Make sure you discuss any questions you have with your health care provider. Document Revised: 09/02/2020 Document Reviewed: 09/02/2020 Elsevier Patient Education  [place].

## 2022-04-26 NOTE — Progress Notes (Signed)
1144 Had to re-enter check- list information computer went down information was dropped.

## 2022-04-27 ENCOUNTER — Telehealth: Payer: Self-pay | Admitting: Hematology and Oncology

## 2022-04-27 NOTE — Telephone Encounter (Signed)
Called patient per 2/9 los notes to schedule chemo education. Left voicemail with new appointment information and contact details. Will f/u before EOD.

## 2022-04-28 ENCOUNTER — Telehealth: Payer: Self-pay | Admitting: Hematology and Oncology

## 2022-04-29 ENCOUNTER — Other Ambulatory Visit: Payer: Self-pay | Admitting: Hematology and Oncology

## 2022-04-29 ENCOUNTER — Inpatient Hospital Stay: Payer: Medicare Other

## 2022-04-29 MED ORDER — ONDANSETRON HCL 8 MG PO TABS: 8.0000 mg | ORAL_TABLET | Freq: Three times a day (TID) | ORAL | 0 refills | Status: DC | PRN

## 2022-04-29 MED ORDER — LIDOCAINE-PRILOCAINE 2.5-2.5 % EX CREA: 1.0000 | TOPICAL_CREAM | CUTANEOUS | 0 refills | Status: DC | PRN

## 2022-04-29 MED ORDER — ALLOPURINOL 300 MG PO TABS: 300.0000 mg | ORAL_TABLET | Freq: Every day | ORAL | 1 refills | Status: DC

## 2022-04-29 MED ORDER — PROCHLORPERAZINE MALEATE 10 MG PO TABS: 10.0000 mg | ORAL_TABLET | Freq: Four times a day (QID) | ORAL | 0 refills | Status: DC | PRN

## 2022-04-29 MED ORDER — PREDNISONE 20 MG PO TABS: 60.0000 mg | ORAL_TABLET | Freq: Every day | ORAL | 5 refills | Status: DC

## 2022-04-29 NOTE — Progress Notes (Signed)
Pharmacist Chemotherapy Monitoring - Initial Assessment    Anticipated start date: 05/06/22   The following has been reviewed per standard work regarding the patient's treatment regimen: The patient's diagnosis, treatment plan and drug doses, and organ/hematologic function Lab orders and baseline tests specific to treatment regimen  The treatment plan start date, drug sequencing, and pre-medications Prior authorization status  Patient's documented medication list, including drug-drug interaction screen and prescriptions for anti-emetics and supportive care specific to the treatment regimen The drug concentrations, fluid compatibility, administration routes, and timing of the medications to be used The patient's access for treatment and lifetime cumulative dose history, if applicable  The patient's medication allergies and previous infusion related reactions, if applicable   Changes made to treatment plan:  N/A  Follow up needed:  Hep B panel drawn day of treatment   Judge Stall, [facility], 04/29/2022  12:37 PM

## 2022-05-03 ENCOUNTER — Encounter: Payer: Self-pay | Admitting: "Endocrinology

## 2022-05-03 ENCOUNTER — Ambulatory Visit (INDEPENDENT_AMBULATORY_CARE_PROVIDER_SITE_OTHER): Payer: Medicare Other | Admitting: "Endocrinology

## 2022-05-03 VITALS — BP 106/56 | HR 84 | Ht 75.0 in | Wt 282.0 lb

## 2022-05-03 DIAGNOSIS — E782 Mixed hyperlipidemia: Secondary | ICD-10-CM

## 2022-05-03 DIAGNOSIS — Z6835 Body mass index (BMI) 35.0-35.9, adult: Secondary | ICD-10-CM

## 2022-05-03 DIAGNOSIS — E039 Hypothyroidism, unspecified: Secondary | ICD-10-CM | POA: Diagnosis not present

## 2022-05-03 DIAGNOSIS — E1159 Type 2 diabetes mellitus with other circulatory complications: Secondary | ICD-10-CM | POA: Diagnosis not present

## 2022-05-03 DIAGNOSIS — I10 Essential (primary) hypertension: Secondary | ICD-10-CM | POA: Diagnosis not present

## 2022-05-03 MED ORDER — HUMULIN R U-500 KWIKPEN 500 UNIT/ML ~~LOC~~ SOPN: PEN_INJECTOR | SUBCUTANEOUS | 0 refills | Status: DC

## 2022-05-03 MED ORDER — METFORMIN HCL 500 MG PO TABS: 500.0000 mg | ORAL_TABLET | Freq: Every day | ORAL | 1 refills | Status: DC

## 2022-05-03 NOTE — Patient Instructions (Signed)

## 2022-05-03 NOTE — Progress Notes (Signed)
05/03/2022  Endocrinology follow-up note   Subjective:    Patient ID: Alex Cortez, male    DOB: 08/06/1950, PCP Curlene Labrum, MD   Past Medical History:  Diagnosis Date   CAD (coronary artery disease)    nonobstructive CAD/patent LAD stent site; EF 65%, 08/2011; NSTEMI/DES LAD, 6/20 11   Chest pain    Diabetes mellitus    insulin-dependent   Dyslipidemia    HTN (hypertension)    Hypothyroidism    Myocardial infarction ([place]) 2011   Post MI syndrome ([place]) 08/29/2009   Stroke ([place]) 10/22/2012   Left inferior pons   Past Surgical History:  Procedure Laterality Date   COLONOSCOPY  08/2021   CORONARY ANGIOPLASTY WITH STENT PLACEMENT  2011   IR IMAGING GUIDED PORT INSERTION  04/26/2022   LYMPH NODE BIOPSY Right 04/14/2022   Procedure: RIGHT INGUINAL EXCISIONAL LYMPH NODE BIOPSY;  Surgeon: Dwan Bolt, MD;  Location: [facility];  Service: General;  Laterality: Right;   Social History   Socioeconomic History   Marital status: Married    Spouse name: Not on file   Number of children: Not on file   Years of education: Not on file   Highest education level: Not on file  Occupational History   Not on file  Tobacco Use   Smoking status: Former    Packs/day: 0.50    Years: 23.00    Total pack years: 11.50    Types: Cigarettes    Start date: 03/16/1955    Quit date: 03/15/1988    Years since quitting: 34.1   Smokeless tobacco: Never  Vaping Use   Vaping Use: Never used  Substance and Sexual Activity   Alcohol use: No    Alcohol/week: 0.0 standard drinks of alcohol   Drug use: No   Sexual activity: Not on file  Other Topics Concern   Not on file  Social History Narrative   Works at [employer]. Married.    Social Determinants of Health   Financial Resource Strain: Low Risk  (03/17/2022)   Overall Financial Resource Strain (CARDIA)    Difficulty of Paying Living Expenses: Not hard at all  Food Insecurity: Unknown (12/01/2021)   Hunger Vital Sign    Worried About  Running Out of Food in the Last Year: Not on file    Ran Out of Food in the Last Year: Never true  Transportation Needs: No Transportation Needs (04/07/2022)   PRAPARE - Hydrologist (Medical): No    Lack of Transportation (Non-Medical): No  Physical Activity: Not on file  Stress: No Stress Concern Present (04/07/2022)   [place]    Feeling of Stress : Not at all  Social Connections: Unknown (07/31/2017)   Social Connection and Isolation Panel [NHANES]    Frequency of Communication with Friends and Family: Patient refused    Frequency of Social Gatherings with Friends and Family: Patient refused    Attends Religious Services: Patient refused    Active Member of Clubs or Organizations: Patient refused    Attends Archivist Meetings: Patient refused    Marital Status: Patient refused   Outpatient Encounter Medications as of 05/03/2022  Medication Sig   allopurinol (ZYLOPRIM) 300 MG tablet Take 1 tablet (300 mg total) by mouth daily.   lidocaine-prilocaine (EMLA) cream Apply 1 Application topically as needed.   ondansetron (ZOFRAN) 8 MG tablet Take 1 tablet (8 mg total) by mouth  every 8 (eight) hours as needed.   predniSONE (DELTASONE) 20 MG tablet Take 3 tablets (60 mg total) by mouth daily with breakfast. Take on Day 1 of chemotherapy and continue until Day 5. Repeat with each chemotherapy treatment.   prochlorperazine (COMPAZINE) 10 MG tablet Take 1 tablet (10 mg total) by mouth every 6 (six) hours as needed for nausea or vomiting.   amLODipine (NORVASC) 10 MG tablet Take 10 mg by mouth daily. for high blood pressure   carvedilol (COREG) 25 MG tablet Take 1 tablet (25 mg total) by mouth 2 (two) times daily.   clopidogrel (PLAVIX) 75 MG tablet Take 1 tablet (75 mg total) by mouth daily.   Continuous Blood Gluc Receiver (FREESTYLE LIBRE 2 READER) DEVI As directed   Continuous Blood Gluc  Sensor (FREESTYLE LIBRE 2 SENSOR) MISC APPLY 1 SENSOR EVERY 14    DAYS   ezetimibe (ZETIA) 10 MG tablet TAKE 1 TABLET BY MOUTH EVERY DAY   FARXIGA 5 MG TABS tablet TAKE 1 TABLET BY MOUTH EVERY DAY BEFORE BREAKFAST   fluticasone (FLONASE) 50 MCG/ACT nasal spray Place 2 sprays into both nostrils daily as needed for allergies.   insulin regular human CONCENTRATED (HUMULIN R U-500 KWIKPEN) 500 UNIT/ML KwikPen INJECT AS DIRECTED 70 UNITS WITH BREAKFAST, 70 UNITS WITH LUNCH, AND 70 UNITS WITH SUPPER FOR PRE-MEAL BLOOD GLUCOSE READINGS OF 90MG/DL OR ABOVE   isosorbide mononitrate (IMDUR) 30 MG 24 hr tablet TAKE 1 TABLET BY MOUTH EVERY DAY   levothyroxine (SYNTHROID) 75 MCG tablet TAKE 1 TABLET BY MOUTH EVERY DAY BEFORE BREAKFAST   losartan (COZAAR) 50 MG tablet Take 1 tablet (50 mg total) by mouth daily.   metFORMIN (GLUCOPHAGE) 500 MG tablet Take 1 tablet (500 mg total) by mouth daily with breakfast.   nitroGLYCERIN (NITROSTAT) 0.4 MG SL tablet Place 1 tablet (0.4 mg total) under the tongue every 5 (five) minutes as needed.   ONE TOUCH ULTRA TEST test strip USE TO TEST BLOOD SUGAR FOUR TIMES DAILY E11.65   rosuvastatin (CRESTOR) 40 MG tablet TAKE 1 TABLET BY MOUTH EVERY DAY   [DISCONTINUED] insulin regular human CONCENTRATED (HUMULIN R U-500 KWIKPEN) 500 UNIT/ML KwikPen INJECT AS DIRECTED 80 UNITS WITH BREAKFAST, 80 UNITS WITH LUNCH, AND 80 UNITS WITH SUPPER FOR PRE-MEAL BLOOD GLUCOSE READINGS OF 90MG/DL OR ABOVE   [DISCONTINUED] metFORMIN (GLUCOPHAGE) 1000 MG tablet Take 500 mg by mouth daily with breakfast.   No facility-administered encounter medications on file as of 05/03/2022.   ALLERGIES: Allergies  Allergen Reactions   Ace Inhibitors Swelling    Angioedema   Shrimp [Shellfish Allergy] Hives and Swelling   VACCINATION STATUS: Immunization History  Administered Date(s) Administered   Moderna Sars-Covid-2 Vaccination 04/19/2019, 05/17/2019     Diabetes He presents for his follow-up  diabetic visit. He has type 2 diabetes mellitus. Onset time: He was diagnosed at approximate age of 63 years. His disease course has been improving. There are no hypoglycemic associated symptoms. Pertinent negatives for hypoglycemia include no confusion, headaches, pallor or seizures. Pertinent negatives for diabetes include no chest pain, no fatigue, no polydipsia, no polyphagia, no polyuria and no weakness. There are no hypoglycemic complications. Symptoms are improving. Diabetic complications include a CVA, heart disease and nephropathy. Risk factors for coronary artery disease include diabetes mellitus, dyslipidemia, hypertension, male sex, obesity, sedentary lifestyle and tobacco exposure. Current diabetic treatment includes intensive insulin program. He is compliant with treatment most of the time. His weight is fluctuating minimally. He is following a generally  unhealthy (He consumes a large quantity of soda.) diet. When asked about meal planning, he reported none. He has had a previous visit with a dietitian. He never participates in exercise. His home blood glucose trend is decreasing steadily. His breakfast blood glucose range is generally 140-180 mg/dl. His lunch blood glucose range is generally 140-180 mg/dl. His dinner blood glucose range is generally 140-180 mg/dl. His bedtime blood glucose range is generally 140-180 mg/dl. His overall blood glucose range is 140-180 mg/dl. (Alex Cortez presents with his CGM device.  His device was downloaded and analyzed.  Over the last 14 days, he has 67% time in range, 21% level 1 hyperglycemia, 10% level 2 hyperglycemia.  His previsit labs show A1c of 8.1%, improving from 9.2%.  He did not have significant hypoglycemia.   His recent average blood glucose is 158 mg per DL for the last 14 days.     ) An ACE inhibitor/angiotensin II receptor blocker is being taken. Eye exam is current.  Hyperlipidemia This is a chronic problem. The current episode started more than  1 year ago. The problem is controlled. Exacerbating diseases include chronic renal disease, diabetes and obesity. Pertinent negatives include no chest pain, leg pain, myalgias or shortness of breath. Current antihyperlipidemic treatment includes statins. Risk factors for coronary artery disease include diabetes mellitus, dyslipidemia, hypertension, male sex, obesity and a sedentary lifestyle.  Hypertension This is a chronic problem. The current episode started more than 1 year ago. The problem is controlled. Pertinent negatives include no chest pain, headaches, neck pain, palpitations or shortness of breath. Risk factors for coronary artery disease include obesity, male gender, dyslipidemia, diabetes mellitus, sedentary lifestyle and family history. Past treatments include ACE inhibitors. Hypertensive end-organ damage includes kidney disease and CVA. Identifiable causes of hypertension include chronic renal disease.     Review of systems  Constitutional: + Gaining weight,  current  Body mass index is 35.25 kg/m. , no fatigue, no subjective hyperthermia, no subjective hypothermia    Objective:    BP (!) 106/56   Pulse 84   Ht 6' 3"$  (1.905 m)   Wt 282 lb (127.9 kg)   BMI 35.25 kg/m   Wt Readings from Last 3 Encounters:  05/03/22 282 lb (127.9 kg)  04/26/22 282 lb 6.6 oz (128.1 kg)  04/23/22 282 lb 8 oz (128.1 kg)      Physical Exam- Limited  Constitutional:  Body mass index is 35.25 kg/m. , not in acute distress, normal state of mind Eyes:  EOMI, no exophthalmos Neck: Supple  Respiratory: Adequate breathing efforts Musculoskeletal: no gross deformities, strength intact in all four extremities, no gross restriction of joint movements Skin:  no rashes, no hyperemia, + lipodystrophy on abdominal skin Neurological: no tremor with outstretched hands       Latest Ref Rng & Units 04/23/2022    2:19 PM 03/16/2022   11:23 AM 02/13/2022    7:41 PM  CMP  Glucose 70 - 99 mg/dL 83  205   243   BUN 8 - 23 mg/dL 17  16  22   $ Creatinine 0.61 - 1.24 mg/dL 1.22  1.23  1.50   Sodium 135 - 145 mmol/L 142  138  138   Potassium 3.5 - 5.1 mmol/L 4.0  4.9  4.4   Chloride 98 - 111 mmol/L 108  105  107   CO2 22 - 32 mmol/L 28  28  21   $ Calcium 8.9 - 10.3 mg/dL 8.9  9.7  9.0  Total Protein 6.5 - 8.1 g/dL 7.1  8.1  7.4   Total Bilirubin 0.3 - 1.2 mg/dL 1.0  0.8  0.9   Alkaline Phos 38 - 126 U/L 54  55  48   AST 15 - 41 U/L 19  14  25   $ ALT 0 - 44 U/L 23  15  25     $ Recent Results (from the past 2160 hour(s))  Lipase, blood     Status: None   Collection Time: 02/13/22  7:41 PM  Result Value Ref Range   Lipase 31 11 - 51 U/L    Comment: Performed at [facility], [address], [place], [place] [address]  Comprehensive metabolic panel     Status: Abnormal   Collection Time: 02/13/22  7:41 PM  Result Value Ref Range   Sodium 138 135 - 145 mmol/L   Potassium 4.4 3.5 - 5.1 mmol/L   Chloride 107 98 - 111 mmol/L   CO2 21 (L) 22 - 32 mmol/L   Glucose, Bld 243 (H) 70 - 99 mg/dL    Comment: Glucose reference range applies only to samples taken after fasting for at least 8 hours.   BUN 22 8 - 23 mg/dL   Creatinine, Ser 1.50 (H) 0.61 - 1.24 mg/dL   Calcium 9.0 8.9 - 10.3 mg/dL   Total Protein 7.4 6.5 - 8.1 g/dL   Albumin 3.5 3.5 - 5.0 g/dL   AST 25 15 - 41 U/L   ALT 25 0 - 44 U/L   Alkaline Phosphatase 48 38 - 126 U/L   Total Bilirubin 0.9 0.3 - 1.2 mg/dL   GFR, Estimated 49 (L) >60 mL/min    Comment: (NOTE) Calculated using the CKD-EPI Creatinine Equation (2021)    Anion gap 10 5 - 15    Comment: Performed at [facility], [address], [place], [place] [address]  CBC     Status: Abnormal   Collection Time: 02/13/22  7:41 PM  Result Value Ref Range   WBC 12.9 (H) 4.0 - 10.5 K/uL   RBC 4.86 4.22 - 5.81 MIL/uL   Hemoglobin 12.6 (L) 13.0 - 17.0 g/dL   HCT 38.9 (L) 39.0 - 52.0 %   MCV 80.0 80.0 - 100.0 fL   MCH 25.9 (L) 26.0 - 34.0 pg   MCHC 32.4 30.0 - 36.0 g/dL   RDW  14.1 11.5 - 15.5 %   Platelets 218 150 - 400 K/uL   nRBC 0.0 0.0 - 0.2 %    Comment: Performed at [facility], [address], [place], [place] [address]  Troponin I (High Sensitivity)     Status: None   Collection Time: 02/13/22  7:41 PM  Result Value Ref Range   Troponin I (High Sensitivity) 9 <18 ng/L    Comment: (NOTE) Elevated high sensitivity troponin I (hsTnI) values and significant  changes across serial measurements may suggest ACS but many other  chronic and acute conditions are known to elevate hsTnI results.  Refer to the "Links" section for chest pain algorithms and additional  guidance. Performed at [facility], [address] [place], [place] [address]   Resp Panel by RT-PCR (Flu A&B, Covid) Anterior Nasal Swab     Status: None   Collection Time: 02/13/22  7:59 PM   Specimen: Anterior Nasal Swab  Result Value Ref Range   SARS Coronavirus 2 by RT PCR NEGATIVE NEGATIVE    Comment: (NOTE) SARS-CoV-2 target nucleic acids are NOT DETECTED.  The SARS-CoV-2  RNA is generally detectable in upper respiratory specimens during the acute phase of infection. The lowest concentration of SARS-CoV-2 viral copies this assay can detect is 138 copies/mL. A negative result does not preclude SARS-Cov-2 infection and should not be used as the sole basis for treatment or other patient management decisions. A negative result may occur with  improper specimen collection/handling, submission of specimen other than nasopharyngeal swab, presence of viral mutation(s) within the areas targeted by this assay, and inadequate number of viral copies(<138 copies/mL). A negative result must be combined with clinical observations, patient history, and epidemiological information. The expected result is Negative.  Fact Sheet for Patients:  EntrepreneurPulse.com.au  Fact Sheet for Healthcare Providers:  IncredibleEmployment.be  This test is no t yet approved or  cleared by the [place] FDA and  has been authorized for detection and/or diagnosis of SARS-CoV-2 by FDA under an Emergency Use Authorization (EUA). This EUA will remain  in effect (meaning this test can be used) for the duration of the COVID-19 declaration under Section 564(b)(1) of the Act, 21 U.S.C.section 360bbb-3(b)(1), unless the authorization is terminated  or revoked sooner.       Influenza A by PCR NEGATIVE NEGATIVE   Influenza B by PCR NEGATIVE NEGATIVE    Comment: (NOTE) The Xpert Xpress SARS-CoV-2/FLU/RSV plus assay is intended as an aid in the diagnosis of influenza from Nasopharyngeal swab specimens and should not be used as a sole basis for treatment. Nasal washings and aspirates are unacceptable for Xpert Xpress SARS-CoV-2/FLU/RSV testing.  Fact Sheet for Patients: EntrepreneurPulse.com.au  Fact Sheet for Healthcare Providers: IncredibleEmployment.be  This test is not yet approved or cleared by the [place] FDA and has been authorized for detection and/or diagnosis of SARS-CoV-2 by FDA under an Emergency Use Authorization (EUA). This EUA will remain in effect (meaning this test can be used) for the duration of the COVID-19 declaration under Section 564(b)(1) of the Act, 21 U.S.C. section 360bbb-3(b)(1), unless the authorization is terminated or revoked.  Performed at [facility], [address], [place], [place] [address]   Urinalysis, Routine w reflex microscopic Urine, Clean Catch     Status: Abnormal   Collection Time: 02/13/22  9:00 PM  Result Value Ref Range   Color, Urine YELLOW YELLOW   APPearance CLEAR CLEAR   Specific Gravity, Urine 1.025 1.005 - 1.030   pH 5.0 5.0 - 8.0   Glucose, UA >=500 (A) NEGATIVE mg/dL   Hgb urine dipstick NEGATIVE NEGATIVE   Bilirubin Urine NEGATIVE NEGATIVE   Ketones, ur NEGATIVE NEGATIVE mg/dL   Protein, ur 30 (A) NEGATIVE mg/dL   Nitrite NEGATIVE NEGATIVE   Leukocytes,Ua  TRACE (A) NEGATIVE   RBC / HPF 0-5 0 - 5 RBC/hpf   WBC, UA 0-5 0 - 5 WBC/hpf   Bacteria, UA NONE SEEN NONE SEEN   Squamous Epithelial / HPF 0-5 0 - 5   Mucus PRESENT     Comment: Performed at [facility], [address] [place], [place] [address]  Troponin I (High Sensitivity)     Status: None   Collection Time: 02/13/22 11:40 PM  Result Value Ref Range   Troponin I (High Sensitivity) 16 <18 ng/L    Comment: (NOTE) Elevated high sensitivity troponin I (hsTnI) values and significant  changes across serial measurements may suggest ACS but many other  chronic and acute conditions are known to elevate hsTnI results.  Refer to the "Links" section for chest pain algorithms and additional  guidance. Performed at [facility], [address], [place], [place] [address]   Surgical pathology     Status: None   Collection Time: 03/16/22 12:00 AM  Result Value Ref Range   SURGICAL PATHOLOGY      Surgical Pathology CASE: [id number] PATIENT: Alex Cortez Flow Pathology Report     Clinical history: Lymphadenopathy     DIAGNOSIS:  -Predominance of T cells with relative abundance of CD8 positive cells -No monoclonal B-cell population identified -See comment  COMMENT:  Flow cytometric analysis of the lymphoid population representing 33% of all white blood cells shows predominance of T cells expressing pan T-cell antigens but with relative abundance of CD8 positive cells and reversal of the CD4:CD8 ratio.  CD8 positive cells in particular show relatively dim expression for CD5 associated with CD56 expression.  The findings are atypical and a lymphoproliferative disorder is not excluded.  In the presence of lymphadenopathy, tissue studies are strongly recommended.   GATING AND PHENOTYPIC ANALYSIS:  Gated population: Flow cytometric immunophenotyping is performed using antibodies to the antigens listed in the table below. Electronic gates are pla ced around a cell cluster  displaying light scatter properties corresponding to: lymphocytes  Abnormal Cells in gated population: See comment  Phenotype of Abnormal Cells: See comment                       Lymphoid Antigens       Myeloid Antigens Miscellaneous CD2  tested    CD10 tested    CD11b     ND   CD45 tested CD3  tested    CD19 tested    CD11c     ND   HLA-Dr    ND CD4  tested    CD20 tested    CD13 ND   CD34 tested CD5  tested    CD22 ND   CD14 ND   CD38 tested CD7  tested    CD79b     ND   CD15 ND   CD138     ND CD8  tested    CD103     ND   CD16 ND   TdT  ND CD25 ND   CD200     tested    CD33 ND   CD123     ND TCRab     ND   sKappa    tested    CD64 ND   CD41 ND TCRgd     tested    sLambda   tested    CD117     ND   CD61 ND CD56 tested    cKappa    ND   MPO  ND   CD71 ND CD57 ND   cLambda   ND        [id number]      GROSS DESCRIPTION:  One lavender top tube from [facility] for lymphoma testing.    Final Diagnosis performed by Emilie Rutter, MD.   Electronically signed 03/16/2022 Technical and / or Professional components performed at [facility] [address] [place], [place] [address].  The above tests were developed and their performance characteristics determined by the [facility] system for the physical and immunophenotypic characterization of cell populations. They have not been cleared by the U.S. Food and Drug administration. The  FDA has determined that such clearance or approval is not necessary. This test is used for clinical purposes. It should not be  regarded as investigational or for research  C-reactive protein     Status: Abnormal   Collection Time: 03/16/22 11:23 AM  Result Value Ref Range   CRP 2.9 (H) <1.0 mg/dL    Comment: Performed at [facility] [address] [place], [place] [address]  Sedimentation rate     Status: Abnormal   Collection Time: 03/16/22 11:23 AM  Result Value Ref Range   Sed Rate 42 (H) 0 - 16 mm/hr     Comment: Performed at [facility], [place] [address], [place], [place] [address]  Prostate-Specific AG, Serum     Status: None   Collection Time: 03/16/22 11:23 AM  Result Value Ref Range   Prostate Specific Ag, Serum 1.5 0.0 - 4.0 ng/mL    Comment: (NOTE) Roche ECLIA methodology. According to the American Urological Association, Serum PSA should decrease and remain at undetectable levels after radical prostatectomy. The AUA defines biochemical recurrence as an initial PSA value 0.2 ng/mL or greater followed by a subsequent confirmatory PSA value 0.2 ng/mL or greater. Values obtained with different assay methods or kits cannot be used interchangeably. Results cannot be interpreted as absolute evidence of the presence or absence of malignant disease. Performed At: [facility] [address] [place], [place] [id number] Rush Farmer MD [id number]   Flow Cytometry, Peripheral Blood (Oncology)     Status: None   Collection Time: 03/16/22 11:23 AM  Result Value Ref Range   Flow Cytometry See Scanned report in [place]: Performed at [facility], [place] [address] [place], [place] [address]  Lactate dehydrogenase (LDH)     Status: Abnormal   Collection Time: 03/16/22 11:23 AM  Result Value Ref Range   LDH 350 (H) 98 - 192 U/L    Comment: Performed at [facility], [place] [address], [place], [place] [address]  CMP ([place] only)     Status: Abnormal   Collection Time: 03/16/22 11:23 AM  Result Value Ref Range   Sodium 138 135 - 145 mmol/L   Potassium 4.9 3.5 - 5.1 mmol/L   Chloride 105 98 - 111 mmol/L   CO2 28 22 - 32 mmol/L   Glucose, Bld 205 (H) 70 - 99 mg/dL    Comment: Glucose reference range applies only to samples taken after fasting for at least 8 hours.   BUN 16 8 - 23 mg/dL   Creatinine 1.23 0.61 - 1.24 mg/dL   Calcium 9.7 8.9 - 10.3 mg/dL   Total Protein 8.1 6.5 - 8.1  g/dL   Albumin 3.8 3.5 - 5.0 g/dL   AST 14 (L) 15 - 41 U/L   ALT 15 0 - 44 U/L   Alkaline Phosphatase 55 38 - 126 U/L   Total Bilirubin 0.8 0.3 - 1.2 mg/dL   GFR, Estimated >60 >60 mL/min    Comment: (NOTE) Calculated using the CKD-EPI Creatinine Equation (2021)    Anion gap 5 5 - 15    Comment: Performed at [facility], [place] [address], [place], [place] [address]  CBC with Differential ([place] Only)     Status: None   Collection Time: 03/16/22 11:23 AM  Result Value Ref Range   WBC Count 4.8 4.0 - 10.5 K/uL   RBC 5.08 4.22 - 5.81 MIL/uL   Hemoglobin 13.2 13.0 - 17.0 g/dL   HCT 40.9 39.0 - 52.0 %   MCV 80.5 80.0 - 100.0 fL   MCH 26.0  26.0 - 34.0 pg   MCHC 32.3 30.0 - 36.0 g/dL   RDW 13.7 11.5 - 15.5 %   Platelet Count 242 150 - 400 K/uL   nRBC 0.0 0.0 - 0.2 %   Neutrophils Relative % 50 %   Neutro Abs 2.4 1.7 - 7.7 K/uL   Lymphocytes Relative 34 %   Lymphs Abs 1.6 0.7 - 4.0 K/uL   Monocytes Relative 11 %   Monocytes Absolute 0.5 0.1 - 1.0 K/uL   Eosinophils Relative 4 %   Eosinophils Absolute 0.2 0.0 - 0.5 K/uL   Basophils Relative 1 %   Basophils Absolute 0.1 0.0 - 0.1 K/uL   Immature Granulocytes 0 %   Abs Immature Granulocytes 0.01 0.00 - 0.07 K/uL    Comment: Performed at [facility], [place] [address] [place], [place] [address]  Glucose, capillary     Status: Abnormal   Collection Time: 04/13/22  1:11 PM  Result Value Ref Range   Glucose-Capillary 179 (H) 70 - 99 mg/dL    Comment: Glucose reference range applies only to samples taken after fasting for at least 8 hours.  Hemoglobin A1c per protocol     Status: Abnormal   Collection Time: 04/13/22  1:27 PM  Result Value Ref Range   Hgb A1c MFr Bld 8.1 (H) 4.8 - 5.6 %    Comment: (NOTE) Pre diabetes:          5.7%-6.4%  Diabetes:              >6.4%  Glycemic control for   <7.0% adults with diabetes    Mean Plasma Glucose 185.77 mg/dL    Comment:  Performed at [facility] [address], [place], [place] [address]  Surgical pathology     Status: None   Collection Time: 04/14/22 12:00 AM  Result Value Ref Range   SURGICAL PATHOLOGY      Surgical Pathology CASE: [id number] PATIENT: Alex Cortez Flow Pathology Report     Clinical history: lymphadenopathy     DIAGNOSIS:  -Monoclonal B-cell population identified -See comment  COMMENT:  The findings are consistent with non-Hodgkin B-cell lymphoma.  GATING AND PHENOTYPIC ANALYSIS:  Gated population: Flow cytometric immunophenotyping is performed using antibodies to the antigens listed in the table below. Electronic gates are placed around a cell cluster displaying light scatter properties corresponding to: lymphocytes  Abnormal Cells in gated population: 62%  Phenotype of Abnormal Cells: CD10, CD19, CD20, CD38, Kappa                        Lymphoid Antigens       Myeloid Antigens Miscellaneous CD2  NEG  CD10 POS  CD11b     ND   CD45 POS CD3  NEG  CD19 POS  CD11c     ND   HLA-Dr    ND CD4  NEG  CD20 POS  CD13 ND   CD34 NEG CD5  NEG  CD22 ND   CD14 ND   CD38 POS CD7  NEG  CD79b     ND   CD15 ND   CD138     ND CD8  NEG  CD103      ND   CD16 ND   TdT  ND CD25 ND   CD200     NEG  CD33 ND   CD123     ND TCRab     ND   sKappa  POS  CD64 ND   CD41 ND TCRgd     NEG  sLambda   NEG  CD117     ND   CD61 ND CD56 NEG  cKappa    ND   MPO  ND   CD71 ND CD57 ND   cLambda   ND        CD235aND      GROSS DESCRIPTION:  Reference tissue case [id number].    Final Diagnosis performed by Susanne Greenhouse, MD.   Electronically signed 04/16/2022 Technical and / or Professional components performed at [facility], [place] [address] [place],  [address].  The above tests were developed and their performance characteristics determined by the [facility] system for the physical and immunophenotypic characterization of cell populations. They have  not been cleared by the U.S. Food and Drug administration. The  FDA has determined that such clearance or approval is not necessary. This test is used for clinical purposes. It should not be  regarded as investigational or for research   Glucose, capillary     Status: Abnormal   Collection Time: 04/14/22  7:54 AM  Result Value Ref Range   Glucose-Capillary 216 (H) 70 - 99 mg/dL    Comment: Glucose reference range applies only to samples taken after fasting for at least 8 hours.  Surgical pathology     Status: None   Collection Time: 04/14/22 10:34 AM  Result Value Ref Range   SURGICAL PATHOLOGY      SURGICAL PATHOLOGY CASE: [id number] PATIENT: Alex Cortez Surgical Pathology Report     Clinical History: Lymphadenopathy; for lymphoma workup (crm)     FINAL MICROSCOPIC DIAGNOSIS:  A. 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  The nodular areas represent atypical lymphoid follicles characterized by back-to-back arrangement, attenuated or ill-defined mantle zones, lack of polarity and a homogeneous composition of primarily large lymphoid cells.  The diffuse and follicular components show similar cellular composition and are both estimated to represent 50% of the lymph  nodal tissue .  Flow cytometric analysis was performed [facility] [phone]) and shows a monoclonal, kappa restricted B-cell population expressing B-cell antigens including CD20 associated with CD10 expression.  In addition, immunohistochemical stains for Bcl-2, BCL6, CD10, CD20, CD79a, cyclin D1, CD21, CD3, CD5 and CD30 were  performed on block A1 with appropriate controls.  The lymphoid process is predominantly composed of B cells as seen with CD20 and CD79a associated with Bcl-2, CD10 and variable BCL6 expression.  No significant cyclin D1 positivity is identified.  CD30 only highlights scattering of positive cells.  CD21 highlights the numerous follicular dendritic networks seen in the background. There is an admixed T-cell population to a lesser extent as seen with CD3 and CD5 and there is no apparent co-expression of CD5 in B-cell areas. The overall findings are consistent with diffuse large B-cell lymphoma arising in a background of high-grade follicular lymphoma.  The resu lts were discussed with Dr. Lorenso Courier on 04/16/2022.  GROSS DESCRIPTION:  Received fresh is a 4.8 x 4.7 x 4 cm rubbery ovoid nodule.  The cut surface is solid, tan-pink and vaguely nodular.  Touch imprints are made from the cut surface.  A portion of the specimen  is placed in RPMI for flow cytometry.  Sections are submitted in 3 cassettes.  [facility] 04/14/2022)   Final Diagnosis performed by Susanne Greenhouse, MD.   Electronically signed 04/16/2022 Technical and / or Professional components performed at [facility] [address] [place], [place] [address].  Immunohistochemistry Technical component (if applicable) was performed at [facility]. [address], [address], [place], [place] [address].   IMMUNOHISTOCHEMISTRY DISCLAIMER (if applicable): Some of these immunohistochemical stains may have been developed and the performance characteristics determine by [facility]. Some may not have been cleared or approved by the U.S. Food an d Insurance risk surveyor. The FDA has determined that such clearance or approval is not necessary. This test is used for clinical purposes. It should not be regarded as investigational or for research. This laboratory is certified under the [place] (CLIA-88) as qualified to perform high complexity clinical laboratory testing.  The controls stained appropriately.   Glucose, capillary     Status: Abnormal   Collection Time: 04/14/22 11:08 AM  Result Value Ref Range   Glucose-Capillary 224 (H) 70 - 99 mg/dL    Comment: Glucose reference range applies only to samples taken after fasting for at least 8 hours.  ECHOCARDIOGRAM COMPLETE     Status: None   Collection Time: 04/19/22  9:42 AM  Result Value Ref Range   S' Lateral 2.80 cm   Area-P 1/2 3.79 cm2   Est EF 55 - 60%   Glucose, capillary     Status: Abnormal   Collection Time: 04/21/22  8:06 AM  Result Value Ref Range   Glucose-Capillary 119 (H) 70 - 99 mg/dL    Comment: Glucose reference range applies only to samples taken after fasting for at least 8 hours.  Lactate dehydrogenase (LDH)     Status: Abnormal   Collection Time: 04/23/22  2:19 PM  Result Value Ref Range   LDH 519 (H) 98 - 192 U/L    Comment: Performed at [facility], [place] [address], [place], [place] [address]  CMP ([place] only)     Status: None   Collection Time: 04/23/22  2:19 PM  Result Value Ref Range   Sodium 142 135 - 145 mmol/L   Potassium 4.0 3.5 - 5.1 mmol/L   Chloride 108 98 - 111 mmol/L   CO2 28 22 - 32 mmol/L   Glucose, Bld 83 70 - 99 mg/dL    Comment: Glucose reference range applies only to samples taken after fasting for at least 8 hours.   BUN 17 8 - 23 mg/dL   Creatinine 1.22 0.61 - 1.24 mg/dL   Calcium 8.9 8.9 - 10.3 mg/dL   Total Protein 7.1 6.5 - 8.1 g/dL   Albumin 3.6 3.5 - 5.0 g/dL   AST 19 15 - 41 U/L   ALT 23 0 - 44 U/L   Alkaline Phosphatase 54 38 - 126 U/L   Total Bilirubin 1.0 0.3 - 1.2 mg/dL   GFR, Estimated >60 >60 mL/min    Comment: (NOTE) Calculated using the CKD-EPI Creatinine Equation (2021)    Anion gap 6 5 - 15    Comment: Performed at [facility], [place] [address],  [place], [place] [address]  CBC with Differential ([place] Only)     Status: Abnormal   Collection Time: 04/23/22  2:19 PM  Result Value Ref Range   WBC Count 5.2 4.0 - 10.5  K/uL   RBC 4.79 4.22 - 5.81 MIL/uL   Hemoglobin 12.3 (L) 13.0 - 17.0 g/dL   HCT 37.8 (L) 39.0 - 52.0 %   MCV 78.9 (L) 80.0 - 100.0 fL   MCH 25.7 (L) 26.0 - 34.0 pg   MCHC 32.5 30.0 - 36.0 g/dL   RDW 14.3 11.5 - 15.5 %   Platelet Count 163 150 - 400 K/uL   nRBC 0.0 0.0 - 0.2 %   Neutrophils Relative % 40 %   Neutro Abs 2.1 1.7 - 7.7 K/uL   Lymphocytes Relative 40 %   Lymphs Abs 2.1 0.7 - 4.0 K/uL   Monocytes Relative 14 %   Monocytes Absolute 0.7 0.1 - 1.0 K/uL   Eosinophils Relative 5 %   Eosinophils Absolute 0.3 0.0 - 0.5 K/uL   Basophils Relative 1 %   Basophils Absolute 0.0 0.0 - 0.1 K/uL   WBC Morphology VARIANT LYMPHS PRESENT    RBC Morphology MORPHOLOGY UNREMARKABLE    Smear Review LARGE AND GIANT PLATELETS OBSERVED    Immature Granulocytes 0 %   Abs Immature Granulocytes 0.01 0.00 - 0.07 K/uL    Comment: Performed at [facility], [place] [address], [place], [place] [address]  Glucose, capillary     Status: Abnormal   Collection Time: 04/26/22 11:10 AM  Result Value Ref Range   Glucose-Capillary 144 (H) 70 - 99 mg/dL    Comment: Glucose reference range applies only to samples taken after fasting for at least 8 hours.   Lipid Panel     Component Value Date/Time   CHOL 135 12/26/2021 0000   CHOL 145 09/11/2021 0810   TRIG 88 12/26/2021 0000   HDL 35 12/26/2021 0000   HDL 35 (L) 09/11/2021 0810   CHOLHDL 4.1 09/11/2021 0810   CHOLHDL 4.5 08/01/2017 0243   VLDL 20 08/01/2017 0243   LDLCALC 82 12/26/2021 0000   LDLCALC 89 09/11/2021 0810   LABVLDL 21 09/11/2021 0810     Assessment & Plan:   1. Type 2 diabetes mellitus with vascular disease (HCC)  -His  diabetes is complicated by recurrent CVA , coronary artery disease, obesity/sedentary life and patient remains at  extremely  high risk for more acute and chronic complications of diabetes which include CAD, CVA, CKD, retinopathy, and neuropathy. These are all discussed in detail with the patient.  Alex Cortez presents with his CGM device.  His device was downloaded and analyzed.  Over the last 14 days, he has 67% time in range, 21% level 1 hyperglycemia, 10% level 2 hyperglycemia.  His previsit labs show A1c of 8.1%, improving from 9.2%.  He did not have significant hypoglycemia.   His recent average blood glucose is 158 mg per DL for the last 14 days.     - Glucose logs and insulin administration records pertaining to this visit,  to be scanned into patient's records.  Recent labs reviewed.  He is diagnosed with lymphoma recently, being prepared for chemotherapy.  - I have re-counseled the patient on diet management and weight loss  by adopting a carbohydrate restricted / protein rich  Diet.  - he acknowledges that there is a room for improvement in his food and drink choices. - Suggestion is made for him to avoid simple carbohydrates  from his diet including Cakes, Sweet Desserts, Ice Cream, Soda (diet and regular), Sweet Tea, Candies, Chips, Cookies, Store Bought Juices, Alcohol , Artificial Sweeteners,  Coffee Creamer, and "Sugar-free" Products, Lemonade. This will  help patient to have more stable blood glucose profile and potentially avoid unintended weight gain.  The following Lifestyle Medicine recommendations according to [facility]) were discussed and and offered to patient and he  agrees to start the journey:  A. Whole Foods, Plant-Based Nutrition comprising of fruits and vegetables, plant-based proteins, whole-grain carbohydrates was discussed in detail with the patient.   A list for source of those nutrients were also provided to the patient.  Patient will use only water or unsweetened tea for hydration. B.  The need to stay away from risky substances including  alcohol, smoking; obtaining 7 to 9 hours of restorative sleep, at least 150 minutes of moderate intensity exercise weekly, the importance of healthy social connections,  and stress management techniques were discussed. C.  A full color page of  Calorie density of various food groups per pound showing examples of each food groups was provided to the patient.    - Patient is advised to stick to a routine mealtimes to eat 3 meals  a day and avoid unnecessary snacks ( to snack only to correct hypoglycemia).  - I have approached patient with the following individualized plan to manage diabetes and patient agrees.     He presents with continued engagement and control of glycemia.  He is advised to lower his Humulin R U500  to 70  units with breakfast, 70 units with lunch, and 70 units with supper  for pre-meal blood glucose readings of 90 mg/dL or above.   -I have advised him on how he can rotate insulin injection sites on his abdominal skin. -He is warned not not to take insulin without proper monitoring of blood glucose.  He is advised to  call clinic if he registers blood glucose less than 70 mg/dL or greater than 200 mg/dL.  He will continue to benefit from  metformin-advised to lower metformin to 500 mg p.o. daily at breakfast.  He may continue to benefit from low-dose [place], advised to continue Farxiga 5 mg p.o. daily at breakfast.  Side effects and precautions discussed with him.     - Patient specific target  for A1c; LDL, HDL, Triglycerides, and  Waist Circumference were discussed in detail.  2) BP/HTN: -His blood pressure is controlled to target.  This medication was stopped and he is currently on losartan 25 mg p.o. daily at breakfast.     3) Lipids/HPL: His lipid panel shows uncontrolled LDL at 82.  He is advised to continue atorvastatin 80 mg p.o. nightly.  Whole food plant-based diet will help with his lipid panel, however, patient did not engage optimally.  Side effects and  precautions discussed with him.   4)  Weight/Diet: Comes with weight gain.  His BMI is [id number] complicating his diabetes care.   Has no success for weight control mainly due to his dietary indiscretion, a good candidate for lifestyle medicine, however did not engage optimally.  He has consulted with CDE.   5) hypothyroidism- His recent  thyroid function tests are consistent with appropriate replacement.  He is advised to continue levothyroxine 75 mcg p.o. daily before breakfast    - We discussed about the correct intake of his thyroid hormone, on empty stomach at fasting, with water, separated by at least 30 minutes from breakfast and other medications,  and separated by more than 4 hours from calcium, iron, multivitamins, acid reflux medications (PPIs). -Patient is made aware of the fact that thyroid hormone replacement is  needed for life, dose to be adjusted by periodic monitoring of thyroid function tests.   6) Chronic Care/Health Maintenance:  -Patient  is  on ACEI/ARB and Statin medications and encouraged to continue to follow up with Ophthalmology, urologist given his recent recurrent CVA, podiatrist at least yearly or according to recommendations, and advised to  stay away from smoking. I have recommended yearly flu vaccine and pneumonia vaccination at least every 5 years; moderate intensity exercise for up to 150 minutes weekly; and  sleep for at least 7 hours a day.  POCT ABI was normal on April 09, 2020, next study will be repeated in January 2027, or sooner if needed.    - I advised patient to maintain close follow up with Bur[place], Virgina Evener, MD for primary care needs.   I spent  41  minutes in the care of the patient today including review of labs from [facility], Lipids, Thyroid Function, Hematology (current and previous including abstractions from other facilities); face-to-face time discussing  his blood glucose readings/logs, discussing hypoglycemia and hyperglycemia episodes and  symptoms, medications doses, his options of short and long term treatment based on the latest standards of care / guidelines;  discussion about incorporating lifestyle medicine;  and documenting the encounter. Risk reduction counseling performed per USPSTF guidelines to reduce  obesity and cardiovascular risk factors.     Please refer to Patient Instructions for Blood Glucose Monitoring and Insulin/Medications Dosing Guide"  in media tab for additional information. Please  also refer to " Patient Self Inventory" in the Media  tab for reviewed elements of pertinent patient history.  [place] Staff Kersting participated in the discussions, expressed understanding, and voiced agreement with the above plans.  All questions were answered to his satisfaction. he is encouraged to contact clinic should he have any questions or concerns prior to his return visit.    Follow up plan: -Return in about 3 months (around 08/01/2022) for Bring Meter/CGM Device/Logs- A1c in Office.  Glade Lloyd, MD Phone: [phone]  Fax: [phone]  -  This note was partially dictated with voice recognition software. Similar sounding words can be transcribed inadequately or may not  be corrected upon review.  05/03/2022, 12:42 PM

## 2022-05-04 ENCOUNTER — Ambulatory Visit: Payer: Self-pay | Admitting: *Deleted

## 2022-05-04 ENCOUNTER — Other Ambulatory Visit: Payer: Self-pay

## 2022-05-04 NOTE — Patient Outreach (Signed)
  Care Coordination  Outreach Note  05/04/2022 Name: QUAMAINE KIRCHBERG MRN: [id number] DOB: 1950-05-27   Care Coordination Outreach Attempts: An unsuccessful telephone outreach was attempted for a scheduled appointment today.  Follow Up Plan:  Additional outreach attempts will be made to offer the patient care coordination information and services.   Encounter Outcome:  No Answer  Chong Sicilian, BSN, RN-BC RN Care Coordinator [place] Direct Dial: [phone] Main #: [phone]

## 2022-05-05 ENCOUNTER — Telehealth: Payer: Self-pay | Admitting: Hematology and Oncology

## 2022-05-05 ENCOUNTER — Inpatient Hospital Stay (HOSPITAL_BASED_OUTPATIENT_CLINIC_OR_DEPARTMENT_OTHER): Payer: Medicare Other | Admitting: Hematology and Oncology

## 2022-05-05 ENCOUNTER — Other Ambulatory Visit: Payer: Self-pay

## 2022-05-05 ENCOUNTER — Encounter: Payer: Self-pay | Admitting: Physician Assistant

## 2022-05-05 ENCOUNTER — Inpatient Hospital Stay: Payer: Medicare Other

## 2022-05-05 VITALS — BP 119/66 | HR 66 | Temp 97.8°F | Resp 13 | Wt 284.9 lb

## 2022-05-05 DIAGNOSIS — Z87891 Personal history of nicotine dependence: Secondary | ICD-10-CM | POA: Diagnosis not present

## 2022-05-05 DIAGNOSIS — E039 Hypothyroidism, unspecified: Secondary | ICD-10-CM | POA: Diagnosis not present

## 2022-05-05 DIAGNOSIS — C8335 Diffuse large B-cell lymphoma, lymph nodes of inguinal region and lower limb: Secondary | ICD-10-CM | POA: Diagnosis not present

## 2022-05-05 DIAGNOSIS — E119 Type 2 diabetes mellitus without complications: Secondary | ICD-10-CM | POA: Diagnosis not present

## 2022-05-05 DIAGNOSIS — C8338 Diffuse large B-cell lymphoma, lymph nodes of multiple sites: Secondary | ICD-10-CM

## 2022-05-05 DIAGNOSIS — I10 Essential (primary) hypertension: Secondary | ICD-10-CM | POA: Diagnosis not present

## 2022-05-05 DIAGNOSIS — D509 Iron deficiency anemia, unspecified: Secondary | ICD-10-CM | POA: Diagnosis not present

## 2022-05-05 DIAGNOSIS — Z95828 Presence of other vascular implants and grafts: Secondary | ICD-10-CM | POA: Insufficient documentation

## 2022-05-05 LAB — CMP (CANCER CENTER ONLY)
ALT: 23 U/L (ref 0–44)
AST: 22 U/L (ref 15–41)
Albumin: 3.7 g/dL (ref 3.5–5.0)
Alkaline Phosphatase: 64 U/L (ref 38–126)
Anion gap: 6 (ref 5–15)
BUN: 17 mg/dL (ref 8–23)
CO2: 26 mmol/L (ref 22–32)
Calcium: 8.4 mg/dL — ABNORMAL LOW (ref 8.9–10.3)
Chloride: 108 mmol/L (ref 98–111)
Creatinine: 1.12 mg/dL (ref 0.61–1.24)
GFR, Estimated: >60 mL/min (ref >60)
Glucose, Bld: 174 mg/dL — ABNORMAL HIGH (ref 70–99)
Potassium: 4.3 mmol/L (ref 3.5–5.1)
Sodium: 140 mmol/L (ref 135–145)
Total Bilirubin: 0.8 mg/dL (ref 0.3–1.2)
Total Protein: 6.8 g/dL (ref 6.5–8.1)

## 2022-05-05 LAB — CBC WITH DIFFERENTIAL (CANCER CENTER ONLY)
Abs Immature Granulocytes: 0.02 10*3/uL (ref 0.00–0.07)
Basophils Absolute: 0 10*3/uL (ref 0.0–0.1)
Basophils Relative: 1 %
Eosinophils Absolute: 0.2 10*3/uL (ref 0.0–0.5)
Eosinophils Relative: 6 %
HCT: 37 % — ABNORMAL LOW (ref 39.0–52.0)
Hemoglobin: 12.1 g/dL — ABNORMAL LOW (ref 13.0–17.0)
Immature Granulocytes: 1 %
Lymphocytes Relative: 36 %
Lymphs Abs: 1.5 10*3/uL (ref 0.7–4.0)
MCH: 25.9 pg — ABNORMAL LOW (ref 26.0–34.0)
MCHC: 32.7 g/dL (ref 30.0–36.0)
MCV: 79.2 fL — ABNORMAL LOW (ref 80.0–100.0)
Monocytes Absolute: 0.5 10*3/uL (ref 0.1–1.0)
Monocytes Relative: 11 %
Neutro Abs: 1.8 10*3/uL (ref 1.7–7.7)
Neutrophils Relative %: 45 %
Platelet Count: 197 10*3/uL (ref 150–400)
RBC: 4.67 MIL/uL (ref 4.22–5.81)
RDW: 14.7 % (ref 11.5–15.5)
WBC Count: 4.1 10*3/uL (ref 4.0–10.5)
nRBC: 0 % (ref 0.0–0.2)

## 2022-05-05 LAB — HEPATITIS B CORE ANTIBODY, TOTAL: Hep B Core Total Ab: NONREACTIVE

## 2022-05-05 LAB — HEPATITIS B SURFACE ANTIGEN: Hepatitis B Surface Ag: NONREACTIVE

## 2022-05-05 LAB — LACTATE DEHYDROGENASE: LDH: 629 U/L — ABNORMAL HIGH (ref 98–192)

## 2022-05-05 LAB — HEPATITIS B SURFACE ANTIBODY,QUALITATIVE: Hep B S Ab: NONREACTIVE

## 2022-05-05 LAB — URIC ACID: Uric Acid, Serum: 6.6 mg/dL (ref 3.7–8.6)

## 2022-05-05 MED ORDER — SODIUM CHLORIDE 0.9% FLUSH
10.0000 mL | Freq: Once | INTRAVENOUS | Status: AC
  Administered 2022-05-05: 10 mL

## 2022-05-05 MED ORDER — HEPARIN SOD (PORK) LOCK FLUSH 100 UNIT/ML IV SOLN
500.0000 [IU] | Freq: Once | INTRAVENOUS | Status: AC
  Administered 2022-05-05: 500 [IU]

## 2022-05-05 MED FILL — Dexamethasone Sodium Phosphate Inj 100 MG/10ML: INTRAMUSCULAR | Qty: 1 | Status: AC

## 2022-05-05 MED FILL — Fosaprepitant Dimeglumine For IV Infusion 150 MG (Base Eq): INTRAVENOUS | Qty: 5 | Status: AC

## 2022-05-05 NOTE — Progress Notes (Signed)
Received call from patient's spouse after receiving my card per my request.  Introduced myself as Paramedic and to discuss available resources.  Discussed one-time $1000 Radio broadcast assistant to assist with personal expenses while going through treatment. Based on verbal income guidelines, patient states they exceed the allowed amount.  Discussed [place] whom assists patients whom live in [place]. Advised an application could be provided at next visit and she states that would be fine. Advised application and supporting documents may be returned at the cancer center for me to email to them or can be submitted at address at bottom of application. She verbalized understanding.  She has my card for any additional financial questions or concerns.

## 2022-05-05 NOTE — Telephone Encounter (Signed)
Called patient to reschedule treatment dates due to set back. Patient rescheduled and notified.

## 2022-05-05 NOTE — Progress Notes (Signed)
[place] [phone]   [phone]  PROGRESS NOTE  Patient Care Team: Curlene Labrum, MD as PCP - General Branch, Alphonse Guild, MD as PCP - Cardiology (Cardiology) Cassandria Anger, MD as Consulting Physician (Endocrinology) Ilean China, RN as [place] History # Diffuse Large B Cell Lymphoma, Stage III 03/16/2022: establish care with Dr. Lorenso Courier in [facility] 04/14/2022: excisional lymph node biopsy of right inguinal node shows DLBCL arising from follicular lymphoma 0000000: PET CT scan showed widespread hypermetabolic lymphadenopathy in the neck, chest, abdomen, and pelvis 05/06/2021: Defer Cycle 1 Day 1 of R-CHOP chemotherapy. Patient has wound dehiscence and possible infection.  Seen by surgery same day.  Interval History:  Alex Cortez 72 y.o. male with medical history significant for newly diagnosed diffuse large B-cell lymphoma who presents for a follow up visit. The patient's last visit was on 04/23/2022. In the interim since the last visit he had his port placed and completed chemo education.  On exam today Alex Cortez is accompanied by his wife.  He reports that he has been well overall interim since her last visit.  He is not having any nausea, vomiting, or diarrhea.  He is not having any fevers, chills, sweats.  He reports he has not noticed any new lymphadenopathy.  He is eating well and his bowels are moving regularly.  He is undergoing chemotherapy education and port placement.  He reports no difficulties with his port and he found chemotherapy education to be informative.  He otherwise denies any new concerning findings in the interim since her last visit.  He did however report that he is concerned about some discomfort and pain he is having at his prior lymph node excisional biopsy site.  We did examine that today which did show signs concerning for infection  and therefore we recommended him to urgently see his surgeon.  We were able to get him an appointment for later today.   MEDICAL HISTORY:  Past Medical History:  Diagnosis Date   CAD (coronary artery disease)    nonobstructive CAD/patent LAD stent site; EF 65%, 08/2011; NSTEMI/DES LAD, 6/20 11   Chest pain    Diabetes mellitus    insulin-dependent   Dyslipidemia    HTN (hypertension)    Hypothyroidism    Myocardial infarction ([place]) 2011   Post MI syndrome ([place]) 08/29/2009   Stroke ([place]) 10/22/2012   Left inferior pons    SURGICAL HISTORY: Past Surgical History:  Procedure Laterality Date   COLONOSCOPY  08/2021   CORONARY ANGIOPLASTY WITH STENT PLACEMENT  2011   IR IMAGING GUIDED PORT INSERTION  04/26/2022   LYMPH NODE BIOPSY Right 04/14/2022   Procedure: RIGHT INGUINAL EXCISIONAL LYMPH NODE BIOPSY;  Surgeon: Dwan Bolt, MD;  Location: [facility];  Service: General;  Laterality: Right;    SOCIAL HISTORY: Social History   Socioeconomic History   Marital status: Married    Spouse name: Not on file   Number of children: Not on file   Years of education: Not on file   Highest education level: Not on file  Occupational History   Not on file  Tobacco Use   Smoking status: Former    Packs/day: 0.50    Years: 23.00    Total pack years: 11.50    Types: Cigarettes    Start date: 03/16/1955    Quit date: 03/15/1988    Years since quitting: 34.1   Smokeless tobacco: Never  Vaping Use   Vaping Use: Never used  Substance and Sexual Activity   Alcohol use: No    Alcohol/week: 0.0 standard drinks of alcohol   Drug use: No   Sexual activity: Not on file  Other Topics Concern   Not on file  Social History Narrative   Works at [employer]. Married.    Social Determinants of Health   Financial Resource Strain: Low Risk  (03/17/2022)   Overall Financial Resource Strain (CARDIA)    Difficulty of Paying Living Expenses: Not hard at all  Food Insecurity: Unknown (12/01/2021)    Hunger Vital Sign    Worried About Running Out of Food in the Last Year: Not on file    Ran Out of Food in the Last Year: Never true  Transportation Needs: No Transportation Needs (04/07/2022)   PRAPARE - Hydrologist (Medical): No    Lack of Transportation (Non-Medical): No  Physical Activity: Not on file  Stress: No Stress Concern Present (04/07/2022)   [place]    Feeling of Stress : Not at all  Social Connections: Unknown (07/31/2017)   Social Connection and Isolation Panel [NHANES]    Frequency of Communication with Friends and Family: Patient refused    Frequency of Social Gatherings with Friends and Family: Patient refused    Attends Religious Services: Patient refused    Active Member of Clubs or Organizations: Patient refused    Attends Archivist Meetings: Patient refused    Marital Status: Patient refused  Intimate Partner Violence: Unknown (07/31/2017)   Humiliation, Afraid, Rape, and Kick questionnaire    Fear of Current or Ex-Partner: Patient refused    Emotionally Abused: Patient refused    Physically Abused: Patient refused    Sexually Abused: Patient refused    FAMILY HISTORY: Family History  Problem Relation Age of Onset   Colon cancer Mother    Hypertension Other        in all family members    ALLERGIES:  is allergic to ace inhibitors and shrimp [shellfish allergy].  MEDICATIONS:  Current Outpatient Medications  Medication Sig Dispense Refill   allopurinol (ZYLOPRIM) 300 MG tablet Take 1 tablet (300 mg total) by mouth daily. 90 tablet 1   lidocaine-prilocaine (EMLA) cream Apply 1 Application topically as needed. 30 g 0   ondansetron (ZOFRAN) 8 MG tablet Take 1 tablet (8 mg total) by mouth every 8 (eight) hours as needed. 30 tablet 0   predniSONE (DELTASONE) 20 MG tablet Take 3 tablets (60 mg total) by mouth daily with breakfast. Take on Day 1 of  chemotherapy and continue until Day 5. Repeat with each chemotherapy treatment. 15 tablet 5   prochlorperazine (COMPAZINE) 10 MG tablet Take 1 tablet (10 mg total) by mouth every 6 (six) hours as needed for nausea or vomiting. 30 tablet 0   amLODipine (NORVASC) 10 MG tablet Take 10 mg by mouth daily. for high blood pressure     carvedilol (COREG) 25 MG tablet Take 1 tablet (25 mg total) by mouth 2 (two) times daily. 180 tablet 3   clopidogrel (PLAVIX) 75 MG tablet Take 1 tablet (75 mg total) by mouth daily. 30 tablet 5   Continuous Blood Gluc Receiver (FREESTYLE LIBRE 2 READER) DEVI As directed 1 each 0   Continuous Blood Gluc Sensor (FREESTYLE LIBRE 2 SENSOR) MISC APPLY 1 SENSOR EVERY 14    DAYS 6 each 0   ezetimibe (ZETIA)  10 MG tablet TAKE 1 TABLET BY MOUTH EVERY DAY 90 tablet 2   FARXIGA 5 MG TABS tablet TAKE 1 TABLET BY MOUTH EVERY DAY BEFORE BREAKFAST 30 tablet 2   fluticasone (FLONASE) 50 MCG/ACT nasal spray Place 2 sprays into both nostrils daily as needed for allergies.     insulin regular human CONCENTRATED (HUMULIN R U-500 KWIKPEN) 500 UNIT/ML KwikPen INJECT AS DIRECTED 70 UNITS WITH BREAKFAST, 70 UNITS WITH LUNCH, AND 70 UNITS WITH SUPPER FOR PRE-MEAL BLOOD GLUCOSE READINGS OF 90MG/DL OR ABOVE 45 mL 0   isosorbide mononitrate (IMDUR) 30 MG 24 hr tablet TAKE 1 TABLET BY MOUTH EVERY DAY 90 tablet 1   levothyroxine (SYNTHROID) 75 MCG tablet TAKE 1 TABLET BY MOUTH EVERY DAY BEFORE BREAKFAST 90 tablet 0   losartan (COZAAR) 50 MG tablet Take 1 tablet (50 mg total) by mouth daily.     metFORMIN (GLUCOPHAGE) 500 MG tablet Take 1 tablet (500 mg total) by mouth daily with breakfast. 90 tablet 1   nitroGLYCERIN (NITROSTAT) 0.4 MG SL tablet Place 1 tablet (0.4 mg total) under the tongue every 5 (five) minutes as needed. 25 tablet 3   ONE TOUCH ULTRA TEST test strip USE TO TEST BLOOD SUGAR FOUR TIMES DAILY E11.65 150 each 5   rosuvastatin (CRESTOR) 40 MG tablet TAKE 1 TABLET BY MOUTH EVERY DAY 90  tablet 2   No current facility-administered medications for this visit.    REVIEW OF SYSTEMS:   Constitutional: ( - ) fevers, ( - )  chills , ( - ) night sweats Eyes: ( - ) blurriness of vision, ( - ) double vision, ( - ) watery eyes Ears, nose, mouth, throat, and face: ( - ) mucositis, ( - ) sore throat Respiratory: ( - ) cough, ( - ) dyspnea, ( - ) wheezes Cardiovascular: ( - ) palpitation, ( - ) chest discomfort, ( - ) lower extremity swelling Gastrointestinal:  ( - ) nausea, ( - ) heartburn, ( - ) change in bowel habits Skin: ( - ) abnormal skin rashes Lymphatics: ( - ) new lymphadenopathy, ( - ) easy bruising Neurological: ( - ) numbness, ( - ) tingling, ( - ) new weaknesses Behavioral/Psych: ( - ) mood change, ( - ) new changes  All other systems were reviewed with the patient and are negative.  PHYSICAL EXAMINATION: ECOG PERFORMANCE STATUS: 0 - Asymptomatic  Vitals:   05/05/22 0955  BP: 119/66  Pulse: 66  Resp: 13  Temp: 97.8 F (36.6 C)  SpO2: 99%    Filed Weights   05/05/22 0955  Weight: 284 lb 14.4 oz (129.2 kg)     GENERAL: Well-appearing elderly African-American male alert, no distress and comfortable SKIN: skin color, texture, turgor are normal, no rashes or significant lesions EYES: conjunctiva are pink and non-injected, sclera clear NECK: supple, non-tender LYMPH:   palpable lymphadenopathy in the cervical and axillary nodes.  LUNGS: clear to auscultation and percussion with normal breathing effort HEART: regular rate & rhythm and no murmurs and no lower extremity edema Musculoskeletal: no cyanosis of digits and no clubbing  PSYCH: alert & oriented x 3, fluent speech NEURO: no focal motor/sensory deficits  LABORATORY DATA:  I have reviewed the data as listed    Latest Ref Rng & Units 05/05/2022    9:17 AM 04/23/2022    2:19 PM 03/16/2022   11:23 AM  CBC  WBC 4.0 - 10.5 K/uL 4.1  5.2  4.8   Hemoglobin 13.0 -  17.0 g/dL 12.1  12.3  13.2   Hematocrit  39.0 - 52.0 % 37.0  37.8  40.9   Platelets 150 - 400 K/uL 197  163  242        Latest Ref Rng & Units 05/05/2022    9:17 AM 04/23/2022    2:19 PM 03/16/2022   11:23 AM  CMP  Glucose 70 - 99 mg/dL 174  83  205   BUN 8 - 23 mg/dL 17  17  16   $ Creatinine 0.61 - 1.24 mg/dL 1.12  1.22  1.23   Sodium 135 - 145 mmol/L 140  142  138   Potassium 3.5 - 5.1 mmol/L 4.3  4.0  4.9   Chloride 98 - 111 mmol/L 108  108  105   CO2 22 - 32 mmol/L 26  28  28   $ Calcium 8.9 - 10.3 mg/dL 8.4  8.9  9.7   Total Protein 6.5 - 8.1 g/dL 6.8  7.1  8.1   Total Bilirubin 0.3 - 1.2 mg/dL 0.8  1.0  0.8   Alkaline Phos 38 - 126 U/L 64  54  55   AST 15 - 41 U/L 22  19  14   $ ALT 0 - 44 U/L 23  23  15     $ RADIOGRAPHIC STUDIES: I have personally reviewed the radiological images as listed and agreed with the findings in the report: Diffuse involving the lymph nodes in the neck, underarms, chest, abdomen and groin. IR IMAGING GUIDED PORT INSERTION  Result Date: 04/26/2022 INDICATION: 72 year old male with history of diffuse large B-cell lymphoma presenting for Port-A-Cath placement. EXAM: IMPLANTED PORT A CATH PLACEMENT WITH ULTRASOUND AND FLUOROSCOPIC GUIDANCE COMPARISON:  None Available. MEDICATIONS: None. ANESTHESIA/SEDATION: Moderate (conscious) sedation was employed during this procedure. A total of Versed 0 mg and Fentanyl 100 mcg was administered intravenously. Moderate Sedation Time: 18 minutes. The patient's level of consciousness and vital signs were monitored continuously by radiology nursing throughout the procedure under my direct supervision. CONTRAST:  None FLUOROSCOPY TIME:  Nine mil COMPLICATIONS: None immediate. PROCEDURE: The procedure, risks, benefits, and alternatives were explained to the patient. Questions regarding the procedure were encouraged and answered. The patient understands and consents to the procedure. The right neck and chest were prepped with chlorhexidine in a sterile fashion, and a sterile  drape was applied covering the operative field. Maximum barrier sterile technique with sterile gowns and gloves were used for the procedure. A timeout was performed prior to the initiation of the procedure. Ultrasound was used to examine the jugular vein which was compressible and free of internal echoes. A skin marker was used to demarcate the planned venotomy and port pocket incision sites. Local anesthesia was provided to these sites and the subcutaneous tunnel track with 1% lidocaine with 1:100,000 epinephrine. A small incision was created at the jugular access site and blunt dissection was performed of the subcutaneous tissues. Under ultrasound guidance, the jugular vein was accessed with a 21 ga micropuncture needle and an 0.018" wire was inserted to the superior vena cava. Real-time ultrasound guidance was utilized for vascular access including the acquisition of a permanent ultrasound image documenting patency of the accessed vessel. A 5 Fr micopuncture set was then used, through which a 0.035" Rosen wire was passed under fluoroscopic guidance into the inferior vena cava. An 8 Fr dilator was then placed over the wire. A subcutaneous port pocket was then created along the upper chest wall utilizing a combination of sharp and  blunt dissection. The pocket was irrigated with sterile saline, packed with gauze, and observed for hemorrhage. A single lumen "ISP" sized power injectable port was chosen for placement. The 8 Fr catheter was tunneled from the port pocket site to the venotomy incision. The port was placed in the pocket. The external catheter was trimmed to appropriate length. The dilator was exchanged for an 8 Fr peel-away sheath under fluoroscopic guidance. The catheter was then placed through the sheath and the sheath was removed. Final catheter positioning was confirmed and documented with a fluoroscopic spot radiograph. The port was accessed with a Huber needle, aspirated, and flushed with heparinized  saline. The deep dermal layer of the port pocket incision was closed with interrupted 3-0 Vicryl suture. The skin was opposed with a running subcuticular 4-0 Monocryl suture. Dermabond was then placed over the port pocket and neck incisions. The patient tolerated the procedure well without immediate post procedural complication. FINDINGS: After catheter placement, the tip lies within the superior cavoatrial junction. The catheter aspirates and flushes normally and is ready for immediate use. IMPRESSION: Successful placement of a power injectable Port-A-Cath via the right internal jugular vein. The catheter is ready for immediate use. Ruthann Cancer, MD [facility] Electronically Signed   By: Ruthann Cancer M.D.   On: 04/26/2022 12:20   NM PET Image Initial (PI) Skull Base To Thigh  Result Date: 04/21/2022 CLINICAL DATA:  Initial treatment strategy for diffuse large B-cell lymphoma. EXAM: NUCLEAR MEDICINE PET SKULL BASE TO THIGH TECHNIQUE: 30.7 mCi F-18 FDG was injected intravenously. Full-ring PET imaging was performed from the skull base to thigh after the radiotracer. CT data was obtained and used for attenuation correction and anatomic localization. Fasting blood glucose: 119 mg/dl COMPARISON:  CT abdomen/pelvis dated 02/13/2022 FINDINGS: Mediastinal blood pool activity: SUV max 1.9 Liver activity: SUV max 3.8 NECK: Diffuse/widespread bilateral cervical lymphadenopathy. Index 13 mm short axis left level 2 node (series 4/image 32), max SUV 10.0. Incidental CT findings: None. CHEST: Small bilateral lower lobe pulmonary nodules measuring up to 9 mm (series 7/image 81), non FDG avid but possibly beneath the size threshold for PET sensitivity. Mediastinal and bilateral axillary lymphadenopathy. Index 12 mm short axis subcarinal node (series 4/image 74), max SUV 7.6. 10 mm short axis node along the left aspect of the descending thoracic aorta (series 4/image  89), max SUV 9.0. Incidental CT findings: Atherosclerotic calcifications of the arch. Severe three-vessel coronary atherosclerosis. ABDOMEN/PELVIS: Diffusely increased hypermetabolism involving the spleen, max SUV 5.1, without discrete lesion. No focal hypermetabolism in the liver, pancreas, or adrenal glands. Diffuse/multifocal lymphadenopathy in the right jejunal mesentery, retroperitoneum, pelvis, and bilateral inguinal regions. Index nodes include: --3.0 cm short axis left para-aortic node (series 4/image 133), max SUV 13.5 --3.2 cm short axis node in the central right jejunal mesentery (series 4/image 144), max SUV 12.3 --3.1 cm short axis right pelvic sidewall/external iliac nodal mass, max SUV 13.6 --2.9 cm short axis right inguinal node, max SUV 21.7 Postsurgical changes related to prior right inguinal nodal resection. Incidental CT findings: Atherosclerotic calcifications of the abdominal aorta and branch vessels. 1.8 cm posterior left upper pole renal cyst, likely benign. Thick-walled bladder, although underdistended. SKELETON: No focal hypermetabolic activity to suggest skeletal metastasis. Incidental CT findings: Mild degenerative changes of the visualized thoracolumbar spine. IMPRESSION: Widespread hypermetabolic lymphadenopathy in the neck, chest, abdomen, and pelvis, as described above. Postsurgical changes related to prior right inguinal nodal resection. Small bilateral lower lobe pulmonary nodules, non  FDG avid but beneath the size threshold for PET sensitivity. Pulmonary lymphoma is not excluded. Electronically Signed   By: Julian Hy M.D.   On: 04/21/2022 13:15   ECHOCARDIOGRAM COMPLETE  Result Date: 04/19/2022    ECHOCARDIOGRAM REPORT   Patient Name:   Alex Cortez Date of Exam: 04/19/2022 Medical Rec #:  [id number]        Height:       76.0 in Accession #:    [id number]       Weight:       275.6 lb Date of Birth:  21-May-1950        BSA:          2.539 m Patient Age:    57 years          BP:           137/67 mmHg Patient Gender: M                HR:           74 bpm. Exam Location:  Outpatient Procedure: 2D Echo, 3D Echo, Cardiac Doppler, Color Doppler and Strain Analysis Indications:    Diffuse large B-cell lymphoma of lymph nodes of inguinal region                 ([place]) [C83.35 (ICD-10-CM)]; Essential (primary) hypertension                 [I10 (ICD-10-CM)]  History:        Patient has prior history of Echocardiogram examinations, most                 recent 02/20/2018. Previous Myocardial Infarction and CAD,                 Stroke; Risk Factors:Hypertension, Diabetes and Dyslipidemia.  Sonographer:    Darlina Sicilian RDCS Referring Phys: [id number] [place]  1. Global longitudinal strain is -16.4% (Borderline).. Left ventricular ejection fraction, by estimation, is 55 to 60%. The left ventricle has normal function. The left ventricle has no regional wall motion abnormalities. Left ventricular diastolic parameters are consistent with Grade II diastolic dysfunction (pseudonormalization). The average left ventricular global longitudinal strain is -16.4 %. The global longitudinal strain is normal.  2. Right ventricular systolic function is normal. The right ventricular size is normal.  3. The mitral valve is grossly normal. No evidence of mitral valve regurgitation. No evidence of mitral stenosis.  4. The aortic valve is tricuspid. There is mild calcification of the aortic valve. Aortic valve regurgitation is not visualized. Aortic valve sclerosis/calcification is present, without any evidence of aortic stenosis.  5. The inferior vena cava is normal in size with greater than 50% respiratory variability, suggesting right atrial pressure of 3 mmHg. FINDINGS  Left Ventricle: Global longitudinal strain is -16.4% (Borderline). Left ventricular ejection fraction, by estimation, is 55 to 60%. The left ventricle has normal function. The left ventricle has no regional wall motion abnormalities.  The average left ventricular global longitudinal strain is -16.4 %. The global longitudinal strain is normal. The left ventricular internal cavity size was normal in size. There is no concentric left ventricular hypertrophy. Left ventricular diastolic parameters are consistent with Grade II diastolic dysfunction (pseudonormalization). Right Ventricle: The right ventricular size is normal. No increase in right ventricular wall thickness. Right ventricular systolic function is normal. Left Atrium: Left atrial size was normal in size. Right Atrium: Right atrial size was normal in size. Pericardium: Trivial pericardial  effusion is present. The pericardial effusion is circumferential. Mitral Valve: The mitral valve is grossly normal. There is mild calcification of the mitral valve leaflet(s). Mild mitral annular calcification. No evidence of mitral valve regurgitation. No evidence of mitral valve stenosis. Tricuspid Valve: The tricuspid valve is grossly normal. Tricuspid valve regurgitation is not demonstrated. No evidence of tricuspid stenosis. Aortic Valve: The aortic valve is tricuspid. There is mild calcification of the aortic valve. Aortic valve regurgitation is not visualized. Aortic valve sclerosis/calcification is present, without any evidence of aortic stenosis. Pulmonic Valve: The pulmonic valve was grossly normal. Pulmonic valve regurgitation is mild. No evidence of pulmonic stenosis. Aorta: The aortic root and ascending aorta are structurally normal, with no evidence of dilitation. Venous: The inferior vena cava is normal in size with greater than 50% respiratory variability, suggesting right atrial pressure of 3 mmHg. IAS/Shunts: No atrial level shunt detected by color flow Doppler.  LEFT VENTRICLE PLAX 2D LVIDd:         4.90 cm   Diastology LVIDs:         2.80 cm   LV e' medial:    4.78 cm/s LV PW:         1.10 cm   LV E/e' medial:  17.8 LV IVS:        1.10 cm   LV e' lateral:   8.51 cm/s LVOT diam:      2.10 cm   LV E/e' lateral: 10.0 LV SV:         66 LV SV Index:   26        2D Longitudinal Strain LVOT Area:     3.46 cm  2D Strain GLS (A2C):   -16.5 %                          2D Strain GLS (A3C):   -15.6 %                          2D Strain GLS (A4C):   -17.1 %                          2D Strain GLS Avg:     -16.4 %                           3D Volume EF:                          3D EF:        55 %                          LV EDV:       198 ml                          LV ESV:       88 ml                          LV SV:        110 ml RIGHT VENTRICLE RV S prime:     13.10 cm/s TAPSE (M-mode): 2.5 cm LEFT ATRIUM             Index  RIGHT ATRIUM           Index LA diam:        4.00 cm 1.58 cm/m   RA Area:     11.20 cm LA Vol (A2C):   39.7 ml 15.64 ml/m  RA Volume:   24.80 ml  9.77 ml/m LA Vol (A4C):   54.9 ml 21.63 ml/m LA Biplane Vol: 46.8 ml 18.44 ml/m  AORTIC VALVE             PULMONIC VALVE LVOT Vmax:   87.50 cm/s  PR End Diast Vel: 1.61 msec LVOT Vmean:  52.600 cm/s LVOT VTI:    0.190 m  AORTA Ao Root diam: 3.20 cm Ao Asc diam:  3.20 cm MITRAL VALVE MV Area (PHT): 3.79 cm    SHUNTS MV Decel Time: 200 msec    Systemic VTI:  0.19 m MV E velocity: 85.30 cm/s  Systemic Diam: 2.10 cm MV A velocity: 83.10 cm/s MV E/A ratio:  1.03 Dorris Carnes MD Electronically signed by Dorris Carnes MD Signature Date/Time: 04/19/2022/8:56:57 PM    Final     ASSESSMENT & PLAN Alex Cortez 72 y.o. male with medical history significant for newly diagnosed diffuse large B-cell lymphoma who presents for a follow up visit.  At this time findings are most consistent with diffuse large B-cell lymphoma stage III.  The difference tween stage III and stage IV is academic as there is no difference in treatment.  We will plan to proceed with 6 cycles of R-CHOP chemotherapy.  Chemotherapy will be administered every 3 weeks with G-CSF therapy to be provided on day 3.  Patient will be administered chemotherapy of full-strength.  His  echocardiogram shows good baseline cardiac function.  Also his labs are strong at baseline.  The patient voices understanding of the chemotherapy and the plan moving forward.  # Diffuse Large B Cell Lymphoma Stage III/IV -- At this time findings are most consistent with a diffuse large B-cell lymphoma arising from follicular lymphoma, stage III/IV.  Would favor stage 3 as there is no clear evidence of organ involvement, may have bone marrow involvement. -- Will proceed with R-CHOP chemotherapy.  Discussed the risks and benefits of this therapy as well as the anticipated side effects and scheduling. Plan: --plan was for Cycle 1 Day 1 of R-CHOP chemotherapy tomorrow. HELD due to surgical site concerns. -- Labs today show white blood cell count 5.2, hemoglobin 12.3, MCV 78.9, and platelets of 163 --plan for interval PET CT scan after Cycle 3.  --RTC in 3 weeks for Cycle 2 Day 1 of treatment.  # Wound Dehiscence/ Possible Wound Infection -- Urgently sent patient back to his surgeon today for further evaluation due to concern for his wound -- Will likely need to defer chemotherapy as he will be started on antibiotic therapy.  #Supportive Care -- chemotherapy education complete -- port placed -- zofran 51m q8H PRN and compazine 157mPO q6H for nausea -- allopurinol 30069mO daily for TLS prophylaxis -- EMLA cream for port -- no pain medication required at this time.    Orders Placed This Encounter  Procedures   Uric acid    Standing Status:   Future    Standing Expiration Date:   05/27/2023   Lactate dehydrogenase (LDH)    Standing Status:   Future    Standing Expiration Date:   05/27/2023   CBC with Differential ([facility]cer [facility] Only)    Standing Status:   Future  Standing Expiration Date:   05/28/2023   CMP ([facility] only)    Standing Status:   Future    Standing Expiration Date:   05/28/2023   Uric acid    Standing Status:   Future    Standing Expiration Date:   06/17/2023    Lactate dehydrogenase (LDH)    Standing Status:   Future    Standing Expiration Date:   06/17/2023   CBC with Differential ([facility]cer Center Only)    Standing Status:   Future    Standing Expiration Date:   06/18/2023   CMP ([facility] only)    Standing Status:   Future    Standing Expiration Date:   06/18/2023   Uric acid    Standing Status:   Future    Standing Expiration Date:   07/08/2023   Lactate dehydrogenase (LDH)    Standing Status:   Future    Standing Expiration Date:   07/08/2023   CBC with Differential ([facility]cer Center Only)    Standing Status:   Future    Standing Expiration Date:   07/09/2023   CMP ([facility] only)    Standing Status:   Future    Standing Expiration Date:   07/09/2023    All questions were answered. The patient knows to call the clinic with any problems, questions or concerns.  A total of more than 30 minutes were spent on this encounter with face-to-face time and non-face-to-face time, including preparing to see the patient, ordering tests and/or medications, counseling the patient and coordination of care as outlined above.   Ledell Peoples, MD Department of Hematology/Oncology [place] [facility] Phone: [phone] Pager: [phone] Email: [email or web address]  05/05/2022 5:10 PM

## 2022-05-06 ENCOUNTER — Inpatient Hospital Stay: Payer: Medicare Other

## 2022-05-06 ENCOUNTER — Other Ambulatory Visit (HOSPITAL_COMMUNITY): Payer: Medicare Other

## 2022-05-08 ENCOUNTER — Inpatient Hospital Stay: Payer: Medicare Other

## 2022-05-10 ENCOUNTER — Telehealth: Payer: Self-pay | Admitting: *Deleted

## 2022-05-10 NOTE — Telephone Encounter (Signed)
Staff called to say that Alex Cortez was evaluated and is OK to start chemotherapy.  LM for patient with next appts

## 2022-05-12 ENCOUNTER — Inpatient Hospital Stay (HOSPITAL_BASED_OUTPATIENT_CLINIC_OR_DEPARTMENT_OTHER): Payer: Medicare Other | Admitting: Physician Assistant

## 2022-05-12 ENCOUNTER — Other Ambulatory Visit: Payer: Self-pay

## 2022-05-12 ENCOUNTER — Inpatient Hospital Stay: Payer: Medicare Other

## 2022-05-12 VITALS — BP 108/59 | HR 89 | Temp 98.0°F | Resp 19 | Ht 75.0 in | Wt 282.1 lb

## 2022-05-12 DIAGNOSIS — C8338 Diffuse large B-cell lymphoma, lymph nodes of multiple sites: Secondary | ICD-10-CM

## 2022-05-12 DIAGNOSIS — C8335 Diffuse large B-cell lymphoma, lymph nodes of inguinal region and lower limb: Secondary | ICD-10-CM | POA: Diagnosis not present

## 2022-05-12 DIAGNOSIS — D509 Iron deficiency anemia, unspecified: Secondary | ICD-10-CM | POA: Diagnosis not present

## 2022-05-12 DIAGNOSIS — Z87891 Personal history of nicotine dependence: Secondary | ICD-10-CM | POA: Diagnosis not present

## 2022-05-12 DIAGNOSIS — E039 Hypothyroidism, unspecified: Secondary | ICD-10-CM | POA: Diagnosis not present

## 2022-05-12 DIAGNOSIS — I10 Essential (primary) hypertension: Secondary | ICD-10-CM | POA: Diagnosis not present

## 2022-05-12 DIAGNOSIS — Z95828 Presence of other vascular implants and grafts: Secondary | ICD-10-CM

## 2022-05-12 DIAGNOSIS — E119 Type 2 diabetes mellitus without complications: Secondary | ICD-10-CM | POA: Diagnosis not present

## 2022-05-12 LAB — CMP (CANCER CENTER ONLY)
ALT: 33 U/L (ref 0–44)
AST: 29 U/L (ref 15–41)
Albumin: 3.6 g/dL (ref 3.5–5.0)
Alkaline Phosphatase: 81 U/L (ref 38–126)
Anion gap: 5 (ref 5–15)
BUN: 21 mg/dL (ref 8–23)
CO2: 25 mmol/L (ref 22–32)
Calcium: 8.6 mg/dL — ABNORMAL LOW (ref 8.9–10.3)
Chloride: 107 mmol/L (ref 98–111)
Creatinine: 1.38 mg/dL — ABNORMAL HIGH (ref 0.61–1.24)
GFR, Estimated: 54 mL/min — ABNORMAL LOW (ref >60)
Glucose, Bld: 108 mg/dL — ABNORMAL HIGH (ref 70–99)
Potassium: 4.6 mmol/L (ref 3.5–5.1)
Sodium: 137 mmol/L (ref 135–145)
Total Bilirubin: 0.9 mg/dL (ref 0.3–1.2)
Total Protein: 6.3 g/dL — ABNORMAL LOW (ref 6.5–8.1)

## 2022-05-12 LAB — CBC WITH DIFFERENTIAL (CANCER CENTER ONLY)
Abs Immature Granulocytes: 0.02 10*3/uL (ref 0.00–0.07)
Basophils Absolute: 0 10*3/uL (ref 0.0–0.1)
Basophils Relative: 1 %
Eosinophils Absolute: 0.2 10*3/uL (ref 0.0–0.5)
Eosinophils Relative: 5 %
HCT: 35.2 % — ABNORMAL LOW (ref 39.0–52.0)
Hemoglobin: 11.5 g/dL — ABNORMAL LOW (ref 13.0–17.0)
Immature Granulocytes: 0 %
Lymphocytes Relative: 35 %
Lymphs Abs: 1.6 10*3/uL (ref 0.7–4.0)
MCH: 25.6 pg — ABNORMAL LOW (ref 26.0–34.0)
MCHC: 32.7 g/dL (ref 30.0–36.0)
MCV: 78.2 fL — ABNORMAL LOW (ref 80.0–100.0)
Monocytes Absolute: 0.7 10*3/uL (ref 0.1–1.0)
Monocytes Relative: 14 %
Neutro Abs: 2.1 10*3/uL (ref 1.7–7.7)
Neutrophils Relative %: 45 %
Platelet Count: 192 10*3/uL (ref 150–400)
RBC: 4.5 MIL/uL (ref 4.22–5.81)
RDW: 15.1 % (ref 11.5–15.5)
WBC Count: 4.6 10*3/uL (ref 4.0–10.5)
nRBC: 0 % (ref 0.0–0.2)

## 2022-05-12 LAB — IRON AND IRON BINDING CAPACITY (CC-WL,HP ONLY)
Iron: 55 ug/dL (ref 45–182)
Saturation Ratios: 20 % (ref 17.9–39.5)
TIBC: 270 ug/dL (ref 250–450)
UIBC: 215 ug/dL (ref 117–376)

## 2022-05-12 LAB — FERRITIN: Ferritin: 168 ng/mL (ref 24–336)

## 2022-05-12 LAB — LACTATE DEHYDROGENASE: LDH: 707 U/L — ABNORMAL HIGH (ref 98–192)

## 2022-05-12 MED ORDER — HEPARIN SOD (PORK) LOCK FLUSH 100 UNIT/ML IV SOLN
500.0000 [IU] | Freq: Once | INTRAVENOUS | Status: AC
  Administered 2022-05-12: 500 [IU]

## 2022-05-12 MED ORDER — SODIUM CHLORIDE 0.9% FLUSH
10.0000 mL | Freq: Once | INTRAVENOUS | Status: AC
  Administered 2022-05-12: 10 mL

## 2022-05-12 NOTE — Progress Notes (Signed)
[place] [phone]) [phone]   [phone]  PROGRESS NOTE  Patient Care Team: Curlene Labrum, MD as PCP - General Branch, Alphonse Guild, MD as PCP - Cardiology (Cardiology) Cassandria Anger, MD as Consulting Physician (Endocrinology) Ilean China, RN as [place] History # Diffuse Large B Cell Lymphoma, Stage III 03/16/2022: establish care with Dr. Lorenso Courier in [facility] 04/14/2022: excisional lymph node biopsy of right inguinal node shows DLBCL arising from follicular lymphoma 0000000: PET CT scan showed widespread hypermetabolic lymphadenopathy in the neck, chest, abdomen, and pelvis 05/06/2021: Defer Cycle 1 Day 1 of R-CHOP chemotherapy. Patient has wound dehiscence and possible infection.  Seen by surgery same day.  Interval History:  Alex Cortez 72 y.o. male with medical history significant for newly diagnosed diffuse large B-cell lymphoma who presents for a follow up visit. The patient's last visit was on 05/05/2022. In the interim since the last visit he completed antibiotic therapy for the right inguinal wound.   On exam today Alex Cortez is accompanied by his wife.  He reports    that he has been well overall interim since her last visit.  He is not having any nausea, vomiting, or diarrhea.  He is not having any fevers, chills, sweats.  He reports he has not noticed any new lymphadenopathy.  He is eating well and his bowels are moving regularly.  He is undergoing chemotherapy education and port placement.  He reports no difficulties with his port and he found chemotherapy education to be informative.  He otherwise denies any new concerning findings in the interim since her last visit.  He did however report that he is concerned about some discomfort and pain he is having at his prior lymph node excisional biopsy site.  We did examine that today which did show signs concerning  for infection and therefore we recommended him to urgently see his surgeon.  We were able to get him an appointment for later today.   MEDICAL HISTORY:  Past Medical History:  Diagnosis Date   CAD (coronary artery disease)    nonobstructive CAD/patent LAD stent site; EF 65%, 08/2011; NSTEMI/DES LAD, 6/20 11   Chest pain    Diabetes mellitus    insulin-dependent   Dyslipidemia    HTN (hypertension)    Hypothyroidism    Myocardial infarction ([place]) 2011   Post MI syndrome ([place]) 08/29/2009   Stroke ([place]) 10/22/2012   Left inferior pons    SURGICAL HISTORY: Past Surgical History:  Procedure Laterality Date   COLONOSCOPY  08/2021   CORONARY ANGIOPLASTY WITH STENT PLACEMENT  2011   IR IMAGING GUIDED PORT INSERTION  04/26/2022   LYMPH NODE BIOPSY Right 04/14/2022   Procedure: RIGHT INGUINAL EXCISIONAL LYMPH NODE BIOPSY;  Surgeon: Dwan Bolt, MD;  Location: [facility];  Service: General;  Laterality: Right;    SOCIAL HISTORY: Social History   Socioeconomic History   Marital status: Married    Spouse name: Not on file   Number of children: Not on file   Years of education: Not on file   Highest education level: Not on file  Occupational History   Not on file  Tobacco Use   Smoking status: Former    Packs/day: 0.50    Years: 23.00    Total pack years: 11.50    Types: Cigarettes    Start date: 03/16/1955    Quit date: 03/15/1988    Years since quitting: 50.1  Smokeless tobacco: Never  Vaping Use   Vaping Use: Never used  Substance and Sexual Activity   Alcohol use: No    Alcohol/week: 0.0 standard drinks of alcohol   Drug use: No   Sexual activity: Not on file  Other Topics Concern   Not on file  Social History Narrative   Works at [employer]. Married.    Social Determinants of Health   Financial Resource Strain: Low Risk  (03/17/2022)   Overall Financial Resource Strain (CARDIA)    Difficulty of Paying Living Expenses: Not hard at all  Food Insecurity: Unknown  (12/01/2021)   Hunger Vital Sign    Worried About Running Out of Food in the Last Year: Not on file    Ran Out of Food in the Last Year: Never true  Transportation Needs: No Transportation Needs (04/07/2022)   PRAPARE - Hydrologist (Medical): No    Lack of Transportation (Non-Medical): No  Physical Activity: Not on file  Stress: No Stress Concern Present (04/07/2022)   [place]    Feeling of Stress : Not at all  Social Connections: Unknown (07/31/2017)   Social Connection and Isolation Panel [NHANES]    Frequency of Communication with Friends and Family: Patient refused    Frequency of Social Gatherings with Friends and Family: Patient refused    Attends Religious Services: Patient refused    Active Member of Clubs or Organizations: Patient refused    Attends Archivist Meetings: Patient refused    Marital Status: Patient refused  Intimate Partner Violence: Unknown (07/31/2017)   Humiliation, Afraid, Rape, and Kick questionnaire    Fear of Current or Ex-Partner: Patient refused    Emotionally Abused: Patient refused    Physically Abused: Patient refused    Sexually Abused: Patient refused    FAMILY HISTORY: Family History  Problem Relation Age of Onset   Colon cancer Mother    Hypertension Other        in all family members    ALLERGIES:  is allergic to ace inhibitors and shrimp [shellfish allergy].  MEDICATIONS:  Current Outpatient Medications  Medication Sig Dispense Refill   allopurinol (ZYLOPRIM) 300 MG tablet Take 1 tablet (300 mg total) by mouth daily. 90 tablet 1   amLODipine (NORVASC) 10 MG tablet Take 10 mg by mouth daily. for high blood pressure     carvedilol (COREG) 25 MG tablet Take 1 tablet (25 mg total) by mouth 2 (two) times daily. 180 tablet 3   clopidogrel (PLAVIX) 75 MG tablet Take 1 tablet (75 mg total) by mouth daily. 30 tablet 5   Continuous Blood  Gluc Receiver (FREESTYLE LIBRE 2 READER) DEVI As directed 1 each 0   Continuous Blood Gluc Sensor (FREESTYLE LIBRE 2 SENSOR) MISC APPLY 1 SENSOR EVERY 14    DAYS 6 each 0   ezetimibe (ZETIA) 10 MG tablet TAKE 1 TABLET BY MOUTH EVERY DAY 90 tablet 2   FARXIGA 5 MG TABS tablet TAKE 1 TABLET BY MOUTH EVERY DAY BEFORE BREAKFAST 30 tablet 2   fluticasone (FLONASE) 50 MCG/ACT nasal spray Place 2 sprays into both nostrils daily as needed for allergies.     insulin regular human CONCENTRATED (HUMULIN R U-500 KWIKPEN) 500 UNIT/ML KwikPen INJECT AS DIRECTED 70 UNITS WITH BREAKFAST, 70 UNITS WITH LUNCH, AND 70 UNITS WITH SUPPER FOR PRE-MEAL BLOOD GLUCOSE READINGS OF '90MG'$ /DL OR ABOVE 45 mL 0   isosorbide mononitrate (IMDUR)  30 MG 24 hr tablet TAKE 1 TABLET BY MOUTH EVERY DAY 90 tablet 1   levothyroxine (SYNTHROID) 75 MCG tablet TAKE 1 TABLET BY MOUTH EVERY DAY BEFORE BREAKFAST 90 tablet 0   lidocaine-prilocaine (EMLA) cream Apply 1 Application topically as needed. 30 g 0   losartan (COZAAR) 50 MG tablet Take 1 tablet (50 mg total) by mouth daily.     metFORMIN (GLUCOPHAGE) 500 MG tablet Take 1 tablet (500 mg total) by mouth daily with breakfast. 90 tablet 1   nitroGLYCERIN (NITROSTAT) 0.4 MG SL tablet Place 1 tablet (0.4 mg total) under the tongue every 5 (five) minutes as needed. 25 tablet 3   ondansetron (ZOFRAN) 8 MG tablet Take 1 tablet (8 mg total) by mouth every 8 (eight) hours as needed. 30 tablet 0   ONE TOUCH ULTRA TEST test strip USE TO TEST BLOOD SUGAR FOUR TIMES DAILY E11.65 150 each 5   predniSONE (DELTASONE) 20 MG tablet Take 3 tablets (60 mg total) by mouth daily with breakfast. Take on Day 1 of chemotherapy and continue until Day 5. Repeat with each chemotherapy treatment. 15 tablet 5   prochlorperazine (COMPAZINE) 10 MG tablet Take 1 tablet (10 mg total) by mouth every 6 (six) hours as needed for nausea or vomiting. 30 tablet 0   rosuvastatin (CRESTOR) 40 MG tablet TAKE 1 TABLET BY MOUTH EVERY  DAY 90 tablet 2   No current facility-administered medications for this visit.    REVIEW OF SYSTEMS:   Constitutional: ( - ) fevers, ( - )  chills , ( - ) night sweats Eyes: ( - ) blurriness of vision, ( - ) double vision, ( - ) watery eyes Ears, nose, mouth, throat, and face: ( - ) mucositis, ( - ) sore throat Respiratory: ( - ) cough, ( - ) dyspnea, ( - ) wheezes Cardiovascular: ( - ) palpitation, ( - ) chest discomfort, ( - ) lower extremity swelling Gastrointestinal:  ( - ) nausea, ( - ) heartburn, ( - ) change in bowel habits Skin: ( - ) abnormal skin rashes Lymphatics: ( - ) new lymphadenopathy, ( - ) easy bruising Neurological: ( - ) numbness, ( - ) tingling, ( - ) new weaknesses Behavioral/Psych: ( - ) mood change, ( - ) new changes  All other systems were reviewed with the patient and are negative.  PHYSICAL EXAMINATION: ECOG PERFORMANCE STATUS: 0 - Asymptomatic  Vitals:   05/12/22 1304  BP: (!) 108/59  Pulse: 89  Resp: 19  Temp: 98 F (36.7 C)  SpO2: 97%    Filed Weights   05/12/22 1304  Weight: 282 lb 1.6 oz (128 kg)     GENERAL: Well-appearing elderly African-American male alert, no distress and comfortable SKIN: skin color, texture, turgor are normal, no rashes or significant lesions. Inguinal wound is healing, no discharged noted.  EYES: conjunctiva are pink and non-injected, sclera clear NECK: supple, non-tender LYMPH:   palpable lymphadenopathy in the cervical and axillary nodes.  LUNGS: clear to auscultation and percussion with normal breathing effort HEART: regular rate & rhythm and no murmurs and no lower extremity edema Musculoskeletal: no cyanosis of digits and no clubbing  PSYCH: alert & oriented x 3, fluent speech NEURO: no focal motor/sensory deficits  LABORATORY DATA:  I have reviewed the data as listed    Latest Ref Rng & Units 05/12/2022   12:25 PM 05/05/2022    9:17 AM 04/23/2022    2:19 PM  CBC  WBC 4.0 -  10.5 K/uL 4.6  4.1  5.2    Hemoglobin 13.0 - 17.0 g/dL 11.5  12.1  12.3   Hematocrit 39.0 - 52.0 % 35.2  37.0  37.8   Platelets 150 - 400 K/uL 192  197  163        Latest Ref Rng & Units 05/12/2022   12:25 PM 05/05/2022    9:17 AM 04/23/2022    2:19 PM  CMP  Glucose 70 - 99 mg/dL 108  174  83   BUN 8 - 23 mg/dL '21  17  17   '$ Creatinine 0.61 - 1.24 mg/dL 1.38  1.12  1.22   Sodium 135 - 145 mmol/L 137  140  142   Potassium 3.5 - 5.1 mmol/L 4.6  4.3  4.0   Chloride 98 - 111 mmol/L 107  108  108   CO2 22 - 32 mmol/L '25  26  28   '$ Calcium 8.9 - 10.3 mg/dL 8.6  8.4  8.9   Total Protein 6.5 - 8.1 g/dL 6.3  6.8  7.1   Total Bilirubin 0.3 - 1.2 mg/dL 0.9  0.8  1.0   Alkaline Phos 38 - 126 U/L 81  64  54   AST 15 - 41 U/L '29  22  19   '$ ALT 0 - 44 U/L 33  23  23     RADIOGRAPHIC STUDIES: I have personally reviewed the radiological images as listed and agreed with the findings in the report: Diffuse involving the lymph nodes in the neck, underarms, chest, abdomen and groin. IR IMAGING GUIDED PORT INSERTION  Result Date: 04/26/2022 INDICATION: 72 year old male with history of diffuse large B-cell lymphoma presenting for Port-A-Cath placement. EXAM: IMPLANTED PORT A CATH PLACEMENT WITH ULTRASOUND AND FLUOROSCOPIC GUIDANCE COMPARISON:  None Available. MEDICATIONS: None. ANESTHESIA/SEDATION: Moderate (conscious) sedation was employed during this procedure. A total of Versed 0 mg and Fentanyl 100 mcg was administered intravenously. Moderate Sedation Time: 18 minutes. The patient's level of consciousness and vital signs were monitored continuously by radiology nursing throughout the procedure under my direct supervision. CONTRAST:  None FLUOROSCOPY TIME:  Nine mil COMPLICATIONS: None immediate. PROCEDURE: The procedure, risks, benefits, and alternatives were explained to the patient. Questions regarding the procedure were encouraged and answered. The patient understands and consents to the procedure. The right neck and chest were  prepped with chlorhexidine in a sterile fashion, and a sterile drape was applied covering the operative field. Maximum barrier sterile technique with sterile gowns and gloves were used for the procedure. A timeout was performed prior to the initiation of the procedure. Ultrasound was used to examine the jugular vein which was compressible and free of internal echoes. A skin marker was used to demarcate the planned venotomy and port pocket incision sites. Local anesthesia was provided to these sites and the subcutaneous tunnel track with 1% lidocaine with 1:100,000 epinephrine. A small incision was created at the jugular access site and blunt dissection was performed of the subcutaneous tissues. Under ultrasound guidance, the jugular vein was accessed with a 21 ga micropuncture needle and an 0.018" wire was inserted to the superior vena cava. Real-time ultrasound guidance was utilized for vascular access including the acquisition of a permanent ultrasound image documenting patency of the accessed vessel. A 5 Fr micopuncture set was then used, through which a 0.035" Rosen wire was passed under fluoroscopic guidance into the inferior vena cava. An 8 Fr dilator was then placed over the wire. A subcutaneous port pocket was then  created along the upper chest wall utilizing a combination of sharp and blunt dissection. The pocket was irrigated with sterile saline, packed with gauze, and observed for hemorrhage. A single lumen "ISP" sized power injectable port was chosen for placement. The 8 Fr catheter was tunneled from the port pocket site to the venotomy incision. The port was placed in the pocket. The external catheter was trimmed to appropriate length. The dilator was exchanged for an 8 Fr peel-away sheath under fluoroscopic guidance. The catheter was then placed through the sheath and the sheath was removed. Final catheter positioning was confirmed and documented with a fluoroscopic spot radiograph. The port was  accessed with a Huber needle, aspirated, and flushed with heparinized saline. The deep dermal layer of the port pocket incision was closed with interrupted 3-0 Vicryl suture. The skin was opposed with a running subcuticular 4-0 Monocryl suture. Dermabond was then placed over the port pocket and neck incisions. The patient tolerated the procedure well without immediate post procedural complication. FINDINGS: After catheter placement, the tip lies within the superior cavoatrial junction. The catheter aspirates and flushes normally and is ready for immediate use. IMPRESSION: Successful placement of a power injectable Port-A-Cath via the right internal jugular vein. The catheter is ready for immediate use. Ruthann Cancer, MD [facility] Electronically Signed   By: Ruthann Cancer M.D.   On: 04/26/2022 12:20   NM PET Image Initial (PI) Skull Base To Thigh  Result Date: 04/21/2022 CLINICAL DATA:  Initial treatment strategy for diffuse large B-cell lymphoma. EXAM: NUCLEAR MEDICINE PET SKULL BASE TO THIGH TECHNIQUE: 30.7 mCi F-18 FDG was injected intravenously. Full-ring PET imaging was performed from the skull base to thigh after the radiotracer. CT data was obtained and used for attenuation correction and anatomic localization. Fasting blood glucose: 119 mg/dl COMPARISON:  CT abdomen/pelvis dated 02/13/2022 FINDINGS: Mediastinal blood pool activity: SUV max 1.9 Liver activity: SUV max 3.8 NECK: Diffuse/widespread bilateral cervical lymphadenopathy. Index 13 mm short axis left level 2 node (series 4/image 32), max SUV 10.0. Incidental CT findings: None. CHEST: Small bilateral lower lobe pulmonary nodules measuring up to 9 mm (series 7/image 81), non FDG avid but possibly beneath the size threshold for PET sensitivity. Mediastinal and bilateral axillary lymphadenopathy. Index 12 mm short axis subcarinal node (series 4/image 74), max SUV 7.6. 10 mm short axis node  along the left aspect of the descending thoracic aorta (series 4/image 89), max SUV 9.0. Incidental CT findings: Atherosclerotic calcifications of the arch. Severe three-vessel coronary atherosclerosis. ABDOMEN/PELVIS: Diffusely increased hypermetabolism involving the spleen, max SUV 5.1, without discrete lesion. No focal hypermetabolism in the liver, pancreas, or adrenal glands. Diffuse/multifocal lymphadenopathy in the right jejunal mesentery, retroperitoneum, pelvis, and bilateral inguinal regions. Index nodes include: --3.0 cm short axis left para-aortic node (series 4/image 133), max SUV 13.5 --3.2 cm short axis node in the central right jejunal mesentery (series 4/image 144), max SUV 12.3 --3.1 cm short axis right pelvic sidewall/external iliac nodal mass, max SUV 13.6 --2.9 cm short axis right inguinal node, max SUV 21.7 Postsurgical changes related to prior right inguinal nodal resection. Incidental CT findings: Atherosclerotic calcifications of the abdominal aorta and branch vessels. 1.8 cm posterior left upper pole renal cyst, likely benign. Thick-walled bladder, although underdistended. SKELETON: No focal hypermetabolic activity to suggest skeletal metastasis. Incidental CT findings: Mild degenerative changes of the visualized thoracolumbar spine. IMPRESSION: Widespread hypermetabolic lymphadenopathy in the neck, chest, abdomen, and pelvis, as described above. Postsurgical changes related to  prior right inguinal nodal resection. Small bilateral lower lobe pulmonary nodules, non FDG avid but beneath the size threshold for PET sensitivity. Pulmonary lymphoma is not excluded. Electronically Signed   By: Julian Hy M.D.   On: 04/21/2022 13:15   ECHOCARDIOGRAM COMPLETE  Result Date: 04/19/2022    ECHOCARDIOGRAM REPORT   Patient Name:   OLVIN LEVECK Date of Exam: 04/19/2022 Medical Rec #:  [id number]        Height:       76.0 in Accession #:    [id number]       Weight:       275.6 lb Date of Birth:   April 18, 1950        BSA:          2.539 m Patient Age:    69 years         BP:           137/67 mmHg Patient Gender: M                HR:           74 bpm. Exam Location:  Outpatient Procedure: 2D Echo, 3D Echo, Cardiac Doppler, Color Doppler and Strain Analysis Indications:    Diffuse large B-cell lymphoma of lymph nodes of inguinal region                 ([place]) [C83.35 (ICD-10-CM)]; Essential (primary) hypertension                 [I10 (ICD-10-CM)]  History:        Patient has prior history of Echocardiogram examinations, most                 recent 02/20/2018. Previous Myocardial Infarction and CAD,                 Stroke; Risk Factors:Hypertension, Diabetes and Dyslipidemia.  Sonographer:    Darlina Sicilian RDCS Referring Phys: [id number] [place]  1. Global longitudinal strain is -16.4% (Borderline).. Left ventricular ejection fraction, by estimation, is 55 to 60%. The left ventricle has normal function. The left ventricle has no regional wall motion abnormalities. Left ventricular diastolic parameters are consistent with Grade II diastolic dysfunction (pseudonormalization). The average left ventricular global longitudinal strain is -16.4 %. The global longitudinal strain is normal.  2. Right ventricular systolic function is normal. The right ventricular size is normal.  3. The mitral valve is grossly normal. No evidence of mitral valve regurgitation. No evidence of mitral stenosis.  4. The aortic valve is tricuspid. There is mild calcification of the aortic valve. Aortic valve regurgitation is not visualized. Aortic valve sclerosis/calcification is present, without any evidence of aortic stenosis.  5. The inferior vena cava is normal in size with greater than 50% respiratory variability, suggesting right atrial pressure of 3 mmHg. FINDINGS  Left Ventricle: Global longitudinal strain is -16.4% (Borderline). Left ventricular ejection fraction, by estimation, is 55 to 60%. The left ventricle has normal  function. The left ventricle has no regional wall motion abnormalities. The average left ventricular global longitudinal strain is -16.4 %. The global longitudinal strain is normal. The left ventricular internal cavity size was normal in size. There is no concentric left ventricular hypertrophy. Left ventricular diastolic parameters are consistent with Grade II diastolic dysfunction (pseudonormalization). Right Ventricle: The right ventricular size is normal. No increase in right ventricular wall thickness. Right ventricular systolic function is normal. Left Atrium: Left atrial size was normal in size.  Right Atrium: Right atrial size was normal in size. Pericardium: Trivial pericardial effusion is present. The pericardial effusion is circumferential. Mitral Valve: The mitral valve is grossly normal. There is mild calcification of the mitral valve leaflet(s). Mild mitral annular calcification. No evidence of mitral valve regurgitation. No evidence of mitral valve stenosis. Tricuspid Valve: The tricuspid valve is grossly normal. Tricuspid valve regurgitation is not demonstrated. No evidence of tricuspid stenosis. Aortic Valve: The aortic valve is tricuspid. There is mild calcification of the aortic valve. Aortic valve regurgitation is not visualized. Aortic valve sclerosis/calcification is present, without any evidence of aortic stenosis. Pulmonic Valve: The pulmonic valve was grossly normal. Pulmonic valve regurgitation is mild. No evidence of pulmonic stenosis. Aorta: The aortic root and ascending aorta are structurally normal, with no evidence of dilitation. Venous: The inferior vena cava is normal in size with greater than 50% respiratory variability, suggesting right atrial pressure of 3 mmHg. IAS/Shunts: No atrial level shunt detected by color flow Doppler.  LEFT VENTRICLE PLAX 2D LVIDd:         4.90 cm   Diastology LVIDs:         2.80 cm   LV e' medial:    4.78 cm/s LV PW:         1.10 cm   LV E/e' medial:   17.8 LV IVS:        1.10 cm   LV e' lateral:   8.51 cm/s LVOT diam:     2.10 cm   LV E/e' lateral: 10.0 LV SV:         66 LV SV Index:   26        2D Longitudinal Strain LVOT Area:     3.46 cm  2D Strain GLS (A2C):   -16.5 %                          2D Strain GLS (A3C):   -15.6 %                          2D Strain GLS (A4C):   -17.1 %                          2D Strain GLS Avg:     -16.4 %                           3D Volume EF:                          3D EF:        55 %                          LV EDV:       198 ml                          LV ESV:       88 ml                          LV SV:        110 ml RIGHT VENTRICLE RV S prime:     13.10 cm/s TAPSE (M-mode): 2.5 cm LEFT ATRIUM  Index        RIGHT ATRIUM           Index LA diam:        4.00 cm 1.58 cm/m   RA Area:     11.20 cm LA Vol (A2C):   39.7 ml 15.64 ml/m  RA Volume:   24.80 ml  9.77 ml/m LA Vol (A4C):   54.9 ml 21.63 ml/m LA Biplane Vol: 46.8 ml 18.44 ml/m  AORTIC VALVE             PULMONIC VALVE LVOT Vmax:   87.50 cm/s  PR End Diast Vel: 1.61 msec LVOT Vmean:  52.600 cm/s LVOT VTI:    0.190 m  AORTA Ao Root diam: 3.20 cm Ao Asc diam:  3.20 cm MITRAL VALVE MV Area (PHT): 3.79 cm    SHUNTS MV Decel Time: 200 msec    Systemic VTI:  0.19 m MV E velocity: 85.30 cm/s  Systemic Diam: 2.10 cm MV A velocity: 83.10 cm/s MV E/A ratio:  1.03 Dorris Carnes MD Electronically signed by Dorris Carnes MD Signature Date/Time: 04/19/2022/8:56:57 PM    Final     ASSESSMENT & PLAN Alex Cortez 72 y.o. male with medical history significant for newly diagnosed diffuse large B-cell lymphoma who presents for a follow up visit.  At this time findings are most consistent with diffuse large B-cell lymphoma stage III.  The difference tween stage III and stage IV is academic as there is no difference in treatment.  We will plan to proceed with 6 cycles of R-CHOP chemotherapy.  Chemotherapy will be administered every 3 weeks with G-CSF therapy to be provided on  day 3.  Patient will be administered chemotherapy of full-strength.  His echocardiogram shows good baseline cardiac function.  Also his labs are strong at baseline.  The patient voices understanding of the chemotherapy and the plan moving forward.  # Diffuse Large B Cell Lymphoma Stage III/IV -- At this time findings are most consistent with a diffuse large B-cell lymphoma arising from follicular lymphoma, stage III/IV.  Would favor stage 3 as there is no clear evidence of organ involvement, may have bone marrow involvement. -- Will proceed with R-CHOP chemotherapy.  Discussed the risks and benefits of this therapy as well as the anticipated side effects and scheduling. Plan: --plan for Cycle 1 Day 1 of R-CHOP chemotherapy on 05/14/2022 -- Labs today show white blood cell count 4.6, hemoglobin 11.5, MCV 78.2, and platelets of 192 --Since wound has improved and no longer open or draining, okay to proceed with treatment as planned.  --plan for interval PET CT scan after Cycle 3.  --RTC in 3 weeks for Cycle 2 Day 1 of treatment.  #Microcytic anemia: --Labs today show Hgb 11.5, MCV 78.2. --Checking iron panel today.   # Wound Dehiscence/ Possible Wound Infection--improving --Completed antibiotic therapy with Bactrim. --Was evaluated by surgical team on 05/10/2022 with improvement of wound. They gave clearance to start chemotherapy scheduled for 05/14/2022.   #Supportive Care -- chemotherapy education complete -- port placed -- zofran '8mg'$  q8H PRN and compazine '10mg'$  PO q6H for nausea -- allopurinol '300mg'$  PO daily for TLS prophylaxis -- EMLA cream for port -- no pain medication required at this time.    Orders Placed This Encounter  Procedures   Iron and Iron Binding Capacity ([facility] only)    Standing Status:   Future    Number of Occurrences:   1    Standing Expiration Date:  05/13/2023   Ferritin    Standing Status:   Future    Number of Occurrences:   1    Standing Expiration Date:    05/13/2023    All questions were answered. The patient knows to call the clinic with any problems, questions or concerns.  A total of more than 30 minutes were spent on this encounter with face-to-face time and non-face-to-face time, including preparing to see the patient, ordering tests and/or medications, counseling the patient and coordination of care as outlined above.   Dede Query PA-C Dept of Hematology and [place] at [facility] Phone: [phone]   05/12/2022 2:17 PM

## 2022-05-13 ENCOUNTER — Encounter: Payer: Self-pay | Admitting: Hematology and Oncology

## 2022-05-13 MED FILL — Fosaprepitant Dimeglumine For IV Infusion 150 MG (Base Eq): INTRAVENOUS | Qty: 5 | Status: AC

## 2022-05-13 MED FILL — Dexamethasone Sodium Phosphate Inj 100 MG/10ML: INTRAMUSCULAR | Qty: 1 | Status: AC

## 2022-05-14 ENCOUNTER — Telehealth: Payer: Self-pay

## 2022-05-14 ENCOUNTER — Inpatient Hospital Stay: Payer: Medicare Other | Attending: Physician Assistant

## 2022-05-14 ENCOUNTER — Inpatient Hospital Stay (HOSPITAL_BASED_OUTPATIENT_CLINIC_OR_DEPARTMENT_OTHER): Payer: Medicare Other | Admitting: Physician Assistant

## 2022-05-14 ENCOUNTER — Other Ambulatory Visit: Payer: Self-pay

## 2022-05-14 VITALS — BP 127/81 | HR 82 | Temp 98.0°F | Resp 16 | Wt 282.5 lb

## 2022-05-14 DIAGNOSIS — T50905A Adverse effect of unspecified drugs, medicaments and biological substances, initial encounter: Secondary | ICD-10-CM | POA: Diagnosis not present

## 2022-05-14 DIAGNOSIS — Z5111 Encounter for antineoplastic chemotherapy: Secondary | ICD-10-CM | POA: Insufficient documentation

## 2022-05-14 DIAGNOSIS — E785 Hyperlipidemia, unspecified: Secondary | ICD-10-CM | POA: Diagnosis not present

## 2022-05-14 DIAGNOSIS — Z5189 Encounter for other specified aftercare: Secondary | ICD-10-CM | POA: Diagnosis not present

## 2022-05-14 DIAGNOSIS — C8335 Diffuse large B-cell lymphoma, lymph nodes of inguinal region and lower limb: Secondary | ICD-10-CM | POA: Diagnosis not present

## 2022-05-14 DIAGNOSIS — I252 Old myocardial infarction: Secondary | ICD-10-CM | POA: Insufficient documentation

## 2022-05-14 DIAGNOSIS — I2510 Atherosclerotic heart disease of native coronary artery without angina pectoris: Secondary | ICD-10-CM | POA: Diagnosis not present

## 2022-05-14 DIAGNOSIS — Z7902 Long term (current) use of antithrombotics/antiplatelets: Secondary | ICD-10-CM | POA: Insufficient documentation

## 2022-05-14 DIAGNOSIS — Z794 Long term (current) use of insulin: Secondary | ICD-10-CM | POA: Insufficient documentation

## 2022-05-14 DIAGNOSIS — E119 Type 2 diabetes mellitus without complications: Secondary | ICD-10-CM | POA: Diagnosis not present

## 2022-05-14 DIAGNOSIS — D509 Iron deficiency anemia, unspecified: Secondary | ICD-10-CM | POA: Diagnosis not present

## 2022-05-14 DIAGNOSIS — Z8673 Personal history of transient ischemic attack (TIA), and cerebral infarction without residual deficits: Secondary | ICD-10-CM | POA: Diagnosis not present

## 2022-05-14 DIAGNOSIS — Z87891 Personal history of nicotine dependence: Secondary | ICD-10-CM | POA: Diagnosis not present

## 2022-05-14 DIAGNOSIS — Z7952 Long term (current) use of systemic steroids: Secondary | ICD-10-CM | POA: Insufficient documentation

## 2022-05-14 DIAGNOSIS — Z7984 Long term (current) use of oral hypoglycemic drugs: Secondary | ICD-10-CM | POA: Insufficient documentation

## 2022-05-14 DIAGNOSIS — Z5112 Encounter for antineoplastic immunotherapy: Secondary | ICD-10-CM | POA: Insufficient documentation

## 2022-05-14 DIAGNOSIS — E039 Hypothyroidism, unspecified: Secondary | ICD-10-CM | POA: Diagnosis not present

## 2022-05-14 DIAGNOSIS — C8338 Diffuse large B-cell lymphoma, lymph nodes of multiple sites: Secondary | ICD-10-CM

## 2022-05-14 DIAGNOSIS — Z79899 Other long term (current) drug therapy: Secondary | ICD-10-CM | POA: Insufficient documentation

## 2022-05-14 DIAGNOSIS — I10 Essential (primary) hypertension: Secondary | ICD-10-CM | POA: Insufficient documentation

## 2022-05-14 MED ORDER — VINCRISTINE SULFATE CHEMO INJECTION 1 MG/ML
2.0000 mg | Freq: Once | INTRAVENOUS | Status: AC
  Administered 2022-05-14: 2 mg via INTRAVENOUS
  Filled 2022-05-14: qty 2

## 2022-05-14 MED ORDER — SODIUM CHLORIDE 0.9 % IV SOLN
375.0000 mg/m2 | Freq: Once | INTRAVENOUS | Status: AC
  Administered 2022-05-14: 1000 mg via INTRAVENOUS
  Filled 2022-05-14: qty 100

## 2022-05-14 MED ORDER — SODIUM CHLORIDE 0.9 % IV SOLN
750.0000 mg/m2 | Freq: Once | INTRAVENOUS | Status: AC
  Administered 2022-05-14: 2000 mg via INTRAVENOUS
  Filled 2022-05-14: qty 100

## 2022-05-14 MED ORDER — SODIUM CHLORIDE 0.9 % IV SOLN
150.0000 mg | Freq: Once | INTRAVENOUS | Status: AC
  Administered 2022-05-14: 150 mg via INTRAVENOUS
  Filled 2022-05-14: qty 5
  Filled 2022-05-14: qty 150

## 2022-05-14 MED ORDER — SODIUM CHLORIDE 0.9% FLUSH
10.0000 mL | INTRAVENOUS | Status: DC | PRN
  Administered 2022-05-14: 10 mL

## 2022-05-14 MED ORDER — HEPARIN SOD (PORK) LOCK FLUSH 100 UNIT/ML IV SOLN
500.0000 [IU] | Freq: Once | INTRAVENOUS | Status: AC | PRN
  Administered 2022-05-14: 500 [IU]

## 2022-05-14 MED ORDER — PALONOSETRON HCL INJECTION 0.25 MG/5ML
0.2500 mg | Freq: Once | INTRAVENOUS | Status: AC
  Administered 2022-05-14: 0.25 mg via INTRAVENOUS
  Filled 2022-05-14: qty 5

## 2022-05-14 MED ORDER — SODIUM CHLORIDE 0.9 % IV SOLN: Freq: Once | INTRAVENOUS | Status: AC

## 2022-05-14 MED ORDER — SODIUM CHLORIDE 0.9 % IV SOLN
10.0000 mg | Freq: Once | INTRAVENOUS | Status: AC
  Administered 2022-05-14: 10 mg via INTRAVENOUS
  Filled 2022-05-14: qty 10
  Filled 2022-05-14: qty 1

## 2022-05-14 MED ORDER — DOXORUBICIN HCL CHEMO IV INJECTION 2 MG/ML
50.0000 mg/m2 | Freq: Once | INTRAVENOUS | Status: AC
  Administered 2022-05-14: 132 mg via INTRAVENOUS
  Filled 2022-05-14: qty 66

## 2022-05-14 MED ORDER — DIPHENHYDRAMINE HCL 25 MG PO CAPS
50.0000 mg | ORAL_CAPSULE | Freq: Once | ORAL | Status: AC
  Administered 2022-05-14: 50 mg via ORAL
  Filled 2022-05-14: qty 2

## 2022-05-14 MED ORDER — ACETAMINOPHEN 325 MG PO TABS
650.0000 mg | ORAL_TABLET | Freq: Once | ORAL | Status: AC
  Administered 2022-05-14: 650 mg via ORAL
  Filled 2022-05-14: qty 2

## 2022-05-14 NOTE — Progress Notes (Signed)
Hypersensitivity Reaction note  Date of event: 05/14/22 Time of event: 1240 Generic name of drug involved: Rituxan Name of provider notified of the hypersensitivity reaction: Milinda Cave, PA Was agent that likely caused hypersensitivity reaction added to Allergies List within EMR? yes Chain of events including reaction signs/symptoms, treatment administered, and outcome (e.g., drug resumed; drug discontinued; sent to Emergency Department; etc.) Pt in infusion for first time R-CHOP. Pt 67 min into Ritxuxan infusion when he c/o chills. "I feel like I am in [place]." Infusion stopped, fluids and additional emergency meds administered. Alex Cortez, Alex Cortez at chairside. Pt BP dropped slightly. Pt returned to baseline 23 minutes later. Restarted infusion at half previous rate. Pt able to complete infusion with no further incident. See MAR for details.  Alex Shields, RN 05/14/2022 3:05 PM

## 2022-05-14 NOTE — Progress Notes (Signed)
DATE:  05/14/22                                        X CHEMO/IMMUNOTHERAPY REACTION           MD: Lorenso Courier   AGENT/BLOOD PRODUCT RECEIVING TODAY:              R-CHOP   AGENT/BLOOD PRODUCT RECEIVING IMMEDIATELY PRIOR TO REACTION:          [id number]-pvvr   VS: BP:     140/73   P:       78       SPO2:       100% RA                BP:     152/72   P:       81       SPO2:       99% RA     REACTION(S):           chills, back pain, shortness of breath   PREMEDS:     benadryl 50 mg IV, aloxi 0.25 mg IV, tylenol 650 mg PO, decadron 10 mg IV   INTERVENTION: Pepcid 20 mg IV, solu-medrol 62.5 mg IV   Review of Systems  Review of Systems  Constitutional:  Positive for chills.  Respiratory:  Positive for shortness of breath.   Musculoskeletal:  Positive for back pain.  All other systems reviewed and are negative.    Physical Exam  Physical Exam Vitals reviewed.  Constitutional:      General: He is not in acute distress.    Appearance: He is obese. He is not ill-appearing, toxic-appearing or diaphoretic.  HENT:     Head: Normocephalic.     Right Ear: External ear normal.     Left Ear: External ear normal.     Nose: Nose normal.     Mouth/Throat:     Mouth: Mucous membranes are moist.     Pharynx: Oropharynx is clear.  Eyes:     General: No scleral icterus.    Conjunctiva/sclera: Conjunctivae normal.  Cardiovascular:     Rate and Rhythm: Normal rate and regular rhythm.     Pulses: Normal pulses.     Heart sounds: Normal heart sounds.  Pulmonary:     Effort: Pulmonary effort is normal. No respiratory distress.     Breath sounds: Normal breath sounds. No stridor. No wheezing, rhonchi or rales.  Chest:     Chest wall: No tenderness.  Abdominal:     Palpations: Abdomen is soft.     Tenderness: There is no abdominal tenderness.  Musculoskeletal:        General: Normal range of motion.     Cervical back: Normal range of motion.  Skin:    General: Skin is warm and dry.      Capillary Refill: Capillary refill takes less than 2 seconds.  Neurological:     Mental Status: He is alert. Mental status is at baseline.  Psychiatric:        Mood and Affect: Mood normal.     OUTCOME:  Patient with chills, back pain, and shortness of breath shortly into 3rd rate increase. He was given emergency medication as above and returned to baseline. Treatment was restarted at previous rate. He tolerated remainder of treatment. MD made aware.  I have spent a  total of 10 minutes minutes of face-to-face and non-face-to-face time, preparing to see the patient, obtaining and/or reviewing separately obtained history, performing a medically appropriate examination, counseling and educating the patient, ordering medications, referring and communicating with other health care professionals, documenting clinical information in the electronic health record.

## 2022-05-14 NOTE — Telephone Encounter (Signed)
Discussed with pt, understanding voiced. 

## 2022-05-14 NOTE — Patient Instructions (Signed)
[facility]  Discharge Instructions: Thank you for choosing [facility] to provide your oncology and hematology care.   If you have a lab appointment with the [facility], please go directly to the [facility] and check in at the registration area.   Wear comfortable clothing and clothing appropriate for easy access to any Portacath or PICC line.   We strive to give you quality time with your provider. You may need to reschedule your appointment if you arrive late (15 or more minutes).  Arriving late affects you and other patients whose appointments are after yours.  Also, if you miss three or more appointments without notifying the office, you may be dismissed from the clinic at the provider's discretion.      For prescription refill requests, have your pharmacy contact our office and allow 72 hours for refills to be completed.    Today you received the following chemotherapy and/or immunotherapy agents rituxan, cytoxan, adriamycin, vincristine      To help prevent nausea and vomiting after your treatment, we encourage you to take your nausea medication as directed.  BELOW ARE SYMPTOMS THAT SHOULD BE REPORTED IMMEDIATELY: *FEVER GREATER THAN 100.4 F (38 C) OR HIGHER *CHILLS OR SWEATING *NAUSEA AND VOMITING THAT IS NOT CONTROLLED WITH YOUR NAUSEA MEDICATION *UNUSUAL SHORTNESS OF BREATH *UNUSUAL BRUISING OR BLEEDING *URINARY PROBLEMS (pain or burning when urinating, or frequent urination) *BOWEL PROBLEMS (unusual diarrhea, constipation, pain near the anus) TENDERNESS IN MOUTH AND THROAT WITH OR WITHOUT PRESENCE OF ULCERS (sore throat, sores in mouth, or a toothache) UNUSUAL RASH, SWELLING OR PAIN  UNUSUAL VAGINAL DISCHARGE OR ITCHING   Items with * indicate a potential emergency and should be followed up as soon as possible or go to the Emergency Department if any problems should occur.  Please show the CHEMOTHERAPY ALERT CARD or  IMMUNOTHERAPY ALERT CARD at check-in to the Emergency Department and triage nurse.  Should you have questions after your visit or need to cancel or reschedule your appointment, please contact [facility]  Dept: [phone]  and follow the prompts.  Office hours are 8:00 a.m. to 4:30 p.m. Monday - Friday. Please note that voicemails left after 4:00 p.m. may not be returned until the following business day.  We are closed weekends and major holidays. You have access to a nurse at all times for urgent questions. Please call the main number to the clinic Dept: [phone] and follow the prompts.   For any non-urgent questions, you may also contact your provider using MyChart. We now offer e-Visits for anyone 20 and older to request care online for non-urgent symptoms. For details visit [email or web address].   Also download the MyChart app! Go to the app store, search "MyChart", open the app, select [facility], and log in with your MyChart username and password.

## 2022-05-14 NOTE — Telephone Encounter (Signed)
Pt called stating he's in the hospital, receiving infusion for cancer treatment. States he wanted to make you aware his BG is 260.

## 2022-05-17 ENCOUNTER — Inpatient Hospital Stay: Payer: Medicare Other

## 2022-05-18 ENCOUNTER — Other Ambulatory Visit: Payer: Self-pay

## 2022-05-18 ENCOUNTER — Inpatient Hospital Stay: Payer: Medicare Other

## 2022-05-18 VITALS — BP 139/70 | HR 88 | Temp 98.6°F | Resp 17

## 2022-05-18 DIAGNOSIS — E039 Hypothyroidism, unspecified: Secondary | ICD-10-CM | POA: Diagnosis not present

## 2022-05-18 DIAGNOSIS — Z5111 Encounter for antineoplastic chemotherapy: Secondary | ICD-10-CM | POA: Diagnosis not present

## 2022-05-18 DIAGNOSIS — D509 Iron deficiency anemia, unspecified: Secondary | ICD-10-CM | POA: Diagnosis not present

## 2022-05-18 DIAGNOSIS — C8338 Diffuse large B-cell lymphoma, lymph nodes of multiple sites: Secondary | ICD-10-CM

## 2022-05-18 DIAGNOSIS — Z5112 Encounter for antineoplastic immunotherapy: Secondary | ICD-10-CM | POA: Diagnosis not present

## 2022-05-18 DIAGNOSIS — C8335 Diffuse large B-cell lymphoma, lymph nodes of inguinal region and lower limb: Secondary | ICD-10-CM | POA: Diagnosis not present

## 2022-05-18 DIAGNOSIS — Z5189 Encounter for other specified aftercare: Secondary | ICD-10-CM | POA: Diagnosis not present

## 2022-05-18 MED ORDER — PEGFILGRASTIM-PBBK 6 MG/0.6ML ~~LOC~~ SOSY
6.0000 mg | PREFILLED_SYRINGE | Freq: Once | SUBCUTANEOUS | Status: AC
  Administered 2022-05-18: 6 mg via SUBCUTANEOUS
  Filled 2022-05-18: qty 0.6

## 2022-05-18 NOTE — Patient Instructions (Signed)

## 2022-05-21 DIAGNOSIS — I63419 Cerebral infarction due to embolism of unspecified middle cerebral artery: Secondary | ICD-10-CM | POA: Diagnosis not present

## 2022-05-21 DIAGNOSIS — E1165 Type 2 diabetes mellitus with hyperglycemia: Secondary | ICD-10-CM | POA: Diagnosis not present

## 2022-05-21 DIAGNOSIS — N1831 Chronic kidney disease, stage 3a: Secondary | ICD-10-CM | POA: Diagnosis not present

## 2022-05-24 ENCOUNTER — Ambulatory Visit: Payer: Self-pay | Admitting: *Deleted

## 2022-05-24 ENCOUNTER — Encounter: Payer: Self-pay | Admitting: *Deleted

## 2022-05-24 NOTE — Patient Outreach (Signed)
Care Coordination   Follow Up Visit Note   05/24/2022 Name: Alex Cortez MRN: [id number] DOB: 11-21-1950  Alex Cortez is a 72 y.o. year old male who sees Burdine, Virgina Evener, MD for primary care. I spoke with  Alex Cortez by phone today.  What matters to the patients health and wellness today?  Managing blood sugar, blood pressure, and cancer treatments    Goals Addressed               This Visit's Progress     Patient Stated     DM management (pt-stated)   On track     Care Coordination Goals: Patient will follow-up with PCP and/or endocrinologist every 3 months or as recommended Patient will take medication as prescribed and reach out to provider with any negative side effects Patient will continue to monitor and record blood sugar 3 times per day and as needed with continuous glucose monitor, and will call PCP or endocrinologist with any readings outside of recommended range Patient will take blood sugar log and meter to provider visits for review Patient will follow a modified carbohydrate diet and decrease simple carbohydrates and sugars Patient will increase activity level as tolerated with an ultimate goal of at least 150 minutes of exercise per week Patient will check feet daily for sores, wounds, calluses, etc and will notify provider of any abnormal findings Patient will have yearly eye exams to check for or monitor diabetic retinopathy Patient will reach out to [place] Coordinator [phone] with any care coordination or resource needs        Other     Lymphoma Management   On track     Care Coordination Goals: Patient will keep all scheduled medical and imaging appointments Patient will take medications as prescribed Patient will increase activity level as tolerated but will rest as needed Patient will eat a balanced diet to aid in general health, healing, and energy levels Patient will take medication for nausea as needed Patient will talk with  oncology team regarding any new or worsening symptoms Patient will remain socially engaged with friends/family Patient will reach out to [place] Coordinator with any care coordination or resource needs [phone]      Manage HTN   On track     Care Coordination Goals: Patient will take medications as directed and report any negative side effects to provider  Patient will use a pill box/organizer to help keep up with when to take medications Patient will monitor and record blood pressure 3 times a week and as needed and will call PCP or specialist with any readings outside of recommended range Patient will keep all recommended follow-up appointments with PCP and specialists (cardiology, nephrology, etc) Patient will take blood pressure log to PCP and specialty appointments for review Patient will follow a low sodium/DASH diet  Patient will reach out to [place] Coordinator [phone] with any care coordination or resource needs        COMPLETED: Manage Lymphadenopathy        Care Coordination Interventions: Evaluation of current treatment plan related to lymphadenopathy and patient's adherence to plan as established by provider Assessed social determinant of health barriers Assessed family/social support Reviewed recent lab, imaging reports, and office notes Discussed right lower leg edema and enlarged lymph nodes in abdomen Biopsy from 03/16/22 was abnormal Discussed plan for right inguinal excisional lymph node biopsy with Dr. Michaelle Birks of [facility] surgery. This is still pending scheduling.  Reviewed  cardiac clearance visit note from yesterday Verified that patient will have transportation to and from surgical appt and assistance at home afterwards Patient reports that he's not worried or stressed about the upcoming procedure or results Provided with [facility] telephone number and encouraged to reach out as needed        SDOH assessments and interventions completed:   Yes  SDOH Interventions Today    Flowsheet Row Most Recent Value  SDOH Interventions   Transportation Interventions Intervention Not Indicated  Physical Activity Interventions Other (Comments)  [increasing avitivity as tolerated. currently undergoing chemotherapy]        Care Coordination Interventions:  Yes, provided  Interventions Today    Flowsheet Row Most Recent Value  Chronic Disease   Chronic disease during today's visit Diabetes, Hypertension (HTN), Other  [Lymphoma]  General Interventions   General Interventions Discussed/Reviewed General Interventions Discussed, General Interventions Reviewed, Labs, Doctor Visits, Durable Medical Equipment (DME)  Labs Hgb A1c every 3 months  [A1C improved. Home blood sugar has been well managed. Has had some low readings in early morning but CGM alerted him and he corrected it with a snack. Understands that blood sugar levels may not be optimal during cancer treatment]  Doctor Visits Discussed/Reviewed Doctor Visits Discussed, Doctor Visits Reviewed, Specialist, PCP  [upcoming appt with PCP on 05/26/22. Reviewed and discussed visit with oncologist and chemo treatments. Reviewed and discussed visit with endocrinologist]  Durable Medical Equipment (DME) BP Cuff  [continuous blood sugar monitor]  PCP/Specialist Visits Compliance with follow-up visit  Exercise Interventions   Exercise Discussed/Reviewed Physical Activity  Physical Activity Discussed/Reviewed Physical Activity Discussed  Clayton Lefort as active as possible but does hae some fatigue]  Education Interventions   Education Provided Provided Education  Provided Verbal Education On Nutrition, Labs, Blood Sugar Monitoring, When to see the doctor, Mental Health/Coping with Illness, Medication  Labs Reviewed Hgb A1c  Nutrition Interventions   Nutrition Discussed/Reviewed Nutrition Discussed, Fluid intake, Nutrition Reviewed  [appetite is good as of now. was having nausea associated with chemo but  that has improved. Also has medication to help]      Follow up plan: Follow up call scheduled for 06/24/22    Encounter Outcome:  Pt. Visit Completed   Chong Sicilian, BSN, RN-BC RN Care Coordinator [place]: [phone] Main #: [phone]

## 2022-05-26 DIAGNOSIS — Z6837 Body mass index (BMI) 37.0-37.9, adult: Secondary | ICD-10-CM | POA: Diagnosis not present

## 2022-05-26 DIAGNOSIS — K1231 Oral mucositis (ulcerative) due to antineoplastic therapy: Secondary | ICD-10-CM | POA: Diagnosis not present

## 2022-05-26 DIAGNOSIS — C8330 Diffuse large B-cell lymphoma, unspecified site: Secondary | ICD-10-CM | POA: Diagnosis not present

## 2022-05-26 DIAGNOSIS — R030 Elevated blood-pressure reading, without diagnosis of hypertension: Secondary | ICD-10-CM | POA: Diagnosis not present

## 2022-05-26 DIAGNOSIS — K760 Fatty (change of) liver, not elsewhere classified: Secondary | ICD-10-CM | POA: Diagnosis not present

## 2022-05-26 DIAGNOSIS — E039 Hypothyroidism, unspecified: Secondary | ICD-10-CM | POA: Diagnosis not present

## 2022-05-26 DIAGNOSIS — E7849 Other hyperlipidemia: Secondary | ICD-10-CM | POA: Diagnosis not present

## 2022-05-26 DIAGNOSIS — E1122 Type 2 diabetes mellitus with diabetic chronic kidney disease: Secondary | ICD-10-CM | POA: Diagnosis not present

## 2022-05-26 DIAGNOSIS — Z955 Presence of coronary angioplasty implant and graft: Secondary | ICD-10-CM | POA: Diagnosis not present

## 2022-05-26 DIAGNOSIS — G4733 Obstructive sleep apnea (adult) (pediatric): Secondary | ICD-10-CM | POA: Diagnosis not present

## 2022-05-26 DIAGNOSIS — N1831 Chronic kidney disease, stage 3a: Secondary | ICD-10-CM | POA: Diagnosis not present

## 2022-05-27 ENCOUNTER — Ambulatory Visit: Payer: Medicare Other | Attending: Cardiology | Admitting: Cardiology

## 2022-05-27 VITALS — BP 120/56 | HR 84 | Ht 75.0 in | Wt 273.6 lb

## 2022-05-27 DIAGNOSIS — I10 Essential (primary) hypertension: Secondary | ICD-10-CM | POA: Insufficient documentation

## 2022-05-27 DIAGNOSIS — E782 Mixed hyperlipidemia: Secondary | ICD-10-CM | POA: Diagnosis not present

## 2022-05-27 DIAGNOSIS — I2510 Atherosclerotic heart disease of native coronary artery without angina pectoris: Secondary | ICD-10-CM | POA: Insufficient documentation

## 2022-05-27 NOTE — Progress Notes (Signed)
Clinical Summary Mr. Cowsert is a 72 y.o.male seen today for follow up of the following medical problems.      1. CAD - prior stent to LAD in June 2011 - cath 2013 with patent stent and vessels 02/2018 echo: LVEF 60-65%, grade I dd - 06/2019 nuclear stress: inferior scar with mod peri-infarct ischemia vs artifact  - 10/2020 echo LVEF by pcp at [facility] with normal LVEF   -04/2022 echo LVEF 55-60%, grade II dd - no chest pains, no SOB/DOE - compliant with meds   2. HTN - he is compliant with meds     3. History of CVA - has been on plavix   4. DM2   5. NSVT -no recent palpitations.    6. Tobacco history - no cough, no wheezing - wants to think about breathing tests - 2017 255 lbs - he thinks he had PFTs with pcp   7. Severe OSA - 01/19/2021 abnoral sleep study, severe OSA - has not gotten cpap machine, currently hesitant to get.    8. Hyperlipidemia - 12/2020 TC 133 TG 104 HDL 26 LDL 87 - 08/2021 TC 145 TG 113 HDL 35 LDL 89 - 02/2022 TC 102 TG 108 HDL 27 LDL 55    9.Lymphoma - recent diagnosis earlier this year - Diffuse Large B Cell Lymphoma, Stage III  - followed by oncology, currently on chemo   AAA screen: over 13, smoking history. AAA [place] was negative   Past Medical History:  Diagnosis Date   CAD (coronary artery disease)    nonobstructive CAD/patent LAD stent site; EF 65%, 08/2011; NSTEMI/DES LAD, 6/20 11   Chest pain    Diabetes mellitus    insulin-dependent   Dyslipidemia    HTN (hypertension)    Hypothyroidism    Myocardial infarction [facility]) 2011   Post MI syndrome ([place]) 08/29/2009   Stroke ([place]) 10/22/2012   Left inferior pons     Allergies  Allergen Reactions   Ace Inhibitors Swelling    Angioedema   Rituxan [Rituximab] Other (See Comments)    Pt had sensitivity reaction to Rituxan. See progress note from 05/14/22. Pt able to complete infusion.    Shrimp [Shellfish Allergy] Hives and Swelling     Current Outpatient Medications   Medication Sig Dispense Refill   allopurinol (ZYLOPRIM) 300 MG tablet Take 1 tablet (300 mg total) by mouth daily. 90 tablet 1   amLODipine (NORVASC) 10 MG tablet Take 10 mg by mouth daily. for high blood pressure     carvedilol (COREG) 25 MG tablet Take 1 tablet (25 mg total) by mouth 2 (two) times daily. 180 tablet 3   clopidogrel (PLAVIX) 75 MG tablet Take 1 tablet (75 mg total) by mouth daily. 30 tablet 5   Continuous Blood Gluc Receiver (FREESTYLE LIBRE 2 READER) DEVI As directed 1 each 0   Continuous Blood Gluc Sensor (FREESTYLE LIBRE 2 SENSOR) MISC APPLY 1 SENSOR EVERY 14    DAYS 6 each 0   ezetimibe (ZETIA) 10 MG tablet TAKE 1 TABLET BY MOUTH EVERY DAY 90 tablet 2   FARXIGA 5 MG TABS tablet TAKE 1 TABLET BY MOUTH EVERY DAY BEFORE BREAKFAST 30 tablet 2   fluticasone (FLONASE) 50 MCG/ACT nasal spray Place 2 sprays into both nostrils daily as needed for allergies.     insulin regular human CONCENTRATED (HUMULIN R U-500 KWIKPEN) 500 UNIT/ML KwikPen INJECT AS DIRECTED 70 UNITS WITH BREAKFAST, 70 UNITS WITH LUNCH, AND 70 UNITS WITH  SUPPER FOR PRE-MEAL BLOOD GLUCOSE READINGS OF '90MG'$ /DL OR ABOVE 45 mL 0   isosorbide mononitrate (IMDUR) 30 MG 24 hr tablet TAKE 1 TABLET BY MOUTH EVERY DAY 90 tablet 1   levothyroxine (SYNTHROID) 75 MCG tablet TAKE 1 TABLET BY MOUTH EVERY DAY BEFORE BREAKFAST 90 tablet 0   lidocaine-prilocaine (EMLA) cream Apply 1 Application topically as needed. 30 g 0   losartan (COZAAR) 50 MG tablet Take 1 tablet (50 mg total) by mouth daily.     metFORMIN (GLUCOPHAGE) 500 MG tablet Take 1 tablet (500 mg total) by mouth daily with breakfast. 90 tablet 1   nitroGLYCERIN (NITROSTAT) 0.4 MG SL tablet Place 1 tablet (0.4 mg total) under the tongue every 5 (five) minutes as needed. 25 tablet 3   ondansetron (ZOFRAN) 8 MG tablet Take 1 tablet (8 mg total) by mouth every 8 (eight) hours as needed. 30 tablet 0   ONE TOUCH ULTRA TEST test strip USE TO TEST BLOOD SUGAR FOUR TIMES DAILY  E11.65 150 each 5   predniSONE (DELTASONE) 20 MG tablet Take 3 tablets (60 mg total) by mouth daily with breakfast. Take on Day 1 of chemotherapy and continue until Day 5. Repeat with each chemotherapy treatment. 15 tablet 5   prochlorperazine (COMPAZINE) 10 MG tablet Take 1 tablet (10 mg total) by mouth every 6 (six) hours as needed for nausea or vomiting. 30 tablet 0   rosuvastatin (CRESTOR) 40 MG tablet TAKE 1 TABLET BY MOUTH EVERY DAY 90 tablet 2   No current facility-administered medications for this visit.     Past Surgical History:  Procedure Laterality Date   COLONOSCOPY  08/2021   CORONARY ANGIOPLASTY WITH STENT PLACEMENT  2011   IR IMAGING GUIDED PORT INSERTION  04/26/2022   LYMPH NODE BIOPSY Right 04/14/2022   Procedure: RIGHT INGUINAL EXCISIONAL LYMPH NODE BIOPSY;  Surgeon: Dwan Bolt, MD;  Location: [facility];  Service: General;  Laterality: Right;     Allergies  Allergen Reactions   Ace Inhibitors Swelling    Angioedema   Rituxan [Rituximab] Other (See Comments)    Pt had sensitivity reaction to Rituxan. See progress note from 05/14/22. Pt able to complete infusion.    Shrimp [Shellfish Allergy] Hives and Swelling      Family History  Problem Relation Age of Onset   Colon cancer Mother    Hypertension Other        in all family members     Social History Mr. Oleksy reports that he quit smoking about 34 years ago. His smoking use included cigarettes. He started smoking about 67 years ago. He has a 11.50 pack-year smoking history. He has never used smokeless tobacco. Mr. Tapp reports no history of alcohol use.   Review of Systems CONSTITUTIONAL: No weight loss, fever, chills, weakness or fatigue.  HEENT: Eyes: No visual loss, blurred vision, double vision or yellow sclerae.No hearing loss, sneezing, congestion, runny nose or sore throat.  SKIN: No rash or itching.  CARDIOVASCULAR: per hpi RESPIRATORY: No shortness of breath, cough or sputum.   GASTROINTESTINAL: No anorexia, nausea, vomiting or diarrhea. No abdominal pain or blood.  GENITOURINARY: No burning on urination, no polyuria NEUROLOGICAL: No headache, dizziness, syncope, paralysis, ataxia, numbness or tingling in the extremities. No change in bowel or bladder control.  MUSCULOSKELETAL: No muscle, back pain, joint pain or stiffness.  LYMPHATICS: No enlarged nodes. No history of splenectomy.  PSYCHIATRIC: No history of depression or anxiety.  ENDOCRINOLOGIC: No reports of sweating, cold or  heat intolerance. No polyuria or polydipsia.  Marland Kitchen   Physical Examination Today's Vitals   05/27/22 0901  BP: (!) 120/56  Pulse: 84  SpO2: 97%  Weight: 273 lb 9.6 oz (124.1 kg)  Height: '6\' 3"'$  (1.905 m)   Body mass index is 34.2 kg/m.  Gen: resting comfortably, no acute distress HEENT: no scleral icterus, pupils equal round and reactive, no palptable cervical adenopathy,  CV: RRR, no mrg, no jvd Resp: Clear to auscultation bilaterally GI: abdomen is soft, non-tender, non-distended, normal bowel sounds, no hepatosplenomegaly MSK: extremities are warm, no edema.  Skin: warm, no rash Neuro:  no focal deficits Psych: appropriate affect   Diagnostic Studies  Nuclear stress test 02/15/2018:   No diagnostic ST segment changes to indicate ischemia. Small, mild intensity, apical to basal inferior/inferolateral defect that is partially reversible. This is consistent with a mild ischemic territory versus variable diaphragmatic attenuation. This is an intermediate risk study. Nuclear stress EF: 44%. Visually, LVEF appears preserved, suggest echocardiogram to confirm.   Echocardiogram 02/20/2018 (limited):   Left ventricle: The cavity size was normal. There was moderate   concentric hypertrophy. Systolic function was normal. The   estimated ejection fraction was in the range of 60% to 65%. LVEF   64.8% by GLS. Wall motion was normal; there were no regional wall   motion  abnormalities. Doppler parameters are consistent with   abnormal left ventricular relaxation (grade 1 diastolic   dysfunction). Doppler parameters are consistent with high   ventricular filling pressure. - Aortic valve: Trileaflet; mildly thickened leaflets. - Mitral valve: Mild focal calcification of tip of anterior   leaflet. Mildly calcified annulus.   Cardiac catheterization 09/03/2011:   Hemodynamics:    RA: 13/11/9 RV: 34/7/9 PCWP: 11/9/8 PA:  28/13/19   Cardiac Output              Thermodilution: 6.0 with index of 2.5             Fick : 5.6 with index of 2.3   Arterial Sat: 94 PA Sat: 65.   LV pressure: 158/19/17 Aortic pressure: 153/90/115   Angiography    Left Main: The left main has minor luminal regularities but no significant stenosis.   Left anterior Descending: The LAD has diffuse mild right allergies. There is a stent in the mid LAD which is widely patent. The distal LAD has minor luminal irregularities. There is a 40-50% stenosis in the very distal aspect of the LAD.  The first diagonal Chanel Mckesson is a fairly large Naethan Bracewell. There are minor little regular disease but no significant stenosis. The second diagonal artery is a moderate-sized vessel. There is a 50% focal stenosis.   Left Circumflex: The left circumflex artery is a moderate-sized vessel. There are minor luminal irregularities   Right Coronary Artery: The right coronary artery is large and dominant. There are diffuse irregularities throughout the RCA. There is a 40% stenosis in the distal RCA. This is followed by  a focal 50% stenosis just prior to the bifurcation of 2 posterior lateral branches. This lesion does not appear to obstruct flow.   LV Gram: The left ventricular function is normal. Ejection fraction is roughly 65%.   Complications: No apparent complications Patient did tolerate procedure well.   Conclusions:   1. Mild to moderate coronary artery disease. The stent in the LAD is widely patent.  There are no significant irregularities to explain his symptoms. 2. Normal left systolic function. Essentially normal right heart pressures. 3. Moderate to  severe hypertension. I suspect that this is the cause of the shortness of breath. He will need better blood pressure control 4. Poorly controlled diabetes. His glucose here in the lab was 329. He's on oral hypoglycemics. He will likely need to be started on insulin. We will defer to his medical Dr.     10/2020 echo Summary    1. The left ventricle is normal in size with upper normal wall thickness.    2. The left ventricular systolic function is normal, LVEF is visually  estimated at > 55%.    3. The left atrium is mildly dilated in size.    4. The right ventricle is normal in size, with normal systolic function.    5. Mild aortic valve sclerosis without stenosis.      02/2021 AAA [place] screen Summary:  Abdominal Aorta: No evidence of an abdominal aortic aneurysm was  visualized. The largest aortic measurement is 2.1 cm.  Stenosis:  Poor quality study. No obvious abnormality of the mid to distal abdominal  aorta.    04/2022 echo  1. Global longitudinal strain is -16.4% (Borderline).. Left ventricular  ejection fraction, by estimation, is 55 to 60%. The left ventricle has  normal function. The left ventricle has no regional wall motion  abnormalities. Left ventricular diastolic  parameters are consistent with Grade II diastolic dysfunction  (pseudonormalization). The average left ventricular global longitudinal  strain is -16.4 %. The global longitudinal strain is normal.   2. Right ventricular systolic function is normal. The right ventricular  size is normal.   3. The mitral valve is grossly normal. No evidence of mitral valve  regurgitation. No evidence of mitral stenosis.   4. The aortic valve is tricuspid. There is mild calcification of the  aortic valve. Aortic valve regurgitation is not visualized. Aortic valve   sclerosis/calcification is present, without any evidence of aortic  stenosis.   5. The inferior vena cava is normal in size with greater than 50%  respiratory variability, suggesting right atrial pressure of 3 mmHg.   Assessment and Plan   1. CAD - he is on plavix instead of ASA due to prior CVA - no symptoms,continue current meds   2. HTN -at goal, continue currnent meds   3. Hyperlipidemia -LDL at goal, continue current meds   F/u 6 months  Arnoldo Lenis, M.D.

## 2022-05-27 NOTE — Patient Instructions (Signed)
Medication Instructions:  Continue all current medications.   Labwork: none  Testing/Procedures: none  Follow-Up: 6 months   Any Other Special Instructions Will Be Listed Below (If Applicable).   If you need a refill on your cardiac medications before your next appointment, please call your pharmacy.  

## 2022-06-03 MED FILL — Dexamethasone Sodium Phosphate Inj 100 MG/10ML: INTRAMUSCULAR | Qty: 1 | Status: AC

## 2022-06-03 MED FILL — Fosaprepitant Dimeglumine For IV Infusion 150 MG (Base Eq): INTRAVENOUS | Qty: 5 | Status: AC

## 2022-06-04 ENCOUNTER — Inpatient Hospital Stay: Payer: Medicare Other

## 2022-06-04 ENCOUNTER — Other Ambulatory Visit: Payer: Self-pay

## 2022-06-04 ENCOUNTER — Inpatient Hospital Stay (HOSPITAL_BASED_OUTPATIENT_CLINIC_OR_DEPARTMENT_OTHER): Payer: Medicare Other | Admitting: Hematology and Oncology

## 2022-06-04 VITALS — BP 113/63 | HR 75 | Temp 97.7°F | Resp 18

## 2022-06-04 VITALS — BP 97/55 | HR 73 | Temp 98.2°F | Resp 16 | Wt 280.4 lb

## 2022-06-04 DIAGNOSIS — Z5112 Encounter for antineoplastic immunotherapy: Secondary | ICD-10-CM | POA: Diagnosis not present

## 2022-06-04 DIAGNOSIS — C8338 Diffuse large B-cell lymphoma, lymph nodes of multiple sites: Secondary | ICD-10-CM

## 2022-06-04 DIAGNOSIS — Z5111 Encounter for antineoplastic chemotherapy: Secondary | ICD-10-CM | POA: Diagnosis not present

## 2022-06-04 DIAGNOSIS — E039 Hypothyroidism, unspecified: Secondary | ICD-10-CM | POA: Diagnosis not present

## 2022-06-04 DIAGNOSIS — C8335 Diffuse large B-cell lymphoma, lymph nodes of inguinal region and lower limb: Secondary | ICD-10-CM | POA: Diagnosis not present

## 2022-06-04 DIAGNOSIS — Z5189 Encounter for other specified aftercare: Secondary | ICD-10-CM | POA: Diagnosis not present

## 2022-06-04 DIAGNOSIS — D509 Iron deficiency anemia, unspecified: Secondary | ICD-10-CM | POA: Diagnosis not present

## 2022-06-04 DIAGNOSIS — Z95828 Presence of other vascular implants and grafts: Secondary | ICD-10-CM

## 2022-06-04 LAB — CBC WITH DIFFERENTIAL (CANCER CENTER ONLY)
Abs Immature Granulocytes: 0.04 10*3/uL (ref 0.00–0.07)
Basophils Absolute: 0.2 10*3/uL — ABNORMAL HIGH (ref 0.0–0.1)
Basophils Relative: 3 %
Eosinophils Absolute: 0.2 10*3/uL (ref 0.0–0.5)
Eosinophils Relative: 4 %
HCT: 34.5 % — ABNORMAL LOW (ref 39.0–52.0)
Hemoglobin: 11 g/dL — ABNORMAL LOW (ref 13.0–17.0)
Immature Granulocytes: 1 %
Lymphocytes Relative: 23 %
Lymphs Abs: 1.4 10*3/uL (ref 0.7–4.0)
MCH: 25.4 pg — ABNORMAL LOW (ref 26.0–34.0)
MCHC: 31.9 g/dL (ref 30.0–36.0)
MCV: 79.7 fL — ABNORMAL LOW (ref 80.0–100.0)
Monocytes Absolute: 0.6 10*3/uL (ref 0.1–1.0)
Monocytes Relative: 10 %
Neutro Abs: 3.6 10*3/uL (ref 1.7–7.7)
Neutrophils Relative %: 59 %
Platelet Count: 251 10*3/uL (ref 150–400)
RBC: 4.33 MIL/uL (ref 4.22–5.81)
RDW: 15.9 % — ABNORMAL HIGH (ref 11.5–15.5)
WBC Count: 6.1 10*3/uL (ref 4.0–10.5)
nRBC: 0.3 % — ABNORMAL HIGH (ref 0.0–0.2)

## 2022-06-04 LAB — CMP (CANCER CENTER ONLY)
ALT: 28 U/L (ref 0–44)
AST: 16 U/L (ref 15–41)
Albumin: 3.7 g/dL (ref 3.5–5.0)
Alkaline Phosphatase: 68 U/L (ref 38–126)
Anion gap: 6 (ref 5–15)
BUN: 15 mg/dL (ref 8–23)
CO2: 29 mmol/L (ref 22–32)
Calcium: 9 mg/dL (ref 8.9–10.3)
Chloride: 105 mmol/L (ref 98–111)
Creatinine: 1.13 mg/dL (ref 0.61–1.24)
GFR, Estimated: >60 mL/min (ref >60)
Glucose, Bld: 161 mg/dL — ABNORMAL HIGH (ref 70–99)
Potassium: 4 mmol/L (ref 3.5–5.1)
Sodium: 140 mmol/L (ref 135–145)
Total Bilirubin: 0.5 mg/dL (ref 0.3–1.2)
Total Protein: 6.6 g/dL (ref 6.5–8.1)

## 2022-06-04 LAB — URIC ACID: Uric Acid, Serum: 3.3 mg/dL — ABNORMAL LOW (ref 3.7–8.6)

## 2022-06-04 LAB — LACTATE DEHYDROGENASE: LDH: 231 U/L — ABNORMAL HIGH (ref 98–192)

## 2022-06-04 MED ORDER — SODIUM CHLORIDE 0.9 % IV SOLN: Freq: Once | INTRAVENOUS | Status: AC

## 2022-06-04 MED ORDER — FAMOTIDINE IN NACL 20-0.9 MG/50ML-% IV SOLN
20.0000 mg | Freq: Once | INTRAVENOUS | Status: AC
  Administered 2022-06-04: 20 mg via INTRAVENOUS
  Filled 2022-06-04: qty 50

## 2022-06-04 MED ORDER — DOXORUBICIN HCL CHEMO IV INJECTION 2 MG/ML
50.0000 mg/m2 | Freq: Once | INTRAVENOUS | Status: AC
  Administered 2022-06-04: 132 mg via INTRAVENOUS
  Filled 2022-06-04: qty 66

## 2022-06-04 MED ORDER — SODIUM CHLORIDE 0.9% FLUSH
10.0000 mL | Freq: Once | INTRAVENOUS | Status: AC
  Administered 2022-06-04: 10 mL

## 2022-06-04 MED ORDER — ACETAMINOPHEN 325 MG PO TABS
650.0000 mg | ORAL_TABLET | Freq: Once | ORAL | Status: AC
  Administered 2022-06-04: 650 mg via ORAL
  Filled 2022-06-04: qty 2

## 2022-06-04 MED ORDER — VINCRISTINE SULFATE CHEMO INJECTION 1 MG/ML
2.0000 mg | Freq: Once | INTRAVENOUS | Status: AC
  Administered 2022-06-04: 2 mg via INTRAVENOUS
  Filled 2022-06-04: qty 2

## 2022-06-04 MED ORDER — DIPHENHYDRAMINE HCL 25 MG PO CAPS
50.0000 mg | ORAL_CAPSULE | Freq: Once | ORAL | Status: AC
  Administered 2022-06-04: 50 mg via ORAL
  Filled 2022-06-04: qty 2

## 2022-06-04 MED ORDER — SODIUM CHLORIDE 0.9% FLUSH
10.0000 mL | INTRAVENOUS | Status: DC | PRN
  Administered 2022-06-04: 10 mL

## 2022-06-04 MED ORDER — SODIUM CHLORIDE 0.9 % IV SOLN
375.0000 mg/m2 | Freq: Once | INTRAVENOUS | Status: AC
  Administered 2022-06-04: 1000 mg via INTRAVENOUS
  Filled 2022-06-04: qty 100

## 2022-06-04 MED ORDER — HEPARIN SOD (PORK) LOCK FLUSH 100 UNIT/ML IV SOLN
500.0000 [IU] | Freq: Once | INTRAVENOUS | Status: AC | PRN
  Administered 2022-06-04: 500 [IU]

## 2022-06-04 MED ORDER — SODIUM CHLORIDE 0.9 % IV SOLN
10.0000 mg | Freq: Once | INTRAVENOUS | Status: AC
  Administered 2022-06-04: 10 mg via INTRAVENOUS
  Filled 2022-06-04: qty 10

## 2022-06-04 MED ORDER — SODIUM CHLORIDE 0.9 % IV SOLN
150.0000 mg | Freq: Once | INTRAVENOUS | Status: AC
  Administered 2022-06-04: 150 mg via INTRAVENOUS
  Filled 2022-06-04: qty 150

## 2022-06-04 MED ORDER — MONTELUKAST SODIUM 10 MG PO TABS
10.0000 mg | ORAL_TABLET | Freq: Once | ORAL | Status: AC
  Administered 2022-06-04: 10 mg via ORAL
  Filled 2022-06-04: qty 1

## 2022-06-04 MED ORDER — SODIUM CHLORIDE 0.9 % IV SOLN
750.0000 mg/m2 | Freq: Once | INTRAVENOUS | Status: AC
  Administered 2022-06-04: 2000 mg via INTRAVENOUS
  Filled 2022-06-04: qty 100

## 2022-06-04 MED ORDER — PALONOSETRON HCL INJECTION 0.25 MG/5ML
0.2500 mg | Freq: Once | INTRAVENOUS | Status: AC
  Administered 2022-06-04: 0.25 mg via INTRAVENOUS
  Filled 2022-06-04: qty 5

## 2022-06-04 NOTE — Progress Notes (Signed)
[place] [phone]   [phone]  PROGRESS NOTE  Patient Care Team: Curlene Labrum, MD as PCP - General Branch, Alphonse Guild, MD as PCP - Cardiology (Cardiology) Cassandria Anger, MD as Consulting Physician (Endocrinology) Ilean China, RN as [place] History # Diffuse Large B Cell Lymphoma, Stage III 03/16/2022: establish care with Dr. Lorenso Courier in [facility] 04/14/2022: excisional lymph node biopsy of right inguinal node shows DLBCL arising from follicular lymphoma 0000000: PET CT scan showed widespread hypermetabolic lymphadenopathy in the neck, chest, abdomen, and pelvis 05/06/2021: Defer Cycle 1 Day 1 of R-CHOP chemotherapy. Patient has wound dehiscence and possible infection.  Seen by surgery same day. 05/14/2022: Cycle 1 Day 1 of R-CHOP chemotherapy 06/03/2022: Cycle 2 Day 1 of R-CHOP chemotherapy  Interval History:  Alex Cortez 72 y.o. male with medical history significant for newly diagnosed diffuse large B-cell lymphoma who presents for a follow up visit. The patient's last visit was on 05/14/2022. In the interim since the last visit he completed Cycle 1 of chemotherapy.   On exam today Alex Cortez is accompanied by his wife.  He reports that his hair is thinning and he did have some nausea with his first cycle.  He reports that he took his nausea medications which prevented any vomiting.  He notes other than that he tolerated the treatment quite well.  He notes that the incision site on his right thigh is not red or painful though it does drain "a little".  He reports that his appetite has been "okay" though he has developed some sores in his mouth with chemotherapy.  He is not having any diarrhea or constipation.  He also denies any lightheadedness, dizziness, or shortness of breath.  Overall he feels quite well and is willing and able to proceed with treatment at this  time.  He denies fevers, chills, sweats, shortness of breath, chest pain or cough. He has no other complaints. Rest of the 10 point ROS is below.    MEDICAL HISTORY:  Past Medical History:  Diagnosis Date   CAD (coronary artery disease)    nonobstructive CAD/patent LAD stent site; EF 65%, 08/2011; NSTEMI/DES LAD, 6/20 11   Chest pain    Diabetes mellitus    insulin-dependent   Dyslipidemia    HTN (hypertension)    Hypothyroidism    Myocardial infarction ([place]) 2011   Post MI syndrome ([place]) 08/29/2009   Stroke ([place]) 10/22/2012   Left inferior pons    SURGICAL HISTORY: Past Surgical History:  Procedure Laterality Date   COLONOSCOPY  08/2021   CORONARY ANGIOPLASTY WITH STENT PLACEMENT  2011   IR IMAGING GUIDED PORT INSERTION  04/26/2022   LYMPH NODE BIOPSY Right 04/14/2022   Procedure: RIGHT INGUINAL EXCISIONAL LYMPH NODE BIOPSY;  Surgeon: Dwan Bolt, MD;  Location: [facility];  Service: General;  Laterality: Right;    SOCIAL HISTORY: Social History   Socioeconomic History   Marital status: Married    Spouse name: Not on file   Number of children: Not on file   Years of education: Not on file   Highest education level: Not on file  Occupational History   Not on file  Tobacco Use   Smoking status: Former    Packs/day: 0.50    Years: 23.00    Additional pack years: 0.00    Total pack years: 11.50    Types: Cigarettes    Start date: 03/16/1955  Quit date: 03/15/1988    Years since quitting: 34.2   Smokeless tobacco: Never  Vaping Use   Vaping Use: Never used  Substance and Sexual Activity   Alcohol use: No    Alcohol/week: 0.0 standard drinks of alcohol   Drug use: No   Sexual activity: Not on file  Other Topics Concern   Not on file  Social History Narrative   Works at [employer]. Married.    Social Determinants of Health   Financial Resource Strain: Low Risk  (03/17/2022)   Overall Financial Resource Strain (CARDIA)    Difficulty of Paying Living Expenses: Not  hard at all  Food Insecurity: Unknown (12/01/2021)   Hunger Vital Sign    Worried About Running Out of Food in the Last Year: Not on file    Ran Out of Food in the Last Year: Never true  Transportation Needs: No Transportation Needs (05/24/2022)   PRAPARE - Hydrologist (Medical): No    Lack of Transportation (Non-Medical): No  Physical Activity: Inactive (05/24/2022)   Exercise Vital Sign    Days of Exercise per Week: 0 days    Minutes of Exercise per Session: 0 min  Stress: No Stress Concern Present (04/07/2022)   [place]    Feeling of Stress : Not at all  Social Connections: Unknown (07/31/2017)   Social Connection and Isolation Panel [NHANES]    Frequency of Communication with Friends and Family: Patient declined    Frequency of Social Gatherings with Friends and Family: Patient declined    Attends Religious Services: Patient declined    Marine scientist or Organizations: Patient declined    Attends Archivist Meetings: Patient declined    Marital Status: Patient declined  Intimate Partner Violence: Unknown (07/31/2017)   Humiliation, Afraid, Rape, and Kick questionnaire    Fear of Current or Ex-Partner: Patient declined    Emotionally Abused: Patient declined    Physically Abused: Patient declined    Sexually Abused: Patient declined    FAMILY HISTORY: Family History  Problem Relation Age of Onset   Colon cancer Mother    Hypertension Other        in all family members    ALLERGIES:  is allergic to ace inhibitors, rituxan [rituximab], and shrimp [shellfish allergy].  MEDICATIONS:  Current Outpatient Medications  Medication Sig Dispense Refill   allopurinol (ZYLOPRIM) 300 MG tablet Take 1 tablet (300 mg total) by mouth daily. 90 tablet 1   amLODipine (NORVASC) 10 MG tablet Take 10 mg by mouth daily. for high blood pressure     carvedilol (COREG) 25 MG tablet  Take 1 tablet (25 mg total) by mouth 2 (two) times daily. 180 tablet 3   clopidogrel (PLAVIX) 75 MG tablet Take 1 tablet (75 mg total) by mouth daily. 30 tablet 5   Continuous Blood Gluc Receiver (FREESTYLE LIBRE 2 READER) DEVI As directed 1 each 0   Continuous Blood Gluc Sensor (FREESTYLE LIBRE 2 SENSOR) MISC APPLY 1 SENSOR EVERY 14    DAYS 6 each 0   ezetimibe (ZETIA) 10 MG tablet TAKE 1 TABLET BY MOUTH EVERY DAY 90 tablet 2   FARXIGA 5 MG TABS tablet TAKE 1 TABLET BY MOUTH EVERY DAY BEFORE BREAKFAST 30 tablet 2   fluticasone (FLONASE) 50 MCG/ACT nasal spray Place 2 sprays into both nostrils daily as needed for allergies.     insulin regular human CONCENTRATED (HUMULIN R  U-500 KWIKPEN) 500 UNIT/ML KwikPen INJECT AS DIRECTED 70 UNITS WITH BREAKFAST, 70 UNITS WITH LUNCH, AND 70 UNITS WITH SUPPER FOR PRE-MEAL BLOOD GLUCOSE READINGS OF 90MG /DL OR ABOVE 45 mL 0   isosorbide mononitrate (IMDUR) 30 MG 24 hr tablet TAKE 1 TABLET BY MOUTH EVERY DAY 90 tablet 1   levothyroxine (SYNTHROID) 75 MCG tablet TAKE 1 TABLET BY MOUTH EVERY DAY BEFORE BREAKFAST 90 tablet 0   lidocaine-prilocaine (EMLA) cream Apply 1 Application topically as needed. 30 g 0   losartan (COZAAR) 50 MG tablet Take 1 tablet (50 mg total) by mouth daily.     metFORMIN (GLUCOPHAGE) 500 MG tablet Take 1 tablet (500 mg total) by mouth daily with breakfast. 90 tablet 1   nitroGLYCERIN (NITROSTAT) 0.4 MG SL tablet Place 1 tablet (0.4 mg total) under the tongue every 5 (five) minutes as needed. 25 tablet 3   ondansetron (ZOFRAN) 8 MG tablet Take 1 tablet (8 mg total) by mouth every 8 (eight) hours as needed. 30 tablet 0   ONE TOUCH ULTRA TEST test strip USE TO TEST BLOOD SUGAR FOUR TIMES DAILY E11.65 150 each 5   predniSONE (DELTASONE) 20 MG tablet Take 3 tablets (60 mg total) by mouth daily with breakfast. Take on Day 1 of chemotherapy and continue until Day 5. Repeat with each chemotherapy treatment. 15 tablet 5   prochlorperazine (COMPAZINE)  10 MG tablet Take 1 tablet (10 mg total) by mouth every 6 (six) hours as needed for nausea or vomiting. 30 tablet 0   rosuvastatin (CRESTOR) 40 MG tablet TAKE 1 TABLET BY MOUTH EVERY DAY 90 tablet 2   No current facility-administered medications for this visit.    REVIEW OF SYSTEMS:   Constitutional: ( - ) fevers, ( - )  chills , ( - ) night sweats Eyes: ( - ) blurriness of vision, ( - ) double vision, ( - ) watery eyes Ears, nose, mouth, throat, and face: ( - ) mucositis, ( - ) sore throat Respiratory: ( - ) cough, ( - ) dyspnea, ( - ) wheezes Cardiovascular: ( - ) palpitation, ( - ) chest discomfort, ( - ) lower extremity swelling Gastrointestinal:  ( - ) nausea, ( - ) heartburn, ( - ) change in bowel habits Skin: ( - ) abnormal skin rashes Lymphatics: ( - ) new lymphadenopathy, ( - ) easy bruising Neurological: ( - ) numbness, ( - ) tingling, ( - ) new weaknesses Behavioral/Psych: ( - ) mood change, ( - ) new changes  All other systems were reviewed with the patient and are negative.  PHYSICAL EXAMINATION: ECOG PERFORMANCE STATUS: 0 - Asymptomatic  Vitals:   06/04/22 0932  BP: (!) 97/55  Pulse: 73  Resp: 16  Temp: 98.2 F (36.8 C)  SpO2: 98%    Filed Weights   06/04/22 0932  Weight: 280 lb 6.4 oz (127.2 kg)     GENERAL: Well-appearing elderly African-American male alert, no distress and comfortable SKIN: skin color, texture, turgor are normal, no rashes or significant lesions. Inguinal wound is healing, no discharged noted.  EYES: conjunctiva are pink and non-injected, sclera clear NECK: supple, non-tender LYMPH:   palpable lymphadenopathy in the cervical and axillary nodes.  LUNGS: clear to auscultation and percussion with normal breathing effort HEART: regular rate & rhythm and no murmurs and no lower extremity edema Musculoskeletal: no cyanosis of digits and no clubbing  PSYCH: alert & oriented x 3, fluent speech NEURO: no focal motor/sensory  deficits  LABORATORY  DATA:  I have reviewed the data as listed    Latest Ref Rng & Units 06/04/2022    8:49 AM 05/12/2022   12:25 PM 05/05/2022    9:17 AM  CBC  WBC 4.0 - 10.5 K/uL 6.1  4.6  4.1   Hemoglobin 13.0 - 17.0 g/dL 11.0  11.5  12.1   Hematocrit 39.0 - 52.0 % 34.5  35.2  37.0   Platelets 150 - 400 K/uL 251  192  197        Latest Ref Rng & Units 06/04/2022    8:49 AM 05/12/2022   12:25 PM 05/05/2022    9:17 AM  CMP  Glucose 70 - 99 mg/dL 161  108  174   BUN 8 - 23 mg/dL 15  21  17    Creatinine 0.61 - 1.24 mg/dL 1.13  1.38  1.12   Sodium 135 - 145 mmol/L 140  137  140   Potassium 3.5 - 5.1 mmol/L 4.0  4.6  4.3   Chloride 98 - 111 mmol/L 105  107  108   CO2 22 - 32 mmol/L 29  25  26    Calcium 8.9 - 10.3 mg/dL 9.0  8.6  8.4   Total Protein 6.5 - 8.1 g/dL 6.6  6.3  6.8   Total Bilirubin 0.3 - 1.2 mg/dL 0.5  0.9  0.8   Alkaline Phos 38 - 126 U/L 68  81  64   AST 15 - 41 U/L 16  29  22    ALT 0 - 44 U/L 28  33  23     RADIOGRAPHIC STUDIES: I have personally reviewed the radiological images as listed and agreed with the findings in the report: Diffuse involving the lymph nodes in the neck, underarms, chest, abdomen and groin. No results found.  ASSESSMENT & PLAN Alex Cortez 72 y.o. male with medical history significant for newly diagnosed diffuse large B-cell lymphoma who presents for a follow up visit.  At this time findings are most consistent with diffuse large B-cell lymphoma stage III.  The difference tween stage III and stage IV is academic as there is no difference in treatment.  We will plan to proceed with 6 cycles of R-CHOP chemotherapy.  Chemotherapy will be administered every 3 weeks with G-CSF therapy to be provided on day 3.  Patient will be administered chemotherapy of full-strength.  His echocardiogram shows good baseline cardiac function.  Also his labs are strong at baseline.  The patient voices understanding of the chemotherapy and the plan moving  forward.  # Diffuse Large B Cell Lymphoma Stage III/IV -- At this time findings are most consistent with a diffuse large B-cell lymphoma arising from follicular lymphoma, stage III/IV.  Would favor stage 3 as there is no clear evidence of organ involvement, may have bone marrow involvement. -- Will proceed with R-CHOP chemotherapy.  Discussed the risks and benefits of this therapy as well as the anticipated side effects and scheduling. -- Cycle 1 Day 1 of R-CHOP chemotherapy on 05/14/2022 Plan: -- Labs today show white blood cell count 6.1, hemoglobin 11.0, MCV 79.7, and platelets of 251 --Since wound has improved and no longer open or draining, okay to proceed with treatment as planned.  --plan for interval PET CT scan after Cycle 3.  --RTC in 3 weeks for Cycle 3 Day 1 of treatment.  #Microcytic anemia: --Labs today show Hgb 11.0 --Iron panel checked today without deficiency.   # Wound Dehiscence/ Possible Wound Infection--improving --  Completed antibiotic therapy with Bactrim. --Was evaluated by surgical team on 05/10/2022 with improvement of wound. They gave clearance to start chemotherapy scheduled for 05/14/2022.   #Supportive Care -- chemotherapy education complete -- port placed -- zofran 8mg  q8H PRN and compazine 10mg  PO q6H for nausea -- allopurinol 300mg  PO daily for TLS prophylaxis -- EMLA cream for port -- no pain medication required at this time.    Orders Placed This Encounter  Procedures   Uric acid    Standing Status:   Future    Standing Expiration Date:   08/05/2023   Lactate dehydrogenase (LDH)    Standing Status:   Future    Standing Expiration Date:   08/05/2023   CBC with Differential ([facility] Only)    Standing Status:   Future    Standing Expiration Date:   08/05/2023   CMP ([facility] only)    Standing Status:   Future    Standing Expiration Date:   08/05/2023    All questions were answered. The patient knows to call the clinic with any problems,  questions or concerns.  A total of more than 30 minutes were spent on this encounter with face-to-face time and non-face-to-face time, including preparing to see the patient, ordering tests and/or medications, counseling the patient and coordination of care as outlined above.   Ledell Peoples, MD Department of Hematology/Oncology [place] at [facility] Phone: [phone] Pager: [phone] Email: [email or web address]  06/06/2022 4:47 PM

## 2022-06-04 NOTE — Patient Instructions (Signed)
[facility] [place]  Discharge Instructions: Thank you for choosing [facility] to provide your oncology and hematology care.   If you have a lab appointment with the [facility], please go directly to the [facility] and check in at the registration area.   Wear comfortable clothing and clothing appropriate for easy access to any Portacath or PICC line.   We strive to give you quality time with your provider. You may need to reschedule your appointment if you arrive late (15 or more minutes).  Arriving late affects you and other patients whose appointments are after yours.  Also, if you miss three or more appointments without notifying the office, you may be dismissed from the clinic at the provider's discretion.      For prescription refill requests, have your pharmacy contact our office and allow 72 hours for refills to be completed.    Today you received the following chemotherapy and/or immunotherapy agents: adriamycin, cytoxan, vincristine, and rituximab-pvvr (ruxience)      To help prevent nausea and vomiting after your treatment, we encourage you to take your nausea medication as directed.  BELOW ARE SYMPTOMS THAT SHOULD BE REPORTED IMMEDIATELY: *FEVER GREATER THAN 100.4 F (38 C) OR HIGHER *CHILLS OR SWEATING *NAUSEA AND VOMITING THAT IS NOT CONTROLLED WITH YOUR NAUSEA MEDICATION *UNUSUAL SHORTNESS OF BREATH *UNUSUAL BRUISING OR BLEEDING *URINARY PROBLEMS (pain or burning when urinating, or frequent urination) *BOWEL PROBLEMS (unusual diarrhea, constipation, pain near the anus) TENDERNESS IN MOUTH AND THROAT WITH OR WITHOUT PRESENCE OF ULCERS (sore throat, sores in mouth, or a toothache) UNUSUAL RASH, SWELLING OR PAIN  UNUSUAL VAGINAL DISCHARGE OR ITCHING   Items with * indicate a potential emergency and should be followed up as soon as possible or go to the Emergency Department if any problems should occur.  Please show the  CHEMOTHERAPY ALERT CARD or IMMUNOTHERAPY ALERT CARD at check-in to the Emergency Department and triage nurse.  Should you have questions after your visit or need to cancel or reschedule your appointment, please contact [facility]  Dept: [phone]  and follow the prompts.  Office hours are 8:00 a.m. to 4:30 p.m. Monday - Friday. Please note that voicemails left after 4:00 p.m. may not be returned until the following business day.  We are closed weekends and major holidays. You have access to a nurse at all times for urgent questions. Please call the main number to the clinic Dept: [phone] and follow the prompts.   For any non-urgent questions, you may also contact your provider using MyChart. We now offer e-Visits for anyone 79 and older to request care online for non-urgent symptoms. For details visit [email or web address].   Also download the MyChart app! Go to the app store, search "MyChart", open the app, select [facility], and log in with your MyChart username and password.

## 2022-06-04 NOTE — Progress Notes (Signed)
V.O. recv'd from Dr. Lorenso Courier to add Pepcid + Singulair to premeds given pts h/o IRR w/ RTX.  Kennith Center, Pharm.D., CPP 06/04/2022@10 :12 AM

## 2022-06-04 NOTE — Progress Notes (Signed)
Pt tolerated treatments well without difficulty, vital signs stable at discharge.

## 2022-06-06 ENCOUNTER — Encounter: Payer: Self-pay | Admitting: Hematology and Oncology

## 2022-06-07 ENCOUNTER — Inpatient Hospital Stay: Payer: Medicare Other

## 2022-06-07 ENCOUNTER — Other Ambulatory Visit: Payer: Self-pay

## 2022-06-07 VITALS — BP 125/70 | HR 80 | Temp 98.9°F | Resp 18

## 2022-06-07 DIAGNOSIS — C8335 Diffuse large B-cell lymphoma, lymph nodes of inguinal region and lower limb: Secondary | ICD-10-CM | POA: Diagnosis not present

## 2022-06-07 DIAGNOSIS — Z5111 Encounter for antineoplastic chemotherapy: Secondary | ICD-10-CM | POA: Diagnosis not present

## 2022-06-07 DIAGNOSIS — Z5189 Encounter for other specified aftercare: Secondary | ICD-10-CM | POA: Diagnosis not present

## 2022-06-07 DIAGNOSIS — C8338 Diffuse large B-cell lymphoma, lymph nodes of multiple sites: Secondary | ICD-10-CM

## 2022-06-07 DIAGNOSIS — D509 Iron deficiency anemia, unspecified: Secondary | ICD-10-CM | POA: Diagnosis not present

## 2022-06-07 DIAGNOSIS — E039 Hypothyroidism, unspecified: Secondary | ICD-10-CM | POA: Diagnosis not present

## 2022-06-07 DIAGNOSIS — Z5112 Encounter for antineoplastic immunotherapy: Secondary | ICD-10-CM | POA: Diagnosis not present

## 2022-06-07 MED ORDER — PEGFILGRASTIM-PBBK 6 MG/0.6ML ~~LOC~~ SOSY
6.0000 mg | PREFILLED_SYRINGE | Freq: Once | SUBCUTANEOUS | Status: AC
  Administered 2022-06-07: 6 mg via SUBCUTANEOUS
  Filled 2022-06-07: qty 0.6

## 2022-06-09 ENCOUNTER — Telehealth: Payer: Self-pay | Admitting: Hematology and Oncology

## 2022-06-09 NOTE — Telephone Encounter (Signed)
Per WQ reached out to patient to schedule.

## 2022-06-10 ENCOUNTER — Other Ambulatory Visit: Payer: Self-pay | Admitting: "Endocrinology

## 2022-06-15 ENCOUNTER — Other Ambulatory Visit: Payer: Self-pay

## 2022-06-20 ENCOUNTER — Other Ambulatory Visit: Payer: Self-pay | Admitting: Hematology and Oncology

## 2022-06-23 ENCOUNTER — Encounter: Payer: Self-pay | Admitting: Hematology and Oncology

## 2022-06-23 ENCOUNTER — Other Ambulatory Visit: Payer: Self-pay | Admitting: Hematology and Oncology

## 2022-06-23 MED ORDER — PREDNISONE 10 MG PO TABS: 60.0000 mg | ORAL_TABLET | Freq: Every day | ORAL | 5 refills | Status: DC

## 2022-06-24 ENCOUNTER — Encounter: Payer: Self-pay | Admitting: *Deleted

## 2022-06-24 ENCOUNTER — Ambulatory Visit: Payer: Self-pay | Admitting: *Deleted

## 2022-06-24 MED FILL — Dexamethasone Sodium Phosphate Inj 100 MG/10ML: INTRAMUSCULAR | Qty: 1 | Status: AC

## 2022-06-24 MED FILL — Fosaprepitant Dimeglumine For IV Infusion 150 MG (Base Eq): INTRAVENOUS | Qty: 5 | Status: AC

## 2022-06-24 NOTE — Patient Outreach (Signed)
Care Coordination   Follow Up Visit Note   06/24/2022 Name: Alex Cortez MRN: [id number] DOB: 1950-06-20  Alex Cortez is a 72 y.o. year old male who sees Burdine, Ananias Pilgrim, MD for primary care. I spoke with  Alex Cortez by phone today.  What matters to the patients health and wellness today?  Managing blood pressure and cancer treatment side effects    Goals Addressed             This Visit's Progress    Lymphoma Management   On track    Care Coordination Goals: Patient will keep all scheduled medical and imaging appointments Next appt is scheduled for tomorrow. Will have a CT scan after this treatment to check for progress Patient will take medications as prescribed Patient will increase activity level as tolerated but will rest as needed Patient will eat a balanced diet to aid in general health, healing, and energy levels Patient will take medication for nausea as needed Patient will talk with oncology team regarding any new or worsening symptoms Patient will remain socially engaged with friends/family Patient will reach out to RN Care Coordinator with any care coordination or resource needs [phone]     Manage HTN   On track    Care Coordination Goals: Patient will take medications as directed and report any negative side effects to provider  Patient will use a pill box/organizer to help keep up with when to take medications Patient will monitor and record blood pressure 3 times a week and as needed and will call PCP or specialist with any readings outside of recommended range Blood pressure was low at last office visit. Will follow-up tomorrow and discuss hypotension if it is still low.  Patient will keep all recommended follow-up appointments with PCP and specialists (cardiology, nephrology, etc) Patient will take blood pressure log to PCP and specialty appointments for review Patient will follow a low sodium/DASH diet  Patient will reach out to RN Care  Coordinator [phone] with any care coordination or resource needs          SDOH assessments and interventions completed:  Yes SDOH Interventions Today    Flowsheet Row Most Recent Value  SDOH Interventions   Transportation Interventions Intervention Not Indicated  Financial Strain Interventions Intervention Not Indicated  [Has not received a bill for cancer treatment services but expects it to be high. Advised to talk with [facility] SW regarding grants or assistance or [facility]illing re: payment options once he gets a bill]        Care Coordination Interventions:  Yes, provided  Interventions Today    Flowsheet Row Most Recent Value  Chronic Disease   Chronic disease during today's visit Hypertension (HTN), Other  [Lymphoma, joint stiffness due to cancer treatment]  General Interventions   General Interventions Discussed/Reviewed General Interventions Discussed, General Interventions Reviewed, Durable Medical Equipment (DME), Doctor Visits  Doctor Visits Discussed/Reviewed Doctor Visits Discussed, Doctor Visits Reviewed, PCP  Durable Medical Equipment (DME) BP Cuff  [check and record blood pressure several times a week and as needed. Take record to provider visits for review.]  PCP/Specialist Visits Compliance with follow-up visit  [Rck BP at oncology appt tomorrow. Hypotensive at last visit.]  Exercise Interventions   Exercise Discussed/Reviewed Physical Activity  Physical Activity Discussed/Reviewed Physical Activity Discussed, Physical Activity Reviewed  Education Interventions   Education Provided Provided Education  Provided Verbal Education On When to see the doctor, Mental Health/Coping with Illness, Medication, Other  [s/s  of hypotension]  Nutrition Interventions   Nutrition Discussed/Reviewed Nutrition Discussed, Nutrition Reviewed  Pharmacy Interventions   Pharmacy Dicussed/Reviewed Medications and their functions  [prednisone for use after cancer treatments, meds  for HTN]  Safety Interventions   Safety Discussed/Reviewed Fall Risk, Safety Discussed       Follow up plan: Follow up call scheduled for 07/23/22    Encounter Outcome:  Pt. Visit Completed   Demetrios Loll, BSN, RN-BC RN Care Coordinator [facility]  [facility] Direct Dial: [phone] Main #: [phone]

## 2022-06-25 ENCOUNTER — Telehealth: Payer: Self-pay | Admitting: Hematology and Oncology

## 2022-06-25 ENCOUNTER — Inpatient Hospital Stay: Payer: Medicare Other | Attending: Physician Assistant

## 2022-06-25 ENCOUNTER — Other Ambulatory Visit: Payer: Self-pay

## 2022-06-25 ENCOUNTER — Inpatient Hospital Stay (HOSPITAL_BASED_OUTPATIENT_CLINIC_OR_DEPARTMENT_OTHER): Payer: Medicare Other | Admitting: Physician Assistant

## 2022-06-25 ENCOUNTER — Inpatient Hospital Stay: Payer: Medicare Other

## 2022-06-25 VITALS — BP 115/65 | HR 72 | Resp 18

## 2022-06-25 DIAGNOSIS — Z95828 Presence of other vascular implants and grafts: Secondary | ICD-10-CM

## 2022-06-25 DIAGNOSIS — Z5111 Encounter for antineoplastic chemotherapy: Secondary | ICD-10-CM | POA: Diagnosis not present

## 2022-06-25 DIAGNOSIS — C8338 Diffuse large B-cell lymphoma, lymph nodes of multiple sites: Secondary | ICD-10-CM | POA: Insufficient documentation

## 2022-06-25 DIAGNOSIS — Z8639 Personal history of other endocrine, nutritional and metabolic disease: Secondary | ICD-10-CM | POA: Diagnosis not present

## 2022-06-25 DIAGNOSIS — Z5189 Encounter for other specified aftercare: Secondary | ICD-10-CM | POA: Insufficient documentation

## 2022-06-25 DIAGNOSIS — Z87891 Personal history of nicotine dependence: Secondary | ICD-10-CM | POA: Diagnosis not present

## 2022-06-25 DIAGNOSIS — Z5112 Encounter for antineoplastic immunotherapy: Secondary | ICD-10-CM | POA: Diagnosis not present

## 2022-06-25 LAB — CBC WITH DIFFERENTIAL (CANCER CENTER ONLY)
Abs Immature Granulocytes: 0.15 10*3/uL — ABNORMAL HIGH (ref 0.00–0.07)
Basophils Absolute: 0.1 10*3/uL (ref 0.0–0.1)
Basophils Relative: 2 %
Eosinophils Absolute: 0.2 10*3/uL (ref 0.0–0.5)
Eosinophils Relative: 3 %
HCT: 34.7 % — ABNORMAL LOW (ref 39.0–52.0)
Hemoglobin: 11.1 g/dL — ABNORMAL LOW (ref 13.0–17.0)
Immature Granulocytes: 2 %
Lymphocytes Relative: 16 %
Lymphs Abs: 1.1 10*3/uL (ref 0.7–4.0)
MCH: 25.9 pg — ABNORMAL LOW (ref 26.0–34.0)
MCHC: 32 g/dL (ref 30.0–36.0)
MCV: 81.1 fL (ref 80.0–100.0)
Monocytes Absolute: 0.4 10*3/uL (ref 0.1–1.0)
Monocytes Relative: 6 %
Neutro Abs: 4.9 10*3/uL (ref 1.7–7.7)
Neutrophils Relative %: 71 %
Platelet Count: 203 10*3/uL (ref 150–400)
RBC: 4.28 MIL/uL (ref 4.22–5.81)
RDW: 18.6 % — ABNORMAL HIGH (ref 11.5–15.5)
WBC Count: 6.9 10*3/uL (ref 4.0–10.5)
nRBC: 0 % (ref 0.0–0.2)

## 2022-06-25 LAB — CMP (CANCER CENTER ONLY)
ALT: 28 U/L (ref 0–44)
AST: 22 U/L (ref 15–41)
Albumin: 3.8 g/dL (ref 3.5–5.0)
Alkaline Phosphatase: 63 U/L (ref 38–126)
Anion gap: 6 (ref 5–15)
BUN: 16 mg/dL (ref 8–23)
CO2: 28 mmol/L (ref 22–32)
Calcium: 9 mg/dL (ref 8.9–10.3)
Chloride: 107 mmol/L (ref 98–111)
Creatinine: 1.13 mg/dL (ref 0.61–1.24)
GFR, Estimated: >60 mL/min (ref >60)
Glucose, Bld: 226 mg/dL — ABNORMAL HIGH (ref 70–99)
Potassium: 4.4 mmol/L (ref 3.5–5.1)
Sodium: 141 mmol/L (ref 135–145)
Total Bilirubin: 0.5 mg/dL (ref 0.3–1.2)
Total Protein: 6.7 g/dL (ref 6.5–8.1)

## 2022-06-25 LAB — URIC ACID: Uric Acid, Serum: 3.1 mg/dL — ABNORMAL LOW (ref 3.7–8.6)

## 2022-06-25 LAB — LACTATE DEHYDROGENASE: LDH: 322 U/L — ABNORMAL HIGH (ref 98–192)

## 2022-06-25 MED ORDER — SODIUM CHLORIDE 0.9% FLUSH
10.0000 mL | INTRAVENOUS | Status: DC | PRN
  Administered 2022-06-25: 10 mL

## 2022-06-25 MED ORDER — SODIUM CHLORIDE 0.9 % IV SOLN
375.0000 mg/m2 | Freq: Once | INTRAVENOUS | Status: AC
  Administered 2022-06-25: 1000 mg via INTRAVENOUS
  Filled 2022-06-25: qty 100

## 2022-06-25 MED ORDER — PALONOSETRON HCL INJECTION 0.25 MG/5ML
0.2500 mg | Freq: Once | INTRAVENOUS | Status: AC
  Administered 2022-06-25: 0.25 mg via INTRAVENOUS
  Filled 2022-06-25: qty 5

## 2022-06-25 MED ORDER — DIPHENHYDRAMINE HCL 25 MG PO CAPS
50.0000 mg | ORAL_CAPSULE | Freq: Once | ORAL | Status: AC
  Administered 2022-06-25: 50 mg via ORAL
  Filled 2022-06-25: qty 2

## 2022-06-25 MED ORDER — MONTELUKAST SODIUM 10 MG PO TABS
10.0000 mg | ORAL_TABLET | Freq: Once | ORAL | Status: AC
  Administered 2022-06-25: 10 mg via ORAL
  Filled 2022-06-25: qty 1

## 2022-06-25 MED ORDER — SODIUM CHLORIDE 0.9 % IV SOLN
750.0000 mg/m2 | Freq: Once | INTRAVENOUS | Status: AC
  Administered 2022-06-25: 2000 mg via INTRAVENOUS
  Filled 2022-06-25: qty 100

## 2022-06-25 MED ORDER — HEPARIN SOD (PORK) LOCK FLUSH 100 UNIT/ML IV SOLN
500.0000 [IU] | Freq: Once | INTRAVENOUS | Status: AC | PRN
  Administered 2022-06-25: 500 [IU]

## 2022-06-25 MED ORDER — VINCRISTINE SULFATE CHEMO INJECTION 1 MG/ML
2.0000 mg | Freq: Once | INTRAVENOUS | Status: AC
  Administered 2022-06-25: 2 mg via INTRAVENOUS
  Filled 2022-06-25: qty 2

## 2022-06-25 MED ORDER — SODIUM CHLORIDE 0.9 % IV SOLN
150.0000 mg | Freq: Once | INTRAVENOUS | Status: AC
  Administered 2022-06-25: 150 mg via INTRAVENOUS
  Filled 2022-06-25: qty 150

## 2022-06-25 MED ORDER — SODIUM CHLORIDE 0.9 % IV SOLN: Freq: Once | INTRAVENOUS | Status: AC

## 2022-06-25 MED ORDER — FAMOTIDINE IN NACL 20-0.9 MG/50ML-% IV SOLN
20.0000 mg | Freq: Once | INTRAVENOUS | Status: AC
  Administered 2022-06-25: 20 mg via INTRAVENOUS
  Filled 2022-06-25: qty 50

## 2022-06-25 MED ORDER — ACETAMINOPHEN 325 MG PO TABS
650.0000 mg | ORAL_TABLET | Freq: Once | ORAL | Status: AC
  Administered 2022-06-25: 650 mg via ORAL
  Filled 2022-06-25: qty 2

## 2022-06-25 MED ORDER — SODIUM CHLORIDE 0.9% FLUSH
10.0000 mL | Freq: Once | INTRAVENOUS | Status: AC
  Administered 2022-06-25: 10 mL

## 2022-06-25 MED ORDER — DOXORUBICIN HCL CHEMO IV INJECTION 2 MG/ML
50.0000 mg/m2 | Freq: Once | INTRAVENOUS | Status: AC
  Administered 2022-06-25: 132 mg via INTRAVENOUS
  Filled 2022-06-25: qty 66

## 2022-06-25 MED ORDER — SODIUM CHLORIDE 0.9 % IV SOLN
10.0000 mg | Freq: Once | INTRAVENOUS | Status: AC
  Administered 2022-06-25: 10 mg via INTRAVENOUS
  Filled 2022-06-25: qty 10

## 2022-06-25 NOTE — Telephone Encounter (Signed)
Reached out to patient to make him aware of appointment changes; left voicemail.

## 2022-06-25 NOTE — Patient Instructions (Addendum)
[facility]  Discharge Instructions: Thank you for choosing [facility] to provide your oncology and hematology care.   If you have a lab appointment with the Cancer Center, please go directly to the Cancer Center and check in at the registration area.   Wear comfortable clothing and clothing appropriate for easy access to any Portacath or PICC line.   We strive to give you quality time with your provider. You may need to reschedule your appointment if you arrive late (15 or more minutes).  Arriving late affects you and other patients whose appointments are after yours.  Also, if you miss three or more appointments without notifying the office, you may be dismissed from the clinic at the provider's discretion.      For prescription refill requests, have your pharmacy contact our office and allow 72 hours for refills to be completed.    Today you received the following chemotherapy and/or immunotherapy agents: Adriamycin, Vincristine, Cytoxan, and Rituximab       To help prevent nausea and vomiting after your treatment, we encourage you to take your nausea medication as directed.  BELOW ARE SYMPTOMS THAT SHOULD BE REPORTED IMMEDIATELY: *FEVER GREATER THAN 100.4 F (38 C) OR HIGHER *CHILLS OR SWEATING *NAUSEA AND VOMITING THAT IS NOT CONTROLLED WITH YOUR NAUSEA MEDICATION *UNUSUAL SHORTNESS OF BREATH *UNUSUAL BRUISING OR BLEEDING *URINARY PROBLEMS (pain or burning when urinating, or frequent urination) *BOWEL PROBLEMS (unusual diarrhea, constipation, pain near the anus) TENDERNESS IN MOUTH AND THROAT WITH OR WITHOUT PRESENCE OF ULCERS (sore throat, sores in mouth, or a toothache) UNUSUAL RASH, SWELLING OR PAIN  UNUSUAL VAGINAL DISCHARGE OR ITCHING   Items with * indicate a potential emergency and should be followed up as soon as possible or go to the Emergency Department if any problems should occur.  Please show the CHEMOTHERAPY ALERT  CARD or IMMUNOTHERAPY ALERT CARD at check-in to the Emergency Department and triage nurse.  Should you have questions after your visit or need to cancel or reschedule your appointment, please contact [facility]  Dept: [phone]  and follow the prompts.  Office hours are 8:00 a.m. to 4:30 p.m. Monday - Friday. Please note that voicemails left after 4:00 p.m. may not be returned until the following business day.  We are closed weekends and major holidays. You have access to a nurse at all times for urgent questions. Please call the main number to the clinic Dept: [phone] and follow the prompts.   For any non-urgent questions, you may also contact your provider using MyChart. We now offer e-Visits for anyone 59 and older to request care online for non-urgent symptoms. For details visit [email or web address].   Also download the MyChart app! Go to the app store, search "MyChart", open the app, select [facility], and log in with your MyChart username and password.

## 2022-06-27 ENCOUNTER — Encounter: Payer: Self-pay | Admitting: Hematology and Oncology

## 2022-06-27 NOTE — Progress Notes (Signed)
[facility] Telephone:([phone]) [phone]   Fax:([phone]  PROGRESS NOTE  Patient Care Team: Juliette Alcide, MD as PCP - General Branch, Dorothe Pea, MD as PCP - Cardiology (Cardiology) Roma Kayser, MD as Consulting Physician (Endocrinology) Gwenith Daily, RN as [facility] Management  Hematological/Oncological History # Diffuse Large B Cell Lymphoma, Stage III 03/16/2022: establish care with Dr. Leonides Schanz [facility] 04/14/2022: excisional lymph node biopsy of right inguinal node shows DLBCL arising from follicular lymphoma 04/21/2022: PET CT scan showed widespread hypermetabolic lymphadenopathy in the neck, chest, abdomen, and pelvis 05/06/2021: Defer Cycle 1 Day 1 of R-CHOP chemotherapy. Patient has wound dehiscence and possible infection.  Seen by surgery same day. 05/14/2022: Cycle 1 Day 1 of R-CHOP chemotherapy 06/03/2022: Cycle 2 Day 1 of R-CHOP chemotherapy 06/25/2022: Cycle 3 Day 1 of R-CHOP chemotherapy  Interval History:  Alex Cortez 72 y.o. male with medical history significant for newly diagnosed diffuse large B-cell lymphoma who presents for a follow up visit. The patient's last visit was on 06/04/2022. In the interim since the last visit he completed Cycle 2 of chemotherapy.   On exam today Alex Cortez is accompanied by his wife.  He reports that he is tolerating his treatment without any significant limitations. He reports his energy levels are fairly stable. He does have some fatigue but is able to do his baseline ADLs. He denies any appetite or weight changes since the last visit. He denies nausea, vomiting or abdominal pain. His bowel habits are unchanged without recurrent episodes of diarrhea or constipation. He denies easy bruising or signs of bleeding. He reports persistent but stable neuropathy in his hands and feet that was present before starting chemotherapy. He reports stable shortness of breath with exertion. He  denies fevers, chills, sweats, chest pain or cough. He has no other complaints. Rest of the 10 point ROS is below.     MEDICAL HISTORY:  Past Medical History:  Diagnosis Date   CAD (coronary artery disease)    nonobstructive CAD/patent LAD stent site; EF 65%, 08/2011; NSTEMI/DES LAD, 6/20 11   Chest pain    Diabetes mellitus    insulin-dependent   Dyslipidemia    HTN (hypertension)    Hypothyroidism    Myocardial infarction 2011   Post MI syndrome 08/29/2009   Stroke 10/22/2012   Left inferior pons    SURGICAL HISTORY: Past Surgical History:  Procedure Laterality Date   COLONOSCOPY  08/2021   CORONARY ANGIOPLASTY WITH STENT PLACEMENT  2011   IR IMAGING GUIDED PORT INSERTION  04/26/2022   LYMPH NODE BIOPSY Right 04/14/2022   Procedure: RIGHT INGUINAL EXCISIONAL LYMPH NODE BIOPSY;  Surgeon: Fritzi Mandes, MD;  Location: [facility];  Service: General;  Laterality: Right;    SOCIAL HISTORY: Social History   Socioeconomic History   Marital status: Married    Spouse name: Not on file   Number of children: Not on file   Years of education: Not on file   Highest education level: Not on file  Occupational History   Not on file  Tobacco Use   Smoking status: Former    Packs/day: 0.50    Years: 23.00    Additional pack years: 0.00    Total pack years: 11.50    Types: Cigarettes    Start date: 03/16/1955    Quit date: 03/15/1988    Years since quitting: 34.3   Smokeless tobacco: Never  Vaping Use   Vaping Use:  Never used  Substance and Sexual Activity   Alcohol use: No    Alcohol/week: 0.0 standard drinks of alcohol   Drug use: No   Sexual activity: Not on file  Other Topics Concern   Not on file  Social History Narrative   Works at [employer]. Married.    Social Determinants of Health   Financial Resource Strain: Low Risk  (06/24/2022)   Overall Financial Resource Strain (CARDIA)    Difficulty of Paying Living Expenses: Not very hard  Food Insecurity: Unknown  (12/01/2021)   Hunger Vital Sign    Worried About Running Out of Food in the Last Year: Not on file    Ran Out of Food in the Last Year: Never true  Transportation Needs: No Transportation Needs (06/24/2022)   PRAPARE - Administrator, Civil Service (Medical): No    Lack of Transportation (Non-Medical): No  Physical Activity: Inactive (05/24/2022)   Exercise Vital Sign    Days of Exercise per Week: 0 days    Minutes of Exercise per Session: 0 min  Stress: No Stress Concern Present (04/07/2022)   [employer] of Occupational Health - Occupational Stress Questionnaire    Feeling of Stress : Not at all  Social Connections: Unknown (07/31/2017)   Social Connection and Isolation Panel [NHANES]    Frequency of Communication with Friends and Family: Patient declined    Frequency of Social Gatherings with Friends and Family: Patient declined    Attends Religious Services: Patient declined    Database administrator or Organizations: Patient declined    Attends Banker Meetings: Patient declined    Marital Status: Patient declined  Intimate Partner Violence: Unknown (07/31/2017)   Humiliation, Afraid, Rape, and Kick questionnaire    Fear of Current or Ex-Partner: Patient declined    Emotionally Abused: Patient declined    Physically Abused: Patient declined    Sexually Abused: Patient declined    FAMILY HISTORY: Family History  Problem Relation Age of Onset   Colon cancer Mother    Hypertension Other        in all family members    ALLERGIES:  is allergic to ace inhibitors, rituxan [rituximab], and shrimp [shellfish allergy].  MEDICATIONS:  Current Outpatient Medications  Medication Sig Dispense Refill   allopurinol (ZYLOPRIM) 300 MG tablet Take 1 tablet (300 mg total) by mouth daily. 90 tablet 1   amLODipine (NORVASC) 10 MG tablet Take 10 mg by mouth daily. for high blood pressure     carvedilol (COREG) 25 MG tablet Take 1 tablet (25 mg total) by mouth 2  (two) times daily. 180 tablet 3   clopidogrel (PLAVIX) 75 MG tablet Take 1 tablet (75 mg total) by mouth daily. 30 tablet 5   Continuous Blood Gluc Receiver (FREESTYLE LIBRE 2 READER) DEVI As directed 1 each 0   Continuous Blood Gluc Sensor (FREESTYLE LIBRE 2 SENSOR) MISC APPLY 1 SENSOR EVERY 14    DAYS 6 each 0   dapagliflozin propanediol (FARXIGA) 5 MG TABS tablet TAKE 1 TABLET BY MOUTH EVERY DAY BEFORE BREAKFAST 90 tablet 0   ezetimibe (ZETIA) 10 MG tablet TAKE 1 TABLET BY MOUTH EVERY DAY 90 tablet 2   fluticasone (FLONASE) 50 MCG/ACT nasal spray Place 2 sprays into both nostrils daily as needed for allergies.     insulin regular human CONCENTRATED (HUMULIN R U-500 KWIKPEN) 500 UNIT/ML KwikPen INJECT AS DIRECTED 70 UNITS WITH BREAKFAST, 70 UNITS WITH LUNCH, AND 70 UNITS WITH SUPPER  FOR PRE-MEAL BLOOD GLUCOSE READINGS OF 90MG /DL OR ABOVE 45 mL 0   isosorbide mononitrate (IMDUR) 30 MG 24 hr tablet TAKE 1 TABLET BY MOUTH EVERY DAY 90 tablet 1   levothyroxine (SYNTHROID) 75 MCG tablet TAKE 1 TABLET BY MOUTH EVERY DAY BEFORE BREAKFAST 90 tablet 0   lidocaine-prilocaine (EMLA) cream Apply 1 Application topically as needed. 30 g 0   losartan (COZAAR) 50 MG tablet Take 1 tablet (50 mg total) by mouth daily.     metFORMIN (GLUCOPHAGE) 500 MG tablet Take 1 tablet (500 mg total) by mouth daily with breakfast. 90 tablet 1   nitroGLYCERIN (NITROSTAT) 0.4 MG SL tablet Place 1 tablet (0.4 mg total) under the tongue every 5 (five) minutes as needed. 25 tablet 3   ondansetron (ZOFRAN) 8 MG tablet Take 1 tablet (8 mg total) by mouth every 8 (eight) hours as needed. 30 tablet 0   ONE TOUCH ULTRA TEST test strip USE TO TEST BLOOD SUGAR FOUR TIMES DAILY E11.65 150 each 5   predniSONE (DELTASONE) 10 MG tablet Take 6 tablets (60 mg total) by mouth daily with breakfast. Take 6 tablets on Day 1 of chemotherapy and continue for 5 days. Repeat with each chemotherapy cycle. (Patient taking differently: Take 20 mg by mouth  daily with breakfast. Take 3 tablets on Day 1 of chemotherapy and continue for 5 days. Repeat with each chemotherapy cycle.) 30 tablet 5   prochlorperazine (COMPAZINE) 10 MG tablet Take 1 tablet (10 mg total) by mouth every 6 (six) hours as needed for nausea or vomiting. 30 tablet 0   rosuvastatin (CRESTOR) 40 MG tablet TAKE 1 TABLET BY MOUTH EVERY DAY 90 tablet 2   No current facility-administered medications for this visit.    REVIEW OF SYSTEMS:   Constitutional: ( - ) fevers, ( - )  chills , ( - ) night sweats Eyes: ( - ) blurriness of vision, ( - ) double vision, ( - ) watery eyes Ears, nose, mouth, throat, and face: ( - ) mucositis, ( - ) sore throat Respiratory: ( - ) cough, ( + ) dyspnea, ( - ) wheezes Cardiovascular: ( - ) palpitation, ( - ) chest discomfort, ( - ) lower extremity swelling Gastrointestinal:  ( - ) nausea, ( - ) heartburn, ( - ) change in bowel habits Skin: ( - ) abnormal skin rashes Lymphatics: ( - ) new lymphadenopathy, ( - ) easy bruising Neurological: ( + ) numbness, ( - ) tingling, ( - ) new weaknesses Behavioral/Psych: ( - ) mood change, ( - ) new changes  All other systems were reviewed with the patient and are negative.  PHYSICAL EXAMINATION: ECOG PERFORMANCE STATUS: 1 - Symptomatic but completely ambulatory  Vitals:   06/25/22 1107  BP: 114/61  Pulse: 73  Resp: 19  Temp: 98.1 F (36.7 C)  SpO2: 100%    Filed Weights   06/25/22 1107  Weight: 281 lb 3.2 oz (127.6 kg)     GENERAL: Well-appearing elderly African-American male alert, no distress and comfortable SKIN: skin color, texture, turgor are normal, no rashes or significant lesions. EYES: conjunctiva are pink and non-injected, sclera clear NECK: supple, non-tender LYMPH:   palpable lymphadenopathy in the cervical and axillary nodes.  LUNGS: clear to auscultation and percussion with normal breathing effort HEART: regular rate & rhythm and no murmurs and no lower extremity  edema Musculoskeletal: no cyanosis of digits and no clubbing  PSYCH: alert & oriented x 3, fluent speech NEURO: no focal  motor/sensory deficits  LABORATORY DATA:  I have reviewed the data as listed    Latest Ref Rng & Units 06/25/2022   10:23 AM 06/04/2022    8:49 AM 05/12/2022   12:25 PM  CBC  WBC 4.0 - 10.5 K/uL 6.9  6.1  4.6   Hemoglobin 13.0 - 17.0 g/dL 67.8  93.8  10.1   Hematocrit 39.0 - 52.0 % 34.7  34.5  35.2   Platelets 150 - 400 K/uL 203  251  192        Latest Ref Rng & Units 06/25/2022   10:23 AM 06/04/2022    8:49 AM 05/12/2022   12:25 PM  CMP  Glucose 70 - 99 mg/dL 751  025  852   BUN 8 - 23 mg/dL 16  15  21    Creatinine 0.61 - 1.24 mg/dL 7.78  2.42  3.53   Sodium 135 - 145 mmol/L 141  140  137   Potassium 3.5 - 5.1 mmol/L 4.4  4.0  4.6   Chloride 98 - 111 mmol/L 107  105  107   CO2 22 - 32 mmol/L 28  29  25    Calcium 8.9 - 10.3 mg/dL 9.0  9.0  8.6   Total Protein 6.5 - 8.1 g/dL 6.7  6.6  6.3   Total Bilirubin 0.3 - 1.2 mg/dL 0.5  0.5  0.9   Alkaline Phos 38 - 126 U/L 63  68  81   AST 15 - 41 U/L 22  16  29    ALT 0 - 44 U/L 28  28  33     RADIOGRAPHIC STUDIES: I have personally reviewed the radiological images as listed and agreed with the findings in the report: Diffuse involving the lymph nodes in the neck, underarms, chest, abdomen and groin. No results found.  ASSESSMENT & PLAN Alex Cortez 72 y.o. male with medical history significant for newly diagnosed diffuse large B-cell lymphoma who presents for a follow up visit.  At this time findings are most consistent with diffuse large B-cell lymphoma stage III.  The difference tween stage III and stage IV is academic as there is no difference in treatment.  We will plan to proceed with 6 cycles of R-CHOP chemotherapy.  Chemotherapy will be administered every 3 weeks with G-CSF therapy to be provided on day 3.  Patient will be administered chemotherapy of full-strength.  His echocardiogram shows good baseline  cardiac function.  Also his labs are strong at baseline.  The patient voices understanding of the chemotherapy and the plan moving forward.  # Diffuse Large B Cell Lymphoma Stage III/IV -- At this time findings are most consistent with a diffuse large B-cell lymphoma arising from follicular lymphoma, stage III/IV.  Would favor stage 3 as there is no clear evidence of organ involvement, may have bone marrow involvement. -- Will proceed with R-CHOP chemotherapy.  Discussed the risks and benefits of this therapy as well as the anticipated side effects and scheduling. -- Cycle 1 Day 1 of R-CHOP chemotherapy on 05/14/2022 Plan: -- Due for Cycle 3, Day 1 today. --Labs today show white blood cell count 6.9, hemoglobin 11.0, MCV 81.1, and platelets of 203. Creatinine and LFTs normal.  --Proceed with treatment today without any dose modifications.   --will order mid-treatment PET scan in the 2-3 weeks.  --RTC in 3 weeks for Cycle 4 Day 1 of treatment.  #Microcytic anemia: --Labs today show Hgb 11.0 --Iron panel checked on 05/12/2022 without deficiency.   #  Wound Dehiscence/ Possible Wound Infection--improving --Completed antibiotic therapy with Bactrim. --Was evaluated by surgical team on 05/10/2022 with improvement of wound. They gave clearance to start chemotherapy scheduled for 05/14/2022.   #Supportive Care -- chemotherapy education complete -- port placed -- zofran  q8H PRN and compazine  PO q6H for nausea -- allopurinol  PO daily for TLS prophylaxis -- EMLA cream for port -- no pain medication required at this time.    No orders of the defined types were placed in this encounter.   All questions were answered. The patient knows to call the clinic with any problems, questions or concerns.  A total of more than 30 minutes were spent on this encounter with face-to-face time and non-face-to-face time, including preparing to see the patient, ordering tests and/or medications,  counseling the patient and coordination of care as outlined above.   Georga Kaufmann PA-C Dept of Hematology and Oncology [facility] Phone: [phone]   06/27/2022 10:45 AM

## 2022-06-28 ENCOUNTER — Inpatient Hospital Stay: Payer: Medicare Other

## 2022-06-28 ENCOUNTER — Other Ambulatory Visit: Payer: Self-pay

## 2022-06-28 VITALS — BP 148/66 | HR 86 | Temp 97.8°F | Resp 20

## 2022-06-28 DIAGNOSIS — C8338 Diffuse large B-cell lymphoma, lymph nodes of multiple sites: Secondary | ICD-10-CM

## 2022-06-28 DIAGNOSIS — Z5112 Encounter for antineoplastic immunotherapy: Secondary | ICD-10-CM | POA: Diagnosis not present

## 2022-06-28 DIAGNOSIS — Z87891 Personal history of nicotine dependence: Secondary | ICD-10-CM | POA: Diagnosis not present

## 2022-06-28 DIAGNOSIS — Z8639 Personal history of other endocrine, nutritional and metabolic disease: Secondary | ICD-10-CM | POA: Diagnosis not present

## 2022-06-28 DIAGNOSIS — Z5111 Encounter for antineoplastic chemotherapy: Secondary | ICD-10-CM | POA: Diagnosis not present

## 2022-06-28 DIAGNOSIS — Z5189 Encounter for other specified aftercare: Secondary | ICD-10-CM | POA: Diagnosis not present

## 2022-06-28 MED ORDER — PEGFILGRASTIM-PBBK 6 MG/0.6ML ~~LOC~~ SOSY
6.0000 mg | PREFILLED_SYRINGE | Freq: Once | SUBCUTANEOUS | Status: AC
  Administered 2022-06-28: 6 mg via SUBCUTANEOUS
  Filled 2022-06-28: qty 0.6

## 2022-07-06 ENCOUNTER — Other Ambulatory Visit: Payer: Self-pay | Admitting: Cardiology

## 2022-07-06 ENCOUNTER — Other Ambulatory Visit: Payer: Self-pay | Admitting: "Endocrinology

## 2022-07-09 ENCOUNTER — Other Ambulatory Visit: Payer: Medicare Other

## 2022-07-09 ENCOUNTER — Ambulatory Visit: Payer: Medicare Other | Admitting: Hematology and Oncology

## 2022-07-09 ENCOUNTER — Ambulatory Visit: Payer: Medicare Other

## 2022-07-11 ENCOUNTER — Other Ambulatory Visit: Payer: Self-pay

## 2022-07-11 ENCOUNTER — Inpatient Hospital Stay (HOSPITAL_COMMUNITY)
Admission: EM | Admit: 2022-07-11 | Discharge: 2022-07-14 | DRG: 872 | Disposition: A | Discharge status: Home Health | Payer: Medicare Other | Attending: Internal Medicine | Admitting: Internal Medicine

## 2022-07-11 ENCOUNTER — Emergency Department (HOSPITAL_COMMUNITY): Payer: Medicare Other

## 2022-07-11 ENCOUNTER — Encounter (HOSPITAL_COMMUNITY): Payer: Self-pay

## 2022-07-11 DIAGNOSIS — I6930 Unspecified sequelae of cerebral infarction: Secondary | ICD-10-CM | POA: Diagnosis not present

## 2022-07-11 DIAGNOSIS — Z7984 Long term (current) use of oral hypoglycemic drugs: Secondary | ICD-10-CM | POA: Diagnosis not present

## 2022-07-11 DIAGNOSIS — A419 Sepsis, unspecified organism: Principal | ICD-10-CM | POA: Diagnosis present

## 2022-07-11 DIAGNOSIS — Z7902 Long term (current) use of antithrombotics/antiplatelets: Secondary | ICD-10-CM

## 2022-07-11 DIAGNOSIS — D696 Thrombocytopenia, unspecified: Secondary | ICD-10-CM | POA: Diagnosis present

## 2022-07-11 DIAGNOSIS — Z6832 Body mass index (BMI) 32.0-32.9, adult: Secondary | ICD-10-CM

## 2022-07-11 DIAGNOSIS — E66811 Obesity, class 1: Secondary | ICD-10-CM

## 2022-07-11 DIAGNOSIS — Z794 Long term (current) use of insulin: Secondary | ICD-10-CM

## 2022-07-11 DIAGNOSIS — R509 Fever, unspecified: Secondary | ICD-10-CM

## 2022-07-11 DIAGNOSIS — I10 Essential (primary) hypertension: Secondary | ICD-10-CM | POA: Diagnosis not present

## 2022-07-11 DIAGNOSIS — N179 Acute kidney failure, unspecified: Secondary | ICD-10-CM | POA: Diagnosis not present

## 2022-07-11 DIAGNOSIS — I110 Hypertensive heart disease with heart failure: Secondary | ICD-10-CM | POA: Diagnosis present

## 2022-07-11 DIAGNOSIS — Z8249 Family history of ischemic heart disease and other diseases of the circulatory system: Secondary | ICD-10-CM | POA: Diagnosis not present

## 2022-07-11 DIAGNOSIS — Z79890 Hormone replacement therapy (postmenopausal): Secondary | ICD-10-CM

## 2022-07-11 DIAGNOSIS — L03314 Cellulitis of groin: Secondary | ICD-10-CM | POA: Diagnosis present

## 2022-07-11 DIAGNOSIS — N289 Disorder of kidney and ureter, unspecified: Secondary | ICD-10-CM | POA: Diagnosis not present

## 2022-07-11 DIAGNOSIS — E1165 Type 2 diabetes mellitus with hyperglycemia: Secondary | ICD-10-CM | POA: Diagnosis not present

## 2022-07-11 DIAGNOSIS — E1159 Type 2 diabetes mellitus with other circulatory complications: Secondary | ICD-10-CM | POA: Diagnosis not present

## 2022-07-11 DIAGNOSIS — R1111 Vomiting without nausea: Secondary | ICD-10-CM | POA: Diagnosis not present

## 2022-07-11 DIAGNOSIS — Z87891 Personal history of nicotine dependence: Secondary | ICD-10-CM | POA: Diagnosis not present

## 2022-07-11 DIAGNOSIS — L02214 Cutaneous abscess of groin: Secondary | ICD-10-CM

## 2022-07-11 DIAGNOSIS — E782 Mixed hyperlipidemia: Secondary | ICD-10-CM | POA: Diagnosis not present

## 2022-07-11 DIAGNOSIS — R000 Tachycardia, unspecified: Secondary | ICD-10-CM | POA: Diagnosis not present

## 2022-07-11 DIAGNOSIS — E6609 Other obesity due to excess calories: Secondary | ICD-10-CM

## 2022-07-11 DIAGNOSIS — D649 Anemia, unspecified: Secondary | ICD-10-CM | POA: Diagnosis not present

## 2022-07-11 DIAGNOSIS — Z1152 Encounter for screening for COVID-19: Secondary | ICD-10-CM | POA: Diagnosis not present

## 2022-07-11 DIAGNOSIS — Z888 Allergy status to other drugs, medicaments and biological substances status: Secondary | ICD-10-CM

## 2022-07-11 DIAGNOSIS — Z955 Presence of coronary angioplasty implant and graft: Secondary | ICD-10-CM | POA: Diagnosis not present

## 2022-07-11 DIAGNOSIS — I69398 Other sequelae of cerebral infarction: Secondary | ICD-10-CM | POA: Diagnosis not present

## 2022-07-11 DIAGNOSIS — L02415 Cutaneous abscess of right lower limb: Secondary | ICD-10-CM | POA: Diagnosis present

## 2022-07-11 DIAGNOSIS — Z79899 Other long term (current) drug therapy: Secondary | ICD-10-CM

## 2022-07-11 DIAGNOSIS — I5032 Chronic diastolic (congestive) heart failure: Secondary | ICD-10-CM

## 2022-07-11 DIAGNOSIS — I2510 Atherosclerotic heart disease of native coronary artery without angina pectoris: Secondary | ICD-10-CM

## 2022-07-11 DIAGNOSIS — I5030 Unspecified diastolic (congestive) heart failure: Secondary | ICD-10-CM | POA: Diagnosis present

## 2022-07-11 DIAGNOSIS — G9341 Metabolic encephalopathy: Secondary | ICD-10-CM | POA: Diagnosis not present

## 2022-07-11 DIAGNOSIS — R6520 Severe sepsis without septic shock: Secondary | ICD-10-CM

## 2022-07-11 DIAGNOSIS — E039 Hypothyroidism, unspecified: Secondary | ICD-10-CM

## 2022-07-11 DIAGNOSIS — C8330 Diffuse large B-cell lymphoma, unspecified site: Secondary | ICD-10-CM

## 2022-07-11 DIAGNOSIS — R110 Nausea: Secondary | ICD-10-CM | POA: Diagnosis not present

## 2022-07-11 DIAGNOSIS — Z91013 Allergy to seafood: Secondary | ICD-10-CM

## 2022-07-11 DIAGNOSIS — I252 Old myocardial infarction: Secondary | ICD-10-CM

## 2022-07-11 DIAGNOSIS — Z800 Family history of malignant neoplasm of digestive organs: Secondary | ICD-10-CM

## 2022-07-11 DIAGNOSIS — Z6834 Body mass index (BMI) 34.0-34.9, adult: Secondary | ICD-10-CM

## 2022-07-11 DIAGNOSIS — Z9221 Personal history of antineoplastic chemotherapy: Secondary | ICD-10-CM

## 2022-07-11 DIAGNOSIS — Z0389 Encounter for observation for other suspected diseases and conditions ruled out: Secondary | ICD-10-CM | POA: Diagnosis not present

## 2022-07-11 LAB — URINALYSIS, ROUTINE W REFLEX MICROSCOPIC
Bacteria, UA: NONE SEEN
Bilirubin Urine: NEGATIVE
Glucose, UA: NEGATIVE mg/dL
Ketones, ur: NEGATIVE mg/dL
Leukocytes,Ua: NEGATIVE
Nitrite: NEGATIVE
Protein, ur: 100 mg/dL — AB
Specific Gravity, Urine: 1.019 (ref 1.005–1.030)
pH: 5 (ref 5.0–8.0)

## 2022-07-11 LAB — CBC WITH DIFFERENTIAL/PLATELET
Abs Immature Granulocytes: 1.13 10*3/uL — ABNORMAL HIGH (ref 0.00–0.07)
Basophils Absolute: 0.1 10*3/uL (ref 0.0–0.1)
Basophils Relative: 0 %
Eosinophils Absolute: 0 10*3/uL (ref 0.0–0.5)
Eosinophils Relative: 0 %
HCT: 31.6 % — ABNORMAL LOW (ref 39.0–52.0)
Hemoglobin: 10.1 g/dL — ABNORMAL LOW (ref 13.0–17.0)
Immature Granulocytes: 8 %
Lymphocytes Relative: 8 %
Lymphs Abs: 1.2 10*3/uL (ref 0.7–4.0)
MCH: 25.8 pg — ABNORMAL LOW (ref 26.0–34.0)
MCHC: 32 g/dL (ref 30.0–36.0)
MCV: 80.8 fL (ref 80.0–100.0)
Monocytes Absolute: 1 10*3/uL (ref 0.1–1.0)
Monocytes Relative: 7 %
Neutro Abs: 11 10*3/uL — ABNORMAL HIGH (ref 1.7–7.7)
Neutrophils Relative %: 77 %
Platelets: 143 10*3/uL — ABNORMAL LOW (ref 150–400)
RBC: 3.91 MIL/uL — ABNORMAL LOW (ref 4.22–5.81)
RDW: 19.1 % — ABNORMAL HIGH (ref 11.5–15.5)
WBC: 14.3 10*3/uL — ABNORMAL HIGH (ref 4.0–10.5)
nRBC: 0 % (ref 0.0–0.2)

## 2022-07-11 LAB — MAGNESIUM: Magnesium: 1.5 mg/dL — ABNORMAL LOW (ref 1.7–2.4)

## 2022-07-11 LAB — TSH: TSH: 0.826 u[IU]/mL (ref 0.350–4.500)

## 2022-07-11 LAB — PROTIME-INR
INR: 1.2 (ref 0.8–1.2)
Prothrombin Time: 15.2 seconds (ref 11.4–15.2)

## 2022-07-11 LAB — GLUCOSE, CAPILLARY
Glucose-Capillary: 338 mg/dL — ABNORMAL HIGH (ref 70–99)
Glucose-Capillary: 358 mg/dL — ABNORMAL HIGH (ref 70–99)

## 2022-07-11 LAB — CULTURE, BLOOD (ROUTINE X 2)

## 2022-07-11 LAB — COMPREHENSIVE METABOLIC PANEL
ALT: 35 U/L (ref 0–44)
AST: 28 U/L (ref 15–41)
Albumin: 3.5 g/dL (ref 3.5–5.0)
Alkaline Phosphatase: 64 U/L (ref 38–126)
Anion gap: 8 (ref 5–15)
BUN: 16 mg/dL (ref 8–23)
CO2: 23 mmol/L (ref 22–32)
Calcium: 8.6 mg/dL — ABNORMAL LOW (ref 8.9–10.3)
Chloride: 104 mmol/L (ref 98–111)
Creatinine, Ser: 1.35 mg/dL — ABNORMAL HIGH (ref 0.61–1.24)
GFR, Estimated: 56 mL/min — ABNORMAL LOW (ref >60)
Glucose, Bld: 112 mg/dL — ABNORMAL HIGH (ref 70–99)
Potassium: 3.9 mmol/L (ref 3.5–5.1)
Sodium: 135 mmol/L (ref 135–145)
Total Bilirubin: 0.8 mg/dL (ref 0.3–1.2)
Total Protein: 6.9 g/dL (ref 6.5–8.1)

## 2022-07-11 LAB — RESP PANEL BY RT-PCR (RSV, FLU A&B, COVID)  RVPGX2
Influenza A by PCR: NEGATIVE
Influenza B by PCR: NEGATIVE
Resp Syncytial Virus by PCR: NEGATIVE
SARS Coronavirus 2 by RT PCR: NEGATIVE

## 2022-07-11 LAB — HEMOGLOBIN A1C
Hgb A1c MFr Bld: 8.5 % — ABNORMAL HIGH (ref 4.8–5.6)
Mean Plasma Glucose: 197.25 mg/dL

## 2022-07-11 LAB — LACTIC ACID, PLASMA: Lactic Acid, Venous: 1.4 mmol/L (ref 0.5–1.9)

## 2022-07-11 LAB — PHOSPHORUS: Phosphorus: 1.3 mg/dL — ABNORMAL LOW (ref 2.5–4.6)

## 2022-07-11 MED ORDER — ACETAMINOPHEN 325 MG PO TABS
650.0000 mg | ORAL_TABLET | Freq: Once | ORAL | Status: AC
  Administered 2022-07-11: 650 mg via ORAL
  Filled 2022-07-11: qty 2

## 2022-07-11 MED ORDER — CHLORHEXIDINE GLUCONATE CLOTH 2 % EX PADS
6.0000 | MEDICATED_PAD | Freq: Every day | CUTANEOUS | Status: DC
  Administered 2022-07-12 – 2022-07-14 (×4): 6 via TOPICAL

## 2022-07-11 MED ORDER — SODIUM CHLORIDE 0.9 % IV SOLN
2.0000 g | Freq: Three times a day (TID) | INTRAVENOUS | Status: DC
  Administered 2022-07-11 – 2022-07-14 (×9): 2 g via INTRAVENOUS
  Filled 2022-07-11 (×10): qty 12.5

## 2022-07-11 MED ORDER — FLUTICASONE PROPIONATE 50 MCG/ACT NA SUSP: 2.0000 | Freq: Every day | NASAL | Status: DC | PRN

## 2022-07-11 MED ORDER — INSULIN ASPART 100 UNIT/ML IJ SOLN
0.0000 [IU] | Freq: Three times a day (TID) | INTRAMUSCULAR | Status: DC
  Administered 2022-07-11 – 2022-07-12 (×2): 7 [IU] via SUBCUTANEOUS
  Administered 2022-07-12: 5 [IU] via SUBCUTANEOUS
  Administered 2022-07-12 – 2022-07-13 (×2): 7 [IU] via SUBCUTANEOUS
  Administered 2022-07-13: 3 [IU] via SUBCUTANEOUS
  Administered 2022-07-13 – 2022-07-14 (×3): 5 [IU] via SUBCUTANEOUS

## 2022-07-11 MED ORDER — ALLOPURINOL 300 MG PO TABS
300.0000 mg | ORAL_TABLET | Freq: Every day | ORAL | Status: DC
  Administered 2022-07-11 – 2022-07-14 (×4): 300 mg via ORAL
  Filled 2022-07-11 (×4): qty 1

## 2022-07-11 MED ORDER — ENOXAPARIN SODIUM 60 MG/0.6ML IJ SOSY
60.0000 mg | PREFILLED_SYRINGE | INTRAMUSCULAR | Status: DC
  Administered 2022-07-11 – 2022-07-14 (×4): 60 mg via SUBCUTANEOUS
  Filled 2022-07-11 (×4): qty 0.6

## 2022-07-11 MED ORDER — VANCOMYCIN HCL IN DEXTROSE 1-5 GM/200ML-% IV SOLN
1000.0000 mg | Freq: Once | INTRAVENOUS | Status: AC
  Administered 2022-07-11: 1000 mg via INTRAVENOUS
  Filled 2022-07-11: qty 200

## 2022-07-11 MED ORDER — ISOSORBIDE MONONITRATE ER 60 MG PO TB24
30.0000 mg | ORAL_TABLET | Freq: Every day | ORAL | Status: DC
  Administered 2022-07-11 – 2022-07-14 (×4): 30 mg via ORAL
  Filled 2022-07-11 (×4): qty 1

## 2022-07-11 MED ORDER — INSULIN ASPART 100 UNIT/ML IJ SOLN
0.0000 [IU] | Freq: Every day | INTRAMUSCULAR | Status: DC
  Administered 2022-07-11 – 2022-07-13 (×3): 5 [IU] via SUBCUTANEOUS

## 2022-07-11 MED ORDER — EZETIMIBE 10 MG PO TABS
10.0000 mg | ORAL_TABLET | Freq: Every day | ORAL | Status: DC
  Administered 2022-07-11 – 2022-07-14 (×4): 10 mg via ORAL
  Filled 2022-07-11 (×4): qty 1

## 2022-07-11 MED ORDER — ONDANSETRON HCL 4 MG PO TABS: 4.0000 mg | ORAL_TABLET | Freq: Four times a day (QID) | ORAL | Status: DC | PRN

## 2022-07-11 MED ORDER — LEVOTHYROXINE SODIUM 75 MCG PO TABS
75.0000 ug | ORAL_TABLET | Freq: Every day | ORAL | Status: DC
  Administered 2022-07-11 – 2022-07-14 (×4): 75 ug via ORAL
  Filled 2022-07-11 (×4): qty 1

## 2022-07-11 MED ORDER — VANCOMYCIN HCL 1500 MG/300ML IV SOLN
1500.0000 mg | INTRAVENOUS | Status: DC
  Administered 2022-07-11 – 2022-07-13 (×3): 1500 mg via INTRAVENOUS
  Filled 2022-07-11 (×3): qty 300

## 2022-07-11 MED ORDER — ONDANSETRON HCL 4 MG/2ML IJ SOLN
4.0000 mg | Freq: Once | INTRAMUSCULAR | Status: AC
  Administered 2022-07-11: 4 mg via INTRAVENOUS
  Filled 2022-07-11: qty 2

## 2022-07-11 MED ORDER — CARVEDILOL 12.5 MG PO TABS
25.0000 mg | ORAL_TABLET | Freq: Two times a day (BID) | ORAL | Status: DC
  Administered 2022-07-11 – 2022-07-14 (×7): 25 mg via ORAL
  Filled 2022-07-11 (×7): qty 2

## 2022-07-11 MED ORDER — IBUPROFEN 400 MG PO TABS
400.0000 mg | ORAL_TABLET | Freq: Once | ORAL | Status: AC
  Administered 2022-07-11: 400 mg via ORAL
  Filled 2022-07-11: qty 1

## 2022-07-11 MED ORDER — INSULIN GLARGINE-YFGN 100 UNIT/ML ~~LOC~~ SOLN
10.0000 [IU] | Freq: Every day | SUBCUTANEOUS | Status: DC
  Administered 2022-07-11: 10 [IU] via SUBCUTANEOUS
  Filled 2022-07-11 (×2): qty 0.1

## 2022-07-11 MED ORDER — METRONIDAZOLE 500 MG/100ML IV SOLN
500.0000 mg | Freq: Once | INTRAVENOUS | Status: AC
  Administered 2022-07-11: 500 mg via INTRAVENOUS
  Filled 2022-07-11: qty 100

## 2022-07-11 MED ORDER — ACETAMINOPHEN 325 MG PO TABS: 650.0000 mg | ORAL_TABLET | Freq: Four times a day (QID) | ORAL | Status: DC | PRN

## 2022-07-11 MED ORDER — LACTATED RINGERS IV SOLN: INTRAVENOUS | Status: DC

## 2022-07-11 MED ORDER — SODIUM CHLORIDE 0.9 % IV SOLN
2.0000 g | Freq: Once | INTRAVENOUS | Status: AC
  Administered 2022-07-11: 2 g via INTRAVENOUS
  Filled 2022-07-11: qty 12.5

## 2022-07-11 MED ORDER — CLOPIDOGREL BISULFATE 75 MG PO TABS
75.0000 mg | ORAL_TABLET | Freq: Every day | ORAL | Status: DC
  Administered 2022-07-11 – 2022-07-12 (×2): 75 mg via ORAL
  Filled 2022-07-11 (×2): qty 1

## 2022-07-11 MED ORDER — LACTATED RINGERS IV BOLUS
1000.0000 mL | Freq: Once | INTRAVENOUS | Status: AC
  Administered 2022-07-11: 1000 mL via INTRAVENOUS

## 2022-07-11 MED ORDER — ONDANSETRON HCL 4 MG/2ML IJ SOLN: 4.0000 mg | Freq: Four times a day (QID) | INTRAMUSCULAR | Status: DC | PRN

## 2022-07-11 MED ORDER — ACETAMINOPHEN 650 MG RE SUPP: 650.0000 mg | Freq: Four times a day (QID) | RECTAL | Status: DC | PRN

## 2022-07-11 MED ORDER — ROSUVASTATIN CALCIUM 20 MG PO TABS
40.0000 mg | ORAL_TABLET | Freq: Every day | ORAL | Status: DC
  Administered 2022-07-11 – 2022-07-14 (×4): 40 mg via ORAL
  Filled 2022-07-11 (×4): qty 2

## 2022-07-11 NOTE — Assessment & Plan Note (Signed)
-  no CP -continue coreg, statin/zetia, imdur and cozaar -continue telemetry monitoring while in stepdown

## 2022-07-11 NOTE — ED Triage Notes (Signed)
Pt arrived via [facility] from home who report family said Pt has not been at baseline X 2 days. Per [facility], Pt last chemo treatment was 3 weeks ago. Pt reports temp of 101F at home and Pt presents with active emesis. Pt denies pain.

## 2022-07-11 NOTE — Assessment & Plan Note (Signed)
-  chronic and compensated -will be judicious with fluid resuscitation  -follow daily weight and strict intake and output

## 2022-07-11 NOTE — Assessment & Plan Note (Signed)
-  continue outpatient follow up with oncology service. -case discussed with Dr. Elbert Ewings -Continue IV antibiotics until patient afebrile for 48 hours and follow culture results to adequately transition therapy to oral route.

## 2022-07-11 NOTE — ED Notes (Signed)
Patient transported to X-ray 

## 2022-07-11 NOTE — Assessment & Plan Note (Signed)
-  Body mass index is 34.12 kg/m. -low calorie diet and portion control discussed with patient

## 2022-07-11 NOTE — Assessment & Plan Note (Signed)
-  patient has met criteria for SIRS/Sepsis given elevated WBC's, fever, tachycardia, tachypnea and AMS. -source still not isolated, but high risk for infection given ongoing chemotherapy and immunocompromised status. -will follow cultures results -started on vancomycin and cefepime  -continue antipyretics, fluid resuscitation and as needed cooling blankets. -follow clinical response.

## 2022-07-11 NOTE — H&P (Signed)
History and Physical    Patient: Alex Cortez ZOX:[id number] DOB: [birth year] DOA: 07/11/2022 DOS: the patient was seen and examined on 07/11/2022 PCP: Juliette Alcide, MD  Patient coming from: Home  Chief Complaint:  Chief Complaint  Patient presents with   Fever   HPI: Alex Cortez is a 72 y.o. male with medical history significant of lymphoma (actively receiving chemotherapy), hypertension, insulin-dependent diabetes, coronary artery disease, hyperlipidemia, history of stroke and hypothyroidism; who presented to the hospital secondary to generalized weakness, subjective fever, nausea/vomiting and decreased oral intake. Patient reports symptom has been present for the last 2 days or so and worsening.  Patient reports feeling feverish and experiencing chills, some intermittent nonproductive coughing spells and also nausea/vomiting with isolated episode of diarrhea the day prior to admission.  Reports no eating anything outside the urinary no sick contacts.  Patient denies chest pain, urinary frequency, urgency, dysuria, focal weaknesses or any other complaints.  In the ED patient was found with significant high temperature in the 104.5 range, tachycardic, confused and is slightly tachypneic with a respiratory rate of 21.  Antipyretics, fluid resuscitation and empiric antibiotics after cultures taken were initiated.  TRH consulted to place patient in the hospital for further evaluation and management.  Of note, chest x-ray not demonstrating acute cardiopulmonary process.  Respiratory  By PCR negative for COVID, RSV and influenza.    Review of Systems: As mentioned in the history of present illness. All other systems reviewed and are negative.  Past Medical History:  Diagnosis Date   CAD (coronary artery disease)    nonobstructive CAD/patent LAD stent site; EF 65%, 08/2011; NSTEMI/DES LAD, 6/20 11   Chest pain    Diabetes mellitus    insulin-dependent   Dyslipidemia    HTN  (hypertension)    Hypothyroidism    Myocardial infarction [facility]) 2011   Post MI syndrome (HCC) 08/29/2009   Stroke (HCC) 10/22/2012   Left inferior pons   Past Surgical History:  Procedure Laterality Date   COLONOSCOPY  08/2021   CORONARY ANGIOPLASTY WITH STENT PLACEMENT  2011   IR IMAGING GUIDED PORT INSERTION  04/26/2022   LYMPH NODE BIOPSY Right 04/14/2022   Procedure: RIGHT INGUINAL EXCISIONAL LYMPH NODE BIOPSY;  Surgeon: Fritzi Mandes, MD;  Location: [facility];  Service: General;  Laterality: Right;   Social History:  reports that he quit smoking about 34 years ago. His smoking use included cigarettes. He started smoking about 67 years ago. He has a 11.50 pack-year smoking history. He has never used smokeless tobacco. He reports that he does not drink alcohol and does not use drugs.  Allergies  Allergen Reactions   Ace Inhibitors Swelling    Angioedema   Rituxan [Rituximab] Other (See Comments)    Pt had sensitivity reaction to Rituxan. See progress note from 05/14/22. Pt able to complete infusion.    Shrimp [Shellfish Allergy] Hives and Swelling    Family History  Problem Relation Age of Onset   Colon cancer Mother    Hypertension Other        in all family members    Prior to Admission medications   Medication Sig Start Date End Date Taking? Authorizing Provider  allopurinol (ZYLOPRIM) 300 MG tablet Take 1 tablet (300 mg total) by mouth daily. 04/29/22  Yes Jaci Standard, MD  amLODipine (NORVASC) 10 MG tablet Take 10 mg by mouth daily. 10/05/18  Yes [provider]  carvedilol (COREG) 25 MG tablet Take 1 tablet (  25 mg total) by mouth 2 (two) times daily. 12/04/21  Yes BranchDorothe Pea, MD  clopidogrel (PLAVIX) 75 MG tablet Take 1 tablet (75 mg total) by mouth daily. 11/28/17  Yes McCue, Shanda Bumps, NP  dapagliflozin propanediol (FARXIGA) 5 MG TABS tablet TAKE 1 TABLET BY MOUTH EVERY DAY BEFORE BREAKFAST Patient taking differently: Take 5 mg by mouth daily before  breakfast. 06/10/22  Yes Nida, Denman George, MD  ezetimibe (ZETIA) 10 MG tablet TAKE 1 TABLET BY MOUTH EVERY DAY 04/26/22  Yes Branch, Dorothe Pea, MD  fluticasone (FLONASE) 50 MCG/ACT nasal spray Place 2 sprays into both nostrils daily as needed for allergies. 05/31/18  Yes [provider]  insulin regular human CONCENTRATED (HUMULIN R U-500 KWIKPEN) 500 UNIT/ML KwikPen INJECT AS DIRECTED 70 UNITS WITH BREAKFAST, 70 UNITS WITH LUNCH, AND 70 UNITS WITH SUPPER FOR PRE-MEAL BLOOD GLUCOSE READINGS OF 90MG /DL OR ABOVE Patient taking differently: Inject 70 Units into the skin with breakfast, with lunch, and with evening meal. For pre-meal glucose readings of 90mg /dl or above. 05/03/22  Yes Nida, Denman George, MD  isosorbide mononitrate (IMDUR) 30 MG 24 hr tablet TAKE 1 TABLET BY MOUTH EVERY DAY Patient taking differently: Take 30 mg by mouth daily. 04/26/22  Yes Branch, Dorothe Pea, MD  levothyroxine (SYNTHROID) 75 MCG tablet TAKE 1 TABLET BY MOUTH EVERY DAY BEFORE BREAKFAST Patient taking differently: Take 75 mcg by mouth daily before breakfast. 07/07/22  Yes Nida, Denman George, MD  lidocaine-prilocaine (EMLA) cream Apply 1 Application topically as needed. Patient taking differently: Apply 1 Application topically daily as needed (port access). 04/29/22  Yes Jaci Standard, MD  losartan (COZAAR) 50 MG tablet Take 1 tablet (50 mg total) by mouth daily. 12/11/21  Yes Antoine Poche, MD  metFORMIN (GLUCOPHAGE) 500 MG tablet Take 1 tablet (500 mg total) by mouth daily with breakfast. 05/03/22  Yes Nida, Denman George, MD  ondansetron (ZOFRAN) 8 MG tablet Take 1 tablet (8 mg total) by mouth every 8 (eight) hours as needed. Patient taking differently: Take 8 mg by mouth every 8 (eight) hours as needed for vomiting, nausea or refractory nausea / vomiting. 04/29/22  Yes Jaci Standard, MD  predniSONE (DELTASONE) 20 MG tablet Take 60 mg by mouth See admin instructions. 60 mg once daily for 5 days,  starting on day 1 of chemotherapy. Repeat with each chemotherapy cycle.   Yes [provider]  prochlorperazine (COMPAZINE) 10 MG tablet Take 1 tablet (10 mg total) by mouth every 6 (six) hours as needed for nausea or vomiting. 04/29/22  Yes Jaci Standard, MD  rosuvastatin (CRESTOR) 40 MG tablet TAKE 1 TABLET BY MOUTH EVERY DAY 07/07/22  Yes Branch, Dorothe Pea, MD  Continuous Blood Gluc Receiver (FREESTYLE LIBRE 2 READER) DEVI As directed 08/21/21   Roma Kayser, MD  Continuous Blood Gluc Sensor (FREESTYLE LIBRE 2 SENSOR) MISC APPLY 1 SENSOR EVERY 14    DAYS 04/13/22   Roma Kayser, MD  nitroGLYCERIN (NITROSTAT) 0.4 MG SL tablet Place 1 tablet (0.4 mg total) under the tongue every 5 (five) minutes as needed. Patient not taking: Reported on 07/11/2022 08/30/11   de Natasha Bence, Vernie Shanks, MD  ONE TOUCH ULTRA TEST test strip USE TO TEST BLOOD SUGAR FOUR TIMES DAILY E11.65 12/16/15   Roma Kayser, MD  predniSONE (DELTASONE) 10 MG tablet Take 6 tablets (60 mg total) by mouth daily with breakfast. Take 6 tablets on Day 1 of chemotherapy and continue for  5 days. Repeat with each chemotherapy cycle. Patient not taking: Reported on 07/11/2022 06/23/22   Jaci Standard, MD    Physical Exam: Vitals:   07/11/22 1649 07/11/22 1700 07/11/22 1800 07/11/22 1900  BP:  (!) 122/50 (!) 121/50 (!) 119/52  Pulse:  78 78 76  Resp:  (!) 21 17 16   Temp: 98.8 F (37.1 C)     TempSrc: Axillary     SpO2:  96% 99% 98%  Weight:      Height:       General exam: Alert, awake, oriented x 3 at time of evaluation; reports no chest pain, and currently not feeling nauseous.  Still warm to touch. Respiratory system: Good air movement, no using accessory muscles; good saturation on room air. Cardiovascular system:RRR.  No rubs, no gallops, no JVD on exam. Gastrointestinal system: Abdomen is obese, nondistended, soft and nontender. No organomegaly or masses felt. Normal bowel sounds heard. Central  nervous system: No focal neurological deficits. Extremities: No cyanosis or clubbing; trace edema appreciated bilaterally (unchanged from baseline). Skin: No petechiae. Psychiatry: Judgement and insight appear normal.  Data Reviewed: Comprehensive metabolic panel: Sodium 135, potassium 3.9, chloride 104, bicarb 23, BUN 16, creatinine 1.35, normal LFTs; GFR 56. CBC: WBCs 14.3 with indices patient received viral booster after chemo a week ago); hemoglobin 10.1, platelets count 143 K Lactic acid: 1.4  Assessment and Plan: * Sepsis (HCC) -patient has met criteria for SIRS/Sepsis given elevated WBC's, fever, tachycardia, tachypnea and AMS. -source still not isolated, but high risk for infection given ongoing chemotherapy and immunocompromised status. -will follow cultures results -started on vancomycin and cefepime  -continue antipyretics, fluid resuscitation and as needed cooling blankets. -follow clinical response.   AKI (acute kidney injury) (HCC) -in the setting of pre-renal azotemia most likely -follow UA, minimize nephrotoxic agents and provide fluid resuscitation -will follow renal function trend  DLBCL (diffuse large B cell lymphoma) (HCC) -continue outpatient follow up with oncology service. -case discussed with Dr. Elbert Ewings  Hypothyroidism -check TSH -continue synthroid  History of CVA with residual deficit -no new focal deficit  -continue risk factor modification -continue aspirin for secondary prevention  Class 1 obesity due to excess calories with serious comorbidity and body mass index (BMI) of 32.0 to 32.9 in adult -Body mass index is 34.12 kg/m. -low calorie diet and portion control discussed with patient  Essential hypertension, benign -stable and well controlled -Continue current antihypertensive regimen  -follow vital signs trend  Mixed hyperlipidemia -continue statin/zetia.  Diastolic heart failure (HCC) -chronic and compensated -will be judicious  with fluid resuscitation  -follow daily weight and strict intake and output   CORONARY ATHEROSCLEROSIS NATIVE CORONARY ARTERY -no CP -continue coreg, statin/zetia, imdur and cozaar -continue telemetry monitoring while in stepdown   Type 2 diabetes mellitus with vascular disease (HCC) Update A1c -start sliding scale insulin and semglee QHS -Follow CBGs fluctuation and further adjust hypoglycemic regimen as needed.     Advance Care Planning:   Code Status: Full Code   Consults: oncology service curbside.  Family Communication: wife at bedside.  Severity of Illness: The appropriate patient status for this patient is INPATIENT. Inpatient status is judged to be reasonable and necessary in order to provide the required intensity of service to ensure the patient's safety. The patient's presenting symptoms, physical exam findings, and initial radiographic and laboratory data in the context of their chronic comorbidities is felt to place them at high risk for further clinical deterioration. Furthermore, it is not  anticipated that the patient will be medically stable for discharge from the hospital within 2 midnights of admission.   * I certify that at the point of admission it is my clinical judgment that the patient will require inpatient hospital care spanning beyond 2 midnights from the point of admission due to high intensity of service, high risk for further deterioration and high frequency of surveillance required.*  Author: Vassie Loll, MD 07/11/2022 7:32 PM  For on call review [email or web address].

## 2022-07-11 NOTE — Assessment & Plan Note (Signed)
-   check TSH - continue synthroid  

## 2022-07-11 NOTE — Assessment & Plan Note (Signed)
-  no new focal deficit  -continue risk factor modification -continue aspirin for secondary prevention

## 2022-07-11 NOTE — ED Notes (Signed)
Pt returned from [facility].

## 2022-07-11 NOTE — Assessment & Plan Note (Signed)
-  in the setting of pre-renal azotemia most likely -follow UA, minimize nephrotoxic agents and provide fluid resuscitation -will follow renal function trend

## 2022-07-11 NOTE — ED Provider Notes (Signed)
[place] EMERGENCY DEPARTMENT AT [facility] Provider Note   CSN: [id number] Arrival date & time: 07/11/22  0151     History  Chief Complaint  Patient presents with   Fever    Alex Cortez is a 72 y.o. male.  The history is provided by the patient and the spouse.  Fever He has history of hypertension, diabetes, hyperlipidemia, coronary artery disease, stroke, lymphoma currently on chemotherapy and comes in because of weakness and subjective fever with chills.  He started getting ill tonight when he started having chills and subjective fever.  He has had rhinorrhea but does not know the color of the mucus.  He denies sore throat or coug.  He has had some shortness of breath.  He vomited once and had some diarrhea at home but is unclear how many times he had diarrhea.  He denies any abdominal pain.  He denies urinary urgency, frequency, tenesmus, dysuria.  He denies any sick contacts.   Home Medications Prior to Admission medications   Medication Sig Start Date End Date Taking? Authorizing Provider  allopurinol (ZYLOPRIM) 300 MG tablet Take 1 tablet (300 mg total) by mouth daily. 04/29/22   Jaci Standard, MD  amLODipine (NORVASC) 10 MG tablet Take 10 mg by mouth daily. for high blood pressure 10/05/18   [provider]  carvedilol (COREG) 25 MG tablet Take 1 tablet (25 mg total) by mouth 2 (two) times daily. 12/04/21   Antoine Poche, MD  clopidogrel (PLAVIX) 75 MG tablet Take 1 tablet (75 mg total) by mouth daily. 11/28/17   Ihor Austin, NP  Continuous Blood Gluc Receiver (FREESTYLE LIBRE 2 READER) DEVI As directed 08/21/21   Roma Kayser, MD  Continuous Blood Gluc Sensor (FREESTYLE LIBRE 2 SENSOR) MISC APPLY 1 SENSOR EVERY 14    DAYS 04/13/22   Roma Kayser, MD  dapagliflozin propanediol (FARXIGA) 5 MG TABS tablet TAKE 1 TABLET BY MOUTH EVERY DAY BEFORE BREAKFAST 06/10/22   Roma Kayser, MD  ezetimibe (ZETIA) 10 MG tablet TAKE 1  TABLET BY MOUTH EVERY DAY 04/26/22   Antoine Poche, MD  fluticasone (FLONASE) 50 MCG/ACT nasal spray Place 2 sprays into both nostrils daily as needed for allergies. 05/31/18   [provider]  insulin regular human CONCENTRATED (HUMULIN R U-500 KWIKPEN) 500 UNIT/ML KwikPen INJECT AS DIRECTED 70 UNITS WITH BREAKFAST, 70 UNITS WITH LUNCH, AND 70 UNITS WITH SUPPER FOR PRE-MEAL BLOOD GLUCOSE READINGS OF 90MG /DL OR ABOVE 06/22/79   Roma Kayser, MD  isosorbide mononitrate (IMDUR) 30 MG 24 hr tablet TAKE 1 TABLET BY MOUTH EVERY DAY 04/26/22   Antoine Poche, MD  levothyroxine (SYNTHROID) 75 MCG tablet TAKE 1 TABLET BY MOUTH EVERY DAY BEFORE BREAKFAST 07/07/22   Roma Kayser, MD  lidocaine-prilocaine (EMLA) cream Apply 1 Application topically as needed. 04/29/22   Jaci Standard, MD  losartan (COZAAR) 50 MG tablet Take 1 tablet (50 mg total) by mouth daily. 12/11/21   Antoine Poche, MD  metFORMIN (GLUCOPHAGE) 500 MG tablet Take 1 tablet (500 mg total) by mouth daily with breakfast. 05/03/22   Nida, Denman George, MD  nitroGLYCERIN (NITROSTAT) 0.4 MG SL tablet Place 1 tablet (0.4 mg total) under the tongue every 5 (five) minutes as needed. 08/30/11   de Melanie Crazier, MD  ondansetron (ZOFRAN) 8 MG tablet Take 1 tablet (8 mg total) by mouth every 8 (eight) hours as needed. 04/29/22   Jeanie Sewer  T IV, MD  ONE TOUCH ULTRA TEST test strip USE TO TEST BLOOD SUGAR FOUR TIMES DAILY E11.65 12/16/15   Roma Kayser, MD  predniSONE (DELTASONE) 10 MG tablet Take 6 tablets (60 mg total) by mouth daily with breakfast. Take 6 tablets on Day 1 of chemotherapy and continue for 5 days. Repeat with each chemotherapy cycle. Patient taking differently: Take 20 mg by mouth daily with breakfast. Take 3 tablets on Day 1 of chemotherapy and continue for 5 days. Repeat with each chemotherapy cycle. 06/23/22   Jaci Standard, MD  prochlorperazine (COMPAZINE) 10 MG tablet Take 1 tablet (10 mg  total) by mouth every 6 (six) hours as needed for nausea or vomiting. 04/29/22   Jaci Standard, MD  rosuvastatin (CRESTOR) 40 MG tablet TAKE 1 TABLET BY MOUTH EVERY DAY 07/07/22   Antoine Poche, MD      Allergies    Ace inhibitors, Rituxan [rituximab], and Shrimp [shellfish allergy]    Review of Systems   Review of Systems  Constitutional:  Positive for fever.  All other systems reviewed and are negative.   Physical Exam Updated Vital Signs BP (!) 165/56   Pulse (!) 104   Temp (!) 104.7 F (40.4 C) (Rectal)   Resp (!) 27   Ht 6\' 3"  (1.905 m)   Wt 123.8 kg   SpO2 95%   BMI 34.12 kg/m  Physical Exam Vitals and nursing note reviewed.   72 year old male, resting comfortably and in no acute distress. Vital signs are significant for elevated heart rate, respiratory rate, blood pressure, temperature. Oxygen saturation is 95%, which is normal. Head is normocephalic and atraumatic. PERRLA, EOMI. Oropharynx is clear. Neck is nontender and supple without adenopathy or JVD. Back is nontender and there is no CVA tenderness. Lungs are clear without rales, wheezes, or rhonchi. Chest is nontender.  Mediport is present on the right. Heart has regular rate and rhythm without murmur. Abdomen is soft, flat, nontender. Extremities have no cyanosis or edema, full range of motion is present. Skin is warm and dry without rash. Neurologic: Mental status is normal, cranial nerves are intact, moves all extremities equally.  ED Results / Procedures / Treatments   Labs (all labs ordered are listed, but only abnormal results are displayed) Labs Reviewed  COMPREHENSIVE METABOLIC PANEL - Abnormal; Notable for the following components:      Result Value   Glucose, Bld 112 (*)    Creatinine, Ser 1.35 (*)    Calcium 8.6 (*)    GFR, Estimated 56 (*)    All other components within normal limits  CBC WITH DIFFERENTIAL/PLATELET - Abnormal; Notable for the following components:   WBC 14.3 (*)     RBC 3.91 (*)    Hemoglobin 10.1 (*)    HCT 31.6 (*)    MCH 25.8 (*)    RDW 19.1 (*)    Platelets 143 (*)    Neutro Abs 11.0 (*)    Abs Immature Granulocytes 1.13 (*)    All other components within normal limits  RESP PANEL BY RT-PCR (RSV, FLU A&B, COVID)  RVPGX2  CULTURE, BLOOD (ROUTINE X 2)  CULTURE, BLOOD (ROUTINE X 2)  LACTIC ACID, PLASMA  PROTIME-INR  URINALYSIS, ROUTINE W REFLEX MICROSCOPIC    EKG EKG Interpretation  Date/Time:  Sunday July 11 2022 02:01:30 EDT Ventricular Rate:  104 PR Interval:  165 QRS Duration: 70 QT Interval:  365 QTC Calculation: 481 R Axis:  29 Text Interpretation: Sinus tachycardia Abnormal R-wave progression, early transition Borderline T wave abnormalities Borderline prolonged QT interval Atrial escape complexes 02/13/2022, No previous ECGs available Confirmed by Dione Booze ([id number]) on 07/11/2022 2:23:32 AM  Radiology DG Chest 2 View  Result Date: 07/11/2022 CLINICAL DATA:  Possible sepsis EXAM: CHEST - 2 VIEW COMPARISON:  02/13/2022 FINDINGS: Cardiac shadow is enlarged but stable. Right chest wall port is noted in satisfactory position. Lungs are well aerated bilaterally. No focal infiltrate or effusion is seen. No bony abnormality is noted. IMPRESSION: No active cardiopulmonary disease. Electronically Signed   By: Alcide Clever M.D.   On: 07/11/2022 02:37    Procedures Procedures  Cardiac monitor shows normal sinus rhythm, per my interpretation.  Medications Ordered in ED Medications  vancomycin (VANCOCIN) IVPB 1000 mg/200 mL premix (has no administration in time range)  metroNIDAZOLE (FLAGYL) IVPB 500 mg (has no administration in time range)  ceFEPIme (MAXIPIME) 2 g in sodium chloride 0.9 % 100 mL IVPB (has no administration in time range)  ondansetron (ZOFRAN) injection 4 mg (4 mg Intravenous Given 07/11/22 0245)  acetaminophen (TYLENOL) tablet 650 mg (650 mg Oral Given 07/11/22 0245)  ibuprofen (ADVIL) tablet 400 mg (400 mg Oral Given  07/11/22 0348)  lactated ringers bolus 1,000 mL (1,000 mLs Intravenous New Bag/Given 07/11/22 0604)    ED Course/ Medical Decision Making/ A&P                             Medical Decision Making Amount and/or Complexity of Data Reviewed Labs: ordered. Radiology: ordered.  Risk OTC drugs. Prescription drug management.   Fever and patient getting chemotherapy for lymphoma.  I have reviewed his past records, and his last chemotherapy was on 06/25/2022 at which time he received doxorubicin, vincristine, cyclophosphamide, rituximab.  On 06/28/2022 he had an infusion of pegfilgrastim.  Because of timing of his chemotherapy, he is definitely at risk for neutropenia.  Differential diagnosis of fever includes pneumonia, urinary tract infection, viral infections.  Chest x-ray shows no active cardiopulmonary disease.  I have independently viewed the images, and agree with the radiologist interpretation.  I have reviewed and interpreted his electrocardiogram, and my interpretation is borderline T wave flattening unchanged from prior.  I have reviewed and interpreted his laboratory tests, my interpretation is normal lactic acid level, mildly elevated creatinine which is slightly increased compared with 06/25/2022 but similar to what it was on 05/12/2022, leukocytosis with left shift consistent with infection, anemia which has progressed since 06/25/2022, borderline thrombocytopenia which is not felt to be clinically significant.  I have also ordered blood cultures and urinalysis, which are pending.  I have ordered ondansetron for nausea and acetaminophen for fever.  Respiratory pathogen panel is negative for influenza, COVID-19, RSV.  Temperature did not come down with acetaminophen.  I ordered ibuprofen with very little change in temperature.  He is now showing some confusion.  I have ordered additional IV fluids.  Because of persistence of fever and confusion, I feel he needs to be admitted.  I have started  antibiotics for sepsis of undetermined cause.  I have discussed the case with Dr. Carren Rang of [facility], who agrees to admit the patient.  CRITICAL CARE Performed by: Dione Booze Total critical care time: 60 minutes Critical care time was exclusive of separately billable procedures and treating other patients. Critical care was necessary to treat or prevent imminent or life-threatening deterioration. Critical care was time spent personally  by me on the following activities: development of treatment plan with patient and/or surrogate as well as nursing, discussions with consultants, evaluation of patient's response to treatment, examination of patient, obtaining history from patient or surrogate, ordering and performing treatments and interventions, ordering and review of laboratory studies, ordering and review of radiographic studies, pulse oximetry and re-evaluation of patient's condition.  Final Clinical Impression(s) / ED Diagnoses Final diagnoses:  Fever, unspecified fever cause  Renal insufficiency  Normocytic anemia  Thrombocytopenia (HCC)    Rx / DC Orders ED Discharge Orders     None         Dione Booze, MD 07/11/22 [phone]

## 2022-07-11 NOTE — Assessment & Plan Note (Signed)
-  stable and well controlled -Continue current antihypertensive regimen  -follow vital signs trend

## 2022-07-11 NOTE — Progress Notes (Signed)
Pharmacy Antibiotic Note  Alex Cortez is a 72 y.o. male admitted on 07/11/2022 with sepsis.  Pharmacy has been consulted for Vancomycin and cefepime dosing.  Plan: Vancomycin 1500 mg IV Q 24 hrs. Goal AUC 400-550. Expected AUC: 453 SCr used: 1.35 Cefepime 2gm IV q8h F/U cxs and clinical progress Monitor V/S, labs and levels as indicated   Height: 6\' 3"  (190.5 cm) Weight: 123.8 kg (273 lb) IBW/kg (Calculated) : 84.5  Temp (24hrs), Avg:102.9 F (39.4 C), Min:100.3 F (37.9 C), Max:104.7 F (40.4 C)  Recent Labs  Lab 07/11/22 0204  WBC 14.3*  CREATININE 1.35*  LATICACIDVEN 1.4    Estimated Creatinine Clearance: 70.1 mL/min (A) (by C-G formula based on SCr of 1.35 mg/dL (H)).    Allergies  Allergen Reactions   Ace Inhibitors Swelling    Angioedema   Rituxan [Rituximab] Other (See Comments)    Pt had sensitivity reaction to Rituxan. See progress note from 05/14/22. Pt able to complete infusion.    Shrimp [Shellfish Allergy] Hives and Swelling    Antimicrobials this admission: Vancomycin  4/28 >>  cefepime 4/28 >>  Metronidazole 4.28 x 1 dose in ED   Microbiology results: 4/28 BCx: pending 4/28 UCx: pending   MRSA PCR:   Thank you for allowing pharmacy to be a part of this patient's care.  Elder Cyphers, BS Pharm D, BCPS Clinical Pharmacist 07/11/2022 11:54 AM

## 2022-07-11 NOTE — Assessment & Plan Note (Signed)
Update A1c -start sliding scale insulin and semglee QHS -Follow CBGs fluctuation and further adjust hypoglycemic regimen as needed.

## 2022-07-11 NOTE — Assessment & Plan Note (Signed)
-  continue statin/zetia.

## 2022-07-12 ENCOUNTER — Telehealth: Payer: Self-pay | Admitting: *Deleted

## 2022-07-12 ENCOUNTER — Ambulatory Visit: Payer: Medicare Other

## 2022-07-12 DIAGNOSIS — R6520 Severe sepsis without septic shock: Secondary | ICD-10-CM

## 2022-07-12 DIAGNOSIS — I5032 Chronic diastolic (congestive) heart failure: Secondary | ICD-10-CM

## 2022-07-12 DIAGNOSIS — D696 Thrombocytopenia, unspecified: Secondary | ICD-10-CM

## 2022-07-12 DIAGNOSIS — R509 Fever, unspecified: Secondary | ICD-10-CM

## 2022-07-12 DIAGNOSIS — I10 Essential (primary) hypertension: Secondary | ICD-10-CM

## 2022-07-12 DIAGNOSIS — I2510 Atherosclerotic heart disease of native coronary artery without angina pectoris: Secondary | ICD-10-CM

## 2022-07-12 DIAGNOSIS — C8330 Diffuse large B-cell lymphoma, unspecified site: Secondary | ICD-10-CM

## 2022-07-12 DIAGNOSIS — I6930 Unspecified sequelae of cerebral infarction: Secondary | ICD-10-CM

## 2022-07-12 DIAGNOSIS — E6609 Other obesity due to excess calories: Secondary | ICD-10-CM

## 2022-07-12 DIAGNOSIS — E039 Hypothyroidism, unspecified: Secondary | ICD-10-CM

## 2022-07-12 DIAGNOSIS — N289 Disorder of kidney and ureter, unspecified: Secondary | ICD-10-CM

## 2022-07-12 DIAGNOSIS — G9341 Metabolic encephalopathy: Secondary | ICD-10-CM

## 2022-07-12 DIAGNOSIS — A419 Sepsis, unspecified organism: Secondary | ICD-10-CM

## 2022-07-12 DIAGNOSIS — Z6832 Body mass index (BMI) 32.0-32.9, adult: Secondary | ICD-10-CM

## 2022-07-12 DIAGNOSIS — E1159 Type 2 diabetes mellitus with other circulatory complications: Secondary | ICD-10-CM

## 2022-07-12 DIAGNOSIS — L02214 Cutaneous abscess of groin: Secondary | ICD-10-CM

## 2022-07-12 LAB — BASIC METABOLIC PANEL
Anion gap: 8 (ref 5–15)
BUN: 20 mg/dL (ref 8–23)
CO2: 23 mmol/L (ref 22–32)
Calcium: 8.1 mg/dL — ABNORMAL LOW (ref 8.9–10.3)
Chloride: 102 mmol/L (ref 98–111)
Creatinine, Ser: 1.26 mg/dL — ABNORMAL HIGH (ref 0.61–1.24)
GFR, Estimated: >60 mL/min (ref >60)
Glucose, Bld: 314 mg/dL — ABNORMAL HIGH (ref 70–99)
Potassium: 4.1 mmol/L (ref 3.5–5.1)
Sodium: 133 mmol/L — ABNORMAL LOW (ref 135–145)

## 2022-07-12 LAB — CBC
HCT: 28.3 % — ABNORMAL LOW (ref 39.0–52.0)
Hemoglobin: 9 g/dL — ABNORMAL LOW (ref 13.0–17.0)
MCH: 25.9 pg — ABNORMAL LOW (ref 26.0–34.0)
MCHC: 31.8 g/dL (ref 30.0–36.0)
MCV: 81.6 fL (ref 80.0–100.0)
Platelets: 116 10*3/uL — ABNORMAL LOW (ref 150–400)
RBC: 3.47 MIL/uL — ABNORMAL LOW (ref 4.22–5.81)
RDW: 19.3 % — ABNORMAL HIGH (ref 11.5–15.5)
WBC: 18.7 10*3/uL — ABNORMAL HIGH (ref 4.0–10.5)
nRBC: 0 % (ref 0.0–0.2)

## 2022-07-12 LAB — AEROBIC/ANAEROBIC CULTURE W GRAM STAIN (SURGICAL/DEEP WOUND)

## 2022-07-12 LAB — PHOSPHORUS: Phosphorus: 3 mg/dL (ref 2.5–4.6)

## 2022-07-12 LAB — MRSA NEXT GEN BY PCR, NASAL: MRSA by PCR Next Gen: NOT DETECTED

## 2022-07-12 LAB — GLUCOSE, CAPILLARY
Glucose-Capillary: 277 mg/dL — ABNORMAL HIGH (ref 70–99)
Glucose-Capillary: 325 mg/dL — ABNORMAL HIGH (ref 70–99)
Glucose-Capillary: 332 mg/dL — ABNORMAL HIGH (ref 70–99)
Glucose-Capillary: 363 mg/dL — ABNORMAL HIGH (ref 70–99)

## 2022-07-12 LAB — MAGNESIUM: Magnesium: 1.6 mg/dL — ABNORMAL LOW (ref 1.7–2.4)

## 2022-07-12 MED ORDER — MAGNESIUM SULFATE IN D5W 1-5 GM/100ML-% IV SOLN
1.0000 g | Freq: Once | INTRAVENOUS | Status: AC
  Administered 2022-07-12: 1 g via INTRAVENOUS
  Filled 2022-07-12: qty 100

## 2022-07-12 MED ORDER — INSULIN GLARGINE-YFGN 100 UNIT/ML ~~LOC~~ SOLN
25.0000 [IU] | Freq: Every day | SUBCUTANEOUS | Status: DC
  Administered 2022-07-12: 25 [IU] via SUBCUTANEOUS
  Filled 2022-07-12 (×2): qty 0.25

## 2022-07-12 NOTE — Consult Note (Signed)
[facility] Consult  Reason for Consult: Right groin abscess Referring Physician: Dr. Gwenlyn Perking  Chief Complaint   Fever     HPI: Alex Cortez is a 72 y.o. male who was admitted with fever.  His past medical history significant for coronary artery disease with history of MI and stent placement, stroke, hypertension, hypothyroidism, and diabetes who was recently diagnosed with lymphoma.  He underwent a right inguinal lymph node biopsy with Dr. Freida Busman on 04/14/22, which was positive for lymphoma.  He subsequently underwent port placement and has started chemotherapy.  He was having some difficulty with healing at the superior aspect of his incision site, but every time he followed up with his surgeon, he was told that it was healing well.  He denies any issues with the incision site prior to presenting to the hospital, but after he presented, he noted that there started to be purulent drainage from the incision site.  General surgery was consulted to evaluate for right groin abscess.  He has no other history of any kind of surgeries.  He is currently taking Plavix for his history of strokes and MI with his last dose being received today.  He has been afebrile today, but had a Tmax of 101 yesterday.  He has a leukocytosis of 18.7, from 14.3 yesterday.  Past Medical History:  Diagnosis Date   CAD (coronary artery disease)    nonobstructive CAD/patent LAD stent site; EF 65%, 08/2011; NSTEMI/DES LAD, 6/20 11   Chest pain    Diabetes mellitus    insulin-dependent   Dyslipidemia    HTN (hypertension)    Hypothyroidism    Myocardial infarction (HCC) 2011   Post MI syndrome (HCC) 08/29/2009   Stroke (HCC) 10/22/2012   Left inferior pons    Past Surgical History:  Procedure Laterality Date   COLONOSCOPY  08/2021   CORONARY ANGIOPLASTY WITH STENT PLACEMENT  2011   IR IMAGING GUIDED PORT INSERTION  04/26/2022   LYMPH NODE BIOPSY Right 04/14/2022   Procedure: RIGHT INGUINAL  EXCISIONAL LYMPH NODE BIOPSY;  Surgeon: Fritzi Mandes, MD;  Location: [facility];  Service: General;  Laterality: Right;    Family History  Problem Relation Age of Onset   Colon cancer Mother    Hypertension Other        in all family members    Social History   Tobacco Use   Smoking status: Former    Packs/day: 0.50    Years: 23.00    Additional pack years: 0.00    Total pack years: 11.50    Types: Cigarettes    Start date: 03/16/1955    Quit date: 03/15/1988    Years since quitting: 34.3   Smokeless tobacco: Never  Vaping Use   Vaping Use: Never used  Substance Use Topics   Alcohol use: No    Alcohol/week: 0.0 standard drinks of alcohol   Drug use: No    Medications: I have reviewed the patient's current medications.  Allergies  Allergen Reactions   Ace Inhibitors Swelling    Angioedema   Rituxan [Rituximab] Other (See Comments)    Pt had sensitivity reaction to Rituxan. See progress note from 05/14/22. Pt able to complete infusion.    Shrimp [Shellfish Allergy] Hives and Swelling     ROS:  Pertinent items are noted in HPI.  Blood pressure 129/63, pulse 84, temperature 98.8 F (37.1 C), temperature source Oral, resp. rate (!) 21, height 6\' 3"  (1.905 m), weight 135.5 kg, SpO2 99 %.  Physical Exam Vitals reviewed.  Constitutional:      Appearance: Normal appearance.  HENT:     Head: Normocephalic and atraumatic.  Eyes:     Extraocular Movements: Extraocular movements intact.     Pupils: Pupils are equal, round, and reactive to light.  Cardiovascular:     Rate and Rhythm: Normal rate.  Pulmonary:     Effort: Pulmonary effort is normal.  Abdominal:     General: There is no distension.     Palpations: Abdomen is soft.     Tenderness: There is no abdominal tenderness.  Musculoskeletal:        General: Normal range of motion.     Cervical back: Normal range of motion.  Skin:    General: Skin is warm and dry.     Comments: Right groin incision site with 2 cm  opening and purulent drainage, surrounding induration and erythema, minimally tender to palpation  Neurological:     General: No focal deficit present.     Mental Status: He is alert and oriented to person, place, and time.  Psychiatric:        Mood and Affect: Mood normal.        Behavior: Behavior normal.     Results: Results for orders placed or performed during the hospital encounter of 07/11/22 (from the past 48 hour(s))  Resp panel by RT-PCR (RSV, Flu A&B, Covid) Anterior Nasal Swab     Status: None   Collection Time: 07/11/22  2:03 AM   Specimen: Anterior Nasal Swab  Result Value Ref Range   SARS Coronavirus 2 by RT PCR NEGATIVE NEGATIVE    Comment: (NOTE) SARS-CoV-2 target nucleic acids are NOT DETECTED.  The SARS-CoV-2 RNA is generally detectable in upper respiratory specimens during the acute phase of infection. The lowest concentration of SARS-CoV-2 viral copies this assay can detect is 138 copies/mL. A negative result does not preclude SARS-Cov-2 infection and should not be used as the sole basis for treatment or other patient management decisions. A negative result may occur with  improper specimen collection/handling, submission of specimen other than nasopharyngeal swab, presence of viral mutation(s) within the areas targeted by this assay, and inadequate number of viral copies(<138 copies/mL). A negative result must be combined with clinical observations, patient history, and epidemiological information. The expected result is Negative.  Fact Sheet for Patients:  BloggerCourse.com  Fact Sheet for Healthcare Providers:  SeriousBroker.it  This test is no t yet approved or cleared by the [place] FDA and  has been authorized for detection and/or diagnosis of SARS-CoV-2 by FDA under an Emergency Use Authorization (EUA). This EUA will remain  in effect (meaning this test can be used) for the duration of  the COVID-19 declaration under Section 564(b)(1) of the Act, 21 U.S.C.section 360bbb-3(b)(1), unless the authorization is terminated  or revoked sooner.       Influenza A by PCR NEGATIVE NEGATIVE   Influenza B by PCR NEGATIVE NEGATIVE    Comment: (NOTE) The Xpert Xpress SARS-CoV-2/FLU/RSV plus assay is intended as an aid in the diagnosis of influenza from Nasopharyngeal swab specimens and should not be used as a sole basis for treatment. Nasal washings and aspirates are unacceptable for Xpert Xpress SARS-CoV-2/FLU/RSV testing.  Fact Sheet for Patients: BloggerCourse.com  Fact Sheet for Healthcare Providers: SeriousBroker.it  This test is not yet approved or cleared by the [place] FDA and has been authorized for detection and/or diagnosis of SARS-CoV-2 by FDA under an Emergency Use Authorization (EUA).  This EUA will remain in effect (meaning this test can be used) for the duration of the COVID-19 declaration under Section 564(b)(1) of the Act, 21 U.S.C. section 360bbb-3(b)(1), unless the authorization is terminated or revoked.     Resp Syncytial Virus by PCR NEGATIVE NEGATIVE    Comment: (NOTE) Fact Sheet for Patients: BloggerCourse.com  Fact Sheet for Healthcare Providers: SeriousBroker.it  This test is not yet approved or cleared by the [place] FDA and has been authorized for detection and/or diagnosis of SARS-CoV-2 by FDA under an Emergency Use Authorization (EUA). This EUA will remain in effect (meaning this test can be used) for the duration of the COVID-19 declaration under Section 564(b)(1) of the Act, 21 U.S.C. section 360bbb-3(b)(1), unless the authorization is terminated or revoked.  Performed at [facility], [address], [place], [place] [address]   Comprehensive metabolic panel     Status: Abnormal   Collection Time: 07/11/22  2:04 AM  Result  Value Ref Range   Sodium 135 135 - 145 mmol/L   Potassium 3.9 3.5 - 5.1 mmol/L   Chloride 104 98 - 111 mmol/L   CO2 23 22 - 32 mmol/L   Glucose, Bld 112 (H) 70 - 99 mg/dL    Comment: Glucose reference range applies only to samples taken after fasting for at least 8 hours.   BUN 16 8 - 23 mg/dL   Creatinine, Ser 9.14 (H) 0.61 - 1.24 mg/dL   Calcium 8.6 (L) 8.9 - 10.3 mg/dL   Total Protein 6.9 6.5 - 8.1 g/dL   Albumin 3.5 3.5 - 5.0 g/dL   AST 28 15 - 41 U/L   ALT 35 0 - 44 U/L   Alkaline Phosphatase 64 38 - 126 U/L   Total Bilirubin 0.8 0.3 - 1.2 mg/dL   GFR, Estimated 56 (L) >60 mL/min    Comment: (NOTE) Calculated using the CKD-EPI Creatinine Equation (2021)    Anion gap 8 5 - 15    Comment: Performed at [facility], [address] [place], [place] [address]  Lactic acid, plasma     Status: None   Collection Time: 07/11/22  2:04 AM  Result Value Ref Range   Lactic Acid, Venous 1.4 0.5 - 1.9 mmol/L    Comment: Performed at [facility], [address] [place], [place] [address]  CBC with Differential     Status: Abnormal   Collection Time: 07/11/22  2:04 AM  Result Value Ref Range   WBC 14.3 (H) 4.0 - 10.5 K/uL   RBC 3.91 (L) 4.22 - 5.81 MIL/uL   Hemoglobin 10.1 (L) 13.0 - 17.0 g/dL   HCT 86.5 (L) 78.4 - 69.6 %   MCV 80.8 80.0 - 100.0 fL   MCH 25.8 (L) 26.0 - 34.0 pg   MCHC 32.0 30.0 - 36.0 g/dL   RDW 29.5 (H) 28.4 - 13.2 %   Platelets 143 (L) 150 - 400 K/uL   nRBC 0.0 0.0 - 0.2 %   Neutrophils Relative % 77 %   Neutro Abs 11.0 (H) 1.7 - 7.7 K/uL   Lymphocytes Relative 8 %   Lymphs Abs 1.2 0.7 - 4.0 K/uL   Monocytes Relative 7 %   Monocytes Absolute 1.0 0.1 - 1.0 K/uL   Eosinophils Relative 0 %   Eosinophils Absolute 0.0 0.0 - 0.5 K/uL   Basophils Relative 0 %   Basophils Absolute 0.1 0.0 - 0.1 K/uL   Immature Granulocytes 8 %   Abs Immature Granulocytes 1.13 (H)  0.00 - 0.07 K/uL    Comment: Performed at [facility], [address] [place], [place] [address]   Protime-INR     Status: None   Collection Time: 07/11/22  2:04 AM  Result Value Ref Range   Prothrombin Time 15.2 11.4 - 15.2 seconds   INR 1.2 0.8 - 1.2    Comment: (NOTE) INR goal varies based on device and disease states. Performed at [facility], [address], [place], [place] [address]   Culture, blood (Routine x 2)     Status: None (Preliminary result)   Collection Time: 07/11/22  2:04 AM   Specimen: Site Not Specified; Blood  Result Value Ref Range   Specimen Description      SITE NOT SPECIFIED BOTTLES DRAWN AEROBIC AND ANAEROBIC   Special Requests Blood Culture adequate volume    Culture      NO GROWTH < 24 HOURS Performed at [facility], [address] [place], [place] [address]    Report Status PENDING   Culture, blood (Routine x 2)     Status: None (Preliminary result)   Collection Time: 07/11/22  2:09 AM   Specimen: Left Antecubital; Blood  Result Value Ref Range   Specimen Description      LEFT ANTECUBITAL BOTTLES DRAWN AEROBIC AND ANAEROBIC   Special Requests Blood Culture adequate volume    Culture      NO GROWTH < 24 HOURS Performed at [facility], [address] [place] [address] [place], [place] [address]    Report Status PENDING   Urinalysis, Routine w reflex microscopic -Urine, Clean Catch     Status: Abnormal   Collection Time: 07/11/22  5:55 AM  Result Value Ref Range   Color, Urine AMBER (A) YELLOW    Comment: BIOCHEMICALS MAY BE AFFECTED BY COLOR   APPearance CLEAR CLEAR   Specific Gravity, Urine 1.019 1.005 - 1.030   pH 5.0 5.0 - 8.0   Glucose, UA NEGATIVE NEGATIVE mg/dL   Hgb urine dipstick SMALL (A) NEGATIVE   Bilirubin Urine NEGATIVE NEGATIVE   Ketones, ur NEGATIVE NEGATIVE mg/dL   Protein, ur 295 (A) NEGATIVE mg/dL   Nitrite NEGATIVE NEGATIVE   Leukocytes,Ua NEGATIVE NEGATIVE   RBC / HPF 0-5 0 - 5 RBC/hpf   WBC, UA 0-5 0 - 5 WBC/hpf   Bacteria, UA NONE SEEN NONE SEEN   Squamous Epithelial / HPF 0-5 0 - 5 /HPF    Comment: Performed at [facility], [address], [place], [place] [address]  Magnesium     Status: Abnormal   Collection Time: 07/11/22  7:36 AM  Result Value Ref Range   Magnesium 1.5 (L) 1.7 - 2.4 mg/dL    Comment: Performed at [facility], [address] [place], [place] [address]  Phosphorus     Status: Abnormal   Collection Time: 07/11/22  7:36 AM  Result Value Ref Range   Phosphorus 1.3 (L) 2.5 - 4.6 mg/dL    Comment: Performed at [facility], [address], [place], [place] [address]  TSH     Status: None   Collection Time: 07/11/22  7:37 AM  Result Value Ref Range   TSH 0.826 0.350 - 4.500 uIU/mL    Comment: Performed by a 3rd Generation assay with a functional sensitivity of <=0.01 uIU/mL. Performed at [facility], [address], [place], [place] [address]   Hemoglobin A1c     Status: Abnormal   Collection Time: 07/11/22  7:37 AM  Result Value Ref Range  Hgb A1c MFr Bld 8.5 (H) 4.8 - 5.6 %    Comment: (NOTE) Pre diabetes:          5.7%-6.4%  Diabetes:              >6.4%  Glycemic control for   <7.0% adults with diabetes    Mean Plasma Glucose 197.25 mg/dL    Comment: Performed at [facility], [address], [place], [place] [address]  Glucose, capillary     Status: Abnormal   Collection Time: 07/11/22  4:48 PM  Result Value Ref Range   Glucose-Capillary 338 (H) 70 - 99 mg/dL    Comment: Glucose reference range applies only to samples taken after fasting for at least 8 hours.  Glucose, capillary     Status: Abnormal   Collection Time: 07/11/22  8:23 PM  Result Value Ref Range   Glucose-Capillary 358 (H) 70 - 99 mg/dL    Comment: Glucose reference range applies only to samples taken after fasting for at least 8 hours.   Comment 1 Notify RN    Comment 2 Document in Chart   Basic metabolic panel     Status: Abnormal   Collection Time: 07/12/22  5:07 AM  Result Value Ref Range   Sodium 133 (L) 135 - 145 mmol/L   Potassium 4.1 3.5 - 5.1 mmol/L   Chloride 102 98 - 111 mmol/L   CO2 23  22 - 32 mmol/L   Glucose, Bld 314 (H) 70 - 99 mg/dL    Comment: Glucose reference range applies only to samples taken after fasting for at least 8 hours.   BUN 20 8 - 23 mg/dL   Creatinine, Ser 3.08 (H) 0.61 - 1.24 mg/dL   Calcium 8.1 (L) 8.9 - 10.3 mg/dL   GFR, Estimated >65 >78 mL/min    Comment: (NOTE) Calculated using the CKD-EPI Creatinine Equation (2021)    Anion gap 8 5 - 15    Comment: Performed at [facility], [address] [place], [place] [address]  CBC     Status: Abnormal   Collection Time: 07/12/22  5:07 AM  Result Value Ref Range   WBC 18.7 (H) 4.0 - 10.5 K/uL   RBC 3.47 (L) 4.22 - 5.81 MIL/uL   Hemoglobin 9.0 (L) 13.0 - 17.0 g/dL   HCT 95.2 (L) 84.1 - 32.4 %   MCV 81.6 80.0 - 100.0 fL   MCH 25.9 (L) 26.0 - 34.0 pg   MCHC 31.8 30.0 - 36.0 g/dL   RDW 40.1 (H) 02.7 - 25.3 %   Platelets 116 (L) 150 - 400 K/uL   nRBC 0.0 0.0 - 0.2 %    Comment: Performed at [facility], [address], [place], [place] [address]  Magnesium     Status: Abnormal   Collection Time: 07/12/22  5:07 AM  Result Value Ref Range   Magnesium 1.6 (L) 1.7 - 2.4 mg/dL    Comment: Performed at [facility], [address], [place], [place] [address]  Phosphorus     Status: None   Collection Time: 07/12/22  5:07 AM  Result Value Ref Range   Phosphorus 3.0 2.5 - 4.6 mg/dL    Comment: Performed at [facility], [address], [place], [place] [address]  Glucose, capillary     Status: Abnormal   Collection Time: 07/12/22  7:28 AM  Result Value Ref Range   Glucose-Capillary 277 (H) 70 - 99 mg/dL    Comment: Glucose reference range applies  only to samples taken after fasting for at least 8 hours.  Glucose, capillary     Status: Abnormal   Collection Time: 07/12/22 10:57 AM  Result Value Ref Range   Glucose-Capillary 325 (H) 70 - 99 mg/dL    Comment: Glucose reference range applies only to samples taken after fasting for at least 8 hours.    [place] EKG SITE RITE  Result Date: 07/11/2022 If [facility] image not attached, placement could not be confirmed due to current cardiac rhythm.  DG Chest 2 View  Result Date: 07/11/2022 CLINICAL DATA:  Possible sepsis EXAM: CHEST - 2 VIEW COMPARISON:  02/13/2022 FINDINGS: Cardiac shadow is enlarged but stable. Right chest wall port is noted in satisfactory position. Lungs are well aerated bilaterally. No focal infiltrate or effusion is seen. No bony abnormality is noted. IMPRESSION: No active cardiopulmonary disease. Electronically Signed   By: Alcide Clever M.D.   On: 07/11/2022 02:37     Assessment & Plan:  Alex Cortez is a 72 y.o. male who was admitted with fevers and noted to have a right groin abscess at previous right inguinal lymph node biopsy site.  Imaging and blood work evaluated by myself.  -I suspect that this patient's right groin abscess is likely the source of his infection/fevers.  At bedside, I debrided the fibrinous exudate and was able to evacuate a large amount of pus.  Culture was obtained.  I then irrigated the abscess cavity with saline until the fluid returned clear.  Packed the abscess cavity with damp gauze -Given his current Plavix usage, will attempt bedside management of this abscess, but if he fails to improve or starts to become more septic, he may require operative intervention to obtain source control -Continue broad-spectrum IV antibiotics -Await results of culture -Hold Plavix -I will plan to see patient and check packing tomorrow.  If packing becomes saturated, nursing staff should replace gauze packing with new saline dampened gauze -Care per primary team  All questions were answered to the satisfaction of the patient.  -- Theophilus Kinds, DO [facility] [address] [place], [place] [address] [phone] (office)

## 2022-07-12 NOTE — Assessment & Plan Note (Signed)
-  Right groin/thigh abscess from previous lymph node biopsy -Appreciate general surgery recommendation -Follow culture results -Plavix have been placed on hold in case the surgical intervention is needed -Continue current IV antibiotics.

## 2022-07-12 NOTE — TOC Initial Note (Signed)
Transition of Care [facility]) - Initial/Assessment Note    Patient Details  Name: Alex Cortez MRN: [id number] Date of Birth: 04/30/1950  Transition of [facility]) CM/SW Contact:    Annice Needy, LCSW Phone Number: 07/12/2022, 11:17 AM  Clinical Narrative:                 Patient from home with spouse. Admitted for sepsis. Independent at baseline, drives, no DME in the home. Considered high risk for readmission.   Expected Discharge Plan: Home/Self Care Barriers to Discharge: Continued Medical Work up   Patient Goals and CMS Choice Patient states their goals for this hospitalization and ongoing recovery are:: retun home          Expected Discharge Plan and Services       Living arrangements for the past 2 months: Single Family Home                                      Prior Living Arrangements/Services Living arrangements for the past 2 months: Single Family Home Lives with:: Spouse                   Activities of Daily Living Home Assistive Devices/Equipment: None ADL Screening (condition at time of admission) Patient's cognitive ability adequate to safely complete daily activities?: Yes Is the patient deaf or have difficulty hearing?: No Does the patient have difficulty seeing, even when wearing glasses/contacts?: No Does the patient have difficulty concentrating, remembering, or making decisions?: No Patient able to express need for assistance with ADLs?: Yes Does the patient have difficulty dressing or bathing?: No Independently performs ADLs?: Yes (appropriate for developmental age) Does the patient have difficulty walking or climbing stairs?: No Weakness of Legs: None Weakness of Arms/Hands: None  Permission Sought/Granted Permission sought to share information with : Family Supports    Share Information with NAME: Maureen Ralphs, wife           Emotional Assessment       Orientation: : Oriented to Self, Oriented to Place, Oriented to   Time, Oriented to Situation Alcohol / Substance Use: Not Applicable Psych Involvement: No (comment)  Admission diagnosis:  Normocytic anemia [D64.9] Thrombocytopenia (HCC) [D69.6] Renal insufficiency [N28.9] Sepsis (HCC) [A41.9] Fever, unspecified fever cause [R50.9] Patient Active Problem List   Diagnosis Date Noted   Sepsis (HCC) 07/11/2022   AKI (acute kidney injury) (HCC) 07/11/2022   Port-A-Cath in place 05/05/2022   DLBCL (diffuse large B cell lymphoma) (HCC) 04/23/2022   Class 2 severe obesity due to excess calories with serious comorbidity and body mass index (BMI) of 35.0 to 35.9 in adult [facility]) 12/28/2021   Hypothyroidism 01/12/2018   Cerebral embolism with cerebral infarction 08/01/2017   CVA (cerebral vascular accident) (HCC) 08/01/2017   History of CVA with residual deficit    Coronary artery disease involving native coronary artery of native heart without angina pectoris    Essential hypertension    Diabetes mellitus type 2 in obese    Class 1 obesity due to excess calories with serious comorbidity and body mass index (BMI) of 32.0 to 32.9 in adult 09/09/2015   Sedentary lifestyle 09/09/2015   Mixed hyperlipidemia 02/24/2015   Essential hypertension, benign 02/24/2015   Exertional dyspnea 10/18/2012   Diastolic heart failure (HCC) 10/01/2011   Obstructive sleep apnea 09/02/2011   LV dysfunction 09/02/2011   ERECTILE DYSFUNCTION, SECONDARY TO MEDICATION 04/14/2010  Type 2 diabetes mellitus with vascular disease (HCC) 10/02/2009   ACUT MI SUBENDOCARDIAL INFARCT INIT EPIS CARE 10/02/2009   CORONARY ATHEROSCLEROSIS NATIVE CORONARY ARTERY 10/02/2009   CHEST PAIN 10/02/2009   PCP:  Juliette Alcide, MD Pharmacy:   [phone] [place], [place] - [address] [facility] [address] [place] [address] Phone: [phone] Fax: [phone]  [facility] [place], [place] - [address] [facility] AT [address] to  Reg[facility] [address] [place] [address] Phone: [phone] Fax: [phone]  [phone] [place] [facility] [address] [place] [phone] Phone: [phone] Fax: [phone]     Social Determinants of Health (SDOH) Social History: SDOH Screenings   Food Insecurity: No Food Insecurity (07/11/2022)  Housing: Low Risk  (07/11/2022)  Transportation Needs: No Transportation Needs (07/11/2022)  Utilities: Not At Risk (07/11/2022)  Depression (PHQ2-9): Low Risk  (04/07/2022)  Financial Resource Strain: Low Risk  (06/24/2022)  Physical Activity: Inactive (05/24/2022)  Social Connections: Unknown (07/31/2017)  Stress: No Stress Concern Present (04/07/2022)  Tobacco Use: Medium Risk (07/11/2022)   SDOH Interventions:     Readmission Risk Interventions     No data to display

## 2022-07-12 NOTE — Telephone Encounter (Signed)
Received call from pt's wife. She states that her husband was having fever, chills, diarrhea, vomiting over the wekend. Mrs. Duch took pt to the ED @ [facility] where he had a fever >104. He was admitted to the ICU for Sepsis. She also states that he is having purulent drainage from his biopsy site in the right groin area.  She states surgery has been consulted to evaluate this. She does not know when he will be discharged. He is scheduled for a PET scan on 07/15/22 and then here with port flush with labs, clinic visit with Georga Kaufmann, PA and chemo.  Wife was asking if these will still happen. Advised that it depends on when he is discharged from the hospital. Best case scenario is he could be d/c by Wednesday so he can get his scan and then see Georga Kaufmann, PA on Friday. Final decision regarding getting chemo would be made at this appt.  Advised that the wife call here when her husband is discharged and we will take it from there. Wife voiced understanding.

## 2022-07-12 NOTE — Procedures (Signed)
Bedside procedure note   Preoperative Diagnosis: Right groin abscess Postoperative Diagnosis: Right groin abscess   Procedure(s) Performed: Incision and drainage of right groin abscess   Performing provider: Santina Evans Falon Flinchum, DO   Estimated Blood Loss: Minimal   Findings: Right groin abscess this contained copious amount of pus   Procedure: Verbal consent obtained from the patient prior to beginning of procedure.  Using scissors, fibrinous exudate at the superior aspect of previous incision site was excised.  A 2 cm open area was noted at the superior aspect of the scar.  Purulent drainage was expressed from the opening.  Wound culture was obtained.  Abscess cavity was then copiously irrigated with saline until saline returned clear.  Mechanical debridement was also performed with dry gauze.  Abscess cavity was packed with saline dampened gauze and covered with 4 x 4 and Medipore tape.  Patient tolerated the procedure without issue.  Theophilus Kinds, DO [facility] [address] [place], [place] [address] [phone] (office)

## 2022-07-12 NOTE — Progress Notes (Signed)
Progress Note   Patient: Alex Cortez ZOX:[id number] DOB: 02-13-1951 DOA: 07/11/2022     1 DOS: the patient was seen and examined on 07/12/2022   Brief hospital admission course:  Alex Cortez is a 72 y.o. male with medical history significant of lymphoma (actively receiving chemotherapy), hypertension, insulin-dependent diabetes, coronary artery disease, hyperlipidemia, history of stroke and hypothyroidism; who presented to the hospital secondary to generalized weakness, subjective fever, nausea/vomiting and decreased oral intake. Patient reports symptom has been present for the last 2 days or so and worsening.   Patient reports feeling feverish and experiencing chills, some intermittent nonproductive coughing spells and also nausea/vomiting with isolated episode of diarrhea the day prior to admission.  Reports no eating anything outside the urinary no sick contacts.   Patient denies chest pain, urinary frequency, urgency, dysuria, focal weaknesses or any other complaints.   In the ED patient was found with significant high temperature in the 104.5 range, tachycardic, confused and is slightly tachypneic with a respiratory rate of 21.   Antipyretics, fluid resuscitation and empiric antibiotics after cultures taken were initiated.  TRH consulted to place patient in the hospital for further evaluation and management.   Of note, chest x-ray not demonstrating acute cardiopulmonary process.   Assessment and Plan: * Sepsis (HCC) -patient has met criteria for SIRS/Sepsis given elevated WBC's, fever, tachycardia, tachypnea and AMS. -Source of infection appears to be cellulitic/abscess right thigh. -Continue as needed analgesics -Continue current IV antibiotics. -will continue to follow cultures results -Appreciate assistance and recommendation by general surgery who has helped with wound care. -continue antipyretics and supportive care. -Patient advised to maintain adequate  hydration -follow clinical response.  -Continue holding Plavix in anticipation for potential wound care revision/I&D in the OR.  Abscess of right groin -Right groin/thigh abscess from previous lymph node biopsy -Appreciate general surgery recommendation -Follow culture results -Plavix have been placed on hold in case the surgical intervention is needed. -Continue current IV antibiotics.  AKI (acute kidney injury) (HCC) -in the setting of pre-renal azotemia most likely -Urinalysis not demonstrating UTI -Continue to maintain adequate hydration -Continue minimizing nephrotoxic agents -Creatinine adequately responding and improving. -Continue to follow trend.  DLBCL (diffuse large B cell lymphoma) (HCC) -continue outpatient follow up with oncology service. -case discussed with Dr. Elbert Ewings -Continue IV antibiotics until patient afebrile for 48 hours and follow culture results to adequately transition therapy to oral route.  Hypothyroidism -TSH within normal limits. -continue synthroid  History of CVA with residual deficit -no new focal deficit  -continue risk factor modification -continue secondary prevention once cleared by general surgery (chronically on Plavix).  Class 1 obesity with due to excess calories with serious comorbidity and body mass index (BMI) of 32.0 to 32.9 in adult -Body mass index is 34.12 kg/m. -low calorie diet and portion control discussed with patient  Essential hypertension, benign -stable and well controlled at the moment. -Continue current antihypertensive regimen  -follow vital signs trend -Heart healthy diet discussed with patient.  Mixed hyperlipidemia -continue statin/zetia. -Heart healthy diet discussed with patient.  Diastolic heart failure (HCC) -chronic and compensated -Low-sodium diet discussed with patient. -follow daily weight and strict intake and output  CORONARY ATHEROSCLEROSIS NATIVE CORONARY ARTERY -no CP -continue coreg,  statin/zetia, imdur and cozaar -continue telemetry monitoring while in stepdown   Type 2 diabetes mellitus with hyperglycemia and vascular disease (HCC) -A1c 8.5 -Continue sliding scale insulin and Semglee. -Follow CBGs fluctuation and further adjust hypoglycemic regimen as needed. -Modified carbohydrate diet discussed  with patient.  Hypomagnesemia -Electrolytes repleted -Continue to follow trend And further replete as needed.   Subjective:  Reports feeling much better, afebrile, good saturation on room air and reported improvement in his right thigh pain.  Physical Exam: Vitals:   07/12/22 0600 07/12/22 0732 07/12/22 1058 07/12/22 1611  BP: 129/63     Pulse: 84   81  Resp: (!) 21   12  Temp:  99.4 F (37.4 C) 98.8 F (37.1 C) 99.2 F (37.3 C)  TempSrc:  Oral Oral Oral  SpO2: 99%   95%  Weight:      Height:       General exam: Alert, awake, oriented x 3, no chest pain, no nausea, no vomiting.  Currently afebrile.  Patient denies shortness of breath and expressed improvement in erythema/induration and swelling on his right thigh. Respiratory system: Clear to auscultation. Respiratory effort normal.  Good saturation on room air. Cardiovascular system:RRR. No rubs or gallops. Gastrointestinal system: Abdomen is obese, nondistended, soft and nontender. No organomegaly or masses felt. Normal bowel sounds heard. Central nervous system: Alert and oriented. No focal neurological deficits. Extremities: No cyanosis or clubbing. Skin: No petechiae; right thigh demonstrated improvement in wound appearance; currently packed less erythema appreciated. Psychiatry: Judgement and insight appear normal. Mood & affect appropriate.   Data Reviewed: Basic metabolic panel: Sodium 133, potassium 4.1, chloride 102, bicarb 23, BUN 20, creatinine 1.26 and GFR > 60. CBC: WBCs 8.7, hemoglobin 9.0, platelet count 116 K Magnesium: 1.6  Family Communication: Wife at bedside.  Disposition: Status  is: Inpatient Remains inpatient appropriate because: Continue IV antibiotics.   Planned Discharge Destination: Home   Time spent: 50 minutes  Author: Vassie Loll, MD 07/12/2022 5:46 PM  For on call review [email or web address].

## 2022-07-13 DIAGNOSIS — D696 Thrombocytopenia, unspecified: Secondary | ICD-10-CM | POA: Diagnosis not present

## 2022-07-13 DIAGNOSIS — N289 Disorder of kidney and ureter, unspecified: Secondary | ICD-10-CM | POA: Diagnosis not present

## 2022-07-13 DIAGNOSIS — E1159 Type 2 diabetes mellitus with other circulatory complications: Secondary | ICD-10-CM | POA: Diagnosis not present

## 2022-07-13 DIAGNOSIS — A419 Sepsis, unspecified organism: Secondary | ICD-10-CM | POA: Diagnosis not present

## 2022-07-13 LAB — CBC
HCT: 28.5 % — ABNORMAL LOW (ref 39.0–52.0)
Hemoglobin: 9.2 g/dL — ABNORMAL LOW (ref 13.0–17.0)
MCH: 26.4 pg (ref 26.0–34.0)
MCHC: 32.3 g/dL (ref 30.0–36.0)
MCV: 81.7 fL (ref 80.0–100.0)
Platelets: 148 10*3/uL — ABNORMAL LOW (ref 150–400)
RBC: 3.49 MIL/uL — ABNORMAL LOW (ref 4.22–5.81)
RDW: 18.9 % — ABNORMAL HIGH (ref 11.5–15.5)
WBC: 11.4 10*3/uL — ABNORMAL HIGH (ref 4.0–10.5)
nRBC: 0 % (ref 0.0–0.2)

## 2022-07-13 LAB — MAGNESIUM: Magnesium: 1.9 mg/dL (ref 1.7–2.4)

## 2022-07-13 LAB — AEROBIC/ANAEROBIC CULTURE W GRAM STAIN (SURGICAL/DEEP WOUND)

## 2022-07-13 LAB — CULTURE, BLOOD (ROUTINE X 2): Special Requests: ADEQUATE

## 2022-07-13 LAB — BASIC METABOLIC PANEL
Anion gap: 8 (ref 5–15)
BUN: 17 mg/dL (ref 8–23)
CO2: 23 mmol/L (ref 22–32)
Calcium: 8.3 mg/dL — ABNORMAL LOW (ref 8.9–10.3)
Chloride: 105 mmol/L (ref 98–111)
Creatinine, Ser: 1.08 mg/dL (ref 0.61–1.24)
GFR, Estimated: >60 mL/min (ref >60)
Glucose, Bld: 274 mg/dL — ABNORMAL HIGH (ref 70–99)
Potassium: 3.9 mmol/L (ref 3.5–5.1)
Sodium: 136 mmol/L (ref 135–145)

## 2022-07-13 LAB — GLUCOSE, CAPILLARY
Glucose-Capillary: 245 mg/dL — ABNORMAL HIGH (ref 70–99)
Glucose-Capillary: 267 mg/dL — ABNORMAL HIGH (ref 70–99)
Glucose-Capillary: 347 mg/dL — ABNORMAL HIGH (ref 70–99)
Glucose-Capillary: 366 mg/dL — ABNORMAL HIGH (ref 70–99)

## 2022-07-13 MED ORDER — INSULIN GLARGINE-YFGN 100 UNIT/ML ~~LOC~~ SOLN
32.0000 [IU] | Freq: Every day | SUBCUTANEOUS | Status: DC
  Administered 2022-07-13: 32 [IU] via SUBCUTANEOUS
  Filled 2022-07-13 (×2): qty 0.32

## 2022-07-13 NOTE — Inpatient Diabetes Management (Signed)
Inpatient Diabetes Program Recommendations  AACE/ADA: New Consensus Statement on Inpatient Glycemic Control   Target Ranges:  Prepandial:   less than 140 mg/dL      Peak postprandial:   less than 180 mg/dL (1-2 hours)      Critically ill patients:  140 - 180 mg/dL    Latest Reference Range & Units 07/12/22 07:28 07/12/22 10:57 07/12/22 16:13 07/12/22 19:46 07/13/22 07:17  Glucose-Capillary 70 - 99 mg/dL 161 (H)  Novolog 5 units 325 (H)  Novolog 7 units 332 (H)  Novolog 7 units 363 (H)  Novolog 5 units  Semglee 25 units 245 (H)  Novolog 3 units   Review of Glycemic Control  Diabetes history: DM2 Outpatient Diabetes medications: Humulin R U500 70 units TID, Metformin 500 mg daily, Farxiga 5 mg daily Current orders for Inpatient glycemic control: Semglee 25 units QHS, Novolog 0-9 units TID with meals, Novolog 0-5 units QHS  Inpatient Diabetes Program Recommendations:    Insulin: Please consider increasing Semglee to 30 units QHS, Novolog correction to 0-15 units TID with meals, and ordering Novolog 5 units TID with meals for meal coverage if patient eats at least 50% of meals.  Thanks, Orlando Penner, RN, MSN, CDCES Diabetes Coordinator Inpatient Diabetes Program [phone] (Team Pager from 8am to 5pm)

## 2022-07-13 NOTE — Progress Notes (Signed)
[facility] Progress Note     Subjective: Patient seen and examined.  He is resting comfortably in bed.  He has had his outer dressing changed over top of his right abscess twice overnight.  The drainage has been documented as serosanguineous.  Patient denies any worsening pain in his right groin.  Denies fevers and chills.  Objective: Vital signs in last 24 hours: Temp:  [98.6 F (37 C)-99.7 F (37.6 C)] 99.2 F (37.3 C) (04/30 0716) Pulse Rate:  [70-83] 83 (04/30 0730) Resp:  [12-24] 20 (04/30 0730) BP: (131-162)/(53-78) 162/65 (04/30 0700) SpO2:  [95 %-100 %] 96 % (04/30 0730) Weight:  [126.4 kg] 126.4 kg (04/30 0357) Last BM Date : 07/11/22  Intake/Output from previous day: 04/29 0701 - 04/30 0700 In: 2086.7 [I.V.:1686.7; IV Piggyback:400] Out: 1300 [Urine:1300] Intake/Output this shift: Total I/O In: 146.7 [I.V.:146.7] Out: 300 [Urine:300]  General appearance: alert, cooperative, and no distress Skin: Right groin with persistent surrounding erythema, improving induration, abscess cavity with serosanguineous drainage  Lab Results:  Recent Labs    07/12/22 0507 07/13/22 0504  WBC 18.7* 11.4*  HGB 9.0* 9.2*  HCT 28.3* 28.5*  PLT 116* 148*   BMET Recent Labs    07/12/22 0507 07/13/22 0504  NA 133* 136  K 4.1 3.9  CL 102 105  CO2 23 23  GLUCOSE 314* 274*  BUN 20 17  CREATININE 1.26* 1.08  CALCIUM 8.1* 8.3*   PT/INR Recent Labs    07/11/22 0204  LABPROT 15.2  INR 1.2    Studies/Results: [place] EKG SITE RITE  Result Date: 07/11/2022 If Site Rite image not attached, placement could not be confirmed due to current cardiac rhythm.   Anti-infectives: Anti-infectives (From admission, onward)    Start     Dose/Rate Route Frequency Ordered Stop   07/11/22 1600  vancomycin (VANCOREADY) IVPB 1500 mg/300 mL        1,500 mg 150 mL/hr over 120 Minutes Intravenous Every 24 hours 07/11/22 1154     07/11/22 1400  ceFEPIme (MAXIPIME) 2 g in  sodium chloride 0.9 % 100 mL IVPB        2 g 200 mL/hr over 30 Minutes Intravenous Every 8 hours 07/11/22 1149     07/11/22 0615  vancomycin (VANCOCIN) IVPB 1000 mg/200 mL premix        1,000 mg 200 mL/hr over 60 Minutes Intravenous  Once 07/11/22 0603 07/11/22 0714   07/11/22 0615  metroNIDAZOLE (FLAGYL) IVPB 500 mg        500 mg 100 mL/hr over 60 Minutes Intravenous  Once 07/11/22 0603 07/11/22 0713   07/11/22 0615  ceFEPIme (MAXIPIME) 2 g in sodium chloride 0.9 % 100 mL IVPB        2 g 200 mL/hr over 30 Minutes Intravenous  Once 07/11/22 0603 07/11/22 1914       Assessment/Plan:  Patient is a 72 year old male who was admitted with fevers and right groin abscess.  He is status post incision and drainage of right groin abscess on 4/29.  -Gram stain with gram-positive cocci, await cultures -Patient currently on IV cefepime and vancomycin, antibiotics per hospitalist -Irrigated right groin abscess cavity and repacked with gauze.  No evidence of purulent drainage at this time.  -Given improving induration and no further purulent drainage, will hopefully be able to continue with nonoperative interventions for this abscess -Will place nursing orders for packing changes twice daily -Care per hospitalist   LOS: 2 days    Ambert Virrueta  A Sinclair Arrazola 07/13/2022

## 2022-07-13 NOTE — Progress Notes (Signed)
Progress Note   Patient: Alex Cortez ZOX:[id number] DOB: Jun 25, 1950 DOA: 07/11/2022     2 DOS: the patient was seen and examined on 07/13/2022   Brief hospital admission course:  Alex Cortez is a 72 y.o. male with medical history significant of lymphoma (actively receiving chemotherapy), hypertension, insulin-dependent diabetes, coronary artery disease, hyperlipidemia, history of stroke and hypothyroidism; who presented to the hospital secondary to generalized weakness, subjective fever, nausea/vomiting and decreased oral intake. Patient reports symptom has been present for the last 2 days or so and worsening.   Patient reports feeling feverish and experiencing chills, some intermittent nonproductive coughing spells and also nausea/vomiting with isolated episode of diarrhea the day prior to admission.  Reports no eating anything outside the urinary no sick contacts.   Patient denies chest pain, urinary frequency, urgency, dysuria, focal weaknesses or any other complaints.   In the ED patient was found with significant high temperature in the 104.5 range, tachycardic, confused and is slightly tachypneic with a respiratory rate of 21.   Antipyretics, fluid resuscitation and empiric antibiotics after cultures taken were initiated.  TRH consulted to place patient in the hospital for further evaluation and management.   Of note, chest x-ray not demonstrating acute cardiopulmonary process.   Assessment and Plan: * Sepsis (HCC) -patient has met criteria for SIRS/Sepsis given elevated WBC's, fever, tachycardia, tachypnea and AMS. -Source of infection appears to be cellulitic/abscess right thigh. -Continue as needed analgesics -Continue current IV antibiotics. -will continue to follow cultures results -Appreciate assistance and recommendation by general surgery who has helped with wound care. -continue antipyretics and supportive care. -Patient advised to maintain adequate  hydration -follow clinical response.  -Continue holding Plavix in anticipation for potential wound care revision/I&D in the OR.  Abscess of right groin -Right groin/thigh abscess from previous lymph node biopsy -Appreciate general surgery recommendation -Follow culture results -Plavix have been placed on hold in case the surgical intervention is needed. -Continue current IV antibiotics.  AKI (acute kidney injury) (HCC) -in the setting of pre-renal azotemia most likely -Urinalysis not demonstrating UTI -Continue to maintain adequate hydration -Continue minimizing nephrotoxic agents -Creatinine adequately responding and improving. -Continue to follow trend.  DLBCL (diffuse large B cell lymphoma) (HCC) -continue outpatient follow up with oncology service. -case discussed with Dr. Elbert Ewings -Continue IV antibiotics until patient afebrile for 48 hours and follow culture results to adequately transition therapy to oral route.  Hypothyroidism -TSH within normal limits. -continue synthroid  History of CVA with residual deficit -no new focal deficit  -continue risk factor modification -continue secondary prevention once cleared by general surgery (chronically on Plavix).  Class 1 obesity with due to excess calories with serious comorbidity and body mass index (BMI) of 32.0 to 32.9 in adult -Body mass index is 34.12 kg/m. -low calorie diet and portion control discussed with patient  Essential hypertension, benign -stable and well controlled at the moment. -Continue current antihypertensive regimen  -follow vital signs trend -Heart healthy diet discussed with patient.  Mixed hyperlipidemia -continue statin/zetia. -Heart healthy diet discussed with patient.  Diastolic heart failure (HCC) -chronic and compensated -Low-sodium diet discussed with patient. -follow daily weight and strict intake and output  CORONARY ATHEROSCLEROSIS NATIVE CORONARY ARTERY -no CP -continue coreg,  statin/zetia, imdur and cozaar -continue telemetry monitoring while in stepdown   Type 2 diabetes mellitus with hyperglycemia and vascular disease (HCC) -A1c 8.5 -Continue sliding scale insulin and Semglee. -Follow CBGs fluctuation and further adjust hypoglycemic regimen as needed. -Modified carbohydrate diet discussed  with patient.  Hypomagnesemia -Electrolyte repleted -Continue to follow trend And further replete as needed.   Subjective:  Reports feeling much better, afebrile, good saturation on room air and reported improvement in his right thigh pain.  Physical Exam: Vitals:   07/13/22 0500 07/13/22 0700 07/13/22 0716 07/13/22 0730  BP: (!) 149/78 (!) 162/65    Pulse: 81 76 79 83  Resp: 17 19 19 20   Temp:   99.2 F (37.3 C)   TempSrc:   Oral   SpO2: 99% 98% 99% 96%  Weight:      Height:      (Physical examination and subjective symptoms updated by mistake on addendum created for patient's note on 07/12/2022).  General exam: Alert, awake, oriented x 3, no chest pain, no nausea, no vomiting.  Currently afebrile.  Patient denies shortness of breath and expressed improvement in erythema/induration and swelling on his right thigh. Respiratory system: Clear to auscultation. Respiratory effort normal.  Good saturation on room air. Cardiovascular system:RRR. No rubs or gallops. Gastrointestinal system: Abdomen is obese, nondistended, soft and nontender. No organomegaly or masses felt. Normal bowel sounds heard. Central nervous system: Alert and oriented. No focal neurological deficits. Extremities: No cyanosis or clubbing. Skin: No petechiae; right thigh demonstrated improvement in wound appearance; currently packed less erythema appreciated. Psychiatry: Judgement and insight appear normal. Mood & affect appropriate.   Data Reviewed: Basic metabolic panel: Sodium 136, potassium 3.9, chloride 105, bicarb 23, BUN 17, creatinine 1.08 GFR> 60 CBC: WBCs 11.4, hemoglobin 9.2 and  platelet count 148 K Magnesium: 1.9  Family Communication: Wife at bedside.  Disposition: Status is: Inpatient Remains inpatient appropriate because: Continue IV antibiotics.  Planned Discharge Destination: Home  Time spent: 50 minutes  Author: Vassie Loll, MD 07/13/2022 10:04 AM  For on call review [email or web address].

## 2022-07-14 DIAGNOSIS — A419 Sepsis, unspecified organism: Secondary | ICD-10-CM | POA: Diagnosis not present

## 2022-07-14 DIAGNOSIS — R6520 Severe sepsis without septic shock: Secondary | ICD-10-CM | POA: Diagnosis not present

## 2022-07-14 DIAGNOSIS — G9341 Metabolic encephalopathy: Secondary | ICD-10-CM | POA: Diagnosis not present

## 2022-07-14 LAB — AEROBIC/ANAEROBIC CULTURE W GRAM STAIN (SURGICAL/DEEP WOUND)

## 2022-07-14 LAB — BASIC METABOLIC PANEL
Anion gap: 7 (ref 5–15)
BUN: 14 mg/dL (ref 8–23)
CO2: 23 mmol/L (ref 22–32)
Calcium: 8.4 mg/dL — ABNORMAL LOW (ref 8.9–10.3)
Chloride: 106 mmol/L (ref 98–111)
Creatinine, Ser: 1.05 mg/dL (ref 0.61–1.24)
GFR, Estimated: >60 mL/min (ref >60)
Glucose, Bld: 284 mg/dL — ABNORMAL HIGH (ref 70–99)
Potassium: 3.9 mmol/L (ref 3.5–5.1)
Sodium: 136 mmol/L (ref 135–145)

## 2022-07-14 LAB — GLUCOSE, CAPILLARY
Glucose-Capillary: 266 mg/dL — ABNORMAL HIGH (ref 70–99)
Glucose-Capillary: 298 mg/dL — ABNORMAL HIGH (ref 70–99)

## 2022-07-14 LAB — CULTURE, BLOOD (ROUTINE X 2): Special Requests: ADEQUATE

## 2022-07-14 LAB — MAGNESIUM: Magnesium: 1.8 mg/dL (ref 1.7–2.4)

## 2022-07-14 MED ORDER — INSULIN GLARGINE-YFGN 100 UNIT/ML ~~LOC~~ SOLN
35.0000 [IU] | Freq: Every day | SUBCUTANEOUS | Status: DC
  Filled 2022-07-14: qty 0.35

## 2022-07-14 MED ORDER — SULFAMETHOXAZOLE-TRIMETHOPRIM 800-160 MG PO TABS: 1.0000 | ORAL_TABLET | Freq: Two times a day (BID) | ORAL | 0 refills | Status: AC

## 2022-07-14 MED ORDER — HEPARIN SOD (PORK) LOCK FLUSH 100 UNIT/ML IV SOLN
500.0000 [IU] | Freq: Once | INTRAVENOUS | Status: DC
  Filled 2022-07-14: qty 5

## 2022-07-14 NOTE — Discharge Summary (Signed)
Physician Discharge Summary  Alex Cortez:[id number] DOB: August 25, 1950 DOA: 07/11/2022  PCP: Juliette Alcide, MD  Admit date: 07/11/2022  Discharge date: 07/14/2022  Admitted From:Home  Disposition:  Home  Recommendations for Outpatient Follow-up:  Follow up with PCP in 1-2 weeks Follow-up with general surgeon as recommended for wound care and patient will have home health wound services as arranged Continue on Bactrim as prescribed for 7 more days due to Staph aureus infection that is pansensitive Continue other home medications as prior  Home Health: Yes with RN  Equipment/Devices: None  Discharge Condition:Stable  CODE STATUS: Full  Diet recommendation: Heart Healthy/carb modified  Brief/Interim Summary: Alex Cortez is a 72 y.o. male with medical history significant of lymphoma (actively receiving chemotherapy), hypertension, insulin-dependent diabetes, coronary artery disease, hyperlipidemia, history of stroke and hypothyroidism; who presented to the hospital secondary to generalized weakness, subjective fever, nausea/vomiting and decreased oral intake. Patient reports symptom has been present for the last 2 days or so and worsening. He was admitted with sepsis, present on admission secondary to right groin cellulitis/abscess and underwent incision and drainage on 4/29 and was followed by general surgery.  Culture results demonstrated growth of Staph aureus that was pansensitive and he has been transition to oral antibiotics for 7 more days with Bactrim on discharge.  He was also noted to have some AKI which has subsequently resolved.  No other acute events or concerns noted.  He will follow-up as recommended outpatient.  Discharge Diagnoses:  Principal Problem:   Sepsis (HCC) Active Problems:   Type 2 diabetes mellitus with vascular disease (HCC)   CORONARY ATHEROSCLEROSIS NATIVE CORONARY ARTERY   Diastolic heart failure (HCC)   Mixed hyperlipidemia   Essential  hypertension, benign   Class 1 obesity due to excess calories with serious comorbidity and body mass index (BMI) of 32.0 to 32.9 in adult   History of CVA with residual deficit   Hypothyroidism   DLBCL (diffuse large B cell lymphoma) (HCC)   AKI (acute kidney injury) (HCC)   Abscess of right groin  Principal discharge diagnosis: Sepsis, POA secondary to right groin cellulitis/abscess.  Discharge Instructions  Discharge Instructions     Diet - low sodium heart healthy   Complete by: As directed    Discharge wound care:   Complete by: As directed    Change right groin abscess cavity packing twice daily. Pack with saline dampened piece of gauze and cover with 4 x 4 and Medipore tape   Increase activity slowly   Complete by: As directed       Allergies as of 07/14/2022       Reactions   Ace Inhibitors Swelling   Angioedema   Rituxan [rituximab] Other (See Comments)   Pt had sensitivity reaction to Rituxan. See progress note from 05/14/22. Pt able to complete infusion.    Shrimp [shellfish Allergy] Hives, Swelling        Medication List     TAKE these medications    allopurinol 300 MG tablet Commonly known as: ZYLOPRIM Take 1 tablet (300 mg total) by mouth daily.   amLODipine 10 MG tablet Commonly known as: NORVASC Take 10 mg by mouth daily.   carvedilol 25 MG tablet Commonly known as: COREG Take 1 tablet (25 mg total) by mouth 2 (two) times daily.   clopidogrel 75 MG tablet Commonly known as: PLAVIX Take 1 tablet (75 mg total) by mouth daily.   ezetimibe 10 MG tablet Commonly known as: ZETIA TAKE  1 TABLET BY MOUTH EVERY DAY   Farxiga 5 MG Tabs tablet Generic drug: dapagliflozin propanediol TAKE 1 TABLET BY MOUTH EVERY DAY BEFORE BREAKFAST What changed: See the new instructions.   fluticasone 50 MCG/ACT nasal spray Commonly known as: FLONASE Place 2 sprays into both nostrils daily as needed for allergies.   FreeStyle Libre 2 Reader Hardie Pulley As directed    [employer] 2 Sensor Misc APPLY 1 SENSOR EVERY 14    DAYS   HumuLIN R U-500 KwikPen 500 UNIT/ML KwikPen Generic drug: insulin regular human CONCENTRATED INJECT AS DIRECTED 70 UNITS WITH BREAKFAST, 70 UNITS WITH LUNCH, AND 70 UNITS WITH SUPPER FOR PRE-MEAL BLOOD GLUCOSE READINGS OF 90MG /DL OR ABOVE What changed:  how much to take how to take this when to take this additional instructions   isosorbide mononitrate 30 MG 24 hr tablet Commonly known as: IMDUR TAKE 1 TABLET BY MOUTH EVERY DAY   levothyroxine 75 MCG tablet Commonly known as: SYNTHROID TAKE 1 TABLET BY MOUTH EVERY DAY BEFORE BREAKFAST What changed: See the new instructions.   lidocaine-prilocaine cream Commonly known as: EMLA Apply 1 Application topically as needed. What changed:  when to take this reasons to take this   losartan 50 MG tablet Commonly known as: COZAAR Take 1 tablet (50 mg total) by mouth daily.   metFORMIN 500 MG tablet Commonly known as: GLUCOPHAGE Take 1 tablet (500 mg total) by mouth daily with breakfast.   nitroGLYCERIN 0.4 MG SL tablet Commonly known as: Nitrostat Place 1 tablet (0.4 mg total) under the tongue every 5 (five) minutes as needed.   ondansetron 8 MG tablet Commonly known as: ZOFRAN Take 1 tablet (8 mg total) by mouth every 8 (eight) hours as needed. What changed: reasons to take this   ONE TOUCH ULTRA TEST test strip Generic drug: glucose blood USE TO TEST BLOOD SUGAR FOUR TIMES DAILY E11.65   predniSONE 20 MG tablet Commonly known as: DELTASONE Take 60 mg by mouth See admin instructions. 60 mg once daily for 5 days, starting on day 1 of chemotherapy. Repeat with each chemotherapy cycle.   predniSONE 10 MG tablet Commonly known as: DELTASONE Take 6 tablets (60 mg total) by mouth daily with breakfast. Take 6 tablets on Day 1 of chemotherapy and continue for 5 days. Repeat with each chemotherapy cycle.   prochlorperazine 10 MG tablet Commonly known as:  COMPAZINE Take 1 tablet (10 mg total) by mouth every 6 (six) hours as needed for nausea or vomiting.   rosuvastatin 40 MG tablet Commonly known as: CRESTOR TAKE 1 TABLET BY MOUTH EVERY DAY   sulfamethoxazole-trimethoprim 800-160 MG tablet Commonly known as: BACTRIM DS Take 1 tablet by mouth 2 (two) times daily for 7 days.               Discharge Care Instructions  (From admission, onward)           Start     Ordered   07/14/22 0000  Discharge wound care:       Comments: Change right groin abscess cavity packing twice daily. Pack with saline dampened piece of gauze and cover with 4 x 4 and Medipore tape   07/14/22 1303            Follow-up Information     Fritzi Mandes, MD. Call.   Specialty: General Surgery Why: Call to schedule a follow up appointment in 1 week for postoperative abscess Contact information: [address] [place] [address]  [phone]         Pappayliou, Gustavus Messing, DO. Call.   Specialty: General Surgery Why: As needed Contact information: [address] [facility] [address] [phone]         Juliette Alcide, MD. Go in 1 week(s).   Specialty: Family Medicine Contact information: [address] [place] [address] [phone]                Allergies  Allergen Reactions   Ace Inhibitors Swelling    Angioedema   Rituxan [Rituximab] Other (See Comments)    Pt had sensitivity reaction to Rituxan. See progress note from 05/14/22. Pt able to complete infusion.    Shrimp [Shellfish Allergy] Hives and Swelling    Consultations: General surgery   Procedures/Studies: [place] EKG SITE RITE  Result Date: 07/11/2022 If Site Rite image not attached, placement could not be confirmed due to current cardiac rhythm.  DG Chest 2 View  Result Date: 07/11/2022 CLINICAL DATA:  Possible sepsis EXAM: CHEST - 2 VIEW COMPARISON:  02/13/2022 FINDINGS: Cardiac shadow is enlarged but stable. Right chest wall port is  noted in satisfactory position. Lungs are well aerated bilaterally. No focal infiltrate or effusion is seen. No bony abnormality is noted. IMPRESSION: No active cardiopulmonary disease. Electronically Signed   By: Alcide Clever M.D.   On: 07/11/2022 02:37     Discharge Exam: Vitals:   07/14/22 0700 07/14/22 0807  BP: (!) 140/70 (!) 144/67  Pulse: 77 69  Resp: 18 17  Temp: 98.4 F (36.9 C)   SpO2: 100% 100%   Vitals:   07/14/22 0009 07/14/22 0456 07/14/22 0700 07/14/22 0807  BP: 135/73 (!) 147/71 (!) 140/70 (!) 144/67  Pulse: 72 73 77 69  Resp: 19 20 18 17   Temp: 98.2 F (36.8 C) 98.2 F (36.8 C) 98.4 F (36.9 C)   TempSrc: Oral Oral Oral   SpO2: 100% 100% 100% 100%  Weight:      Height:        General: Pt is alert, awake, not in acute distress Cardiovascular: RRR, S1/S2 +, no rubs, no gallops Respiratory: CTA bilaterally, no wheezing, no rhonchi Abdominal: Soft, NT, ND, bowel sounds + Extremities: no edema, no cyanosis    The results of significant diagnostics from this hospitalization (including imaging, microbiology, ancillary and laboratory) are listed below for reference.     Microbiology: Recent Results (from the past 240 hour(s))  Resp panel by RT-PCR (RSV, Flu A&B, Covid) Anterior Nasal Swab     Status: None   Collection Time: 07/11/22  2:03 AM   Specimen: Anterior Nasal Swab  Result Value Ref Range Status   SARS Coronavirus 2 by RT PCR NEGATIVE NEGATIVE Final    Comment: (NOTE) SARS-CoV-2 target nucleic acids are NOT DETECTED.  The SARS-CoV-2 RNA is generally detectable in upper respiratory specimens during the acute phase of infection. The lowest concentration of SARS-CoV-2 viral copies this assay can detect is 138 copies/mL. A negative result does not preclude SARS-Cov-2 infection and should not be used as the sole basis for treatment or other patient management decisions. A negative result may occur with  improper specimen collection/handling,  submission of specimen other than nasopharyngeal swab, presence of viral mutation(s) within the areas targeted by this assay, and inadequate number of viral copies(<138 copies/mL). A negative result must be combined with clinical observations, patient history, and epidemiological information. The expected result is Negative.  Fact Sheet for Patients:  BloggerCourse.com  Fact Sheet for Healthcare  Providers:  SeriousBroker.it  This test is no t yet approved or cleared by the [place] and  has been authorized for detection and/or diagnosis of SARS-CoV-2 by FDA under an Emergency Use Authorization (EUA). This EUA will remain  in effect (meaning this test can be used) for the duration of the COVID-19 declaration under Section 564(b)(1) of the Act, 21 U.S.C.section 360bbb-3(b)(1), unless the authorization is terminated  or revoked sooner.       Influenza A by PCR NEGATIVE NEGATIVE Final   Influenza B by PCR NEGATIVE NEGATIVE Final    Comment: (NOTE) The Xpert Xpress SARS-CoV-2/FLU/RSV plus assay is intended as an aid in the diagnosis of influenza from Nasopharyngeal swab specimens and should not be used as a sole basis for treatment. Nasal washings and aspirates are unacceptable for Xpert Xpress SARS-CoV-2/FLU/RSV testing.  Fact Sheet for Patients: BloggerCourse.com  Fact Sheet for Healthcare Providers: SeriousBroker.it  This test is not yet approved or cleared by the [place] FDA and has been authorized for detection and/or diagnosis of SARS-CoV-2 by FDA under an Emergency Use Authorization (EUA). This EUA will remain in effect (meaning this test can be used) for the duration of the COVID-19 declaration under Section 564(b)(1) of the Act, 21 U.S.C. section 360bbb-3(b)(1), unless the authorization is terminated or revoked.     Resp Syncytial Virus by PCR NEGATIVE  NEGATIVE Final    Comment: (NOTE) Fact Sheet for Patients: BloggerCourse.com  Fact Sheet for Healthcare Providers: SeriousBroker.it  This test is not yet approved or cleared by the [place] FDA and has been authorized for detection and/or diagnosis of SARS-CoV-2 by FDA under an Emergency Use Authorization (EUA). This EUA will remain in effect (meaning this test can be used) for the duration of the COVID-19 declaration under Section 564(b)(1) of the Act, 21 U.S.C. section 360bbb-3(b)(1), unless the authorization is terminated or revoked.  Performed at [facility], [address], [place], [place] [address]   Culture, blood (Routine x 2)     Status: None (Preliminary result)   Collection Time: 07/11/22  2:04 AM   Specimen: Site Not Specified; Blood  Result Value Ref Range Status   Specimen Description   Final    SITE NOT SPECIFIED BOTTLES DRAWN AEROBIC AND ANAEROBIC   Special Requests Blood Culture adequate volume  Final   Culture   Final    NO GROWTH 3 DAYS Performed at  [facility], [address] [place], [place] [address]    Report Status PENDING  Incomplete  Culture, blood (Routine x 2)     Status: None (Preliminary result)   Collection Time: 07/11/22  2:09 AM   Specimen: Left Antecubital; Blood  Result Value Ref Range Status   Specimen Description   Final    LEFT ANTECUBITAL BOTTLES DRAWN AEROBIC AND ANAEROBIC   Special Requests Blood Culture adequate volume  Final   Culture   Final    NO GROWTH 3 DAYS Performed at [facility], [address], [place], [place] [address]    Report Status PENDING  Incomplete  Aerobic/Anaerobic Culture w Gram Stain (surgical/deep wound)     Status: None (Preliminary result)   Collection Time: 07/12/22 12:57 PM   Specimen: Abscess  Result Value Ref Range Status   Specimen Description   Final    ABSCESS groin,right Performed at [facility], [address] [place], [place]  [address]    Special Requests   Final    Immunocompromised Performed at [facility], [address], [place], [place] [address]    Gram Stain   Final    FEW WBC PRESENT, PREDOMINANTLY PMN FEW GRAM POSITIVE COCCI Performed at [facility] [place] [facility], [address], [place], [place] [address]    Culture   Final    ABUNDANT STAPHYLOCOCCUS AUREUS NO ANAEROBES ISOLATED; CULTURE IN PROGRESS FOR 5 DAYS    Report Status PENDING  Incomplete   Organism ID, Bacteria STAPHYLOCOCCUS AUREUS  Final      Susceptibility   Staphylococcus aureus - MIC*    CIPROFLOXACIN <=0.5 SENSITIVE Sensitive     ERYTHROMYCIN <=0.25 SENSITIVE Sensitive     GENTAMICIN <=0.5 SENSITIVE Sensitive     OXACILLIN 0.5 SENSITIVE Sensitive     TETRACYCLINE <=1 SENSITIVE Sensitive     VANCOMYCIN 1 SENSITIVE Sensitive     TRIMETH/SULFA <=10 SENSITIVE Sensitive     CLINDAMYCIN <=0.25 SENSITIVE Sensitive     RIFAMPIN <=0.5 SENSITIVE Sensitive     Inducible Clindamycin NEGATIVE Sensitive     LINEZOLID 2 SENSITIVE Sensitive     * ABUNDANT STAPHYLOCOCCUS AUREUS  MRSA Next Gen by PCR, Nasal     Status: None   Collection Time: 07/12/22  1:18 PM   Specimen: Nasal Mucosa; Nasal Swab  Result Value Ref Range Status   MRSA by PCR Next Gen NOT DETECTED NOT DETECTED Final    Comment: (NOTE) The GeneXpert MRSA Assay (FDA approved for NASAL specimens only), is one component of a comprehensive MRSA colonization surveillance program. It is not intended to diagnose MRSA infection nor to guide or monitor treatment for MRSA infections. Test performance is not FDA approved in patients less than 24 years old. Performed at [facility], [address] [place], [place] [address]      Labs: BNP (last 3 results) No results for input(s): "BNP" in the last 8760 hours. Basic Metabolic Panel: Recent Labs  Lab 07/11/22 0204 07/11/22 0736 07/12/22 0507 07/13/22 0504 07/14/22 0451  NA 135  --  133* 136 136  K 3.9  --  4.1 3.9 3.9  CL 104  --   102 105 106  CO2 23  --  23 23 23   GLUCOSE 112*  --  314* 274* 284*  BUN 16  --  20 17 14   CREATININE 1.35*  --  1.26* 1.08 1.05  CALCIUM 8.6*  --  8.1* 8.3* 8.4*  MG  --  1.5* 1.6* 1.9 1.8  PHOS  --  1.3* 3.0  --   --    Liver Function Tests: Recent Labs  Lab 07/11/22 0204  AST 28  ALT 35  ALKPHOS 64  BILITOT 0.8  PROT 6.9  ALBUMIN 3.5   No results for input(s): "LIPASE", "AMYLASE" in the last 168 hours. No results for input(s): "AMMONIA" in the last 168 hours. CBC: Recent Labs  Lab 07/11/22 0204 07/12/22 0507 07/13/22 0504  WBC 14.3* 18.7* 11.4*  NEUTROABS 11.0*  --   --   HGB 10.1* 9.0* 9.2*  HCT 31.6* 28.3* 28.5*  MCV 80.8 81.6 81.7  PLT 143* 116* 148*   Cardiac Enzymes: No results for input(s): "CKTOTAL", "CKMB", "CKMBINDEX", "TROPONINI" in the last 168 hours. BNP: Invalid input(s): "POCBNP" CBG: Recent Labs  Lab 07/13/22 1106 07/13/22 1622 07/13/22 2142 07/14/22 0732 07/14/22 1127  GLUCAP 267* 347* 366* 266* 298*   D-Dimer No results for input(s): "DDIMER" in the last 72 hours. Hgb A1c No results for input(s): "HGBA1C" in the last 72 hours. Lipid Profile No results for input(s): "CHOL", "HDL", "  LDLCALC", "TRIG", "CHOLHDL", "LDLDIRECT" in the last 72 hours. Thyroid function studies No results for input(s): "TSH", "T4TOTAL", "T3FREE", "THYROIDAB" in the last 72 hours.  Invalid input(s): "FREET3" Anemia work up No results for input(s): "VITAMINB12", "FOLATE", "FERRITIN", "TIBC", "IRON", "RETICCTPCT" in the last 72 hours. Urinalysis    Component Value Date/Time   COLORURINE AMBER (A) 07/11/2022 0555   APPEARANCEUR CLEAR 07/11/2022 0555   LABSPEC 1.019 07/11/2022 0555   PHURINE 5.0 07/11/2022 0555   GLUCOSEU NEGATIVE 07/11/2022 0555   HGBUR SMALL (A) 07/11/2022 0555   BILIRUBINUR NEGATIVE 07/11/2022 0555   KETONESUR NEGATIVE 07/11/2022 0555   PROTEINUR 100 (A) 07/11/2022 0555   NITRITE NEGATIVE 07/11/2022 0555   LEUKOCYTESUR NEGATIVE  07/11/2022 0555   Sepsis Labs Recent Labs  Lab 07/11/22 0204 07/12/22 0507 07/13/22 0504  WBC 14.3* 18.7* 11.4*   Microbiology Recent Results (from the past 240 hour(s))  Resp panel by RT-PCR (RSV, Flu A&B, Covid) Anterior Nasal Swab     Status: None   Collection Time: 07/11/22  2:03 AM   Specimen: Anterior Nasal Swab  Result Value Ref Range Status   SARS Coronavirus 2 by RT PCR NEGATIVE NEGATIVE Final    Comment: (NOTE) SARS-CoV-2 target nucleic acids are NOT DETECTED.  The SARS-CoV-2 RNA is generally detectable in upper respiratory specimens during the acute phase of infection. The lowest concentration of SARS-CoV-2 viral copies this assay can detect is 138 copies/mL. A negative result does not preclude SARS-Cov-2 infection and should not be used as the sole basis for treatment or other patient management decisions. A negative result may occur with  improper specimen collection/handling, submission of specimen other than nasopharyngeal swab, presence of viral mutation(s) within the areas targeted by this assay, and inadequate number of viral copies(<138 copies/mL). A negative result must be combined with clinical observations, patient history, and epidemiological information. The expected result is Negative.  Fact Sheet for Patients:  BloggerCourse.com  Fact Sheet for Healthcare Providers:  SeriousBroker.it  This test is no t yet approved or cleared by the [place] FDA and  has been authorized for detection and/or diagnosis of SARS-CoV-2 by FDA under an Emergency Use Authorization (EUA). This EUA will remain  in effect (meaning this test can be used) for the duration of the COVID-19 declaration under Section 564(b)(1) of the Act, 21 U.S.C.section 360bbb-3(b)(1), unless the authorization is terminated  or revoked sooner.       Influenza A by PCR NEGATIVE NEGATIVE Final   Influenza B by PCR NEGATIVE NEGATIVE Final     Comment: (NOTE) The Xpert Xpress SARS-CoV-2/FLU/RSV plus assay is intended as an aid in the diagnosis of influenza from Nasopharyngeal swab specimens and should not be used as a sole basis for treatment. Nasal washings and aspirates are unacceptable for Xpert Xpress SARS-CoV-2/FLU/RSV testing.  Fact Sheet for Patients: BloggerCourse.com  Fact Sheet for Healthcare Providers: SeriousBroker.it  This test is not yet approved or cleared by the [place] FDA and has been authorized for detection and/or diagnosis of SARS-CoV-2 by FDA under an Emergency Use Authorization (EUA). This EUA will remain in effect (meaning this test can be used) for the duration of the COVID-19 declaration under Section 564(b)(1) of the Act, 21 U.S.C. section 360bbb-3(b)(1), unless the authorization is terminated or revoked.     Resp Syncytial Virus by PCR NEGATIVE NEGATIVE Final    Comment: (NOTE) Fact Sheet for Patients: BloggerCourse.com  Fact Sheet for Healthcare Providers: SeriousBroker.it  This test is not yet approved or cleared by  the [employer] and has been authorized for detection and/or diagnosis of SARS-CoV-2 by FDA under an Emergency Use Authorization (EUA). This EUA will remain in effect (meaning this test can be used) for the duration of the COVID-19 declaration under Section 564(b)(1) of the Act, 21 U.S.C. section 360bbb-3(b)(1), unless the authorization is terminated or revoked.  Performed at [facility], [address], [place], [place] [address]   Culture, blood (Routine x 2)     Status: None (Preliminary result)   Collection Time: 07/11/22  2:04 AM   Specimen: Site Not Specified; Blood  Result Value Ref Range Status   Specimen Description   Final    SITE NOT SPECIFIED BOTTLES DRAWN AEROBIC AND ANAEROBIC   Special Requests Blood Culture adequate volume  Final   Culture    Final    NO GROWTH 3 DAYS Performed at [facility], [address] [place], [place] [address]    Report Status PENDING  Incomplete  Culture, blood (Routine x 2)     Status: None (Preliminary result)   Collection Time: 07/11/22  2:09 AM   Specimen: Left Antecubital; Blood  Result Value Ref Range Status   Specimen Description   Final    LEFT ANTECUBITAL BOTTLES DRAWN AEROBIC AND ANAEROBIC   Special Requests Blood Culture adequate volume  Final   Culture   Final    NO GROWTH 3 DAYS Performed at [facility], [address], [place], [place] [address]    Report Status PENDING  Incomplete  Aerobic/Anaerobic Culture w Gram Stain (surgical/deep wound)     Status: None (Preliminary result)   Collection Time: 07/12/22 12:57 PM   Specimen: Abscess  Result Value Ref Range Status   Specimen Description   Final    ABSCESS groin,right Performed at [facility], [address], [place], [place] [address]    Special Requests   Final    Immunocompromised Performed at [facility], [address], [place], [place] [address]    Gram Stain   Final    FEW WBC PRESENT, PREDOMINANTLY PMN FEW GRAM POSITIVE COCCI Performed at [facility], [address], [place], [place] [address]    Culture   Final    ABUNDANT STAPHYLOCOCCUS AUREUS NO ANAEROBES ISOLATED; CULTURE IN PROGRESS FOR 5 DAYS    Report Status PENDING  Incomplete   Organism ID, Bacteria STAPHYLOCOCCUS AUREUS  Final      Susceptibility   Staphylococcus aureus - MIC*    CIPROFLOXACIN <=0.5 SENSITIVE Sensitive     ERYTHROMYCIN <=0.25 SENSITIVE Sensitive     GENTAMICIN <=0.5 SENSITIVE Sensitive     OXACILLIN 0.5 SENSITIVE Sensitive     TETRACYCLINE <=1 SENSITIVE Sensitive     VANCOMYCIN 1 SENSITIVE Sensitive     TRIMETH/SULFA <=10 SENSITIVE Sensitive     CLINDAMYCIN <=0.25 SENSITIVE Sensitive     RIFAMPIN <=0.5 SENSITIVE Sensitive     Inducible Clindamycin NEGATIVE Sensitive     LINEZOLID 2 SENSITIVE Sensitive     * ABUNDANT  STAPHYLOCOCCUS AUREUS  MRSA Next Gen by PCR, Nasal     Status: None   Collection Time: 07/12/22  1:18 PM   Specimen: Nasal Mucosa; Nasal Swab  Result Value Ref Range Status   MRSA by PCR Next Gen NOT DETECTED NOT DETECTED Final    Comment: (NOTE) The GeneXpert MRSA Assay (FDA approved for NASAL specimens only), is one component of a comprehensive MRSA colonization surveillance program. It is not intended to diagnose MRSA infection nor to  guide or monitor treatment for MRSA infections. Test performance is not FDA approved in patients less than 6 years old. Performed at [facility], [address] [place], [place] [address]      Time coordinating discharge: 35 minutes  SIGNED:   Erick Blinks, DO [facility] 07/14/2022, 1:03 PM  If 7PM-7AM, please contact night-coverage [email or web address]

## 2022-07-14 NOTE — Inpatient Diabetes Management (Signed)
Inpatient Diabetes Program Recommendations  AACE/ADA: New Consensus Statement on Inpatient Glycemic Control   Target Ranges:  Prepandial:   less than 140 mg/dL      Peak postprandial:   less than 180 mg/dL (1-2 hours)      Critically ill patients:  140 - 180 mg/dL    Latest Reference Range & Units 07/13/22 07:17 07/13/22 11:06 07/13/22 16:22 07/13/22 21:42 07/14/22 07:32  Glucose-Capillary 70 - 99 mg/dL 098 (H) 119 (H) 147 (H) 366 (H) 266 (H)    Review of Glycemic Control  Diabetes history: DM2 Outpatient Diabetes medications: Humulin R U500 70 units TID, Metformin 500 mg daily, Farxiga 5 mg daily Current orders for Inpatient glycemic control: Semglee 32 units QHS, Novolog 0-9 units TID with meals, Novolog 0-5 units QHS   Inpatient Diabetes Program Recommendations:     Insulin: Please consider increasing Semglee to 35 units QHS, Novolog correction to 0-15 units TID with meals, and ordering Novolog 5 units TID with meals for meal coverage if patient eats at least 50% of meals.   Thanks, Orlando Penner, RN, MSN, CDCES Diabetes Coordinator Inpatient Diabetes Program [phone] (Team Pager from 8am to 5pm)

## 2022-07-14 NOTE — TOC Transition Note (Signed)
Transition of Care [facility]) - CM/SW Discharge Note   Patient Details  Name: Alex Cortez MRN: [id number] Date of Birth: 10-30-1950  [facility]) CM/SW Contact:  Villa Herb, LCSWA Phone Number: 07/14/2022, 12:05 PM   Clinical Narrative:    CSW updated by MD that pt will likely need [facility] RN for wound care when he D/C. CSW spoke with pt who confirms he would like this to be set up, no agency preference. CSW spoke to [place] with [facility] who confirms they can accept referral for [facility] RN. MD placed HH orders. TOC signing off.   Final next level of care: Home w Home Health Services Barriers to Discharge: Barriers Resolved   Patient Goals and CMS Choice CMS Medicare.gov Compare Post Acute Care list provided to:: Patient Choice offered to / list presented to : Patient  Discharge Placement                         Discharge Plan and Services Additional resources added to the After Visit Summary for                            [facility] Arranged: RN [facility] Agency: [facility] Date [facility] Agency Contacted: 07/14/22   Representative spoke with at [facility] Agency: Clifton Custard  Social Determinants of Health (SDOH) Interventions SDOH Screenings   Food Insecurity: No Food Insecurity (07/11/2022)  Housing: Low Risk  (07/11/2022)  Transportation Needs: No Transportation Needs (07/11/2022)  Utilities: Not At Risk (07/11/2022)  Depression (PHQ2-9): Low Risk  (04/07/2022)  Financial Resource Strain: Low Risk  (06/24/2022)  Physical Activity: Inactive (05/24/2022)  Social Connections: Unknown (07/31/2017)  Stress: No Stress Concern Present (04/07/2022)  Tobacco Use: Medium Risk (07/11/2022)     Readmission Risk Interventions    07/14/2022   12:04 PM  Readmission Risk Prevention Plan  Transportation Screening Complete  HRI or Home Care Consult Complete  Social Work Consult for Recovery Care Planning/Counseling Complete  Palliative Care Screening Not Applicable  Medication Review Special educational needs teacher) Complete

## 2022-07-14 NOTE — Progress Notes (Signed)
[facility] Progress Note     Subjective: Patient seen and examined.  He is sitting comfortably in chair at the side of the bed.  He denies any pain from his right groin abscess site.  He does note a fair amount of serous drainage.  He denies any fevers or chills.  He has no other complaints.  Objective: Vital signs in last 24 hours: Temp:  [98.2 F (36.8 C)-98.4 F (36.9 C)] 98.4 F (36.9 C) (05/01 0700) Pulse Rate:  [69-77] 69 (05/01 0807) Resp:  [17-20] 17 (05/01 0807) BP: (130-147)/(60-73) 144/67 (05/01 0807) SpO2:  [97 %-100 %] 100 % (05/01 0807) Last BM Date : 07/13/22  Intake/Output from previous day: 04/30 0701 - 05/01 0700 In: 864.4 [I.V.:364.4; IV Piggyback:500] Out: 600 [Urine:600] Intake/Output this shift: Total I/O In: 100 [IV Piggyback:100] Out: -   General appearance: alert, cooperative, and no distress Skin: Right groin abscess cavity with packing in place, serous drainage, improving erythema and induration  Lab Results:  Recent Labs    07/12/22 0507 07/13/22 0504  WBC 18.7* 11.4*  HGB 9.0* 9.2*  HCT 28.3* 28.5*  PLT 116* 148*   BMET Recent Labs    07/13/22 0504 07/14/22 0451  NA 136 136  K 3.9 3.9  CL 105 106  CO2 23 23  GLUCOSE 274* 284*  BUN 17 14  CREATININE 1.08 1.05  CALCIUM 8.3* 8.4*   PT/INR No results for input(s): "LABPROT", "INR" in the last 72 hours.  Studies/Results: No results found.  Anti-infectives: Anti-infectives (From admission, onward)    Start     Dose/Rate Route Frequency Ordered Stop   07/11/22 1600  vancomycin (VANCOREADY) IVPB 1500 mg/300 mL        1,500 mg 150 mL/hr over 120 Minutes Intravenous Every 24 hours 07/11/22 1154     07/11/22 1400  ceFEPIme (MAXIPIME) 2 g in sodium chloride 0.9 % 100 mL IVPB        2 g 200 mL/hr over 30 Minutes Intravenous Every 8 hours 07/11/22 1149     07/11/22 0615  vancomycin (VANCOCIN) IVPB 1000 mg/200 mL premix        1,000 mg 200 mL/hr over 60 Minutes  Intravenous  Once 07/11/22 0603 07/11/22 0714   07/11/22 0615  metroNIDAZOLE (FLAGYL) IVPB 500 mg        500 mg 100 mL/hr over 60 Minutes Intravenous  Once 07/11/22 0603 07/11/22 0713   07/11/22 0615  ceFEPIme (MAXIPIME) 2 g in sodium chloride 0.9 % 100 mL IVPB        2 g 200 mL/hr over 30 Minutes Intravenous  Once 07/11/22 0603 07/11/22 1610       Assessment/Plan:  Patient is a 72 year old male who was admitted with fevers and right groin abscess.  He is status post incision and drainage of right groin abscess on 4/29.   -Gram stain with gram-positive cocci, cultures positive for pansensitive Staph aureus -Right groin abscess packing changed.  No evidence of purulent drainage at this time.  -Patient stable for discharge from general surgery standpoint -Recommend that he follows up with Dr. Freida Busman, his original surgeon -Wound care instructions placed in the chart -Recommend discharge home on Bactrim for 7 days   LOS: 3 days    Addalynn Kumari A Ellasyn Swilling 07/14/2022

## 2022-07-14 NOTE — Discharge Instructions (Signed)
Wound care instructions: -Change her dressing daily or more frequently as needed for saturation -Remove packing prior to showering -May allow soapy water to run over incision -Pat dry once done and shower -Pack the abscess cavity with saline dampened gauze -Cover with gauze and ABD pad -May change the outer gauze/pad as needed for saturation -Call Dr. Freida Busman or Dr. Sol Passer office if he begins to have increasing redness, pain, or purulent drainage from the cavity

## 2022-07-14 NOTE — Consult Note (Signed)
   [facility] Inpatient Consult   07/14/2022  RAAD CLAYSON 01/18/1951 [id number]  Primary Care Provider:   Dr. Leandrew Koyanagi [facility]).  Patient is currently active with [facility] Care Management for chronic disease management services.  Patient has been engaged by a [facility] care coordinator.  Our community based plan of care has focused on disease management and community resource support.  Spoke with wife who was receptive to a post hospital follow up call or ongoing care coordination services.  Patient will receive a post hospital call and will be evaluated for assessments and disease process education.   Plan: Discharged home with [facility]  Inpatient Transition Of Care [TOC] team member to make aware that [facility] Care Management following.  Of note, [facility] Care Management services does not replace or interfere with any services that are needed or arranged by inpatient [facility] care management team.   For additional questions or referrals please contact:    Elliot Cousin, RN, BSN [facility] Liaison [place]   [facility]  Population Health Office Hours MTWF 8:00 am to 6 pm off on Thursday [phone] mobile [phone] [Office toll free line][facility] Office Hours are M-F 8:30 - 5 pm 24 hour nurse advise line [phone] Conceirge  [email or web address]

## 2022-07-15 ENCOUNTER — Encounter (HOSPITAL_COMMUNITY)
Admission: RE | Admit: 2022-07-15 | Discharge: 2022-07-15 | Disposition: A | Discharge status: Home / Self Care | Payer: Medicare Other | Source: Ambulatory Visit | Attending: Physician Assistant | Admitting: Physician Assistant

## 2022-07-15 ENCOUNTER — Telehealth: Payer: Self-pay | Admitting: *Deleted

## 2022-07-15 ENCOUNTER — Other Ambulatory Visit: Payer: Medicare Other

## 2022-07-15 ENCOUNTER — Inpatient Hospital Stay (HOSPITAL_COMMUNITY)
Admission: RE | Admit: 2022-07-15 | Discharge: 2022-07-15 | Disposition: A | Discharge status: Home / Self Care | Payer: Medicare Other | Source: Ambulatory Visit | Attending: Physician Assistant | Admitting: Physician Assistant

## 2022-07-15 DIAGNOSIS — C8338 Diffuse large B-cell lymphoma, lymph nodes of multiple sites: Secondary | ICD-10-CM | POA: Insufficient documentation

## 2022-07-15 LAB — CULTURE, BLOOD (ROUTINE X 2): Culture: NO GROWTH

## 2022-07-15 MED FILL — Dexamethasone Sodium Phosphate Inj 100 MG/10ML: INTRAMUSCULAR | Qty: 1 | Status: AC

## 2022-07-15 MED FILL — Fosaprepitant Dimeglumine For IV Infusion 150 MG (Base Eq): INTRAVENOUS | Qty: 5 | Status: AC

## 2022-07-15 NOTE — Telephone Encounter (Signed)
Received vm message from pt's wife, Maureen Ralphs. She states that her husband was discharged from [facility] on 07/11/22. He was diagnosed with sepsis from infected right inguinal incisional wound (s/p node biopsy) The wound remains open and home health is coming for wound care. Maureen Ralphs would like to know if her husband would be able to get chemo as scheduled tomorrow. This message sent to Georga Kaufmann, PA  Pt has port flush with labs/clinic visit with Georga Kaufmann, PA and chemo all scheduled tomorrow, 07/16/22

## 2022-07-16 ENCOUNTER — Other Ambulatory Visit: Payer: Medicare Other

## 2022-07-16 ENCOUNTER — Ambulatory Visit: Payer: Medicare Other | Admitting: Physician Assistant

## 2022-07-16 ENCOUNTER — Telehealth: Payer: Self-pay

## 2022-07-16 ENCOUNTER — Ambulatory Visit: Payer: Medicare Other

## 2022-07-16 ENCOUNTER — Inpatient Hospital Stay: Payer: Medicare Other

## 2022-07-16 ENCOUNTER — Inpatient Hospital Stay: Payer: Medicare Other | Attending: Physician Assistant | Admitting: Physician Assistant

## 2022-07-16 ENCOUNTER — Telehealth: Payer: Self-pay | Admitting: Physician Assistant

## 2022-07-16 VITALS — BP 107/57 | HR 75 | Temp 97.9°F | Resp 19 | Ht 75.0 in | Wt 276.0 lb

## 2022-07-16 DIAGNOSIS — R7989 Other specified abnormal findings of blood chemistry: Secondary | ICD-10-CM | POA: Diagnosis not present

## 2022-07-16 DIAGNOSIS — L02214 Cutaneous abscess of groin: Secondary | ICD-10-CM | POA: Insufficient documentation

## 2022-07-16 DIAGNOSIS — C8338 Diffuse large B-cell lymphoma, lymph nodes of multiple sites: Secondary | ICD-10-CM | POA: Diagnosis not present

## 2022-07-16 DIAGNOSIS — Z87891 Personal history of nicotine dependence: Secondary | ICD-10-CM | POA: Diagnosis not present

## 2022-07-16 DIAGNOSIS — Z5111 Encounter for antineoplastic chemotherapy: Secondary | ICD-10-CM | POA: Insufficient documentation

## 2022-07-16 DIAGNOSIS — Z5112 Encounter for antineoplastic immunotherapy: Secondary | ICD-10-CM | POA: Diagnosis not present

## 2022-07-16 DIAGNOSIS — D509 Iron deficiency anemia, unspecified: Secondary | ICD-10-CM | POA: Diagnosis not present

## 2022-07-16 LAB — CMP (CANCER CENTER ONLY)
ALT: 140 U/L — ABNORMAL HIGH (ref 0–44)
AST: 69 U/L — ABNORMAL HIGH (ref 15–41)
Albumin: 3.6 g/dL (ref 3.5–5.0)
Alkaline Phosphatase: 63 U/L (ref 38–126)
Anion gap: 4 — ABNORMAL LOW (ref 5–15)
BUN: 12 mg/dL (ref 8–23)
CO2: 27 mmol/L (ref 22–32)
Calcium: 8.8 mg/dL — ABNORMAL LOW (ref 8.9–10.3)
Chloride: 109 mmol/L (ref 98–111)
Creatinine: 1.07 mg/dL (ref 0.61–1.24)
GFR, Estimated: >60 mL/min (ref >60)
Glucose, Bld: 237 mg/dL — ABNORMAL HIGH (ref 70–99)
Potassium: 4.2 mmol/L (ref 3.5–5.1)
Sodium: 140 mmol/L (ref 135–145)
Total Bilirubin: 0.3 mg/dL (ref 0.3–1.2)
Total Protein: 6.5 g/dL (ref 6.5–8.1)

## 2022-07-16 LAB — CBC WITH DIFFERENTIAL (CANCER CENTER ONLY)
Abs Immature Granulocytes: 0.29 10*3/uL — ABNORMAL HIGH (ref 0.00–0.07)
Basophils Absolute: 0.1 10*3/uL (ref 0.0–0.1)
Basophils Relative: 2 %
Eosinophils Absolute: 0.1 10*3/uL (ref 0.0–0.5)
Eosinophils Relative: 2 %
HCT: 30.8 % — ABNORMAL LOW (ref 39.0–52.0)
Hemoglobin: 10 g/dL — ABNORMAL LOW (ref 13.0–17.0)
Immature Granulocytes: 5 %
Lymphocytes Relative: 15 %
Lymphs Abs: 0.8 10*3/uL (ref 0.7–4.0)
MCH: 26.5 pg (ref 26.0–34.0)
MCHC: 32.5 g/dL (ref 30.0–36.0)
MCV: 81.5 fL (ref 80.0–100.0)
Monocytes Absolute: 0.5 10*3/uL (ref 0.1–1.0)
Monocytes Relative: 8 %
Neutro Abs: 3.8 10*3/uL (ref 1.7–7.7)
Neutrophils Relative %: 68 %
Platelet Count: 292 10*3/uL (ref 150–400)
RBC: 3.78 MIL/uL — ABNORMAL LOW (ref 4.22–5.81)
RDW: 18.3 % — ABNORMAL HIGH (ref 11.5–15.5)
WBC Count: 5.6 10*3/uL (ref 4.0–10.5)
nRBC: 0 % (ref 0.0–0.2)

## 2022-07-16 LAB — CULTURE, BLOOD (ROUTINE X 2)

## 2022-07-16 LAB — LACTATE DEHYDROGENASE: LDH: 404 U/L — ABNORMAL HIGH (ref 98–192)

## 2022-07-16 LAB — URIC ACID: Uric Acid, Serum: 2.2 mg/dL — ABNORMAL LOW (ref 3.7–8.6)

## 2022-07-16 NOTE — Telephone Encounter (Signed)
This nurse reached out to Dr. Eliot Ford office at [facility].  Advised that patient needs a surgical follow up to receive clearance to resume chemotherapy.  This nurse was informed that Dr Freida Busman is performing surgery all next week however, she will check and see if the patient can be seen by the PA to get the clearance and return the call.  No further questions or concerns noted at this time.

## 2022-07-16 NOTE — Progress Notes (Unsigned)
[facility] [phone]   [phone]  PROGRESS NOTE  Patient Care Team: Juliette Alcide, MD as PCP - General Branch, Dorothe Pea, MD as PCP - Cardiology (Cardiology) Roma Kayser, MD as Consulting Physician (Endocrinology) Gwenith Daily, RN as [facility] Management  Hematological/Oncological History # Diffuse Large B Cell Lymphoma, Stage III 03/16/2022: establish care with Dr. Leonides Schanz [facility] 04/14/2022: excisional lymph node biopsy of right inguinal node shows DLBCL arising from follicular lymphoma 04/21/2022: PET CT scan showed widespread hypermetabolic lymphadenopathy in the neck, chest, abdomen, and pelvis 05/06/2021: Defer Cycle 1 Day 1 of R-CHOP chemotherapy. Patient has wound dehiscence and possible infection.  Seen by surgery same day. 05/14/2022: Cycle 1 Day 1 of R-CHOP chemotherapy 06/03/2022: Cycle 2 Day 1 of R-CHOP chemotherapy 06/25/2022: Cycle 3 Day 1 of R-CHOP chemotherapy 07/16/2022: Cycle 4, Day 1 of R-CHOP chemotherapy HELD due to healing right groin abscess  Interval History:  Alex Cortez 72 y.o. male with medical history significant for newly diagnosed diffuse large B-cell lymphoma who presents for a follow up visit. The patient's last visit was on 06/25/2022. In the interim since the last visit he was admitted from 07/11/2022-07/14/2022 right groin cellulitis/abscess. He underwent I&D on 4/29 with culture that demonstrated growth of staph aureus that was pansensitivity. He was transition to oral antibiotics for 7 days with bactrim at discharge.   On exam today Alex Cortez is accompanied by his wife.  His wound is slowing healing. His wife is packing the wound and reports there is still drainage but incision is getting smaller. He is taking his bactrim antibiotic which will    He reports that he is tolerating his treatment without any significant limitations. He reports his energy levels are  fairly stable. He does have some fatigue but is able to do his baseline ADLs. He denies any appetite or weight changes since the last visit. He denies nausea, vomiting or abdominal pain. His bowel habits are unchanged without recurrent episodes of diarrhea or constipation. He denies easy bruising or signs of bleeding. He reports persistent but stable neuropathy in his hands and feet that was present before starting chemotherapy. He reports stable shortness of breath with exertion. He denies fevers, chills, sweats, chest pain or cough. He has no other complaints. Rest of the 10 point ROS is below.     MEDICAL HISTORY:  Past Medical History:  Diagnosis Date   CAD (coronary artery disease)    nonobstructive CAD/patent LAD stent site; EF 65%, 08/2011; NSTEMI/DES LAD, 6/20 11   Chest pain    Diabetes mellitus    insulin-dependent   Dyslipidemia    HTN (hypertension)    Hypothyroidism    Myocardial infarction (HCC) 2011   Post MI syndrome (HCC) 08/29/2009   Stroke (HCC) 10/22/2012   Left inferior pons    SURGICAL HISTORY: Past Surgical History:  Procedure Laterality Date   COLONOSCOPY  08/2021   CORONARY ANGIOPLASTY WITH STENT PLACEMENT  2011   IR IMAGING GUIDED PORT INSERTION  04/26/2022   LYMPH NODE BIOPSY Right 04/14/2022   Procedure: RIGHT INGUINAL EXCISIONAL LYMPH NODE BIOPSY;  Surgeon: Fritzi Mandes, MD;  Location: [facility];  Service: General;  Laterality: Right;    SOCIAL HISTORY: Social History   Socioeconomic History   Marital status: Married    Spouse name: Not on file   Number of children: Not on file   Years of education: Not on file  Highest education level: Not on file  Occupational History   Not on file  Tobacco Use   Smoking status: Former    Packs/day: 0.50    Years: 23.00    Additional pack years: 0.00    Total pack years: 11.50    Types: Cigarettes    Start date: 03/16/1955    Quit date: 03/15/1988    Years since quitting: 34.3   Smokeless tobacco: Never   Vaping Use   Vaping Use: Never used  Substance and Sexual Activity   Alcohol use: No    Alcohol/week: 0.0 standard drinks of alcohol   Drug use: No   Sexual activity: Not on file  Other Topics Concern   Not on file  Social History Narrative   Works at [employer]. Married.    Social Determinants of Health   Financial Resource Strain: Low Risk  (06/24/2022)   Overall Financial Resource Strain (CARDIA)    Difficulty of Paying Living Expenses: Not very hard  Food Insecurity: No Food Insecurity (07/11/2022)   Hunger Vital Sign    Worried About Running Out of Food in the Last Year: Never true    Ran Out of Food in the Last Year: Never true  Transportation Needs: No Transportation Needs (07/11/2022)   PRAPARE - Administrator, Civil Service (Medical): No    Lack of Transportation (Non-Medical): No  Physical Activity: Inactive (05/24/2022)   Exercise Vital Sign    Days of Exercise per Week: 0 days    Minutes of Exercise per Session: 0 min  Stress: No Stress Concern Present (04/07/2022)   [employer] of Occupational Health - Occupational Stress Questionnaire    Feeling of Stress : Not at all  Social Connections: Unknown (07/31/2017)   Social Connection and Isolation Panel [NHANES]    Frequency of Communication with Friends and Family: Patient declined    Frequency of Social Gatherings with Friends and Family: Patient declined    Attends Religious Services: Patient declined    Database administrator or Organizations: Patient declined    Attends Banker Meetings: Patient declined    Marital Status: Patient declined  Intimate Partner Violence: Not At Risk (07/11/2022)   Humiliation, Afraid, Rape, and Kick questionnaire    Fear of Current or Ex-Partner: No    Emotionally Abused: No    Physically Abused: No    Sexually Abused: No    FAMILY HISTORY: Family History  Problem Relation Age of Onset   Colon cancer Mother    Hypertension Other        in all  family members    ALLERGIES:  is allergic to ace inhibitors, rituxan [rituximab], and shrimp [shellfish allergy].  MEDICATIONS:  Current Outpatient Medications  Medication Sig Dispense Refill   allopurinol (ZYLOPRIM) 300 MG tablet Take 1 tablet (300 mg total) by mouth daily. 90 tablet 1   amLODipine (NORVASC) 10 MG tablet Take 10 mg by mouth daily.     carvedilol (COREG) 25 MG tablet Take 1 tablet (25 mg total) by mouth 2 (two) times daily. 180 tablet 3   clopidogrel (PLAVIX) 75 MG tablet Take 1 tablet (75 mg total) by mouth daily. 30 tablet 5   Continuous Blood Gluc Receiver (FREESTYLE LIBRE 2 READER) DEVI As directed 1 each 0   Continuous Blood Gluc Sensor (FREESTYLE LIBRE 2 SENSOR) MISC APPLY 1 SENSOR EVERY 14    DAYS 6 each 0   dapagliflozin propanediol (FARXIGA) 5 MG TABS tablet TAKE 1  TABLET BY MOUTH EVERY DAY BEFORE BREAKFAST (Patient taking differently: Take 5 mg by mouth daily before breakfast.) 90 tablet 0   ezetimibe (ZETIA) 10 MG tablet TAKE 1 TABLET BY MOUTH EVERY DAY 90 tablet 2   fluticasone (FLONASE) 50 MCG/ACT nasal spray Place 2 sprays into both nostrils daily as needed for allergies.     insulin regular human CONCENTRATED (HUMULIN R U-500 KWIKPEN) 500 UNIT/ML KwikPen INJECT AS DIRECTED 70 UNITS WITH BREAKFAST, 70 UNITS WITH LUNCH, AND 70 UNITS WITH SUPPER FOR PRE-MEAL BLOOD GLUCOSE READINGS OF 90MG /DL OR ABOVE (Patient taking differently: Inject 70 Units into the skin with breakfast, with lunch, and with evening meal. For pre-meal glucose readings of 90mg /dl or above.) 45 mL 0   isosorbide mononitrate (IMDUR) 30 MG 24 hr tablet TAKE 1 TABLET BY MOUTH EVERY DAY (Patient taking differently: Take 30 mg by mouth daily.) 90 tablet 1   levothyroxine (SYNTHROID) 75 MCG tablet TAKE 1 TABLET BY MOUTH EVERY DAY BEFORE BREAKFAST (Patient taking differently: Take 75 mcg by mouth daily before breakfast.) 90 tablet 0   lidocaine-prilocaine (EMLA) cream Apply 1 Application topically as  needed. (Patient taking differently: Apply 1 Application topically daily as needed (port access).) 30 g 0   losartan (COZAAR) 50 MG tablet Take 1 tablet (50 mg total) by mouth daily.     metFORMIN (GLUCOPHAGE) 500 MG tablet Take 1 tablet (500 mg total) by mouth daily with breakfast. 90 tablet 1   nitroGLYCERIN (NITROSTAT) 0.4 MG SL tablet Place 1 tablet (0.4 mg total) under the tongue every 5 (five) minutes as needed. (Patient not taking: Reported on 07/11/2022) 25 tablet 3   ondansetron (ZOFRAN) 8 MG tablet Take 1 tablet (8 mg total) by mouth every 8 (eight) hours as needed. (Patient taking differently: Take 8 mg by mouth every 8 (eight) hours as needed for vomiting, nausea or refractory nausea / vomiting.) 30 tablet 0   ONE TOUCH ULTRA TEST test strip USE TO TEST BLOOD SUGAR FOUR TIMES DAILY E11.65 150 each 5   predniSONE (DELTASONE) 10 MG tablet Take 6 tablets (60 mg total) by mouth daily with breakfast. Take 6 tablets on Day 1 of chemotherapy and continue for 5 days. Repeat with each chemotherapy cycle. (Patient not taking: Reported on 07/11/2022) 30 tablet 5   predniSONE (DELTASONE) 20 MG tablet Take 60 mg by mouth See admin instructions. 60 mg once daily for 5 days, starting on day 1 of chemotherapy. Repeat with each chemotherapy cycle.     prochlorperazine (COMPAZINE) 10 MG tablet Take 1 tablet (10 mg total) by mouth every 6 (six) hours as needed for nausea or vomiting. 30 tablet 0   rosuvastatin (CRESTOR) 40 MG tablet TAKE 1 TABLET BY MOUTH EVERY DAY 90 tablet 2   sulfamethoxazole-trimethoprim (BACTRIM DS) 800-160 MG tablet Take 1 tablet by mouth 2 (two) times daily for 7 days. 14 tablet 0   No current facility-administered medications for this visit.    REVIEW OF SYSTEMS:   Constitutional: ( - ) fevers, ( - )  chills , ( - ) night sweats Eyes: ( - ) blurriness of vision, ( - ) double vision, ( - ) watery eyes Ears, nose, mouth, throat, and face: ( - ) mucositis, ( - ) sore  throat Respiratory: ( - ) cough, ( + ) dyspnea, ( - ) wheezes Cardiovascular: ( - ) palpitation, ( - ) chest discomfort, ( - ) lower extremity swelling Gastrointestinal:  ( - ) nausea, ( - )  heartburn, ( - ) change in bowel habits Skin: ( - ) abnormal skin rashes Lymphatics: ( - ) new lymphadenopathy, ( - ) easy bruising Neurological: ( + ) numbness, ( - ) tingling, ( - ) new weaknesses Behavioral/Psych: ( - ) mood change, ( - ) new changes  All other systems were reviewed with the patient and are negative.  PHYSICAL EXAMINATION: ECOG PERFORMANCE STATUS: 1 - Symptomatic but completely ambulatory  Vitals:   07/16/22 1017  BP: (!) 107/57  Pulse: 75  Resp: 19  Temp: 97.9 F (36.6 C)  SpO2: 95%    Filed Weights   07/16/22 1017  Weight: 276 lb (125.2 kg)     GENERAL: Well-appearing elderly African-American male alert, no distress and comfortable SKIN: skin color, texture, turgor are normal, no rashes or significant lesions. EYES: conjunctiva are pink and non-injected, sclera clear NECK: supple, non-tender LYMPH:   palpable lymphadenopathy in the cervical and axillary nodes.  LUNGS: clear to auscultation and percussion with normal breathing effort HEART: regular rate & rhythm and no murmurs and no lower extremity edema Musculoskeletal: no cyanosis of digits and no clubbing  PSYCH: alert & oriented x 3, fluent speech NEURO: no focal motor/sensory deficits  LABORATORY DATA:  I have reviewed the data as listed    Latest Ref Rng & Units 07/16/2022    9:55 AM 07/13/2022    5:04 AM 07/12/2022    5:07 AM  CBC  WBC 4.0 - 10.5 K/uL 5.6  11.4  18.7   Hemoglobin 13.0 - 17.0 g/dL 40.3  9.2  9.0   Hematocrit 39.0 - 52.0 % 30.8  28.5  28.3   Platelets 150 - 400 K/uL 292  148  116        Latest Ref Rng & Units 07/16/2022    9:55 AM 07/14/2022    4:51 AM 07/13/2022    5:04 AM  CMP  Glucose 70 - 99 mg/dL 474  259  563   BUN 8 - 23 mg/dL 12  14  17    Creatinine 0.61 - 1.24 mg/dL 8.75   6.43  3.29   Sodium 135 - 145 mmol/L 140  136  136   Potassium 3.5 - 5.1 mmol/L 4.2  3.9  3.9   Chloride 98 - 111 mmol/L 109  106  105   CO2 22 - 32 mmol/L 27  23  23    Calcium 8.9 - 10.3 mg/dL 8.8  8.4  8.3   Total Protein 6.5 - 8.1 g/dL 6.5     Total Bilirubin 0.3 - 1.2 mg/dL 0.3     Alkaline Phos 38 - 126 U/L 63     AST 15 - 41 U/L 69     ALT 0 - 44 U/L 140       RADIOGRAPHIC STUDIES: I have personally reviewed the radiological images as listed and agreed with the findings in the report: Diffuse involving the lymph nodes in the neck, underarms, chest, abdomen and groin. [place] EKG SITE RITE  Result Date: 07/11/2022 If [facility] image not attached, placement could not be confirmed due to current cardiac rhythm.  DG Chest 2 View  Result Date: 07/11/2022 CLINICAL DATA:  Possible sepsis EXAM: CHEST - 2 VIEW COMPARISON:  02/13/2022 FINDINGS: Cardiac shadow is enlarged but stable. Right chest wall port is noted in satisfactory position. Lungs are well aerated bilaterally. No focal infiltrate or effusion is seen. No bony abnormality is noted. IMPRESSION: No active cardiopulmonary disease. Electronically Signed  By: Alcide Clever M.D.   On: 07/11/2022 02:37    ASSESSMENT & PLAN Alex Cortez 72 y.o. male with medical history significant for newly diagnosed diffuse large B-cell lymphoma who presents for a follow up visit.  At this time findings are most consistent with diffuse large B-cell lymphoma stage III.  The difference tween stage III and stage IV is academic as there is no difference in treatment.  We will plan to proceed with 6 cycles of R-CHOP chemotherapy.  Chemotherapy will be administered every 3 weeks with G-CSF therapy to be provided on day 3.  Patient will be administered chemotherapy of full-strength.  His echocardiogram shows good baseline cardiac function.  Also his labs are strong at baseline.  The patient voices understanding of the chemotherapy and the plan moving  forward.  # Diffuse Large B Cell Lymphoma Stage III/IV -- At this time findings are most consistent with a diffuse large B-cell lymphoma arising from follicular lymphoma, stage III/IV.  Would favor stage 3 as there is no clear evidence of organ involvement, may have bone marrow involvement. -- Will proceed with R-CHOP chemotherapy.  Discussed the risks and benefits of this therapy as well as the anticipated side effects and scheduling. -- Cycle 1 Day 1 of R-CHOP chemotherapy on 05/14/2022 Plan: -- Due for Cycle 3, Day 1 today. --Labs today show white blood cell count 6.9, hemoglobin 11.0, MCV 81.1, and platelets of 203. Creatinine and LFTs normal.  --Proceed with treatment today without any dose modifications.   --will order mid-treatment PET scan in the 2-3 weeks.  --RTC in 3 weeks for Cycle 4 Day 1 of treatment.  #Microcytic anemia: --Labs today show Hgb 11.0 --Iron panel checked on 05/12/2022 without deficiency.   # Wound Dehiscence/ Possible Wound Infection--improving --Completed antibiotic therapy with Bactrim. --Was evaluated by surgical team on 05/10/2022 with improvement of wound. They gave clearance to start chemotherapy scheduled for 05/14/2022.   #Supportive Care -- chemotherapy education complete -- port placed -- zofran 8mg  q8H PRN and compazine 10mg  PO q6H for nausea -- allopurinol 300mg  PO daily for TLS prophylaxis -- EMLA cream for port -- no pain medication required at this time.    Orders Placed This Encounter  Procedures   Uric acid    Standing Status:   Future    Standing Expiration Date:   07/23/2023   Lactate dehydrogenase (LDH)    Standing Status:   Future    Standing Expiration Date:   07/23/2023   CBC with Differential (Cancer Center Only)    Standing Status:   Future    Standing Expiration Date:   07/23/2023   CMP (Cancer Center only)    Standing Status:   Future    Standing Expiration Date:   07/23/2023    All questions were answered. The patient knows  to call the clinic with any problems, questions or concerns.  A total of more than 30 minutes were spent on this encounter with face-to-face time and non-face-to-face time, including preparing to see the patient, ordering tests and/or medications, counseling the patient and coordination of care as outlined above.   Georga Kaufmann PA-C Dept of Hematology and Oncology [facility] Phone: [phone]   07/16/2022 3:36 PM

## 2022-07-16 NOTE — Patient Instructions (Signed)

## 2022-07-16 NOTE — Progress Notes (Signed)
Patient seen by Georga Kaufmann, PA-C  Vitals are within treatment parameters.  Labs reviewed: and are within treatment parameters.  Per physician team, patient will not be receiving treatment today.  Due to needing clearance from surgeon to proceed with his treatment.  Infusion team made aware.  Port flushed, locked with heparin and de-accessed.  Patient tolerated well and was stable at discharge.

## 2022-07-18 ENCOUNTER — Encounter: Payer: Self-pay | Admitting: Hematology and Oncology

## 2022-07-18 DIAGNOSIS — M109 Gout, unspecified: Secondary | ICD-10-CM | POA: Diagnosis not present

## 2022-07-18 DIAGNOSIS — Z794 Long term (current) use of insulin: Secondary | ICD-10-CM | POA: Diagnosis not present

## 2022-07-18 DIAGNOSIS — N179 Acute kidney failure, unspecified: Secondary | ICD-10-CM | POA: Diagnosis not present

## 2022-07-18 DIAGNOSIS — B9562 Methicillin resistant Staphylococcus aureus infection as the cause of diseases classified elsewhere: Secondary | ICD-10-CM | POA: Diagnosis not present

## 2022-07-18 DIAGNOSIS — Z8673 Personal history of transient ischemic attack (TIA), and cerebral infarction without residual deficits: Secondary | ICD-10-CM | POA: Diagnosis not present

## 2022-07-18 DIAGNOSIS — Z9181 History of falling: Secondary | ICD-10-CM | POA: Diagnosis not present

## 2022-07-18 DIAGNOSIS — Z955 Presence of coronary angioplasty implant and graft: Secondary | ICD-10-CM | POA: Diagnosis not present

## 2022-07-18 DIAGNOSIS — Z7984 Long term (current) use of oral hypoglycemic drugs: Secondary | ICD-10-CM | POA: Diagnosis not present

## 2022-07-18 DIAGNOSIS — L03314 Cellulitis of groin: Secondary | ICD-10-CM | POA: Diagnosis not present

## 2022-07-18 DIAGNOSIS — C8330 Diffuse large B-cell lymphoma, unspecified site: Secondary | ICD-10-CM | POA: Diagnosis not present

## 2022-07-18 DIAGNOSIS — Z7952 Long term (current) use of systemic steroids: Secondary | ICD-10-CM | POA: Diagnosis not present

## 2022-07-18 DIAGNOSIS — E039 Hypothyroidism, unspecified: Secondary | ICD-10-CM | POA: Diagnosis not present

## 2022-07-18 DIAGNOSIS — Z6835 Body mass index (BMI) 35.0-35.9, adult: Secondary | ICD-10-CM | POA: Diagnosis not present

## 2022-07-18 DIAGNOSIS — E6609 Other obesity due to excess calories: Secondary | ICD-10-CM | POA: Diagnosis not present

## 2022-07-18 DIAGNOSIS — I2510 Atherosclerotic heart disease of native coronary artery without angina pectoris: Secondary | ICD-10-CM | POA: Diagnosis not present

## 2022-07-18 DIAGNOSIS — E782 Mixed hyperlipidemia: Secondary | ICD-10-CM | POA: Diagnosis not present

## 2022-07-18 DIAGNOSIS — Z79899 Other long term (current) drug therapy: Secondary | ICD-10-CM | POA: Diagnosis not present

## 2022-07-18 DIAGNOSIS — E1151 Type 2 diabetes mellitus with diabetic peripheral angiopathy without gangrene: Secondary | ICD-10-CM | POA: Diagnosis not present

## 2022-07-18 DIAGNOSIS — I110 Hypertensive heart disease with heart failure: Secondary | ICD-10-CM | POA: Diagnosis not present

## 2022-07-18 DIAGNOSIS — L02214 Cutaneous abscess of groin: Secondary | ICD-10-CM | POA: Diagnosis not present

## 2022-07-18 DIAGNOSIS — Z7902 Long term (current) use of antithrombotics/antiplatelets: Secondary | ICD-10-CM | POA: Diagnosis not present

## 2022-07-18 DIAGNOSIS — I5030 Unspecified diastolic (congestive) heart failure: Secondary | ICD-10-CM | POA: Diagnosis not present

## 2022-07-19 ENCOUNTER — Encounter: Payer: Self-pay | Admitting: Hematology and Oncology

## 2022-07-19 ENCOUNTER — Inpatient Hospital Stay: Payer: Medicare Other

## 2022-07-19 ENCOUNTER — Ambulatory Visit: Payer: Medicare Other

## 2022-07-20 LAB — AEROBIC/ANAEROBIC CULTURE W GRAM STAIN (SURGICAL/DEEP WOUND)

## 2022-07-21 DIAGNOSIS — E1122 Type 2 diabetes mellitus with diabetic chronic kidney disease: Secondary | ICD-10-CM | POA: Diagnosis not present

## 2022-07-21 DIAGNOSIS — D72829 Elevated white blood cell count, unspecified: Secondary | ICD-10-CM | POA: Diagnosis not present

## 2022-07-21 DIAGNOSIS — N1831 Chronic kidney disease, stage 3a: Secondary | ICD-10-CM | POA: Diagnosis not present

## 2022-07-21 DIAGNOSIS — C8330 Diffuse large B-cell lymphoma, unspecified site: Secondary | ICD-10-CM | POA: Diagnosis not present

## 2022-07-21 DIAGNOSIS — N179 Acute kidney failure, unspecified: Secondary | ICD-10-CM | POA: Diagnosis not present

## 2022-07-21 DIAGNOSIS — A419 Sepsis, unspecified organism: Secondary | ICD-10-CM | POA: Diagnosis not present

## 2022-07-21 DIAGNOSIS — L02214 Cutaneous abscess of groin: Secondary | ICD-10-CM | POA: Diagnosis not present

## 2022-07-22 ENCOUNTER — Other Ambulatory Visit: Payer: Medicare Other

## 2022-07-22 ENCOUNTER — Encounter (HOSPITAL_COMMUNITY)
Admission: RE | Admit: 2022-07-22 | Discharge: 2022-07-22 | Disposition: A | Discharge status: Home / Self Care | Payer: Medicare Other | Source: Ambulatory Visit | Attending: Physician Assistant | Admitting: Physician Assistant

## 2022-07-22 DIAGNOSIS — C8330 Diffuse large B-cell lymphoma, unspecified site: Secondary | ICD-10-CM

## 2022-07-22 DIAGNOSIS — C8338 Diffuse large B-cell lymphoma, lymph nodes of multiple sites: Secondary | ICD-10-CM | POA: Insufficient documentation

## 2022-07-22 DIAGNOSIS — C8335 Diffuse large B-cell lymphoma, lymph nodes of inguinal region and lower limb: Secondary | ICD-10-CM | POA: Diagnosis not present

## 2022-07-22 LAB — GLUCOSE, CAPILLARY: Glucose-Capillary: 123 mg/dL — ABNORMAL HIGH (ref 70–99)

## 2022-07-22 MED ORDER — HEPARIN SOD (PORK) LOCK FLUSH 100 UNIT/ML IV SOLN
500.0000 [IU] | Freq: Once | INTRAVENOUS | Status: AC
  Administered 2022-07-22: 500 [IU] via INTRAVENOUS

## 2022-07-22 MED ORDER — FLUDEOXYGLUCOSE F - 18 (FDG) INJECTION
13.8100 | Freq: Once | INTRAVENOUS | Status: AC
  Administered 2022-07-22: 13.81 via INTRAVENOUS

## 2022-07-22 MED FILL — Dexamethasone Sodium Phosphate Inj 100 MG/10ML: INTRAMUSCULAR | Qty: 1 | Status: AC

## 2022-07-22 MED FILL — Fosaprepitant Dimeglumine For IV Infusion 150 MG (Base Eq): INTRAVENOUS | Qty: 5 | Status: AC

## 2022-07-23 ENCOUNTER — Ambulatory Visit: Payer: Medicare Other

## 2022-07-23 ENCOUNTER — Other Ambulatory Visit: Payer: Self-pay

## 2022-07-23 ENCOUNTER — Inpatient Hospital Stay: Payer: Medicare Other

## 2022-07-23 ENCOUNTER — Inpatient Hospital Stay (HOSPITAL_BASED_OUTPATIENT_CLINIC_OR_DEPARTMENT_OTHER): Payer: Medicare Other | Admitting: Physician Assistant

## 2022-07-23 ENCOUNTER — Ambulatory Visit: Payer: Self-pay | Admitting: *Deleted

## 2022-07-23 VITALS — BP 116/61 | HR 70 | Temp 97.8°F | Resp 20 | Ht 75.0 in | Wt 276.8 lb

## 2022-07-23 DIAGNOSIS — C8338 Diffuse large B-cell lymphoma, lymph nodes of multiple sites: Secondary | ICD-10-CM

## 2022-07-23 DIAGNOSIS — L02214 Cutaneous abscess of groin: Secondary | ICD-10-CM | POA: Diagnosis not present

## 2022-07-23 DIAGNOSIS — Z5112 Encounter for antineoplastic immunotherapy: Secondary | ICD-10-CM | POA: Diagnosis not present

## 2022-07-23 DIAGNOSIS — D509 Iron deficiency anemia, unspecified: Secondary | ICD-10-CM | POA: Diagnosis not present

## 2022-07-23 DIAGNOSIS — Z95828 Presence of other vascular implants and grafts: Secondary | ICD-10-CM

## 2022-07-23 DIAGNOSIS — Z5111 Encounter for antineoplastic chemotherapy: Secondary | ICD-10-CM | POA: Diagnosis not present

## 2022-07-23 DIAGNOSIS — R7989 Other specified abnormal findings of blood chemistry: Secondary | ICD-10-CM | POA: Diagnosis not present

## 2022-07-23 LAB — CBC WITH DIFFERENTIAL (CANCER CENTER ONLY)
Abs Immature Granulocytes: 0.05 10*3/uL (ref 0.00–0.07)
Basophils Absolute: 0.1 10*3/uL (ref 0.0–0.1)
Basophils Relative: 2 %
Eosinophils Absolute: 0.3 10*3/uL (ref 0.0–0.5)
Eosinophils Relative: 5 %
HCT: 33.4 % — ABNORMAL LOW (ref 39.0–52.0)
Hemoglobin: 10.5 g/dL — ABNORMAL LOW (ref 13.0–17.0)
Immature Granulocytes: 1 %
Lymphocytes Relative: 17 %
Lymphs Abs: 1 10*3/uL (ref 0.7–4.0)
MCH: 26.2 pg (ref 26.0–34.0)
MCHC: 31.4 g/dL (ref 30.0–36.0)
MCV: 83.3 fL (ref 80.0–100.0)
Monocytes Absolute: 0.4 10*3/uL (ref 0.1–1.0)
Monocytes Relative: 8 %
Neutro Abs: 3.8 10*3/uL (ref 1.7–7.7)
Neutrophils Relative %: 67 %
Platelet Count: 323 10*3/uL (ref 150–400)
RBC: 4.01 MIL/uL — ABNORMAL LOW (ref 4.22–5.81)
RDW: 19.3 % — ABNORMAL HIGH (ref 11.5–15.5)
Smear Review: NORMAL
WBC Count: 5.6 10*3/uL (ref 4.0–10.5)
nRBC: 0 % (ref 0.0–0.2)

## 2022-07-23 LAB — CMP (CANCER CENTER ONLY)
ALT: 43 U/L (ref 0–44)
AST: 23 U/L (ref 15–41)
Albumin: 3.8 g/dL (ref 3.5–5.0)
Alkaline Phosphatase: 58 U/L (ref 38–126)
Anion gap: 5 (ref 5–15)
BUN: 11 mg/dL (ref 8–23)
CO2: 27 mmol/L (ref 22–32)
Calcium: 8.9 mg/dL (ref 8.9–10.3)
Chloride: 109 mmol/L (ref 98–111)
Creatinine: 1.01 mg/dL (ref 0.61–1.24)
GFR, Estimated: >60 mL/min (ref >60)
Glucose, Bld: 163 mg/dL — ABNORMAL HIGH (ref 70–99)
Potassium: 4.2 mmol/L (ref 3.5–5.1)
Sodium: 141 mmol/L (ref 135–145)
Total Bilirubin: 0.5 mg/dL (ref 0.3–1.2)
Total Protein: 6.7 g/dL (ref 6.5–8.1)

## 2022-07-23 LAB — LACTATE DEHYDROGENASE: LDH: 361 U/L — ABNORMAL HIGH (ref 98–192)

## 2022-07-23 LAB — URIC ACID: Uric Acid, Serum: 2.5 mg/dL — ABNORMAL LOW (ref 3.7–8.6)

## 2022-07-23 MED ORDER — SODIUM CHLORIDE 0.9 % IV SOLN
150.0000 mg | Freq: Once | INTRAVENOUS | Status: AC
  Administered 2022-07-23: 150 mg via INTRAVENOUS
  Filled 2022-07-23: qty 5
  Filled 2022-07-23: qty 150

## 2022-07-23 MED ORDER — SODIUM CHLORIDE 0.9% FLUSH
10.0000 mL | Freq: Once | INTRAVENOUS | Status: AC
  Administered 2022-07-23: 10 mL

## 2022-07-23 MED ORDER — SODIUM CHLORIDE 0.9% FLUSH
10.0000 mL | INTRAVENOUS | Status: DC | PRN
  Administered 2022-07-23: 10 mL

## 2022-07-23 MED ORDER — SODIUM CHLORIDE 0.9 % IV SOLN
750.0000 mg/m2 | Freq: Once | INTRAVENOUS | Status: AC
  Administered 2022-07-23: 2000 mg via INTRAVENOUS
  Filled 2022-07-23: qty 100

## 2022-07-23 MED ORDER — MONTELUKAST SODIUM 10 MG PO TABS
10.0000 mg | ORAL_TABLET | Freq: Once | ORAL | Status: AC
  Administered 2022-07-23: 10 mg via ORAL
  Filled 2022-07-23: qty 1

## 2022-07-23 MED ORDER — DOXORUBICIN HCL CHEMO IV INJECTION 2 MG/ML
50.0000 mg/m2 | Freq: Once | INTRAVENOUS | Status: AC
  Administered 2022-07-23: 132 mg via INTRAVENOUS
  Filled 2022-07-23: qty 66

## 2022-07-23 MED ORDER — SODIUM CHLORIDE 0.9 % IV SOLN
10.0000 mg | Freq: Once | INTRAVENOUS | Status: AC
  Administered 2022-07-23: 10 mg via INTRAVENOUS
  Filled 2022-07-23: qty 10
  Filled 2022-07-23: qty 1

## 2022-07-23 MED ORDER — ACETAMINOPHEN 325 MG PO TABS
650.0000 mg | ORAL_TABLET | Freq: Once | ORAL | Status: AC
  Administered 2022-07-23: 650 mg via ORAL
  Filled 2022-07-23: qty 2

## 2022-07-23 MED ORDER — SODIUM CHLORIDE 0.9 % IV SOLN: Freq: Once | INTRAVENOUS | Status: AC

## 2022-07-23 MED ORDER — HEPARIN SOD (PORK) LOCK FLUSH 100 UNIT/ML IV SOLN
500.0000 [IU] | Freq: Once | INTRAVENOUS | Status: AC | PRN
  Administered 2022-07-23: 500 [IU]

## 2022-07-23 MED ORDER — DIPHENHYDRAMINE HCL 25 MG PO CAPS
50.0000 mg | ORAL_CAPSULE | Freq: Once | ORAL | Status: AC
  Administered 2022-07-23: 50 mg via ORAL
  Filled 2022-07-23: qty 2

## 2022-07-23 MED ORDER — VINCRISTINE SULFATE CHEMO INJECTION 1 MG/ML
2.0000 mg | Freq: Once | INTRAVENOUS | Status: AC
  Administered 2022-07-23: 2 mg via INTRAVENOUS
  Filled 2022-07-23: qty 2

## 2022-07-23 MED ORDER — FAMOTIDINE 20 MG IN NS 100 ML IVPB
20.0000 mg | Freq: Once | INTRAVENOUS | Status: AC
  Administered 2022-07-23: 20 mg via INTRAVENOUS
  Filled 2022-07-23: qty 100

## 2022-07-23 MED ORDER — SODIUM CHLORIDE 0.9 % IV SOLN
375.0000 mg/m2 | Freq: Once | INTRAVENOUS | Status: AC
  Administered 2022-07-23: 1000 mg via INTRAVENOUS
  Filled 2022-07-23: qty 100

## 2022-07-23 MED ORDER — PALONOSETRON HCL INJECTION 0.25 MG/5ML
0.2500 mg | Freq: Once | INTRAVENOUS | Status: AC
  Administered 2022-07-23: 0.25 mg via INTRAVENOUS
  Filled 2022-07-23: qty 5

## 2022-07-23 NOTE — Patient Outreach (Signed)
  Care Coordination   07/23/2022 Name: Alex Cortez MRN: [id number] DOB: 08/11/1950   Care Coordination Outreach Attempts:  An unsuccessful telephone outreach was attempted for a scheduled appointment today.  Follow Up Plan:  Additional outreach attempts will be made to offer the patient care coordination information and services.   Encounter Outcome:  No Answer. Left HIPAA compliant VM.   Care Coordination Interventions:  No, not indicated    Demetrios Loll, BSN, RN-BC RN Care Coordinator [facility]  [facility] Direct Dial: [phone] Main #: [phone]

## 2022-07-23 NOTE — Progress Notes (Signed)
[facility] [phone]   [phone]  PROGRESS NOTE  Patient Care Team: Juliette Alcide, MD as PCP - General Branch, Dorothe Pea, MD as PCP - Cardiology (Cardiology) Roma Kayser, MD as Consulting Physician (Endocrinology) Gwenith Daily, RN as [facility] Management  Hematological/Oncological History # Diffuse Large B Cell Lymphoma, Stage III 03/16/2022: establish care with Dr. Leonides Schanz [facility] 04/14/2022: excisional lymph node biopsy of right inguinal node shows DLBCL arising from follicular lymphoma 04/21/2022: PET CT scan showed widespread hypermetabolic lymphadenopathy in the neck, chest, abdomen, and pelvis 05/06/2021: Defer Cycle 1 Day 1 of R-CHOP chemotherapy. Patient has wound dehiscence and possible infection.  Seen by surgery same day. 05/14/2022: Cycle 1 Day 1 of R-CHOP chemotherapy 06/03/2022: Cycle 2 Day 1 of R-CHOP chemotherapy 06/25/2022: Cycle 3 Day 1 of R-CHOP chemotherapy 07/16/2022: Cycle 4, Day 1 of R-CHOP chemotherapy HELD due to healing right groin abscess 07/23/2022: Cycle 4, Day 1 of R-CHOP  Interval History:  Alex Cortez 72 y.o. male with medical history significant for newly diagnosed diffuse large B-cell lymphoma who presents for a follow up visit. The patient's last visit was on 07/16/2022. In the interim since the last visit he was evaluated by surgical team yesterday who provided clearance to resume chemotherapy scheduled for today.   On exam today Alex Cortez is accompanied by his wife.  He reports that he is feeling well and wound is nearly healed. He denies any changes in his appetite or energy levels. He denies nausea, vomiting or abdominal pain. His bowel habits are unchanged without recurrent episodes of diarrhea or constipation. He denies easy bruising or signs of bleeding. He reports persistent but stable neuropathy in his hands and feet that was present before starting  chemotherapy. His shortness of breath with exertion is stable and not present at rest. He denies fevers, chills, sweats, chest pain or cough. He has no other complaints. Rest of the 10 point ROS is below.    MEDICAL HISTORY:  Past Medical History:  Diagnosis Date   CAD (coronary artery disease)    nonobstructive CAD/patent LAD stent site; EF 65%, 08/2011; NSTEMI/DES LAD, 6/20 11   Chest pain    Diabetes mellitus    insulin-dependent   Dyslipidemia    HTN (hypertension)    Hypothyroidism    Myocardial infarction (HCC) 2011   Post MI syndrome (HCC) 08/29/2009   Stroke (HCC) 10/22/2012   Left inferior pons    SURGICAL HISTORY: Past Surgical History:  Procedure Laterality Date   COLONOSCOPY  08/2021   CORONARY ANGIOPLASTY WITH STENT PLACEMENT  2011   IR IMAGING GUIDED PORT INSERTION  04/26/2022   LYMPH NODE BIOPSY Right 04/14/2022   Procedure: RIGHT INGUINAL EXCISIONAL LYMPH NODE BIOPSY;  Surgeon: Fritzi Mandes, MD;  Location: [facility];  Service: General;  Laterality: Right;    SOCIAL HISTORY: Social History   Socioeconomic History   Marital status: Married    Spouse name: Not on file   Number of children: Not on file   Years of education: Not on file   Highest education level: Not on file  Occupational History   Not on file  Tobacco Use   Smoking status: Former    Packs/day: 0.50    Years: 23.00    Additional pack years: 0.00    Total pack years: 11.50    Types: Cigarettes    Start date: 03/16/1955    Quit date: 03/15/1988  Years since quitting: 34.3   Smokeless tobacco: Never  Vaping Use   Vaping Use: Never used  Substance and Sexual Activity   Alcohol use: No    Alcohol/week: 0.0 standard drinks of alcohol   Drug use: No   Sexual activity: Not on file  Other Topics Concern   Not on file  Social History Narrative   Works at [employer]. Married.    Social Determinants of Health   Financial Resource Strain: Low Risk  (06/24/2022)   Overall Financial Resource  Strain (CARDIA)    Difficulty of Paying Living Expenses: Not very hard  Food Insecurity: No Food Insecurity (07/11/2022)   Hunger Vital Sign    Worried About Running Out of Food in the Last Year: Never true    Ran Out of Food in the Last Year: Never true  Transportation Needs: No Transportation Needs (07/11/2022)   PRAPARE - Administrator, Civil Service (Medical): No    Lack of Transportation (Non-Medical): No  Physical Activity: Inactive (05/24/2022)   Exercise Vital Sign    Days of Exercise per Week: 0 days    Minutes of Exercise per Session: 0 min  Stress: No Stress Concern Present (04/07/2022)   [employer] of Occupational Health - Occupational Stress Questionnaire    Feeling of Stress : Not at all  Social Connections: Unknown (07/31/2017)   Social Connection and Isolation Panel [NHANES]    Frequency of Communication with Friends and Family: Patient declined    Frequency of Social Gatherings with Friends and Family: Patient declined    Attends Religious Services: Patient declined    Database administrator or Organizations: Patient declined    Attends Banker Meetings: Patient declined    Marital Status: Patient declined  Intimate Partner Violence: Not At Risk (07/11/2022)   Humiliation, Afraid, Rape, and Kick questionnaire    Fear of Current or Ex-Partner: No    Emotionally Abused: No    Physically Abused: No    Sexually Abused: No    FAMILY HISTORY: Family History  Problem Relation Age of Onset   Colon cancer Mother    Hypertension Other        in all family members    ALLERGIES:  is allergic to ace inhibitors, rituxan [rituximab], and shrimp [shellfish allergy].  MEDICATIONS:  Current Outpatient Medications  Medication Sig Dispense Refill   allopurinol (ZYLOPRIM) 300 MG tablet Take 1 tablet (300 mg total) by mouth daily. 90 tablet 1   amLODipine (NORVASC) 10 MG tablet Take 10 mg by mouth daily.     carvedilol (COREG) 25 MG tablet Take 1  tablet (25 mg total) by mouth 2 (two) times daily. 180 tablet 3   clopidogrel (PLAVIX) 75 MG tablet Take 1 tablet (75 mg total) by mouth daily. 30 tablet 5   Continuous Blood Gluc Receiver (FREESTYLE LIBRE 2 READER) DEVI As directed 1 each 0   Continuous Blood Gluc Sensor (FREESTYLE LIBRE 2 SENSOR) MISC APPLY 1 SENSOR EVERY 14    DAYS 6 each 0   dapagliflozin propanediol (FARXIGA) 5 MG TABS tablet TAKE 1 TABLET BY MOUTH EVERY DAY BEFORE BREAKFAST (Patient taking differently: Take 5 mg by mouth daily before breakfast.) 90 tablet 0   ezetimibe (ZETIA) 10 MG tablet TAKE 1 TABLET BY MOUTH EVERY DAY 90 tablet 2   fluticasone (FLONASE) 50 MCG/ACT nasal spray Place 2 sprays into both nostrils daily as needed for allergies.     insulin regular human CONCENTRATED (HUMULIN  R U-500 KWIKPEN) 500 UNIT/ML KwikPen INJECT AS DIRECTED 70 UNITS WITH BREAKFAST, 70 UNITS WITH LUNCH, AND 70 UNITS WITH SUPPER FOR PRE-MEAL BLOOD GLUCOSE READINGS OF 90MG /DL OR ABOVE (Patient taking differently: Inject 70 Units into the skin with breakfast, with lunch, and with evening meal. For pre-meal glucose readings of 90mg /dl or above.) 45 mL 0   isosorbide mononitrate (IMDUR) 30 MG 24 hr tablet TAKE 1 TABLET BY MOUTH EVERY DAY (Patient taking differently: Take 30 mg by mouth daily.) 90 tablet 1   levothyroxine (SYNTHROID) 75 MCG tablet TAKE 1 TABLET BY MOUTH EVERY DAY BEFORE BREAKFAST (Patient taking differently: Take 75 mcg by mouth daily before breakfast.) 90 tablet 0   lidocaine-prilocaine (EMLA) cream Apply 1 Application topically as needed. (Patient taking differently: Apply 1 Application topically daily as needed (port access).) 30 g 0   losartan (COZAAR) 50 MG tablet Take 1 tablet (50 mg total) by mouth daily.     metFORMIN (GLUCOPHAGE) 500 MG tablet Take 1 tablet (500 mg total) by mouth daily with breakfast. 90 tablet 1   nitroGLYCERIN (NITROSTAT) 0.4 MG SL tablet Place 1 tablet (0.4 mg total) under the tongue every 5 (five)  minutes as needed. (Patient not taking: Reported on 07/11/2022) 25 tablet 3   ondansetron (ZOFRAN) 8 MG tablet Take 1 tablet (8 mg total) by mouth every 8 (eight) hours as needed. (Patient taking differently: Take 8 mg by mouth every 8 (eight) hours as needed for vomiting, nausea or refractory nausea / vomiting.) 30 tablet 0   ONE TOUCH ULTRA TEST test strip USE TO TEST BLOOD SUGAR FOUR TIMES DAILY E11.65 150 each 5   predniSONE (DELTASONE) 10 MG tablet Take 6 tablets (60 mg total) by mouth daily with breakfast. Take 6 tablets on Day 1 of chemotherapy and continue for 5 days. Repeat with each chemotherapy cycle. (Patient not taking: Reported on 07/11/2022) 30 tablet 5   predniSONE (DELTASONE) 20 MG tablet Take 60 mg by mouth See admin instructions. 60 mg once daily for 5 days, starting on day 1 of chemotherapy. Repeat with each chemotherapy cycle.     prochlorperazine (COMPAZINE) 10 MG tablet Take 1 tablet (10 mg total) by mouth every 6 (six) hours as needed for nausea or vomiting. 30 tablet 0   rosuvastatin (CRESTOR) 40 MG tablet TAKE 1 TABLET BY MOUTH EVERY DAY 90 tablet 2   No current facility-administered medications for this visit.   Facility-Administered Medications Ordered in Other Visits  Medication Dose Route Frequency Provider Last Rate Last Admin   heparin lock flush 100 unit/mL  500 Units Intracatheter Once PRN Jaci Standard, MD       riTUXimab-pvvr (RUXIENCE) 1,000 mg in sodium chloride 0.9 % 250 mL (2.8571 mg/mL) infusion  375 mg/m2 (Treatment Plan Recorded) Intravenous Once Jaci Standard, MD       sodium chloride flush (NS) 0.9 % injection 10 mL  10 mL Intracatheter PRN Jaci Standard, MD        REVIEW OF SYSTEMS:   Constitutional: ( - ) fevers, ( - )  chills , ( - ) night sweats Eyes: ( - ) blurriness of vision, ( - ) double vision, ( - ) watery eyes Ears, nose, mouth, throat, and face: ( - ) mucositis, ( - ) sore throat Respiratory: ( - ) cough, ( + ) dyspnea, ( - )  wheezes Cardiovascular: ( - ) palpitation, ( - ) chest discomfort, ( - ) lower extremity swelling  Gastrointestinal:  ( - ) nausea, ( - ) heartburn, ( - ) change in bowel habits Skin: ( - ) abnormal skin rashes Lymphatics: ( - ) new lymphadenopathy, ( - ) easy bruising Neurological: ( + ) numbness, ( - ) tingling, ( - ) new weaknesses Behavioral/Psych: ( - ) mood change, ( - ) new changes  All other systems were reviewed with the patient and are negative.  PHYSICAL EXAMINATION: ECOG PERFORMANCE STATUS: 1 - Symptomatic but completely ambulatory  There were no vitals filed for this visit.  There were no vitals filed for this visit.   Day 1, Cycle 4 07/23/22  Height 6\' 3"  (1.905 m)  Weight 276 lb 12.8 oz (125.6 kg)  BSA (Calculated - sq m) 2.58 sq meters  Temp 98.2 F (36.8 C)  Temp src Oral  Pulse 77  Resp 20  BP 109/57 !     GENERAL: Well-appearing elderly African-American male alert, no distress and comfortable SKIN: skin color, texture, turgor are normal, no rashes or significant lesions. EYES: conjunctiva are pink and non-injected, sclera clear LUNGS: clear to auscultation and percussion with normal breathing effort HEART: regular rate & rhythm and no murmurs and no lower extremity edema Musculoskeletal: no cyanosis of digits and no clubbing  PSYCH: alert & oriented x 3, fluent speech NEURO: no focal motor/sensory deficits  LABORATORY DATA:  I have reviewed the data as listed    Latest Ref Rng & Units 07/23/2022    9:12 AM 07/16/2022    9:55 AM 07/13/2022    5:04 AM  CBC  WBC 4.0 - 10.5 K/uL 5.6  5.6  11.4   Hemoglobin 13.0 - 17.0 g/dL 44.0  34.7  9.2   Hematocrit 39.0 - 52.0 % 33.4  30.8  28.5   Platelets 150 - 400 K/uL 323  292  148        Latest Ref Rng & Units 07/23/2022    9:12 AM 07/16/2022    9:55 AM 07/14/2022    4:51 AM  CMP  Glucose 70 - 99 mg/dL 425  956  387   BUN 8 - 23 mg/dL 11  12  14    Creatinine 0.61 - 1.24 mg/dL 5.64  3.32  9.51   Sodium 135 -  145 mmol/L 141  140  136   Potassium 3.5 - 5.1 mmol/L 4.2  4.2  3.9   Chloride 98 - 111 mmol/L 109  109  106   CO2 22 - 32 mmol/L 27  27  23    Calcium 8.9 - 10.3 mg/dL 8.9  8.8  8.4   Total Protein 6.5 - 8.1 g/dL 6.7  6.5    Total Bilirubin 0.3 - 1.2 mg/dL 0.5  0.3    Alkaline Phos 38 - 126 U/L 58  63    AST 15 - 41 U/L 23  69    ALT 0 - 44 U/L 43  140      RADIOGRAPHIC STUDIES: I have personally reviewed the radiological images as listed and agreed with the findings in the report: Diffuse involving the lymph nodes in the neck, underarms, chest, abdomen and groin. [place] EKG SITE RITE  Result Date: 07/11/2022 If [facility] image not attached, placement could not be confirmed due to current cardiac rhythm.  DG Chest 2 View  Result Date: 07/11/2022 CLINICAL DATA:  Possible sepsis EXAM: CHEST - 2 VIEW COMPARISON:  02/13/2022 FINDINGS: Cardiac shadow is enlarged but stable. Right chest wall port is noted in satisfactory  position. Lungs are well aerated bilaterally. No focal infiltrate or effusion is seen. No bony abnormality is noted. IMPRESSION: No active cardiopulmonary disease. Electronically Signed   By: Alcide Clever M.D.   On: 07/11/2022 02:37    ASSESSMENT & PLAN Alex Cortez 72 y.o. male with medical history significant for newly diagnosed diffuse large B-cell lymphoma who presents for a follow up visit.  At this time findings are most consistent with diffuse large B-cell lymphoma stage III.  The difference tween stage III and stage IV is academic as there is no difference in treatment.  We will plan to proceed with 6 cycles of R-CHOP chemotherapy.  Chemotherapy will be administered every 3 weeks with G-CSF therapy to be provided on day 3.  Patient will be administered chemotherapy of full-strength.  His echocardiogram shows good baseline cardiac function.  Also his labs are strong at baseline.  The patient voices understanding of the chemotherapy and the plan moving forward.  # Diffuse  Large B Cell Lymphoma Stage III/IV -- At this time findings are most consistent with a diffuse large B-cell lymphoma arising from follicular lymphoma, stage III/IV.  Would favor stage 3 as there is no clear evidence of organ involvement, may have bone marrow involvement. -- Will proceed with R-CHOP chemotherapy.  Discussed the risks and benefits of this therapy as well as the anticipated side effects and scheduling. -- Cycle 1 Day 1 of R-CHOP chemotherapy on 05/14/2022 Plan: -- Due for Cycle 4, Day 1 today. --Labs today show white blood cell count 5.6, hemoglobin 10.5, MCV 83.3, and platelets of 323. Creatinine and LFTs normal.  --Proceed with treatment today since Dr. Freida Busman gave clearance to resume chemotherapy yesterday.  --Mid treatment PET scan was done yesterday, pending final report.  --RTC in 3 weeks for Cycle 5 Day 1 of treatment   #Microcytic anemia: --Labs today show Hgb 10.5, improving --Iron panel checked on 05/12/2022 without deficiency.   # Wound Dehiscence/Wound Infection--recurrence --Completed antibiotic therapy with Bactrim. --Was evaluated by surgical team on 05/10/2022 with improvement of wound. They gave clearance to start chemotherapy scheduled for 05/14/2022.  --Readmitted 07/11/2022-07/14/2022 right groin cellulitis/abscess. He underwent I&D on 4/29 with culture that demonstrated growth of staph aureus that was pansensitivity. He was transition to oral antibiotics for 7 days with bactrim at discharge. --Had follow up with Dr. Freida Busman yesterday on 07/22/2022. Gave clearance to resume chemotherapy today. Patient was advised to gently pack the opening with a small piece of gauze for about 3 days and then he no longer needs to pack it with hopes that it will close up quickly.   #Supportive Care -- chemotherapy education complete -- port placed -- zofran 8mg  q8H PRN and compazine 10mg  PO q6H for nausea -- allopurinol 300mg  PO daily for TLS prophylaxis -- EMLA cream for port -- no  pain medication required at this time.    No orders of the defined types were placed in this encounter.   All questions were answered. The patient knows to call the clinic with any problems, questions or concerns.  A total of more than 30 minutes were spent on this encounter with face-to-face time and non-face-to-face time, including preparing to see the patient, ordering tests and/or medications, counseling the patient and coordination of care as outlined above.   Georga Kaufmann PA-C Dept of Hematology and Oncology [facility] Phone: [phone]   07/23/2022 12:59 PM

## 2022-07-23 NOTE — Patient Instructions (Signed)
[facility]  Discharge Instructions: Thank you for choosing [facility] to provide your oncology and hematology care.   If you have a lab appointment with the Cancer Center, please go directly to the Cancer Center and check in at the registration area.   Wear comfortable clothing and clothing appropriate for easy access to any Portacath or PICC line.   We strive to give you quality time with your provider. You may need to reschedule your appointment if you arrive late (15 or more minutes).  Arriving late affects you and other patients whose appointments are after yours.  Also, if you miss three or more appointments without notifying the office, you may be dismissed from the clinic at the provider's discretion.      For prescription refill requests, have your pharmacy contact our office and allow 72 hours for refills to be completed.    Today you received the following chemotherapy and/or immunotherapy agents: doxorubicin, vincristine, cyclophosphamide, rituximab      To help prevent nausea and vomiting after your treatment, we encourage you to take your nausea medication as directed.  BELOW ARE SYMPTOMS THAT SHOULD BE REPORTED IMMEDIATELY: *FEVER GREATER THAN 100.4 F (38 C) OR HIGHER *CHILLS OR SWEATING *NAUSEA AND VOMITING THAT IS NOT CONTROLLED WITH YOUR NAUSEA MEDICATION *UNUSUAL SHORTNESS OF BREATH *UNUSUAL BRUISING OR BLEEDING *URINARY PROBLEMS (pain or burning when urinating, or frequent urination) *BOWEL PROBLEMS (unusual diarrhea, constipation, pain near the anus) TENDERNESS IN MOUTH AND THROAT WITH OR WITHOUT PRESENCE OF ULCERS (sore throat, sores in mouth, or a toothache) UNUSUAL RASH, SWELLING OR PAIN  UNUSUAL VAGINAL DISCHARGE OR ITCHING   Items with * indicate a potential emergency and should be followed up as soon as possible or go to the Emergency Department if any problems should occur.  Please show the CHEMOTHERAPY  ALERT CARD or IMMUNOTHERAPY ALERT CARD at check-in to the Emergency Department and triage nurse.  Should you have questions after your visit or need to cancel or reschedule your appointment, please contact [facility]  Dept: [phone]  and follow the prompts.  Office hours are 8:00 a.m. to 4:30 p.m. Monday - Friday. Please note that voicemails left after 4:00 p.m. may not be returned until the following business day.  We are closed weekends and major holidays. You have access to a nurse at all times for urgent questions. Please call the main number to the clinic Dept: [phone] and follow the prompts.   For any non-urgent questions, you may also contact your provider using MyChart. We now offer e-Visits for anyone 18 and older to request care online for non-urgent symptoms. For details visit [email or web address].   Also download the MyChart app! Go to the app store, search "MyChart", open the app, select [facility], and log in with your MyChart username and password.   

## 2022-07-26 ENCOUNTER — Inpatient Hospital Stay: Payer: Medicare Other

## 2022-07-26 ENCOUNTER — Other Ambulatory Visit: Payer: Self-pay

## 2022-07-26 VITALS — BP 133/72 | HR 73 | Temp 98.4°F | Resp 20

## 2022-07-26 DIAGNOSIS — L02214 Cutaneous abscess of groin: Secondary | ICD-10-CM | POA: Diagnosis not present

## 2022-07-26 DIAGNOSIS — C8338 Diffuse large B-cell lymphoma, lymph nodes of multiple sites: Secondary | ICD-10-CM | POA: Diagnosis not present

## 2022-07-26 DIAGNOSIS — D509 Iron deficiency anemia, unspecified: Secondary | ICD-10-CM | POA: Diagnosis not present

## 2022-07-26 DIAGNOSIS — Z5111 Encounter for antineoplastic chemotherapy: Secondary | ICD-10-CM | POA: Diagnosis not present

## 2022-07-26 DIAGNOSIS — R7989 Other specified abnormal findings of blood chemistry: Secondary | ICD-10-CM | POA: Diagnosis not present

## 2022-07-26 DIAGNOSIS — Z5112 Encounter for antineoplastic immunotherapy: Secondary | ICD-10-CM | POA: Diagnosis not present

## 2022-07-26 MED ORDER — PEGFILGRASTIM-PBBK 6 MG/0.6ML ~~LOC~~ SOSY
6.0000 mg | PREFILLED_SYRINGE | Freq: Once | SUBCUTANEOUS | Status: AC
  Administered 2022-07-26: 6 mg via SUBCUTANEOUS
  Filled 2022-07-26: qty 0.6

## 2022-07-26 NOTE — Patient Instructions (Signed)

## 2022-07-27 DIAGNOSIS — E1151 Type 2 diabetes mellitus with diabetic peripheral angiopathy without gangrene: Secondary | ICD-10-CM | POA: Diagnosis not present

## 2022-07-27 DIAGNOSIS — L02214 Cutaneous abscess of groin: Secondary | ICD-10-CM | POA: Diagnosis not present

## 2022-07-27 DIAGNOSIS — L03314 Cellulitis of groin: Secondary | ICD-10-CM | POA: Diagnosis not present

## 2022-07-27 DIAGNOSIS — I110 Hypertensive heart disease with heart failure: Secondary | ICD-10-CM | POA: Diagnosis not present

## 2022-07-27 DIAGNOSIS — I5030 Unspecified diastolic (congestive) heart failure: Secondary | ICD-10-CM | POA: Diagnosis not present

## 2022-07-27 DIAGNOSIS — B9562 Methicillin resistant Staphylococcus aureus infection as the cause of diseases classified elsewhere: Secondary | ICD-10-CM | POA: Diagnosis not present

## 2022-07-28 DIAGNOSIS — L02214 Cutaneous abscess of groin: Secondary | ICD-10-CM | POA: Diagnosis not present

## 2022-07-28 DIAGNOSIS — E1151 Type 2 diabetes mellitus with diabetic peripheral angiopathy without gangrene: Secondary | ICD-10-CM | POA: Diagnosis not present

## 2022-07-28 DIAGNOSIS — L03314 Cellulitis of groin: Secondary | ICD-10-CM | POA: Diagnosis not present

## 2022-07-28 DIAGNOSIS — I5030 Unspecified diastolic (congestive) heart failure: Secondary | ICD-10-CM | POA: Diagnosis not present

## 2022-07-28 DIAGNOSIS — I2510 Atherosclerotic heart disease of native coronary artery without angina pectoris: Secondary | ICD-10-CM | POA: Diagnosis not present

## 2022-07-28 DIAGNOSIS — I110 Hypertensive heart disease with heart failure: Secondary | ICD-10-CM | POA: Diagnosis not present

## 2022-07-28 DIAGNOSIS — B9562 Methicillin resistant Staphylococcus aureus infection as the cause of diseases classified elsewhere: Secondary | ICD-10-CM | POA: Diagnosis not present

## 2022-07-28 DIAGNOSIS — C8330 Diffuse large B-cell lymphoma, unspecified site: Secondary | ICD-10-CM | POA: Diagnosis not present

## 2022-07-30 DIAGNOSIS — E1151 Type 2 diabetes mellitus with diabetic peripheral angiopathy without gangrene: Secondary | ICD-10-CM | POA: Diagnosis not present

## 2022-07-30 DIAGNOSIS — L02214 Cutaneous abscess of groin: Secondary | ICD-10-CM | POA: Diagnosis not present

## 2022-07-30 DIAGNOSIS — I110 Hypertensive heart disease with heart failure: Secondary | ICD-10-CM | POA: Diagnosis not present

## 2022-07-30 DIAGNOSIS — I5030 Unspecified diastolic (congestive) heart failure: Secondary | ICD-10-CM | POA: Diagnosis not present

## 2022-07-30 DIAGNOSIS — B9562 Methicillin resistant Staphylococcus aureus infection as the cause of diseases classified elsewhere: Secondary | ICD-10-CM | POA: Diagnosis not present

## 2022-07-30 DIAGNOSIS — L03314 Cellulitis of groin: Secondary | ICD-10-CM | POA: Diagnosis not present

## 2022-08-03 ENCOUNTER — Ambulatory Visit: Payer: Medicare Other | Admitting: "Endocrinology

## 2022-08-05 ENCOUNTER — Ambulatory Visit: Payer: Medicare Other | Admitting: Hematology and Oncology

## 2022-08-05 ENCOUNTER — Ambulatory Visit: Payer: Medicare Other

## 2022-08-05 ENCOUNTER — Ambulatory Visit: Payer: Self-pay | Admitting: *Deleted

## 2022-08-05 ENCOUNTER — Other Ambulatory Visit: Payer: Medicare Other

## 2022-08-05 NOTE — Patient Outreach (Signed)
  Care Coordination   08/05/2022 Name: Alex Cortez MRN: [id number] DOB: [birth year]   Care Coordination Outreach Attempts:  An unsuccessful telephone outreach was attempted for a scheduled appointment today.  Follow Up Plan:  Additional outreach attempts will be made to offer the patient care coordination information and services.   Encounter Outcome:  No Answer   Care Coordination Interventions:  No, not indicated    Demetrios Loll, BSN, RN-BC RN Care Coordinator [facility]  [facility] Direct Dial: [phone] Main #: [phone]

## 2022-08-06 ENCOUNTER — Ambulatory Visit: Payer: Medicare Other | Admitting: Physician Assistant

## 2022-08-06 ENCOUNTER — Other Ambulatory Visit: Payer: Medicare Other

## 2022-08-06 ENCOUNTER — Ambulatory Visit: Payer: Medicare Other

## 2022-08-06 DIAGNOSIS — I5030 Unspecified diastolic (congestive) heart failure: Secondary | ICD-10-CM | POA: Diagnosis not present

## 2022-08-06 DIAGNOSIS — L03314 Cellulitis of groin: Secondary | ICD-10-CM | POA: Diagnosis not present

## 2022-08-06 DIAGNOSIS — E1151 Type 2 diabetes mellitus with diabetic peripheral angiopathy without gangrene: Secondary | ICD-10-CM | POA: Diagnosis not present

## 2022-08-06 DIAGNOSIS — I110 Hypertensive heart disease with heart failure: Secondary | ICD-10-CM | POA: Diagnosis not present

## 2022-08-06 DIAGNOSIS — B9562 Methicillin resistant Staphylococcus aureus infection as the cause of diseases classified elsewhere: Secondary | ICD-10-CM | POA: Diagnosis not present

## 2022-08-06 DIAGNOSIS — L02214 Cutaneous abscess of groin: Secondary | ICD-10-CM | POA: Diagnosis not present

## 2022-08-10 ENCOUNTER — Ambulatory Visit (INDEPENDENT_AMBULATORY_CARE_PROVIDER_SITE_OTHER): Payer: Medicare Other | Admitting: "Endocrinology

## 2022-08-10 ENCOUNTER — Encounter: Payer: Self-pay | Admitting: "Endocrinology

## 2022-08-10 ENCOUNTER — Ambulatory Visit: Payer: Medicare Other

## 2022-08-10 VITALS — BP 112/62 | HR 80 | Ht 75.0 in | Wt 273.0 lb

## 2022-08-10 DIAGNOSIS — I10 Essential (primary) hypertension: Secondary | ICD-10-CM | POA: Diagnosis not present

## 2022-08-10 DIAGNOSIS — E039 Hypothyroidism, unspecified: Secondary | ICD-10-CM | POA: Diagnosis not present

## 2022-08-10 DIAGNOSIS — E782 Mixed hyperlipidemia: Secondary | ICD-10-CM | POA: Diagnosis not present

## 2022-08-10 DIAGNOSIS — E1159 Type 2 diabetes mellitus with other circulatory complications: Secondary | ICD-10-CM

## 2022-08-10 DIAGNOSIS — Z794 Long term (current) use of insulin: Secondary | ICD-10-CM | POA: Insufficient documentation

## 2022-08-10 MED ORDER — HUMULIN R U-500 KWIKPEN 500 UNIT/ML ~~LOC~~ SOPN: PEN_INJECTOR | SUBCUTANEOUS | 0 refills | Status: DC

## 2022-08-10 NOTE — Progress Notes (Signed)
08/10/2022  Endocrinology follow-up note   Subjective:    Patient ID: Alex Cortez, male    DOB: [birth year], PCP Juliette Alcide, MD   Past Medical History:  Diagnosis Date   CAD (coronary artery disease)    nonobstructive CAD/patent LAD stent site; EF 65%, 08/2011; NSTEMI/DES LAD, 6/20 11   Chest pain    Diabetes mellitus    insulin-dependent   Dyslipidemia    HTN (hypertension)    Hypothyroidism    Myocardial infarction (HCC) 2011   Post MI syndrome (HCC) 08/29/2009   Stroke (HCC) 10/22/2012   Left inferior pons   Past Surgical History:  Procedure Laterality Date   COLONOSCOPY  08/2021   CORONARY ANGIOPLASTY WITH STENT PLACEMENT  2011   IR IMAGING GUIDED PORT INSERTION  04/26/2022   LYMPH NODE BIOPSY Right 04/14/2022   Procedure: RIGHT INGUINAL EXCISIONAL LYMPH NODE BIOPSY;  Surgeon: Fritzi Mandes, MD;  Location: [facility];  Service: General;  Laterality: Right;   Social History   Socioeconomic History   Marital status: Married    Spouse name: Not on file   Number of children: Not on file   Years of education: Not on file   Highest education level: Not on file  Occupational History   Not on file  Tobacco Use   Smoking status: Former    Packs/day: 0.50    Years: 23.00    Additional pack years: 0.00    Total pack years: 11.50    Types: Cigarettes    Start date: 03/16/1955    Quit date: 03/15/1988    Years since quitting: 34.4   Smokeless tobacco: Never  Vaping Use   Vaping Use: Never used  Substance and Sexual Activity   Alcohol use: No    Alcohol/week: 0.0 standard drinks of alcohol   Drug use: No   Sexual activity: Not on file  Other Topics Concern   Not on file  Social History Narrative   Works at [employer]. Married.    Social Determinants of Health   Financial Resource Strain: Low Risk  (06/24/2022)   Overall Financial Resource Strain (CARDIA)    Difficulty of Paying Living Expenses: Not very hard  Food Insecurity: No Food Insecurity  (07/11/2022)   Hunger Vital Sign    Worried About Running Out of Food in the Last Year: Never true    Ran Out of Food in the Last Year: Never true  Transportation Needs: No Transportation Needs (07/11/2022)   PRAPARE - Administrator, Civil Service (Medical): No    Lack of Transportation (Non-Medical): No  Physical Activity: Inactive (05/24/2022)   Exercise Vital Sign    Days of Exercise per Week: 0 days    Minutes of Exercise per Session: 0 min  Stress: No Stress Concern Present (04/07/2022)   [employer] of Occupational Health - Occupational Stress Questionnaire    Feeling of Stress : Not at all  Social Connections: Unknown (07/31/2017)   Social Connection and Isolation Panel [NHANES]    Frequency of Communication with Friends and Family: Patient declined    Frequency of Social Gatherings with Friends and Family: Patient declined    Attends Religious Services: Patient declined    Active Member of Clubs or Organizations: Patient declined    Attends Banker Meetings: Patient declined    Marital Status: Patient declined   Outpatient Encounter Medications as of 08/10/2022  Medication Sig   allopurinol (ZYLOPRIM) 300 MG tablet Take 1 tablet (300  mg total) by mouth daily.   amLODipine (NORVASC) 10 MG tablet Take 10 mg by mouth daily.   carvedilol (COREG) 25 MG tablet Take 1 tablet (25 mg total) by mouth 2 (two) times daily.   clopidogrel (PLAVIX) 75 MG tablet Take 1 tablet (75 mg total) by mouth daily.   Continuous Blood Gluc Receiver (FREESTYLE LIBRE 2 READER) DEVI As directed   Continuous Blood Gluc Sensor (FREESTYLE LIBRE 2 SENSOR) MISC APPLY 1 SENSOR EVERY 14    DAYS   dapagliflozin propanediol (FARXIGA) 5 MG TABS tablet TAKE 1 TABLET BY MOUTH EVERY DAY BEFORE BREAKFAST (Patient taking differently: Take 5 mg by mouth daily before breakfast.)   ezetimibe (ZETIA) 10 MG tablet TAKE 1 TABLET BY MOUTH EVERY DAY   fluticasone (FLONASE) 50 MCG/ACT nasal spray Place  2 sprays into both nostrils daily as needed for allergies.   insulin regular human CONCENTRATED (HUMULIN R U-500 KWIKPEN) 500 UNIT/ML KwikPen INJECT AS DIRECTED 70 UNITS WITH BREAKFAST, 70 UNITS WITH LUNCH, AND 70 UNITS WITH SUPPER FOR PRE-MEAL BLOOD GLUCOSE READINGS OF 90MG /DL OR ABOVE   isosorbide mononitrate (IMDUR) 30 MG 24 hr tablet TAKE 1 TABLET BY MOUTH EVERY DAY (Patient taking differently: Take 30 mg by mouth daily.)   levothyroxine (SYNTHROID) 75 MCG tablet TAKE 1 TABLET BY MOUTH EVERY DAY BEFORE BREAKFAST (Patient taking differently: Take 75 mcg by mouth daily before breakfast.)   lidocaine-prilocaine (EMLA) cream Apply 1 Application topically as needed. (Patient taking differently: Apply 1 Application topically daily as needed (port access).)   losartan (COZAAR) 50 MG tablet Take 1 tablet (50 mg total) by mouth daily.   metFORMIN (GLUCOPHAGE) 500 MG tablet Take 1 tablet (500 mg total) by mouth daily with breakfast.   nitroGLYCERIN (NITROSTAT) 0.4 MG SL tablet Place 1 tablet (0.4 mg total) under the tongue every 5 (five) minutes as needed. (Patient not taking: Reported on 07/11/2022)   ondansetron (ZOFRAN) 8 MG tablet Take 1 tablet (8 mg total) by mouth every 8 (eight) hours as needed. (Patient taking differently: Take 8 mg by mouth every 8 (eight) hours as needed for vomiting, nausea or refractory nausea / vomiting.)   ONE TOUCH ULTRA TEST test strip USE TO TEST BLOOD SUGAR FOUR TIMES DAILY E11.65   predniSONE (DELTASONE) 10 MG tablet Take 6 tablets (60 mg total) by mouth daily with breakfast. Take 6 tablets on Day 1 of chemotherapy and continue for 5 days. Repeat with each chemotherapy cycle. (Patient not taking: Reported on 08/10/2022)   predniSONE (DELTASONE) 20 MG tablet Take 60 mg by mouth See admin instructions. 60 mg once daily for 5 days, starting on day 1 of chemotherapy. Repeat with each chemotherapy cycle.   prochlorperazine (COMPAZINE) 10 MG tablet Take 1 tablet (10 mg total) by  mouth every 6 (six) hours as needed for nausea or vomiting.   rosuvastatin (CRESTOR) 40 MG tablet TAKE 1 TABLET BY MOUTH EVERY DAY   [DISCONTINUED] insulin regular human CONCENTRATED (HUMULIN R U-500 KWIKPEN) 500 UNIT/ML KwikPen INJECT AS DIRECTED 70 UNITS WITH BREAKFAST, 70 UNITS WITH LUNCH, AND 70 UNITS WITH SUPPER FOR PRE-MEAL BLOOD GLUCOSE READINGS OF 90MG /DL OR ABOVE (Patient taking differently: Inject 80 Units into the skin with breakfast, with lunch, and with evening meal. For pre-meal glucose readings of 90mg /dl or above.)   No facility-administered encounter medications on file as of 08/10/2022.   ALLERGIES: Allergies  Allergen Reactions   Ace Inhibitors Swelling    Angioedema   Rituxan [Rituximab] Other (See Comments)  Pt had sensitivity reaction to Rituxan. See progress note from 05/14/22. Pt able to complete infusion.    Shrimp [Shellfish Allergy] Hives and Swelling   VACCINATION STATUS: Immunization History  Administered Date(s) Administered   Influenza, High Dose Seasonal PF 12/31/2016   Moderna Sars-Covid-2 Vaccination 04/19/2019, 05/17/2019     Diabetes He presents for his follow-up diabetic visit. He has type 2 diabetes mellitus. Onset time: He was diagnosed at approximate age of 18 years. His disease course has been worsening. There are no hypoglycemic associated symptoms. Pertinent negatives for hypoglycemia include no confusion, headaches, pallor or seizures. Pertinent negatives for diabetes include no chest pain, no fatigue, no polydipsia, no polyphagia, no polyuria and no weakness. There are no hypoglycemic complications. Symptoms are worsening. Diabetic complications include a CVA, heart disease and nephropathy. Risk factors for coronary artery disease include diabetes mellitus, dyslipidemia, hypertension, male sex, obesity, sedentary lifestyle and tobacco exposure. Current diabetic treatment includes intensive insulin program. He is compliant with treatment most of the  time. His weight is fluctuating minimally. He is following a generally unhealthy (He consumes a large quantity of soda.) diet. When asked about meal planning, he reported none. He has had a previous visit with a dietitian. He never participates in exercise. His home blood glucose trend is fluctuating minimally. His breakfast blood glucose range is generally 140-180 mg/dl. His lunch blood glucose range is generally 140-180 mg/dl. His dinner blood glucose range is generally 140-180 mg/dl. His bedtime blood glucose range is generally 140-180 mg/dl. His overall blood glucose range is 140-180 mg/dl. (Mr. Golan presents with his CGM device.  Admittedly, he is eating 2 meals a day, hence missing at least a third of his insulin injection opportunities.  He is freestyle libre 3 AGP report shows 60% time in range, level 1 hyperglycemia 30%, level 2 hyperglycemia 6%.  He has 3% level 1 hypoglycemia.  His point-of-care A1c is 8.5% increasing from 8.1% during his last visit.  His average blood glucose for the last 14 days is 160 mg per DL.   ) An ACE inhibitor/angiotensin II receptor blocker is being taken. Eye exam is current.  Hyperlipidemia This is a chronic problem. The current episode started more than 1 year ago. The problem is controlled. Exacerbating diseases include chronic renal disease, diabetes and obesity. Pertinent negatives include no chest pain, leg pain, myalgias or shortness of breath. Current antihyperlipidemic treatment includes statins. Risk factors for coronary artery disease include diabetes mellitus, dyslipidemia, hypertension, male sex, obesity and a sedentary lifestyle.  Hypertension This is a chronic problem. The current episode started more than 1 year ago. The problem is controlled. Pertinent negatives include no chest pain, headaches, neck pain, palpitations or shortness of breath. Risk factors for coronary artery disease include obesity, male gender, dyslipidemia, diabetes mellitus,  sedentary lifestyle and family history. Past treatments include ACE inhibitors. Hypertensive end-organ damage includes kidney disease and CVA. Identifiable causes of hypertension include chronic renal disease.     Review of systems  Constitutional: + Gaining weight,  current  Body mass index is 34.12 kg/m. , no fatigue, no subjective hyperthermia, no subjective hypothermia    Objective:    BP 112/62   Pulse 80   Ht 6\' 3"  (1.905 m)   Wt 273 lb (123.8 kg)   BMI 34.12 kg/m   Wt Readings from Last 3 Encounters:  08/10/22 273 lb (123.8 kg)  07/23/22 276 lb 12.8 oz (125.6 kg)  07/16/22 276 lb (125.2 kg)  Physical Exam- Limited  Constitutional:  Body mass index is 34.12 kg/m. , not in acute distress, normal state of mind Eyes:  EOMI, no exophthalmos Neck: Supple        Latest Ref Rng & Units 07/23/2022    9:12 AM 07/16/2022    9:55 AM 07/14/2022    4:51 AM  CMP  Glucose 70 - 99 mg/dL 161  096  045   BUN 8 - 23 mg/dL 11  12  14    Creatinine 0.61 - 1.24 mg/dL 4.09  8.11  9.14   Sodium 135 - 145 mmol/L 141  140  136   Potassium 3.5 - 5.1 mmol/L 4.2  4.2  3.9   Chloride 98 - 111 mmol/L 109  109  106   CO2 22 - 32 mmol/L 27  27  23    Calcium 8.9 - 10.3 mg/dL 8.9  8.8  8.4   Total Protein 6.5 - 8.1 g/dL 6.7  6.5    Total Bilirubin 0.3 - 1.2 mg/dL 0.5  0.3    Alkaline Phos 38 - 126 U/L 58  63    AST 15 - 41 U/L 23  69    ALT 0 - 44 U/L 43  140       Lipid Panel     Component Value Date/Time   CHOL 135 12/26/2021 0000   CHOL 145 09/11/2021 0810   TRIG 88 12/26/2021 0000   HDL 35 12/26/2021 0000   HDL 35 (L) 09/11/2021 0810   CHOLHDL 4.1 09/11/2021 0810   CHOLHDL 4.5 08/01/2017 0243   VLDL 20 08/01/2017 0243   LDLCALC 82 12/26/2021 0000   LDLCALC 89 09/11/2021 0810   LABVLDL 21 09/11/2021 0810  A1c of 8.5% on July 11, 2022.   Assessment & Plan:   1. Type 2 diabetes mellitus with vascular disease (HCC)  -His  diabetes is complicated by recurrent CVA ,  coronary artery disease, obesity/sedentary life and patient remains at extremely  high risk for more acute and chronic complications of diabetes which include CAD, CVA, CKD, retinopathy, and neuropathy. These are all discussed in detail with the patient.  Mr. Breuninger presents with his CGM device.  Admittedly, he is eating 2 meals a day, hence missing at least a third of his insulin injection opportunities.  He is freestyle libre 3 AGP report shows 60% time in range, level 1 hyperglycemia 30%, level 2 hyperglycemia 6%.  He has 3% level 1 hypoglycemia.  His point-of-care A1c is 8.5% increasing from 8.1% during his last visit.  His average blood glucose for the last 14 days is 160 mg per DL.   - Glucose logs and insulin administration records pertaining to this visit,  to be scanned into patient's records.  Recent labs reviewed.  He is diagnosed with lymphoma recently, being prepared for chemotherapy.  - I have re-counseled the patient on diet management and weight loss  by adopting a carbohydrate restricted / protein rich  Diet.  - he acknowledges that there is a room for improvement in his food and drink choices. - Suggestion is made for him to avoid simple carbohydrates  from his diet including Cakes, Sweet Desserts, Ice Cream, Soda (diet and regular), Sweet Tea, Candies, Chips, Cookies, Store Bought Juices, Alcohol , Artificial Sweeteners,  Coffee Creamer, and "Sugar-free" Products, Lemonade. This will help patient to have more stable blood glucose profile and potentially avoid unintended weight gain.  The following Lifestyle Medicine recommendations according to [facility] of [facility]estyle [facility]  (  [facility]) were discussed and and offered to patient and he  agrees to start the journey:  A. Whole Foods, Plant-Based Nutrition comprising of fruits and vegetables, plant-based proteins, whole-grain carbohydrates was discussed in detail with the patient.   A list for source of those nutrients were also  provided to the patient.  Patient will use only water or unsweetened tea for hydration. B.  The need to stay away from risky substances including alcohol, smoking; obtaining 7 to 9 hours of restorative sleep, at least 150 minutes of moderate intensity exercise weekly, the importance of healthy social connections,  and stress management techniques were discussed. C.  A full color page of  Calorie density of various food groups per pound showing examples of each food groups was provided to the patient.   - Patient is advised to stick to a routine mealtimes to eat 3 meals  a day and avoid unnecessary snacks ( to snack only to correct hypoglycemia).  - I have approached patient with the following individualized plan to manage diabetes and patient agrees.  -  He would like to consider 3 meals a day and to stay on his current insulin regimen of Humulin U500.  He is advised to continue 70 units with breakfast, lunch, and supper   for pre-meal blood glucose readings of 90 mg/dL or above.   -I have advised him on how he can rotate insulin injection sites on his abdominal skin. -He is warned not not to take insulin without proper monitoring of blood glucose.  He is advised to  call clinic if he registers blood glucose less than 70 mg/dL or greater than 161 mg/dL.  He will continue to benefit from  metformin-advised to lower metformin to 500 mg p.o. daily at breakfast.  -He is advised to continue Farxiga 5 mg p.o. daily at breakfast.  - Side effects and precautions discussed with him.     - Patient specific target  for A1c; LDL, HDL, Triglycerides, and  Waist Circumference were discussed in detail.  2) BP/HTN: -His blood pressure is controlled to target.  This medication was stopped and he is currently on losartan 25 mg p.o. daily at breakfast.     3) Lipids/HPL: His lipid panel shows uncontrolled LDL at 82.  He is advised to continue atorvastatin 80 mg p.o. nightly.  Whole food plant-based diet will  help with his lipid panel, however, patient did not engage optimally.  Side effects and precautions discussed with him.   4)  Weight/Diet: Comes with weight gain.  His BMI is 34.12-clearly complicating his diabetes care.   Has no success for weight control mainly due to his dietary indiscretion, a good candidate for lifestyle medicine, however did not engage optimally.  He has consulted with CDE.   5) hypothyroidism- His recent  thyroid function tests are consistent with appropriate replacement.  He is advised to continue levothyroxine 75 mcg p.o. daily before breakfast.     - We discussed about the correct intake of his thyroid hormone, on empty stomach at fasting, with water, separated by at least 30 minutes from breakfast and other medications,  and separated by more than 4 hours from calcium, iron, multivitamins, acid reflux medications (PPIs). -Patient is made aware of the fact that thyroid hormone replacement is needed for life, dose to be adjusted by periodic monitoring of thyroid function tests.   6) Chronic Care/Health Maintenance:  -Patient  is  on ACEI/ARB and Statin medications and encouraged to continue to follow up  with Ophthalmology, urologist given his recent recurrent CVA, podiatrist at least yearly or according to recommendations, and advised to  stay away from smoking. I have recommended yearly flu vaccine and pneumonia vaccination at least every 5 years; moderate intensity exercise for up to 150 minutes weekly; and  sleep for at least 7 hours a day.  POCT ABI was normal on April 09, 2020, next study will be repeated in January 2027, or sooner if needed.    - I advised patient to maintain close follow up with Burdine, Ananias Pilgrim, MD for primary care needs.   I spent  41  minutes in the care of the patient today including review of labs from CMP, Lipids, Thyroid Function, Hematology (current and previous including abstractions from other facilities); face-to-face time  discussing  his blood glucose readings/logs, discussing hypoglycemia and hyperglycemia episodes and symptoms, medications doses, his options of short and long term treatment based on the latest standards of care / guidelines;  discussion about incorporating lifestyle medicine;  and documenting the encounter. Risk reduction counseling performed per USPSTF guidelines to reduce  obesity and cardiovascular risk factors.     Please refer to Patient Instructions for Blood Glucose Monitoring and Insulin/Medications Dosing Guide"  in media tab for additional information. Please  also refer to " Patient Self Inventory" in the Media  tab for reviewed elements of pertinent patient history.  Frances Furbish Primeau participated in the discussions, expressed understanding, and voiced agreement with the above plans.  All questions were answered to his satisfaction. he is encouraged to contact clinic should he have any questions or concerns prior to his return visit.     Follow up plan: -Return in about 4 months (around 12/11/2022) for Bring Meter/CGM Device/Logs- A1c in Office.  Marquis Lunch, MD Phone: [phone]  Fax: [phone]  -  This note was partially dictated with voice recognition software. Similar sounding words can be transcribed inadequately or may not  be corrected upon review.  08/10/2022, 5:30 PM

## 2022-08-10 NOTE — Patient Instructions (Signed)

## 2022-08-11 DIAGNOSIS — L03314 Cellulitis of groin: Secondary | ICD-10-CM | POA: Diagnosis not present

## 2022-08-11 DIAGNOSIS — L02214 Cutaneous abscess of groin: Secondary | ICD-10-CM | POA: Diagnosis not present

## 2022-08-11 DIAGNOSIS — B9562 Methicillin resistant Staphylococcus aureus infection as the cause of diseases classified elsewhere: Secondary | ICD-10-CM | POA: Diagnosis not present

## 2022-08-11 DIAGNOSIS — I5030 Unspecified diastolic (congestive) heart failure: Secondary | ICD-10-CM | POA: Diagnosis not present

## 2022-08-11 DIAGNOSIS — E1151 Type 2 diabetes mellitus with diabetic peripheral angiopathy without gangrene: Secondary | ICD-10-CM | POA: Diagnosis not present

## 2022-08-11 DIAGNOSIS — I110 Hypertensive heart disease with heart failure: Secondary | ICD-10-CM | POA: Diagnosis not present

## 2022-08-12 ENCOUNTER — Other Ambulatory Visit: Payer: Self-pay

## 2022-08-12 MED FILL — Dexamethasone Sodium Phosphate Inj 100 MG/10ML: INTRAMUSCULAR | Qty: 1 | Status: AC

## 2022-08-12 MED FILL — Fosaprepitant Dimeglumine For IV Infusion 150 MG (Base Eq): INTRAVENOUS | Qty: 5 | Status: AC

## 2022-08-13 ENCOUNTER — Inpatient Hospital Stay: Payer: Medicare Other

## 2022-08-13 ENCOUNTER — Ambulatory Visit: Payer: Medicare Other | Admitting: Dietician

## 2022-08-13 ENCOUNTER — Ambulatory Visit: Payer: Medicare Other | Admitting: Physician Assistant

## 2022-08-13 ENCOUNTER — Ambulatory Visit: Payer: Medicare Other

## 2022-08-13 ENCOUNTER — Inpatient Hospital Stay (HOSPITAL_BASED_OUTPATIENT_CLINIC_OR_DEPARTMENT_OTHER): Payer: Medicare Other | Admitting: Hematology and Oncology

## 2022-08-13 ENCOUNTER — Other Ambulatory Visit: Payer: Medicare Other

## 2022-08-13 VITALS — BP 120/60 | HR 75 | Temp 97.9°F | Resp 18 | Wt 264.8 lb

## 2022-08-13 DIAGNOSIS — C8330 Diffuse large B-cell lymphoma, unspecified site: Secondary | ICD-10-CM | POA: Diagnosis not present

## 2022-08-13 DIAGNOSIS — C8338 Diffuse large B-cell lymphoma, lymph nodes of multiple sites: Secondary | ICD-10-CM

## 2022-08-13 DIAGNOSIS — Z95828 Presence of other vascular implants and grafts: Secondary | ICD-10-CM

## 2022-08-13 DIAGNOSIS — E1122 Type 2 diabetes mellitus with diabetic chronic kidney disease: Secondary | ICD-10-CM | POA: Diagnosis not present

## 2022-08-13 DIAGNOSIS — D509 Iron deficiency anemia, unspecified: Secondary | ICD-10-CM | POA: Diagnosis not present

## 2022-08-13 DIAGNOSIS — A419 Sepsis, unspecified organism: Secondary | ICD-10-CM | POA: Diagnosis not present

## 2022-08-13 DIAGNOSIS — N179 Acute kidney failure, unspecified: Secondary | ICD-10-CM | POA: Diagnosis not present

## 2022-08-13 DIAGNOSIS — R7989 Other specified abnormal findings of blood chemistry: Secondary | ICD-10-CM | POA: Diagnosis not present

## 2022-08-13 DIAGNOSIS — D72829 Elevated white blood cell count, unspecified: Secondary | ICD-10-CM | POA: Diagnosis not present

## 2022-08-13 DIAGNOSIS — C8335 Diffuse large B-cell lymphoma, lymph nodes of inguinal region and lower limb: Secondary | ICD-10-CM | POA: Diagnosis not present

## 2022-08-13 DIAGNOSIS — Z5112 Encounter for antineoplastic immunotherapy: Secondary | ICD-10-CM | POA: Diagnosis not present

## 2022-08-13 DIAGNOSIS — N1831 Chronic kidney disease, stage 3a: Secondary | ICD-10-CM | POA: Diagnosis not present

## 2022-08-13 DIAGNOSIS — Z5111 Encounter for antineoplastic chemotherapy: Secondary | ICD-10-CM | POA: Diagnosis not present

## 2022-08-13 DIAGNOSIS — L02214 Cutaneous abscess of groin: Secondary | ICD-10-CM | POA: Diagnosis not present

## 2022-08-13 LAB — CBC WITH DIFFERENTIAL (CANCER CENTER ONLY)
Abs Immature Granulocytes: 0.21 10*3/uL — ABNORMAL HIGH (ref 0.00–0.07)
Basophils Absolute: 0.1 10*3/uL (ref 0.0–0.1)
Basophils Relative: 1 %
Eosinophils Absolute: 0.1 10*3/uL (ref 0.0–0.5)
Eosinophils Relative: 2 %
HCT: 32.8 % — ABNORMAL LOW (ref 39.0–52.0)
Hemoglobin: 10.2 g/dL — ABNORMAL LOW (ref 13.0–17.0)
Immature Granulocytes: 4 %
Lymphocytes Relative: 17 %
Lymphs Abs: 1.1 10*3/uL (ref 0.7–4.0)
MCH: 26.6 pg (ref 26.0–34.0)
MCHC: 31.1 g/dL (ref 30.0–36.0)
MCV: 85.4 fL (ref 80.0–100.0)
Monocytes Absolute: 0.5 10*3/uL (ref 0.1–1.0)
Monocytes Relative: 8 %
Neutro Abs: 4.2 10*3/uL (ref 1.7–7.7)
Neutrophils Relative %: 68 %
Platelet Count: 244 10*3/uL (ref 150–400)
RBC: 3.84 MIL/uL — ABNORMAL LOW (ref 4.22–5.81)
RDW: 19.4 % — ABNORMAL HIGH (ref 11.5–15.5)
WBC Count: 6.1 10*3/uL (ref 4.0–10.5)
nRBC: 0 % (ref 0.0–0.2)

## 2022-08-13 LAB — CMP (CANCER CENTER ONLY)
ALT: 30 U/L (ref 0–44)
AST: 24 U/L (ref 15–41)
Albumin: 4 g/dL (ref 3.5–5.0)
Alkaline Phosphatase: 61 U/L (ref 38–126)
Anion gap: 8 (ref 5–15)
BUN: 12 mg/dL (ref 8–23)
CO2: 27 mmol/L (ref 22–32)
Calcium: 9.3 mg/dL (ref 8.9–10.3)
Chloride: 108 mmol/L (ref 98–111)
Creatinine: 1.08 mg/dL (ref 0.61–1.24)
GFR, Estimated: >60 mL/min (ref >60)
Glucose, Bld: 256 mg/dL — ABNORMAL HIGH (ref 70–99)
Potassium: 4.4 mmol/L (ref 3.5–5.1)
Sodium: 143 mmol/L (ref 135–145)
Total Bilirubin: 0.5 mg/dL (ref 0.3–1.2)
Total Protein: 6.7 g/dL (ref 6.5–8.1)

## 2022-08-13 LAB — LACTATE DEHYDROGENASE: LDH: 403 U/L — ABNORMAL HIGH (ref 98–192)

## 2022-08-13 LAB — URIC ACID: Uric Acid, Serum: 3.3 mg/dL — ABNORMAL LOW (ref 3.7–8.6)

## 2022-08-13 MED ORDER — ACETAMINOPHEN 325 MG PO TABS
650.0000 mg | ORAL_TABLET | Freq: Once | ORAL | Status: AC
  Administered 2022-08-13: 650 mg via ORAL
  Filled 2022-08-13: qty 2

## 2022-08-13 MED ORDER — SODIUM CHLORIDE 0.9 % IV SOLN
750.0000 mg/m2 | Freq: Once | INTRAVENOUS | Status: AC
  Administered 2022-08-13: 2000 mg via INTRAVENOUS
  Filled 2022-08-13: qty 100

## 2022-08-13 MED ORDER — SODIUM CHLORIDE 0.9% FLUSH
10.0000 mL | INTRAVENOUS | Status: DC | PRN
  Administered 2022-08-13: 10 mL

## 2022-08-13 MED ORDER — DOXORUBICIN HCL CHEMO IV INJECTION 2 MG/ML
50.0000 mg/m2 | Freq: Once | INTRAVENOUS | Status: AC
  Administered 2022-08-13: 132 mg via INTRAVENOUS
  Filled 2022-08-13: qty 66

## 2022-08-13 MED ORDER — PALONOSETRON HCL INJECTION 0.25 MG/5ML
0.2500 mg | Freq: Once | INTRAVENOUS | Status: AC
  Administered 2022-08-13: 0.25 mg via INTRAVENOUS
  Filled 2022-08-13: qty 5

## 2022-08-13 MED ORDER — FAMOTIDINE IN NACL 20-0.9 MG/50ML-% IV SOLN
20.0000 mg | Freq: Once | INTRAVENOUS | Status: AC
  Administered 2022-08-13: 20 mg via INTRAVENOUS
  Filled 2022-08-13: qty 50

## 2022-08-13 MED ORDER — SODIUM CHLORIDE 0.9 % IV SOLN: Freq: Once | INTRAVENOUS | Status: AC

## 2022-08-13 MED ORDER — SODIUM CHLORIDE 0.9 % IV SOLN
375.0000 mg/m2 | Freq: Once | INTRAVENOUS | Status: AC
  Administered 2022-08-13: 1000 mg via INTRAVENOUS
  Filled 2022-08-13: qty 100

## 2022-08-13 MED ORDER — DIPHENHYDRAMINE HCL 25 MG PO CAPS
50.0000 mg | ORAL_CAPSULE | Freq: Once | ORAL | Status: AC
  Administered 2022-08-13: 50 mg via ORAL
  Filled 2022-08-13: qty 2

## 2022-08-13 MED ORDER — VINCRISTINE SULFATE CHEMO INJECTION 1 MG/ML
2.0000 mg | Freq: Once | INTRAVENOUS | Status: AC
  Administered 2022-08-13: 2 mg via INTRAVENOUS
  Filled 2022-08-13: qty 2

## 2022-08-13 MED ORDER — SODIUM CHLORIDE 0.9 % IV SOLN
10.0000 mg | Freq: Once | INTRAVENOUS | Status: AC
  Administered 2022-08-13: 10 mg via INTRAVENOUS
  Filled 2022-08-13: qty 10

## 2022-08-13 MED ORDER — MONTELUKAST SODIUM 10 MG PO TABS
10.0000 mg | ORAL_TABLET | Freq: Once | ORAL | Status: AC
  Administered 2022-08-13: 10 mg via ORAL
  Filled 2022-08-13: qty 1

## 2022-08-13 MED ORDER — HEPARIN SOD (PORK) LOCK FLUSH 100 UNIT/ML IV SOLN
500.0000 [IU] | Freq: Once | INTRAVENOUS | Status: AC | PRN
  Administered 2022-08-13: 500 [IU]

## 2022-08-13 MED ORDER — SODIUM CHLORIDE 0.9% FLUSH
10.0000 mL | Freq: Once | INTRAVENOUS | Status: AC
  Administered 2022-08-13: 10 mL

## 2022-08-13 MED ORDER — SODIUM CHLORIDE 0.9 % IV SOLN
150.0000 mg | Freq: Once | INTRAVENOUS | Status: AC
  Administered 2022-08-13: 150 mg via INTRAVENOUS
  Filled 2022-08-13: qty 150

## 2022-08-13 NOTE — Progress Notes (Signed)
[facility] [phone]   [phone]  PROGRESS NOTE  Patient Care Team: Alex Alcide, MD as PCP - General Branch, Alex Pea, MD as PCP - Cardiology (Cardiology) Alex Kayser, MD as Consulting Physician (Endocrinology) Alex Daily, RN as [facility] Management  Hematological/Oncological History # Diffuse Large B Cell Lymphoma, Stage III 03/16/2022: establish care with Dr. Leonides Cortez [facility] 04/14/2022: excisional lymph node biopsy of right inguinal node shows DLBCL arising from follicular lymphoma 04/21/2022: PET CT scan showed widespread hypermetabolic lymphadenopathy in the neck, chest, abdomen, and pelvis 05/06/2021: Defer Cycle 1 Day 1 of R-CHOP chemotherapy. Patient has wound dehiscence and possible infection.  Seen by surgery same day. 05/14/2022: Cycle 1 Day 1 of R-CHOP chemotherapy 06/03/2022: Cycle 2 Day 1 of R-CHOP chemotherapy 06/25/2022: Cycle 3 Day 1 of R-CHOP chemotherapy 07/16/2022: Cycle 4, Day 1 of R-CHOP chemotherapy HELD due to healing right groin abscess 07/23/2022: Cycle 4, Day 1 of R-CHOP 08/13/2022: Cycle 5, Day 1 of R-CHOP  Interval History:  Alex Cortez 72 y.o. male with medical history significant for newly diagnosed diffuse large B-cell lymphoma who presents for a follow up visit. The patient's last visit was on 07/23/2022. In the interim since the last visit he completed Cycle 4 and had an interval PET which showed good response to treatment.   On exam today Alex Cortez is accompanied by his wife.  He reports his last infusion went well.  He was not complicated by any nausea, vomiting, or diarrhea.  He has lost 9 pounds in the interim since her last visit.  He reports his appetite has been poor because of the change in his taste buds.  He reports that chicken tasted quite poorly and had a "metallic taste".  He notes he was able to enjoy salad.  Sweet foods have not had the same taste.   He notes he is not having any issues with fevers, chills, sweats.  He notes that he is currently off antibiotics for his infected surgical site.  He notes he is not having any lightheadedness, dizziness, or shortness of breath.  Overall he feels well and is willing and able to proceed with treatment today.  He denies fevers, chills, sweats, chest pain or cough. He has no other complaints. Rest of the 10 point ROS is below.    MEDICAL HISTORY:  Past Medical History:  Diagnosis Date   CAD (coronary artery disease)    nonobstructive CAD/patent LAD stent site; EF 65%, 08/2011; NSTEMI/DES LAD, 6/20 11   Chest pain    Diabetes mellitus    insulin-dependent   Dyslipidemia    HTN (hypertension)    Hypothyroidism    Myocardial infarction (HCC) 2011   Post MI syndrome (HCC) 08/29/2009   Stroke (HCC) 10/22/2012   Left inferior pons    SURGICAL HISTORY: Past Surgical History:  Procedure Laterality Date   COLONOSCOPY  08/2021   CORONARY ANGIOPLASTY WITH STENT PLACEMENT  2011   IR IMAGING GUIDED PORT INSERTION  04/26/2022   LYMPH NODE BIOPSY Right 04/14/2022   Procedure: RIGHT INGUINAL EXCISIONAL LYMPH NODE BIOPSY;  Surgeon: Alex Mandes, MD;  Location: [facility];  Service: General;  Laterality: Right;    SOCIAL HISTORY: Social History   Socioeconomic History   Marital status: Married    Spouse name: Not on file   Number of children: Not on file   Years of education: Not on file   Highest education level:  Not on file  Occupational History   Not on file  Tobacco Use   Smoking status: Former    Packs/day: 0.50    Years: 23.00    Additional pack years: 0.00    Total pack years: 11.50    Types: Cigarettes    Start date: 03/16/1955    Quit date: 03/15/1988    Years since quitting: 34.4   Smokeless tobacco: Never  Vaping Use   Vaping Use: Never used  Substance and Sexual Activity   Alcohol use: No    Alcohol/week: 0.0 standard drinks of alcohol   Drug use: No   Sexual activity: Not on  file  Other Topics Concern   Not on file  Social History Narrative   Works at [employer]. Married.    Social Determinants of Health   Financial Resource Strain: Low Risk  (06/24/2022)   Overall Financial Resource Strain (CARDIA)    Difficulty of Paying Living Expenses: Not very hard  Food Insecurity: No Food Insecurity (07/11/2022)   Hunger Vital Sign    Worried About Running Out of Food in the Last Year: Never true    Ran Out of Food in the Last Year: Never true  Transportation Needs: No Transportation Needs (07/11/2022)   PRAPARE - Administrator, Civil Service (Medical): No    Lack of Transportation (Non-Medical): No  Physical Activity: Inactive (05/24/2022)   Exercise Vital Sign    Days of Exercise per Week: 0 days    Minutes of Exercise per Session: 0 min  Stress: No Stress Concern Present (04/07/2022)   [employer] of Occupational Health - Occupational Stress Questionnaire    Feeling of Stress : Not at all  Social Connections: Unknown (07/31/2017)   Social Connection and Isolation Panel [NHANES]    Frequency of Communication with Friends and Family: Patient declined    Frequency of Social Gatherings with Friends and Family: Patient declined    Attends Religious Services: Patient declined    Database administrator or Organizations: Patient declined    Attends Banker Meetings: Patient declined    Marital Status: Patient declined  Intimate Partner Violence: Not At Risk (07/11/2022)   Humiliation, Afraid, Rape, and Kick questionnaire    Fear of Current or Ex-Partner: No    Emotionally Abused: No    Physically Abused: No    Sexually Abused: No    FAMILY HISTORY: Family History  Problem Relation Age of Onset   Colon cancer Mother    Hypertension Other        in all family members    ALLERGIES:  is allergic to ace inhibitors, rituxan [rituximab], and shrimp [shellfish allergy].  MEDICATIONS:  Current Outpatient Medications  Medication Sig  Dispense Refill   allopurinol (ZYLOPRIM) 300 MG tablet Take 1 tablet (300 mg total) by mouth Cortez. 90 tablet 1   amLODipine (NORVASC) 10 MG tablet Take 10 mg by mouth Cortez.     carvedilol (COREG) 25 MG tablet Take 1 tablet (25 mg total) by mouth 2 (two) times Cortez. 180 tablet 3   clopidogrel (PLAVIX) 75 MG tablet Take 1 tablet (75 mg total) by mouth Cortez. 30 tablet 5   Continuous Blood Gluc Receiver (FREESTYLE LIBRE 2 READER) DEVI As directed 1 each 0   Continuous Blood Gluc Sensor (FREESTYLE LIBRE 2 SENSOR) MISC APPLY 1 SENSOR EVERY 14    DAYS 6 each 0   dapagliflozin propanediol (FARXIGA) 5 MG TABS tablet TAKE 1 TABLET BY MOUTH  EVERY DAY BEFORE BREAKFAST (Patient taking differently: Take 5 mg by mouth Cortez before breakfast.) 90 tablet 0   ezetimibe (ZETIA) 10 MG tablet TAKE 1 TABLET BY MOUTH EVERY DAY 90 tablet 2   fluticasone (FLONASE) 50 MCG/ACT nasal spray Place 2 sprays into both nostrils Cortez as needed for allergies.     insulin regular human CONCENTRATED (HUMULIN R U-500 KWIKPEN) 500 UNIT/ML KwikPen INJECT AS DIRECTED 70 UNITS WITH BREAKFAST, 70 UNITS WITH LUNCH, AND 70 UNITS WITH SUPPER FOR PRE-MEAL BLOOD GLUCOSE READINGS OF 90MG /DL OR ABOVE 45 mL 0   isosorbide mononitrate (IMDUR) 30 MG 24 hr tablet TAKE 1 TABLET BY MOUTH EVERY DAY (Patient taking differently: Take 30 mg by mouth Cortez.) 90 tablet 1   levothyroxine (SYNTHROID) 75 MCG tablet TAKE 1 TABLET BY MOUTH EVERY DAY BEFORE BREAKFAST (Patient taking differently: Take 75 mcg by mouth Cortez before breakfast.) 90 tablet 0   lidocaine-prilocaine (EMLA) cream Apply 1 Application topically as needed. (Patient taking differently: Apply 1 Application topically Cortez as needed (port access).) 30 g 0   losartan (COZAAR) 50 MG tablet Take 1 tablet (50 mg total) by mouth Cortez.     metFORMIN (GLUCOPHAGE) 500 MG tablet Take 1 tablet (500 mg total) by mouth Cortez with breakfast. 90 tablet 1   nitroGLYCERIN (NITROSTAT) 0.4 MG SL tablet Place 1  tablet (0.4 mg total) under the tongue every 5 (five) minutes as needed. (Patient not taking: Reported on 07/11/2022) 25 tablet 3   ondansetron (ZOFRAN) 8 MG tablet Take 1 tablet (8 mg total) by mouth every 8 (eight) hours as needed. (Patient taking differently: Take 8 mg by mouth every 8 (eight) hours as needed for vomiting, nausea or refractory nausea / vomiting.) 30 tablet 0   ONE TOUCH ULTRA TEST test strip USE TO TEST BLOOD SUGAR FOUR TIMES Cortez E11.65 150 each 5   predniSONE (DELTASONE) 10 MG tablet Take 6 tablets (60 mg total) by mouth Cortez with breakfast. Take 6 tablets on Day 1 of chemotherapy and continue for 5 days. Repeat with each chemotherapy cycle. (Patient not taking: Reported on 08/10/2022) 30 tablet 5   predniSONE (DELTASONE) 20 MG tablet Take 60 mg by mouth See admin instructions. 60 mg once Cortez for 5 days, starting on day 1 of chemotherapy. Repeat with each chemotherapy cycle.     prochlorperazine (COMPAZINE) 10 MG tablet Take 1 tablet (10 mg total) by mouth every 6 (six) hours as needed for nausea or vomiting. 30 tablet 0   rosuvastatin (CRESTOR) 40 MG tablet TAKE 1 TABLET BY MOUTH EVERY DAY 90 tablet 2   No current facility-administered medications for this visit.   Facility-Administered Medications Ordered in Other Visits  Medication Dose Route Frequency Provider Last Rate Last Admin   cyclophosphamide (CYTOXAN) 2,000 mg in sodium chloride 0.9 % 500 mL chemo infusion  750 mg/m2 (Treatment Plan Recorded) Intravenous Once Jaci Standard, MD       dexamethasone (DECADRON) 10 mg in sodium chloride 0.9 % 50 mL IVPB  10 mg Intravenous Once Ulysees Barns IV, MD       DOXOrubicin (ADRIAMYCIN) chemo injection 132 mg  50 mg/m2 (Treatment Plan Recorded) Intravenous Once Jaci Standard, MD       famotidine (PEPCID) IVPB 20 mg premix  20 mg Intravenous Once Ulysees Barns IV, MD       fosaprepitant (EMEND) 150 mg in sodium chloride 0.9 % 145 mL IVPB  150 mg Intravenous Once [place],  Thereasa Distance, MD       heparin lock flush 100 unit/mL  500 Units Intracatheter Once PRN Jaci Standard, MD       montelukast (SINGULAIR) tablet 10 mg  10 mg Oral Once Jaci Standard, MD       palonosetron (ALOXI) injection 0.25 mg  0.25 mg Intravenous Once Jaci Standard, MD       riTUXimab-pvvr (RUXIENCE) 1,000 mg in sodium chloride 0.9 % 250 mL (2.8571 mg/mL) infusion  375 mg/m2 (Treatment Plan Recorded) Intravenous Once Jaci Standard, MD       sodium chloride flush (NS) 0.9 % injection 10 mL  10 mL Intracatheter PRN Jaci Standard, MD       vinCRIStine (ONCOVIN) 2 mg in sodium chloride 0.9 % 50 mL chemo infusion  2 mg Intravenous Once Jaci Standard, MD        REVIEW OF SYSTEMS:   Constitutional: ( - ) fevers, ( - )  chills , ( - ) night sweats Eyes: ( - ) blurriness of vision, ( - ) double vision, ( - ) watery eyes Ears, nose, mouth, throat, and face: ( - ) mucositis, ( - ) sore throat Respiratory: ( - ) cough, ( + ) dyspnea, ( - ) wheezes Cardiovascular: ( - ) palpitation, ( - ) chest discomfort, ( - ) lower extremity swelling Gastrointestinal:  ( - ) nausea, ( - ) heartburn, ( - ) change in bowel habits Skin: ( - ) abnormal skin rashes Lymphatics: ( - ) new lymphadenopathy, ( - ) easy bruising Neurological: ( + ) numbness, ( - ) tingling, ( - ) new weaknesses Behavioral/Psych: ( - ) mood change, ( - ) new changes  All other systems were reviewed with the patient and are negative.  PHYSICAL EXAMINATION: ECOG PERFORMANCE STATUS: 1 - Symptomatic but completely ambulatory  There were no vitals filed for this visit.  There were no vitals filed for this visit.   Day 1, Cycle 4 07/23/22  Height 6\' 3"  (1.905 m)  Weight 276 lb 12.8 oz (125.6 kg)  BSA (Calculated - sq m) 2.58 sq meters  Temp 98.2 F (36.8 C)  Temp src Oral  Pulse 77  Resp 20  BP 109/57 !     GENERAL: Well-appearing elderly African-American male alert, no distress and comfortable SKIN: skin  color, texture, turgor are normal, no rashes or significant lesions. EYES: conjunctiva are pink and non-injected, sclera clear LUNGS: clear to auscultation and percussion with normal breathing effort HEART: regular rate & rhythm and no murmurs and no lower extremity edema Musculoskeletal: no cyanosis of digits and no clubbing  PSYCH: alert & oriented x 3, fluent speech NEURO: no focal motor/sensory deficits  LABORATORY DATA:  I have reviewed the data as listed    Latest Ref Rng & Units 08/13/2022    7:29 AM 07/23/2022    9:12 AM 07/16/2022    9:55 AM  CBC  WBC 4.0 - 10.5 K/uL 6.1  5.6  5.6   Hemoglobin 13.0 - 17.0 g/dL 16.1  09.6  04.5   Hematocrit 39.0 - 52.0 % 32.8  33.4  30.8   Platelets 150 - 400 K/uL 244  323  292        Latest Ref Rng & Units 08/13/2022    7:29 AM 07/23/2022    9:12 AM 07/16/2022    9:55 AM  CMP  Glucose 70 - 99 mg/dL  256  163  237   BUN 8 - 23 mg/dL 12  11  12    Creatinine 0.61 - 1.24 mg/dL 1.19  1.47  8.29   Sodium 135 - 145 mmol/L 143  141  140   Potassium 3.5 - 5.1 mmol/L 4.4  4.2  4.2   Chloride 98 - 111 mmol/L 108  109  109   CO2 22 - 32 mmol/L 27  27  27    Calcium 8.9 - 10.3 mg/dL 9.3  8.9  8.8   Total Protein 6.5 - 8.1 g/dL 6.7  6.7  6.5   Total Bilirubin 0.3 - 1.2 mg/dL 0.5  0.5  0.3   Alkaline Phos 38 - 126 U/L 61  58  63   AST 15 - 41 U/L 24  23  69   ALT 0 - 44 U/L 30  43  140     RADIOGRAPHIC STUDIES: I have personally reviewed the radiological images as listed and agreed with the findings in the report: Diffuse involving the lymph nodes in the neck, underarms, chest, abdomen and groin. NM PET Image Restag (PS) Skull Base To Thigh  Result Date: 07/26/2022 CLINICAL DATA:  Subsequent treatment strategy for large B-cell lymphoma. EXAM: NUCLEAR MEDICINE PET SKULL BASE TO THIGH TECHNIQUE: 13.8 mCi F-18 FDG was injected intravenously. Full-ring PET imaging was performed from the skull base to thigh after the radiotracer. CT data was obtained and  used for attenuation correction and anatomic localization. Fasting blood glucose: 123 mg/dl COMPARISON:  56/21/3086. FINDINGS: Mediastinal blood pool activity: SUV max 2.4 Liver activity: SUV max 3.6 NECK: No hypermetabolic lymph nodes. Incidental CT findings: None. CHEST: No abnormal hypermetabolism. Incidental CT findings: Right IJ Port-A-Cath terminates in the right atrium. Atherosclerotic calcification of the aorta, aortic valve and coronary arteries. Heart is enlarged. No pericardial or pleural effusion. 7 mm right lower lobe nodule unchanged. ABDOMEN/PELVIS: Residual periaortic ill-defined adenopathy measures up to approximately 1.4 cm in the retrocaval station (4/135), SUV max 3.0. Residual ill-defined nodal soft tissue thickening in the central small bowel mesentery measures up to 1.5 cm (4/141), SUV 2.0. Residual pelvic retroperitoneal lymph nodes measure up to 2.1 cm in the right external iliac chain (4/178), SUV max 3.5. Peripherally hypermetabolic fluid collection in the right groin measures 3.3 x 3.3 cm SUV max 4.1. Residual inguinal lymph nodes with index right lymph node measuring 12 mm (4/194), SUV max 2.5. No additional abnormal hypermetabolism. Incidental CT findings: Liver, gallbladder, adrenal glands and right kidney are unremarkable. 1.5 cm exophytic lesion off posterior left kidney too small to characterize. No specific follow-up necessary. Spleen, pancreas stomach and bowel are grossly unremarkable. Atherosclerotic calcification of the aorta. SKELETON: Mild diffuse osseous metabolism without focality. Incidental CT findings: Degenerative changes in the spine. IMPRESSION: 1. Interval response to therapy with residual borderline to minimally hypermetabolic adenopathy in the abdomen and pelvis. 2. Probable postoperative seroma in the right groin. 3. 7 mm right lower lobe nodule, stable. Recommend continued attention on follow-up. 4. Mild diffuse osseous hypermetabolism, likely treatment related.  5.  Aortic atherosclerosis (ICD10-I70.0). Electronically Signed   By: Leanna Battles M.D.   On: 07/26/2022 09:40    ASSESSMENT & PLAN ATHA ELIZALDE 72 y.o. male with medical history significant for newly diagnosed diffuse large B-cell lymphoma who presents for a follow up visit.  At this time findings are most consistent with diffuse large B-cell lymphoma stage III.  The difference tween stage III and stage IV is academic as there  is no difference in treatment.  We will plan to proceed with 6 cycles of R-CHOP chemotherapy.  Chemotherapy will be administered every 3 weeks with G-CSF therapy to be provided on day 3.  Patient will be administered chemotherapy of full-strength.  His echocardiogram shows good baseline cardiac function.  Also his labs are strong at baseline.  The patient voices understanding of the chemotherapy and the plan moving forward.  # Diffuse Large B Cell Lymphoma Stage III/IV -- At this time findings are most consistent with a diffuse large B-cell lymphoma arising from follicular lymphoma, stage III/IV.  Would favor stage 3 as there is no clear evidence of organ involvement, may have bone marrow involvement. -- Will proceed with R-CHOP chemotherapy.  Discussed the risks and benefits of this therapy as well as the anticipated side effects and scheduling. -- Cycle 1 Day 1 of R-CHOP chemotherapy on 05/14/2022 Plan: -- Due for Cycle 5, Day 1 today. --Labs today show white blood cell count 6.1, hemoglobin 10.2, MCV 85.4, and platelets of 244. Creatinine and LFTs normal.  --Proceed with treatment today since Dr. Freida Busman gave clearance to resume chemotherapy yesterday.  --Mid treatment PET scan was done yesterday, pending final report.  --RTC in 3 weeks for Cycle 6 Day 1 of treatment   #Microcytic anemia: --Labs today show Hgb 10.2, improving --Iron panel checked on 05/12/2022 without deficiency.   # Wound Dehiscence/Wound Infection--recurrence --Completed antibiotic therapy with  Bactrim. --Was evaluated by surgical team on 05/10/2022 with improvement of wound. They gave clearance to start chemotherapy scheduled for 05/14/2022.  --Readmitted 07/11/2022-07/14/2022 right groin cellulitis/abscess. He underwent I&D on 4/29 with culture that demonstrated growth of staph aureus that was pansensitivity. He was transition to oral antibiotics for 7 days with bactrim at discharge. --Had follow up with Dr. Freida Busman on 07/22/2022. Gave clearance to resume chemotherapy today. Patient was advised to gently pack the opening with a small piece of gauze for about 3 days and then he no longer needs to pack it with hopes that it will close up quickly.   #Supportive Care -- chemotherapy education complete -- port placed -- zofran 8mg  q8H PRN and compazine 10mg  PO q6H for nausea -- allopurinol 300mg  PO Cortez for TLS prophylaxis -- EMLA cream for port -- no pain medication required at this time.   No orders of the defined types were placed in this encounter.  All questions were answered. The patient knows to call the clinic with any problems, questions or concerns.  A total of more than 30 minutes were spent on this encounter with face-to-face time and non-face-to-face time, including preparing to see the patient, ordering tests and/or medications, counseling the patient and coordination of care as outlined above.   Ulysees Barns, MD Department of Hematology/Oncology [facility] Phone: [phone] Pager: [phone] Email: [email or web address]  08/13/2022 8:45 AM

## 2022-08-13 NOTE — Patient Instructions (Signed)
[facility]  Discharge Instructions: Thank you for choosing [facility] to provide your oncology and hematology care.   If you have a lab appointment with the Cancer Center, please go directly to the Cancer Center and check in at the registration area.   Wear comfortable clothing and clothing appropriate for easy access to any Portacath or PICC line.   We strive to give you quality time with your provider. You may need to reschedule your appointment if you arrive late (15 or more minutes).  Arriving late affects you and other patients whose appointments are after yours.  Also, if you miss three or more appointments without notifying the office, you may be dismissed from the clinic at the provider's discretion.      For prescription refill requests, have your pharmacy contact our office and allow 72 hours for refills to be completed.    Today you received the following chemotherapy and/or immunotherapy agents : Adriamycin, Vincristine, Cytoxan, Rituxan      To help prevent nausea and vomiting after your treatment, we encourage you to take your nausea medication as directed.  BELOW ARE SYMPTOMS THAT SHOULD BE REPORTED IMMEDIATELY: *FEVER GREATER THAN 100.4 F (38 C) OR HIGHER *CHILLS OR SWEATING *NAUSEA AND VOMITING THAT IS NOT CONTROLLED WITH YOUR NAUSEA MEDICATION *UNUSUAL SHORTNESS OF BREATH *UNUSUAL BRUISING OR BLEEDING *URINARY PROBLEMS (pain or burning when urinating, or frequent urination) *BOWEL PROBLEMS (unusual diarrhea, constipation, pain near the anus) TENDERNESS IN MOUTH AND THROAT WITH OR WITHOUT PRESENCE OF ULCERS (sore throat, sores in mouth, or a toothache) UNUSUAL RASH, SWELLING OR PAIN  UNUSUAL VAGINAL DISCHARGE OR ITCHING   Items with * indicate a potential emergency and should be followed up as soon as possible or go to the Emergency Department if any problems should occur.  Please show the CHEMOTHERAPY ALERT CARD or  IMMUNOTHERAPY ALERT CARD at check-in to the Emergency Department and triage nurse.  Should you have questions after your visit or need to cancel or reschedule your appointment, please contact [facility]  Dept: [phone]  and follow the prompts.  Office hours are 8:00 a.m. to 4:30 p.m. Monday - Friday. Please note that voicemails left after 4:00 p.m. may not be returned until the following business day.  We are closed weekends and major holidays. You have access to a nurse at all times for urgent questions. Please call the main number to the clinic Dept: [phone] and follow the prompts.   For any non-urgent questions, you may also contact your provider using MyChart. We now offer e-Visits for anyone 18 and older to request care online for non-urgent symptoms. For details visit [email or web address].   Also download the MyChart app! Go to the app store, search "MyChart", open the app, select [facility], and log in with your MyChart username and password.   

## 2022-08-13 NOTE — Progress Notes (Signed)
Nutrition Assessment   Reason for Assessment: RN request, wt loss   ASSESSMENT: 72 year old male with stage III large B Cell Lymphoma. He is receiving R-CHOP q21d (first 3/1). Patient is under the care of Dr. Leonides Schanz.  Past medical history includes IDDM2, diastolic heart failure, CVA with residual effects, CAD, HTN, OSA  Met with patient in infusion. He reports decreased appetite and intake secondary to taste changes. Patient has stopped eating chips, chicken, and drinking soda because they taste bad. He says taste returns a few days before returning for the next therapy. Patient tries to eat 3 meals daily, but often times will skip lunch. Recalls chef salad, jello, and pudding during the day. Patient reports eating a bowl of cereal before bed last night so his sugar would not drop too low. He keeps a fruit cup by his bed to eat in the middle of night in case his sugar drops which happens frequently. Patient is drinking 2 bottles of water. He denies nausea, vomiting, diarrhea, constipation.    Nutrition Focused Physical Exam: deferred   Medications: farxiga, imdur, synthroid, cozaar, metformin, zofran, compazine, crestor   Labs: glucose 256 Last HgbA1c 8.5 - 07/11/22  Anthropometrics: Weights have decreased 4% (12 lbs) from 276 lb 12.8 oz on 5/10 - this is severe for time frame  Height: 6'3" Weight: 264 lb 12 oz UBW: 280-285 lb (per pt) BMI: 33.09    NUTRITION DIAGNOSIS: Unintended weight loss related to side effects of chemotherapy as evidenced by reported altered taste of foods, 4% wt loss in 3 weeks which is significant for time frame   INTERVENTION:  Educated on importance of adequate calorie and protein energy intake to maintain strength/weights during treatment Discussed strategies for altered taste, suggested baking soda salt water rinses several times daily and before meals - handout with tips + recipe provided Encouraged smaller more frequent meals/snacks vs 3 larger meals  daily Discussed protein sources, encouraged protein foods at every meal - handouts with snack ideas + list of foods provided Suggested patient try spoonful of peanut butter before bed vs bowl of cereal - handout with tips for blood glucose management provided Suggested daily oral nutrition supplement for added calories/protein - samples of Glucerna 1.5 given for pt to try Patient will work to increase intake of water Contact information given    MONITORING, EVALUATION, GOAL: Patient will tolerate increased calories and protein to minimize further weight loss during treatment   Next Visit: To be scheduled as needed with treatment

## 2022-08-16 ENCOUNTER — Inpatient Hospital Stay: Payer: Medicare Other | Attending: Physician Assistant

## 2022-08-16 ENCOUNTER — Other Ambulatory Visit: Payer: Self-pay

## 2022-08-16 VITALS — BP 115/55 | HR 80 | Temp 98.3°F | Resp 20

## 2022-08-16 DIAGNOSIS — L02214 Cutaneous abscess of groin: Secondary | ICD-10-CM | POA: Diagnosis not present

## 2022-08-16 DIAGNOSIS — Z87891 Personal history of nicotine dependence: Secondary | ICD-10-CM | POA: Insufficient documentation

## 2022-08-16 DIAGNOSIS — D509 Iron deficiency anemia, unspecified: Secondary | ICD-10-CM | POA: Diagnosis not present

## 2022-08-16 DIAGNOSIS — Z5112 Encounter for antineoplastic immunotherapy: Secondary | ICD-10-CM | POA: Diagnosis not present

## 2022-08-16 DIAGNOSIS — Z5189 Encounter for other specified aftercare: Secondary | ICD-10-CM | POA: Diagnosis not present

## 2022-08-16 DIAGNOSIS — I5030 Unspecified diastolic (congestive) heart failure: Secondary | ICD-10-CM | POA: Diagnosis not present

## 2022-08-16 DIAGNOSIS — Z5111 Encounter for antineoplastic chemotherapy: Secondary | ICD-10-CM | POA: Diagnosis not present

## 2022-08-16 DIAGNOSIS — Z79899 Other long term (current) drug therapy: Secondary | ICD-10-CM | POA: Insufficient documentation

## 2022-08-16 DIAGNOSIS — C8338 Diffuse large B-cell lymphoma, lymph nodes of multiple sites: Secondary | ICD-10-CM | POA: Diagnosis not present

## 2022-08-16 DIAGNOSIS — I110 Hypertensive heart disease with heart failure: Secondary | ICD-10-CM | POA: Diagnosis not present

## 2022-08-16 DIAGNOSIS — L03314 Cellulitis of groin: Secondary | ICD-10-CM | POA: Diagnosis not present

## 2022-08-16 DIAGNOSIS — B9562 Methicillin resistant Staphylococcus aureus infection as the cause of diseases classified elsewhere: Secondary | ICD-10-CM | POA: Diagnosis not present

## 2022-08-16 DIAGNOSIS — E1151 Type 2 diabetes mellitus with diabetic peripheral angiopathy without gangrene: Secondary | ICD-10-CM | POA: Diagnosis not present

## 2022-08-16 MED ORDER — PEGFILGRASTIM-PBBK 6 MG/0.6ML ~~LOC~~ SOSY
6.0000 mg | PREFILLED_SYRINGE | Freq: Once | SUBCUTANEOUS | Status: AC
  Administered 2022-08-16: 6 mg via SUBCUTANEOUS
  Filled 2022-08-16: qty 0.6

## 2022-08-17 ENCOUNTER — Ambulatory Visit: Payer: Self-pay | Admitting: *Deleted

## 2022-08-17 ENCOUNTER — Encounter: Payer: Self-pay | Admitting: *Deleted

## 2022-08-17 NOTE — Patient Outreach (Signed)
Care Coordination   Follow Up Visit Note   08/17/2022 Name: VA CHUKWU MRN: [id number] DOB: 1950-10-11  [name] is a 72 y.o. year old male who sees Burdine, Ananias Pilgrim, MD for primary care. I spoke with  [name] by phone today.  What matters to the patients health and wellness today?  Managing blood pressure and blood sugar and completing chemo treatment for lymphoma     Goals Addressed               This Visit's Progress     Patient Stated     DM management (pt-stated)   On track     Care Coordination Goals: Patient will follow-up with PCP and/or endocrinologist every 3 months or as recommended Patient will take medication as prescribed and reach out to provider with any negative side effects Patient will continue to monitor and record blood sugar 3 times per day and as needed with continuous glucose monitor, and will call PCP or endocrinologist with any readings outside of recommended range Patient will take blood sugar log and meter to provider visits for review Patient will follow a modified carbohydrate diet and decrease simple carbohydrates and sugars Patient will increase activity level as tolerated with an ultimate goal of at least 150 minutes of exercise per week Patient will check feet daily for sores, wounds, calluses, etc and will notify provider of any abnormal findings Patient will have yearly eye exams to check for or monitor diabetic retinopathy Patient will reach out to RN Care Coordinator [phone] with any care coordination or resource needs        Other     Lymphoma Management   On track     Care Coordination Goals: Patient will keep all scheduled medical and imaging appointments Has one more chemo treatment in 3 weeks and then will have PET scan to follow-up Patient will take medications as prescribed Patient will increase activity level as tolerated but will rest as needed Patient will eat a balanced diet to aid in general  health, healing, and energy levels Patient will take medication for nausea as needed Patient will talk with oncology team regarding any new or worsening symptoms Hospital admission on 4/28 for groin abscess resulting in sepsis s/p biopsy. He completed antibiotic therapy and home health and area has healed well. Patient will report any new skin lesions or signs of infection Patient will remain socially engaged with friends/family Patient will reach out to RN Care Coordinator with any care coordination or resource needs [phone]      Manage HTN   On track     Care Coordination Goals: Patient will take medications as directed and report any negative side effects to provider  Patient will use a pill box/organizer to help keep up with when to take medications Patient will monitor and record blood pressure 3 times a week and as needed and will call PCP or specialist with any readings outside of recommended range Patient will keep all recommended follow-up appointments with PCP and specialists (cardiology, nephrology, etc) Patient will take blood pressure log to PCP and specialty appointments for review Patient will follow a low sodium/DASH diet  Patient will reach out to RN Care Coordinator [phone] with any care coordination or resource needs          SDOH assessments and interventions completed:  Yes   SDOH Interventions Today    Flowsheet Row Most Recent Value  SDOH Interventions   Food Insecurity Interventions Intervention  Not Indicated  Housing Interventions Intervention Not Indicated  Transportation Interventions Intervention Not Indicated      Care Coordination Interventions:  Yes, provided  Interventions Today    Flowsheet Row Most Recent Value  Chronic Disease   Chronic disease during today's visit Diabetes, Hypertension (HTN), Other  [Lymphoma]  General Interventions   General Interventions Discussed/Reviewed General Interventions Discussed, General Interventions  Reviewed, Doctor Visits, Durable Medical Equipment (DME)  Doctor Visits Discussed/Reviewed Doctor Visits Discussed, Specialist, PCP, Doctor Visits Reviewed  Annabell Sabal and discussed visit with endocrinologist and hospitalization on 4/28. Last chemo treatment coming up in approx 3 weeks and then will have  PET scan]  Durable Medical Equipment (DME) BP Cuff, Glucomoter  PCP/Specialist Visits Compliance with follow-up visit  Exercise Interventions   Exercise Discussed/Reviewed Physical Activity  Physical Activity Discussed/Reviewed Physical Activity Discussed, Physical Activity Reviewed  Education Interventions   Education Provided Provided Education  Provided Verbal Education On Blood Sugar Monitoring, Exercise, Medication, Mental Health/Coping with Illness, When to see the doctor, Nutrition, Other  [blood pressure monitoring, monitor for any signs of skin infection or other infections]  Nutrition Interventions   Nutrition Discussed/Reviewed Nutrition Reviewed, Nutrition Discussed  Pharmacy Interventions   Pharmacy Dicussed/Reviewed Pharmacy Topics Discussed, Pharmacy Topics Reviewed, Medications and their functions  Safety Interventions   Safety Discussed/Reviewed Safety Discussed, Safety Reviewed      Follow up plan: Follow up call scheduled for 10/06/22    Encounter Outcome:  Pt. Visit Completed   Demetrios Loll, BSN, RN-BC RN Care Coordinator [facility]  [facility] Direct Dial: [phone] Main #: [phone]

## 2022-08-20 DIAGNOSIS — N183 Chronic kidney disease, stage 3 unspecified: Secondary | ICD-10-CM | POA: Diagnosis not present

## 2022-08-20 DIAGNOSIS — E782 Mixed hyperlipidemia: Secondary | ICD-10-CM | POA: Diagnosis not present

## 2022-08-20 DIAGNOSIS — E7849 Other hyperlipidemia: Secondary | ICD-10-CM | POA: Diagnosis not present

## 2022-08-20 DIAGNOSIS — I10 Essential (primary) hypertension: Secondary | ICD-10-CM | POA: Diagnosis not present

## 2022-08-20 DIAGNOSIS — E1165 Type 2 diabetes mellitus with hyperglycemia: Secondary | ICD-10-CM | POA: Diagnosis not present

## 2022-08-20 DIAGNOSIS — E039 Hypothyroidism, unspecified: Secondary | ICD-10-CM | POA: Diagnosis not present

## 2022-08-25 ENCOUNTER — Other Ambulatory Visit: Payer: Self-pay

## 2022-08-25 MED ORDER — HUMULIN R U-500 KWIKPEN 500 UNIT/ML ~~LOC~~ SOPN: PEN_INJECTOR | SUBCUTANEOUS | 0 refills | Status: DC

## 2022-08-26 ENCOUNTER — Other Ambulatory Visit: Payer: Self-pay | Admitting: Hematology and Oncology

## 2022-08-26 ENCOUNTER — Other Ambulatory Visit: Payer: Self-pay | Admitting: Cardiology

## 2022-08-26 ENCOUNTER — Other Ambulatory Visit: Payer: Self-pay | Admitting: "Endocrinology

## 2022-08-27 ENCOUNTER — Telehealth: Payer: Self-pay | Admitting: Hematology and Oncology

## 2022-08-27 ENCOUNTER — Other Ambulatory Visit: Payer: Self-pay | Admitting: Hematology and Oncology

## 2022-08-27 DIAGNOSIS — E7849 Other hyperlipidemia: Secondary | ICD-10-CM | POA: Diagnosis not present

## 2022-08-27 DIAGNOSIS — R7989 Other specified abnormal findings of blood chemistry: Secondary | ICD-10-CM | POA: Diagnosis not present

## 2022-08-27 DIAGNOSIS — R030 Elevated blood-pressure reading, without diagnosis of hypertension: Secondary | ICD-10-CM | POA: Diagnosis not present

## 2022-08-27 DIAGNOSIS — Z6836 Body mass index (BMI) 36.0-36.9, adult: Secondary | ICD-10-CM | POA: Diagnosis not present

## 2022-08-27 DIAGNOSIS — D126 Benign neoplasm of colon, unspecified: Secondary | ICD-10-CM | POA: Diagnosis not present

## 2022-08-27 DIAGNOSIS — E1122 Type 2 diabetes mellitus with diabetic chronic kidney disease: Secondary | ICD-10-CM | POA: Diagnosis not present

## 2022-08-27 DIAGNOSIS — I69851 Hemiplegia and hemiparesis following other cerebrovascular disease affecting right dominant side: Secondary | ICD-10-CM | POA: Diagnosis not present

## 2022-08-27 DIAGNOSIS — E1169 Type 2 diabetes mellitus with other specified complication: Secondary | ICD-10-CM | POA: Diagnosis not present

## 2022-08-27 DIAGNOSIS — I5032 Chronic diastolic (congestive) heart failure: Secondary | ICD-10-CM | POA: Diagnosis not present

## 2022-08-27 DIAGNOSIS — C8330 Diffuse large B-cell lymphoma, unspecified site: Secondary | ICD-10-CM | POA: Diagnosis not present

## 2022-08-27 DIAGNOSIS — D696 Thrombocytopenia, unspecified: Secondary | ICD-10-CM | POA: Diagnosis not present

## 2022-08-27 DIAGNOSIS — D72829 Elevated white blood cell count, unspecified: Secondary | ICD-10-CM | POA: Diagnosis not present

## 2022-08-27 DIAGNOSIS — C8338 Diffuse large B-cell lymphoma, lymph nodes of multiple sites: Secondary | ICD-10-CM

## 2022-08-30 ENCOUNTER — Other Ambulatory Visit: Payer: Self-pay

## 2022-08-30 MED ORDER — HUMULIN R U-500 KWIKPEN 500 UNIT/ML ~~LOC~~ SOPN: PEN_INJECTOR | SUBCUTANEOUS | 0 refills | Status: DC

## 2022-08-30 NOTE — Telephone Encounter (Signed)
Refill sent per patient's request

## 2022-09-02 MED FILL — Fosaprepitant Dimeglumine For IV Infusion 150 MG (Base Eq): INTRAVENOUS | Qty: 5 | Status: AC

## 2022-09-02 MED FILL — Dexamethasone Sodium Phosphate Inj 100 MG/10ML: INTRAMUSCULAR | Qty: 1 | Status: AC

## 2022-09-03 ENCOUNTER — Other Ambulatory Visit: Payer: Self-pay

## 2022-09-03 ENCOUNTER — Inpatient Hospital Stay: Payer: Medicare Other

## 2022-09-03 ENCOUNTER — Inpatient Hospital Stay (HOSPITAL_BASED_OUTPATIENT_CLINIC_OR_DEPARTMENT_OTHER): Payer: Medicare Other | Admitting: Hematology and Oncology

## 2022-09-03 VITALS — BP 106/55 | HR 63 | Temp 97.7°F | Resp 19 | Ht 75.0 in | Wt 271.1 lb

## 2022-09-03 VITALS — BP 121/64 | HR 77 | Temp 98.3°F | Resp 18

## 2022-09-03 DIAGNOSIS — Z87891 Personal history of nicotine dependence: Secondary | ICD-10-CM | POA: Diagnosis not present

## 2022-09-03 DIAGNOSIS — Z5111 Encounter for antineoplastic chemotherapy: Secondary | ICD-10-CM | POA: Diagnosis not present

## 2022-09-03 DIAGNOSIS — Z5112 Encounter for antineoplastic immunotherapy: Secondary | ICD-10-CM | POA: Diagnosis not present

## 2022-09-03 DIAGNOSIS — Z5189 Encounter for other specified aftercare: Secondary | ICD-10-CM | POA: Diagnosis not present

## 2022-09-03 DIAGNOSIS — C8338 Diffuse large B-cell lymphoma, lymph nodes of multiple sites: Secondary | ICD-10-CM

## 2022-09-03 DIAGNOSIS — D509 Iron deficiency anemia, unspecified: Secondary | ICD-10-CM | POA: Diagnosis not present

## 2022-09-03 DIAGNOSIS — Z95828 Presence of other vascular implants and grafts: Secondary | ICD-10-CM | POA: Diagnosis not present

## 2022-09-03 LAB — CBC WITH DIFFERENTIAL (CANCER CENTER ONLY)
Abs Immature Granulocytes: 0.11 10*3/uL — ABNORMAL HIGH (ref 0.00–0.07)
Basophils Absolute: 0.1 10*3/uL (ref 0.0–0.1)
Basophils Relative: 2 %
Eosinophils Absolute: 0.2 10*3/uL (ref 0.0–0.5)
Eosinophils Relative: 2 %
HCT: 30.9 % — ABNORMAL LOW (ref 39.0–52.0)
Hemoglobin: 9.8 g/dL — ABNORMAL LOW (ref 13.0–17.0)
Immature Granulocytes: 2 %
Lymphocytes Relative: 13 %
Lymphs Abs: 0.9 10*3/uL (ref 0.7–4.0)
MCH: 28 pg (ref 26.0–34.0)
MCHC: 31.7 g/dL (ref 30.0–36.0)
MCV: 88.3 fL (ref 80.0–100.0)
Monocytes Absolute: 0.4 10*3/uL (ref 0.1–1.0)
Monocytes Relative: 5 %
Neutro Abs: 5 10*3/uL (ref 1.7–7.7)
Neutrophils Relative %: 76 %
Platelet Count: 258 10*3/uL (ref 150–400)
RBC: 3.5 MIL/uL — ABNORMAL LOW (ref 4.22–5.81)
RDW: 18.6 % — ABNORMAL HIGH (ref 11.5–15.5)
WBC Count: 6.6 10*3/uL (ref 4.0–10.5)
nRBC: 0 % (ref 0.0–0.2)

## 2022-09-03 LAB — CMP (CANCER CENTER ONLY)
ALT: 25 U/L (ref 0–44)
AST: 19 U/L (ref 15–41)
Albumin: 3.8 g/dL (ref 3.5–5.0)
Alkaline Phosphatase: 56 U/L (ref 38–126)
Anion gap: 7 (ref 5–15)
BUN: 16 mg/dL (ref 8–23)
CO2: 27 mmol/L (ref 22–32)
Calcium: 9 mg/dL (ref 8.9–10.3)
Chloride: 106 mmol/L (ref 98–111)
Creatinine: 1.2 mg/dL (ref 0.61–1.24)
GFR, Estimated: >60 mL/min (ref >60)
Glucose, Bld: 328 mg/dL — ABNORMAL HIGH (ref 70–99)
Potassium: 4.2 mmol/L (ref 3.5–5.1)
Sodium: 140 mmol/L (ref 135–145)
Total Bilirubin: 0.5 mg/dL (ref 0.3–1.2)
Total Protein: 6.3 g/dL — ABNORMAL LOW (ref 6.5–8.1)

## 2022-09-03 LAB — LACTATE DEHYDROGENASE: LDH: 344 U/L — ABNORMAL HIGH (ref 98–192)

## 2022-09-03 LAB — URIC ACID: Uric Acid, Serum: 3.3 mg/dL — ABNORMAL LOW (ref 3.7–8.6)

## 2022-09-03 MED ORDER — PALONOSETRON HCL INJECTION 0.25 MG/5ML
0.2500 mg | Freq: Once | INTRAVENOUS | Status: AC
  Administered 2022-09-03: 0.25 mg via INTRAVENOUS
  Filled 2022-09-03: qty 5

## 2022-09-03 MED ORDER — DOXORUBICIN HCL CHEMO IV INJECTION 2 MG/ML
50.0000 mg/m2 | Freq: Once | INTRAVENOUS | Status: AC
  Administered 2022-09-03: 132 mg via INTRAVENOUS
  Filled 2022-09-03: qty 66

## 2022-09-03 MED ORDER — SODIUM CHLORIDE 0.9 % IV SOLN
150.0000 mg | Freq: Once | INTRAVENOUS | Status: AC
  Administered 2022-09-03: 150 mg via INTRAVENOUS
  Filled 2022-09-03: qty 150

## 2022-09-03 MED ORDER — DIPHENHYDRAMINE HCL 25 MG PO CAPS
50.0000 mg | ORAL_CAPSULE | Freq: Once | ORAL | Status: AC
  Administered 2022-09-03: 50 mg via ORAL
  Filled 2022-09-03: qty 2

## 2022-09-03 MED ORDER — SODIUM CHLORIDE 0.9 % IV SOLN: Freq: Once | INTRAVENOUS | Status: AC

## 2022-09-03 MED ORDER — MONTELUKAST SODIUM 10 MG PO TABS
10.0000 mg | ORAL_TABLET | Freq: Once | ORAL | Status: AC
  Administered 2022-09-03: 10 mg via ORAL
  Filled 2022-09-03: qty 1

## 2022-09-03 MED ORDER — SODIUM CHLORIDE 0.9% FLUSH
10.0000 mL | Freq: Once | INTRAVENOUS | Status: AC
  Administered 2022-09-03: 10 mL

## 2022-09-03 MED ORDER — ACETAMINOPHEN 325 MG PO TABS
650.0000 mg | ORAL_TABLET | Freq: Once | ORAL | Status: AC
  Administered 2022-09-03: 650 mg via ORAL
  Filled 2022-09-03: qty 2

## 2022-09-03 MED ORDER — SODIUM CHLORIDE 0.9 % IV SOLN
375.0000 mg/m2 | Freq: Once | INTRAVENOUS | Status: AC
  Administered 2022-09-03: 1000 mg via INTRAVENOUS
  Filled 2022-09-03: qty 100

## 2022-09-03 MED ORDER — FAMOTIDINE IN NACL 20-0.9 MG/50ML-% IV SOLN
20.0000 mg | Freq: Once | INTRAVENOUS | Status: AC
  Administered 2022-09-03: 20 mg via INTRAVENOUS
  Filled 2022-09-03: qty 50

## 2022-09-03 MED ORDER — HEPARIN SOD (PORK) LOCK FLUSH 100 UNIT/ML IV SOLN
500.0000 [IU] | Freq: Once | INTRAVENOUS | Status: AC | PRN
  Administered 2022-09-03: 500 [IU]

## 2022-09-03 MED ORDER — VINCRISTINE SULFATE CHEMO INJECTION 1 MG/ML
2.0000 mg | Freq: Once | INTRAVENOUS | Status: AC
  Administered 2022-09-03: 2 mg via INTRAVENOUS
  Filled 2022-09-03: qty 2

## 2022-09-03 MED ORDER — SODIUM CHLORIDE 0.9 % IV SOLN
750.0000 mg/m2 | Freq: Once | INTRAVENOUS | Status: AC
  Administered 2022-09-03: 2000 mg via INTRAVENOUS
  Filled 2022-09-03: qty 100

## 2022-09-03 MED ORDER — SODIUM CHLORIDE 0.9 % IV SOLN
10.0000 mg | Freq: Once | INTRAVENOUS | Status: AC
  Administered 2022-09-03: 10 mg via INTRAVENOUS
  Filled 2022-09-03: qty 10

## 2022-09-03 MED ORDER — SODIUM CHLORIDE 0.9% FLUSH
10.0000 mL | INTRAVENOUS | Status: DC | PRN
  Administered 2022-09-03: 10 mL

## 2022-09-03 NOTE — Patient Instructions (Signed)
[facility]  Discharge Instructions: Thank you for choosing [facility] to provide your oncology and hematology care.   If you have a lab appointment with the Cancer Center, please go directly to the Cancer Center and check in at the registration area.   Wear comfortable clothing and clothing appropriate for easy access to any Portacath or PICC line.   We strive to give you quality time with your provider. You may need to reschedule your appointment if you arrive late (15 or more minutes).  Arriving late affects you and other patients whose appointments are after yours.  Also, if you miss three or more appointments without notifying the office, you may be dismissed from the clinic at the provider's discretion.      For prescription refill requests, have your pharmacy contact our office and allow 72 hours for refills to be completed.    Today you received the following chemotherapy and/or immunotherapy agents: Doxorubicin, Vincristine, Cytoxan, Rituximab.       To help prevent nausea and vomiting after your treatment, we encourage you to take your nausea medication as directed.  BELOW ARE SYMPTOMS THAT SHOULD BE REPORTED IMMEDIATELY: *FEVER GREATER THAN 100.4 F (38 C) OR HIGHER *CHILLS OR SWEATING *NAUSEA AND VOMITING THAT IS NOT CONTROLLED WITH YOUR NAUSEA MEDICATION *UNUSUAL SHORTNESS OF BREATH *UNUSUAL BRUISING OR BLEEDING *URINARY PROBLEMS (pain or burning when urinating, or frequent urination) *BOWEL PROBLEMS (unusual diarrhea, constipation, pain near the anus) TENDERNESS IN MOUTH AND THROAT WITH OR WITHOUT PRESENCE OF ULCERS (sore throat, sores in mouth, or a toothache) UNUSUAL RASH, SWELLING OR PAIN  UNUSUAL VAGINAL DISCHARGE OR ITCHING   Items with * indicate a potential emergency and should be followed up as soon as possible or go to the Emergency Department if any problems should occur.  Please show the CHEMOTHERAPY ALERT CARD  or IMMUNOTHERAPY ALERT CARD at check-in to the Emergency Department and triage nurse.  Should you have questions after your visit or need to cancel or reschedule your appointment, please contact [facility]  Dept: [phone]  and follow the prompts.  Office hours are 8:00 a.m. to 4:30 p.m. Monday - Friday. Please note that voicemails left after 4:00 p.m. may not be returned until the following business day.  We are closed weekends and major holidays. You have access to a nurse at all times for urgent questions. Please call the main number to the clinic Dept: [phone] and follow the prompts.   For any non-urgent questions, you may also contact your provider using MyChart. We now offer e-Visits for anyone 72 and older to request care online for non-urgent symptoms. For details visit [email or web address].   Also download the MyChart app! Go to the app store, search "MyChart", open the app, select [place], and log in with your MyChart username and password.

## 2022-09-03 NOTE — Progress Notes (Signed)
[facility] [phone]   [phone]  PROGRESS NOTE  Patient Care Team: Juliette Alcide, MD as PCP - General Branch, Dorothe Pea, MD as PCP - Cardiology (Cardiology) Roma Kayser, MD as Consulting Physician (Endocrinology) Gwenith Daily, RN as [facility] Management  Hematological/Oncological History # Diffuse Large B Cell Lymphoma, Stage III 03/16/2022: establish care with Dr. Leonides Schanz [facility] 04/14/2022: excisional lymph node biopsy of right inguinal node shows DLBCL arising from follicular lymphoma 04/21/2022: PET CT scan showed widespread hypermetabolic lymphadenopathy in the neck, chest, abdomen, and pelvis 05/06/2021: Defer Cycle 1 Day 1 of R-CHOP chemotherapy. Patient has wound dehiscence and possible infection.  Seen by surgery same day. 05/14/2022: Cycle 1 Day 1 of R-CHOP chemotherapy 06/03/2022: Cycle 2 Day 1 of R-CHOP chemotherapy 06/25/2022: Cycle 3 Day 1 of R-CHOP chemotherapy 07/16/2022: Cycle 4, Day 1 of R-CHOP chemotherapy HELD due to healing right groin abscess 07/22/2022: PET CT Scan shows interval response to therapy with residual borderline to minimally hypermetabolic adenopathy in the abdomen and pelvis 07/23/2022: Cycle 4, Day 1 of R-CHOP 08/13/2022: Cycle 5, Day 1 of R-CHOP 09/03/2022: Cycle 6, Day 1 of R-CHOP  Interval History:  Alex Cortez 72 y.o. male with medical history significant for newly diagnosed diffuse large B-cell lymphoma who presents for a follow up visit. The patient's last visit was on 08/13/2022. In the interim since the last visit he completed Cycle 5.  On exam today Alex Cortez reports that he tolerated cycle 5 well with no major side effects.  He notes that he did have a little bit of nausea with his last cycle but responded well to his nausea pills and dissipated after 2 to 3 days.  He does not have any vomiting or diarrhea.  He has had about 5 pounds weight loss in the  interim.  He reports that he is not having any lightheadedness, dizziness, though he does have some shortness of breath on exertion.  Overall his energy levels are good and he is drinking plenty of fluid.  He notes that he did go to the beach for about 2 days since we last saw him.  He notes that the wound site on his leg is well-healed and is not draining, with any redness or pain.  Overall he feels well and is willing and able to proceed with treatment today.  He denies fevers, chills, sweats, chest pain or cough. He has no other complaints. Rest of the 10 point ROS is below.    MEDICAL HISTORY:  Past Medical History:  Diagnosis Date   CAD (coronary artery disease)    nonobstructive CAD/patent LAD stent site; EF 65%, 08/2011; NSTEMI/DES LAD, 6/20 11   Chest pain    Diabetes mellitus    insulin-dependent   Dyslipidemia    HTN (hypertension)    Hypothyroidism    Myocardial infarction (HCC) 2011   Post MI syndrome (HCC) 08/29/2009   Stroke (HCC) 10/22/2012   Left inferior pons    SURGICAL HISTORY: Past Surgical History:  Procedure Laterality Date   COLONOSCOPY  08/2021   CORONARY ANGIOPLASTY WITH STENT PLACEMENT  2011   IR IMAGING GUIDED PORT INSERTION  04/26/2022   LYMPH NODE BIOPSY Right 04/14/2022   Procedure: RIGHT INGUINAL EXCISIONAL LYMPH NODE BIOPSY;  Surgeon: Fritzi Mandes, MD;  Location: [facility];  Service: General;  Laterality: Right;    SOCIAL HISTORY: Social History   Socioeconomic History   Marital status: Married  Spouse name: Not on file   Number of children: Not on file   Years of education: Not on file   Highest education level: Not on file  Occupational History   Not on file  Tobacco Use   Smoking status: Former    Packs/day: 0.50    Years: 23.00    Additional pack years: 0.00    Total pack years: 11.50    Types: Cigarettes    Start date: 03/16/1955    Quit date: 03/15/1988    Years since quitting: 34.4   Smokeless tobacco: Never  Vaping Use   Vaping  Use: Never used  Substance and Sexual Activity   Alcohol use: No    Alcohol/week: 0.0 standard drinks of alcohol   Drug use: No   Sexual activity: Not on file  Other Topics Concern   Not on file  Social History Narrative   Works at [employer]. Married.    Social Determinants of Health   Financial Resource Strain: Low Risk  (06/24/2022)   Overall Financial Resource Strain (CARDIA)    Difficulty of Paying Living Expenses: Not very hard  Food Insecurity: No Food Insecurity (08/17/2022)   Hunger Vital Sign    Worried About Running Out of Food in the Last Year: Never true    Ran Out of Food in the Last Year: Never true  Transportation Needs: No Transportation Needs (08/17/2022)   PRAPARE - Administrator, Civil Service (Medical): No    Lack of Transportation (Non-Medical): No  Physical Activity: Inactive (05/24/2022)   Exercise Vital Sign    Days of Exercise per Week: 0 days    Minutes of Exercise per Session: 0 min  Stress: No Stress Concern Present (04/07/2022)   [employer] of Occupational Health - Occupational Stress Questionnaire    Feeling of Stress : Not at all  Social Connections: Unknown (07/31/2017)   Social Connection and Isolation Panel [NHANES]    Frequency of Communication with Friends and Family: Patient declined    Frequency of Social Gatherings with Friends and Family: Patient declined    Attends Religious Services: Patient declined    Database administrator or Organizations: Patient declined    Attends Banker Meetings: Patient declined    Marital Status: Patient declined  Intimate Partner Violence: Not At Risk (07/11/2022)   Humiliation, Afraid, Rape, and Kick questionnaire    Fear of Current or Ex-Partner: No    Emotionally Abused: No    Physically Abused: No    Sexually Abused: No    FAMILY HISTORY: Family History  Problem Relation Age of Onset   Colon cancer Mother    Hypertension Other        in all family members     ALLERGIES:  is allergic to ace inhibitors, rituxan [rituximab], and shrimp [shellfish allergy].  MEDICATIONS:  Current Outpatient Medications  Medication Sig Dispense Refill   allopurinol (ZYLOPRIM) 300 MG tablet TAKE 1 TABLET BY MOUTH EVERY DAY 90 tablet 1   amLODipine (NORVASC) 10 MG tablet Take 10 mg by mouth daily.     carvedilol (COREG) 25 MG tablet TAKE 1 TABLET BY MOUTH TWICE A DAY 60 tablet 3   clopidogrel (PLAVIX) 75 MG tablet Take 1 tablet (75 mg total) by mouth daily. 30 tablet 5   Continuous Blood Gluc Receiver (FREESTYLE LIBRE 2 READER) DEVI As directed 1 each 0   Continuous Blood Gluc Sensor (FREESTYLE LIBRE 2 SENSOR) MISC APPLY 1 SENSOR EVERY 14  DAYS 6 each 0   dapagliflozin propanediol (FARXIGA) 5 MG TABS tablet TAKE 1 TABLET BY MOUTH EVERY DAY BEFORE BREAKFAST (Patient taking differently: Take 5 mg by mouth daily before breakfast.) 90 tablet 0   ezetimibe (ZETIA) 10 MG tablet TAKE 1 TABLET BY MOUTH EVERY DAY 90 tablet 2   fluticasone (FLONASE) 50 MCG/ACT nasal spray Place 2 sprays into both nostrils daily as needed for allergies.     insulin regular human CONCENTRATED (HUMULIN R U-500 KWIKPEN) 500 UNIT/ML KwikPen INJECT AS DIRECTED 70 UNITS WITH BREAKFAST, 70 UNITS WITH LUNCH, AND 70 UNITS WITH SUPPER FOR PRE-MEAL BLOOD GLUCOSE READINGS OF 90MG /DL OR ABOVE 45 mL 0   isosorbide mononitrate (IMDUR) 30 MG 24 hr tablet TAKE 1 TABLET BY MOUTH EVERY DAY (Patient taking differently: Take 30 mg by mouth daily.) 90 tablet 1   levothyroxine (SYNTHROID) 75 MCG tablet TAKE 1 TABLET BY MOUTH EVERY DAY BEFORE BREAKFAST 90 tablet 0   lidocaine-prilocaine (EMLA) cream Apply 1 Application topically as needed. (Patient taking differently: Apply 1 Application topically daily as needed (port access).) 30 g 0   losartan (COZAAR) 50 MG tablet Take 1 tablet (50 mg total) by mouth daily.     metFORMIN (GLUCOPHAGE) 500 MG tablet TAKE 1 TABLET BY MOUTH EVERY DAY WITH BREAKFAST 90 tablet 0    nitroGLYCERIN (NITROSTAT) 0.4 MG SL tablet Place 1 tablet (0.4 mg total) under the tongue every 5 (five) minutes as needed. (Patient not taking: Reported on 07/11/2022) 25 tablet 3   ondansetron (ZOFRAN) 8 MG tablet Take 1 tablet (8 mg total) by mouth every 8 (eight) hours as needed. (Patient taking differently: Take 8 mg by mouth every 8 (eight) hours as needed for vomiting, nausea or refractory nausea / vomiting.) 30 tablet 0   ONE TOUCH ULTRA TEST test strip USE TO TEST BLOOD SUGAR FOUR TIMES DAILY E11.65 150 each 5   predniSONE (DELTASONE) 10 MG tablet Take 6 tablets (60 mg total) by mouth daily with breakfast. Take 6 tablets on Day 1 of chemotherapy and continue for 5 days. Repeat with each chemotherapy cycle. (Patient not taking: Reported on 08/10/2022) 30 tablet 5   predniSONE (DELTASONE) 20 MG tablet Take 60 mg by mouth See admin instructions. 60 mg once daily for 5 days, starting on day 1 of chemotherapy. Repeat with each chemotherapy cycle.     prochlorperazine (COMPAZINE) 10 MG tablet Take 1 tablet (10 mg total) by mouth every 6 (six) hours as needed for nausea or vomiting. 30 tablet 0   rosuvastatin (CRESTOR) 40 MG tablet TAKE 1 TABLET BY MOUTH EVERY DAY 90 tablet 2   No current facility-administered medications for this visit.   Facility-Administered Medications Ordered in Other Visits  Medication Dose Route Frequency Provider Last Rate Last Admin   cyclophosphamide (CYTOXAN) 2,000 mg in sodium chloride 0.9 % 500 mL chemo infusion  750 mg/m2 (Treatment Plan Recorded) Intravenous Once Jaci Standard, MD       DOXOrubicin (ADRIAMYCIN) chemo injection 132 mg  50 mg/m2 (Treatment Plan Recorded) Intravenous Once Jaci Standard, MD       heparin lock flush 100 unit/mL  500 Units Intracatheter Once PRN Jaci Standard, MD       riTUXimab-pvvr (RUXIENCE) 1,000 mg in sodium chloride 0.9 % 250 mL (2.8571 mg/mL) infusion  375 mg/m2 (Treatment Plan Recorded) Intravenous Once Ulysees Barns IV,  MD       sodium chloride flush (NS) 0.9 % injection 10  mL  10 mL Intracatheter PRN Jaci Standard, MD       vinCRIStine (ONCOVIN) 2 mg in sodium chloride 0.9 % 50 mL chemo infusion  2 mg Intravenous Once Jaci Standard, MD        REVIEW OF SYSTEMS:   Constitutional: ( - ) fevers, ( - )  chills , ( - ) night sweats Eyes: ( - ) blurriness of vision, ( - ) double vision, ( - ) watery eyes Ears, nose, mouth, throat, and face: ( - ) mucositis, ( - ) sore throat Respiratory: ( - ) cough, ( + ) dyspnea, ( - ) wheezes Cardiovascular: ( - ) palpitation, ( - ) chest discomfort, ( - ) lower extremity swelling Gastrointestinal:  ( - ) nausea, ( - ) heartburn, ( - ) change in bowel habits Skin: ( - ) abnormal skin rashes Lymphatics: ( - ) new lymphadenopathy, ( - ) easy bruising Neurological: ( + ) numbness, ( - ) tingling, ( - ) new weaknesses Behavioral/Psych: ( - ) mood change, ( - ) new changes  All other systems were reviewed with the patient and are negative.  PHYSICAL EXAMINATION: ECOG PERFORMANCE STATUS: 1 - Symptomatic but completely ambulatory  Vitals:   09/03/22 0930  BP: (!) 106/55  Pulse: 63  Resp: 19  Temp: 97.7 F (36.5 C)  SpO2: 99%    Filed Weights   09/03/22 0930  Weight: 271 lb 1 oz (123 kg)     Day 1, Cycle 4 07/23/22  Height 6\' 3"  (1.905 m)  Weight 276 lb 12.8 oz (125.6 kg)  BSA (Calculated - sq m) 2.58 sq meters  Temp 98.2 F (36.8 C)  Temp src Oral  Pulse 77  Resp 20  BP 109/57 !     GENERAL: Well-appearing elderly African-American male alert, no distress and comfortable SKIN: skin color, texture, turgor are normal, no rashes or significant lesions. EYES: conjunctiva are pink and non-injected, sclera clear LUNGS: clear to auscultation and percussion with normal breathing effort HEART: regular rate & rhythm and no murmurs and no lower extremity edema Musculoskeletal: no cyanosis of digits and no clubbing  PSYCH: alert & oriented x 3, fluent  speech NEURO: no focal motor/sensory deficits  LABORATORY DATA:  I have reviewed the data as listed    Latest Ref Rng & Units 09/03/2022    9:07 AM 08/13/2022    7:29 AM 07/23/2022    9:12 AM  CBC  WBC 4.0 - 10.5 K/uL 6.6  6.1  5.6   Hemoglobin 13.0 - 17.0 g/dL 9.8  11.9  14.7   Hematocrit 39.0 - 52.0 % 30.9  32.8  33.4   Platelets 150 - 400 K/uL 258  244  323        Latest Ref Rng & Units 09/03/2022    9:07 AM 08/13/2022    7:29 AM 07/23/2022    9:12 AM  CMP  Glucose 70 - 99 mg/dL 829  562  130   BUN 8 - 23 mg/dL 16  12  11    Creatinine 0.61 - 1.24 mg/dL 8.65  7.84  6.96   Sodium 135 - 145 mmol/L 140  143  141   Potassium 3.5 - 5.1 mmol/L 4.2  4.4  4.2   Chloride 98 - 111 mmol/L 106  108  109   CO2 22 - 32 mmol/L 27  27  27    Calcium 8.9 - 10.3 mg/dL 9.0  9.3  8.9  Total Protein 6.5 - 8.1 g/dL 6.3  6.7  6.7   Total Bilirubin 0.3 - 1.2 mg/dL 0.5  0.5  0.5   Alkaline Phos 38 - 126 U/L 56  61  58   AST 15 - 41 U/L 19  24  23    ALT 0 - 44 U/L 25  30  43     RADIOGRAPHIC STUDIES: I have personally reviewed the radiological images as listed and agreed with the findings in the report: Diffuse involving the lymph nodes in the neck, underarms, chest, abdomen and groin. No results found.  ASSESSMENT & PLAN Alex Cortez 72 y.o. male with medical history significant for newly diagnosed diffuse large B-cell lymphoma who presents for a follow up visit.  At this time findings are most consistent with diffuse large B-cell lymphoma stage III.  The difference tween stage III and stage IV is academic as there is no difference in treatment.  We will plan to proceed with 6 cycles of R-CHOP chemotherapy.  Chemotherapy will be administered every 3 weeks with G-CSF therapy to be provided on day 3.  Patient will be administered chemotherapy of full-strength.  His echocardiogram shows good baseline cardiac function.  Also his labs are strong at baseline.  The patient voices understanding of the  chemotherapy and the plan moving forward.  # Diffuse Large B Cell Lymphoma Stage III/IV -- At this time findings are most consistent with a diffuse large B-cell lymphoma arising from follicular lymphoma, stage III/IV.  Would favor stage 3 as there is no clear evidence of organ involvement, may have bone marrow involvement. -- Will proceed with R-CHOP chemotherapy.  Discussed the risks and benefits of this therapy as well as the anticipated side effects and scheduling. -- Cycle 1 Day 1 of R-CHOP chemotherapy on 05/14/2022 Plan: -- Due for Cycle 6, Day 1 today. --Labs today show white blood cell count 6.6, hemoglobin 9.8, MCV 88.3, and platelets of 258. Creatinine and LFTs normal.  --Proceed with treatment today since Dr. Freida Busman gave clearance to resume chemotherapy yesterday.  --Mid treatment PET scan performed on 07/22/2022 showed interval response to therapy with residual borderline to minimally hypermetabolic adenopathy in the abdomen and pelvis.  --RTC in 4 weeks with post treatment PET CT scan.   #Microcytic anemia: --Labs today show Hgb 9.8, improving --Iron panel checked on 05/12/2022 without deficiency.   # Wound Dehiscence/Wound Infection--recurrence --Completed antibiotic therapy with Bactrim. --Was evaluated by surgical team on 05/10/2022 with improvement of wound. They gave clearance to start chemotherapy scheduled for 05/14/2022.  --Readmitted 07/11/2022-07/14/2022 right groin cellulitis/abscess. He underwent I&D on 4/29 with culture that demonstrated growth of staph aureus that was pansensitivity. He was transition to oral antibiotics for 7 days with bactrim at discharge. --Had follow up with Dr. Freida Busman on 07/22/2022. Gave clearance to resume chemotherapy today. Patient was advised to gently pack the opening with a small piece of gauze for about 3 days and then he no longer needs to pack it with hopes that it will close up quickly.   #Supportive Care -- chemotherapy education complete -- port  placed -- zofran 8mg  q8H PRN and compazine 10mg  PO q6H for nausea -- allopurinol 300mg  PO daily for TLS prophylaxis -- EMLA cream for port -- no pain medication required at this time.   No orders of the defined types were placed in this encounter.  All questions were answered. The patient knows to call the clinic with any problems, questions or concerns.  A total  of more than 30 minutes were spent on this encounter with face-to-face time and non-face-to-face time, including preparing to see the patient, ordering tests and/or medications, counseling the patient and coordination of care as outlined above.   Ulysees Barns, MD Department of Hematology/Oncology [facility] Phone: [phone] Pager: [phone] Email: [email or web address]  09/03/2022 11:10 AM

## 2022-09-06 ENCOUNTER — Inpatient Hospital Stay: Payer: Medicare Other

## 2022-09-06 VITALS — BP 130/68 | HR 74 | Temp 98.7°F | Resp 18

## 2022-09-06 DIAGNOSIS — D509 Iron deficiency anemia, unspecified: Secondary | ICD-10-CM | POA: Diagnosis not present

## 2022-09-06 DIAGNOSIS — Z5112 Encounter for antineoplastic immunotherapy: Secondary | ICD-10-CM | POA: Diagnosis not present

## 2022-09-06 DIAGNOSIS — Z5111 Encounter for antineoplastic chemotherapy: Secondary | ICD-10-CM | POA: Diagnosis not present

## 2022-09-06 DIAGNOSIS — Z87891 Personal history of nicotine dependence: Secondary | ICD-10-CM | POA: Diagnosis not present

## 2022-09-06 DIAGNOSIS — C8338 Diffuse large B-cell lymphoma, lymph nodes of multiple sites: Secondary | ICD-10-CM | POA: Diagnosis not present

## 2022-09-06 DIAGNOSIS — Z5189 Encounter for other specified aftercare: Secondary | ICD-10-CM | POA: Diagnosis not present

## 2022-09-06 MED ORDER — PEGFILGRASTIM-PBBK 6 MG/0.6ML ~~LOC~~ SOSY
6.0000 mg | PREFILLED_SYRINGE | Freq: Once | SUBCUTANEOUS | Status: AC
  Administered 2022-09-06: 6 mg via SUBCUTANEOUS
  Filled 2022-09-06: qty 0.6

## 2022-09-07 ENCOUNTER — Other Ambulatory Visit: Payer: Self-pay

## 2022-09-07 MED ORDER — HUMULIN R U-500 KWIKPEN 500 UNIT/ML ~~LOC~~ SOPN: PEN_INJECTOR | SUBCUTANEOUS | 0 refills | Status: DC

## 2022-09-10 ENCOUNTER — Other Ambulatory Visit: Payer: Self-pay | Admitting: Cardiology

## 2022-09-23 ENCOUNTER — Other Ambulatory Visit: Payer: Self-pay | Admitting: "Endocrinology

## 2022-09-23 DIAGNOSIS — E1159 Type 2 diabetes mellitus with other circulatory complications: Secondary | ICD-10-CM

## 2022-10-06 ENCOUNTER — Ambulatory Visit: Payer: Self-pay | Admitting: *Deleted

## 2022-10-06 NOTE — Patient Outreach (Signed)
  Care Coordination   10/06/2022 Name: Alex Cortez MRN: [id number] DOB: Jul 27, 1950   Care Coordination Outreach Attempts:  An unsuccessful telephone outreach was attempted for a scheduled appointment today. I spoke with patient's wife, but patient was unavailable.   Follow Up Plan:  Additional outreach attempts will be made to offer the patient care coordination information and services.   Encounter Outcome:  Pt. Scheduled for a follow-up telephone call on 10/15/22 at 10:30. Wife is aware and will notify patient.   Care Coordination Interventions:  No, not indicated. Recent oncology notes reviewed as well as upcoming appointments.    Demetrios Loll, BSN, RN-BC RN Care Coordinator [facility]  [facility] Direct Dial: [phone] Main #: [phone]

## 2022-10-07 ENCOUNTER — Inpatient Hospital Stay: Payer: Medicare Other | Attending: Physician Assistant

## 2022-10-07 ENCOUNTER — Inpatient Hospital Stay: Payer: Medicare Other | Attending: Physician Assistant | Admitting: Hematology and Oncology

## 2022-10-07 ENCOUNTER — Other Ambulatory Visit: Payer: Self-pay

## 2022-10-07 ENCOUNTER — Other Ambulatory Visit: Payer: Self-pay | Admitting: Hematology and Oncology

## 2022-10-07 VITALS — BP 111/58 | HR 76 | Temp 97.5°F | Resp 15 | Wt 263.6 lb

## 2022-10-07 DIAGNOSIS — Z9221 Personal history of antineoplastic chemotherapy: Secondary | ICD-10-CM | POA: Diagnosis not present

## 2022-10-07 DIAGNOSIS — D509 Iron deficiency anemia, unspecified: Secondary | ICD-10-CM | POA: Diagnosis not present

## 2022-10-07 DIAGNOSIS — Z95828 Presence of other vascular implants and grafts: Secondary | ICD-10-CM

## 2022-10-07 DIAGNOSIS — C8338 Diffuse large B-cell lymphoma, lymph nodes of multiple sites: Secondary | ICD-10-CM | POA: Diagnosis not present

## 2022-10-07 DIAGNOSIS — Z87891 Personal history of nicotine dependence: Secondary | ICD-10-CM | POA: Insufficient documentation

## 2022-10-07 LAB — CMP (CANCER CENTER ONLY)
ALT: 58 U/L — ABNORMAL HIGH (ref 0–44)
AST: 44 U/L — ABNORMAL HIGH (ref 15–41)
Albumin: 3.8 g/dL (ref 3.5–5.0)
Alkaline Phosphatase: 60 U/L (ref 38–126)
Anion gap: 8 (ref 5–15)
BUN: 21 mg/dL (ref 8–23)
CO2: 25 mmol/L (ref 22–32)
Calcium: 9.1 mg/dL (ref 8.9–10.3)
Chloride: 108 mmol/L (ref 98–111)
Creatinine: 1.17 mg/dL (ref 0.61–1.24)
GFR, Estimated: >60 mL/min (ref >60)
Glucose, Bld: 83 mg/dL (ref 70–99)
Potassium: 4.2 mmol/L (ref 3.5–5.1)
Sodium: 141 mmol/L (ref 135–145)
Total Bilirubin: 0.4 mg/dL (ref 0.3–1.2)
Total Protein: 6.5 g/dL (ref 6.5–8.1)

## 2022-10-07 LAB — CBC WITH DIFFERENTIAL (CANCER CENTER ONLY)
Abs Immature Granulocytes: 0.17 10*3/uL — ABNORMAL HIGH (ref 0.00–0.07)
Basophils Absolute: 0.1 10*3/uL (ref 0.0–0.1)
Basophils Relative: 1 %
Eosinophils Absolute: 0.4 10*3/uL (ref 0.0–0.5)
Eosinophils Relative: 5 %
HCT: 32.4 % — ABNORMAL LOW (ref 39.0–52.0)
Hemoglobin: 10.2 g/dL — ABNORMAL LOW (ref 13.0–17.0)
Immature Granulocytes: 2 %
Lymphocytes Relative: 13 %
Lymphs Abs: 1.2 10*3/uL (ref 0.7–4.0)
MCH: 27.1 pg (ref 26.0–34.0)
MCHC: 31.5 g/dL (ref 30.0–36.0)
MCV: 86.2 fL (ref 80.0–100.0)
Monocytes Absolute: 0.7 10*3/uL (ref 0.1–1.0)
Monocytes Relative: 8 %
Neutro Abs: 6.5 10*3/uL (ref 1.7–7.7)
Neutrophils Relative %: 71 %
Platelet Count: 253 10*3/uL (ref 150–400)
RBC: 3.76 MIL/uL — ABNORMAL LOW (ref 4.22–5.81)
RDW: 17.2 % — ABNORMAL HIGH (ref 11.5–15.5)
WBC Count: 9.1 10*3/uL (ref 4.0–10.5)
nRBC: 0 % (ref 0.0–0.2)

## 2022-10-07 LAB — LACTATE DEHYDROGENASE: LDH: 324 U/L — ABNORMAL HIGH (ref 98–192)

## 2022-10-07 MED ORDER — HEPARIN SOD (PORK) LOCK FLUSH 100 UNIT/ML IV SOLN
500.0000 [IU] | Freq: Once | INTRAVENOUS | Status: AC
  Administered 2022-10-07: 500 [IU]

## 2022-10-07 MED ORDER — SODIUM CHLORIDE 0.9% FLUSH
10.0000 mL | Freq: Once | INTRAVENOUS | Status: AC
  Administered 2022-10-07: 10 mL

## 2022-10-07 NOTE — Progress Notes (Signed)
[facility] [phone]   [phone]  PROGRESS NOTE  Patient Care Team: Juliette Alcide, MD as PCP - General Branch, Dorothe Pea, MD as PCP - Cardiology (Cardiology) Roma Kayser, MD as Consulting Physician (Endocrinology) Gwenith Daily, RN as [facility] Management  Hematological/Oncological History # Diffuse Large B Cell Lymphoma, Stage III 03/16/2022: establish care with Dr. Leonides Schanz [facility] 04/14/2022: excisional lymph node biopsy of right inguinal node shows DLBCL arising from follicular lymphoma 04/21/2022: PET CT scan showed widespread hypermetabolic lymphadenopathy in the neck, chest, abdomen, and pelvis 05/06/2021: Defer Cycle 1 Day 1 of R-CHOP chemotherapy. Patient has wound dehiscence and possible infection.  Seen by surgery same day. 05/14/2022: Cycle 1 Day 1 of R-CHOP chemotherapy 06/03/2022: Cycle 2 Day 1 of R-CHOP chemotherapy 06/25/2022: Cycle 3 Day 1 of R-CHOP chemotherapy 07/16/2022: Cycle 4, Day 1 of R-CHOP chemotherapy HELD due to healing right groin abscess 07/22/2022: PET CT Scan shows interval response to therapy with residual borderline to minimally hypermetabolic adenopathy in the abdomen and pelvis 07/23/2022: Cycle 4, Day 1 of R-CHOP 08/13/2022: Cycle 5, Day 1 of R-CHOP 09/03/2022: Cycle 6, Day 1 of R-CHOP  Interval History:  Alex Cortez 72 y.o. male with medical history significant for newly diagnosed diffuse large B-cell lymphoma who presents for a follow up visit. The patient's last visit was on 09/03/2022. In the interim since the last visit he completed Cycle 6.  On exam today Alex Cortez reports he has been well since his last treatment of chemotherapy in June.  He notes that his taste is coming back though it is not yet completely improved.  He reports that he did unfortunately develop a blister at his prior surgical site and is now draining.  He is covering it with a paper towel.  He  notes that he is not using any formal dressing supplies.  He is not having any redness or pain, it is only draining.  He notes he does not have any bumps or lumps concerning for recurrence of disease.  He reports that his weight is currently 263 pounds, down 8 pounds from our last visit.  He thinks he is not eating well because of the change to his taste.  He denies fevers, chills, sweats, chest pain or cough. He has no other complaints. Rest of the 10 point ROS is below.    MEDICAL HISTORY:  Past Medical History:  Diagnosis Date   CAD (coronary artery disease)    nonobstructive CAD/patent LAD stent site; EF 65%, 08/2011; NSTEMI/DES LAD, 6/20 11   Chest pain    Diabetes mellitus    insulin-dependent   Dyslipidemia    HTN (hypertension)    Hypothyroidism    Myocardial infarction (HCC) 2011   Post MI syndrome (HCC) 08/29/2009   Stroke (HCC) 10/22/2012   Left inferior pons    SURGICAL HISTORY: Past Surgical History:  Procedure Laterality Date   COLONOSCOPY  08/2021   CORONARY ANGIOPLASTY WITH STENT PLACEMENT  2011   IR IMAGING GUIDED PORT INSERTION  04/26/2022   LYMPH NODE BIOPSY Right 04/14/2022   Procedure: RIGHT INGUINAL EXCISIONAL LYMPH NODE BIOPSY;  Surgeon: Fritzi Mandes, MD;  Location: [facility];  Service: General;  Laterality: Right;    SOCIAL HISTORY: Social History   Socioeconomic History   Marital status: Married    Spouse name: Not on file   Number of children: Not on file   Years of education: Not on  file   Highest education level: Not on file  Occupational History   Not on file  Tobacco Use   Smoking status: Former    Current packs/day: 0.00    Average packs/day: 0.5 packs/day for 33.0 years (16.5 ttl pk-yrs)    Types: Cigarettes    Start date: 03/16/1955    Quit date: 03/15/1988    Years since quitting: 34.5   Smokeless tobacco: Never  Vaping Use   Vaping status: Never Used  Substance and Sexual Activity   Alcohol use: No    Alcohol/week: 0.0 standard drinks  of alcohol   Drug use: No   Sexual activity: Not on file  Other Topics Concern   Not on file  Social History Narrative   Works at [employer]. Married.    Social Determinants of Health   Financial Resource Strain: Low Risk  (06/24/2022)   Overall Financial Resource Strain (CARDIA)    Difficulty of Paying Living Expenses: Not very hard  Food Insecurity: No Food Insecurity (08/17/2022)   Hunger Vital Sign    Worried About Running Out of Food in the Last Year: Never true    Ran Out of Food in the Last Year: Never true  Transportation Needs: No Transportation Needs (08/17/2022)   PRAPARE - Administrator, Civil Service (Medical): No    Lack of Transportation (Non-Medical): No  Physical Activity: Inactive (05/24/2022)   Exercise Vital Sign    Days of Exercise per Week: 0 days    Minutes of Exercise per Session: 0 min  Stress: No Stress Concern Present (04/07/2022)   [employer] of Occupational Health - Occupational Stress Questionnaire    Feeling of Stress : Not at all  Social Connections: Unknown (07/31/2017)   Social Connection and Isolation Panel [NHANES]    Frequency of Communication with Friends and Family: Patient declined    Frequency of Social Gatherings with Friends and Family: Patient declined    Attends Religious Services: Patient declined    Database administrator or Organizations: Patient declined    Attends Banker Meetings: Patient declined    Marital Status: Patient declined  Intimate Partner Violence: Not At Risk (07/11/2022)   Humiliation, Afraid, Rape, and Kick questionnaire    Fear of Current or Ex-Partner: No    Emotionally Abused: No    Physically Abused: No    Sexually Abused: No    FAMILY HISTORY: Family History  Problem Relation Age of Onset   Colon cancer Mother    Hypertension Other        in all family members    ALLERGIES:  is allergic to ace inhibitors, rituxan [rituximab], and shrimp [shellfish allergy].  MEDICATIONS:   Current Outpatient Medications  Medication Sig Dispense Refill   allopurinol (ZYLOPRIM) 300 MG tablet TAKE 1 TABLET BY MOUTH EVERY DAY 90 tablet 1   amLODipine (NORVASC) 10 MG tablet Take 10 mg by mouth daily.     carvedilol (COREG) 25 MG tablet TAKE 1 TABLET BY MOUTH TWICE A DAY 60 tablet 3   clopidogrel (PLAVIX) 75 MG tablet Take 1 tablet (75 mg total) by mouth daily. 30 tablet 5   Continuous Blood Gluc Receiver (FREESTYLE LIBRE 2 READER) DEVI As directed 1 each 0   Continuous Glucose Sensor (FREESTYLE LIBRE 2 SENSOR) MISC APPLY 1 SENSOR EVERY 14    DAYS 6 each 0   dapagliflozin propanediol (FARXIGA) 5 MG TABS tablet TAKE 1 TABLET BY MOUTH EVERY DAY BEFORE BREAKFAST (Patient taking  differently: Take 5 mg by mouth daily before breakfast.) 90 tablet 0   ezetimibe (ZETIA) 10 MG tablet TAKE 1 TABLET BY MOUTH EVERY DAY 90 tablet 2   fluticasone (FLONASE) 50 MCG/ACT nasal spray Place 2 sprays into both nostrils daily as needed for allergies.     insulin regular human CONCENTRATED (HUMULIN R U-500 KWIKPEN) 500 UNIT/ML KwikPen INJECT AS DIRECTED 70 UNITS WITH BREAKFAST, 70 UNITS WITH LUNCH, AND 70 UNITS WITH SUPPER FOR PRE-MEAL BLOOD GLUCOSE READINGS OF 90MG /DL OR ABOVE 45 mL 0   isosorbide mononitrate (IMDUR) 30 MG 24 hr tablet TAKE 1 TABLET BY MOUTH EVERY DAY 90 tablet 1   levothyroxine (SYNTHROID) 75 MCG tablet TAKE 1 TABLET BY MOUTH EVERY DAY BEFORE BREAKFAST 90 tablet 0   lidocaine-prilocaine (EMLA) cream Apply 1 Application topically as needed. (Patient taking differently: Apply 1 Application topically daily as needed (port access).) 30 g 0   losartan (COZAAR) 50 MG tablet Take 1 tablet (50 mg total) by mouth daily.     metFORMIN (GLUCOPHAGE) 500 MG tablet TAKE 1 TABLET BY MOUTH EVERY DAY WITH BREAKFAST 90 tablet 0   nitroGLYCERIN (NITROSTAT) 0.4 MG SL tablet Place 1 tablet (0.4 mg total) under the tongue every 5 (five) minutes as needed. (Patient not taking: Reported on 07/11/2022) 25 tablet 3    ondansetron (ZOFRAN) 8 MG tablet Take 1 tablet (8 mg total) by mouth every 8 (eight) hours as needed. (Patient taking differently: Take 8 mg by mouth every 8 (eight) hours as needed for vomiting, nausea or refractory nausea / vomiting.) 30 tablet 0   ONE TOUCH ULTRA TEST test strip USE TO TEST BLOOD SUGAR FOUR TIMES DAILY E11.65 150 each 5   predniSONE (DELTASONE) 10 MG tablet Take 6 tablets (60 mg total) by mouth daily with breakfast. Take 6 tablets on Day 1 of chemotherapy and continue for 5 days. Repeat with each chemotherapy cycle. (Patient not taking: Reported on 08/10/2022) 30 tablet 5   predniSONE (DELTASONE) 20 MG tablet Take 60 mg by mouth See admin instructions. 60 mg once daily for 5 days, starting on day 1 of chemotherapy. Repeat with each chemotherapy cycle.     prochlorperazine (COMPAZINE) 10 MG tablet Take 1 tablet (10 mg total) by mouth every 6 (six) hours as needed for nausea or vomiting. 30 tablet 0   rosuvastatin (CRESTOR) 40 MG tablet TAKE 1 TABLET BY MOUTH EVERY DAY 90 tablet 2   No current facility-administered medications for this visit.    REVIEW OF SYSTEMS:   Constitutional: ( - ) fevers, ( - )  chills , ( - ) night sweats Eyes: ( - ) blurriness of vision, ( - ) double vision, ( - ) watery eyes Ears, nose, mouth, throat, and face: ( - ) mucositis, ( - ) sore throat Respiratory: ( - ) cough, ( + ) dyspnea, ( - ) wheezes Cardiovascular: ( - ) palpitation, ( - ) chest discomfort, ( - ) lower extremity swelling Gastrointestinal:  ( - ) nausea, ( - ) heartburn, ( - ) change in bowel habits Skin: ( - ) abnormal skin rashes Lymphatics: ( - ) new lymphadenopathy, ( - ) easy bruising Neurological: ( + ) numbness, ( - ) tingling, ( - ) new weaknesses Behavioral/Psych: ( - ) mood change, ( - ) new changes  All other systems were reviewed with the patient and are negative.  PHYSICAL EXAMINATION: ECOG PERFORMANCE STATUS: 1 - Symptomatic but completely ambulatory  Vitals:  10/07/22 1521  BP: (!) 111/58  Pulse: 76  Resp: 15  Temp: (!) 97.5 F (36.4 C)  SpO2: 100%     Filed Weights   10/07/22 1521  Weight: 263 lb 9.6 oz (119.6 kg)      Day 1, Cycle 4 07/23/22  Height 6\' 3"  (1.905 m)  Weight 276 lb 12.8 oz (125.6 kg)  BSA (Calculated - sq m) 2.58 sq meters  Temp 98.2 F (36.8 C)  Temp src Oral  Pulse 77  Resp 20  BP 109/57 !     GENERAL: Well-appearing elderly African-American male alert, no distress and comfortable SKIN: skin color, texture, turgor are normal, no rashes or significant lesions. EYES: conjunctiva are pink and non-injected, sclera clear LUNGS: clear to auscultation and percussion with normal breathing effort HEART: regular rate & rhythm and no murmurs and no lower extremity edema Musculoskeletal: no cyanosis of digits and no clubbing  PSYCH: alert & oriented x 3, fluent speech NEURO: no focal motor/sensory deficits  LABORATORY DATA:  I have reviewed the data as listed    Latest Ref Rng & Units 10/07/2022    2:50 PM 09/03/2022    9:07 AM 08/13/2022    7:29 AM  CBC  WBC 4.0 - 10.5 K/uL 9.1  6.6  6.1   Hemoglobin 13.0 - 17.0 g/dL 16.1  9.8  09.6   Hematocrit 39.0 - 52.0 % 32.4  30.9  32.8   Platelets 150 - 400 K/uL 253  258  244        Latest Ref Rng & Units 10/07/2022    2:50 PM 09/03/2022    9:07 AM 08/13/2022    7:29 AM  CMP  Glucose 70 - 99 mg/dL 83  045  409   BUN 8 - 23 mg/dL 21  16  12    Creatinine 0.61 - 1.24 mg/dL 8.11  9.14  7.82   Sodium 135 - 145 mmol/L 141  140  143   Potassium 3.5 - 5.1 mmol/L 4.2  4.2  4.4   Chloride 98 - 111 mmol/L 108  106  108   CO2 22 - 32 mmol/L 25  27  27    Calcium 8.9 - 10.3 mg/dL 9.1  9.0  9.3   Total Protein 6.5 - 8.1 g/dL 6.5  6.3  6.7   Total Bilirubin 0.3 - 1.2 mg/dL 0.4  0.5  0.5   Alkaline Phos 38 - 126 U/L 60  56  61   AST 15 - 41 U/L 44  19  24   ALT 0 - 44 U/L 58  25  30     RADIOGRAPHIC STUDIES: I have personally reviewed the radiological images as listed  and agreed with the findings in the report: Diffuse involving the lymph nodes in the neck, underarms, chest, abdomen and groin. No results found.  ASSESSMENT & PLAN Alex Cortez 72 y.o. male with medical history significant for newly diagnosed diffuse large B-cell lymphoma who presents for a follow up visit.  At this time findings are most consistent with diffuse large B-cell lymphoma stage III.  The difference tween stage III and stage IV is academic as there is no difference in treatment.  We will plan to proceed with 6 cycles of R-CHOP chemotherapy.  Chemotherapy will be administered every 3 weeks with G-CSF therapy to be provided on day 3.  Patient will be administered chemotherapy of full-strength.  His echocardiogram shows good baseline cardiac function.  Also his labs are strong  at baseline.  The patient voices understanding of the chemotherapy and the plan moving forward.  # Diffuse Large B Cell Lymphoma Stage III/IV -- At this time findings are most consistent with a diffuse large B-cell lymphoma arising from follicular lymphoma, stage III/IV.  Would favor stage 3 as there is no clear evidence of organ involvement, may have bone marrow involvement. -- Will proceed with R-CHOP chemotherapy.  Discussed the risks and benefits of this therapy as well as the anticipated side effects and scheduling. -- Cycle 1 Day 1 of R-CHOP chemotherapy on 05/14/2022 Plan: -- patient completed 6 cycles of R-CHOP chemotherapy.  --Labs today show white blood cell count 9.1, Hgb 10.2, MCV 86.2, Plt 253. Creatinine and LFTs normal.  --Mid treatment PET scan performed on 07/22/2022 showed interval response to therapy with residual borderline to minimally hypermetabolic adenopathy in the abdomen and pelvis. Post treatment PET CT scan currently due.   --RTC in 3 months  #Microcytic anemia: --Labs today show Hgb 10.2, improving --Iron panel checked on 05/12/2022 without deficiency.   # Wound Dehiscence/Wound  Infection--recurrence --Completed antibiotic therapy with Bactrim. --Was evaluated by surgical team on 05/10/2022 with improvement of wound. They gave clearance to start chemotherapy scheduled for 05/14/2022.  --Readmitted 07/11/2022-07/14/2022 right groin cellulitis/abscess. He underwent I&D on 4/29 with culture that demonstrated growth of staph aureus that was pansensitivity. He was transition to oral antibiotics for 7 days with bactrim at discharge. --Had follow up with Dr. Freida Busman on 07/22/2022. Gave clearance to resume chemotherapy. Patient was advised to gently pack the opening with a small piece of gauze for about 3 days and then he no longer needs to pack it with hopes that it will close up quickly.  -- Wound was redressed by nurse Beth today.  #Supportive Care -- port can be removed if post treatment PET is clear.   Orders Placed This Encounter  Procedures   NM PET Image Restag (PS) Skull Base To Thigh    Standing Status:   Future    Standing Expiration Date:   10/09/2023    Order Specific Question:   If indicated for the ordered procedure, I authorize the administration of a radiopharmaceutical per Radiology protocol    Answer:   Yes    Order Specific Question:   Preferred imaging location?    Answer:   [facility]   All questions were answered. The patient knows to call the clinic with any problems, questions or concerns.  A total of more than 30 minutes were spent on this encounter with face-to-face time and non-face-to-face time, including preparing to see the patient, ordering tests and/or medications, counseling the patient and coordination of care as outlined above.   Ulysees Barns, MD Department of Hematology/Oncology [facility] Phone: [phone] Pager: [phone] Email: [email or web address]  10/09/2022 11:37 AM

## 2022-10-08 ENCOUNTER — Telehealth: Payer: Self-pay | Admitting: Hematology and Oncology

## 2022-10-09 ENCOUNTER — Encounter: Payer: Self-pay | Admitting: Hematology and Oncology

## 2022-10-10 ENCOUNTER — Other Ambulatory Visit: Payer: Self-pay

## 2022-10-12 ENCOUNTER — Other Ambulatory Visit: Payer: Self-pay | Admitting: "Endocrinology

## 2022-10-13 DIAGNOSIS — I5032 Chronic diastolic (congestive) heart failure: Secondary | ICD-10-CM | POA: Diagnosis not present

## 2022-10-13 DIAGNOSIS — E782 Mixed hyperlipidemia: Secondary | ICD-10-CM | POA: Diagnosis not present

## 2022-10-13 DIAGNOSIS — E1165 Type 2 diabetes mellitus with hyperglycemia: Secondary | ICD-10-CM | POA: Diagnosis not present

## 2022-10-13 DIAGNOSIS — I700 Atherosclerosis of aorta: Secondary | ICD-10-CM | POA: Diagnosis not present

## 2022-10-15 ENCOUNTER — Ambulatory Visit: Payer: Self-pay | Admitting: *Deleted

## 2022-10-15 ENCOUNTER — Encounter: Payer: Self-pay | Admitting: *Deleted

## 2022-10-15 NOTE — Patient Outreach (Signed)
Care Coordination   Follow Up Visit Note   10/15/2022 Name: Alex Cortez MRN: [id number] DOB: 1950/06/08  Alex Cortez is a 72 y.o. year old male who sees Burdine, Ananias Pilgrim, MD for primary care. I spoke with  Alex Cortez by phone today.  What matters to the patients health and wellness today?  Having PET scan and finding out if cancer treatments have worked    Goals Addressed               This Visit's Progress     Patient Stated     DM management (pt-stated)   On track     Care Coordination Goals: Patient will follow-up with endocrinologist every 3 months Patient will take medication as prescribed and reach out to provider with any negative side effects Patient will continue to monitor and record blood sugar 3 times per day and as needed with continuous glucose monitor, and will call endocrinologist with any readings outside of recommended range Patient will take blood sugar log and meter to provider visits for review Patient will follow a modified carbohydrate diet and decrease simple carbohydrates and sugars Patient will increase activity level as tolerated with an ultimate goal of at least 150 minutes of exercise per week Patient will reach out to RN Care Coordinator [phone] with any care coordination or resource needs        Other     Lymphoma Management   On track     Care Coordination Goals: Patient will keep all scheduled medical and imaging appointments Patient will eat a balanced diet to aid in general health, healing, and energy levels Patient will remain socially engaged with friends/family Patient will reach out to RN Care Coordinator with any care coordination or resource needs [phone]      Manage HTN   On track     Care Coordination Goals: Patient will take medications as directed and report any negative side effects to provider  Patient will monitor and record blood pressure 3 times a week and as needed and will call PCP or specialist  with any readings outside of recommended range Patient will keep all recommended follow-up appointments with PCP and specialists  Patient will take blood pressure log to PCP and specialty appointments for review Patient will follow a low sodium/DASH diet  Patient will reach out to RN Care Coordinator [phone] with any care coordination or resource needs          SDOH assessments and interventions completed:  Yes  SDOH Interventions Today    Flowsheet Row Most Recent Value  SDOH Interventions   Transportation Interventions Intervention Not Indicated  Financial Strain Interventions Intervention Not Indicated        Care Coordination Interventions:  Yes, provided  Interventions Today    Flowsheet Row Most Recent Value  Chronic Disease   Chronic disease during today's visit Diabetes, Hypertension (HTN), Other  [Lymphoma]  General Interventions   General Interventions Discussed/Reviewed General Interventions Discussed, General Interventions Reviewed, Labs, Durable Medical Equipment (DME), Doctor Visits  Labs Hgb A1c every 3 months  Doctor Visits Discussed/Reviewed Doctor Visits Discussed, Doctor Visits Reviewed, PCP, Specialist  Durable Medical Equipment (DME) BP Cuff, Glucomoter  [blood sugar was 145 this morning. No home blood pressure readings available. Last office visit reading was 111/58. Patient reports that blood pressure is not that low at home, but that it isn't high either]  PCP/Specialist Visits Compliance with follow-up visit  [10/25/22 PET scan at [facility], 11/29/22 cardiology,  12/13/22 endocrinology]  Exercise Interventions   Exercise Discussed/Reviewed Physical Activity  Physical Activity Discussed/Reviewed Physical Activity Discussed, Physical Activity Reviewed  [Performing ADLs independently. Active around home and runs errands. Rests as needed.]  Education Interventions   Education Provided Provided Education  Provided Verbal Education On Blood Sugar Monitoring,  Nutrition, When to see the doctor, Medication, Labs  Labs Reviewed Hgb A1c  [07/11/22 A1C 8.5% (has been elevated while going through chemo treatments)]  Nutrition Interventions   Nutrition Discussed/Reviewed Nutrition Discussed, Nutrition Reviewed, Carbohydrate meal planning, Portion sizes, Decreasing sugar intake, Increasing proteins, Adding fruits and vegetables  [3 meals a day with 30g of carbs and up to 2 snacks per day with <15 carbs. Appetite is improving since finishging chemo, but taste is still not back to normal yet]  Pharmacy Interventions   Pharmacy Dicussed/Reviewed Pharmacy Topics Discussed, Pharmacy Topics Reviewed, Medications and their functions  Safety Interventions   Safety Discussed/Reviewed Safety Discussed, Safety Reviewed       Follow up plan: Follow up call scheduled for 11/26/22    Encounter Outcome:  Pt. Visit Completed   Demetrios Loll, BSN, RN-BC RN Care Coordinator [facility]  [facility] Direct Dial: [phone] Main #: [phone]

## 2022-10-25 ENCOUNTER — Encounter (HOSPITAL_COMMUNITY)
Admission: RE | Admit: 2022-10-25 | Discharge: 2022-10-25 | Disposition: A | Discharge status: Home / Self Care | Payer: Medicare Other | Source: Ambulatory Visit | Attending: Hematology and Oncology | Admitting: Hematology and Oncology

## 2022-10-25 DIAGNOSIS — R918 Other nonspecific abnormal finding of lung field: Secondary | ICD-10-CM | POA: Diagnosis not present

## 2022-10-25 DIAGNOSIS — C8338 Diffuse large B-cell lymphoma, lymph nodes of multiple sites: Secondary | ICD-10-CM | POA: Insufficient documentation

## 2022-10-25 DIAGNOSIS — C8515 Unspecified B-cell lymphoma, lymph nodes of inguinal region and lower limb: Secondary | ICD-10-CM | POA: Diagnosis not present

## 2022-10-25 LAB — GLUCOSE, CAPILLARY: Glucose-Capillary: 90 mg/dL (ref 70–99)

## 2022-10-25 MED ORDER — FLUDEOXYGLUCOSE F - 18 (FDG) INJECTION
13.1200 | Freq: Once | INTRAVENOUS | Status: AC
  Administered 2022-10-25: 13.12 via INTRAVENOUS

## 2022-11-06 ENCOUNTER — Other Ambulatory Visit: Payer: Self-pay | Admitting: Cardiology

## 2022-11-16 ENCOUNTER — Emergency Department (HOSPITAL_COMMUNITY): Payer: Medicare Other

## 2022-11-16 ENCOUNTER — Other Ambulatory Visit: Payer: Self-pay

## 2022-11-16 ENCOUNTER — Inpatient Hospital Stay (HOSPITAL_COMMUNITY)
Admission: EM | Admit: 2022-11-16 | Discharge: 2022-11-18 | DRG: 871 | Disposition: A | Discharge status: Home Health | Payer: Medicare Other | Attending: Internal Medicine | Admitting: Internal Medicine

## 2022-11-16 ENCOUNTER — Encounter: Payer: Self-pay | Admitting: "Endocrinology

## 2022-11-16 DIAGNOSIS — E1140 Type 2 diabetes mellitus with diabetic neuropathy, unspecified: Secondary | ICD-10-CM | POA: Diagnosis present

## 2022-11-16 DIAGNOSIS — R0902 Hypoxemia: Secondary | ICD-10-CM | POA: Diagnosis not present

## 2022-11-16 DIAGNOSIS — J9811 Atelectasis: Secondary | ICD-10-CM | POA: Diagnosis not present

## 2022-11-16 DIAGNOSIS — E785 Hyperlipidemia, unspecified: Secondary | ICD-10-CM | POA: Diagnosis present

## 2022-11-16 DIAGNOSIS — E039 Hypothyroidism, unspecified: Secondary | ICD-10-CM | POA: Diagnosis not present

## 2022-11-16 DIAGNOSIS — I5032 Chronic diastolic (congestive) heart failure: Secondary | ICD-10-CM | POA: Insufficient documentation

## 2022-11-16 DIAGNOSIS — Z87891 Personal history of nicotine dependence: Secondary | ICD-10-CM | POA: Diagnosis not present

## 2022-11-16 DIAGNOSIS — Z452 Encounter for adjustment and management of vascular access device: Secondary | ICD-10-CM | POA: Diagnosis not present

## 2022-11-16 DIAGNOSIS — J984 Other disorders of lung: Secondary | ICD-10-CM | POA: Diagnosis not present

## 2022-11-16 DIAGNOSIS — I252 Old myocardial infarction: Secondary | ICD-10-CM

## 2022-11-16 DIAGNOSIS — E669 Obesity, unspecified: Secondary | ICD-10-CM | POA: Diagnosis not present

## 2022-11-16 DIAGNOSIS — Z7984 Long term (current) use of oral hypoglycemic drugs: Secondary | ICD-10-CM | POA: Diagnosis not present

## 2022-11-16 DIAGNOSIS — I959 Hypotension, unspecified: Secondary | ICD-10-CM | POA: Diagnosis not present

## 2022-11-16 DIAGNOSIS — Z79890 Hormone replacement therapy (postmenopausal): Secondary | ICD-10-CM

## 2022-11-16 DIAGNOSIS — N182 Chronic kidney disease, stage 2 (mild): Secondary | ICD-10-CM | POA: Diagnosis not present

## 2022-11-16 DIAGNOSIS — Z6834 Body mass index (BMI) 34.0-34.9, adult: Secondary | ICD-10-CM | POA: Diagnosis not present

## 2022-11-16 DIAGNOSIS — Z794 Long term (current) use of insulin: Secondary | ICD-10-CM

## 2022-11-16 DIAGNOSIS — J189 Pneumonia, unspecified organism: Secondary | ICD-10-CM | POA: Diagnosis present

## 2022-11-16 DIAGNOSIS — Z8572 Personal history of non-Hodgkin lymphomas: Secondary | ICD-10-CM | POA: Diagnosis not present

## 2022-11-16 DIAGNOSIS — R000 Tachycardia, unspecified: Secondary | ICD-10-CM | POA: Diagnosis not present

## 2022-11-16 DIAGNOSIS — I10 Essential (primary) hypertension: Secondary | ICD-10-CM | POA: Diagnosis not present

## 2022-11-16 DIAGNOSIS — Z1152 Encounter for screening for COVID-19: Secondary | ICD-10-CM

## 2022-11-16 DIAGNOSIS — I2510 Atherosclerotic heart disease of native coronary artery without angina pectoris: Secondary | ICD-10-CM | POA: Diagnosis not present

## 2022-11-16 DIAGNOSIS — Z955 Presence of coronary angioplasty implant and graft: Secondary | ICD-10-CM | POA: Diagnosis not present

## 2022-11-16 DIAGNOSIS — I517 Cardiomegaly: Secondary | ICD-10-CM | POA: Diagnosis not present

## 2022-11-16 DIAGNOSIS — Z8249 Family history of ischemic heart disease and other diseases of the circulatory system: Secondary | ICD-10-CM

## 2022-11-16 DIAGNOSIS — Z8673 Personal history of transient ischemic attack (TIA), and cerebral infarction without residual deficits: Secondary | ICD-10-CM

## 2022-11-16 DIAGNOSIS — Z79899 Other long term (current) drug therapy: Secondary | ICD-10-CM

## 2022-11-16 DIAGNOSIS — Z9221 Personal history of antineoplastic chemotherapy: Secondary | ICD-10-CM

## 2022-11-16 DIAGNOSIS — E1122 Type 2 diabetes mellitus with diabetic chronic kidney disease: Secondary | ICD-10-CM | POA: Diagnosis present

## 2022-11-16 DIAGNOSIS — Z91013 Allergy to seafood: Secondary | ICD-10-CM

## 2022-11-16 DIAGNOSIS — I130 Hypertensive heart and chronic kidney disease with heart failure and stage 1 through stage 4 chronic kidney disease, or unspecified chronic kidney disease: Secondary | ICD-10-CM | POA: Diagnosis present

## 2022-11-16 DIAGNOSIS — H16222 Keratoconjunctivitis sicca, not specified as Sjogren's, left eye: Secondary | ICD-10-CM | POA: Diagnosis not present

## 2022-11-16 DIAGNOSIS — H2513 Age-related nuclear cataract, bilateral: Secondary | ICD-10-CM | POA: Diagnosis not present

## 2022-11-16 DIAGNOSIS — E1165 Type 2 diabetes mellitus with hyperglycemia: Secondary | ICD-10-CM | POA: Diagnosis not present

## 2022-11-16 DIAGNOSIS — Z888 Allergy status to other drugs, medicaments and biological substances status: Secondary | ICD-10-CM

## 2022-11-16 DIAGNOSIS — Z7902 Long term (current) use of antithrombotics/antiplatelets: Secondary | ICD-10-CM

## 2022-11-16 DIAGNOSIS — A419 Sepsis, unspecified organism: Secondary | ICD-10-CM | POA: Diagnosis present

## 2022-11-16 DIAGNOSIS — R1111 Vomiting without nausea: Secondary | ICD-10-CM | POA: Diagnosis not present

## 2022-11-16 DIAGNOSIS — R4182 Altered mental status, unspecified: Secondary | ICD-10-CM | POA: Diagnosis not present

## 2022-11-16 DIAGNOSIS — J168 Pneumonia due to other specified infectious organisms: Secondary | ICD-10-CM | POA: Diagnosis not present

## 2022-11-16 DIAGNOSIS — E119 Type 2 diabetes mellitus without complications: Secondary | ICD-10-CM

## 2022-11-16 DIAGNOSIS — E113293 Type 2 diabetes mellitus with mild nonproliferative diabetic retinopathy without macular edema, bilateral: Secondary | ICD-10-CM | POA: Diagnosis not present

## 2022-11-16 DIAGNOSIS — R918 Other nonspecific abnormal finding of lung field: Secondary | ICD-10-CM | POA: Diagnosis not present

## 2022-11-16 LAB — RESP PANEL BY RT-PCR (RSV, FLU A&B, COVID)  RVPGX2
Influenza A by PCR: NEGATIVE
Influenza B by PCR: NEGATIVE
Resp Syncytial Virus by PCR: NEGATIVE
SARS Coronavirus 2 by RT PCR: NEGATIVE

## 2022-11-16 LAB — CBC WITH DIFFERENTIAL/PLATELET
Abs Immature Granulocytes: 1.5 10*3/uL — ABNORMAL HIGH (ref 0.00–0.07)
Band Neutrophils: 12 %
Basophils Absolute: 0 10*3/uL (ref 0.0–0.1)
Basophils Relative: 0 %
Eosinophils Absolute: 0 10*3/uL (ref 0.0–0.5)
Eosinophils Relative: 0 %
HCT: 39 % (ref 39.0–52.0)
Hemoglobin: 12.2 g/dL — ABNORMAL LOW (ref 13.0–17.0)
Lymphocytes Relative: 15 %
Lymphs Abs: 1.8 10*3/uL (ref 0.7–4.0)
MCH: 26.2 pg (ref 26.0–34.0)
MCHC: 31.3 g/dL (ref 30.0–36.0)
MCV: 83.9 fL (ref 80.0–100.0)
Metamyelocytes Relative: 7 %
Monocytes Absolute: 0.6 10*3/uL (ref 0.1–1.0)
Monocytes Relative: 5 %
Myelocytes: 5 %
Neutro Abs: 8.2 10*3/uL — ABNORMAL HIGH (ref 1.7–7.7)
Neutrophils Relative %: 56 %
Platelets: 195 10*3/uL (ref 150–400)
RBC: 4.65 MIL/uL (ref 4.22–5.81)
RDW: 15.2 % (ref 11.5–15.5)
WBC: 12.1 10*3/uL — ABNORMAL HIGH (ref 4.0–10.5)
nRBC: 0 % (ref 0.0–0.2)

## 2022-11-16 LAB — LACTIC ACID, PLASMA: Lactic Acid, Venous: 1.6 mmol/L (ref 0.5–1.9)

## 2022-11-16 LAB — BLOOD GAS, VENOUS
Acid-Base Excess: 4 mmol/L — ABNORMAL HIGH (ref 0.0–2.0)
Bicarbonate: 29.2 mmol/L — ABNORMAL HIGH (ref 20.0–28.0)
Drawn by: 442
O2 Saturation: 47.2 %
Patient temperature: 39
pCO2, Ven: 49 mmHg (ref 44–60)
pH, Ven: 7.39 (ref 7.25–7.43)
pO2, Ven: 33 mmHg (ref 32–45)

## 2022-11-16 LAB — I-STAT CHEM 8, ED
BUN: 17 mg/dL (ref 8–23)
Calcium, Ion: 1.2 mmol/L (ref 1.15–1.40)
Chloride: 107 mmol/L (ref 98–111)
Creatinine, Ser: 1.3 mg/dL — ABNORMAL HIGH (ref 0.61–1.24)
Glucose, Bld: 86 mg/dL (ref 70–99)
HCT: 39 % (ref 39.0–52.0)
Hemoglobin: 13.3 g/dL (ref 13.0–17.0)
Potassium: 3.8 mmol/L (ref 3.5–5.1)
Sodium: 143 mmol/L (ref 135–145)
TCO2: 24 mmol/L (ref 22–32)

## 2022-11-16 LAB — APTT: aPTT: 24 s (ref 24–36)

## 2022-11-16 LAB — COMPREHENSIVE METABOLIC PANEL
ALT: 24 U/L (ref 0–44)
AST: 31 U/L (ref 15–41)
Albumin: 4.1 g/dL (ref 3.5–5.0)
Alkaline Phosphatase: 55 U/L (ref 38–126)
Anion gap: 10 (ref 5–15)
BUN: 18 mg/dL (ref 8–23)
CO2: 23 mmol/L (ref 22–32)
Calcium: 9.2 mg/dL (ref 8.9–10.3)
Chloride: 107 mmol/L (ref 98–111)
Creatinine, Ser: 1.25 mg/dL — ABNORMAL HIGH (ref 0.61–1.24)
GFR, Estimated: >60 mL/min (ref >60)
Glucose, Bld: 91 mg/dL (ref 70–99)
Potassium: 3.8 mmol/L (ref 3.5–5.1)
Sodium: 140 mmol/L (ref 135–145)
Total Bilirubin: 0.8 mg/dL (ref 0.3–1.2)
Total Protein: 7.6 g/dL (ref 6.5–8.1)

## 2022-11-16 LAB — HM DIABETES EYE EXAM

## 2022-11-16 LAB — PROTIME-INR
INR: 1.1 (ref 0.8–1.2)
Prothrombin Time: 14.5 s (ref 11.4–15.2)

## 2022-11-16 MED ORDER — VANCOMYCIN HCL IN DEXTROSE 1-5 GM/200ML-% IV SOLN
1000.0000 mg | Freq: Once | INTRAVENOUS | Status: AC
  Administered 2022-11-16: 1000 mg via INTRAVENOUS
  Filled 2022-11-16: qty 200

## 2022-11-16 MED ORDER — SODIUM CHLORIDE 0.9 % IV SOLN
2.0000 g | Freq: Three times a day (TID) | INTRAVENOUS | Status: DC
  Administered 2022-11-17 – 2022-11-18 (×4): 2 g via INTRAVENOUS
  Filled 2022-11-16 (×4): qty 12.5

## 2022-11-16 MED ORDER — ACETAMINOPHEN 325 MG PO TABS
650.0000 mg | ORAL_TABLET | Freq: Once | ORAL | Status: AC
  Administered 2022-11-16: 650 mg via ORAL
  Filled 2022-11-16: qty 2

## 2022-11-16 MED ORDER — LACTATED RINGERS IV BOLUS
1000.0000 mL | Freq: Once | INTRAVENOUS | Status: AC
  Administered 2022-11-16: 1000 mL via INTRAVENOUS

## 2022-11-16 MED ORDER — VANCOMYCIN HCL IN DEXTROSE 1-5 GM/200ML-% IV SOLN
1000.0000 mg | Freq: Once | INTRAVENOUS | Status: AC
  Administered 2022-11-16: 1000 mg via INTRAVENOUS

## 2022-11-16 MED ORDER — LACTATED RINGERS IV SOLN: INTRAVENOUS | Status: DC

## 2022-11-16 MED ORDER — LACTATED RINGERS IV BOLUS (SEPSIS)
1000.0000 mL | Freq: Once | INTRAVENOUS | Status: AC
  Administered 2022-11-16: 1000 mL via INTRAVENOUS

## 2022-11-16 MED ORDER — KETOROLAC TROMETHAMINE 30 MG/ML IJ SOLN
10.0000 mg | Freq: Once | INTRAMUSCULAR | Status: AC
  Administered 2022-11-16: 9.9 mg via INTRAVENOUS
  Filled 2022-11-16: qty 1

## 2022-11-16 MED ORDER — SODIUM CHLORIDE 0.9 % IV SOLN
2.0000 g | Freq: Once | INTRAVENOUS | Status: AC
  Administered 2022-11-16: 2 g via INTRAVENOUS
  Filled 2022-11-16: qty 12.5

## 2022-11-16 MED ORDER — VANCOMYCIN HCL IN DEXTROSE 1-5 GM/200ML-% IV SOLN
1000.0000 mg | Freq: Once | INTRAVENOUS | Status: DC
  Filled 2022-11-16: qty 200

## 2022-11-16 MED ORDER — VANCOMYCIN HCL 1750 MG/350ML IV SOLN
1750.0000 mg | INTRAVENOUS | Status: DC
  Administered 2022-11-17: 1750 mg via INTRAVENOUS
  Filled 2022-11-16 (×3): qty 350

## 2022-11-16 NOTE — ED Notes (Signed)
Asked patient for urine sample; patient states he cannot give sample right now

## 2022-11-16 NOTE — ED Triage Notes (Signed)
Pt bib [facility] from home. Family reports pt has been not been eating like normal and has been c/o of "feeling unwell" x2 days. Per EMS pt was intermittently confused. Hx of CA, last chemo treatment approx 1 month ago. Temp of 102.2 in triage

## 2022-11-16 NOTE — ED Notes (Signed)
Assisted patient to sit on side of bed to obtain urine sample. Patient able to sit up freely, but reports he cannot produce a sample at this time. Bolus fluids administered per order

## 2022-11-16 NOTE — ED Notes (Signed)
Portable xray at bedside.

## 2022-11-16 NOTE — ED Notes (Signed)
ED Provider at bedside. 

## 2022-11-16 NOTE — Sepsis Progress Note (Signed)
Elink monitoring for the code sepsis protocol.  

## 2022-11-16 NOTE — ED Notes (Signed)
ED TO INPATIENT HANDOFF REPORT  ED Nurse Name and Phone #: Jacques Earthly Name/Age/Gender Alex Cortez 72 y.o. male Room/Bed: APA09/APA09  Code Status   Code Status: Prior  Home/SNF/Other Home Patient oriented to: self, place, time, and situation Is this baseline? Yes   Triage Complete: Triage complete  Chief Complaint Sepsis due to pneumonia (HCC) [J18.9, A41.9]  Triage Note Pt bib [facility] from home. Family reports pt has been not been eating like normal and has been c/o of "feeling unwell" x2 days. Per EMS pt was intermittently confused. Hx of CA, last chemo treatment approx 1 month ago. Temp of 102.2 in triage   Allergies Allergies  Allergen Reactions   Ace Inhibitors Swelling    Angioedema   Other Swelling    Shellfish   Swelling and hives  Shellfish  Swelling and hives   Rituxan [Rituximab] Other (See Comments)    Pt had sensitivity reaction to Rituxan. See progress note from 05/14/22. Pt able to complete infusion.    Shrimp [Shellfish Allergy] Hives and Swelling    Level of Care/Admitting Diagnosis ED Disposition     ED Disposition  Admit   Condition  --   Comment  Hospital Area: [facility] [[id number]]  Level of Care: Telemetry [5]  Covid Evaluation: Asymptomatic - no recent exposure (last 10 days) testing not required  Diagnosis: Sepsis due to pneumonia [facility]) [[id number]]  Admitting Physician: Hannah Beat [[id number]]  Attending Physician: Hannah Beat [[id number]]  Certification:: I certify this patient will need inpatient services for at least 2 midnights  Expected Medical Readiness: 11/19/2022          B Medical/Surgery History Past Medical History:  Diagnosis Date   CAD (coronary artery disease)    nonobstructive CAD/patent LAD stent site; EF 65%, 08/2011; NSTEMI/DES LAD, 6/20 11   Chest pain    Diabetes mellitus    insulin-dependent   Dyslipidemia    HTN (hypertension)    Hypothyroidism    Myocardial infarction (HCC) 2011   Post MI  syndrome (HCC) 08/29/2009   Stroke (HCC) 10/22/2012   Left inferior pons   Past Surgical History:  Procedure Laterality Date   COLONOSCOPY  08/2021   CORONARY ANGIOPLASTY WITH STENT PLACEMENT  2011   IR IMAGING GUIDED PORT INSERTION  04/26/2022   LYMPH NODE BIOPSY Right 04/14/2022   Procedure: RIGHT INGUINAL EXCISIONAL LYMPH NODE BIOPSY;  Surgeon: Fritzi Mandes, MD;  Location: [facility];  Service: General;  Laterality: Right;     A IV Location/Drains/Wounds Patient Lines/Drains/Airways Status     Active Line/Drains/Airways     Name Placement date Placement time Site Days   Implanted Port 04/26/22 Right Chest 04/26/22  1155  Chest  204   Peripheral IV 11/16/22 20 G Anterior;Left;Proximal Forearm 11/16/22  2004  Forearm  less than 1   Peripheral IV 11/16/22 18 G 1.88" Anterior;Proximal;Right Forearm 11/16/22  2036  Forearm  less than 1   Wound / Incision (Open or Dehisced) Incision - Dehisced Thigh Anterior;Right R GROIN INCISION< DEHISCENCE, purulent drainage --  --  Thigh  --            Intake/Output Last 24 hours No intake or output data in the 24 hours ending 11/16/22 2349  Labs/Imaging Results for orders placed or performed during the hospital encounter of 11/16/22 (from the past 48 hour(s))  Blood Culture (routine x 2)     Status: None (Preliminary result)   Collection Time: 11/16/22  8:26 PM  Specimen: BLOOD  Result Value Ref Range   Specimen Description BLOOD BLOOD LEFT HAND    Special Requests      BOTTLES DRAWN AEROBIC AND ANAEROBIC Blood Culture adequate volume Performed at [facility], [address] [place], [place] [address]    Culture PENDING    Report Status PENDING   Resp panel by RT-PCR (RSV, Flu A&B, Covid) Anterior Nasal Swab     Status: None   Collection Time: 11/16/22  8:28 PM   Specimen: Anterior Nasal Swab  Result Value Ref Range   SARS Coronavirus 2 by RT PCR NEGATIVE NEGATIVE    Comment: (NOTE) SARS-CoV-2 target nucleic acids are NOT  DETECTED.  The SARS-CoV-2 RNA is generally detectable in upper respiratory specimens during the acute phase of infection. The lowest concentration of SARS-CoV-2 viral copies this assay can detect is 138 copies/mL. A negative result does not preclude SARS-Cov-2 infection and should not be used as the sole basis for treatment or other patient management decisions. A negative result may occur with  improper specimen collection/handling, submission of specimen other than nasopharyngeal swab, presence of viral mutation(s) within the areas targeted by this assay, and inadequate number of viral copies(<138 copies/mL). A negative result must be combined with clinical observations, patient history, and epidemiological information. The expected result is Negative.  Fact Sheet for Patients:  BloggerCourse.com  Fact Sheet for Healthcare Providers:  SeriousBroker.it  This test is no t yet approved or cleared by the [place] FDA and  has been authorized for detection and/or diagnosis of SARS-CoV-2 by FDA under an Emergency Use Authorization (EUA). This EUA will remain  in effect (meaning this test can be used) for the duration of the COVID-19 declaration under Section 564(b)(1) of the Act, 21 U.S.C.section 360bbb-3(b)(1), unless the authorization is terminated  or revoked sooner.       Influenza A by PCR NEGATIVE NEGATIVE   Influenza B by PCR NEGATIVE NEGATIVE    Comment: (NOTE) The Xpert Xpress SARS-CoV-2/FLU/RSV plus assay is intended as an aid in the diagnosis of influenza from Nasopharyngeal swab specimens and should not be used as a sole basis for treatment. Nasal washings and aspirates are unacceptable for Xpert Xpress SARS-CoV-2/FLU/RSV testing.  Fact Sheet for Patients: BloggerCourse.com  Fact Sheet for Healthcare Providers: SeriousBroker.it  This test is not yet approved or  cleared by the [place] FDA and has been authorized for detection and/or diagnosis of SARS-CoV-2 by FDA under an Emergency Use Authorization (EUA). This EUA will remain in effect (meaning this test can be used) for the duration of the COVID-19 declaration under Section 564(b)(1) of the Act, 21 U.S.C. section 360bbb-3(b)(1), unless the authorization is terminated or revoked.     Resp Syncytial Virus by PCR NEGATIVE NEGATIVE    Comment: (NOTE) Fact Sheet for Patients: BloggerCourse.com  Fact Sheet for Healthcare Providers: SeriousBroker.it  This test is not yet approved or cleared by the [place] FDA and has been authorized for detection and/or diagnosis of SARS-CoV-2 by FDA under an Emergency Use Authorization (EUA). This EUA will remain in effect (meaning this test can be used) for the duration of the COVID-19 declaration under Section 564(b)(1) of the Act, 21 U.S.C. section 360bbb-3(b)(1), unless the authorization is terminated or revoked.  Performed at [facility], [address], [place], [place] [address]   Lactic acid, plasma     Status: None   Collection Time: 11/16/22  8:35 PM  Result Value Ref Range   Lactic Acid, Venous 1.6  0.5 - 1.9 mmol/L    Comment: Performed at [facility], [address] [place], [place] [address]  Comprehensive metabolic panel     Status: Abnormal   Collection Time: 11/16/22  8:35 PM  Result Value Ref Range   Sodium 140 135 - 145 mmol/L   Potassium 3.8 3.5 - 5.1 mmol/L   Chloride 107 98 - 111 mmol/L   CO2 23 22 - 32 mmol/L   Glucose, Bld 91 70 - 99 mg/dL    Comment: Glucose reference range applies only to samples taken after fasting for at least 8 hours.   BUN 18 8 - 23 mg/dL   Creatinine, Ser 6.30 (H) 0.61 - 1.24 mg/dL   Calcium 9.2 8.9 - 16.0 mg/dL   Total Protein 7.6 6.5 - 8.1 g/dL   Albumin 4.1 3.5 - 5.0 g/dL   AST 31 15 - 41 U/L   ALT 24 0 - 44 U/L   Alkaline Phosphatase 55  38 - 126 U/L   Total Bilirubin 0.8 0.3 - 1.2 mg/dL   GFR, Estimated >10 >93 mL/min    Comment: (NOTE) Calculated using the CKD-EPI Creatinine Equation (2021)    Anion gap 10 5 - 15    Comment: Performed at [facility], [address] [place], [place] [address]  CBC with Differential     Status: Abnormal   Collection Time: 11/16/22  8:35 PM  Result Value Ref Range   WBC 12.1 (H) 4.0 - 10.5 K/uL   RBC 4.65 4.22 - 5.81 MIL/uL   Hemoglobin 12.2 (L) 13.0 - 17.0 g/dL   HCT 32.2 02.5 - 42.7 %   MCV 83.9 80.0 - 100.0 fL   MCH 26.2 26.0 - 34.0 pg   MCHC 31.3 30.0 - 36.0 g/dL   RDW 06.2 37.6 - 28.3 %   Platelets 195 150 - 400 K/uL   nRBC 0.0 0.0 - 0.2 %   Neutrophils Relative % 56 %   Neutro Abs 8.2 (H) 1.7 - 7.7 K/uL   Band Neutrophils 12 %   Lymphocytes Relative 15 %   Lymphs Abs 1.8 0.7 - 4.0 K/uL   Monocytes Relative 5 %   Monocytes Absolute 0.6 0.1 - 1.0 K/uL   Eosinophils Relative 0 %   Eosinophils Absolute 0.0 0.0 - 0.5 K/uL   Basophils Relative 0 %   Basophils Absolute 0.0 0.0 - 0.1 K/uL   WBC Morphology DOHLE BODIES     Comment: Moderate Left Shift (>5% metas and myelos) TOXIC GRANULATION REACTIVE LYMPHOCYTES PRESENT    RBC Morphology POIKILOCYTOSIS PRESENT    Smear Review MORPHOLOGY UNREMARKABLE    Metamyelocytes Relative 7 %   Myelocytes 5 %   Abs Immature Granulocytes 1.50 (H) 0.00 - 0.07 K/uL    Comment: Performed at [facility], [address], [place], [place] [address]  Protime-INR     Status: None   Collection Time: 11/16/22  8:35 PM  Result Value Ref Range   Prothrombin Time 14.5 11.4 - 15.2 seconds   INR 1.1 0.8 - 1.2    Comment: (NOTE) INR goal varies based on device and disease states. Performed at [facility], [address] [place], [place] [address]   APTT     Status: None   Collection Time: 11/16/22  8:35 PM  Result Value Ref Range   aPTT 24 24 - 36 seconds    Comment: Performed at [facility], [address], [place], [place] [address]  Blood  Culture (routine x 2)  Status: None (Preliminary result)   Collection Time: 11/16/22  8:35 PM   Specimen: BLOOD  Result Value Ref Range   Specimen Description BLOOD BLOOD LEFT ARM    Special Requests      BOTTLES DRAWN AEROBIC AND ANAEROBIC Blood Culture adequate volume Performed at [facility], [address] [place], [place] [address]    Culture PENDING    Report Status PENDING   Blood gas, venous (at [facility] and [facility])     Status: Abnormal   Collection Time: 11/16/22  8:36 PM  Result Value Ref Range   pH, Ven 7.39 7.25 - 7.43   pCO2, Ven 49 44 - 60 mmHg   pO2, Ven 33 32 - 45 mmHg   Bicarbonate 29.2 (H) 20.0 - 28.0 mmol/L   Acid-Base Excess 4.0 (H) 0.0 - 2.0 mmol/L   O2 Saturation 47.2 %   Patient temperature 39.0    Collection site RIGHT ANTECUBITAL    Drawn by 442     Comment: Performed at [facility], [address], [place], [place] [address]  I-stat chem 8, ED     Status: Abnormal   Collection Time: 11/16/22  8:47 PM  Result Value Ref Range   Sodium 143 135 - 145 mmol/L   Potassium 3.8 3.5 - 5.1 mmol/L   Chloride 107 98 - 111 mmol/L   BUN 17 8 - 23 mg/dL   Creatinine, Ser 7.37 (H) 0.61 - 1.24 mg/dL   Glucose, Bld 86 70 - 99 mg/dL    Comment: Glucose reference range applies only to samples taken after fasting for at least 8 hours.   Calcium, Ion 1.20 1.15 - 1.40 mmol/L   TCO2 24 22 - 32 mmol/L   Hemoglobin 13.3 13.0 - 17.0 g/dL   HCT 10.6 26.9 - 48.5 %   CT Chest Wo Contrast  Result Date: 11/16/2022 CLINICAL DATA:  Pneumonia, complication suspected, xray done. Sepsis. Shortness of breath, fever. EXAM: CT CHEST WITHOUT CONTRAST TECHNIQUE: Multidetector CT imaging of the chest was performed following the standard protocol without IV contrast. RADIATION DOSE REDUCTION: This exam was performed according to the departmental dose-optimization program which includes automated exposure control, adjustment of the mA and/or kV according to patient size and/or use of iterative  reconstruction technique. COMPARISON:  Chest x-ray today.  PET CT 10/25/2022. FINDINGS: Cardiovascular: Heart is mildly enlarged. Diffuse coronary artery and moderate aortic calcifications. No aneurysm. Mediastinum/Nodes: No mediastinal, hilar, or axillary adenopathy. Trachea and esophagus are unremarkable. Thyroid unremarkable. Lungs/Pleura: Nodular airspace disease in the right middle lobe and right lower lobe and to a lesser extent left lower lobe. Findings most compatible with pneumonia. Dependent and bibasilar atelectasis. No effusions. Upper Abdomen: No acute findings Musculoskeletal: Chest wall soft tissues are unremarkable. No acute bony abnormality. IMPRESSION: Nodular airspace disease in the right middle lobe, right lower lobe and to a lesser extent left lower lobe, likely pneumonia. Followup chest CT is recommended in 3-4 weeks following trial of antibiotic therapy to ensure resolution and exclude underlying malignancy. Diffuse coronary artery disease. Dependent and bibasilar atelectasis. Aortic Atherosclerosis (ICD10-I70.0). Electronically Signed   By: Charlett Nose M.D.   On: 11/16/2022 23:22   DG Chest Port 1 View  Result Date: 11/16/2022 CLINICAL DATA:  Question of sepsis. Unwell feeling for 2 days. History of cancer with last chemotherapy treatment 1 month ago. EXAM: PORTABLE CHEST 1 VIEW COMPARISON:  07/11/2022 FINDINGS: Shallow inspiration. Cardiac enlargement. Lungs are clear. No pleural effusions. No pneumothorax. Mediastinal contours appear intact. Power  port type central venous catheter with tip over the cavoatrial junction region. IMPRESSION: Shallow inspiration. Cardiac enlargement. No evidence of active pulmonary disease. Electronically Signed   By: Burman Nieves M.D.   On: 11/16/2022 22:09    Pending Labs Unresulted Labs (From admission, onward)     Start     Ordered   11/16/22 2021  Urinalysis, w/ Reflex to Culture (Infection Suspected) -Urine, Clean Catch  (Septic presentation  on arrival (screening labs, nursing and treatment orders for obvious sepsis))  ONCE - URGENT,   URGENT       Question:  Specimen Source  Answer:  Urine, Clean Catch   11/16/22 2021            Vitals/Pain Today's Vitals   11/16/22 2145 11/16/22 2230 11/16/22 2300 11/16/22 2330  BP: 130/65 123/62 (!) 114/91 (!) 119/59  Pulse: 98 89 82 80  Resp: (!) 29 (!) 24 19 19   Temp:  (!) 100.9 F (38.3 C) 98.5 F (36.9 C)   TempSrc:  Oral Oral   SpO2: 98% 97% 98% 98%  Weight:      Height:      PainSc:        Isolation Precautions No active isolations  Medications Medications  lactated ringers infusion ( Intravenous New Bag/Given 11/16/22 2124)  ceFEPIme (MAXIPIME) 2 g in sodium chloride 0.9 % 100 mL IVPB (has no administration in time range)  vancomycin (VANCOREADY) IVPB 1750 mg/350 mL (has no administration in time range)  ceFEPIme (MAXIPIME) 2 g in sodium chloride 0.9 % 100 mL IVPB (0 g Intravenous Stopped 11/16/22 2121)  lactated ringers bolus 1,000 mL (0 mLs Intravenous Stopped 11/16/22 2140)  acetaminophen (TYLENOL) tablet 650 mg (650 mg Oral Given 11/16/22 2041)  vancomycin (VANCOCIN) IVPB 1000 mg/200 mL premix (0 mg Intravenous Stopped 11/16/22 2225)  ketorolac (TORADOL) 30 MG/ML injection 9.9 mg (9.9 mg Intravenous Given 11/16/22 2155)  vancomycin (VANCOCIN) IVPB 1000 mg/200 mL premix (0 mg Intravenous Stopped 11/16/22 2346)  lactated ringers bolus 1,000 mL (1,000 mLs Intravenous New Bag/Given 11/16/22 2314)    Mobility      Focused Assessments    R Recommendations: See Admitting Provider Note  Report given to:   Additional Notes: A&O; 2L via [place](none at baseline); 20G LAC; 18G RFA

## 2022-11-16 NOTE — Progress Notes (Signed)
Pharmacy Antibiotic Note  Alex Cortez is a 72 y.o. male with large B cell lymphoma admitted on 11/16/2022 with sepsis.  Pharmacy has been consulted for vancomycin and cefepime dosing.  Plan: Cefepime 2g IV q8h x 7 days Vancomycin 2g IV total load, then 1750mg  IV q24h x 6 days for estimated AUC 508 using SCr 1.3, Vd 0.5 Check vancomycin levels at steady state, goal AUC 400-550 Follow up renal function, cultures as available, clinical progress   Height: 6\' 3"  (190.5 cm) Weight: 126.2 kg (278 lb 3.2 oz) IBW/kg (Calculated) : 84.5  Temp (24hrs), Avg:102.2 F (39 C), Min:102.2 F (39 C), Max:102.2 F (39 C)  Recent Labs  Lab 11/16/22 2035 11/16/22 2047  WBC 12.1*  --   CREATININE  --  1.30*    Estimated Creatinine Clearance: 72.4 mL/min (A) (by C-G formula based on SCr of 1.3 mg/dL (H)).    Allergies  Allergen Reactions   Ace Inhibitors Swelling    Angioedema   Rituxan [Rituximab] Other (See Comments)    Pt had sensitivity reaction to Rituxan. See progress note from 05/14/22. Pt able to complete infusion.    Shrimp [Shellfish Allergy] Hives and Swelling    Antimicrobials this admission: 9/3 Vanc >> 9/3 Cefepime >>  Dose adjustments this admission:  Microbiology results: 9/3 BCx: 9/3 Resp panel:  Thank you for allowing pharmacy to be a part of this patient's care.  Loralee Pacas, PharmD, BCPS 11/16/2022 8:59 PM

## 2022-11-16 NOTE — ED Notes (Signed)
Patient transported to CT 

## 2022-11-16 NOTE — ED Notes (Signed)
Pt placed on 2L [place]

## 2022-11-16 NOTE — ED Provider Notes (Addendum)
[place] EMERGENCY DEPARTMENT AT [facility] Provider Note   CSN: [id number] Arrival date & time: 11/16/22  2003     History  Chief Complaint  Patient presents with   Fever    Alex Cortez is a 72 y.o. male.  This is a 72 year old male who comes to the emergency department today due to fever and dyspnea.  Patient has a past medical history of B-cell lymphoma, his recently finished chemotherapy cycle.  Wife is present who also helps provide history.  She says that beginning this morning, the patient was very weak, fatigued and febrile.  She had a system with things like getting up.  Patient denies any abdominal pain, chest pain, pain in his extremities.   Fever      Home Medications Prior to Admission medications   Medication Sig Start Date End Date Taking? Authorizing Provider  allopurinol (ZYLOPRIM) 300 MG tablet TAKE 1 TABLET BY MOUTH EVERY DAY 08/30/22  Yes Walisiewicz, Kaitlyn E, PA-C  amLODipine (NORVASC) 10 MG tablet Take 10 mg by mouth daily. 10/05/18  Yes [provider]  carvedilol (COREG) 25 MG tablet TAKE 1 TABLET BY MOUTH TWICE A DAY 11/08/22  Yes Branch, Dorothe Pea, MD  clopidogrel (PLAVIX) 75 MG tablet Take 1 tablet (75 mg total) by mouth daily. 11/28/17  Yes McCue, Shanda Bumps, NP  ezetimibe (ZETIA) 10 MG tablet TAKE 1 TABLET BY MOUTH EVERY DAY 04/26/22  Yes Branch, Dorothe Pea, MD  FARXIGA 5 MG TABS tablet TAKE 1 TABLET BY MOUTH EVERY DAY BEFORE BREAKFAST 10/12/22  Yes Nida, Denman George, MD  glipiZIDE (GLUCOTROL XL) 5 MG 24 hr tablet Take 5 mg by mouth daily with breakfast. 05/18/20  Yes [provider]  insulin regular human CONCENTRATED (HUMULIN R U-500 KWIKPEN) 500 UNIT/ML KwikPen INJECT AS DIRECTED 70 UNITS WITH BREAKFAST, 70 UNITS WITH LUNCH, AND 70 UNITS WITH SUPPER FOR PRE-MEAL BLOOD GLUCOSE READINGS OF 90MG /DL OR ABOVE 06/22/79  Yes Nida, Denman George, MD  isosorbide mononitrate (IMDUR) 30 MG 24 hr tablet TAKE 1 TABLET BY MOUTH  EVERY DAY 09/13/22  Yes Antoine Poche, MD  levothyroxine (SYNTHROID) 75 MCG tablet TAKE 1 TABLET BY MOUTH EVERY DAY BEFORE BREAKFAST 08/30/22  Yes Nida, Denman George, MD  lidocaine-prilocaine (EMLA) cream Apply 1 Application topically as needed. Patient taking differently: Apply 1 Application topically daily as needed (port access). 04/29/22  Yes Jaci Standard, MD  losartan (COZAAR) 50 MG tablet Take 1 tablet (50 mg total) by mouth daily. 12/11/21  Yes Branch, Dorothe Pea, MD  metFORMIN (GLUCOPHAGE) 500 MG tablet TAKE 1 TABLET BY MOUTH EVERY DAY WITH BREAKFAST 08/30/22  Yes Nida, Denman George, MD  nitroGLYCERIN (NITROSTAT) 0.4 MG SL tablet Place 1 tablet (0.4 mg total) under the tongue every 5 (five) minutes as needed. 08/30/11  Yes de Melanie Crazier, MD  rosuvastatin (CRESTOR) 40 MG tablet TAKE 1 TABLET BY MOUTH EVERY DAY 07/07/22  Yes Branch, Dorothe Pea, MD  Continuous Blood Gluc Receiver (FREESTYLE LIBRE 2 READER) DEVI As directed 08/21/21   Roma Kayser, MD  Continuous Glucose Sensor (FREESTYLE LIBRE 2 SENSOR) MISC APPLY 1 SENSOR EVERY 14    DAYS 09/23/22   Roma Kayser, MD  ONE TOUCH ULTRA TEST test strip USE TO TEST BLOOD SUGAR FOUR TIMES DAILY E11.65 12/16/15   Roma Kayser, MD  predniSONE (DELTASONE) 10 MG tablet Take 6 tablets (60 mg total) by mouth daily with breakfast. Take 6 tablets on Day 1  of chemotherapy and continue for 5 days. Repeat with each chemotherapy cycle. Patient not taking: Reported on 08/10/2022 06/23/22   Jaci Standard, MD      Allergies    Ace inhibitors, Other, Rituxan [rituximab], and Shrimp [shellfish allergy]    Review of Systems   Review of Systems  Constitutional:  Positive for fever.    Physical Exam Updated Vital Signs BP 123/62 (BP Location: Right Arm)   Pulse 89   Temp (!) 100.9 F (38.3 C) (Oral)   Resp (!) 24   Ht 6\' 3"  (1.905 m)   Wt 126.2 kg   SpO2 97%   BMI 34.77 kg/m  Physical Exam Vitals reviewed.   Constitutional:      Appearance: He is ill-appearing.  HENT:     Head: Normocephalic.     Mouth/Throat:     Mouth: Mucous membranes are dry.  Cardiovascular:     Rate and Rhythm: Tachycardia present.     Pulses: Normal pulses.  Pulmonary:     Breath sounds: No wheezing or rales.     Comments: Tachypneic Abdominal:     Palpations: Abdomen is soft.  Skin:    General: Skin is warm and dry.     Findings: No rash.  Neurological:     General: No focal deficit present.     Mental Status: He is alert and oriented to person, place, and time.     ED Results / Procedures / Treatments   Labs (all labs ordered are listed, but only abnormal results are displayed) Labs Reviewed  COMPREHENSIVE METABOLIC PANEL - Abnormal; Notable for the following components:      Result Value   Creatinine, Ser 1.25 (*)    All other components within normal limits  CBC WITH DIFFERENTIAL/PLATELET - Abnormal; Notable for the following components:   WBC 12.1 (*)    Hemoglobin 12.2 (*)    Neutro Abs 8.2 (*)    Abs Immature Granulocytes 1.50 (*)    All other components within normal limits  BLOOD GAS, VENOUS - Abnormal; Notable for the following components:   Bicarbonate 29.2 (*)    Acid-Base Excess 4.0 (*)    All other components within normal limits  I-STAT CHEM 8, ED - Abnormal; Notable for the following components:   Creatinine, Ser 1.30 (*)    All other components within normal limits  RESP PANEL BY RT-PCR (RSV, FLU A&B, COVID)  RVPGX2  CULTURE, BLOOD (ROUTINE X 2)  CULTURE, BLOOD (ROUTINE X 2)  LACTIC ACID, PLASMA  PROTIME-INR  APTT  URINALYSIS, W/ REFLEX TO CULTURE (INFECTION SUSPECTED)    EKG EKG Interpretation Date/Time:  Tuesday November 16 2022 20:16:52 EDT Ventricular Rate:  103 PR Interval:  173 QRS Duration:  82 QT Interval:  319 QTC Calculation: 418 R Axis:   22  Text Interpretation: Sinus tachycardia Low voltage, precordial leads Borderline T wave abnormalities Confirmed by  Anders Simmonds [phone]) on 11/16/2022 9:17:28 PM  Radiology DG Chest Port 1 View  Result Date: 11/16/2022 CLINICAL DATA:  Question of sepsis. Unwell feeling for 2 days. History of cancer with last chemotherapy treatment 1 month ago. EXAM: PORTABLE CHEST 1 VIEW COMPARISON:  07/11/2022 FINDINGS: Shallow inspiration. Cardiac enlargement. Lungs are clear. No pleural effusions. No pneumothorax. Mediastinal contours appear intact. Power port type central venous catheter with tip over the cavoatrial junction region. IMPRESSION: Shallow inspiration. Cardiac enlargement. No evidence of active pulmonary disease. Electronically Signed   By: Burman Nieves M.D.   On:  11/16/2022 22:09    Procedures Procedures    Medications Ordered in ED Medications  lactated ringers infusion ( Intravenous New Bag/Given 11/16/22 2124)  ceFEPIme (MAXIPIME) 2 g in sodium chloride 0.9 % 100 mL IVPB (has no administration in time range)  vancomycin (VANCOREADY) IVPB 1750 mg/350 mL (has no administration in time range)  vancomycin (VANCOCIN) IVPB 1000 mg/200 mL premix (1,000 mg Intravenous New Bag/Given 11/16/22 2226)  lactated ringers bolus 1,000 mL (has no administration in time range)  ceFEPIme (MAXIPIME) 2 g in sodium chloride 0.9 % 100 mL IVPB (0 g Intravenous Stopped 11/16/22 2121)  lactated ringers bolus 1,000 mL (0 mLs Intravenous Stopped 11/16/22 2140)  acetaminophen (TYLENOL) tablet 650 mg (650 mg Oral Given 11/16/22 2041)  vancomycin (VANCOCIN) IVPB 1000 mg/200 mL premix (0 mg Intravenous Stopped 11/16/22 2225)  ketorolac (TORADOL) 30 MG/ML injection 9.9 mg (9.9 mg Intravenous Given 11/16/22 2155)    ED Course/ Medical Decision Making/ A&P                                 Medical Decision Making 72 year old male here today with fever and tachypnea.  Differential diagnoses include COVID-19, pneumonia, less likely PE.  Plan-patient is tachypneic with a fever.  We are currently seeing high volumes of COVID-19, so my suspicion  is that this is likely what the patient has.  However, with his immunocompromise status, we will be aggressive and treat patient with broad-spectrum antibiotics as this could certainly be an underlying pneumonia or bacteremia.  Blood cultures have been ordered.  Patient was tachypneic to the mid 30s.  Ordered BiPAP for this patient.  VBG ordered.  Reassessment-patient ultimately did not require BiPAP.  Improved with antipyretics.  Patient's plain film was negative, however with his respiratory symptoms I thought that there was likely was an underlying infection.  My read of the patient CT scan does show a right lower lobe pneumonia.  Patient being treated with antibiotics.  I believe that this aligns appropriately with the patient's symptoms.  Will admit to the hospitalist for sepsis, pneumonia.  I have reevaluated the patient following IV antibiotic administration.  Amount and/or Complexity of Data Reviewed Labs: ordered. Radiology: ordered. ECG/medicine tests: ordered.  Risk OTC drugs. Prescription drug management. Decision regarding hospitalization.           Final Clinical Impression(s) / ED Diagnoses Final diagnoses:  Sepsis without acute organ dysfunction, due to unspecified organism [facility])  Pneumonia due to infectious organism, unspecified laterality, unspecified part of lung    Rx / DC Orders ED Discharge Orders     None         Arletha Pili, DO 11/16/22 2300    Anders Simmonds T, DO 11/16/22 2313

## 2022-11-17 ENCOUNTER — Telehealth: Payer: Self-pay | Admitting: *Deleted

## 2022-11-17 DIAGNOSIS — I2510 Atherosclerotic heart disease of native coronary artery without angina pectoris: Secondary | ICD-10-CM | POA: Diagnosis not present

## 2022-11-17 DIAGNOSIS — A419 Sepsis, unspecified organism: Secondary | ICD-10-CM | POA: Diagnosis not present

## 2022-11-17 DIAGNOSIS — I10 Essential (primary) hypertension: Secondary | ICD-10-CM

## 2022-11-17 DIAGNOSIS — I5032 Chronic diastolic (congestive) heart failure: Secondary | ICD-10-CM | POA: Diagnosis not present

## 2022-11-17 DIAGNOSIS — Z794 Long term (current) use of insulin: Secondary | ICD-10-CM

## 2022-11-17 DIAGNOSIS — J189 Pneumonia, unspecified organism: Secondary | ICD-10-CM

## 2022-11-17 DIAGNOSIS — E785 Hyperlipidemia, unspecified: Secondary | ICD-10-CM

## 2022-11-17 DIAGNOSIS — E119 Type 2 diabetes mellitus without complications: Secondary | ICD-10-CM

## 2022-11-17 LAB — URINALYSIS, W/ REFLEX TO CULTURE (INFECTION SUSPECTED)
Bilirubin Urine: NEGATIVE
Glucose, UA: >=500 mg/dL — AB
Ketones, ur: NEGATIVE mg/dL
Leukocytes,Ua: NEGATIVE
Nitrite: NEGATIVE
Protein, ur: 30 mg/dL — AB
Specific Gravity, Urine: 1.022 (ref 1.005–1.030)
pH: 5 (ref 5.0–8.0)

## 2022-11-17 LAB — BASIC METABOLIC PANEL
Anion gap: 9 (ref 5–15)
BUN: 19 mg/dL (ref 8–23)
CO2: 25 mmol/L (ref 22–32)
Calcium: 8.4 mg/dL — ABNORMAL LOW (ref 8.9–10.3)
Chloride: 104 mmol/L (ref 98–111)
Creatinine, Ser: 1.23 mg/dL (ref 0.61–1.24)
GFR, Estimated: >60 mL/min (ref >60)
Glucose, Bld: 138 mg/dL — ABNORMAL HIGH (ref 70–99)
Potassium: 4.1 mmol/L (ref 3.5–5.1)
Sodium: 138 mmol/L (ref 135–145)

## 2022-11-17 LAB — CBC
HCT: 37.1 % — ABNORMAL LOW (ref 39.0–52.0)
Hemoglobin: 11.2 g/dL — ABNORMAL LOW (ref 13.0–17.0)
MCH: 25.9 pg — ABNORMAL LOW (ref 26.0–34.0)
MCHC: 30.2 g/dL (ref 30.0–36.0)
MCV: 85.9 fL (ref 80.0–100.0)
Platelets: 159 10*3/uL (ref 150–400)
RBC: 4.32 MIL/uL (ref 4.22–5.81)
RDW: 15.1 % (ref 11.5–15.5)
WBC: 13.6 10*3/uL — ABNORMAL HIGH (ref 4.0–10.5)
nRBC: 0 % (ref 0.0–0.2)

## 2022-11-17 LAB — GLUCOSE, CAPILLARY
Glucose-Capillary: 159 mg/dL — ABNORMAL HIGH (ref 70–99)
Glucose-Capillary: 207 mg/dL — ABNORMAL HIGH (ref 70–99)
Glucose-Capillary: 132 mg/dL — ABNORMAL HIGH (ref 70–99)
Glucose-Capillary: 63 mg/dL — ABNORMAL LOW (ref 70–99)

## 2022-11-17 LAB — MRSA NEXT GEN BY PCR, NASAL: MRSA by PCR Next Gen: NOT DETECTED

## 2022-11-17 LAB — CORTISOL-AM, BLOOD: Cortisol - AM: 16.3 ug/dL (ref 6.7–22.6)

## 2022-11-17 LAB — PROCALCITONIN: Procalcitonin: 6.43 ng/mL

## 2022-11-17 LAB — PROTIME-INR
INR: 1.3 — ABNORMAL HIGH (ref 0.8–1.2)
Prothrombin Time: 16.4 s — ABNORMAL HIGH (ref 11.4–15.2)

## 2022-11-17 MED ORDER — MAGNESIUM HYDROXIDE 400 MG/5ML PO SUSP: 30.0000 mL | Freq: Every day | ORAL | Status: DC | PRN

## 2022-11-17 MED ORDER — DAPAGLIFLOZIN PROPANEDIOL 5 MG PO TABS
5.0000 mg | ORAL_TABLET | Freq: Every day | ORAL | Status: DC
  Administered 2022-11-17 – 2022-11-18 (×2): 5 mg via ORAL
  Filled 2022-11-17 (×2): qty 1

## 2022-11-17 MED ORDER — CARVEDILOL 12.5 MG PO TABS: 25.0000 mg | ORAL_TABLET | Freq: Two times a day (BID) | ORAL | Status: DC

## 2022-11-17 MED ORDER — INSULIN REGULAR HUMAN (CONC) 500 UNIT/ML ~~LOC~~ SOPN
70.0000 [IU] | PEN_INJECTOR | Freq: Three times a day (TID) | SUBCUTANEOUS | Status: DC
  Administered 2022-11-17 (×2): 70 [IU] via SUBCUTANEOUS
  Filled 2022-11-17: qty 3

## 2022-11-17 MED ORDER — EZETIMIBE 10 MG PO TABS
10.0000 mg | ORAL_TABLET | Freq: Every day | ORAL | Status: DC
  Administered 2022-11-17 – 2022-11-18 (×2): 10 mg via ORAL
  Filled 2022-11-17 (×2): qty 1

## 2022-11-17 MED ORDER — METRONIDAZOLE 500 MG/100ML IV SOLN
500.0000 mg | Freq: Two times a day (BID) | INTRAVENOUS | Status: DC
  Administered 2022-11-17 – 2022-11-18 (×3): 500 mg via INTRAVENOUS
  Filled 2022-11-17 (×3): qty 100

## 2022-11-17 MED ORDER — SODIUM CHLORIDE 0.9 % IV SOLN: 2.0000 g | INTRAVENOUS | Status: DC

## 2022-11-17 MED ORDER — GUAIFENESIN ER 600 MG PO TB12
600.0000 mg | ORAL_TABLET | Freq: Two times a day (BID) | ORAL | Status: DC
  Administered 2022-11-17 – 2022-11-18 (×3): 600 mg via ORAL
  Filled 2022-11-17 (×4): qty 1

## 2022-11-17 MED ORDER — LEVOTHYROXINE SODIUM 75 MCG PO TABS
75.0000 ug | ORAL_TABLET | Freq: Every day | ORAL | Status: DC
  Administered 2022-11-17 – 2022-11-18 (×2): 75 ug via ORAL
  Filled 2022-11-17 (×2): qty 1

## 2022-11-17 MED ORDER — CARVEDILOL 12.5 MG PO TABS
25.0000 mg | ORAL_TABLET | Freq: Two times a day (BID) | ORAL | Status: DC
  Administered 2022-11-17: 25 mg via ORAL
  Filled 2022-11-17 (×2): qty 2

## 2022-11-17 MED ORDER — ACETAMINOPHEN 650 MG RE SUPP: 650.0000 mg | Freq: Four times a day (QID) | RECTAL | Status: DC | PRN

## 2022-11-17 MED ORDER — ROSUVASTATIN CALCIUM 20 MG PO TABS
40.0000 mg | ORAL_TABLET | Freq: Every day | ORAL | Status: DC
  Administered 2022-11-17 – 2022-11-18 (×2): 40 mg via ORAL
  Filled 2022-11-17 (×2): qty 2

## 2022-11-17 MED ORDER — ONDANSETRON HCL 4 MG PO TABS: 4.0000 mg | ORAL_TABLET | Freq: Four times a day (QID) | ORAL | Status: DC | PRN

## 2022-11-17 MED ORDER — AMLODIPINE BESYLATE 5 MG PO TABS
10.0000 mg | ORAL_TABLET | Freq: Every day | ORAL | Status: DC
  Filled 2022-11-17: qty 2

## 2022-11-17 MED ORDER — LOSARTAN POTASSIUM 50 MG PO TABS
50.0000 mg | ORAL_TABLET | Freq: Every day | ORAL | Status: DC
  Filled 2022-11-17: qty 1

## 2022-11-17 MED ORDER — INSULIN ASPART 100 UNIT/ML IJ SOLN: 0.0000 [IU] | Freq: Every day | INTRAMUSCULAR | Status: DC

## 2022-11-17 MED ORDER — ENOXAPARIN SODIUM 40 MG/0.4ML IJ SOSY
40.0000 mg | PREFILLED_SYRINGE | INTRAMUSCULAR | Status: DC
  Administered 2022-11-17: 40 mg via SUBCUTANEOUS
  Filled 2022-11-17 (×2): qty 0.4

## 2022-11-17 MED ORDER — ONDANSETRON HCL 4 MG/2ML IJ SOLN: 4.0000 mg | Freq: Four times a day (QID) | INTRAMUSCULAR | Status: DC | PRN

## 2022-11-17 MED ORDER — GLIPIZIDE ER 5 MG PO TB24
5.0000 mg | ORAL_TABLET | Freq: Every day | ORAL | Status: DC
  Filled 2022-11-17: qty 1

## 2022-11-17 MED ORDER — CLOPIDOGREL BISULFATE 75 MG PO TABS
75.0000 mg | ORAL_TABLET | Freq: Every day | ORAL | Status: DC
  Administered 2022-11-17 – 2022-11-18 (×2): 75 mg via ORAL
  Filled 2022-11-17 (×2): qty 1

## 2022-11-17 MED ORDER — CARVEDILOL 12.5 MG PO TABS
12.5000 mg | ORAL_TABLET | Freq: Two times a day (BID) | ORAL | Status: DC
  Administered 2022-11-17 – 2022-11-18 (×2): 12.5 mg via ORAL
  Filled 2022-11-17 (×2): qty 1

## 2022-11-17 MED ORDER — ACETAMINOPHEN 325 MG PO TABS
650.0000 mg | ORAL_TABLET | Freq: Four times a day (QID) | ORAL | Status: DC | PRN
  Administered 2022-11-18: 650 mg via ORAL
  Filled 2022-11-17: qty 2

## 2022-11-17 MED ORDER — IPRATROPIUM-ALBUTEROL 0.5-2.5 (3) MG/3ML IN SOLN
3.0000 mL | Freq: Four times a day (QID) | RESPIRATORY_TRACT | Status: DC
  Administered 2022-11-17: 3 mL via RESPIRATORY_TRACT
  Filled 2022-11-17: qty 3

## 2022-11-17 MED ORDER — INSULIN ASPART 100 UNIT/ML IJ SOLN
0.0000 [IU] | Freq: Three times a day (TID) | INTRAMUSCULAR | Status: DC
  Administered 2022-11-17: 3 [IU] via SUBCUTANEOUS
  Administered 2022-11-17: 2 [IU] via SUBCUTANEOUS
  Administered 2022-11-17: 5 [IU] via SUBCUTANEOUS
  Administered 2022-11-18: 3 [IU] via SUBCUTANEOUS

## 2022-11-17 MED ORDER — LACTATED RINGERS IV SOLN
150.0000 mL/h | INTRAVENOUS | Status: DC
  Administered 2022-11-17 (×2): 150 mL/h via INTRAVENOUS

## 2022-11-17 MED ORDER — INSULIN REGULAR HUMAN (CONC) 500 UNIT/ML ~~LOC~~ SOPN
60.0000 [IU] | PEN_INJECTOR | Freq: Three times a day (TID) | SUBCUTANEOUS | Status: DC
  Administered 2022-11-17 – 2022-11-18 (×2): 60 [IU] via SUBCUTANEOUS

## 2022-11-17 MED ORDER — ISOSORBIDE MONONITRATE ER 60 MG PO TB24
30.0000 mg | ORAL_TABLET | Freq: Every day | ORAL | Status: DC
  Administered 2022-11-17: 30 mg via ORAL
  Filled 2022-11-17: qty 1

## 2022-11-17 MED ORDER — IPRATROPIUM-ALBUTEROL 0.5-2.5 (3) MG/3ML IN SOLN
3.0000 mL | Freq: Three times a day (TID) | RESPIRATORY_TRACT | Status: DC
  Administered 2022-11-17 – 2022-11-18 (×3): 3 mL via RESPIRATORY_TRACT
  Filled 2022-11-17 (×3): qty 3

## 2022-11-17 MED ORDER — HYDROCOD POLI-CHLORPHE POLI ER 10-8 MG/5ML PO SUER: 5.0000 mL | Freq: Two times a day (BID) | ORAL | Status: DC | PRN

## 2022-11-17 MED ORDER — NITROGLYCERIN 0.4 MG SL SUBL: 0.4000 mg | SUBLINGUAL_TABLET | SUBLINGUAL | Status: DC | PRN

## 2022-11-17 MED ORDER — TRAZODONE HCL 50 MG PO TABS: 25.0000 mg | ORAL_TABLET | Freq: Every evening | ORAL | Status: DC | PRN

## 2022-11-17 MED ORDER — SODIUM CHLORIDE 0.9 % IV SOLN: 500.0000 mg | INTRAVENOUS | Status: DC

## 2022-11-17 MED ORDER — ALLOPURINOL 300 MG PO TABS
300.0000 mg | ORAL_TABLET | Freq: Every day | ORAL | Status: DC
  Administered 2022-11-17 – 2022-11-18 (×2): 300 mg via ORAL
  Filled 2022-11-17: qty 3
  Filled 2022-11-17: qty 1

## 2022-11-17 NOTE — Assessment & Plan Note (Signed)
-   We will continue Synthroid. 

## 2022-11-17 NOTE — Assessment & Plan Note (Signed)
-   We will continue his diabetic regimen with Humulin RU, Glucotrol XL and Farxiga. - We will hold off metformin.

## 2022-11-17 NOTE — TOC Initial Note (Signed)
Transition of Care [facility]) - Initial/Assessment Note    Patient Details  Name: Alex Cortez MRN: [id number] Date of Birth: 1950/06/12  Transition of Care [facility]) CM/SW Contact:    Villa Herb, LCSWA Phone Number: 11/17/2022, 10:57 AM  Clinical Narrative:                 Pt is high risk for readmission. CSW spoke with pts spouse to complete assessment. Pt is independent in completing his ADLs and drives self to appointments. Pts spouse states that last time he was in the hospital he went home with [facility] for a short time. Pt does not use any DME in the home. TOC to follow.   Expected Discharge Plan: Home/Self Care Barriers to Discharge: Continued Medical Work up   Patient Goals and CMS Choice Patient states their goals for this hospitalization and ongoing recovery are:: return home CMS Medicare.gov Compare Post Acute Care list provided to:: Patient Represenative (must comment) Choice offered to / list presented to : Spouse      Expected Discharge Plan and Services In-house Referral: Clinical Social Work Discharge Planning Services: CM Consult   Living arrangements for the past 2 months: Single Family Home                                      Prior Living Arrangements/Services Living arrangements for the past 2 months: Single Family Home Lives with:: Spouse Patient language and need for interpreter reviewed:: Yes Do you feel safe going back to the place where you live?: Yes      Need for Family Participation in Patient Care: Yes (Comment) Care giver support system in place?: Yes (comment)   Criminal Activity/Legal Involvement Pertinent to Current Situation/Hospitalization: No - Comment as needed  Activities of Daily Living Home Assistive Devices/Equipment: Cane (specify quad or straight) ADL Screening (condition at time of admission) Patient's cognitive ability adequate to safely complete daily activities?: Yes Is the patient deaf or have difficulty hearing?:  No Does the patient have difficulty seeing, even when wearing glasses/contacts?: No Does the patient have difficulty concentrating, remembering, or making decisions?: No Patient able to express need for assistance with ADLs?: Yes Does the patient have difficulty dressing or bathing?: No Independently performs ADLs?: Yes (appropriate for developmental age) Does the patient have difficulty walking or climbing stairs?: Yes Weakness of Legs: Both Weakness of Arms/Hands: None  Permission Sought/Granted                  Emotional Assessment Appearance:: Appears stated age       Alcohol / Substance Use: Not Applicable Psych Involvement: No (comment)  Admission diagnosis:  Sepsis due to pneumonia (HCC) [J18.9, A41.9] Pneumonia due to infectious organism, unspecified laterality, unspecified part of lung [J18.9] Sepsis without acute organ dysfunction, due to unspecified organism [facility]) [A41.9] Patient Active Problem List   Diagnosis Date Noted   Dyslipidemia 11/17/2022   Type 2 diabetes mellitus without complications (HCC) 11/17/2022   Chronic diastolic CHF (congestive heart failure) (HCC) 11/17/2022   Coronary artery disease 11/17/2022   Sepsis due to pneumonia (HCC) 11/16/2022   Insulin long-term use (HCC) 08/10/2022   Abscess of right groin 07/12/2022   Sepsis (HCC) 07/11/2022   AKI (acute kidney injury) (HCC) 07/11/2022   Port-A-Cath in place 05/05/2022   DLBCL (diffuse large B cell lymphoma) (HCC) 04/23/2022   Class 2 severe obesity due to excess calories  with serious comorbidity and body mass index (BMI) of 35.0 to 35.9 in adult [facility]) 12/28/2021   Hypothyroidism 01/12/2018   Cerebral embolism with cerebral infarction 08/01/2017   CVA (cerebral vascular accident) (HCC) 08/01/2017   History of CVA with residual deficit    Coronary artery disease involving native coronary artery of native heart without angina pectoris    Essential hypertension    Diabetes mellitus type 2 in  obese    Class 1 obesity due to excess calories with serious comorbidity and body mass index (BMI) of 32.0 to 32.9 in adult 09/09/2015   Sedentary lifestyle 09/09/2015   Mixed hyperlipidemia 02/24/2015   Essential hypertension, benign 02/24/2015   Exertional dyspnea 10/18/2012   Diastolic heart failure (HCC) 10/01/2011   Obstructive sleep apnea 09/02/2011   LV dysfunction 09/02/2011   ERECTILE DYSFUNCTION, SECONDARY TO MEDICATION 04/14/2010   Type 2 diabetes mellitus with vascular disease (HCC) 10/02/2009   ACUT MI SUBENDOCARDIAL INFARCT INIT EPIS CARE 10/02/2009   CORONARY ATHEROSCLEROSIS NATIVE CORONARY ARTERY 10/02/2009   CHEST PAIN 10/02/2009   PCP:  Juliette Alcide, MD Pharmacy:   [phone] [place], [place] - [address] AT [facility] [address] [place] [address] Phone: [phone] Fax: [phone]  [facility] [place], [place] - [address] [facility] AT Portal to Registered [facility] Sites [address] [place] [address] Phone: [phone] Fax: [phone]  [phone] [place] [place] - [address] [place] [address] Phone: [phone] Fax: [phone]     Social Determinants of Health (SDOH) Social History: SDOH Screenings   Food Insecurity: No Food Insecurity (11/17/2022)  Housing: Low Risk  (11/17/2022)  Transportation Needs: No Transportation Needs (11/17/2022)  Utilities: Not At Risk (11/17/2022)  Depression (PHQ2-9): Low Risk  (04/07/2022)  Financial Resource Strain: Low Risk  (10/15/2022)  Physical Activity: Inactive (05/24/2022)  Social Connections: Unknown (07/31/2017)  Stress: No Stress Concern Present (04/07/2022)  Tobacco Use: Medium Risk (10/15/2022)  Health Literacy: Low Risk  (05/25/2021)   [facility] from [facility], [facility]   SDOH Interventions:     Readmission Risk Interventions    11/17/2022   10:57 AM 07/14/2022   12:04 PM   Readmission Risk Prevention Plan  Transportation Screening Complete Complete  HRI or Home Care Consult Complete Complete  Social Work Consult for Recovery Care Planning/Counseling Complete Complete  Palliative Care Screening Not Applicable Not Applicable  Medication Review [employer]) Complete Complete

## 2022-11-17 NOTE — Assessment & Plan Note (Signed)
-   We will continue his statin therapy and Zetia.

## 2022-11-17 NOTE — Assessment & Plan Note (Signed)
-   We will continue his antihypertensives. 

## 2022-11-17 NOTE — Inpatient Diabetes Management (Signed)
Inpatient Diabetes Program Recommendations  AACE/ADA: New Consensus Statement on Inpatient Glycemic Control (2015)  Target Ranges:  Prepandial:   less than 140 mg/dL      Peak postprandial:   less than 180 mg/dL (1-2 hours)      Critically ill patients:  140 - 180 mg/dL   Lab Results  Component Value Date   GLUCAP 207 (H) 11/17/2022   HGBA1C 8.5 (H) 07/11/2022    Latest Reference Range & Units 11/17/22 08:16 11/17/22 11:19  Glucose-Capillary 70 - 99 mg/dL 161 (H) 096 (H)  (H): Data is abnormally high  Diabetes history: DM2 Outpatient Diabetes medications: U-500 insulin 70 units tid ac meals, Farxiga 5 mg q day, Metformin 500 mg qday, Glucotrol 5 mg qday Current orders for Inpatient glycemic control: Novolog U-500 70 units tid, Farxiga 5 mg qday, Novolog 0-15 units tid, 0-5 units hs  Inpatient Diabetes Program Recommendations:   Noted patient eating 50% meals today. Please consider: -Decrease U-500 to 60 units tid ac meals  Thank you, Billy Fischer. Zygmund Passero, RN, MSN, CDE  Diabetes Coordinator Inpatient Glycemic Control Team Team Pager [phone] (8am-5pm) 11/17/2022 1:48 PM

## 2022-11-17 NOTE — H&P (Signed)
Alex Cortez   PATIENT NAME: Alex Cortez    MR#:  [id number]  DATE OF BIRTH:  03-19-1950  DATE OF ADMISSION:  11/16/2022  PRIMARY CARE PHYSICIAN: Juliette Alcide, MD   Patient is coming from: Home  REQUESTING/REFERRING PHYSICIAN: Anders Simmonds, MD  CHIEF COMPLAINT:   Chief Complaint  Patient presents with   Fever    HISTORY OF PRESENT ILLNESS:  Alex Cortez is a 72 y.o. African American male with medical history significant for coronary artery disease, dyslipidemia, hypertension, diffuse large B cell lymphoma, hypothyroidism and CVA, who presented to the emergency room with acute onset of fever and cold chills and malaise with significant diminished appetite.  He admitted to dyspnea and mild cough with inability to expectorate without wheezing.  No dysuria, oliguria or hematuria, urinary frequency or urgency or flank pain.  No chest pain or palpitations.  His last chemotherapy was on 6/21.  ED Course: When the patient came to the ER, heart rate was 110 and temperature 102.2 with blood pressure of 138/90, respiratory rate of 20 and pulse oximetry of 88% on room air and 95% on 2 L of oxygen by nasal cannula.  Labs revealed VBG with pH 7.49 and pCO2 49 pO2 33 and HCO3 29.2.  CMP revealed a creatinine of 1.25 and later 1.3.  Lactic acid was 1.6 and CBC showed leukocytosis of 12.1 with neutrophilia.  Influenza A and B, COVID-19 PCR and RSV PCR came back negative.  Blood cultures came back negative. EKG as reviewed by me : Sinus tachycardia with rate 103 with low voltage QRS. Imaging: Portable chest x-ray showed shallow inspiration and cardiomegaly with no acute cardiopulmonary disease.  Chest CTA revealed: Nodular airspace disease in the right middle lobe, right lower lobe and to a lesser extent left lower lobe, likely pneumonia. Followup chest CT is recommended in 3-4 weeks following trial of antibiotic therapy to ensure resolution and exclude underlying malignancy.    Diffuse coronary artery disease.   Dependent and bibasilar atelectasis.   Aortic Atherosclerosis.  The patient was given 1 L bolus of IV lactated Ringer, IV cefepime and vancomycin as well as 650 mg p.o. Tylenol.  He will be admitted to a medical telemetry bed for further evaluation and management.  PAST MEDICAL HISTORY:   Past Medical History:  Diagnosis Date   CAD (coronary artery disease)    nonobstructive CAD/patent LAD stent site; EF 65%, 08/2011; NSTEMI/DES LAD, 6/20 11   Chest pain    Diabetes mellitus    insulin-dependent   Dyslipidemia    HTN (hypertension)    Hypothyroidism    Myocardial infarction (HCC) 2011   Post MI syndrome (HCC) 08/29/2009   Stroke (HCC) 10/22/2012   Left inferior pons    PAST SURGICAL HISTORY:   Past Surgical History:  Procedure Laterality Date   COLONOSCOPY  08/2021   CORONARY ANGIOPLASTY WITH STENT PLACEMENT  2011   IR IMAGING GUIDED PORT INSERTION  04/26/2022   LYMPH NODE BIOPSY Right 04/14/2022   Procedure: RIGHT INGUINAL EXCISIONAL LYMPH NODE BIOPSY;  Surgeon: Fritzi Mandes, MD;  Location: [facility];  Service: General;  Laterality: Right;    SOCIAL HISTORY:   Social History   Tobacco Use   Smoking status: Former    Current packs/day: 0.00    Average packs/day: 0.5 packs/day for 33.0 years (16.5 ttl pk-yrs)    Types: Cigarettes    Start date: 03/16/1955    Quit date: 03/15/1988  Years since quitting: 34.6   Smokeless tobacco: Never  Substance Use Topics   Alcohol use: No    Alcohol/week: 0.0 standard drinks of alcohol    FAMILY HISTORY:   Family History  Problem Relation Age of Onset   Colon cancer Mother    Hypertension Other        in all family members    DRUG ALLERGIES:   Allergies  Allergen Reactions   Ace Inhibitors Swelling    Angioedema   Other Swelling    Shellfish   Swelling and hives  Shellfish  Swelling and hives   Rituxan [Rituximab] Other (See Comments)    Pt had sensitivity reaction to Rituxan.  See progress note from 05/14/22. Pt able to complete infusion.    Shrimp [Shellfish Allergy] Hives and Swelling    REVIEW OF SYSTEMS:   ROS As per history of present illness. All pertinent systems were reviewed above. Constitutional, HEENT, cardiovascular, respiratory, GI, GU, musculoskeletal, neuro, psychiatric, endocrine, integumentary and hematologic systems were reviewed and are otherwise negative/unremarkable except for positive findings mentioned above in the HPI.   MEDICATIONS AT HOME:   Prior to Admission medications   Medication Sig Start Date End Date Taking? Authorizing Provider  allopurinol (ZYLOPRIM) 300 MG tablet TAKE 1 TABLET BY MOUTH EVERY DAY 08/30/22  Yes Walisiewicz, Kaitlyn E, PA-C  amLODipine (NORVASC) 10 MG tablet Take 10 mg by mouth daily. 10/05/18  Yes [provider]  carvedilol (COREG) 25 MG tablet TAKE 1 TABLET BY MOUTH TWICE A DAY 11/08/22  Yes Branch, Dorothe Pea, MD  clopidogrel (PLAVIX) 75 MG tablet Take 1 tablet (75 mg total) by mouth daily. 11/28/17  Yes McCue, Shanda Bumps, NP  ezetimibe (ZETIA) 10 MG tablet TAKE 1 TABLET BY MOUTH EVERY DAY 04/26/22  Yes Branch, Dorothe Pea, MD  FARXIGA 5 MG TABS tablet TAKE 1 TABLET BY MOUTH EVERY DAY BEFORE BREAKFAST 10/12/22  Yes Nida, Denman George, MD  glipiZIDE (GLUCOTROL XL) 5 MG 24 hr tablet Take 5 mg by mouth daily with breakfast. 05/18/20  Yes [provider]  insulin regular human CONCENTRATED (HUMULIN R U-500 KWIKPEN) 500 UNIT/ML KwikPen INJECT AS DIRECTED 70 UNITS WITH BREAKFAST, 70 UNITS WITH LUNCH, AND 70 UNITS WITH SUPPER FOR PRE-MEAL BLOOD GLUCOSE READINGS OF 90MG /DL OR ABOVE 4/78/29  Yes Nida, Denman George, MD  isosorbide mononitrate (IMDUR) 30 MG 24 hr tablet TAKE 1 TABLET BY MOUTH EVERY DAY 09/13/22  Yes Antoine Poche, MD  levothyroxine (SYNTHROID) 75 MCG tablet TAKE 1 TABLET BY MOUTH EVERY DAY BEFORE BREAKFAST 08/30/22  Yes Nida, Denman George, MD  lidocaine-prilocaine (EMLA) cream Apply 1  Application topically as needed. Patient taking differently: Apply 1 Application topically daily as needed (port access). 04/29/22  Yes Jaci Standard, MD  losartan (COZAAR) 50 MG tablet Take 1 tablet (50 mg total) by mouth daily. 12/11/21  Yes Branch, Dorothe Pea, MD  metFORMIN (GLUCOPHAGE) 500 MG tablet TAKE 1 TABLET BY MOUTH EVERY DAY WITH BREAKFAST 08/30/22  Yes Nida, Denman George, MD  nitroGLYCERIN (NITROSTAT) 0.4 MG SL tablet Place 1 tablet (0.4 mg total) under the tongue every 5 (five) minutes as needed. 08/30/11  Yes de Melanie Crazier, MD  rosuvastatin (CRESTOR) 40 MG tablet TAKE 1 TABLET BY MOUTH EVERY DAY 07/07/22  Yes Branch, Dorothe Pea, MD  Continuous Blood Gluc Receiver (FREESTYLE LIBRE 2 READER) DEVI As directed 08/21/21   Roma Kayser, MD  Continuous Glucose Sensor (FREESTYLE LIBRE 2 SENSOR) MISC APPLY 1 SENSOR  EVERY 14    DAYS 09/23/22   Roma Kayser, MD  ONE TOUCH ULTRA TEST test strip USE TO TEST BLOOD SUGAR FOUR TIMES DAILY E11.65 12/16/15   Roma Kayser, MD  predniSONE (DELTASONE) 10 MG tablet Take 6 tablets (60 mg total) by mouth daily with breakfast. Take 6 tablets on Day 1 of chemotherapy and continue for 5 days. Repeat with each chemotherapy cycle. Patient not taking: Reported on 08/10/2022 06/23/22   Jaci Standard, MD      VITAL SIGNS:  Blood pressure 134/79, pulse 82, temperature 97.9 F (36.6 C), temperature source Oral, resp. rate 17, height 6\' 3"  (1.905 m), weight 126.2 kg, SpO2 100%.  PHYSICAL EXAMINATION:  Physical Exam  GENERAL:  72 y.o.-year-old African-American male patient lying in the bed with mild respiratory distress with conversational dyspnea. EYES: Pupils equal, round, reactive to light and accommodation. No scleral icterus. Extraocular muscles intact.  HEENT: Head atraumatic, normocephalic. Oropharynx and nasopharynx clear.  NECK:  Supple, no jugular venous distention. No thyroid enlargement, no tenderness.  LUNGS: Diminished  right basal midlung zone on breath sounds with associated crackles.  No use of accessory muscles of respiration.  CARDIOVASCULAR: Regular rate and rhythm, S1, S2 normal. No murmurs, rubs, or gallops.  ABDOMEN: Soft, nondistended, nontender. Bowel sounds present. No organomegaly or mass.  EXTREMITIES: No pedal edema, cyanosis, or clubbing.  NEUROLOGIC: Cranial nerves II through XII are intact. Muscle strength 5/5 in all extremities. Sensation intact. Gait not checked.  PSYCHIATRIC: The patient is alert and oriented x 3.  Normal affect and good eye contact. SKIN: No obvious rash, lesion, or ulcer.   LABORATORY PANEL:   CBC Recent Labs  Lab 11/16/22 2035 11/16/22 2047  WBC 12.1*  --   HGB 12.2* 13.3  HCT 39.0 39.0  PLT 195  --    ------------------------------------------------------------------------------------------------------------------  Chemistries  Recent Labs  Lab 11/16/22 2035 11/16/22 2047  NA 140 143  K 3.8 3.8  CL 107 107  CO2 23  --   GLUCOSE 91 86  BUN 18 17  CREATININE 1.25* 1.30*  CALCIUM 9.2  --   AST 31  --   ALT 24  --   ALKPHOS 55  --   BILITOT 0.8  --    ------------------------------------------------------------------------------------------------------------------  Cardiac Enzymes No results for input(s): "TROPONINI" in the last 168 hours. ------------------------------------------------------------------------------------------------------------------  RADIOLOGY:  CT Chest Wo Contrast  Result Date: 11/16/2022 CLINICAL DATA:  Pneumonia, complication suspected, xray done. Sepsis. Shortness of breath, fever. EXAM: CT CHEST WITHOUT CONTRAST TECHNIQUE: Multidetector CT imaging of the chest was performed following the standard protocol without IV contrast. RADIATION DOSE REDUCTION: This exam was performed according to the departmental dose-optimization program which includes automated exposure control, adjustment of the mA and/or kV according to  patient size and/or use of iterative reconstruction technique. COMPARISON:  Chest x-ray today.  PET CT 10/25/2022. FINDINGS: Cardiovascular: Heart is mildly enlarged. Diffuse coronary artery and moderate aortic calcifications. No aneurysm. Mediastinum/Nodes: No mediastinal, hilar, or axillary adenopathy. Trachea and esophagus are unremarkable. Thyroid unremarkable. Lungs/Pleura: Nodular airspace disease in the right middle lobe and right lower lobe and to a lesser extent left lower lobe. Findings most compatible with pneumonia. Dependent and bibasilar atelectasis. No effusions. Upper Abdomen: No acute findings Musculoskeletal: Chest wall soft tissues are unremarkable. No acute bony abnormality. IMPRESSION: Nodular airspace disease in the right middle lobe, right lower lobe and to a lesser extent left lower lobe, likely pneumonia. Followup chest CT is recommended  in 3-4 weeks following trial of antibiotic therapy to ensure resolution and exclude underlying malignancy. Diffuse coronary artery disease. Dependent and bibasilar atelectasis. Aortic Atherosclerosis (ICD10-I70.0). Electronically Signed   By: Charlett Nose M.D.   On: 11/16/2022 23:22   DG Chest Port 1 View  Result Date: 11/16/2022 CLINICAL DATA:  Question of sepsis. Unwell feeling for 2 days. History of cancer with last chemotherapy treatment 1 month ago. EXAM: PORTABLE CHEST 1 VIEW COMPARISON:  07/11/2022 FINDINGS: Shallow inspiration. Cardiac enlargement. Lungs are clear. No pleural effusions. No pneumothorax. Mediastinal contours appear intact. Power port type central venous catheter with tip over the cavoatrial junction region. IMPRESSION: Shallow inspiration. Cardiac enlargement. No evidence of active pulmonary disease. Electronically Signed   By: Burman Nieves M.D.   On: 11/16/2022 22:09      IMPRESSION AND PLAN:  Assessment and Plan: * Sepsis due to pneumonia [facility]) - The patient will be admitted to a medical telemetry bed. - Sepsis  manifested by fever, tachycardia and leukocytosis.  Given his hypoxia may meet severe sepsis criteria as well. - We will continue hydration with IV lactated ringer. - Will continue antibiotic therapy with IV cefepime, vancomycin and Flagyl. - Mucolytic therapy be provided as well as duo nebs q.i.d. and q.4 hours p.r.n. - We will follow blood cultures.   Coronary artery disease - We will continue his Imdur, Cozaar, statin therapy, Coreg and Plavix.  Chronic diastolic CHF (congestive heart failure) (HCC) - We will continue ARB therapy as well as [place] and Coreg.  Type 2 diabetes mellitus without complications (HCC) - We will continue his diabetic regimen with Humulin RU, Glucotrol XL and Farxiga. - We will hold off metformin.  Dyslipidemia - We will continue his statin therapy and Zetia.  Hypothyroidism - We will continue Synthroid.  Essential hypertension - We will continue his antihypertensives.    DVT prophylaxis: Lovenox.  Advanced Care Planning:  Code Status: full code.  Family Communication:  The plan of care was discussed in details with the patient (and family). I answered all questions. The patient agreed to proceed with the above mentioned plan. Further management will depend upon hospital course. Disposition Plan: Back to previous home environment Consults called: none.  All the records are reviewed and case discussed with ED provider.  Status is: Inpatient  At the time of the admission, it appears that the appropriate admission status for this patient is inpatient.  This is judged to be reasonable and necessary in order to provide the required intensity of service to ensure the patient's safety given the presenting symptoms, physical exam findings and initial radiographic and laboratory data in the context of comorbid conditions.  The patient requires inpatient status due to high intensity of service, high risk of further deterioration and high frequency of  surveillance required.  I certify that at the time of admission, it is my clinical judgment that the patient will require inpatient hospital care extending more than 2 midnights.                            Dispo: The patient is from: Home              Anticipated d/c is to: Home              Patient currently is not medically stable to d/c.              Difficult to place patient: No  Ontario Pettengill  Bettye Boeck M.D on 11/17/2022 at 3:24 AM  [facility]   From 7 PM-7 AM, contact night-coverage [email or web address]  CC: Primary care physician; Juliette Alcide, MD

## 2022-11-17 NOTE — Assessment & Plan Note (Signed)
-   We will continue ARB therapy as well as [place] and Coreg.

## 2022-11-17 NOTE — Telephone Encounter (Signed)
Received call from pt's wife inquiring about his latest PET Scan. She said her husband is currently in the hospital with pneumonia.  Per Dr. Leonides Schanz, pt's PET scan is good=no new areas of disease and he considered him to be in remission. Maureen Ralphs pleased with these results. She is on her way to see her husband in the hospital now. Dr. Leonides Schanz made aware of pt's in patient status.

## 2022-11-17 NOTE — Plan of Care (Signed)
°  Problem: Coping: °Goal: Level of anxiety will decrease °Outcome: Progressing °  °

## 2022-11-17 NOTE — Assessment & Plan Note (Signed)
-   We will continue his Imdur, Cozaar, statin therapy, Coreg and Plavix.

## 2022-11-17 NOTE — Hospital Course (Signed)
Brief hospital course: PMH of CAD, HLD, HTN, DLBCL, hypothyroidism, CVA, obesity presented to the hospital with complaints of fever and chills with poor p.o. intake. Found to have multifocal pneumonia.  Currently being treated with IV antibiotic.  Assessment and Plan: Sepsis secondary to CAP. Presents with complaints of fatigue and fever and chills. CT chest positive for multifocal pneumonia. Leukocytosis present as well. Meeting criteria for SIRS with fever, tachycardia and leukocytosis. Treated with IV fluid.  Will stop now given blood pressure improvement. Continue with IV antibiotics. Follow-up on cultures.  Pro-Cal 6.43. Monitor.  Currently on 2 LPM.  Try to wean.  History of CAD History of CVA. On Plavix. Currently no new symptoms.  Type 2 diabetes mellitus, uncontrolled with hyperglycemia with long-term insulin use with neuropathy and CKD. On U-500 at home. Able to use in the hospital.  Currently will lower the dose of 60 units 3 times daily Also on glipizide as well as [place].  Holding oral hypoglycemic agent.  HTN. On Norvasc, Coreg, Imdur, losartan, Blood pressure actually soft. Holding all medications. Reducing Coreg dose. Monitor.  HLD. On Zetia and Crestor Continue.  Hypothyroidism. Continue Synthroid.  Obesity Class 1 Body mass index is 34.77 kg/m.  Placing the pt at higher risk of poor outcomes.

## 2022-11-17 NOTE — Progress Notes (Signed)
Mobility Specialist Progress Note:    11/17/22 1400  Mobility  Activity Refused mobility   Pt refused mobility, c/o SOB. SpO2 100% on 2L. Informed pt that O2 sats were within healthy limits and emphasized the plan to monitor O2 sats during ambulation. Pt then stated "my legs feel weak", educated that ambulation would be helpful for strength. Pt then c/o back and hip pain and expressed anxiety about falling. Left pt in bed, all needs met.   Valleri Hendricksen Mobility Specialist Please contact via Special educational needs teacher or  Rehab office at [phone]

## 2022-11-17 NOTE — Assessment & Plan Note (Addendum)
-   The patient will be admitted to a medical telemetry bed. - Sepsis manifested by fever, tachycardia and leukocytosis.  Given his hypoxia may meet severe sepsis criteria as well. - We will continue hydration with IV lactated ringer. - Will continue antibiotic therapy with IV cefepime, vancomycin and Flagyl. - Mucolytic therapy be provided as well as duo nebs q.i.d. and q.4 hours p.r.n. - We will follow blood cultures.

## 2022-11-17 NOTE — Progress Notes (Signed)
[facility] Progress Note Patient: Alex Cortez BJY:[id number] DOB: 02/14/1951 DOA: 11/16/2022  DOS: the patient was seen and examined on 11/17/2022  Brief hospital course: PMH of CAD, HLD, HTN, DLBCL, hypothyroidism, CVA, obesity presented to the hospital with complaints of fever and chills with poor p.o. intake. Found to have multifocal pneumonia.  Currently being treated with IV antibiotic.  Assessment and Plan: Sepsis secondary to CAP. Presents with complaints of fatigue and fever and chills. CT chest positive for multifocal pneumonia. Leukocytosis present as well. Meeting criteria for SIRS with fever, tachycardia and leukocytosis. Treated with IV fluid.  Will stop now given blood pressure improvement. Continue with IV antibiotics. Follow-up on cultures.  Pro-Cal 6.43. Monitor.  Currently on 2 LPM.  Try to wean.  History of CAD History of CVA. On Plavix. Currently no new symptoms.  Type 2 diabetes mellitus, uncontrolled with hyperglycemia with long-term insulin use with neuropathy and CKD. On U-500 at home. Able to use in the hospital.  Currently will lower the dose of 60 units 3 times daily Also on glipizide as well as [place].  Holding oral hypoglycemic agent.  HTN. On Norvasc, Coreg, Imdur, losartan, Blood pressure actually soft. Holding all medications. Reducing Coreg dose. Monitor.  HLD. On Zetia and Crestor Continue.  Hypothyroidism. Continue Synthroid.  Obesity Class 1 Body mass index is 34.77 kg/m.  Placing the pt at higher risk of poor outcomes.   Subjective: Continues to have cough.  No nausea no vomiting.  Does not use any oxygen at home.  No chest pain.  Physical Exam: General: in Mild distress, No Rash Cardiovascular: S1 and S2 Present, No Murmur Respiratory: Good respiratory effort, Bilateral Air entry present.  Bilateral basal crackles, No wheezes Abdomen: Bowel Sound present, No tenderness Extremities: Bilateral chronic right lower than  left edema Neuro: Alert and oriented x3, no new focal deficit  Data Reviewed: I have Reviewed nursing notes, Vitals, and Lab results. I have ordered test including CBC and BMP  . I have discussed pt's care plan and test results with CBC BMP and Pro-Cal  .   Disposition: Status is: Inpatient Remains inpatient appropriate because: Needing IV antibiotics and improvement in oxygen  enoxaparin (LOVENOX) injection 40 mg Start: 11/17/22 1000   Family Communication: No one at bedside Level of care: Telemetry   Vitals:   11/17/22 0811 11/17/22 0831 11/17/22 1310 11/17/22 1507  BP:  (!) 126/58 (!) 107/57   Pulse:  83 77   Resp:  19 18   Temp:  99.4 F (37.4 C) 99.2 F (37.3 C)   TempSrc:  Oral Oral   SpO2: 95% 97% 96% 97%  Weight:      Height:         Author: Lynden Oxford, MD 11/17/2022 4:42 PM  Please look on [email or web address] to find out who is on call.

## 2022-11-17 NOTE — Progress Notes (Signed)
Patient continues to do well on 2lpm [place] and has no need of a BIPAP at this time. RT will have a unit on standby for patient if/when needed.

## 2022-11-18 DIAGNOSIS — J189 Pneumonia, unspecified organism: Secondary | ICD-10-CM

## 2022-11-18 DIAGNOSIS — A419 Sepsis, unspecified organism: Principal | ICD-10-CM

## 2022-11-18 LAB — BASIC METABOLIC PANEL
Anion gap: 12 (ref 5–15)
BUN: 20 mg/dL (ref 8–23)
CO2: 20 mmol/L — ABNORMAL LOW (ref 22–32)
Calcium: 8.3 mg/dL — ABNORMAL LOW (ref 8.9–10.3)
Chloride: 105 mmol/L (ref 98–111)
Creatinine, Ser: 1.09 mg/dL (ref 0.61–1.24)
GFR, Estimated: >60 mL/min (ref >60)
Glucose, Bld: 53 mg/dL — ABNORMAL LOW (ref 70–99)
Potassium: 3.4 mmol/L — ABNORMAL LOW (ref 3.5–5.1)
Sodium: 137 mmol/L (ref 135–145)

## 2022-11-18 LAB — GLUCOSE, CAPILLARY
Glucose-Capillary: 124 mg/dL — ABNORMAL HIGH (ref 70–99)
Glucose-Capillary: 154 mg/dL — ABNORMAL HIGH (ref 70–99)
Glucose-Capillary: 168 mg/dL — ABNORMAL HIGH (ref 70–99)
Glucose-Capillary: 56 mg/dL — ABNORMAL LOW (ref 70–99)
Glucose-Capillary: 56 mg/dL — ABNORMAL LOW (ref 70–99)
Glucose-Capillary: 68 mg/dL — ABNORMAL LOW (ref 70–99)
Glucose-Capillary: 74 mg/dL (ref 70–99)

## 2022-11-18 LAB — CBC
HCT: 33.4 % — ABNORMAL LOW (ref 39.0–52.0)
Hemoglobin: 10.5 g/dL — ABNORMAL LOW (ref 13.0–17.0)
MCH: 26.4 pg (ref 26.0–34.0)
MCHC: 31.4 g/dL (ref 30.0–36.0)
MCV: 83.9 fL (ref 80.0–100.0)
Platelets: 158 10*3/uL (ref 150–400)
RBC: 3.98 MIL/uL — ABNORMAL LOW (ref 4.22–5.81)
RDW: 15.4 % (ref 11.5–15.5)
WBC: 11.5 10*3/uL — ABNORMAL HIGH (ref 4.0–10.5)
nRBC: 0 % (ref 0.0–0.2)

## 2022-11-18 LAB — MAGNESIUM: Magnesium: 1.5 mg/dL — ABNORMAL LOW (ref 1.7–2.4)

## 2022-11-18 MED ORDER — DOXYCYCLINE HYCLATE 100 MG PO TABS: 100.0000 mg | ORAL_TABLET | Freq: Two times a day (BID) | ORAL | 0 refills | Status: AC

## 2022-11-18 MED ORDER — MAGNESIUM OXIDE -MG SUPPLEMENT 400 (240 MG) MG PO TABS
800.0000 mg | ORAL_TABLET | Freq: Every day | ORAL | Status: DC
  Administered 2022-11-18: 800 mg via ORAL
  Filled 2022-11-18: qty 2

## 2022-11-18 MED ORDER — HUMULIN R U-500 KWIKPEN 500 UNIT/ML ~~LOC~~ SOPN: PEN_INJECTOR | SUBCUTANEOUS | Status: DC

## 2022-11-18 MED ORDER — DOXYCYCLINE HYCLATE 100 MG PO TABS
100.0000 mg | ORAL_TABLET | Freq: Two times a day (BID) | ORAL | Status: DC
  Administered 2022-11-18: 100 mg via ORAL
  Filled 2022-11-18: qty 1

## 2022-11-18 MED ORDER — GUAIFENESIN ER 600 MG PO TB12: 600.0000 mg | ORAL_TABLET | Freq: Two times a day (BID) | ORAL | 0 refills | Status: AC

## 2022-11-18 MED ORDER — ENOXAPARIN SODIUM 60 MG/0.6ML IJ SOSY
60.0000 mg | PREFILLED_SYRINGE | INTRAMUSCULAR | Status: DC
  Administered 2022-11-18: 60 mg via SUBCUTANEOUS

## 2022-11-18 MED ORDER — CARVEDILOL 12.5 MG PO TABS: 12.5000 mg | ORAL_TABLET | Freq: Two times a day (BID) | ORAL | 0 refills | Status: DC

## 2022-11-18 MED ORDER — MAGNESIUM SULFATE 2 GM/50ML IV SOLN: 2.0000 g | Freq: Once | INTRAVENOUS | Status: DC

## 2022-11-18 MED ORDER — CEPHALEXIN 500 MG PO CAPS: 500.0000 mg | ORAL_CAPSULE | Freq: Three times a day (TID) | ORAL | Status: DC

## 2022-11-18 MED ORDER — CEPHALEXIN 500 MG PO CAPS: 500.0000 mg | ORAL_CAPSULE | Freq: Three times a day (TID) | ORAL | 0 refills | Status: AC

## 2022-11-18 MED ORDER — INSULIN REGULAR HUMAN (CONC) 500 UNIT/ML ~~LOC~~ SOPN: 45.0000 [IU] | PEN_INJECTOR | Freq: Three times a day (TID) | SUBCUTANEOUS | Status: DC

## 2022-11-18 NOTE — TOC Transition Note (Signed)
Transition of Care [facility] - CM/SW Discharge Note   Patient Details  Name: Alex Cortez MRN: [id number] Date of Birth: 1951-01-07  Transition of [facility]) CM/SW Contact:  Elliot Gault, LCSW Phone Number: 11/18/2022, 11:27 AM   Clinical Narrative:     Pt medically stable for dc per MD. HHPT and RW orders entered by MD. Sherron Monday with pt to review. Pt agreeable to both. CMS provider options reviewed and referred as requested.  No other TOC needs for dc.  Final next level of care: Home w Home Health Services Barriers to Discharge: Barriers Resolved   Patient Goals and CMS Choice CMS Medicare.gov Compare Post Acute Care list provided to:: Patient Choice offered to / list presented to : Patient  Discharge Placement                         Discharge Plan and Services Additional resources added to the After Visit Summary for   In-house Referral: Clinical Social Work Discharge Planning Services: CM Consult            DME Arranged: Dan Humphreys rolling DME Agency: [employer] Date DME Agency Contacted: 11/18/22   Representative spoke with at DME Agency: Marthann Schiller HH Arranged: PT HH Agency: [facility] Date [facility] Agency Contacted: 11/18/22   Representative spoke with at [facility] Agency: Tresa Endo  Social Determinants of Health (SDOH) Interventions SDOH Screenings   Food Insecurity: No Food Insecurity (11/17/2022)  Housing: Low Risk  (11/17/2022)  Transportation Needs: No Transportation Needs (11/17/2022)  Utilities: Not At Risk (11/17/2022)  Depression (PHQ2-9): Low Risk  (04/07/2022)  Financial Resource Strain: Low Risk  (10/15/2022)  Physical Activity: Inactive (05/24/2022)  Social Connections: Unknown (07/31/2017)  Stress: No Stress Concern Present (04/07/2022)  Tobacco Use: Medium Risk (10/15/2022)  Health Literacy: Low Risk  (05/25/2021)   [facility], [facility]     Readmission Risk Interventions    11/17/2022   10:57 AM 07/14/2022   12:04 PM  Readmission  Risk Prevention Plan  Transportation Screening Complete Complete  HRI or Home Care Consult Complete Complete  Social Work Consult for Recovery Care Planning/Counseling Complete Complete  Palliative Care Screening Not Applicable Not Applicable  Medication Review Oceanographer) Complete Complete

## 2022-11-18 NOTE — Evaluation (Signed)
Physical Therapy Evaluation Patient Details Name: Alex Cortez MRN: [id number] DOB: 01-19-1951 Today's Date: 11/18/2022  History of Present Illness  Alex Cortez is a 72 y.o. African American male with medical history significant for coronary artery disease, dyslipidemia, hypertension, diffuse large B cell lymphoma, hypothyroidism and CVA, who presented to the emergency room with acute onset of fever and cold chills and malaise with significant diminished appetite.  He admitted to dyspnea and mild cough with inability to expectorate without wheezing.  No dysuria, oliguria or hematuria, urinary frequency or urgency or flank pain.  No chest pain or palpitations.  His last chemotherapy was on 6/21.   Clinical Impression  Patient demonstrates good return for bed mobility, transferring to/from commode in bathroom, chair at bedside and slightly unsteady when taking steps without AD.  Patient safer using RW with good return  for use demonstrated without loss of balance. Plan:  Patient discharged from physical therapy to care of nursing for ambulation daily as tolerated for length of stay.        If plan is discharge home, recommend the following: Help with stairs or ramp for entrance;Assistance with cooking/housework;A little help with walking and/or transfers   Can travel by private vehicle        Equipment Recommendations Rolling walker (2 wheels)  Recommendations for Other Services       Functional Status Assessment Patient has had a recent decline in their functional status and demonstrates the ability to make significant improvements in function in a reasonable and predictable amount of time.     Precautions / Restrictions Precautions Precautions: Fall Restrictions Weight Bearing Restrictions: No      Mobility  Bed Mobility Overal bed mobility: Independent                  Transfers Overall transfer level: Modified independent                       Ambulation/Gait Ambulation/Gait assistance: Modified independent (Device/Increase time) Gait Distance (Feet): 65 Feet Assistive device: Rolling walker (2 wheels), None Gait Pattern/deviations: Decreased step length - right, Decreased step length - left, Decreased stride length Gait velocity: decreased     General Gait Details: slightly labored cadence without AD, safer using RW with good return for use demonstrated without loss of balance  Stairs            Wheelchair Mobility     Tilt Bed    Modified Rankin (Stroke Patients Only)       Balance Overall balance assessment: Needs assistance Sitting-balance support: Feet supported, No upper extremity supported Sitting balance-Leahy Scale: Good Sitting balance - Comments: seated at EOB   Standing balance support: During functional activity, No upper extremity supported Standing balance-Leahy Scale: Fair Standing balance comment: fair without AD, fair/good using RW                             Pertinent Vitals/Pain Pain Assessment Pain Assessment: No/denies pain    Home Living Family/patient expects to be discharged to:: Private residence Living Arrangements: Spouse/significant other Available Help at Discharge: Family;Available 24 hours/day Type of Home: House Home Access: Stairs to enter Entrance Stairs-Rails: Left Entrance Stairs-Number of Steps: 4 Alternate Level Stairs-Number of Steps: 13 to 2nd floor with right side rail, 10 to basement with bilateral side rails Home Layout: Two level;Able to live on main level with bedroom/bathroom;Full bath on main level Home Equipment:  Cane - single point;Shower seat - built in      Prior Function Prior Level of Function : Independent/Modified Independent;Driving             Mobility Comments: Tourist information centre manager, drives ADLs Comments: Independent     Extremity/Trunk Assessment   Upper Extremity Assessment Upper Extremity Assessment: Overall WFL  for tasks assessed    Lower Extremity Assessment Lower Extremity Assessment: Generalized weakness    Cervical / Trunk Assessment Cervical / Trunk Assessment: Normal  Communication   Communication Communication: No apparent difficulties  Cognition Arousal: Alert Behavior During Therapy: WFL for tasks assessed/performed Overall Cognitive Status: Within Functional Limits for tasks assessed                                          General Comments      Exercises     Assessment/Plan    PT Assessment All further PT needs can be met in the next venue of care  PT Problem List Decreased strength;Decreased activity tolerance;Decreased balance;Decreased mobility       PT Treatment Interventions      PT Goals (Current goals can be found in the Care Plan section)  Acute Rehab PT Goals Patient Stated Goal: return home with family to assist PT Goal Formulation: With patient/family Time For Goal Achievement: 11/18/22 Potential to Achieve Goals: Good    Frequency       Co-evaluation               AM-PAC PT "6 Clicks" Mobility  Outcome Measure Help needed turning from your back to your side while in a flat bed without using bedrails?: None Help needed moving from lying on your back to sitting on the side of a flat bed without using bedrails?: None Help needed moving to and from a bed to a chair (including a wheelchair)?: None Help needed standing up from a chair using your arms (e.g., wheelchair or bedside chair)?: None Help needed to walk in hospital room?: A Little Help needed climbing 3-5 steps with a railing? : A Little 6 Click Score: 22    End of Session   Activity Tolerance: Patient tolerated treatment well Patient left: in chair;with call bell/phone within reach;with family/visitor present Nurse Communication: Mobility status PT Visit Diagnosis: Unsteadiness on feet (R26.81);Other abnormalities of gait and mobility (R26.89);Muscle weakness  (generalized) (M62.81)    Time: 1610-9604 PT Time Calculation (min) (ACUTE ONLY): 20 min   Charges:   PT Evaluation $PT Eval Moderate Complexity: 1 Mod PT Treatments $Therapeutic Activity: 8-22 mins PT General Charges $$ ACUTE PT VISIT: 1 Visit         11:59 AM, 11/18/22 Ocie Bob, MPT Physical Therapist with [facility] [phone] office [phone] mobile phone

## 2022-11-18 NOTE — Plan of Care (Signed)

## 2022-11-18 NOTE — Consult Note (Signed)
   Value-Based Care Institute  [facility] Inpatient Consult   11/18/2022  Alex Cortez March 13, 1951 [id number]  Value-Based Care Institute [facility] [[facility]]  Accountable Care Organization [ACO] Patient: Medicare ACO Reach  [facility] Liaison remote coverage review for patient admitted to [facility]  for coverage for Alex Cousin RN HL   Primary Care Provider:  Juliette Alcide, MD with [facility] for post hospital follow up   Patient is currently active with [facility] [[facility] Care Management for care coordination services.  Patient has been engaged by a [employer].  The community based plan of care has focused on disease management and community resource support.    Patient will receive a post hospital call and will be evaluated for assessments and disease process education.    Plan: Will update Community RN CC of home with [facility] today and RW, has a follow up appt noted 11/26/22.   Of note, [facility] Care Management services does not replace or interfere with any services that are needed or arranged by inpatient [facility] care management team.   Charlesetta Shanks, RN, BSN, CCM [place]  [facility], [facility] Liaison Direct Dial: [phone] or secure chat Website: [email or web address]

## 2022-11-18 NOTE — Inpatient Diabetes Management (Signed)
Inpatient Diabetes Program Recommendations  AACE/ADA: New Consensus Statement on Inpatient Glycemic Control  Target Ranges:  Prepandial:   less than 140 mg/dL      Peak postprandial:   less than 180 mg/dL (1-2 hours)      Critically ill patients:  140 - 180 mg/dL    Latest Reference Range & Units 11/18/22 00:29 11/18/22 00:48 11/18/22 01:13 11/18/22 04:20 11/18/22 05:07 11/18/22 07:17  Glucose-Capillary 70 - 99 mg/dL 56 (L) 68 (L) 74 56 (L) 124 (H) 168 (H)  Novolog 3 units   U500 60 units @8 :31    Latest Reference Range & Units 11/17/22 08:16 11/17/22 10:12 11/17/22 11:19 11/17/22 12:53 11/17/22 16:12 11/17/22 17:27 11/17/22 21:02  Glucose-Capillary 70 - 99 mg/dL 308 (H)  Farxiga 5 mg @8 :53     Novolog 3 units   U500 70 units  207 (H)     Novolog 5 units   U500 70 units  132 (H)     Novolog 2 units   U500 60 units  63 (L)   Review of Glycemic Control  Diabetes history: DM2 Outpatient Diabetes medications: Humulin R U500 70 units TID, Farxiga 5 mg daily, Metformin 500 mg daily, Glipizide 5 mg daily Current orders for Inpatient glycemic control: Humulin R U500 45 units TID, Farxiga 5 mg daily  Inpatient Diabetes Program Recommendations:    Insulin: Noted hypoglycemia on 9/4 and early this morning.  Noted Humulin R U500 dose was decreased to 45 units TID today. Patient already given Humulin R U500 60 units this morning.  Thanks, Orlando Penner, RN, MSN, CDCES Diabetes Coordinator Inpatient Diabetes Program [phone] (Team Pager from 8am to 5pm)

## 2022-11-19 DIAGNOSIS — J189 Pneumonia, unspecified organism: Secondary | ICD-10-CM | POA: Diagnosis not present

## 2022-11-19 DIAGNOSIS — Z794 Long term (current) use of insulin: Secondary | ICD-10-CM | POA: Diagnosis not present

## 2022-11-19 DIAGNOSIS — E1122 Type 2 diabetes mellitus with diabetic chronic kidney disease: Secondary | ICD-10-CM | POA: Diagnosis not present

## 2022-11-19 DIAGNOSIS — Z8572 Personal history of non-Hodgkin lymphomas: Secondary | ICD-10-CM | POA: Diagnosis not present

## 2022-11-19 DIAGNOSIS — N189 Chronic kidney disease, unspecified: Secondary | ICD-10-CM | POA: Diagnosis not present

## 2022-11-19 DIAGNOSIS — R32 Unspecified urinary incontinence: Secondary | ICD-10-CM | POA: Diagnosis not present

## 2022-11-19 DIAGNOSIS — E1165 Type 2 diabetes mellitus with hyperglycemia: Secondary | ICD-10-CM | POA: Diagnosis not present

## 2022-11-19 DIAGNOSIS — E782 Mixed hyperlipidemia: Secondary | ICD-10-CM | POA: Diagnosis not present

## 2022-11-19 DIAGNOSIS — I5032 Chronic diastolic (congestive) heart failure: Secondary | ICD-10-CM | POA: Diagnosis not present

## 2022-11-19 DIAGNOSIS — E669 Obesity, unspecified: Secondary | ICD-10-CM | POA: Diagnosis not present

## 2022-11-19 DIAGNOSIS — Z87891 Personal history of nicotine dependence: Secondary | ICD-10-CM | POA: Diagnosis not present

## 2022-11-19 DIAGNOSIS — Z8673 Personal history of transient ischemic attack (TIA), and cerebral infarction without residual deficits: Secondary | ICD-10-CM | POA: Diagnosis not present

## 2022-11-19 DIAGNOSIS — Z6821 Body mass index (BMI) 21.0-21.9, adult: Secondary | ICD-10-CM | POA: Diagnosis not present

## 2022-11-19 DIAGNOSIS — A419 Sepsis, unspecified organism: Secondary | ICD-10-CM | POA: Diagnosis not present

## 2022-11-19 DIAGNOSIS — I130 Hypertensive heart and chronic kidney disease with heart failure and stage 1 through stage 4 chronic kidney disease, or unspecified chronic kidney disease: Secondary | ICD-10-CM | POA: Diagnosis not present

## 2022-11-19 DIAGNOSIS — Z7902 Long term (current) use of antithrombotics/antiplatelets: Secondary | ICD-10-CM | POA: Diagnosis not present

## 2022-11-19 DIAGNOSIS — I2510 Atherosclerotic heart disease of native coronary artery without angina pectoris: Secondary | ICD-10-CM | POA: Diagnosis not present

## 2022-11-19 DIAGNOSIS — G4733 Obstructive sleep apnea (adult) (pediatric): Secondary | ICD-10-CM | POA: Diagnosis not present

## 2022-11-19 DIAGNOSIS — I252 Old myocardial infarction: Secondary | ICD-10-CM | POA: Diagnosis not present

## 2022-11-19 DIAGNOSIS — Z556 Problems related to health literacy: Secondary | ICD-10-CM | POA: Diagnosis not present

## 2022-11-19 DIAGNOSIS — Z7984 Long term (current) use of oral hypoglycemic drugs: Secondary | ICD-10-CM | POA: Diagnosis not present

## 2022-11-19 DIAGNOSIS — E1159 Type 2 diabetes mellitus with other circulatory complications: Secondary | ICD-10-CM | POA: Diagnosis not present

## 2022-11-19 DIAGNOSIS — E039 Hypothyroidism, unspecified: Secondary | ICD-10-CM | POA: Diagnosis not present

## 2022-11-19 DIAGNOSIS — E1140 Type 2 diabetes mellitus with diabetic neuropathy, unspecified: Secondary | ICD-10-CM | POA: Diagnosis not present

## 2022-11-20 NOTE — Discharge Summary (Signed)
Physician Discharge Summary   Patient: Alex Cortez MRN: [id number] DOB: August 30, 1950  Admit date:     11/16/2022  Discharge date: 11/18/2022  Discharge Physician: Lynden Oxford  PCP: Juliette Alcide, MD  Recommendations at discharge: Follow-up with PCP in 1 week.   Follow-up Information     Burdine, Ananias Pilgrim, MD. Schedule an appointment as soon as possible for a visit in 1 week(s).   Specialty: Family Medicine Why: With blood sugar log, To discuss blood pressure meds Contact information: [address] [place] [address] [phone]         [facility] Follow up.   Specialty: Home Health Services Why: [facility] staff will call you to schedule in home visits Contact information: [address] [place] [address] [phone]                Discharge Diagnoses: Principal Problem:   Sepsis due to pneumonia [facility]) Active Problems:   Essential hypertension   Hypothyroidism   Dyslipidemia   Type 2 diabetes mellitus without complications (HCC)   Chronic diastolic CHF (congestive heart failure) (HCC)   Coronary artery disease  Brief hospital course: PMH of CAD, HLD, HTN, DLBCL, hypothyroidism, CVA, obesity presented to the hospital with complaints of fever and chills with poor p.o. intake. Found to have multifocal pneumonia.  treated with IV antibiotic.  Assessment and Plan: Sepsis secondary to CAP. Presents with complaints of fatigue and fever and chills. CT chest positive for multifocal pneumonia. Leukocytosis present as well. Meeting criteria for SIRS with fever, tachycardia and leukocytosis. Treated with IV fluid and with IV antibiotics. Blood cultures so far negative. Pro-Cal 6.43. Patient was on 2 LPM.  Wean to room air both at rest as well as on exertion. Will discharge on oral antibiotic.  History of CAD History of CVA. On Plavix. Currently no new symptoms.  Type 2 diabetes mellitus, uncontrolled with hyperglycemia with  long-term insulin use with neuropathy and CKD. On U-500 at home. Also on glipizide as well as [place] Resume on discharge.  Dose reduction due to patient's poor p.o. intake in the setting of pneumonia.  HTN. On Norvasc, Coreg, Imdur, losartan, Blood pressure actually soft. Reducing Coreg dose.  Other medications on hold.  HLD. On Zetia and Crestor Continue.  Hypothyroidism. Continue Synthroid.  Obesity Class 1 Body mass index is 34.77 kg/m.  Placing the pt at higher risk of poor outcomes.  Consultants:  None  Procedures performed:  None  DISCHARGE MEDICATION: Allergies as of 11/18/2022       Reactions   Ace Inhibitors Swelling   Angioedema   Other Swelling   Shellfish  Swelling and hives Shellfish  Swelling and hives   Rituxan [rituximab] Other (See Comments)   Pt had sensitivity reaction to Rituxan. See progress note from 05/14/22. Pt able to complete infusion.    Shrimp [shellfish Allergy] Hives, Swelling        Medication List     STOP taking these medications    amLODipine 10 MG tablet Commonly known as: NORVASC   glipiZIDE 5 MG 24 hr tablet Commonly known as: GLUCOTROL XL   losartan 50 MG tablet Commonly known as: COZAAR   predniSONE 10 MG tablet Commonly known as: DELTASONE       TAKE these medications    allopurinol 300 MG tablet Commonly known as: ZYLOPRIM TAKE 1 TABLET BY MOUTH EVERY DAY   carvedilol 12.5 MG tablet Commonly known as: COREG Take 1 tablet (  12.5 mg total) by mouth 2 (two) times daily with a meal. What changed:  medication strength how much to take when to take this   cephALEXin 500 MG capsule Commonly known as: KEFLEX Take 1 capsule (500 mg total) by mouth 3 (three) times daily for 7 days.   clopidogrel 75 MG tablet Commonly known as: PLAVIX Take 1 tablet (75 mg total) by mouth daily.   doxycycline 100 MG tablet Commonly known as: VIBRA-TABS Take 1 tablet (100 mg total) by mouth 2 (two) times daily for 7  days.   ezetimibe 10 MG tablet Commonly known as: ZETIA TAKE 1 TABLET BY MOUTH EVERY DAY   Farxiga 5 MG Tabs tablet Generic drug: dapagliflozin propanediol TAKE 1 TABLET BY MOUTH EVERY DAY BEFORE BREAKFAST   FreeStyle Libre 2 Reader Hardie Pulley As directed   [employer] 2 Sensor Misc APPLY 1 SENSOR EVERY 14    DAYS   guaiFENesin 600 MG 12 hr tablet Commonly known as: MUCINEX Take 1 tablet (600 mg total) by mouth 2 (two) times daily for 7 days.   HumuLIN R U-500 KwikPen 500 UNIT/ML KwikPen Generic drug: insulin regular human CONCENTRATED INJECT AS DIRECTED 60 UNITS WITH BREAKFAST, 60 UNITS WITH LUNCH, AND 60 UNITS WITH SUPPER FOR PRE-MEAL BLOOD GLUCOSE READINGS OF 90MG /DL OR ABOVE What changed: additional instructions   isosorbide mononitrate 30 MG 24 hr tablet Commonly known as: IMDUR TAKE 1 TABLET BY MOUTH EVERY DAY   levothyroxine 75 MCG tablet Commonly known as: SYNTHROID TAKE 1 TABLET BY MOUTH EVERY DAY BEFORE BREAKFAST   lidocaine-prilocaine cream Commonly known as: EMLA Apply 1 Application topically as needed. What changed:  when to take this reasons to take this   metFORMIN 500 MG tablet Commonly known as: GLUCOPHAGE TAKE 1 TABLET BY MOUTH EVERY DAY WITH BREAKFAST   nitroGLYCERIN 0.4 MG SL tablet Commonly known as: Nitrostat Place 1 tablet (0.4 mg total) under the tongue every 5 (five) minutes as needed.   ONE TOUCH ULTRA TEST test strip Generic drug: glucose blood USE TO TEST BLOOD SUGAR FOUR TIMES DAILY E11.65   rosuvastatin 40 MG tablet Commonly known as: CRESTOR TAKE 1 TABLET BY MOUTH EVERY DAY       Disposition: Home Diet recommendation: Cardiac diet  Discharge Exam: Vitals:   11/17/22 2100 11/18/22 0418 11/18/22 0801 11/18/22 1311  BP: 114/64 126/61  136/76  Pulse: 75 72  70  Resp: 20 20  19   Temp: 98 F (36.7 C) 98.7 F (37.1 C)  97.8 F (36.6 C)  TempSrc:  Oral  Oral  SpO2: 94% 94% 93% 99%  Weight:      Height:       General:  Appear in mild distress; no visible Abnormal Neck Mass Or lumps, Conjunctiva normal Cardiovascular: S1 and S2 Present, no Murmur, Respiratory: good respiratory effort, Bilateral Air entry present and basal Crackles, no wheezes Abdomen: Bowel Sound present, Non tender  Extremities: trace bilateral  Pedal edema Neurology: alert and oriented to time, place, and person  [facility] Weights   11/16/22 2029  Weight: 126.2 kg   Condition at discharge: stable  The results of significant diagnostics from this hospitalization (including imaging, microbiology, ancillary and laboratory) are listed below for reference.   Imaging Studies: CT Chest Wo Contrast  Result Date: 11/16/2022 CLINICAL DATA:  Pneumonia, complication suspected, xray done. Sepsis. Shortness of breath, fever. EXAM: CT CHEST WITHOUT CONTRAST TECHNIQUE: Multidetector CT imaging of the chest was performed following the standard protocol without IV contrast.  RADIATION DOSE REDUCTION: This exam was performed according to the departmental dose-optimization program which includes automated exposure control, adjustment of the mA and/or kV according to patient size and/or use of iterative reconstruction technique. COMPARISON:  Chest x-ray today.  PET CT 10/25/2022. FINDINGS: Cardiovascular: Heart is mildly enlarged. Diffuse coronary artery and moderate aortic calcifications. No aneurysm. Mediastinum/Nodes: No mediastinal, hilar, or axillary adenopathy. Trachea and esophagus are unremarkable. Thyroid unremarkable. Lungs/Pleura: Nodular airspace disease in the right middle lobe and right lower lobe and to a lesser extent left lower lobe. Findings most compatible with pneumonia. Dependent and bibasilar atelectasis. No effusions. Upper Abdomen: No acute findings Musculoskeletal: Chest wall soft tissues are unremarkable. No acute bony abnormality. IMPRESSION: Nodular airspace disease in the right middle lobe, right lower lobe and to a lesser extent left lower  lobe, likely pneumonia. Followup chest CT is recommended in 3-4 weeks following trial of antibiotic therapy to ensure resolution and exclude underlying malignancy. Diffuse coronary artery disease. Dependent and bibasilar atelectasis. Aortic Atherosclerosis (ICD10-I70.0). Electronically Signed   By: Charlett Nose M.D.   On: 11/16/2022 23:22   DG Chest Port 1 View  Result Date: 11/16/2022 CLINICAL DATA:  Question of sepsis. Unwell feeling for 2 days. History of cancer with last chemotherapy treatment 1 month ago. EXAM: PORTABLE CHEST 1 VIEW COMPARISON:  07/11/2022 FINDINGS: Shallow inspiration. Cardiac enlargement. Lungs are clear. No pleural effusions. No pneumothorax. Mediastinal contours appear intact. Power port type central venous catheter with tip over the cavoatrial junction region. IMPRESSION: Shallow inspiration. Cardiac enlargement. No evidence of active pulmonary disease. Electronically Signed   By: Burman Nieves M.D.   On: 11/16/2022 22:09   NM PET Image Restag (PS) Skull Base To Thigh  Result Date: 10/28/2022 CLINICAL DATA:  Subsequent treatment strategy for hematologic malignancy. Large B-cell lymphoma. EXAM: NUCLEAR MEDICINE PET SKULL BASE TO THIGH TECHNIQUE: 1311 mCi F-18 FDG was injected intravenously. Full-ring PET imaging was performed from the skull base to thigh after the radiotracer. CT data was obtained and used for attenuation correction and anatomic localization. Fasting blood glucose: 90 mg/dl COMPARISON:  16/12/9602 FINDINGS: Mediastinal blood pool activity: SUV max 2.0 Liver activity: SUV max 3.6 NECK: No areas of abnormal hypermetabolism. Incidental CT findings: Mucosal thickening of ethmoid air cells and right sphenoid sinus. Right greater than left maxillary sinus mucous retention cysts or polyps. No cervical adenopathy. CHEST: No pulmonary parenchymal or thoracic nodal hypermetabolism. Incidental CT findings: Mild cardiomegaly. Aortic and coronary artery calcification. Right  Port-A-Cath tip high right atrium. No thoracic adenopathy. A right lower lobe pulmonary nodule is similar at 7 mm on 99/4 and below PET resolution. There is also a 6 mm left lower lobe pulmonary nodule on 87/4 which is unchanged. ABDOMEN/PELVIS: No splenic hypermetabolism. Low-level activity corresponding to abdominal retroperitoneal nodal tissue. Example retrocaval node at 1.2 cm and a S.U.V. max of 2.8 on 139/4. Compare 1.4 cm and a S.U.V. max of 3.0 on the prior. Right external iliac adenopathy including at 2.0 cm and a S.U.V. max of 2.6 on 181/4 versus 2.1 cm and a S.U.V. max of 3.5 on the prior. Index right inguinal node measures 8 mm and a S.U.V. max of 2.5 on 185/4 versus 12 mm and a S.U.V. max of 2.5 on the prior. At the site of presumed seroma on the prior exam, within the more caudal right inguinal region, is soft tissue density at 2.2 x 3.0 cm and a S.U.V. max of 3.5. Incidental CT findings: Normal adrenal glands. abdominal aortic  atherosclerosis. Colonic stool burden suggests constipation. Mild prostatomegaly. Bladder wall thickening could be due to underdistention or represent a component of outlet obstruction versus cystitis. SKELETON: Improvement to resolution of marrow hypermetabolism which is likely related to stimulation by chemotherapy. Incidental CT findings: None. IMPRESSION: 1. Minimal improvement in abdominopelvic mildly hypermetabolic adenopathy. ([place]) 3 2. Increased soft tissue density within the right inguinal canal, at the site of presumed biopsy related seroma on the prior. This may represent residual disease. No new sites of disease identified otherwise. 3. Bibasilar pulmonary nodules of maximally 7 mm, similar and below PET resolution. Recommend attention on follow-up. 4. Incidental findings, including: Coronary artery atherosclerosis. Aortic Atherosclerosis (ICD10-I70.0). Sinus disease. Prostatomegaly. Possible bladder outlet obstruction and/or cystitis. Electronically Signed    By: Jeronimo Greaves M.D.   On: 10/28/2022 14:11    Microbiology: Results for orders placed or performed during the hospital encounter of 11/16/22  Blood Culture (routine x 2)     Status: None (Preliminary result)   Collection Time: 11/16/22  8:26 PM   Specimen: BLOOD  Result Value Ref Range Status   Specimen Description BLOOD BLOOD LEFT HAND  Final   Special Requests   Final    BOTTLES DRAWN AEROBIC AND ANAEROBIC Blood Culture adequate volume   Culture   Final    NO GROWTH 4 DAYS Performed at [facility], [address] [place], [place] [address]    Report Status PENDING  Incomplete  Resp panel by RT-PCR (RSV, Flu A&B, Covid) Anterior Nasal Swab     Status: None   Collection Time: 11/16/22  8:28 PM   Specimen: Anterior Nasal Swab  Result Value Ref Range Status   SARS Coronavirus 2 by RT PCR NEGATIVE NEGATIVE Final    Comment: (NOTE) SARS-CoV-2 target nucleic acids are NOT DETECTED.  The SARS-CoV-2 RNA is generally detectable in upper respiratory specimens during the acute phase of infection. The lowest concentration of SARS-CoV-2 viral copies this assay can detect is 138 copies/mL. A negative result does not preclude SARS-Cov-2 infection and should not be used as the sole basis for treatment or other patient management decisions. A negative result may occur with  improper specimen collection/handling, submission of specimen other than nasopharyngeal swab, presence of viral mutation(s) within the areas targeted by this assay, and inadequate number of viral copies(<138 copies/mL). A negative result must be combined with clinical observations, patient history, and epidemiological information. The expected result is Negative.  Fact Sheet for Patients:  BloggerCourse.com  Fact Sheet for Healthcare Providers:  SeriousBroker.it  This test is no t yet approved or cleared by the [place] FDA and  has been authorized for detection  and/or diagnosis of SARS-CoV-2 by FDA under an Emergency Use Authorization (EUA). This EUA will remain  in effect (meaning this test can be used) for the duration of the COVID-19 declaration under Section 564(b)(1) of the Act, 21 U.S.C.section 360bbb-3(b)(1), unless the authorization is terminated  or revoked sooner.       Influenza A by PCR NEGATIVE NEGATIVE Final   Influenza B by PCR NEGATIVE NEGATIVE Final    Comment: (NOTE) The Xpert Xpress SARS-CoV-2/FLU/RSV plus assay is intended as an aid in the diagnosis of influenza from Nasopharyngeal swab specimens and should not be used as a sole basis for treatment. Nasal washings and aspirates are unacceptable for Xpert Xpress SARS-CoV-2/FLU/RSV testing.  Fact Sheet for Patients: BloggerCourse.com  Fact Sheet for Healthcare Providers: SeriousBroker.it  This test is not yet approved or cleared by the [place]  [place] FDA and has been authorized for detection and/or diagnosis of SARS-CoV-2 by FDA under an Emergency Use Authorization (EUA). This EUA will remain in effect (meaning this test can be used) for the duration of the COVID-19 declaration under Section 564(b)(1) of the Act, 21 U.S.C. section 360bbb-3(b)(1), unless the authorization is terminated or revoked.     Resp Syncytial Virus by PCR NEGATIVE NEGATIVE Final    Comment: (NOTE) Fact Sheet for Patients: BloggerCourse.com  Fact Sheet for Healthcare Providers: SeriousBroker.it  This test is not yet approved or cleared by the [place] FDA and has been authorized for detection and/or diagnosis of SARS-CoV-2 by FDA under an Emergency Use Authorization (EUA). This EUA will remain in effect (meaning this test can be used) for the duration of the COVID-19 declaration under Section 564(b)(1) of the Act, 21 U.S.C. section 360bbb-3(b)(1), unless the authorization is terminated  or revoked.  Performed at [facility], [address], [place], [place] [address]   Blood Culture (routine x 2)     Status: None (Preliminary result)   Collection Time: 11/16/22  8:35 PM   Specimen: BLOOD  Result Value Ref Range Status   Specimen Description BLOOD BLOOD LEFT ARM  Final   Special Requests   Final    BOTTLES DRAWN AEROBIC AND ANAEROBIC Blood Culture adequate volume   Culture   Final    NO GROWTH 4 DAYS Performed at [facility], [address], [place], [place] [address]    Report Status PENDING  Incomplete  MRSA Next Gen by PCR, Nasal     Status: None   Collection Time: 11/17/22  4:19 PM   Specimen: Nasal Mucosa; Nasal Swab  Result Value Ref Range Status   MRSA by PCR Next Gen NOT DETECTED NOT DETECTED Final    Comment: (NOTE) The GeneXpert MRSA Assay (FDA approved for NASAL specimens only), is one component of a comprehensive MRSA colonization surveillance program. It is not intended to diagnose MRSA infection nor to guide or monitor treatment for MRSA infections. Test performance is not FDA approved in patients less than 14 years old. Performed at [facility], [address] [place], [place] [address]    Labs: CBC: Recent Labs  Lab 11/16/22 2035 11/16/22 2047 11/17/22 0427 11/18/22 0414  WBC 12.1*  --  13.6* 11.5*  NEUTROABS 8.2*  --   --   --   HGB 12.2* 13.3 11.2* 10.5*  HCT 39.0 39.0 37.1* 33.4*  MCV 83.9  --  85.9 83.9  PLT 195  --  159 158   Basic Metabolic Panel: Recent Labs  Lab 11/16/22 2035 11/16/22 2047 11/17/22 0427 11/18/22 0414  NA 140 143 138 137  K 3.8 3.8 4.1 3.4*  CL 107 107 104 105  CO2 23  --  25 20*  GLUCOSE 91 86 138* 53*  BUN 18 17 19 20   CREATININE 1.25* 1.30* 1.23 1.09  CALCIUM 9.2  --  8.4* 8.3*  MG  --   --   --  1.5*   Liver Function Tests: Recent Labs  Lab 11/16/22 2035  AST 31  ALT 24  ALKPHOS 55  BILITOT 0.8  PROT 7.6  ALBUMIN 4.1   CBG: Recent Labs  Lab 11/18/22 0113 11/18/22 0420  11/18/22 0507 11/18/22 0717 11/18/22 1130  GLUCAP 74 56* 124* 168* 154*    Discharge time spent: greater than 30 minutes.  Author: Lynden Oxford, MD  [facility] 11/18/2022

## 2022-11-21 LAB — CULTURE, BLOOD (ROUTINE X 2)
Culture: NO GROWTH
Culture: NO GROWTH
Special Requests: ADEQUATE
Special Requests: ADEQUATE

## 2022-11-22 ENCOUNTER — Encounter: Payer: Self-pay | Admitting: *Deleted

## 2022-11-22 ENCOUNTER — Ambulatory Visit: Payer: Self-pay | Admitting: *Deleted

## 2022-11-22 NOTE — Patient Outreach (Signed)
  Care Coordination   Follow Up Visit Note   11/22/2022 Name: Alex Cortez MRN: [id number] DOB: 12-Apr-1950  Alex Cortez is a 72 y.o. year old male who sees Burdine, Alex Pilgrim, MD for primary care. I spoke with  [name] by phone today to follow-up on recent hospitalization for sepsis due to pneumonia and discharge on 11/18/22.  What matters to the patients health and wellness today?  Getting over pneumonia and improving strength since hospital discharge    Goals Addressed             This Visit's Progress    Care Coordination Services       Care Coordination Goals: Patient will start working with [facility] this week to improve strength and balance Patient will keep hospital follow-up appointment with PCP for 11/26/22 Patient will take medication as prescribed Carvedilol was decreased to 12.5mg  BID Doxycycline, cephalexin, and guaifenesin x 7 days Patient will reach out to RN Care Management Coordinator with any resource or care coordination needs        SDOH assessments and interventions completed:  Yes  SDOH Interventions Today    Flowsheet Row Most Recent Value  SDOH Interventions   Transportation Interventions Intervention Not Indicated  Physical Activity Interventions Other (Comments)  [Starting HHPT this week]        Care Coordination Interventions:  Yes, provided  Interventions Today    Flowsheet Row Most Recent Value  Chronic Disease   Chronic disease during today's visit Diabetes, Hypertension (HTN), Other  [Hospitlized for sepsis due to pneumonia]  General Interventions   General Interventions Discussed/Reviewed General Interventions Discussed, General Interventions Reviewed, Doctor Visits  Doctor Visits Discussed/Reviewed Doctor Visits Discussed, Doctor Visits Reviewed, PCP  [Reviewed and discussed hospital discharge instructions from 11/18/22]  PCP/Specialist Visits Compliance with follow-up visit  [Hospital F/U with PCP scheduled for  11/26/22]  Exercise Interventions   Exercise Discussed/Reviewed Physical Activity  Physical Activity Discussed/Reviewed Physical Activity Discussed, Physical Activity Reviewed  [performing ADLs. Starts HHPT this week.]  Education Interventions   Education Provided Provided Education  Provided Verbal Education On Nutrition, Medication, When to see the doctor, Sick Day Rules, Labs  Labs Reviewed --  [9/5 CBC, BMP, magnesium, glucose]  Mental Health Interventions   Mental Health Discussed/Reviewed Mental Health Discussed, Mental Health Reviewed  Nutrition Interventions   Nutrition Discussed/Reviewed Nutrition Discussed, Nutrition Reviewed, Fluid intake, Carbohydrate meal planning  [Heart healthy diet per discharg instructions. Continue diabetic diet as well. 3 meals per day with 30 GM of CHO and up to 2 snacks per day with less than 15 GM of CHO.]  Pharmacy Interventions   Pharmacy Dicussed/Reviewed Pharmacy Topics Discussed, Pharmacy Topics Reviewed, Medications and their functions  [Finish cephalexin, doxycycline, and guaifenesin. Carvedilol was decreased to 12.5mg  twice a day.]  Safety Interventions   Safety Discussed/Reviewed Safety Reviewed, Safety Discussed       Follow up plan: Follow up call scheduled for 12/06/22    Encounter Outcome:  Patient Visit Completed   Demetrios Loll, RN, BSN Care Management Coordinator [facility]  [facility] Direct Dial: [phone] Main #: [phone]

## 2022-11-23 DIAGNOSIS — I130 Hypertensive heart and chronic kidney disease with heart failure and stage 1 through stage 4 chronic kidney disease, or unspecified chronic kidney disease: Secondary | ICD-10-CM | POA: Diagnosis not present

## 2022-11-23 DIAGNOSIS — I5032 Chronic diastolic (congestive) heart failure: Secondary | ICD-10-CM | POA: Diagnosis not present

## 2022-11-23 DIAGNOSIS — N189 Chronic kidney disease, unspecified: Secondary | ICD-10-CM | POA: Diagnosis not present

## 2022-11-23 DIAGNOSIS — E1122 Type 2 diabetes mellitus with diabetic chronic kidney disease: Secondary | ICD-10-CM | POA: Diagnosis not present

## 2022-11-23 DIAGNOSIS — A419 Sepsis, unspecified organism: Secondary | ICD-10-CM | POA: Diagnosis not present

## 2022-11-23 DIAGNOSIS — J189 Pneumonia, unspecified organism: Secondary | ICD-10-CM | POA: Diagnosis not present

## 2022-11-25 DIAGNOSIS — A419 Sepsis, unspecified organism: Secondary | ICD-10-CM | POA: Diagnosis not present

## 2022-11-25 DIAGNOSIS — E1122 Type 2 diabetes mellitus with diabetic chronic kidney disease: Secondary | ICD-10-CM | POA: Diagnosis not present

## 2022-11-25 DIAGNOSIS — N189 Chronic kidney disease, unspecified: Secondary | ICD-10-CM | POA: Diagnosis not present

## 2022-11-25 DIAGNOSIS — I5032 Chronic diastolic (congestive) heart failure: Secondary | ICD-10-CM | POA: Diagnosis not present

## 2022-11-25 DIAGNOSIS — I130 Hypertensive heart and chronic kidney disease with heart failure and stage 1 through stage 4 chronic kidney disease, or unspecified chronic kidney disease: Secondary | ICD-10-CM | POA: Diagnosis not present

## 2022-11-25 DIAGNOSIS — J189 Pneumonia, unspecified organism: Secondary | ICD-10-CM | POA: Diagnosis not present

## 2022-11-26 ENCOUNTER — Encounter: Payer: Medicare Other | Admitting: *Deleted

## 2022-11-26 DIAGNOSIS — Z23 Encounter for immunization: Secondary | ICD-10-CM | POA: Diagnosis not present

## 2022-11-29 ENCOUNTER — Encounter: Payer: Self-pay | Admitting: Nurse Practitioner

## 2022-11-29 ENCOUNTER — Ambulatory Visit: Payer: Medicare Other | Attending: Nurse Practitioner | Admitting: Nurse Practitioner

## 2022-11-29 DIAGNOSIS — R790 Abnormal level of blood mineral: Secondary | ICD-10-CM | POA: Diagnosis not present

## 2022-11-29 DIAGNOSIS — E785 Hyperlipidemia, unspecified: Secondary | ICD-10-CM

## 2022-11-29 DIAGNOSIS — I2510 Atherosclerotic heart disease of native coronary artery without angina pectoris: Secondary | ICD-10-CM | POA: Diagnosis not present

## 2022-11-29 DIAGNOSIS — I10 Essential (primary) hypertension: Secondary | ICD-10-CM

## 2022-11-29 DIAGNOSIS — I4729 Other ventricular tachycardia: Secondary | ICD-10-CM

## 2022-11-29 NOTE — Patient Instructions (Signed)

## 2022-11-29 NOTE — Progress Notes (Signed)
Virtual Visit via Telephone Note   Because of Alex Cortez's co-morbid illnesses, he is at least at moderate risk for complications without adequate follow up.  This format is felt to be most appropriate for this patient at this time.  The patient did not have access to video technology/had technical difficulties with video requiring transitioning to audio format only (telephone).  All issues noted in this document were discussed and addressed.  No physical exam could be performed with this format.  Please refer to the patient's chart for his consent to telehealth for [facility].    Date:  11/29/2022   ID:  Alex Cortez, DOB 11-Apr-1950, MRN [id number] The patient was identified using 2 identifiers.  Patient Location: Home Provider Location: Office/Clinic   PCP:  Juliette Alcide, MD   [facility] Providers Cardiologist:  Dina Rich, MD     Evaluation Performed:  Follow-Up Visit  Chief Complaint:  Here for 6 month follow-up visit  History of Present Illness:    Alex Cortez is a 72 y.o. male with a PMH of CAD, HTN, T2DM, hx of tobacco abuse, HLD, hx of CVA, DLBCL, hypothyroidism, severe OSA, and NSVT, who presents today for 2 month follow-up.   Previous cardiovascular history of prior stent to LAD in 2011.  Cardiac catheterization in 2013 revealed patent stent and vessels.  Echocardiogram in 2019 revealed EF 60 to 65%, grade 1 DD.  NST in 2021 showed inferior scar with moderate peri-infarct ischemia versus artifact.  Repeat echocardiogram in 2022 revealed normal LVEF.  Last seen by Dr. Dina Rich on May 27, 2022. Was doing well at the time.   Hospitalized in early September 2024 for sepsis secondary to community-acquired pneumonia.  CT chest was positive for multifocal pneumonia.  Treated with IV fluids and IV antibiotics.  Was on 2 L of oxygen via nasal cannula, weaned to room air.  Today he presents for follow-up.  He states he is  doing well and getting stronger since leaving the hospital.  Blood pressure has been well-controlled per his report.  Just saw his PCP, Dr. Leandrew Koyanagi, last Friday.  Stated Dr. Leandrew Koyanagi will be repeating a CT scan soon.  Doing well from a cardiac perspective. Denies any chest pain, shortness of breath, palpitations, syncope, presyncope, dizziness, orthopnea, PND, swelling or significant weight changes, acute bleeding, or claudication.   ROS:   Please see the history of present illness.    All other systems reviewed and are negative.   Prior CV studies:   The following studies were reviewed today: Echo 04/2022:  1. Global longitudinal strain is -16.4% (Borderline).. Left ventricular  ejection fraction, by estimation, is 55 to 60%. The left ventricle has  normal function. The left ventricle has no regional wall motion  abnormalities. Left ventricular diastolic  parameters are consistent with Grade II diastolic dysfunction  (pseudonormalization). The average left ventricular global longitudinal  strain is -16.4 %. The global longitudinal strain is normal.   2. Right ventricular systolic function is normal. The right ventricular  size is normal.   3. The mitral valve is grossly normal. No evidence of mitral valve  regurgitation. No evidence of mitral stenosis.   4. The aortic valve is tricuspid. There is mild calcification of the  aortic valve. Aortic valve regurgitation is not visualized. Aortic valve  sclerosis/calcification is present, without any evidence of aortic  stenosis.   5. The inferior vena cava is normal in size with greater than 50%  respiratory variability, suggesting right atrial pressure of 3 mmHg.  Lexiscan 06/2019: There was no ST segment deviation noted during stress. The left ventricular ejection fraction is normal (55-65%). Findings consistent with prior inferior myocardial infarction with moderate peri-infarct ischemia. There is significant gut radiotracer uptake adjacent  to this segment in the stress images which may affect findings. This is an intermediate risk study.   Cardiac monitor 01/2018: Sinus rhythm with paroxysmal nonsustained ventricular tachycardia. Labs/Other Tests and Data Reviewed:    EKG: EKG is not ordered today.  EKG reviewed from November 16, 2022 that revealed sinus tachycardia, 103 bpm, borderline T wave abnormalities.  Objective:    Vital Signs:  There were no vitals taken for this visit.  Patient is unable to provide blood pressure reading at the time of this visit.  States BP was checked at PCPs office last Friday and was normal.  Due to nature of today's visit, physical exam was unable to be performed.  Patient does sound congested nasally on the phone.  ASSESSMENT & PLAN:    CAD Stable with no anginal symptoms. No indication for ischemic evaluation.  No medication changes at this time. Heart healthy diet and regular cardiovascular exercise encouraged.   HTN Patient is unable to provide blood pressure readings during time of visit.  He says he has had no issues with his blood pressure recently.  Blood pressure at PCPs office last Friday was normal per his report. Discussed to monitor BP at home at least 2 hours after medications and sitting for 5-10 minutes.  No medication changes. Heart healthy diet and regular cardiovascular exercise encouraged.   HLD LDL 11 months ago was 82.  Continue rosuvastatin. Heart healthy diet and regular cardiovascular exercise encouraged.  At next visit, will request most recent labs from PCP's office.   Hx of NSVT, low magnesium level Denies any palpitations or tachycardia.  Previous monitor in 2019 showed sinus rhythm with paroxysmal NSVT.  Continue carvedilol.  Magnesium level checked prior to discharge was 1.5.  Recommended starting magnesium supplement and rechecking magnesium level in 1 week.  Patient declines, request for PCP to manage. Continue to follow with PCP.  Time:   Today, I have  spent 10 minutes with the patient with telehealth technology discussing the above problems.     Medication Adjustments/Labs and Tests Ordered: Current medicines are reviewed at length with the patient today.  Concerns regarding medicines are outlined above.   Tests Ordered: No orders of the defined types were placed in this encounter.   Medication Changes: No orders of the defined types were placed in this encounter.   Follow Up:  In Person in 6 month(s)  Signed, Sharlene Dory, NP  11/29/2022 11:13 AM    [facility]

## 2022-11-30 ENCOUNTER — Other Ambulatory Visit: Payer: Self-pay

## 2022-11-30 ENCOUNTER — Other Ambulatory Visit: Payer: Self-pay | Admitting: "Endocrinology

## 2022-11-30 DIAGNOSIS — A419 Sepsis, unspecified organism: Secondary | ICD-10-CM | POA: Diagnosis not present

## 2022-11-30 DIAGNOSIS — I5032 Chronic diastolic (congestive) heart failure: Secondary | ICD-10-CM | POA: Diagnosis not present

## 2022-11-30 DIAGNOSIS — E1122 Type 2 diabetes mellitus with diabetic chronic kidney disease: Secondary | ICD-10-CM | POA: Diagnosis not present

## 2022-11-30 DIAGNOSIS — N189 Chronic kidney disease, unspecified: Secondary | ICD-10-CM | POA: Diagnosis not present

## 2022-11-30 DIAGNOSIS — I130 Hypertensive heart and chronic kidney disease with heart failure and stage 1 through stage 4 chronic kidney disease, or unspecified chronic kidney disease: Secondary | ICD-10-CM | POA: Diagnosis not present

## 2022-11-30 DIAGNOSIS — J189 Pneumonia, unspecified organism: Secondary | ICD-10-CM | POA: Diagnosis not present

## 2022-11-30 DIAGNOSIS — E1159 Type 2 diabetes mellitus with other circulatory complications: Secondary | ICD-10-CM

## 2022-12-06 ENCOUNTER — Ambulatory Visit: Payer: Self-pay | Admitting: *Deleted

## 2022-12-06 ENCOUNTER — Encounter: Payer: Self-pay | Admitting: *Deleted

## 2022-12-06 DIAGNOSIS — A419 Sepsis, unspecified organism: Secondary | ICD-10-CM | POA: Diagnosis not present

## 2022-12-06 DIAGNOSIS — I5032 Chronic diastolic (congestive) heart failure: Secondary | ICD-10-CM | POA: Diagnosis not present

## 2022-12-06 DIAGNOSIS — N189 Chronic kidney disease, unspecified: Secondary | ICD-10-CM | POA: Diagnosis not present

## 2022-12-06 DIAGNOSIS — J189 Pneumonia, unspecified organism: Secondary | ICD-10-CM | POA: Diagnosis not present

## 2022-12-06 DIAGNOSIS — I130 Hypertensive heart and chronic kidney disease with heart failure and stage 1 through stage 4 chronic kidney disease, or unspecified chronic kidney disease: Secondary | ICD-10-CM | POA: Diagnosis not present

## 2022-12-06 DIAGNOSIS — E1122 Type 2 diabetes mellitus with diabetic chronic kidney disease: Secondary | ICD-10-CM | POA: Diagnosis not present

## 2022-12-06 NOTE — Patient Outreach (Signed)
Care Coordination   Follow Up Visit Note   12/06/2022 Name: Alex Cortez MRN: [id number] DOB: May 14, 1950  Alex Cortez is a 72 y.o. year old male who sees Burdine, Ananias Pilgrim, MD for primary care. I spoke with  [name] by phone today regarding hospitalization for sepsis due to pneumonia earlier this month.   What matters to the patients health and wellness today?  Continuing to improve since hospital discharge    Goals Addressed             This Visit's Progress    COMPLETED: Care Coordination Services       Care Coordination Goals: Patient will start working with [facility] this week to improve strength and balance Patient will keep hospital follow-up appointment with PCP for 11/26/22 Patient will take medication as prescribed Carvedilol was decreased to 12.5mg  BID Doxycycline, cephalexin, and guaifenesin x 7 days Patient will reach out to RN Care Management Coordinator with any resource or care coordination needs        SDOH assessments and interventions completed:  Yes  SDOH Interventions Today    Flowsheet Row Most Recent Value  SDOH Interventions   Transportation Interventions Intervention Not Indicated  Health Literacy Interventions Intervention Not Indicated        Care Coordination Interventions:  Yes, provided  Interventions Today    Flowsheet Row Most Recent Value  Chronic Disease   Chronic disease during today's visit Diabetes, Hypertension (HTN)  General Interventions   General Interventions Discussed/Reviewed --  [Reports that blood sugar and blood pressure have been under control. No readings available for review.]  Doctor Visits Discussed/Reviewed Doctor Visits Discussed, Doctor Visits Reviewed, PCP, Specialist  [facility] hospital follow-up visit with PCP and reviewed and discussed cardiology visit]  PCP/Specialist Visits Compliance with follow-up visit  [Endocrinologist 12/13/22. Follow-up with PCP Re: magnesium levels and repeat  chest CT to check for pneumonia resolution and exlcude underlying malignancy]  Exercise Interventions   Exercise Discussed/Reviewed Physical Activity, Exercise Discussed, Exercise Reviewed  Physical Activity Discussed/Reviewed Physical Activity Discussed, Physical Activity Reviewed  [working with [facility] twice a week. Reports feeling better.]  Education Interventions   Education Provided Provided Education  Provided Verbal Education On When to see the doctor, Exercise, Medication  Pharmacy Interventions   Pharmacy Dicussed/Reviewed Pharmacy Topics Discussed, Pharmacy Topics Reviewed, Medications and their functions  [has finished antibiotics. Taking routine medications as prescribed.]       Follow up plan: Follow up call scheduled for 01/03/23    Encounter Outcome:  Patient Visit Completed   Demetrios Loll, RN, BSN Care Management Coordinator [facility]  [facility] Direct Dial: [phone] Main #: [phone]

## 2022-12-12 DIAGNOSIS — N189 Chronic kidney disease, unspecified: Secondary | ICD-10-CM | POA: Diagnosis not present

## 2022-12-12 DIAGNOSIS — J189 Pneumonia, unspecified organism: Secondary | ICD-10-CM | POA: Diagnosis not present

## 2022-12-12 DIAGNOSIS — E1122 Type 2 diabetes mellitus with diabetic chronic kidney disease: Secondary | ICD-10-CM | POA: Diagnosis not present

## 2022-12-12 DIAGNOSIS — I130 Hypertensive heart and chronic kidney disease with heart failure and stage 1 through stage 4 chronic kidney disease, or unspecified chronic kidney disease: Secondary | ICD-10-CM | POA: Diagnosis not present

## 2022-12-12 DIAGNOSIS — A419 Sepsis, unspecified organism: Secondary | ICD-10-CM | POA: Diagnosis not present

## 2022-12-12 DIAGNOSIS — I5032 Chronic diastolic (congestive) heart failure: Secondary | ICD-10-CM | POA: Diagnosis not present

## 2022-12-13 ENCOUNTER — Ambulatory Visit (INDEPENDENT_AMBULATORY_CARE_PROVIDER_SITE_OTHER): Payer: Medicare Other | Admitting: "Endocrinology

## 2022-12-13 ENCOUNTER — Encounter: Payer: Self-pay | Admitting: "Endocrinology

## 2022-12-13 VITALS — BP 122/56 | HR 84 | Ht 75.0 in | Wt 258.6 lb

## 2022-12-13 DIAGNOSIS — E039 Hypothyroidism, unspecified: Secondary | ICD-10-CM

## 2022-12-13 DIAGNOSIS — Z6832 Body mass index (BMI) 32.0-32.9, adult: Secondary | ICD-10-CM | POA: Diagnosis not present

## 2022-12-13 DIAGNOSIS — E1159 Type 2 diabetes mellitus with other circulatory complications: Secondary | ICD-10-CM

## 2022-12-13 DIAGNOSIS — E6609 Other obesity due to excess calories: Secondary | ICD-10-CM

## 2022-12-13 DIAGNOSIS — E782 Mixed hyperlipidemia: Secondary | ICD-10-CM

## 2022-12-13 DIAGNOSIS — I10 Essential (primary) hypertension: Secondary | ICD-10-CM | POA: Diagnosis not present

## 2022-12-13 DIAGNOSIS — Z794 Long term (current) use of insulin: Secondary | ICD-10-CM | POA: Diagnosis not present

## 2022-12-13 LAB — POCT GLYCOSYLATED HEMOGLOBIN (HGB A1C): HbA1c, POC (controlled diabetic range): 7.5 % — AB (ref 0.0–7.0)

## 2022-12-13 MED ORDER — FREESTYLE LIBRE 3 SENSOR MISC: 1.0000 | 2 refills | Status: DC

## 2022-12-13 MED ORDER — FREESTYLE LIBRE 3 READER DEVI: 1.0000 | Freq: Once | 0 refills | Status: DC | PRN

## 2022-12-13 MED ORDER — DAPAGLIFLOZIN PROPANEDIOL 10 MG PO TABS: 10.0000 mg | ORAL_TABLET | Freq: Every day | ORAL | 1 refills | Status: DC

## 2022-12-13 NOTE — Progress Notes (Signed)
12/13/2022  Endocrinology follow-up note   Subjective:    Patient ID: Alex Cortez, male    DOB: 05/02/1950, PCP Leandrew Koyanagi Ananias Pilgrim, MD   Past Medical History:  Diagnosis Date   CAD (coronary artery disease)    nonobstructive CAD/patent LAD stent site; EF 65%, 08/2011; NSTEMI/DES LAD, 6/20 11   Chest pain    Diabetes mellitus    insulin-dependent   Dyslipidemia    HTN (hypertension)    Hypothyroidism    Myocardial infarction (HCC) 2011   Post MI syndrome (HCC) 08/29/2009   Stroke (HCC) 10/22/2012   Left inferior pons   Past Surgical History:  Procedure Laterality Date   COLONOSCOPY  08/2021   CORONARY ANGIOPLASTY WITH STENT PLACEMENT  2011   IR IMAGING GUIDED PORT INSERTION  04/26/2022   LYMPH NODE BIOPSY Right 04/14/2022   Procedure: RIGHT INGUINAL EXCISIONAL LYMPH NODE BIOPSY;  Surgeon: Fritzi Mandes, MD;  Location: [facility];  Service: General;  Laterality: Right;   Social History   Socioeconomic History   Marital status: Married    Spouse name: Not on file   Number of children: Not on file   Years of education: Not on file   Highest education level: Not on file  Occupational History   Not on file  Tobacco Use   Smoking status: Former    Current packs/day: 0.00    Average packs/day: 0.5 packs/day for 33.0 years (16.5 ttl pk-yrs)    Types: Cigarettes    Start date: 03/16/1955    Quit date: 03/15/1988    Years since quitting: 34.7   Smokeless tobacco: Never  Vaping Use   Vaping status: Never Used  Substance and Sexual Activity   Alcohol use: No    Alcohol/week: 0.0 standard drinks of alcohol   Drug use: No   Sexual activity: Not on file  Other Topics Concern   Not on file  Social History Narrative   Works at [employer]. Married.    Social Determinants of Health   Financial Resource Strain: Low Risk  (10/15/2022)   Overall Financial Resource Strain (CARDIA)    Difficulty of Paying Living Expenses: Not very hard  Food Insecurity: No Food Insecurity  (11/17/2022)   Hunger Vital Sign    Worried About Running Out of Food in the Last Year: Never true    Ran Out of Food in the Last Year: Never true  Transportation Needs: No Transportation Needs (12/06/2022)   PRAPARE - Administrator, Civil Service (Medical): No    Lack of Transportation (Non-Medical): No  Physical Activity: Inactive (11/22/2022)   Exercise Vital Sign    Days of Exercise per Week: 0 days    Minutes of Exercise per Session: 0 min  Stress: No Stress Concern Present (04/07/2022)   [employer] of Occupational Health - Occupational Stress Questionnaire    Feeling of Stress : Not at all  Social Connections: Unknown (07/31/2017)   Social Connection and Isolation Panel [NHANES]    Frequency of Communication with Friends and Family: Patient declined    Frequency of Social Gatherings with Friends and Family: Patient declined    Attends Religious Services: Patient declined    Active Member of Clubs or Organizations: Patient declined    Attends Banker Meetings: Patient declined    Marital Status: Patient declined   Outpatient Encounter Medications as of 12/13/2022  Medication Sig   Continuous Glucose Receiver (FREESTYLE LIBRE 3 READER) DEVI 1 Piece by Does not apply  route once as needed for up to 1 dose.   Continuous Glucose Sensor (FREESTYLE LIBRE 3 SENSOR) MISC 1 Piece by Does not apply route every 14 (fourteen) days. Place 1 sensor on the skin every 14 days. Use to check glucose continuously   allopurinol (ZYLOPRIM) 300 MG tablet TAKE 1 TABLET BY MOUTH EVERY DAY   carvedilol (COREG) 12.5 MG tablet Take 1 tablet (12.5 mg total) by mouth 2 (two) times daily with a meal.   clopidogrel (PLAVIX) 75 MG tablet Take 1 tablet (75 mg total) by mouth daily.   dapagliflozin propanediol (FARXIGA) 10 MG TABS tablet Take 1 tablet (10 mg total) by mouth daily with breakfast.   ezetimibe (ZETIA) 10 MG tablet TAKE 1 TABLET BY MOUTH EVERY DAY   insulin regular human  CONCENTRATED (HUMULIN R U-500 KWIKPEN) 500 UNIT/ML KwikPen INJECT AS DIRECTED 60 UNITS WITH BREAKFAST, 60 UNITS WITH LUNCH, AND 60 UNITS WITH SUPPER FOR PRE-MEAL BLOOD GLUCOSE READINGS OF 90MG /DL OR ABOVE   isosorbide mononitrate (IMDUR) 30 MG 24 hr tablet TAKE 1 TABLET BY MOUTH EVERY DAY   levothyroxine (SYNTHROID) 75 MCG tablet TAKE 1 TABLET BY MOUTH EVERY DAY BEFORE BREAKFAST   lidocaine-prilocaine (EMLA) cream Apply 1 Application topically as needed. (Patient taking differently: Apply 1 Application topically daily as needed (port access).)   metFORMIN (GLUCOPHAGE) 500 MG tablet TAKE 1 TABLET BY MOUTH EVERY DAY WITH BREAKFAST   nitroGLYCERIN (NITROSTAT) 0.4 MG SL tablet Place 1 tablet (0.4 mg total) under the tongue every 5 (five) minutes as needed.   ONE TOUCH ULTRA TEST test strip USE TO TEST BLOOD SUGAR FOUR TIMES DAILY E11.65   rosuvastatin (CRESTOR) 40 MG tablet TAKE 1 TABLET BY MOUTH EVERY DAY   [DISCONTINUED] Continuous Blood Gluc Receiver (FREESTYLE LIBRE 2 READER) DEVI As directed   [DISCONTINUED] Continuous Glucose Sensor (FREESTYLE LIBRE 2 SENSOR) MISC APPLY 1 SENSOR EVERY 14    DAYS   [DISCONTINUED] FARXIGA 5 MG TABS tablet TAKE 1 TABLET BY MOUTH EVERY DAY BEFORE BREAKFAST   No facility-administered encounter medications on file as of 12/13/2022.   ALLERGIES: Allergies  Allergen Reactions   Ace Inhibitors Swelling    Angioedema   Other Swelling    Shellfish   Swelling and hives  Shellfish  Swelling and hives   Rituxan [Rituximab] Other (See Comments)    Pt had sensitivity reaction to Rituxan. See progress note from 05/14/22. Pt able to complete infusion.    Shrimp [Shellfish Allergy] Hives and Swelling   VACCINATION STATUS: Immunization History  Administered Date(s) Administered   Influenza, High Dose Seasonal PF 12/31/2016   Moderna Sars-Covid-2 Vaccination 04/19/2019, 05/17/2019     Diabetes He presents for his follow-up diabetic visit. He has type 2 diabetes  mellitus. Onset time: He was diagnosed at approximate age of 55 years. His disease course has been improving. There are no hypoglycemic associated symptoms. Pertinent negatives for hypoglycemia include no confusion, headaches, pallor or seizures. Pertinent negatives for diabetes include no chest pain, no fatigue, no polydipsia, no polyphagia, no polyuria and no weakness. There are no hypoglycemic complications. Symptoms are worsening. Diabetic complications include a CVA, heart disease and nephropathy. Risk factors for coronary artery disease include diabetes mellitus, dyslipidemia, hypertension, male sex, obesity, sedentary lifestyle and tobacco exposure. Current diabetic treatment includes intensive insulin program. He is compliant with treatment most of the time. His weight is decreasing steadily. He is following a generally unhealthy diet. When asked about meal planning, he reported none. He has had a  previous visit with a dietitian. He never participates in exercise. His home blood glucose trend is decreasing steadily. His breakfast blood glucose range is generally 140-180 mg/dl. His lunch blood glucose range is generally 140-180 mg/dl. His dinner blood glucose range is generally 140-180 mg/dl. His bedtime blood glucose range is generally 140-180 mg/dl. His overall blood glucose range is 140-180 mg/dl. (Alex Cortez presents with his CGM device.  He has a better engagement than last visit.  His AGP report shows 44% time range, 50% level 1 hyperglycemia, 6% level 2 hyperglycemia.  He has no hypoglycemia.  His point-of-care A1c 7.5%, improving from 8.5% during his last visit.  His average blood glucose is 180 mg per DL for the last 14 days.  Admittedly, he continues to eat only twice a day his injecting insulin only twice a day.  ) An ACE inhibitor/angiotensin II receptor blocker is being taken. Eye exam is current.  Hyperlipidemia This is a chronic problem. The current episode started more than 1 year ago. The  problem is controlled. Exacerbating diseases include chronic renal disease, diabetes and obesity. Pertinent negatives include no chest pain, leg pain, myalgias or shortness of breath. Current antihyperlipidemic treatment includes statins. Risk factors for coronary artery disease include diabetes mellitus, dyslipidemia, hypertension, male sex, obesity and a sedentary lifestyle.  Hypertension This is a chronic problem. The current episode started more than 1 year ago. The problem is controlled. Pertinent negatives include no chest pain, headaches, neck pain, palpitations or shortness of breath. Risk factors for coronary artery disease include obesity, male gender, dyslipidemia, diabetes mellitus, sedentary lifestyle and family history. Past treatments include ACE inhibitors. Hypertensive end-organ damage includes kidney disease and CVA. Identifiable causes of hypertension include chronic renal disease.     Review of systems  Constitutional: + Gaining weight,  current  Body mass index is 32.32 kg/m. , no fatigue, no subjective hyperthermia, no subjective hypothermia    Objective:    BP (!) 122/56   Pulse 84   Ht 6\' 3"  (1.905 m)   Wt 258 lb 9.6 oz (117.3 kg)   BMI 32.32 kg/m   Wt Readings from Last 3 Encounters:  12/13/22 258 lb 9.6 oz (117.3 kg)  11/16/22 278 lb 3.2 oz (126.2 kg)  10/07/22 263 lb 9.6 oz (119.6 kg)      Physical Exam- Limited  Constitutional:  Body mass index is 32.32 kg/m. , not in acute distress, normal state of mind Eyes:  EOMI, no exophthalmos Neck: Supple        Latest Ref Rng & Units 11/18/2022    4:14 AM 11/17/2022    4:27 AM 11/16/2022    8:47 PM  CMP  Glucose 70 - 99 mg/dL 53  161  86   BUN 8 - 23 mg/dL 20  19  17    Creatinine 0.61 - 1.24 mg/dL 0.96  0.45  4.09   Sodium 135 - 145 mmol/L 137  138  143   Potassium 3.5 - 5.1 mmol/L 3.4  4.1  3.8   Chloride 98 - 111 mmol/L 105  104  107   CO2 22 - 32 mmol/L 20  25    Calcium 8.9 - 10.3 mg/dL 8.3  8.4        Lipid Panel     Component Value Date/Time   CHOL 135 12/26/2021 0000   CHOL 145 09/11/2021 0810   TRIG 88 12/26/2021 0000   HDL 35 12/26/2021 0000   HDL 35 (L) 09/11/2021 0810  CHOLHDL 4.1 09/11/2021 0810   CHOLHDL 4.5 08/01/2017 0243   VLDL 20 08/01/2017 0243   LDLCALC 82 12/26/2021 0000   LDLCALC 89 09/11/2021 0810   LABVLDL 21 09/11/2021 0810  A1c of 8.5% on July 11, 2022.   Assessment & Plan:   1. Type 2 diabetes mellitus with vascular disease (HCC)  -His  diabetes is complicated by recurrent CVA , coronary artery disease, obesity/sedentary life and patient remains at extremely  high risk for more acute and chronic complications of diabetes which include CAD, CVA, CKD, retinopathy, and neuropathy. These are all discussed in detail with the patient.  Alex Cortez presents with his CGM device.  He has a better engagement than last visit.  His AGP report shows 44% time range, 50% level 1 hyperglycemia, 6% level 2 hyperglycemia.  He has no hypoglycemia.  His point-of-care A1c 7.5%, improving from 8.5% during his last visit.  His average blood glucose is 180 mg per DL for the last 14 days.  Admittedly, he continues to eat only twice a day his injecting insulin only twice a day.   - Glucose logs and insulin administration records pertaining to this visit,  to be scanned into patient's records.  Recent labs reviewed.  He is diagnosed with lymphoma recently, being prepared for chemotherapy.  - I have re-counseled the patient on diet management and weight loss  by adopting a carbohydrate restricted / protein rich  Diet.  - he acknowledges that there is a room for improvement in his food and drink choices. - Suggestion is made for him to avoid simple carbohydrates  from his diet including Cakes, Sweet Desserts, Ice Cream, Soda (diet and regular), Sweet Tea, Candies, Chips, Cookies, Store Bought Juices, Alcohol , Artificial Sweeteners,  Coffee Creamer, and "Sugar-free" Products,  Lemonade. This will help patient to have more stable blood glucose profile and potentially avoid unintended weight gain.  The following Lifestyle Medicine recommendations according to American College of Lifestyle Medicine  [facility]) were discussed and and offered to patient and he  agrees to start the journey:  A. Whole Foods, Plant-Based Nutrition comprising of fruits and vegetables, plant-based proteins, whole-grain carbohydrates was discussed in detail with the patient.   A list for source of those nutrients were also provided to the patient.  Patient will use only water or unsweetened tea for hydration. B.  The need to stay away from risky substances including alcohol, smoking; obtaining 7 to 9 hours of restorative sleep, at least 150 minutes of moderate intensity exercise weekly, the importance of healthy social connections,  and stress management techniques were discussed. C.  A full color page of  Calorie density of various food groups per pound showing examples of each food groups was provided to the patient.   - Patient is advised to stick to a routine mealtimes to eat 3 meals  a day and avoid unnecessary snacks ( to snack only to correct hypoglycemia).  - I have approached patient with the following individualized plan to manage diabetes and patient agrees.  -  He promises to engage better.  He has done better with insulin U500 than basal/bolus insulin regimen.   He is advised to lower his Humulin U500 to 60 units 3 times daily AC for Premeal blood glucose readings of 90 mg per DL or above.   -I have advised him on how he can rotate insulin injection sites on his abdominal skin. -He is warned not not to take insulin without proper monitoring of  blood glucose.  He is advised to  call clinic if he registers blood glucose less than 70 mg/dL or greater than 161 mg/dL.  He will continue to benefit from  metformin-advised to lower metformin to 500 mg p.o. daily at breakfast.  -He is  benefiting from low-dose [place].  I discussed and increased his Farxiga to 10 mg p.o. daily at breakfast.  Side effects and precautions discussed with him.    - Patient specific target  for A1c; LDL, HDL, Triglycerides, and  Waist Circumference were discussed in detail.  2) BP/HTN: -His blood pressure is controlled to target.   He is currently on Coreg, Imdur.    3) Lipids/HPL: His lipid panel shows uncontrolled LDL at 82.  He will continue to benefit from a statin.  He is advised to continue atorvastatin 80 mg he is advised to continue Crestor 40 mg p.o. nightly.     Whole food plant-based diet will help with his lipid panel, however, patient did not engage optimally.  Side effects and precautions discussed with him.   4)  Weight/Diet: Comes with weight loss.  His BMI is down to 32.32.  Reportedly, he is eating less distress.  5) hypothyroidism- His recent  thyroid function tests are consistent with appropriate replacement.  He is advised to continue levothyroxine 75 mcg p.o. daily before breakfast.     - We discussed about the correct intake of his thyroid hormone, on empty stomach at fasting, with water, separated by at least 30 minutes from breakfast and other medications,  and separated by more than 4 hours from calcium, iron, multivitamins, acid reflux medications (PPIs). -Patient is made aware of the fact that thyroid hormone replacement is needed for life, dose to be adjusted by periodic monitoring of thyroid function tests.  6) Chronic Care/Health Maintenance:  -Patient  is  on ACEI/ARB and Statin medications and encouraged to continue to follow up with Ophthalmology, urologist given his recent recurrent CVA, podiatrist at least yearly or according to recommendations, and advised to  stay away from smoking. I have recommended yearly flu vaccine and pneumonia vaccination at least every 5 years; moderate intensity exercise for up to 150 minutes weekly; and  sleep for at least 7 hours a  day.  POCT ABI was normal on April 09, 2020, next study will be repeated in January 2027, or sooner if needed.    - I advised patient to maintain close follow up with Burdine, Ananias Pilgrim, MD for primary care needs.   I spent  42  minutes in the care of the patient today including review of labs from CMP, Lipids, Thyroid Function, Hematology (current and previous including abstractions from other facilities); face-to-face time discussing  his blood glucose readings/logs, discussing hypoglycemia and hyperglycemia episodes and symptoms, medications doses, his options of short and long term treatment based on the latest standards of care / guidelines;  discussion about incorporating lifestyle medicine;  and documenting the encounter. Risk reduction counseling performed per USPSTF guidelines to reduce  obesity and cardiovascular risk factors.     Please refer to Patient Instructions for Blood Glucose Monitoring and Insulin/Medications Dosing Guide"  in media tab for additional information. Please  also refer to " Patient Self Inventory" in the Media  tab for reviewed elements of pertinent patient history.  Alex Cortez participated in the discussions, expressed understanding, and voiced agreement with the above plans.  All questions were answered to his satisfaction. he is encouraged to contact clinic should he have  any questions or concerns prior to his return visit.    Follow up plan: -Return in about 4 months (around 04/14/2023) for Bring Meter/CGM Device/Logs- A1c in Office.  Marquis Lunch, MD Phone: [phone]  Fax: [phone]  -  This note was partially dictated with voice recognition software. Similar sounding words can be transcribed inadequately or may not  be corrected upon review.  12/13/2022, 2:32 PM

## 2022-12-13 NOTE — Patient Instructions (Signed)

## 2022-12-15 DIAGNOSIS — I700 Atherosclerosis of aorta: Secondary | ICD-10-CM | POA: Diagnosis not present

## 2022-12-15 DIAGNOSIS — R918 Other nonspecific abnormal finding of lung field: Secondary | ICD-10-CM | POA: Diagnosis not present

## 2022-12-15 DIAGNOSIS — J189 Pneumonia, unspecified organism: Secondary | ICD-10-CM | POA: Diagnosis not present

## 2022-12-15 DIAGNOSIS — J9811 Atelectasis: Secondary | ICD-10-CM | POA: Diagnosis not present

## 2022-12-18 DIAGNOSIS — J189 Pneumonia, unspecified organism: Secondary | ICD-10-CM | POA: Diagnosis not present

## 2022-12-18 DIAGNOSIS — D696 Thrombocytopenia, unspecified: Secondary | ICD-10-CM | POA: Diagnosis not present

## 2022-12-18 DIAGNOSIS — I69851 Hemiplegia and hemiparesis following other cerebrovascular disease affecting right dominant side: Secondary | ICD-10-CM | POA: Diagnosis not present

## 2022-12-18 DIAGNOSIS — A419 Sepsis, unspecified organism: Secondary | ICD-10-CM | POA: Diagnosis not present

## 2022-12-18 DIAGNOSIS — R6510 Systemic inflammatory response syndrome (SIRS) of non-infectious origin without acute organ dysfunction: Secondary | ICD-10-CM | POA: Diagnosis not present

## 2022-12-18 DIAGNOSIS — E1122 Type 2 diabetes mellitus with diabetic chronic kidney disease: Secondary | ICD-10-CM | POA: Diagnosis not present

## 2022-12-18 DIAGNOSIS — D72829 Elevated white blood cell count, unspecified: Secondary | ICD-10-CM | POA: Diagnosis not present

## 2022-12-18 DIAGNOSIS — C8330 Diffuse large B-cell lymphoma, unspecified site: Secondary | ICD-10-CM | POA: Diagnosis not present

## 2022-12-19 DIAGNOSIS — Z7902 Long term (current) use of antithrombotics/antiplatelets: Secondary | ICD-10-CM | POA: Diagnosis not present

## 2022-12-19 DIAGNOSIS — E1165 Type 2 diabetes mellitus with hyperglycemia: Secondary | ICD-10-CM | POA: Diagnosis not present

## 2022-12-19 DIAGNOSIS — A419 Sepsis, unspecified organism: Secondary | ICD-10-CM | POA: Diagnosis not present

## 2022-12-19 DIAGNOSIS — J189 Pneumonia, unspecified organism: Secondary | ICD-10-CM | POA: Diagnosis not present

## 2022-12-19 DIAGNOSIS — E1140 Type 2 diabetes mellitus with diabetic neuropathy, unspecified: Secondary | ICD-10-CM | POA: Diagnosis not present

## 2022-12-19 DIAGNOSIS — Z6821 Body mass index (BMI) 21.0-21.9, adult: Secondary | ICD-10-CM | POA: Diagnosis not present

## 2022-12-19 DIAGNOSIS — Z556 Problems related to health literacy: Secondary | ICD-10-CM | POA: Diagnosis not present

## 2022-12-19 DIAGNOSIS — E039 Hypothyroidism, unspecified: Secondary | ICD-10-CM | POA: Diagnosis not present

## 2022-12-19 DIAGNOSIS — I130 Hypertensive heart and chronic kidney disease with heart failure and stage 1 through stage 4 chronic kidney disease, or unspecified chronic kidney disease: Secondary | ICD-10-CM | POA: Diagnosis not present

## 2022-12-19 DIAGNOSIS — E1122 Type 2 diabetes mellitus with diabetic chronic kidney disease: Secondary | ICD-10-CM | POA: Diagnosis not present

## 2022-12-19 DIAGNOSIS — Z8572 Personal history of non-Hodgkin lymphomas: Secondary | ICD-10-CM | POA: Diagnosis not present

## 2022-12-19 DIAGNOSIS — I2510 Atherosclerotic heart disease of native coronary artery without angina pectoris: Secondary | ICD-10-CM | POA: Diagnosis not present

## 2022-12-19 DIAGNOSIS — E782 Mixed hyperlipidemia: Secondary | ICD-10-CM | POA: Diagnosis not present

## 2022-12-19 DIAGNOSIS — Z87891 Personal history of nicotine dependence: Secondary | ICD-10-CM | POA: Diagnosis not present

## 2022-12-19 DIAGNOSIS — E1159 Type 2 diabetes mellitus with other circulatory complications: Secondary | ICD-10-CM | POA: Diagnosis not present

## 2022-12-19 DIAGNOSIS — Z8673 Personal history of transient ischemic attack (TIA), and cerebral infarction without residual deficits: Secondary | ICD-10-CM | POA: Diagnosis not present

## 2022-12-19 DIAGNOSIS — I5032 Chronic diastolic (congestive) heart failure: Secondary | ICD-10-CM | POA: Diagnosis not present

## 2022-12-19 DIAGNOSIS — E669 Obesity, unspecified: Secondary | ICD-10-CM | POA: Diagnosis not present

## 2022-12-19 DIAGNOSIS — Z794 Long term (current) use of insulin: Secondary | ICD-10-CM | POA: Diagnosis not present

## 2022-12-19 DIAGNOSIS — R32 Unspecified urinary incontinence: Secondary | ICD-10-CM | POA: Diagnosis not present

## 2022-12-19 DIAGNOSIS — N189 Chronic kidney disease, unspecified: Secondary | ICD-10-CM | POA: Diagnosis not present

## 2022-12-19 DIAGNOSIS — I252 Old myocardial infarction: Secondary | ICD-10-CM | POA: Diagnosis not present

## 2022-12-19 DIAGNOSIS — Z7984 Long term (current) use of oral hypoglycemic drugs: Secondary | ICD-10-CM | POA: Diagnosis not present

## 2022-12-19 DIAGNOSIS — G4733 Obstructive sleep apnea (adult) (pediatric): Secondary | ICD-10-CM | POA: Diagnosis not present

## 2022-12-23 DIAGNOSIS — A419 Sepsis, unspecified organism: Secondary | ICD-10-CM | POA: Diagnosis not present

## 2022-12-23 DIAGNOSIS — I5032 Chronic diastolic (congestive) heart failure: Secondary | ICD-10-CM | POA: Diagnosis not present

## 2022-12-23 DIAGNOSIS — N189 Chronic kidney disease, unspecified: Secondary | ICD-10-CM | POA: Diagnosis not present

## 2022-12-23 DIAGNOSIS — J189 Pneumonia, unspecified organism: Secondary | ICD-10-CM | POA: Diagnosis not present

## 2022-12-23 DIAGNOSIS — E1122 Type 2 diabetes mellitus with diabetic chronic kidney disease: Secondary | ICD-10-CM | POA: Diagnosis not present

## 2022-12-23 DIAGNOSIS — I130 Hypertensive heart and chronic kidney disease with heart failure and stage 1 through stage 4 chronic kidney disease, or unspecified chronic kidney disease: Secondary | ICD-10-CM | POA: Diagnosis not present

## 2022-12-25 ENCOUNTER — Other Ambulatory Visit: Payer: Self-pay | Admitting: "Endocrinology

## 2022-12-27 ENCOUNTER — Other Ambulatory Visit: Payer: Self-pay | Admitting: Nurse Practitioner

## 2022-12-27 MED ORDER — EZETIMIBE 10 MG PO TABS: 10.0000 mg | ORAL_TABLET | Freq: Every day | ORAL | 2 refills | Status: DC

## 2022-12-29 DIAGNOSIS — I5032 Chronic diastolic (congestive) heart failure: Secondary | ICD-10-CM | POA: Diagnosis not present

## 2022-12-29 DIAGNOSIS — I130 Hypertensive heart and chronic kidney disease with heart failure and stage 1 through stage 4 chronic kidney disease, or unspecified chronic kidney disease: Secondary | ICD-10-CM | POA: Diagnosis not present

## 2022-12-29 DIAGNOSIS — J189 Pneumonia, unspecified organism: Secondary | ICD-10-CM | POA: Diagnosis not present

## 2022-12-29 DIAGNOSIS — A419 Sepsis, unspecified organism: Secondary | ICD-10-CM | POA: Diagnosis not present

## 2022-12-29 DIAGNOSIS — N189 Chronic kidney disease, unspecified: Secondary | ICD-10-CM | POA: Diagnosis not present

## 2022-12-29 DIAGNOSIS — E1122 Type 2 diabetes mellitus with diabetic chronic kidney disease: Secondary | ICD-10-CM | POA: Diagnosis not present

## 2023-01-03 ENCOUNTER — Encounter: Payer: Self-pay | Admitting: *Deleted

## 2023-01-03 ENCOUNTER — Ambulatory Visit: Payer: Self-pay | Admitting: *Deleted

## 2023-01-03 NOTE — Patient Outreach (Signed)
Care Coordination   Follow Up Visit Note   01/03/2023 Name: Alex Cortez MRN: [id number] DOB: 08/09/1950  Alex Cortez is a 72 y.o. year old male who sees Burdine, Alex Pilgrim, MD for primary care. I spoke with  [name] by phone today.  What matters to the patients health and wellness today?  Continuing to follow-up with providers and manage blood pressure, blood sugar, and lymphoma.     Goals Addressed               This Visit's Progress     Patient Stated     COMPLETED: DM management (pt-stated)   On track     Care Coordination Goals: Patient will follow-up with endocrinologist every 3 months Patient will take medication as prescribed and reach out to provider with any negative side effects Patient will continue to monitor and record blood sugar 3 times per day and as needed with continuous glucose monitor, and will call endocrinologist with any readings outside of recommended range Patient will take blood sugar log and meter to provider visits for review Patient will follow a modified carbohydrate diet and decrease simple carbohydrates and sugars 3 meals per day with 30 grams of carbohydrates and up to 2 snacks per day, if needed, with less than 15 grams of carbohydrates Patient will increase activity level as tolerated with an ultimate goal of at least 150 minutes of exercise per week  Patient does not have any acute or urgent needs related to this goal and will follow-up with PCP regarding management.         Other     COMPLETED: Lymphoma Management   On track     Care Coordination Goals: Patient will keep all scheduled medical and imaging appointments Patient will eat a balanced diet to aid in general health, healing, and energy levels Patient will remain socially engaged with friends/family  Patient does not have any acute or urgent needs related to this goal and will follow-up with PCP regarding management.       COMPLETED: Manage HTN   On track      Care Coordination Goals: Patient will take medications as directed and report any negative side effects to provider  Patient will monitor and record blood pressure 3 times a week and as needed and will call PCP or specialist with any readings outside of recommended range Patient will keep all recommended follow-up appointments with PCP and specialists  Patient will take blood pressure log to PCP and specialty appointments for review Patient will follow a low sodium/DASH diet   Patient does not have any acute or urgent needs related to this goal and will follow-up with PCP regarding management.         SDOH assessments and interventions completed:  Yes  SDOH Interventions Today    Flowsheet Row Most Recent Value  SDOH Interventions   Housing Interventions Intervention Not Indicated  Transportation Interventions Intervention Not Indicated  Financial Strain Interventions Intervention Not Indicated        Care Coordination Interventions:  Yes, provided  Interventions Today    Flowsheet Row Most Recent Value  Chronic Disease   Chronic disease during today's visit Diabetes, Hypertension (HTN), Other  [lymphoma]  General Interventions   General Interventions Discussed/Reviewed General Interventions Discussed, General Interventions Reviewed, Annual Eye Exam, Labs, Annual Foot Exam, Durable Medical Equipment (DME), Doctor Visits  Labs Hgb A1c every 3 months  Doctor Visits Discussed/Reviewed Doctor Visits Discussed, Doctor Visits Reviewed, Annual Wellness  Visits, PCP, Specialist  Durable Medical Equipment (DME) Glucomoter, BP Cuff  [reports blood pressure was "good" when he last checked it. No blood sugar readings to discuss.]  PCP/Specialist Visits Compliance with follow-up visit  [Dr Leonides Schanz (oncology) on 01/06/23, Dr Fransico Him (endocrinology) on 2/-4/25, Sharlene Dory, NP (cardiology) on 05/19/23]  Exercise Interventions   Exercise Discussed/Reviewed Exercise Discussed, Exercise  Reviewed, Physical Activity  [Able to perform ADLs]  Physical Activity Discussed/Reviewed Physical Activity Discussed, Physical Activity Reviewed  [encouraged to increase physical activity as tolerated with an ultimate goal of 150 minutes per week]  Education Interventions   Education Provided Provided Education  Provided Verbal Education On Nutrition, Foot Care, Eye Care, Labs, Blood Sugar Monitoring, When to see the doctor, Exercise, Medication, Other  [blood pressure monitoring]  Labs Reviewed Hgb A1c  [12/13/22 A1C 7.5%. Improved from 8.5% 5 months ago.]  Nutrition Interventions   Nutrition Discussed/Reviewed Nutrition Discussed, Nutrition Reviewed, Adding fruits and vegetables, Fluid intake, Carbohydrate meal planning, Portion sizes, Decreasing sugar intake, Increasing proteins  [Eat 3 meals per day with 30 GM of CHO and up to 2 snacks per day, if needed, with less than 15 GM of CHO.]  Pharmacy Interventions   Pharmacy Dicussed/Reviewed Pharmacy Topics Discussed, Pharmacy Topics Reviewed, Medications and their functions  [completed antibiotics for pneumonia. Taking routine medications as directed.]  Safety Interventions   Safety Discussed/Reviewed Safety Discussed, Safety Reviewed, Fall Risk, Home Safety  Home Safety Assistive Devices  Advanced Directive Interventions   Advanced Directives Discussed/Reviewed Advanced Directives Discussed, Advanced Care Planning  [encouraged to consider]      Follow up plan: No further intervention required. Patient's Primary Care office is not partnering with the [facility] for care management and will be providing Care Management Services themselves. This final Care Management note will be securely faxed to the PCP office for handoff. Patient has been encouraged to reach out to their PCP office with any resource or care management needs.   Encounter Outcome:  Patient Visit Completed   Demetrios Loll, RN, BSN Care Management Coordinator [facility]  [facility] Direct Dial: [phone] Main #: [phone]

## 2023-01-05 ENCOUNTER — Encounter: Payer: Medicare Other | Admitting: *Deleted

## 2023-01-06 ENCOUNTER — Other Ambulatory Visit: Payer: Self-pay | Admitting: Hematology and Oncology

## 2023-01-06 ENCOUNTER — Inpatient Hospital Stay: Payer: Medicare Other | Admitting: Hematology and Oncology

## 2023-01-06 ENCOUNTER — Inpatient Hospital Stay: Payer: Medicare Other | Attending: Physician Assistant

## 2023-01-06 VITALS — BP 122/72 | HR 80 | Temp 97.8°F | Resp 16 | Wt 253.3 lb

## 2023-01-06 DIAGNOSIS — C8338 Diffuse large B-cell lymphoma, lymph nodes of multiple sites: Secondary | ICD-10-CM | POA: Insufficient documentation

## 2023-01-06 DIAGNOSIS — D509 Iron deficiency anemia, unspecified: Secondary | ICD-10-CM

## 2023-01-06 DIAGNOSIS — Z87891 Personal history of nicotine dependence: Secondary | ICD-10-CM | POA: Insufficient documentation

## 2023-01-06 DIAGNOSIS — Z95828 Presence of other vascular implants and grafts: Secondary | ICD-10-CM

## 2023-01-06 LAB — FOLATE: Folate: 8.6 ng/mL (ref >5.9)

## 2023-01-06 LAB — RETIC PANEL
Immature Retic Fract: 12.3 % (ref 2.3–15.9)
RBC.: 5.04 MIL/uL (ref 4.22–5.81)
Retic Count, Absolute: 63.5 10*3/uL (ref 19.0–186.0)
Retic Ct Pct: 1.3 % (ref 0.4–3.1)
Reticulocyte Hemoglobin: 28.2 pg (ref >27.9)

## 2023-01-06 LAB — IRON AND IRON BINDING CAPACITY (CC-WL,HP ONLY)
Iron: 51 ug/dL (ref 45–182)
Saturation Ratios: 17 % — ABNORMAL LOW (ref 17.9–39.5)
TIBC: 307 ug/dL (ref 250–450)
UIBC: 256 ug/dL (ref 117–376)

## 2023-01-06 LAB — CBC WITH DIFFERENTIAL (CANCER CENTER ONLY)
Abs Immature Granulocytes: 0.01 10*3/uL (ref 0.00–0.07)
Basophils Absolute: 0.1 10*3/uL (ref 0.0–0.1)
Basophils Relative: 1 %
Eosinophils Absolute: 0.2 10*3/uL (ref 0.0–0.5)
Eosinophils Relative: 3 %
HCT: 39.1 % (ref 39.0–52.0)
Hemoglobin: 13 g/dL (ref 13.0–17.0)
Immature Granulocytes: 0 %
Lymphocytes Relative: 28 %
Lymphs Abs: 1.7 10*3/uL (ref 0.7–4.0)
MCH: 26.1 pg (ref 26.0–34.0)
MCHC: 33.2 g/dL (ref 30.0–36.0)
MCV: 78.4 fL — ABNORMAL LOW (ref 80.0–100.0)
Monocytes Absolute: 0.3 10*3/uL (ref 0.1–1.0)
Monocytes Relative: 5 %
Neutro Abs: 3.8 10*3/uL (ref 1.7–7.7)
Neutrophils Relative %: 63 %
Platelet Count: 267 10*3/uL (ref 150–400)
RBC: 4.99 MIL/uL (ref 4.22–5.81)
RDW: 14.9 % (ref 11.5–15.5)
Smear Review: NORMAL
WBC Count: 6 10*3/uL (ref 4.0–10.5)
nRBC: 0 % (ref 0.0–0.2)

## 2023-01-06 LAB — CMP (CANCER CENTER ONLY)
ALT: 26 U/L (ref 0–44)
AST: 17 U/L (ref 15–41)
Albumin: 4.2 g/dL (ref 3.5–5.0)
Alkaline Phosphatase: 56 U/L (ref 38–126)
Anion gap: 9 (ref 5–15)
BUN: 22 mg/dL (ref 8–23)
CO2: 26 mmol/L (ref 22–32)
Calcium: 9.7 mg/dL (ref 8.9–10.3)
Chloride: 107 mmol/L (ref 98–111)
Creatinine: 1.14 mg/dL (ref 0.61–1.24)
GFR, Estimated: >60 mL/min (ref >60)
Glucose, Bld: 199 mg/dL — ABNORMAL HIGH (ref 70–99)
Potassium: 4 mmol/L (ref 3.5–5.1)
Sodium: 142 mmol/L (ref 135–145)
Total Bilirubin: 0.6 mg/dL (ref 0.3–1.2)
Total Protein: 7.6 g/dL (ref 6.5–8.1)

## 2023-01-06 LAB — VITAMIN B12: Vitamin B-12: 2581 pg/mL — ABNORMAL HIGH (ref 180–914)

## 2023-01-06 LAB — LACTATE DEHYDROGENASE: LDH: 275 U/L — ABNORMAL HIGH (ref 98–192)

## 2023-01-06 LAB — FERRITIN: Ferritin: 101 ng/mL (ref 24–336)

## 2023-01-06 NOTE — Progress Notes (Signed)
[facility] [phone]   [phone]  PROGRESS NOTE  Patient Care Team: Juliette Alcide, MD as PCP - General Branch, Dorothe Pea, MD as PCP - Cardiology (Cardiology) Roma Kayser, MD as Consulting Physician (Endocrinology)  Hematological/Oncological History # Diffuse Large B Cell Lymphoma, Stage III 03/16/2022: establish care with Dr. Leonides Schanz in [facility] 04/14/2022: excisional lymph node biopsy of right inguinal node shows DLBCL arising from follicular lymphoma 04/21/2022: PET CT scan showed widespread hypermetabolic lymphadenopathy in the neck, chest, abdomen, and pelvis 05/06/2021: Defer Cycle 1 Day 1 of R-CHOP chemotherapy. Patient has wound dehiscence and possible infection.  Seen by surgery same day. 05/14/2022: Cycle 1 Day 1 of R-CHOP chemotherapy 06/03/2022: Cycle 2 Day 1 of R-CHOP chemotherapy 06/25/2022: Cycle 3 Day 1 of R-CHOP chemotherapy 07/16/2022: Cycle 4, Day 1 of R-CHOP chemotherapy HELD due to healing right groin abscess 07/22/2022: PET CT Scan shows interval response to therapy with residual borderline to minimally hypermetabolic adenopathy in the abdomen and pelvis 07/23/2022: Cycle 4, Day 1 of R-CHOP 08/13/2022: Cycle 5, Day 1 of R-CHOP 09/03/2022: Cycle 6, Day 1 of R-CHOP 10/25/2022: PET CT Scan shows Deauville 3, no evidence of residual/recurrent disease.   Interval History:  Alex Cortez 72 y.o. male with medical history significant for newly diagnosed diffuse large B-cell lymphoma who presents for a follow up visit. The patient's last visit was on 10/07/2022. In the interim since the last visit he had a hospitalization from 11/16/2022 to 11/18/2022 for pneumonia.  On exam today Alex Cortez reports he has been losing weight unintentionally because he feels like food does not taste right.  He notes that it is slowly improving but is not yet back to normal.  He reports he was eating some soup last night and he just did not  taste right.  He reports that there are no particular foods that are causing him trouble.  He notes that he has not had any issues with nausea, vomiting, or diarrhea.  He denies any runny nose, sore throat, or cough.  He did have a brief viral illness for about 24 hours last week where he did have chills but those have subsequently resolved.  He reports has not been having any issues with bleeding, bruising, or dark stools.  He is not having any swelling or pain at his prior surgical site.  He reports that his energy levels overall are good though he is not sleeping particular well because he has some chronic hip and back pain.  He reports otherwise he has been well and has no questions concerns or complaints today.  He denies fevers, chills, sweats, chest pain or cough. He has no other complaints. Rest of the 10 point ROS is below.    MEDICAL HISTORY:  Past Medical History:  Diagnosis Date   CAD (coronary artery disease)    nonobstructive CAD/patent LAD stent site; EF 65%, 08/2011; NSTEMI/DES LAD, 6/20 11   Chest pain    Diabetes mellitus    insulin-dependent   Dyslipidemia    HTN (hypertension)    Hypothyroidism    Myocardial infarction [facility]) 2011   Post MI syndrome (HCC) 08/29/2009   Stroke (HCC) 10/22/2012   Left inferior pons    SURGICAL HISTORY: Past Surgical History:  Procedure Laterality Date   COLONOSCOPY  08/2021   CORONARY ANGIOPLASTY WITH STENT PLACEMENT  2011   IR IMAGING GUIDED PORT INSERTION  04/26/2022   LYMPH NODE BIOPSY Right 04/14/2022   Procedure:  RIGHT INGUINAL EXCISIONAL LYMPH NODE BIOPSY;  Surgeon: Fritzi Mandes, MD;  Location: [facility];  Service: General;  Laterality: Right;    SOCIAL HISTORY: Social History   Socioeconomic History   Marital status: Married    Spouse name: Not on file   Number of children: Not on file   Years of education: Not on file   Highest education level: Not on file  Occupational History   Not on file  Tobacco Use   Smoking  status: Former    Current packs/day: 0.00    Average packs/day: 0.5 packs/day for 33.0 years (16.5 ttl pk-yrs)    Types: Cigarettes    Start date: 03/16/1955    Quit date: 03/15/1988    Years since quitting: 34.8   Smokeless tobacco: Never  Vaping Use   Vaping status: Never Used  Substance and Sexual Activity   Alcohol use: No    Alcohol/week: 0.0 standard drinks of alcohol   Drug use: No   Sexual activity: Not on file  Other Topics Concern   Not on file  Social History Narrative   Works at [employer]. Married.    Social Determinants of Health   Financial Resource Strain: Low Risk  (01/03/2023)   Overall Financial Resource Strain (CARDIA)    Difficulty of Paying Living Expenses: Not very hard  Food Insecurity: No Food Insecurity (11/17/2022)   Hunger Vital Sign    Worried About Running Out of Food in the Last Year: Never true    Ran Out of Food in the Last Year: Never true  Transportation Needs: No Transportation Needs (01/03/2023)   PRAPARE - Administrator, Civil Service (Medical): No    Lack of Transportation (Non-Medical): No  Physical Activity: Inactive (11/22/2022)   Exercise Vital Sign    Days of Exercise per Week: 0 days    Minutes of Exercise per Session: 0 min  Stress: No Stress Concern Present (04/07/2022)   [employer] of Occupational Health - Occupational Stress Questionnaire    Feeling of Stress : Not at all  Social Connections: Unknown (07/31/2017)   Social Connection and Isolation Panel [NHANES]    Frequency of Communication with Friends and Family: Patient declined    Frequency of Social Gatherings with Friends and Family: Patient declined    Attends Religious Services: Patient declined    Database administrator or Organizations: Patient declined    Attends Banker Meetings: Patient declined    Marital Status: Patient declined  Intimate Partner Violence: Not At Risk (11/17/2022)   Humiliation, Afraid, Rape, and Kick questionnaire     Fear of Current or Ex-Partner: No    Emotionally Abused: No    Physically Abused: No    Sexually Abused: No    FAMILY HISTORY: Family History  Problem Relation Age of Onset   Colon cancer Mother    Hypertension Other        in all family members    ALLERGIES:  is allergic to ace inhibitors, other, rituxan [rituximab], and shrimp [shellfish allergy].  MEDICATIONS:  Current Outpatient Medications  Medication Sig Dispense Refill   allopurinol (ZYLOPRIM) 300 MG tablet TAKE 1 TABLET BY MOUTH EVERY DAY 90 tablet 1   carvedilol (COREG) 12.5 MG tablet Take 1 tablet (12.5 mg total) by mouth 2 (two) times daily with a meal. 60 tablet 0   clopidogrel (PLAVIX) 75 MG tablet Take 1 tablet (75 mg total) by mouth daily. 30 tablet 5   Continuous Glucose  Receiver (FREESTYLE LIBRE 3 READER) DEVI 1 Piece by Does not apply route once as needed for up to 1 dose. 1 each 0   Continuous Glucose Sensor (FREESTYLE LIBRE 3 SENSOR) MISC 1 Piece by Does not apply route every 14 (fourteen) days. Place 1 sensor on the skin every 14 days. Use to check glucose continuously 2 each 2   dapagliflozin propanediol (FARXIGA) 10 MG TABS tablet Take 1 tablet (10 mg total) by mouth daily with breakfast. 90 tablet 1   ezetimibe (ZETIA) 10 MG tablet Take 1 tablet (10 mg total) by mouth daily. 90 tablet 2   insulin regular human CONCENTRATED (HUMULIN R U-500 KWIKPEN) 500 UNIT/ML KwikPen INJECT AS DIRECTED 60 UNITS WITH BREAKFAST, 60 UNITS WITH LUNCH, AND 60 UNITS WITH SUPPER FOR PRE-MEAL BLOOD GLUCOSE READINGS OF 90MG /DL OR ABOVE     isosorbide mononitrate (IMDUR) 30 MG 24 hr tablet TAKE 1 TABLET BY MOUTH EVERY DAY 90 tablet 1   levothyroxine (SYNTHROID) 75 MCG tablet TAKE 1 TABLET BY MOUTH EVERY DAY BEFORE BREAKFAST 90 tablet 0   lidocaine-prilocaine (EMLA) cream Apply 1 Application topically as needed. (Patient taking differently: Apply 1 Application topically daily as needed (port access).) 30 g 0   metFORMIN (GLUCOPHAGE) 500 MG  tablet TAKE 1 TABLET BY MOUTH EVERY DAY WITH BREAKFAST 90 tablet 0   nitroGLYCERIN (NITROSTAT) 0.4 MG SL tablet Place 1 tablet (0.4 mg total) under the tongue every 5 (five) minutes as needed. 25 tablet 3   ONE TOUCH ULTRA TEST test strip USE TO TEST BLOOD SUGAR FOUR TIMES DAILY E11.65 150 each 5   rosuvastatin (CRESTOR) 40 MG tablet TAKE 1 TABLET BY MOUTH EVERY DAY 90 tablet 2   No current facility-administered medications for this visit.    REVIEW OF SYSTEMS:   Constitutional: ( - ) fevers, ( - )  chills , ( - ) night sweats Eyes: ( - ) blurriness of vision, ( - ) double vision, ( - ) watery eyes Ears, nose, mouth, throat, and face: ( - ) mucositis, ( - ) sore throat Respiratory: ( - ) cough, ( + ) dyspnea, ( - ) wheezes Cardiovascular: ( - ) palpitation, ( - ) chest discomfort, ( - ) lower extremity swelling Gastrointestinal:  ( - ) nausea, ( - ) heartburn, ( - ) change in bowel habits Skin: ( - ) abnormal skin rashes Lymphatics: ( - ) new lymphadenopathy, ( - ) easy bruising Neurological: ( + ) numbness, ( - ) tingling, ( - ) new weaknesses Behavioral/Psych: ( - ) mood change, ( - ) new changes  All other systems were reviewed with the patient and are negative.  PHYSICAL EXAMINATION: ECOG PERFORMANCE STATUS: 1 - Symptomatic but completely ambulatory  Vitals:   01/06/23 1459  BP: 122/72  Pulse: 80  Resp: 16  Temp: 97.8 F (36.6 C)  SpO2: 96%      Filed Weights   01/06/23 1459  Weight: 253 lb 4.8 oz (114.9 kg)       Day 1, Cycle 4 07/23/22  Height 6\' 3"  (1.905 m)  Weight 276 lb 12.8 oz (125.6 kg)  BSA (Calculated - sq m) 2.58 sq meters  Temp 98.2 F (36.8 C)  Temp src Oral  Pulse 77  Resp 20  BP 109/57 !     GENERAL: Well-appearing elderly African-American male alert, no distress and comfortable SKIN: skin color, texture, turgor are normal, no rashes or significant lesions. EYES: conjunctiva are pink and non-injected, sclera  clear LUNGS: clear to  auscultation and percussion with normal breathing effort HEART: regular rate & rhythm and no murmurs and no lower extremity edema Musculoskeletal: no cyanosis of digits and no clubbing  PSYCH: alert & oriented x 3, fluent speech NEURO: no focal motor/sensory deficits  LABORATORY DATA:  I have reviewed the data as listed    Latest Ref Rng & Units 01/06/2023    2:27 PM 11/18/2022    4:14 AM 11/17/2022    4:27 AM  CBC  WBC 4.0 - 10.5 K/uL 6.0  11.5  13.6   Hemoglobin 13.0 - 17.0 g/dL 82.9  56.2  13.0   Hematocrit 39.0 - 52.0 % 39.1  33.4  37.1   Platelets 150 - 400 K/uL 267  158  159        Latest Ref Rng & Units 01/06/2023    2:27 PM 11/18/2022    4:14 AM 11/17/2022    4:27 AM  CMP  Glucose 70 - 99 mg/dL 865  53  784   BUN 8 - 23 mg/dL 22  20  19    Creatinine 0.61 - 1.24 mg/dL 6.96  2.95  2.84   Sodium 135 - 145 mmol/L 142  137  138   Potassium 3.5 - 5.1 mmol/L 4.0  3.4  4.1   Chloride 98 - 111 mmol/L 107  105  104   CO2 22 - 32 mmol/L 26  20  25    Calcium 8.9 - 10.3 mg/dL 9.7  8.3  8.4   Total Protein 6.5 - 8.1 g/dL 7.6     Total Bilirubin 0.3 - 1.2 mg/dL 0.6     Alkaline Phos 38 - 126 U/L 56     AST 15 - 41 U/L 17     ALT 0 - 44 U/L 26       RADIOGRAPHIC STUDIES: I have personally reviewed the radiological images as listed and agreed with the findings in the report: Diffuse involving the lymph nodes in the neck, underarms, chest, abdomen and groin. No results found.  ASSESSMENT & PLAN Alex Cortez 72 y.o. male with medical history significant for newly diagnosed diffuse large B-cell lymphoma who presents for a follow up visit.  At this time findings are most consistent with diffuse large B-cell lymphoma stage III.  The difference tween stage III and stage IV is academic as there is no difference in treatment.  We will plan to proceed with 6 cycles of R-CHOP chemotherapy.  Chemotherapy will be administered every 3 weeks with G-CSF therapy to be provided on day 3.  Patient  will be administered chemotherapy of full-strength.  His echocardiogram shows good baseline cardiac function.  Also his labs are strong at baseline.  The patient voices understanding of the chemotherapy and the plan moving forward.  # Diffuse Large B Cell Lymphoma Stage III/IV -- At this time findings are most consistent with a diffuse large B-cell lymphoma arising from follicular lymphoma, stage III/IV.  Would favor stage 3 as there is no clear evidence of organ involvement, may have bone marrow involvement. -- Will proceed with R-CHOP chemotherapy.  Discussed the risks and benefits of this therapy as well as the anticipated side effects and scheduling. -- Cycle 1 Day 1 of R-CHOP chemotherapy on 05/14/2022 Plan: -- patient completed 6 cycles of R-CHOP chemotherapy.  --Labs today show white blood cell count 6.0, hemoglobin 13.0, MCV 78.4, and platelets of 267. Creatinine and LFTs normal.  --post treatment PET scan performed on 10/25/2022  showed response to therapy. Repeat CT scan due in Feb 2024.    --RTC in 3 months  #Microcytic anemia: --Labs today show Hgb 10.2, improving --Iron panel checked on 05/12/2022 without deficiency.   # Wound Dehiscence/Wound Infection--recurrence --Completed antibiotic therapy with Bactrim. --Was evaluated by surgical team on 05/10/2022 with improvement of wound. They gave clearance to start chemotherapy scheduled for 05/14/2022.  --Readmitted 07/11/2022-07/14/2022 right groin cellulitis/abscess. He underwent I&D on 4/29 with culture that demonstrated growth of staph aureus that was pansensitivity. He was transition to oral antibiotics for 7 days with bactrim at discharge. --Had follow up with Dr. Freida Busman on 07/22/2022. Gave clearance to resume chemotherapy. Patient was advised to gently pack the opening with a small piece of gauze for about 3 days and then he no longer needs to pack it with hopes that it will close up quickly.  -- Wound was redressed by nurse Beth  today.  #Supportive Care -- port can be removed at this time, order has been replaced for removal.  Orders Placed This Encounter  Procedures   IR Removal Tun Access W/ Port W/O FL    Standing Status:   Future    Standing Expiration Date:   01/06/2024    Order Specific Question:   Reason for exam:    Answer:   chemo complete, OK to remove port    Order Specific Question:   Preferred Imaging Location?    Answer:   [facility]   CT CHEST ABDOMEN PELVIS W CONTRAST    Standing Status:   Future    Standing Expiration Date:   01/06/2024    Order Specific Question:   If indicated for the ordered procedure, I authorize the administration of contrast media per Radiology protocol    Answer:   Yes    Order Specific Question:   Does the patient have a contrast media/X-ray dye allergy?    Answer:   No    Order Specific Question:   Preferred imaging location?    Answer:   [facility]    Order Specific Question:   If indicated for the ordered procedure, I authorize the administration of oral contrast media per Radiology protocol    Answer:   Yes   All questions were answered. The patient knows to call the clinic with any problems, questions or concerns.  A total of more than 30 minutes were spent on this encounter with face-to-face time and non-face-to-face time, including preparing to see the patient, ordering tests and/or medications, counseling the patient and coordination of care as outlined above.   Ulysees Barns, MD Department of Hematology/Oncology [facility] Phone: [phone] Pager: [phone] Email: [email or web address]  01/06/2023 4:13 PM

## 2023-01-09 LAB — METHYLMALONIC ACID, SERUM: Methylmalonic Acid, Quantitative: 225 nmol/L (ref 0–378)

## 2023-01-18 ENCOUNTER — Telehealth: Payer: Self-pay | Admitting: "Endocrinology

## 2023-01-18 ENCOUNTER — Other Ambulatory Visit: Payer: Self-pay

## 2023-01-18 MED ORDER — HUMULIN R U-500 KWIKPEN 500 UNIT/ML ~~LOC~~ SOPN: PEN_INJECTOR | SUBCUTANEOUS | 0 refills | Status: DC

## 2023-01-18 NOTE — Telephone Encounter (Signed)
Rx sent 

## 2023-01-18 NOTE — Telephone Encounter (Signed)
Pt needs refill of humalin 500 pen sent to [facility]

## 2023-01-20 ENCOUNTER — Ambulatory Visit (HOSPITAL_COMMUNITY)
Admission: RE | Admit: 2023-01-20 | Discharge: 2023-01-20 | Disposition: A | Discharge status: Home / Self Care | Payer: Medicare Other | Source: Ambulatory Visit | Attending: Hematology and Oncology | Admitting: Hematology and Oncology

## 2023-01-20 DIAGNOSIS — Z452 Encounter for adjustment and management of vascular access device: Secondary | ICD-10-CM | POA: Diagnosis not present

## 2023-01-20 DIAGNOSIS — C8590 Non-Hodgkin lymphoma, unspecified, unspecified site: Secondary | ICD-10-CM | POA: Diagnosis not present

## 2023-01-20 DIAGNOSIS — Z8572 Personal history of non-Hodgkin lymphomas: Secondary | ICD-10-CM | POA: Diagnosis not present

## 2023-01-20 DIAGNOSIS — Z95828 Presence of other vascular implants and grafts: Secondary | ICD-10-CM

## 2023-01-20 HISTORY — PX: IR REMOVAL TUN ACCESS W/ PORT W/O FL MOD SED: IMG2290

## 2023-01-20 MED ORDER — LIDOCAINE HCL 1 % IJ SOLN
20.0000 mL | Freq: Once | INTRAMUSCULAR | Status: AC
  Administered 2023-01-20: 10 mL via INTRADERMAL

## 2023-01-20 MED ORDER — LIDOCAINE HCL 1 % IJ SOLN
INTRAMUSCULAR | Status: AC
  Filled 2023-01-20: qty 20

## 2023-01-20 NOTE — Procedures (Signed)
Interventional Radiology Procedure:   Indications: History of lymphoma and no longer needs port  Procedure: Port removal  Findings: Complete removal of right chest port.  Complications: None     EBL: Minimal  Plan: Keep incision covered and dry for 24 hours.   Ariza Evans R. Lowella Dandy, MD  Pager: [phone]

## 2023-01-21 ENCOUNTER — Other Ambulatory Visit: Payer: Self-pay | Admitting: "Endocrinology

## 2023-01-25 ENCOUNTER — Telehealth: Payer: Self-pay | Admitting: Nurse Practitioner

## 2023-01-25 DIAGNOSIS — E1159 Type 2 diabetes mellitus with other circulatory complications: Secondary | ICD-10-CM

## 2023-01-25 MED ORDER — DAPAGLIFLOZIN PROPANEDIOL 10 MG PO TABS: 10.0000 mg | ORAL_TABLET | Freq: Every day | ORAL | 0 refills | Status: DC

## 2023-01-25 NOTE — Telephone Encounter (Signed)
[facility] said that [facility] said [facility] needs [facility] RX on his Farxiga 10 MG. [facility] [place]

## 2023-01-25 NOTE — Telephone Encounter (Signed)
Rx refill for Farxiga 10mg  daily sent to [facility] [place].

## 2023-02-01 DIAGNOSIS — Z6833 Body mass index (BMI) 33.0-33.9, adult: Secondary | ICD-10-CM | POA: Diagnosis not present

## 2023-02-01 DIAGNOSIS — N401 Enlarged prostate with lower urinary tract symptoms: Secondary | ICD-10-CM | POA: Diagnosis not present

## 2023-02-01 DIAGNOSIS — N481 Balanitis: Secondary | ICD-10-CM | POA: Diagnosis not present

## 2023-02-01 DIAGNOSIS — Z87442 Personal history of urinary calculi: Secondary | ICD-10-CM | POA: Diagnosis not present

## 2023-02-01 DIAGNOSIS — N138 Other obstructive and reflux uropathy: Secondary | ICD-10-CM | POA: Diagnosis not present

## 2023-02-01 DIAGNOSIS — R030 Elevated blood-pressure reading, without diagnosis of hypertension: Secondary | ICD-10-CM | POA: Diagnosis not present

## 2023-02-03 DIAGNOSIS — Z8572 Personal history of non-Hodgkin lymphomas: Secondary | ICD-10-CM | POA: Diagnosis not present

## 2023-02-03 DIAGNOSIS — N200 Calculus of kidney: Secondary | ICD-10-CM | POA: Diagnosis not present

## 2023-02-03 DIAGNOSIS — N2889 Other specified disorders of kidney and ureter: Secondary | ICD-10-CM | POA: Diagnosis not present

## 2023-02-09 ENCOUNTER — Other Ambulatory Visit: Payer: Self-pay

## 2023-02-09 MED ORDER — FREESTYLE LIBRE 3 PLUS SENSOR MISC: 1 refills | Status: DC

## 2023-02-15 ENCOUNTER — Telehealth: Payer: Self-pay | Admitting: "Endocrinology

## 2023-02-15 MED ORDER — FREESTYLE LIBRE 3 PLUS SENSOR MISC: 1 refills | Status: DC

## 2023-02-15 NOTE — Telephone Encounter (Signed)
Rx sent 

## 2023-02-15 NOTE — Telephone Encounter (Signed)
Pt is asking for a refill to be sent for his sensors. It looks like it was sent on 11/27 but pt needs it resent

## 2023-02-23 DIAGNOSIS — Z955 Presence of coronary angioplasty implant and graft: Secondary | ICD-10-CM | POA: Diagnosis not present

## 2023-02-23 DIAGNOSIS — N1831 Chronic kidney disease, stage 3a: Secondary | ICD-10-CM | POA: Diagnosis not present

## 2023-02-23 DIAGNOSIS — E1165 Type 2 diabetes mellitus with hyperglycemia: Secondary | ICD-10-CM | POA: Diagnosis not present

## 2023-03-03 DIAGNOSIS — C8330 Diffuse large B-cell lymphoma, unspecified site: Secondary | ICD-10-CM | POA: Diagnosis not present

## 2023-03-03 DIAGNOSIS — I5032 Chronic diastolic (congestive) heart failure: Secondary | ICD-10-CM | POA: Diagnosis not present

## 2023-03-03 DIAGNOSIS — D696 Thrombocytopenia, unspecified: Secondary | ICD-10-CM | POA: Diagnosis not present

## 2023-03-03 DIAGNOSIS — E7849 Other hyperlipidemia: Secondary | ICD-10-CM | POA: Diagnosis not present

## 2023-03-03 DIAGNOSIS — Z1389 Encounter for screening for other disorder: Secondary | ICD-10-CM | POA: Diagnosis not present

## 2023-03-03 DIAGNOSIS — Z1331 Encounter for screening for depression: Secondary | ICD-10-CM | POA: Diagnosis not present

## 2023-03-03 DIAGNOSIS — R030 Elevated blood-pressure reading, without diagnosis of hypertension: Secondary | ICD-10-CM | POA: Diagnosis not present

## 2023-03-03 DIAGNOSIS — Z6833 Body mass index (BMI) 33.0-33.9, adult: Secondary | ICD-10-CM | POA: Diagnosis not present

## 2023-03-03 DIAGNOSIS — E1122 Type 2 diabetes mellitus with diabetic chronic kidney disease: Secondary | ICD-10-CM | POA: Diagnosis not present

## 2023-03-03 DIAGNOSIS — I69851 Hemiplegia and hemiparesis following other cerebrovascular disease affecting right dominant side: Secondary | ICD-10-CM | POA: Diagnosis not present

## 2023-03-03 DIAGNOSIS — R911 Solitary pulmonary nodule: Secondary | ICD-10-CM | POA: Diagnosis not present

## 2023-03-03 DIAGNOSIS — N401 Enlarged prostate with lower urinary tract symptoms: Secondary | ICD-10-CM | POA: Diagnosis not present

## 2023-03-25 ENCOUNTER — Other Ambulatory Visit: Payer: Self-pay | Admitting: "Endocrinology

## 2023-04-07 ENCOUNTER — Other Ambulatory Visit: Payer: Self-pay | Admitting: Cardiology

## 2023-04-07 ENCOUNTER — Other Ambulatory Visit: Payer: Self-pay | Admitting: "Endocrinology

## 2023-04-15 ENCOUNTER — Other Ambulatory Visit: Payer: Self-pay | Admitting: Hematology and Oncology

## 2023-04-15 ENCOUNTER — Inpatient Hospital Stay (HOSPITAL_BASED_OUTPATIENT_CLINIC_OR_DEPARTMENT_OTHER): Payer: Medicare Other | Admitting: Hematology and Oncology

## 2023-04-15 ENCOUNTER — Inpatient Hospital Stay: Payer: Medicare Other | Attending: Hematology and Oncology

## 2023-04-15 VITALS — BP 126/67 | HR 82 | Temp 97.6°F | Resp 18 | Wt 244.4 lb

## 2023-04-15 DIAGNOSIS — Z87891 Personal history of nicotine dependence: Secondary | ICD-10-CM | POA: Diagnosis not present

## 2023-04-15 DIAGNOSIS — C8338 Diffuse large B-cell lymphoma, lymph nodes of multiple sites: Secondary | ICD-10-CM

## 2023-04-15 DIAGNOSIS — I700 Atherosclerosis of aorta: Secondary | ICD-10-CM | POA: Diagnosis not present

## 2023-04-15 DIAGNOSIS — C8335 Diffuse large B-cell lymphoma, lymph nodes of inguinal region and lower limb: Secondary | ICD-10-CM | POA: Diagnosis not present

## 2023-04-15 DIAGNOSIS — I10 Essential (primary) hypertension: Secondary | ICD-10-CM | POA: Diagnosis not present

## 2023-04-15 DIAGNOSIS — E1122 Type 2 diabetes mellitus with diabetic chronic kidney disease: Secondary | ICD-10-CM | POA: Diagnosis not present

## 2023-04-15 DIAGNOSIS — D509 Iron deficiency anemia, unspecified: Secondary | ICD-10-CM | POA: Insufficient documentation

## 2023-04-15 LAB — CMP (CANCER CENTER ONLY)
ALT: 30 U/L (ref 0–44)
AST: 22 U/L (ref 15–41)
Albumin: 4 g/dL (ref 3.5–5.0)
Alkaline Phosphatase: 56 U/L (ref 38–126)
Anion gap: 7 (ref 5–15)
BUN: 19 mg/dL (ref 8–23)
CO2: 24 mmol/L (ref 22–32)
Calcium: 9.4 mg/dL (ref 8.9–10.3)
Chloride: 109 mmol/L (ref 98–111)
Creatinine: 1.05 mg/dL (ref 0.61–1.24)
GFR, Estimated: >60 mL/min (ref >60)
Glucose, Bld: 193 mg/dL — ABNORMAL HIGH (ref 70–99)
Potassium: 4.3 mmol/L (ref 3.5–5.1)
Sodium: 140 mmol/L (ref 135–145)
Total Bilirubin: 0.6 mg/dL (ref 0.0–1.2)
Total Protein: 7.5 g/dL (ref 6.5–8.1)

## 2023-04-15 LAB — CBC WITH DIFFERENTIAL (CANCER CENTER ONLY)
Abs Immature Granulocytes: 0.02 10*3/uL (ref 0.00–0.07)
Basophils Absolute: 0.1 10*3/uL (ref 0.0–0.1)
Basophils Relative: 1 %
Eosinophils Absolute: 0.7 10*3/uL — ABNORMAL HIGH (ref 0.0–0.5)
Eosinophils Relative: 10 %
HCT: 39.1 % (ref 39.0–52.0)
Hemoglobin: 12.6 g/dL — ABNORMAL LOW (ref 13.0–17.0)
Immature Granulocytes: 0 %
Lymphocytes Relative: 25 %
Lymphs Abs: 1.7 10*3/uL (ref 0.7–4.0)
MCH: 25.7 pg — ABNORMAL LOW (ref 26.0–34.0)
MCHC: 32.2 g/dL (ref 30.0–36.0)
MCV: 79.6 fL — ABNORMAL LOW (ref 80.0–100.0)
Monocytes Absolute: 0.5 10*3/uL (ref 0.1–1.0)
Monocytes Relative: 7 %
Neutro Abs: 3.8 10*3/uL (ref 1.7–7.7)
Neutrophils Relative %: 57 %
Platelet Count: 228 10*3/uL (ref 150–400)
RBC: 4.91 MIL/uL (ref 4.22–5.81)
RDW: 14.6 % (ref 11.5–15.5)
WBC Count: 6.8 10*3/uL (ref 4.0–10.5)
nRBC: 0 % (ref 0.0–0.2)

## 2023-04-15 LAB — LACTATE DEHYDROGENASE: LDH: 363 U/L — ABNORMAL HIGH (ref 98–192)

## 2023-04-15 NOTE — Progress Notes (Signed)
[facility] [phone]   Fax:([phone]  PROGRESS NOTE  Patient Care Team: Juliette Alcide, MD as PCP - General Branch, Dorothe Pea, MD as PCP - Cardiology (Cardiology) Roma Kayser, MD as Consulting Physician (Endocrinology)  Hematological/Oncological History # Diffuse Large B Cell Lymphoma, Stage III 03/16/2022: establish care with Dr. Leonides Schanz [facility] 04/14/2022: excisional lymph node biopsy of right inguinal node shows DLBCL arising from follicular lymphoma 04/21/2022: PET CT scan showed widespread hypermetabolic lymphadenopathy in the neck, chest, abdomen, and pelvis 05/06/2021: Defer Cycle 1 Day 1 of R-CHOP chemotherapy. Patient has wound dehiscence and possible infection.  Seen by surgery same day. 05/14/2022: Cycle 1 Day 1 of R-CHOP chemotherapy 06/03/2022: Cycle 2 Day 1 of R-CHOP chemotherapy 06/25/2022: Cycle 3 Day 1 of R-CHOP chemotherapy 07/16/2022: Cycle 4, Day 1 of R-CHOP chemotherapy HELD due to healing right groin abscess 07/22/2022: PET CT Scan shows interval response to therapy with residual borderline to minimally hypermetabolic adenopathy in the abdomen and pelvis 07/23/2022: Cycle 4, Day 1 of R-CHOP 08/13/2022: Cycle 5, Day 1 of R-CHOP 09/03/2022: Cycle 6, Day 1 of R-CHOP 10/25/2022: PET CT Scan shows Deauville 3, no evidence of residual/recurrent disease.   Interval History:  Alex Cortez 73 y.o. male with medical history significant for newly diagnosed diffuse large B-cell lymphoma who presents for a follow up visit. The patient's last visit was on 01/06/2023. In the interim since the last visit he has had no major changes in his health.  On exam today Alex Cortez reports he has been well overall with interim since our last visit.  He has his mother and sister with him today.  He reports his energy levels are good and his appetite is strong, but he has been losing weight.  He thinks some this might be fluid weight as he  is getting improved swelling in his lower extremities.  He reports that the wound on his leg from the prior incision site is also well-healed.  There is no drainage, tenderness, or discharge.  He reports that he does have some occasional numbness in his fingers.  He reports his taste buds also have not come back to normal yet.  He reports that he has had no cough or shortness of breath or other infectious symptoms such as runny nose or sore throat.  He does have an upcoming colonoscopy and is aware of his upcoming CT scan in February 2025.  He reports otherwise he has been well and has no questions concerns or complaints today.  He denies fevers, chills, sweats, chest pain or cough. He has no other complaints. Rest of the 10 point ROS is below.    MEDICAL HISTORY:  Past Medical History:  Diagnosis Date   CAD (coronary artery disease)    nonobstructive CAD/patent LAD stent site; EF 65%, 08/2011; NSTEMI/DES LAD, 6/20 11   Chest pain    Diabetes mellitus    insulin-dependent   Dyslipidemia    HTN (hypertension)    Hypothyroidism    Myocardial infarction [facility]) 2011   Post MI syndrome (HCC) 08/29/2009   Stroke (HCC) 10/22/2012   Left inferior pons    SURGICAL HISTORY: Past Surgical History:  Procedure Laterality Date   COLONOSCOPY  08/2021   CORONARY ANGIOPLASTY WITH STENT PLACEMENT  2011   IR IMAGING GUIDED PORT INSERTION  04/26/2022   IR REMOVAL TUN ACCESS W/ PORT W/O FL MOD SED  01/20/2023   LYMPH NODE BIOPSY Right 04/14/2022  Procedure: RIGHT INGUINAL EXCISIONAL LYMPH NODE BIOPSY;  Surgeon: Fritzi Mandes, MD;  Location: [facility];  Service: General;  Laterality: Right;    SOCIAL HISTORY: Social History   Socioeconomic History   Marital status: Married    Spouse name: Not on file   Number of children: Not on file   Years of education: Not on file   Highest education level: Not on file  Occupational History   Not on file  Tobacco Use   Smoking status: Former    Current packs/day:  0.00    Average packs/day: 0.5 packs/day for 33.0 years (16.5 ttl pk-yrs)    Types: Cigarettes    Start date: 03/16/1955    Quit date: 03/15/1988    Years since quitting: 35.1   Smokeless tobacco: Never  Vaping Use   Vaping status: Never Used  Substance and Sexual Activity   Alcohol use: No    Alcohol/week: 0.0 standard drinks of alcohol   Drug use: No   Sexual activity: Not on file  Other Topics Concern   Not on file  Social History Narrative   Works at [employer]. Married.    Social Drivers of Corporate investment banker Strain: Low Risk  (01/03/2023)   Overall Financial Resource Strain (CARDIA)    Difficulty of Paying Living Expenses: Not very hard  Food Insecurity: No Food Insecurity (11/17/2022)   Hunger Vital Sign    Worried About Running Out of Food in the Last Year: Never true    Ran Out of Food in the Last Year: Never true  Transportation Needs: No Transportation Needs (01/03/2023)   PRAPARE - Administrator, Civil Service (Medical): No    Lack of Transportation (Non-Medical): No  Physical Activity: Inactive (11/22/2022)   Exercise Vital Sign    Days of Exercise per Week: 0 days    Minutes of Exercise per Session: 0 min  Stress: No Stress Concern Present (04/07/2022)   [employer] of Occupational Health - Occupational Stress Questionnaire    Feeling of Stress : Not at all  Social Connections: Unknown (07/31/2017)   Social Connection and Isolation Panel [NHANES]    Frequency of Communication with Friends and Family: Patient declined    Frequency of Social Gatherings with Friends and Family: Patient declined    Attends Religious Services: Patient declined    Database administrator or Organizations: Patient declined    Attends Banker Meetings: Patient declined    Marital Status: Patient declined  Intimate Partner Violence: Not At Risk (11/17/2022)   Humiliation, Afraid, Rape, and Kick questionnaire    Fear of Current or Ex-Partner: No     Emotionally Abused: No    Physically Abused: No    Sexually Abused: No    FAMILY HISTORY: Family History  Problem Relation Age of Onset   Colon cancer Mother    Hypertension Other        in all family members    ALLERGIES:  is allergic to ace inhibitors, other, rituxan [rituximab], and shrimp [shellfish allergy].  MEDICATIONS:  Current Outpatient Medications  Medication Sig Dispense Refill   allopurinol (ZYLOPRIM) 300 MG tablet TAKE 1 TABLET BY MOUTH EVERY DAY 90 tablet 1   carvedilol (COREG) 12.5 MG tablet Take 1 tablet (12.5 mg total) by mouth 2 (two) times daily with a meal. 60 tablet 0   clopidogrel (PLAVIX) 75 MG tablet Take 1 tablet (75 mg total) by mouth daily. 30 tablet 5   Continuous  Glucose Receiver (FREESTYLE LIBRE 3 READER) DEVI 1 Piece by Does not apply route once as needed for up to 1 dose. 1 each 0   Continuous Glucose Sensor (FREESTYLE LIBRE 3 PLUS SENSOR) MISC Change sensor every 15 days. E11.65 6 each 1   Continuous Glucose Sensor (FREESTYLE LIBRE 3 SENSOR) MISC 1 Piece by Does not apply route every 14 (fourteen) days. Place 1 sensor on the skin every 14 days. Use to check glucose continuously 2 each 2   dapagliflozin propanediol (FARXIGA) 10 MG TABS tablet Take 1 tablet (10 mg total) by mouth daily with breakfast. 90 tablet 0   dapagliflozin propanediol (FARXIGA) 10 MG TABS tablet Take 1 tablet (10 mg total) by mouth daily before breakfast. 90 tablet 0   ezetimibe (ZETIA) 10 MG tablet Take 1 tablet (10 mg total) by mouth daily. 90 tablet 2   insulin regular human CONCENTRATED (HUMULIN R U-500 KWIKPEN) 500 UNIT/ML KwikPen INJECT AS DIRECTED 60 UNITS WITH BREAKFAST, 60 UNITS WITH LUNCH, AND 60 UNITS WITH SUPPER FOR PRE-MEAL BLOOD GLUCOSE READINGS OF 90MG /DL OR ABOVE 36 mL 0   isosorbide mononitrate (IMDUR) 30 MG 24 hr tablet TAKE 1 TABLET BY MOUTH EVERY DAY 90 tablet 1   levothyroxine (SYNTHROID) 75 MCG tablet TAKE 1 TABLET BY MOUTH EVERY DAY BEFORE BREAKFAST 90 tablet 0    lidocaine-prilocaine (EMLA) cream Apply 1 Application topically as needed. (Patient taking differently: Apply 1 Application topically daily as needed (port access).) 30 g 0   metFORMIN (GLUCOPHAGE) 500 MG tablet TAKE 1 TABLET BY MOUTH EVERY DAY WITH BREAKFAST 90 tablet 0   nitroGLYCERIN (NITROSTAT) 0.4 MG SL tablet Place 1 tablet (0.4 mg total) under the tongue every 5 (five) minutes as needed. 25 tablet 3   ONE TOUCH ULTRA TEST test strip USE TO TEST BLOOD SUGAR FOUR TIMES DAILY E11.65 150 each 5   rosuvastatin (CRESTOR) 40 MG tablet TAKE 1 TABLET BY MOUTH EVERY DAY 90 tablet 2   No current facility-administered medications for this visit.    REVIEW OF SYSTEMS:   Constitutional: ( - ) fevers, ( - )  chills , ( - ) night sweats Eyes: ( - ) blurriness of vision, ( - ) double vision, ( - ) watery eyes Ears, nose, mouth, throat, and face: ( - ) mucositis, ( - ) sore throat Respiratory: ( - ) cough, ( + ) dyspnea, ( - ) wheezes Cardiovascular: ( - ) palpitation, ( - ) chest discomfort, ( - ) lower extremity swelling Gastrointestinal:  ( - ) nausea, ( - ) heartburn, ( - ) change in bowel habits Skin: ( - ) abnormal skin rashes Lymphatics: ( - ) new lymphadenopathy, ( - ) easy bruising Neurological: ( + ) numbness, ( - ) tingling, ( - ) new weaknesses Behavioral/Psych: ( - ) mood change, ( - ) new changes  All other systems were reviewed with the patient and are negative.  PHYSICAL EXAMINATION: ECOG PERFORMANCE STATUS: 1 - Symptomatic but completely ambulatory  Vitals:   04/15/23 1349  BP: 126/67  Pulse: 82  Resp: 18  Temp: 97.6 F (36.4 C)  SpO2: 100%       Filed Weights   04/15/23 1349  Weight: 244 lb 6.4 oz (110.9 kg)        Day 1, Cycle 4 07/23/22  Height 6\' 3"  (1.905 m)  Weight 276 lb 12.8 oz (125.6 kg)  BSA (Calculated - sq m) 2.58 sq meters  Temp 98.2 F (36.8 C)  Temp src Oral  Pulse 77  Resp 20  BP 109/57 !     GENERAL: Well-appearing elderly  African-American male alert, no distress and comfortable SKIN: skin color, texture, turgor are normal, no rashes or significant lesions. EYES: conjunctiva are pink and non-injected, sclera clear LUNGS: clear to auscultation and percussion with normal breathing effort HEART: regular rate & rhythm and no murmurs and no lower extremity edema Musculoskeletal: no cyanosis of digits and no clubbing  PSYCH: alert & oriented x 3, fluent speech NEURO: no focal motor/sensory deficits  LABORATORY DATA:  I have reviewed the data as listed    Latest Ref Rng & Units 04/15/2023    1:20 PM 01/06/2023    2:27 PM 11/18/2022    4:14 AM  CBC  WBC 4.0 - 10.5 K/uL 6.8  6.0  11.5   Hemoglobin 13.0 - 17.0 g/dL 16.1  09.6  04.5   Hematocrit 39.0 - 52.0 % 39.1  39.1  33.4   Platelets 150 - 400 K/uL 228  267  158        Latest Ref Rng & Units 04/15/2023    1:20 PM 01/06/2023    2:27 PM 11/18/2022    4:14 AM  CMP  Glucose 70 - 99 mg/dL 409  811  53   BUN 8 - 23 mg/dL 19  22  20    Creatinine 0.61 - 1.24 mg/dL 9.14  7.82  9.56   Sodium 135 - 145 mmol/L 140  142  137   Potassium 3.5 - 5.1 mmol/L 4.3  4.0  3.4   Chloride 98 - 111 mmol/L 109  107  105   CO2 22 - 32 mmol/L 24  26  20    Calcium 8.9 - 10.3 mg/dL 9.4  9.7  8.3   Total Protein 6.5 - 8.1 g/dL 7.5  7.6    Total Bilirubin 0.0 - 1.2 mg/dL 0.6  0.6    Alkaline Phos 38 - 126 U/L 56  56    AST 15 - 41 U/L 22  17    ALT 0 - 44 U/L 30  26      RADIOGRAPHIC STUDIES: I have personally reviewed the radiological images as listed and agreed with the findings in the report: Diffuse involving the lymph nodes in the neck, underarms, chest, abdomen and groin. No results found.  ASSESSMENT & PLAN Alex Cortez 73 y.o. male with medical history significant for newly diagnosed diffuse large B-cell lymphoma who presents for a follow up visit.  At this time findings are most consistent with diffuse large B-cell lymphoma stage III.  The difference tween stage III  and stage IV is academic as there is no difference in treatment.  We will plan to proceed with 6 cycles of R-CHOP chemotherapy.  Chemotherapy will be administered every 3 weeks with G-CSF therapy to be provided on day 3.  Patient will be administered chemotherapy of full-strength.  His echocardiogram shows good baseline cardiac function.  Also his labs are strong at baseline.  The patient voices understanding of the chemotherapy and the plan moving forward.  # Diffuse Large B Cell Lymphoma Stage III/IV -- At this time findings are most consistent with a diffuse large B-cell lymphoma arising from follicular lymphoma, stage III/IV.  Would favor stage 3 as there is no clear evidence of organ involvement, may have bone marrow involvement. -- Will proceed with R-CHOP chemotherapy.  Discussed the risks and benefits of this therapy as well as the anticipated side  effects and scheduling. -- Cycle 1 Day 1 of R-CHOP chemotherapy on 05/14/2022 Plan: -- patient completed 6 cycles of R-CHOP chemotherapy.  --Labs today show white blood cell count 6.8, Hgb 12.6, MCV 79.6, Plt 228. Creatinine and LFTs normal.  --post treatment PET scan performed on 10/25/2022 showed response to therapy. Repeat CT scan due in Feb 2025.    --RTC in 3 months  #Microcytic anemia: --Labs today show Hgb 12.6, improving --Iron panel checked on 05/12/2022 without deficiency.   # Wound Dehiscence/Wound Infection--recurrence --Completed antibiotic therapy with Bactrim. --Was evaluated by surgical team on 05/10/2022 with improvement of wound. They gave clearance to start chemotherapy scheduled for 05/14/2022.  --Readmitted 07/11/2022-07/14/2022 right groin cellulitis/abscess. He underwent I&D on 4/29 with culture that demonstrated growth of staph aureus that was pansensitivity. He was transition to oral antibiotics for 7 days with bactrim at discharge. --Had follow up with Dr. Freida Busman on 07/22/2022. Gave clearance to resume chemotherapy. Patient was  advised to gently pack the opening with a small piece of gauze for about 3 days and then he no longer needs to pack it with hopes that it will close up quickly.   #Supportive Care -- port removed  No orders of the defined types were placed in this encounter.  All questions were answered. The patient knows to call the clinic with any problems, questions or concerns.  A total of more than 30 minutes were spent on this encounter with face-to-face time and non-face-to-face time, including preparing to see the patient, ordering tests and/or medications, counseling the patient and coordination of care as outlined above.   Ulysees Barns, MD Department of Hematology/Oncology [facility] at [facility] Phone: [phone] Pager: [phone] Email: [email or web address]  04/15/2023 2:17 PM

## 2023-04-16 ENCOUNTER — Other Ambulatory Visit: Payer: Self-pay

## 2023-04-18 ENCOUNTER — Other Ambulatory Visit: Payer: Self-pay

## 2023-04-19 ENCOUNTER — Encounter: Payer: Self-pay | Admitting: "Endocrinology

## 2023-04-19 ENCOUNTER — Ambulatory Visit (INDEPENDENT_AMBULATORY_CARE_PROVIDER_SITE_OTHER): Payer: Medicare Other | Admitting: "Endocrinology

## 2023-04-19 VITALS — BP 122/56 | HR 72 | Ht 75.0 in | Wt 248.0 lb

## 2023-04-19 DIAGNOSIS — E039 Hypothyroidism, unspecified: Secondary | ICD-10-CM | POA: Diagnosis not present

## 2023-04-19 DIAGNOSIS — E782 Mixed hyperlipidemia: Secondary | ICD-10-CM

## 2023-04-19 DIAGNOSIS — E6609 Other obesity due to excess calories: Secondary | ICD-10-CM | POA: Diagnosis not present

## 2023-04-19 DIAGNOSIS — Z794 Long term (current) use of insulin: Secondary | ICD-10-CM

## 2023-04-19 DIAGNOSIS — I10 Essential (primary) hypertension: Secondary | ICD-10-CM

## 2023-04-19 DIAGNOSIS — E1159 Type 2 diabetes mellitus with other circulatory complications: Secondary | ICD-10-CM

## 2023-04-19 DIAGNOSIS — Z6832 Body mass index (BMI) 32.0-32.9, adult: Secondary | ICD-10-CM | POA: Diagnosis not present

## 2023-04-19 DIAGNOSIS — E66811 Obesity, class 1: Secondary | ICD-10-CM

## 2023-04-19 LAB — POCT GLYCOSYLATED HEMOGLOBIN (HGB A1C): HbA1c, POC (controlled diabetic range): 8.7 % — AB (ref 0.0–7.0)

## 2023-04-19 MED ORDER — HUMULIN R U-500 KWIKPEN 500 UNIT/ML ~~LOC~~ SOPN: PEN_INJECTOR | SUBCUTANEOUS | 0 refills | Status: DC

## 2023-04-19 MED ORDER — FREESTYLE LIBRE 3 PLUS SENSOR MISC: 1 refills | Status: DC

## 2023-04-19 NOTE — Progress Notes (Signed)
 04/19/2023  Endocrinology follow-up note   Subjective:    Patient ID: Alex Cortez, male    DOB: Mar 10, 1951, PCP Lari Elspeth BRAVO, MD   Past Medical History:  Diagnosis Date   CAD (coronary artery disease)    nonobstructive CAD/patent LAD stent site; EF 65%, 08/2011; NSTEMI/DES LAD, 6/20 11   Chest pain    Diabetes mellitus    insulin -dependent   Dyslipidemia    HTN (hypertension)    Hypothyroidism    Myocardial infarction (HCC) 2011   Post MI syndrome (HCC) 08/29/2009   Stroke (HCC) 10/22/2012   Left inferior pons   Past Surgical History:  Procedure Laterality Date   COLONOSCOPY  08/2021   CORONARY ANGIOPLASTY WITH STENT PLACEMENT  2011   IR IMAGING GUIDED PORT INSERTION  04/26/2022   IR REMOVAL TUN ACCESS W/ PORT W/O FL MOD SED  01/20/2023   LYMPH NODE BIOPSY Right 04/14/2022   Procedure: RIGHT INGUINAL EXCISIONAL LYMPH NODE BIOPSY;  Surgeon: Dasie Leonor CROME, MD;  Location: [facility];  Service: General;  Laterality: Right;   Social History   Socioeconomic History   Marital status: Married    Spouse name: Not on file   Number of children: Not on file   Years of education: Not on file   Highest education level: Not on file  Occupational History   Not on file  Tobacco Use   Smoking status: Former    Current packs/day: 0.00    Average packs/day: 0.5 packs/day for 33.0 years (16.5 ttl pk-yrs)    Types: Cigarettes    Start date: 03/16/1955    Quit date: 03/15/1988    Years since quitting: 35.1   Smokeless tobacco: Never  Vaping Use   Vaping status: Never Used  Substance and Sexual Activity   Alcohol  use: No    Alcohol /week: 0.0 standard drinks of alcohol    Drug use: No   Sexual activity: Not on file  Other Topics Concern   Not on file  Social History Narrative   Works at [employer]. Married.    Social Drivers of Corporate Investment Banker Strain: Low Risk  (01/03/2023)   Overall Financial Resource Strain (CARDIA)    Difficulty of Paying Living Expenses: Not  very hard  Food Insecurity: No Food Insecurity (11/17/2022)   Hunger Vital Sign    Worried About Running Out of Food in the Last Year: Never true    Ran Out of Food in the Last Year: Never true  Transportation Needs: No Transportation Needs (01/03/2023)   PRAPARE - Administrator, Civil Service (Medical): No    Lack of Transportation (Non-Medical): No  Physical Activity: Inactive (11/22/2022)   Exercise Vital Sign    Days of Exercise per Week: 0 days    Minutes of Exercise per Session: 0 min  Stress: No Stress Concern Present (04/07/2022)   [employer] of Occupational Health - Occupational Stress Questionnaire    Feeling of Stress : Not at all  Social Connections: Unknown (07/31/2017)   Social Connection and Isolation Panel [NHANES]    Frequency of Communication with Friends and Family: Patient declined    Frequency of Social Gatherings with Friends and Family: Patient declined    Attends Religious Services: Patient declined    Active Member of Clubs or Organizations: Patient declined    Attends Banker Meetings: Patient declined    Marital Status: Patient declined   Outpatient Encounter Medications as of 04/19/2023  Medication Sig  allopurinol  (ZYLOPRIM ) 300 MG tablet TAKE 1 TABLET BY MOUTH EVERY DAY   carvedilol  (COREG ) 12.5 MG tablet Take 1 tablet (12.5 mg total) by mouth 2 (two) times daily with a meal.   clopidogrel  (PLAVIX ) 75 MG tablet Take 1 tablet (75 mg total) by mouth daily.   Continuous Glucose Receiver (FREESTYLE LIBRE 3 READER) DEVI 1 Piece by Does not apply route once as needed for up to 1 dose.   Continuous Glucose Sensor (FREESTYLE LIBRE 3 PLUS SENSOR) MISC Change sensor every 15 days. E11.65   dapagliflozin  propanediol (FARXIGA ) 10 MG TABS tablet Take 1 tablet (10 mg total) by mouth daily with breakfast.   dapagliflozin  propanediol (FARXIGA ) 10 MG TABS tablet Take 1 tablet (10 mg total) by mouth daily before breakfast.   ezetimibe  (ZETIA ) 10  MG tablet Take 1 tablet (10 mg total) by mouth daily.   insulin  regular human CONCENTRATED (HUMULIN  R U-500 KWIKPEN) 500 UNIT/ML KwikPen INJECT AS DIRECTED 60 UNITS WITH BREAKFAST, 60 UNITS WITH LUNCH, AND 60 UNITS WITH SUPPER FOR PRE-MEAL BLOOD GLUCOSE READINGS OF 90MG /DL OR ABOVE   isosorbide  mononitrate (IMDUR ) 30 MG 24 hr tablet TAKE 1 TABLET BY MOUTH EVERY DAY   levothyroxine  (SYNTHROID ) 75 MCG tablet TAKE 1 TABLET BY MOUTH EVERY DAY BEFORE BREAKFAST   lidocaine -prilocaine  (EMLA ) cream Apply 1 Application topically as needed. (Patient taking differently: Apply 1 Application topically daily as needed (port access).)   metFORMIN  (GLUCOPHAGE ) 500 MG tablet TAKE 1 TABLET BY MOUTH EVERY DAY WITH BREAKFAST   nitroGLYCERIN  (NITROSTAT ) 0.4 MG SL tablet Place 1 tablet (0.4 mg total) under the tongue every 5 (five) minutes as needed.   ONE TOUCH ULTRA TEST test strip USE TO TEST BLOOD SUGAR FOUR TIMES DAILY E11.65   rosuvastatin  (CRESTOR ) 40 MG tablet TAKE 1 TABLET BY MOUTH EVERY DAY   [DISCONTINUED] Continuous Glucose Sensor (FREESTYLE LIBRE 3 PLUS SENSOR) MISC Change sensor every 15 days. E11.65   [DISCONTINUED] Continuous Glucose Sensor (FREESTYLE LIBRE 3 SENSOR) MISC 1 Piece by Does not apply route every 14 (fourteen) days. Place 1 sensor on the skin every 14 days. Use to check glucose continuously   [DISCONTINUED] insulin  regular human CONCENTRATED (HUMULIN  R U-500 KWIKPEN) 500 UNIT/ML KwikPen INJECT AS DIRECTED 60 UNITS WITH BREAKFAST, 60 UNITS WITH LUNCH, AND 60 UNITS WITH SUPPER FOR PRE-MEAL BLOOD GLUCOSE READINGS OF 90MG /DL OR ABOVE   No facility-administered encounter medications on file as of 04/19/2023.   ALLERGIES: Allergies  Allergen Reactions   Ace Inhibitors Swelling    Angioedema   Other Swelling    Shellfish   Swelling and hives  Shellfish  Swelling and hives   Rituxan  [Rituximab ] Other (See Comments)    Pt had sensitivity reaction to Rituxan . See progress note from 05/14/22. Pt  able to complete infusion.    Shrimp [Shellfish Allergy] Hives and Swelling   VACCINATION STATUS: Immunization History  Administered Date(s) Administered   Influenza, High Dose Seasonal PF 12/31/2016   Moderna Sars-Covid-2 Vaccination 04/19/2019, 05/17/2019     Diabetes He presents for his follow-up diabetic visit. He has type 2 diabetes mellitus. Onset time: He was diagnosed at approximate age of 52 years. His disease course has been improving. There are no hypoglycemic associated symptoms. Pertinent negatives for hypoglycemia include no confusion, headaches, pallor or seizures. Pertinent negatives for diabetes include no chest pain, no fatigue, no polydipsia, no polyphagia, no polyuria and no weakness. There are no hypoglycemic complications. Symptoms are improving. Diabetic complications include a CVA, heart disease and  nephropathy. Risk factors for coronary artery disease include diabetes mellitus, dyslipidemia, hypertension, male sex, obesity, sedentary lifestyle and tobacco exposure. Current diabetic treatment includes intensive insulin  program. He is compliant with treatment most of the time. His weight is fluctuating minimally. He is following a generally unhealthy diet. When asked about meal planning, he reported none. He has had a previous visit with a dietitian. He never participates in exercise. His home blood glucose trend is fluctuating minimally. His breakfast blood glucose range is generally 130-140 mg/dl. His lunch blood glucose range is generally 140-180 mg/dl. His dinner blood glucose range is generally 140-180 mg/dl. His bedtime blood glucose range is generally 140-180 mg/dl. His overall blood glucose range is 140-180 mg/dl. (Mr. Savoca presents with his CGM device.  While acknowledging that he did run higher blood glucose readings for most of the interval, he recently did better.  He is CGM AGP was analyzed showing 61% time range, 33% level 1 hyperglycemia, 4% level 2 hyperglycemia.   His point-of-care A1c is 8.7% however his average blood glucose is 161 mg per DL for the last 14 days.  He did not have significant hypoglycemia.     ) An ACE inhibitor/angiotensin II receptor blocker is being taken. Eye exam is current.  Hyperlipidemia This is a chronic problem. The current episode started more than 1 year ago. The problem is controlled. Exacerbating diseases include chronic renal disease, diabetes and obesity. Pertinent negatives include no chest pain, leg pain, myalgias or shortness of breath. Current antihyperlipidemic treatment includes statins. Risk factors for coronary artery disease include diabetes mellitus, dyslipidemia, hypertension, male sex, obesity and a sedentary lifestyle.  Hypertension This is a chronic problem. The current episode started more than 1 year ago. The problem is controlled. Pertinent negatives include no chest pain, headaches, neck pain, palpitations or shortness of breath. Risk factors for coronary artery disease include obesity, male gender, dyslipidemia, diabetes mellitus, sedentary lifestyle and family history. Past treatments include ACE inhibitors. Hypertensive end-organ damage includes kidney disease and CVA. Identifiable causes of hypertension include chronic renal disease.     Review of systems  Constitutional: + Gaining weight,  current  Body mass index is 31 kg/m. , no fatigue, no subjective hyperthermia, no subjective hypothermia    Objective:    BP (!) 122/56   Pulse 72   Ht 6' 3 (1.905 m)   Wt 248 lb (112.5 kg)   BMI 31.00 kg/m   Wt Readings from Last 3 Encounters:  04/19/23 248 lb (112.5 kg)  04/15/23 244 lb 6.4 oz (110.9 kg)  01/06/23 253 lb 4.8 oz (114.9 kg)      Physical Exam- Limited  Constitutional:  Body mass index is 31 kg/m. , not in acute distress, normal state of mind Eyes:  EOMI, no exophthalmos Neck: Supple        Latest Ref Rng & Units 04/15/2023    1:20 PM 01/06/2023    2:27 PM 11/18/2022    4:14  AM  CMP  Glucose 70 - 99 mg/dL 806  800  53   BUN 8 - 23 mg/dL 19  22  20    Creatinine 0.61 - 1.24 mg/dL 8.94  8.85  8.90   Sodium 135 - 145 mmol/L 140  142  137   Potassium 3.5 - 5.1 mmol/L 4.3  4.0  3.4   Chloride 98 - 111 mmol/L 109  107  105   CO2 22 - 32 mmol/L 24  26  20    Calcium  8.9 -  10.3 mg/dL 9.4  9.7  8.3   Total Protein 6.5 - 8.1 g/dL 7.5  7.6    Total Bilirubin 0.0 - 1.2 mg/dL 0.6  0.6    Alkaline Phos 38 - 126 U/L 56  56    AST 15 - 41 U/L 22  17    ALT 0 - 44 U/L 30  26       Lipid Panel     Component Value Date/Time   CHOL 135 12/26/2021 0000   CHOL 145 09/11/2021 0810   TRIG 88 12/26/2021 0000   HDL 35 12/26/2021 0000   HDL 35 (L) 09/11/2021 0810   CHOLHDL 4.1 09/11/2021 0810   CHOLHDL 4.5 08/01/2017 0243   VLDL 20 08/01/2017 0243   LDLCALC 82 12/26/2021 0000   LDLCALC 89 09/11/2021 0810   LABVLDL 21 09/11/2021 0810  A1c of 8.5% on July 11, 2022.   Assessment & Plan:   1. Type 2 diabetes mellitus with vascular disease (HCC)  -His  diabetes is complicated by recurrent CVA , coronary artery disease, obesity/sedentary life and patient remains at extremely  high risk for more acute and chronic complications of diabetes which include CAD, CVA, CKD, retinopathy, and neuropathy. These are all discussed in detail with the patient.  Mr. Italiano presents with his CGM device.  While acknowledging that he did run higher blood glucose readings for most of the interval, he recently did better.  He is CGM AGP was analyzed showing 61% time range, 33% level 1 hyperglycemia, 4% level 2 hyperglycemia.  His point-of-care A1c is 8.7% however his average blood glucose is 161 mg per DL for the last 14 days, 801 for the last 90 days.  He did not have significant hypoglycemia.     - Glucose logs and insulin  administration records pertaining to this visit,  to be scanned into patient's records.  Recent labs reviewed.  He is diagnosed with lymphoma recently, being prepared for  chemotherapy.  - I have re-counseled the patient on diet management and weight loss  by adopting a carbohydrate restricted / protein rich  Diet.  - he acknowledges that there is a room for improvement in his food and drink choices. - Suggestion is made for him to avoid simple carbohydrates  from his diet including Cakes, Sweet Desserts, Ice Cream, Soda (diet and regular), Sweet Tea, Candies, Chips, Cookies, Store Bought Juices, Alcohol  , Artificial Sweeteners,  Coffee Creamer, and Sugar-free Products, Lemonade. This will help patient to have more stable blood glucose profile and potentially avoid unintended weight gain.  The following Lifestyle Medicine recommendations according to American College of Lifestyle Medicine  [facility]) were discussed and and offered to patient and he  agrees to start the journey:  A. Whole Foods, Plant-Based Nutrition comprising of fruits and vegetables, plant-based proteins, whole-grain carbohydrates was discussed in detail with the patient.   A list for source of those nutrients were also provided to the patient.  Patient will use only water or unsweetened tea for hydration. B.  The need to stay away from risky substances including alcohol , smoking; obtaining 7 to 9 hours of restorative sleep, at least 150 minutes of moderate intensity exercise weekly, the importance of healthy social connections,  and stress management techniques were discussed. C.  A full color page of  Calorie density of various food groups per pound showing examples of each food groups was provided to the patient.   - Patient is advised to stick to a routine mealtimes to  eat 3 meals  a day and avoid unnecessary snacks ( to snack only to correct hypoglycemia).  - I have approached patient with the following individualized plan to manage diabetes and patient agrees.  -  He promises to engage better.  He has done better in the recent weeks, and with his current insulin  U500 than basal/bolus insulin   regimen.   He is advised to continue  Humulin  U500  60 units 3 times daily AC for Premeal blood glucose readings of 90 mg per DL or above.   -I have advised him on how he can rotate insulin  injection sites on his abdominal skin. -He is warned not not to take insulin  without proper monitoring of blood glucose.  He is advised to  call clinic if he registers blood glucose less than 70 mg/dL or greater than 799 mg/dL.  He will continue to benefit from  metformin -advised to lower metformin  to 500 mg p.o. daily at breakfast.  -He is benefiting from Farxiga .  He is advised to continue Farxiga  10 mg p.o. daily at breakfast.   Side effects and precautions discussed with him.    - Patient specific target  for A1c; LDL, HDL, Triglycerides, and  Waist Circumference were discussed in detail.  2) BP/HTN: His blood pressure is controlled to target.  He is currently on Coreg , Imdur .    3) Lipids/HPL: His lipid panel shows stable LDL at 82.  His advised to continue Crestor  40 mg p.o. daily at bedtime.  Side effects and precautions discussed with him.      Whole food plant-based diet will help with his lipid panel, however, patient did not engage optimally.   4)  Weight/Diet: He is a candidate for weight loss.  His BMI is 31 kg/m.  5) hypothyroidism- His recent  thyroid  function tests are consistent with appropriate replacement.  He is advised to continue levothyroxine  75 mcg p.o. daily before breakfast.      - We discussed about the correct intake of his thyroid  hormone, on empty stomach at fasting, with water, separated by at least 30 minutes from breakfast and other medications,  and separated by more than 4 hours from calcium , iron, multivitamins, acid reflux medications (PPIs). -Patient is made aware of the fact that thyroid  hormone replacement is needed for life, dose to be adjusted by periodic monitoring of thyroid  function tests.   6) Chronic Care/Health Maintenance:  -Patient  is  on  ACEI/ARB and Statin medications and encouraged to continue to follow up with Ophthalmology, urologist given his recent recurrent CVA, podiatrist at least yearly or according to recommendations, and advised to  stay away from smoking. I have recommended yearly flu vaccine and pneumonia vaccination at least every 5 years; moderate intensity exercise for up to 150 minutes weekly; and  sleep for at least 7 hours a day.  POCT ABI was normal on April 09, 2020, next study will be repeated in January 2027, or sooner if needed.    - I advised patient to maintain close follow up with Burdine, Steven E, MD for primary care needs.    I spent  41  minutes in the care of the patient today including review of labs from CMP, Lipids, Thyroid  Function, Hematology (current and previous including abstractions from other facilities); face-to-face time discussing  his blood glucose readings/logs, discussing hypoglycemia and hyperglycemia episodes and symptoms, medications doses, his options of short and long term treatment based on the latest standards of care / guidelines;  discussion about incorporating  lifestyle medicine;  and documenting the encounter. Risk reduction counseling performed per USPSTF guidelines to reduce  obesity and cardiovascular risk factors.     Please refer to Patient Instructions for Blood Glucose Monitoring and Insulin /Medications Dosing Guide  in media tab for additional information. Please  also refer to  Patient Self Inventory in the Media  tab for reviewed elements of pertinent patient history.  Alex Cortez participated in the discussions, expressed understanding, and voiced agreement with the above plans.  All questions were answered to his satisfaction. he is encouraged to contact clinic should he have any questions or concerns prior to his return visit.    Follow up plan: -Return in about 4 months (around 08/17/2023) for Bring Meter/CGM Device/Logs- A1c in Office.  Ranny Earl,  MD Phone: [phone]  Fax: [phone]  -  This note was partially dictated with voice recognition software. Similar sounding words can be transcribed inadequately or may not  be corrected upon review.  04/19/2023, 9:38 AM

## 2023-04-19 NOTE — Patient Instructions (Signed)

## 2023-04-20 ENCOUNTER — Other Ambulatory Visit: Payer: Self-pay

## 2023-04-28 ENCOUNTER — Ambulatory Visit (HOSPITAL_COMMUNITY)
Admission: RE | Admit: 2023-04-28 | Discharge: 2023-04-28 | Disposition: A | Discharge status: Home / Self Care | Payer: Medicare Other | Source: Ambulatory Visit | Attending: Hematology and Oncology | Admitting: Hematology and Oncology

## 2023-04-28 DIAGNOSIS — C8338 Diffuse large B-cell lymphoma, lymph nodes of multiple sites: Secondary | ICD-10-CM | POA: Insufficient documentation

## 2023-04-28 DIAGNOSIS — Z8572 Personal history of non-Hodgkin lymphomas: Secondary | ICD-10-CM | POA: Diagnosis not present

## 2023-04-28 DIAGNOSIS — I700 Atherosclerosis of aorta: Secondary | ICD-10-CM | POA: Diagnosis not present

## 2023-04-28 DIAGNOSIS — R918 Other nonspecific abnormal finding of lung field: Secondary | ICD-10-CM | POA: Diagnosis not present

## 2023-04-28 MED ORDER — IOHEXOL 300 MG/ML  SOLN
100.0000 mL | Freq: Once | INTRAMUSCULAR | Status: AC | PRN
  Administered 2023-04-28: 100 mL via INTRAVENOUS

## 2023-04-28 MED ORDER — IOHEXOL 300 MG/ML  SOLN
30.0000 mL | Freq: Once | INTRAMUSCULAR | Status: AC | PRN
  Administered 2023-04-28: 30 mL via ORAL

## 2023-05-05 ENCOUNTER — Telehealth: Payer: Self-pay | Admitting: *Deleted

## 2023-05-05 DIAGNOSIS — R300 Dysuria: Secondary | ICD-10-CM

## 2023-05-05 NOTE — Telephone Encounter (Signed)
TCT patient regarding recent CT scan. Spoke with him. Advisedthat his CT scan looks excellent from a lymphoma perspective. There is no evidence of residual or recurrent lymphoma. There are findings on his scan however that suggest he might have a urinary tract infection. Asked him if he is having any urinary symptoms. He siad his scrotum is very swollen and tender.Advised it would be best if he came back to our clinic for a UA and urine culture lab visit.Marland Kitchen He is agreeable to this. States he can come in tomorrow morning @ !0am. Orders for u/a and culture placed. Scheduling message sent.

## 2023-05-05 NOTE — Telephone Encounter (Signed)
-----   Message from Nurse Lorra Hals sent at 05/03/2023  5:55 PM EST ----- Regarding: Please call Did not get to this one ----- Message ----- From: Kyra Searles, RN Sent: 05/02/2023   4:51 PM EST To: Tonny Bollman, RN   ----- Message ----- From: Jaci Standard, MD Sent: 05/02/2023  10:06 AM EST To: Kyra Searles, RN  Please let Mr. Renken know that his CT scan looks excellent from a lymphoma perspective.  There is no evidence of residual or recurrent lymphoma.  There are findings on his scan however that suggest he might have a urinary tract infection.  Please ask him if he is having any urinary symptoms.  Additionally I think it would be best if he came back to our clinic for a UA and urine culture lab visit.Marland Kitchen  Please have this scheduled with him as soon as is feasible. ----- Message ----- From: Interface, Rad Results In Sent: 04/30/2023  10:17 AM EST To: Jaci Standard, MD

## 2023-05-06 ENCOUNTER — Emergency Department (HOSPITAL_COMMUNITY)
Admission: EM | Admit: 2023-05-06 | Discharge: 2023-05-06 | Disposition: A | Discharge status: Home / Self Care | Payer: Medicare Other | Attending: Emergency Medicine | Admitting: Emergency Medicine

## 2023-05-06 ENCOUNTER — Inpatient Hospital Stay: Payer: Medicare Other

## 2023-05-06 ENCOUNTER — Emergency Department (HOSPITAL_COMMUNITY): Payer: Medicare Other

## 2023-05-06 ENCOUNTER — Encounter (HOSPITAL_COMMUNITY): Payer: Self-pay

## 2023-05-06 ENCOUNTER — Other Ambulatory Visit: Payer: Self-pay

## 2023-05-06 ENCOUNTER — Encounter: Payer: Self-pay | Admitting: Hematology and Oncology

## 2023-05-06 DIAGNOSIS — I2510 Atherosclerotic heart disease of native coronary artery without angina pectoris: Secondary | ICD-10-CM | POA: Diagnosis not present

## 2023-05-06 DIAGNOSIS — R300 Dysuria: Secondary | ICD-10-CM

## 2023-05-06 DIAGNOSIS — M7989 Other specified soft tissue disorders: Secondary | ICD-10-CM | POA: Diagnosis not present

## 2023-05-06 DIAGNOSIS — Z794 Long term (current) use of insulin: Secondary | ICD-10-CM | POA: Insufficient documentation

## 2023-05-06 DIAGNOSIS — Z6833 Body mass index (BMI) 33.0-33.9, adult: Secondary | ICD-10-CM | POA: Diagnosis not present

## 2023-05-06 DIAGNOSIS — Z7984 Long term (current) use of oral hypoglycemic drugs: Secondary | ICD-10-CM | POA: Diagnosis not present

## 2023-05-06 DIAGNOSIS — N451 Epididymitis: Secondary | ICD-10-CM | POA: Insufficient documentation

## 2023-05-06 DIAGNOSIS — E119 Type 2 diabetes mellitus without complications: Secondary | ICD-10-CM | POA: Diagnosis not present

## 2023-05-06 DIAGNOSIS — N5089 Other specified disorders of the male genital organs: Secondary | ICD-10-CM | POA: Diagnosis not present

## 2023-05-06 DIAGNOSIS — Z7902 Long term (current) use of antithrombotics/antiplatelets: Secondary | ICD-10-CM | POA: Diagnosis not present

## 2023-05-06 DIAGNOSIS — R1030 Lower abdominal pain, unspecified: Secondary | ICD-10-CM | POA: Diagnosis present

## 2023-05-06 DIAGNOSIS — N5082 Scrotal pain: Secondary | ICD-10-CM | POA: Diagnosis not present

## 2023-05-06 DIAGNOSIS — N433 Hydrocele, unspecified: Secondary | ICD-10-CM | POA: Diagnosis not present

## 2023-05-06 LAB — URINALYSIS, COMPLETE (UACMP) WITH MICROSCOPIC
Bacteria, UA: NONE SEEN
Bilirubin Urine: NEGATIVE
Glucose, UA: >=500 mg/dL — AB
Hgb urine dipstick: NEGATIVE
Ketones, ur: NEGATIVE mg/dL
Leukocytes,Ua: NEGATIVE
Nitrite: NEGATIVE
Protein, ur: NEGATIVE mg/dL
Specific Gravity, Urine: 1.022 (ref 1.005–1.030)
pH: 5 (ref 5.0–8.0)

## 2023-05-06 LAB — COMPREHENSIVE METABOLIC PANEL
ALT: 34 U/L (ref 0–44)
AST: 32 U/L (ref 15–41)
Albumin: 3.6 g/dL (ref 3.5–5.0)
Alkaline Phosphatase: 46 U/L (ref 38–126)
Anion gap: 13 (ref 5–15)
BUN: 18 mg/dL (ref 8–23)
CO2: 23 mmol/L (ref 22–32)
Calcium: 9.1 mg/dL (ref 8.9–10.3)
Chloride: 106 mmol/L (ref 98–111)
Creatinine, Ser: 1.11 mg/dL (ref 0.61–1.24)
GFR, Estimated: >60 mL/min (ref >60)
Glucose, Bld: 139 mg/dL — ABNORMAL HIGH (ref 70–99)
Potassium: 4.2 mmol/L (ref 3.5–5.1)
Sodium: 142 mmol/L (ref 135–145)
Total Bilirubin: 1 mg/dL (ref 0.0–1.2)
Total Protein: 7.4 g/dL (ref 6.5–8.1)

## 2023-05-06 LAB — CBC WITH DIFFERENTIAL/PLATELET
Abs Immature Granulocytes: 0.01 10*3/uL (ref 0.00–0.07)
Basophils Absolute: 0.1 10*3/uL (ref 0.0–0.1)
Basophils Relative: 1 %
Eosinophils Absolute: 0.6 10*3/uL — ABNORMAL HIGH (ref 0.0–0.5)
Eosinophils Relative: 11 %
HCT: 38.5 % — ABNORMAL LOW (ref 39.0–52.0)
Hemoglobin: 11.8 g/dL — ABNORMAL LOW (ref 13.0–17.0)
Immature Granulocytes: 0 %
Lymphocytes Relative: 27 %
Lymphs Abs: 1.4 10*3/uL (ref 0.7–4.0)
MCH: 25.4 pg — ABNORMAL LOW (ref 26.0–34.0)
MCHC: 30.6 g/dL (ref 30.0–36.0)
MCV: 83 fL (ref 80.0–100.0)
Monocytes Absolute: 0.5 10*3/uL (ref 0.1–1.0)
Monocytes Relative: 9 %
Neutro Abs: 2.8 10*3/uL (ref 1.7–7.7)
Neutrophils Relative %: 52 %
Platelets: 223 10*3/uL (ref 150–400)
RBC: 4.64 MIL/uL (ref 4.22–5.81)
RDW: 14.6 % (ref 11.5–15.5)
WBC: 5.4 10*3/uL (ref 4.0–10.5)
nRBC: 0 % (ref 0.0–0.2)

## 2023-05-06 LAB — URINALYSIS, ROUTINE W REFLEX MICROSCOPIC
Bacteria, UA: NONE SEEN
Bilirubin Urine: NEGATIVE
Glucose, UA: >=500 mg/dL — AB
Hgb urine dipstick: NEGATIVE
Ketones, ur: NEGATIVE mg/dL
Leukocytes,Ua: NEGATIVE
Nitrite: NEGATIVE
Protein, ur: NEGATIVE mg/dL
Specific Gravity, Urine: 1.025 (ref 1.005–1.030)
pH: 5 (ref 5.0–8.0)

## 2023-05-06 LAB — BRAIN NATRIURETIC PEPTIDE: B Natriuretic Peptide: 86 pg/mL (ref 0.0–100.0)

## 2023-05-06 MED ORDER — CEFTRIAXONE SODIUM 1 G IJ SOLR
1.0000 g | Freq: Once | INTRAMUSCULAR | Status: AC
  Administered 2023-05-06: 1 g via INTRAMUSCULAR
  Filled 2023-05-06: qty 10

## 2023-05-06 MED ORDER — LEVOFLOXACIN 750 MG PO TABS: 750.0000 mg | ORAL_TABLET | Freq: Every day | ORAL | 0 refills | Status: DC

## 2023-05-06 NOTE — ED Triage Notes (Signed)
Pt reports he was sent by his PCP due to testicle swelling since Saturday. Pt denies pain to the area.

## 2023-05-06 NOTE — ED Provider Notes (Signed)
 [place] EMERGENCY DEPARTMENT AT [facility] Provider Note   CSN: [id number] Arrival date & time: 05/06/23  1514     History  Chief Complaint  Patient presents with   Groin Swelling    Alex Cortez is a 73 y.o. male.  Patient with coronary artery disease and diabetes.  He has been swelling in his scrotum since Sunday.  The history is provided by the patient and medical records.  Groin Pain This is a new problem. The problem occurs constantly. The problem has not changed since onset.Pertinent negatives include no chest pain, no abdominal pain and no headaches. Nothing aggravates the symptoms. Nothing relieves the symptoms. He has tried nothing for the symptoms.       Home Medications Prior to Admission medications   Medication Sig Start Date End Date Taking? Authorizing Provider  levofloxacin (LEVAQUIN) 750 MG tablet Take 1 tablet (750 mg total) by mouth daily. X 7 days 05/06/23  Yes Bethann Berkshire, MD  allopurinol (ZYLOPRIM) 300 MG tablet TAKE 1 TABLET BY MOUTH EVERY DAY 08/30/22   Namon Cirri E, PA-C  carvedilol (COREG) 12.5 MG tablet Take 1 tablet (12.5 mg total) by mouth 2 (two) times daily with a meal. 11/18/22   Rolly Salter, MD  clopidogrel (PLAVIX) 75 MG tablet Take 1 tablet (75 mg total) by mouth daily. 11/28/17   Ihor Austin, NP  Continuous Glucose Receiver (FREESTYLE LIBRE 3 READER) DEVI 1 Piece by Does not apply route once as needed for up to 1 dose. 12/13/22   Roma Kayser, MD  Continuous Glucose Sensor (FREESTYLE LIBRE 3 PLUS SENSOR) MISC Change sensor every 15 days. E11.65 04/19/23   Roma Kayser, MD  dapagliflozin propanediol (FARXIGA) 10 MG TABS tablet Take 1 tablet (10 mg total) by mouth daily with breakfast. 01/25/23   Nida, Denman George, MD  dapagliflozin propanediol (FARXIGA) 10 MG TABS tablet Take 1 tablet (10 mg total) by mouth daily before breakfast. 04/08/23   Nida, Denman George, MD  ezetimibe (ZETIA) 10 MG  tablet Take 1 tablet (10 mg total) by mouth daily. 12/27/22   Antoine Poche, MD  insulin regular human CONCENTRATED (HUMULIN R U-500 KWIKPEN) 500 UNIT/ML KwikPen INJECT AS DIRECTED 60 UNITS WITH BREAKFAST, 60 UNITS WITH LUNCH, AND 60 UNITS WITH SUPPER FOR PRE-MEAL BLOOD GLUCOSE READINGS OF 90MG /DL OR ABOVE 10/17/67   Roma Kayser, MD  isosorbide mononitrate (IMDUR) 30 MG 24 hr tablet TAKE 1 TABLET BY MOUTH EVERY DAY 09/13/22   Antoine Poche, MD  levothyroxine (SYNTHROID) 75 MCG tablet TAKE 1 TABLET BY MOUTH EVERY DAY BEFORE BREAKFAST 04/08/23   Roma Kayser, MD  lidocaine-prilocaine (EMLA) cream Apply 1 Application topically as needed. Patient taking differently: Apply 1 Application topically daily as needed (port access). 04/29/22   Jaci Standard, MD  metFORMIN (GLUCOPHAGE) 500 MG tablet TAKE 1 TABLET BY MOUTH EVERY DAY WITH BREAKFAST 04/08/23   Roma Kayser, MD  nitroGLYCERIN (NITROSTAT) 0.4 MG SL tablet Place 1 tablet (0.4 mg total) under the tongue every 5 (five) minutes as needed. 08/30/11   de Melanie Crazier, MD  ONE TOUCH ULTRA TEST test strip USE TO TEST BLOOD SUGAR FOUR TIMES DAILY E11.65 12/16/15   Roma Kayser, MD  rosuvastatin (CRESTOR) 40 MG tablet TAKE 1 TABLET BY MOUTH EVERY DAY 04/08/23   Antoine Poche, MD      Allergies    Ace inhibitors, Other, Rituxan [rituximab], and Shrimp [shellfish allergy]  Review of Systems   Review of Systems  Constitutional:  Negative for appetite change and fatigue.  HENT:  Negative for congestion, ear discharge and sinus pressure.   Eyes:  Negative for discharge.  Respiratory:  Negative for cough.   Cardiovascular:  Negative for chest pain.  Gastrointestinal:  Negative for abdominal pain and diarrhea.  Genitourinary:  Negative for frequency and hematuria.       Scrotal swelling  Musculoskeletal:  Negative for back pain.  Skin:  Negative for rash.  Neurological:  Negative for seizures and headaches.   Psychiatric/Behavioral:  Negative for hallucinations.     Physical Exam Updated Vital Signs BP (!) 141/71   Pulse 77   Temp 98.2 F (36.8 C) (Oral)   SpO2 98%  Physical Exam Vitals and nursing note reviewed.  Constitutional:      Appearance: He is well-developed.  HENT:     Head: Normocephalic.     Nose: Nose normal.  Eyes:     General: No scleral icterus.    Conjunctiva/sclera: Conjunctivae normal.  Neck:     Thyroid: No thyromegaly.  Cardiovascular:     Rate and Rhythm: Normal rate and regular rhythm.     Heart sounds: No murmur heard.    No friction rub. No gallop.  Pulmonary:     Breath sounds: No stridor. No wheezing or rales.  Chest:     Chest wall: No tenderness.  Abdominal:     General: There is no distension.     Tenderness: There is no abdominal tenderness. There is no rebound.  Genitourinary:    Comments: Large swollen scrotum which is mildly red but mildly tender Musculoskeletal:        General: Normal range of motion.     Cervical back: Neck supple.  Lymphadenopathy:     Cervical: No cervical adenopathy.  Skin:    Findings: No erythema or rash.  Neurological:     Mental Status: He is alert and oriented to person, place, and time.     Motor: No abnormal muscle tone.     Coordination: Coordination normal.  Psychiatric:        Behavior: Behavior normal.     ED Results / Procedures / Treatments   Labs (all labs ordered are listed, but only abnormal results are displayed) Labs Reviewed  URINALYSIS, ROUTINE W REFLEX MICROSCOPIC - Abnormal; Notable for the following components:      Result Value   Glucose, UA >=500 (*)    All other components within normal limits  CBC WITH DIFFERENTIAL/PLATELET - Abnormal; Notable for the following components:   Hemoglobin 11.8 (*)    HCT 38.5 (*)    MCH 25.4 (*)    Eosinophils Absolute 0.6 (*)    All other components within normal limits  COMPREHENSIVE METABOLIC PANEL - Abnormal; Notable for the following  components:   Glucose, Bld 139 (*)    All other components within normal limits  BRAIN NATRIURETIC PEPTIDE    EKG None  Radiology US SCROTUM W/DOPPLER Result Date: 05/06/2023 CLINICAL DATA:  Four day history of swelling and difficulty urinating EXAM: SCROTAL ULTRASOUND DOPPLER ULTRASOUND OF THE TESTICLES TECHNIQUE: Complete ultrasound examination of the testicles, epididymis, and other scrotal structures was performed. Color and spectral Doppler ultrasound were also utilized to evaluate blood flow to the testicles. COMPARISON:  None Available. FINDINGS: Right testicle Measurements: 4.3 x 2.9 x 2.5 cm, 22.1 mL. No mass or microlithiasis visualized. Left testicle Measurements: 4.4 x 2.7 x 2.6 cm, 21.9  mL. No mass or microlithiasis visualized. Right epididymis:  Normal in size and appearance. Left epididymis:  Heterogeneous and enlarged. Hydrocele:  Small bilateral hydroceles. Varicocele:  None visualized. Pulsed Doppler interrogation of both testes demonstrates normal low resistance arterial and venous waveforms bilaterally. Diffuse scrotal soft tissue swelling, right-greater-than-left. IMPRESSION: 1. No evidence of testicular torsion. 2. Heterogeneous and enlarged left epididymis, which may be seen in the setting of epididymitis. 3. Diffuse scrotal soft tissue swelling, right-greater-than-left, which may be reactive or related to cellulitis. 4. Small bilateral hydroceles. Electronically Signed   By: Agustin Cree M.D.   On: 05/06/2023 18:43    Procedures Procedures    Medications Ordered in ED Medications  cefTRIAXone (ROCEPHIN) injection 1 g (has no administration in time range)    ED Course/ Medical Decision Making/ A&P                                 Medical Decision Making Amount and/or Complexity of Data Reviewed Labs: ordered. Radiology: ordered.  Risk Prescription drug management.  Patient with epididymitis/orchitis.  Patient will be started on Levaquin and is given Rocephin here  in the emergency department.  He will follow-up with urology   I discussed this with Dr. Mena Goes for urology       Final Clinical Impression(s) / ED Diagnoses Final diagnoses:  Epididymitis    Rx / DC Orders ED Discharge Orders          Ordered    levofloxacin (LEVAQUIN) 750 MG tablet  Daily        05/06/23 2258              Bethann Berkshire, MD 05/08/23 1046

## 2023-05-06 NOTE — Discharge Instructions (Signed)
Start taking the antibiotics tomorrow.  Follow-up with [facility] next week.  Call them Monday for an appointment.  Return sooner if any problems.  Take Tylenol for pain if needed

## 2023-05-07 LAB — URINE CULTURE: Culture: NO GROWTH

## 2023-05-09 ENCOUNTER — Telehealth: Payer: Self-pay | Admitting: *Deleted

## 2023-05-09 NOTE — Telephone Encounter (Signed)
 Received call from pt regarding the results of his urine specimen. Advised that the he does not have a UTI according to the U/a and the urine culture. Pt was seen in the ED over the weekend for epididymitis and is currently being treated with antibiotics. He feels like it is not getting any better. Advised that he will need to contact his Urologist for further treatment. Pt voiced understanding.

## 2023-05-12 ENCOUNTER — Encounter: Payer: Self-pay | Admitting: Urology

## 2023-05-12 ENCOUNTER — Ambulatory Visit: Payer: Medicare Other | Admitting: Urology

## 2023-05-12 VITALS — BP 98/58 | HR 76 | Temp 98.0°F

## 2023-05-12 DIAGNOSIS — E1169 Type 2 diabetes mellitus with other specified complication: Secondary | ICD-10-CM | POA: Diagnosis not present

## 2023-05-12 DIAGNOSIS — N492 Inflammatory disorders of scrotum: Secondary | ICD-10-CM

## 2023-05-12 DIAGNOSIS — N5089 Other specified disorders of the male genital organs: Secondary | ICD-10-CM

## 2023-05-12 DIAGNOSIS — N453 Epididymo-orchitis: Secondary | ICD-10-CM

## 2023-05-12 DIAGNOSIS — E782 Mixed hyperlipidemia: Secondary | ICD-10-CM | POA: Diagnosis not present

## 2023-05-12 DIAGNOSIS — N4889 Other specified disorders of penis: Secondary | ICD-10-CM

## 2023-05-12 DIAGNOSIS — Z6833 Body mass index (BMI) 33.0-33.9, adult: Secondary | ICD-10-CM | POA: Diagnosis not present

## 2023-05-12 LAB — URINALYSIS, ROUTINE W REFLEX MICROSCOPIC
Bilirubin, UA: NEGATIVE
Ketones, UA: NEGATIVE
Leukocytes,UA: NEGATIVE
Nitrite, UA: NEGATIVE
Protein,UA: NEGATIVE
RBC, UA: NEGATIVE
Specific Gravity, UA: 1.025 (ref 1.005–1.030)
Urobilinogen, Ur: 1 mg/dL (ref 0.2–1.0)
pH, UA: 5.5 (ref 5.0–7.5)

## 2023-05-12 LAB — BLADDER SCAN AMB NON-IMAGING: Scan Result: 161

## 2023-05-12 MED ORDER — SULFAMETHOXAZOLE-TRIMETHOPRIM 800-160 MG PO TABS: 1.0000 | ORAL_TABLET | Freq: Two times a day (BID) | ORAL | 0 refills | Status: DC

## 2023-05-12 NOTE — Progress Notes (Signed)
 Name: Alex Cortez DOB: March 14, 1951 MRN: [id number]  History of Present Illness: [name] is a 73 y.o. male who presents today as a new patient at [facility] [place]. All available relevant medical records have been reviewed. He is accompanied by wife Maureen Ralphs. - GU History: 1. Kidney stone(s). 2. Erectile dysfunction.  Recent history:  > 04/28/2023: CT chest/abdomen/pelvis w/ contrast showed no GU stones, masses, or hydronephrosis; prostate unremarkable.  > 05/06/2023:  - Seen in ER for scrotal swelling.  - UA negative aside from glucosuria. - Scrotal / testicular ultrasound: 1. No evidence of testicular torsion. 2. Heterogeneous and enlarged left epididymis, which may be seen in the setting of epididymitis. 3. Diffuse scrotal soft tissue swelling, right-greater-than-left, which may be reactive or related to cellulitis. 4. Small bilateral hydroceles.  - Dr. Mena Goes was consulted. Diagnosed with epididymitis/orchitis.  - Received IM Rocephin.  - Discharged on Levaquin 750 mg daily x7 days.   Today: He reports bilateral scrotal and penile swelling; denies associated pain, redness, warmth, or tenderness. He is on day 6 or 7 of his Levaquin (was given 10 day supply) and does not feel that his symptoms are improving with that.   He denies acute urinary complaints. He denies fevers.   Fall Screening: Do you usually have a device to assist in your mobility? No   Medications: Current Outpatient Medications  Medication Sig Dispense Refill   allopurinol (ZYLOPRIM) 300 MG tablet TAKE 1 TABLET BY MOUTH EVERY DAY 90 tablet 1   carvedilol (COREG) 12.5 MG tablet Take 1 tablet (12.5 mg total) by mouth 2 (two) times daily with a meal. 60 tablet 0   clopidogrel (PLAVIX) 75 MG tablet Take 1 tablet (75 mg total) by mouth daily. 30 tablet 5   Continuous Glucose Receiver (FREESTYLE LIBRE 3 READER) DEVI 1 Piece by Does not apply route once as needed for up to 1 dose. 1 each 0    Continuous Glucose Sensor (FREESTYLE LIBRE 3 PLUS SENSOR) MISC Change sensor every 15 days. E11.65 6 each 1   dapagliflozin propanediol (FARXIGA) 10 MG TABS tablet Take 1 tablet (10 mg total) by mouth daily with breakfast. 90 tablet 0   dapagliflozin propanediol (FARXIGA) 10 MG TABS tablet Take 1 tablet (10 mg total) by mouth daily before breakfast. 90 tablet 0   ezetimibe (ZETIA) 10 MG tablet Take 1 tablet (10 mg total) by mouth daily. 90 tablet 2   insulin regular human CONCENTRATED (HUMULIN R U-500 KWIKPEN) 500 UNIT/ML KwikPen INJECT AS DIRECTED 60 UNITS WITH BREAKFAST, 60 UNITS WITH LUNCH, AND 60 UNITS WITH SUPPER FOR PRE-MEAL BLOOD GLUCOSE READINGS OF 90MG /DL OR ABOVE 36 mL 0   isosorbide mononitrate (IMDUR) 30 MG 24 hr tablet TAKE 1 TABLET BY MOUTH EVERY DAY 90 tablet 1   levothyroxine (SYNTHROID) 75 MCG tablet TAKE 1 TABLET BY MOUTH EVERY DAY BEFORE BREAKFAST 90 tablet 0   lidocaine-prilocaine (EMLA) cream Apply 1 Application topically as needed. (Patient taking differently: Apply 1 Application topically daily as needed (port access).) 30 g 0   metFORMIN (GLUCOPHAGE) 500 MG tablet TAKE 1 TABLET BY MOUTH EVERY DAY WITH BREAKFAST 90 tablet 0   nitroGLYCERIN (NITROSTAT) 0.4 MG SL tablet Place 1 tablet (0.4 mg total) under the tongue every 5 (five) minutes as needed. 25 tablet 3   ONE TOUCH ULTRA TEST test strip USE TO TEST BLOOD SUGAR FOUR TIMES DAILY E11.65 150 each 5   rosuvastatin (CRESTOR) 40 MG tablet TAKE 1 TABLET BY MOUTH  EVERY DAY 90 tablet 2   sulfamethoxazole-trimethoprim (BACTRIM DS) 800-160 MG tablet Take 1 tablet by mouth every 12 (twelve) hours. 20 tablet 0   tamsulosin (FLOMAX) 0.4 MG CAPS capsule Take 0.4 mg by mouth daily.     No current facility-administered medications for this visit.    Allergies: Allergies  Allergen Reactions   Ace Inhibitors Swelling    Angioedema   Other Swelling    Shellfish   Swelling and hives  Shellfish  Swelling and hives   Rituxan  [Rituximab] Other (See Comments)    Pt had sensitivity reaction to Rituxan. See progress note from 05/14/22. Pt able to complete infusion.    Shrimp [Shellfish Allergy] Hives and Swelling    Past Medical History:  Diagnosis Date   CAD (coronary artery disease)    nonobstructive CAD/patent LAD stent site; EF 65%, 08/2011; NSTEMI/DES LAD, 6/20 11   Chest pain    Diabetes mellitus    insulin-dependent   Dyslipidemia    HTN (hypertension)    Hypothyroidism    Myocardial infarction (HCC) 2011   Post MI syndrome (HCC) 08/29/2009   Stroke (HCC) 10/22/2012   Left inferior pons   Past Surgical History:  Procedure Laterality Date   COLONOSCOPY  08/2021   CORONARY ANGIOPLASTY WITH STENT PLACEMENT  2011   IR IMAGING GUIDED PORT INSERTION  04/26/2022   IR REMOVAL TUN ACCESS W/ PORT W/O FL MOD SED  01/20/2023   LYMPH NODE BIOPSY Right 04/14/2022   Procedure: RIGHT INGUINAL EXCISIONAL LYMPH NODE BIOPSY;  Surgeon: Fritzi Mandes, MD;  Location: [facility];  Service: General;  Laterality: Right;   Family History  Problem Relation Age of Onset   Colon cancer Mother    Hypertension Other        in all family members   Social History   Socioeconomic History   Marital status: Married    Spouse name: Not on file   Number of children: Not on file   Years of education: Not on file   Highest education level: Not on file  Occupational History   Not on file  Tobacco Use   Smoking status: Former    Current packs/day: 0.00    Average packs/day: 0.5 packs/day for 33.0 years (16.5 ttl pk-yrs)    Types: Cigarettes    Start date: 03/16/1955    Quit date: 03/15/1988    Years since quitting: 35.1   Smokeless tobacco: Never  Vaping Use   Vaping status: Never Used  Substance and Sexual Activity   Alcohol use: No    Alcohol/week: 0.0 standard drinks of alcohol   Drug use: No   Sexual activity: Not on file  Other Topics Concern   Not on file  Social History Narrative   Works at [employer]. Married.     Social Drivers of Corporate investment banker Strain: Low Risk  (01/03/2023)   Overall Financial Resource Strain (CARDIA)    Difficulty of Paying Living Expenses: Not very hard  Food Insecurity: No Food Insecurity (11/17/2022)   Hunger Vital Sign    Worried About Running Out of Food in the Last Year: Never true    Ran Out of Food in the Last Year: Never true  Transportation Needs: No Transportation Needs (01/03/2023)   PRAPARE - Administrator, Civil Service (Medical): No    Lack of Transportation (Non-Medical): No  Physical Activity: Inactive (11/22/2022)   Exercise Vital Sign    Days of Exercise per Week:  0 days    Minutes of Exercise per Session: 0 min  Stress: No Stress Concern Present (04/07/2022)   [employer] of Occupational Health - Occupational Stress Questionnaire    Feeling of Stress : Not at all  Social Connections: Unknown (07/31/2017)   Social Connection and Isolation Panel [NHANES]    Frequency of Communication with Friends and Family: Patient declined    Frequency of Social Gatherings with Friends and Family: Patient declined    Attends Religious Services: Patient declined    Database administrator or Organizations: Patient declined    Attends Banker Meetings: Patient declined    Marital Status: Patient declined  Intimate Partner Violence: Not At Risk (11/17/2022)   Humiliation, Afraid, Rape, and Kick questionnaire    Fear of Current or Ex-Partner: No    Emotionally Abused: No    Physically Abused: No    Sexually Abused: No    SUBJECTIVE  Review of Systems Constitutional: Patient denies any unintentional weight loss or change in strength lntegumentary: Patient denies any rashes or pruritus Cardiovascular: Patient denies chest pain or syncope Respiratory: Patient denies shortness of breath Gastrointestinal: Patient denies nausea, vomiting, constipation, or diarrhea Musculoskeletal: Patient denies muscle cramps or  weakness Neurologic: Patient denies convulsions or seizures Allergic/Immunologic: Patient denies recent allergic reaction(s) Hematologic/Lymphatic: Patient denies bleeding tendencies Endocrine: Patient denies heat/cold intolerance  GU: As per HPI.  OBJECTIVE Vitals:   05/12/23 1149  BP: (!) 98/58  Pulse: 76  Temp: 98 F (36.7 C)   There is no height or weight on file to calculate BMI.  Physical Examination Constitutional: No obvious distress; patient is non-toxic appearing  Cardiovascular: No visible lower extremity edema.  Respiratory: The patient does not have audible wheezing/stridor; respirations do not appear labored  Gastrointestinal: Abdomen non-distended Musculoskeletal: Normal ROM of UEs  Skin: No obvious rashes/open sores  Neurologic: CN 2-12 grossly intact Psychiatric: Answered questions appropriately with normal affect  Hematologic/Lymphatic/Immunologic: No obvious bruises or sites of spontaneous bleeding  Genitourinary: Penis and scrotum are edematous; mild warmth. No fluctuance, erythema, rash, lesions. Mild tenderness to palpation. Penis has distal curve to the right.   Pelvic exam was chaperoned by patient's wife.  UA: glucosuria; otherwise unremarkable PVR: 161 ml  ASSESSMENT Epididymo-orchitis - Plan: Urinalysis, Routine w reflex microscopic, BLADDER SCAN AMB NON-IMAGING, sulfamethoxazole-trimethoprim (BACTRIM DS) 800-160 MG tablet  Edema of penis - Plan: sulfamethoxazole-trimethoprim (BACTRIM DS) 800-160 MG tablet  Scrotal edema - Plan: sulfamethoxazole-trimethoprim (BACTRIM DS) 800-160 MG tablet  Cellulitis of scrotum - Plan: sulfamethoxazole-trimethoprim (BACTRIM DS) 800-160 MG tablet  Epididymo-orchitis and penoscrotal cellulitis refractory to Levaquin. We agreed to discontinue that and switch to Bactrim DS twice daily x10 days. Will plan for follow up in 2 weeks or sooner if needed. Patient and wife verbalized understanding of and agreement with  current plan. All questions were answered.  PLAN Advised the following: Discontinue Levaquin. Start Bactrim DS twice daily x10 days.  Return in about 2 weeks (around 05/26/2023) for UA, PVR, & f/u with Evette Georges NP.  Orders Placed This Encounter  Procedures   Urinalysis, Routine w reflex microscopic   BLADDER SCAN AMB NON-IMAGING    It has been explained that the patient is to follow regularly with their PCP in addition to all other providers involved in their care and to follow instructions provided by these respective offices. Patient advised to contact urology clinic if any urologic-pertaining questions, concerns, new symptoms or problems arise in the interim period.  There are no  Patient Instructions on file for this visit.  Electronically signed by:  Donnita Falls, MSN, FNP-C, CUNP 05/12/2023 12:11 PM

## 2023-05-13 DIAGNOSIS — I700 Atherosclerosis of aorta: Secondary | ICD-10-CM | POA: Diagnosis not present

## 2023-05-13 DIAGNOSIS — E1122 Type 2 diabetes mellitus with diabetic chronic kidney disease: Secondary | ICD-10-CM | POA: Diagnosis not present

## 2023-05-13 DIAGNOSIS — I10 Essential (primary) hypertension: Secondary | ICD-10-CM | POA: Diagnosis not present

## 2023-05-18 ENCOUNTER — Other Ambulatory Visit: Payer: Self-pay | Admitting: Urology

## 2023-05-18 ENCOUNTER — Telehealth: Payer: Self-pay | Admitting: Urology

## 2023-05-18 DIAGNOSIS — N5089 Other specified disorders of the male genital organs: Secondary | ICD-10-CM

## 2023-05-18 DIAGNOSIS — N4889 Other specified disorders of penis: Secondary | ICD-10-CM

## 2023-05-18 DIAGNOSIS — N492 Inflammatory disorders of scrotum: Secondary | ICD-10-CM

## 2023-05-18 DIAGNOSIS — N453 Epididymo-orchitis: Secondary | ICD-10-CM

## 2023-05-18 MED ORDER — DOXYCYCLINE HYCLATE 100 MG PO CAPS: 100.0000 mg | ORAL_CAPSULE | Freq: Two times a day (BID) | ORAL | 0 refills | Status: DC

## 2023-05-18 NOTE — Telephone Encounter (Signed)
 Let Pt know Per NP "Stop Bactrim. I have ordered Doxycycline as alternative." See last encounters

## 2023-05-18 NOTE — Telephone Encounter (Signed)
 FYI and advise Pt currently taking bactrim

## 2023-05-18 NOTE — Telephone Encounter (Signed)
 Patient states started a new RX and now he has upset stomach and losing his balance.

## 2023-05-19 ENCOUNTER — Ambulatory Visit: Payer: Medicare Other | Admitting: Nurse Practitioner

## 2023-05-23 ENCOUNTER — Telehealth: Payer: Self-pay

## 2023-05-23 NOTE — Telephone Encounter (Signed)
 Patient's wife states that the doxycycline is hard on his stomach, I advised her to have him take the abx with a meal.  If he is still not able to tolerate the abx to call back and we would forward message to NP.  Wife voiced understanding.

## 2023-05-25 ENCOUNTER — Inpatient Hospital Stay (HOSPITAL_COMMUNITY)
Admission: EM | Admit: 2023-05-25 | Discharge: 2023-06-06 | DRG: 841 | Disposition: A | Discharge status: Hospice | Attending: Family Medicine | Admitting: Family Medicine

## 2023-05-25 ENCOUNTER — Emergency Department (HOSPITAL_COMMUNITY)

## 2023-05-25 ENCOUNTER — Other Ambulatory Visit: Payer: Self-pay

## 2023-05-25 ENCOUNTER — Encounter (HOSPITAL_COMMUNITY): Payer: Self-pay | Admitting: Emergency Medicine

## 2023-05-25 DIAGNOSIS — Z7902 Long term (current) use of antithrombotics/antiplatelets: Secondary | ICD-10-CM

## 2023-05-25 DIAGNOSIS — R7881 Bacteremia: Secondary | ICD-10-CM | POA: Diagnosis not present

## 2023-05-25 DIAGNOSIS — C83398 Diffuse large b-cell lymphoma of other extranodal and solid organ sites: Secondary | ICD-10-CM | POA: Diagnosis not present

## 2023-05-25 DIAGNOSIS — I10 Essential (primary) hypertension: Secondary | ICD-10-CM | POA: Diagnosis present

## 2023-05-25 DIAGNOSIS — R4182 Altered mental status, unspecified: Principal | ICD-10-CM

## 2023-05-25 DIAGNOSIS — I110 Hypertensive heart disease with heart failure: Secondary | ICD-10-CM | POA: Diagnosis present

## 2023-05-25 DIAGNOSIS — Z8701 Personal history of pneumonia (recurrent): Secondary | ICD-10-CM

## 2023-05-25 DIAGNOSIS — E669 Obesity, unspecified: Secondary | ICD-10-CM | POA: Diagnosis present

## 2023-05-25 DIAGNOSIS — Z8572 Personal history of non-Hodgkin lymphomas: Secondary | ICD-10-CM | POA: Diagnosis not present

## 2023-05-25 DIAGNOSIS — I700 Atherosclerosis of aorta: Secondary | ICD-10-CM | POA: Diagnosis present

## 2023-05-25 DIAGNOSIS — C8339 Primary central nervous system lymphoma: Secondary | ICD-10-CM | POA: Diagnosis present

## 2023-05-25 DIAGNOSIS — E039 Hypothyroidism, unspecified: Secondary | ICD-10-CM | POA: Diagnosis present

## 2023-05-25 DIAGNOSIS — R1900 Intra-abdominal and pelvic swelling, mass and lump, unspecified site: Secondary | ICD-10-CM

## 2023-05-25 DIAGNOSIS — K6289 Other specified diseases of anus and rectum: Secondary | ICD-10-CM | POA: Diagnosis present

## 2023-05-25 DIAGNOSIS — D329 Benign neoplasm of meninges, unspecified: Secondary | ICD-10-CM | POA: Diagnosis not present

## 2023-05-25 DIAGNOSIS — I6930 Unspecified sequelae of cerebral infarction: Secondary | ICD-10-CM

## 2023-05-25 DIAGNOSIS — E1151 Type 2 diabetes mellitus with diabetic peripheral angiopathy without gangrene: Secondary | ICD-10-CM | POA: Diagnosis present

## 2023-05-25 DIAGNOSIS — E7800 Pure hypercholesterolemia, unspecified: Secondary | ICD-10-CM | POA: Diagnosis present

## 2023-05-25 DIAGNOSIS — C8336 Diffuse large B-cell lymphoma, intrapelvic lymph nodes: Secondary | ICD-10-CM | POA: Diagnosis present

## 2023-05-25 DIAGNOSIS — C8350 Lymphoblastic (diffuse) lymphoma, unspecified site: Secondary | ICD-10-CM | POA: Diagnosis not present

## 2023-05-25 DIAGNOSIS — M6281 Muscle weakness (generalized): Secondary | ICD-10-CM | POA: Diagnosis not present

## 2023-05-25 DIAGNOSIS — I69322 Dysarthria following cerebral infarction: Secondary | ICD-10-CM

## 2023-05-25 DIAGNOSIS — Z794 Long term (current) use of insulin: Secondary | ICD-10-CM

## 2023-05-25 DIAGNOSIS — Z66 Do not resuscitate: Secondary | ICD-10-CM | POA: Diagnosis not present

## 2023-05-25 DIAGNOSIS — C8330 Diffuse large B-cell lymphoma, unspecified site: Secondary | ICD-10-CM | POA: Diagnosis not present

## 2023-05-25 DIAGNOSIS — R222 Localized swelling, mass and lump, trunk: Secondary | ICD-10-CM | POA: Diagnosis not present

## 2023-05-25 DIAGNOSIS — E1159 Type 2 diabetes mellitus with other circulatory complications: Secondary | ICD-10-CM | POA: Diagnosis not present

## 2023-05-25 DIAGNOSIS — Z1152 Encounter for screening for COVID-19: Secondary | ICD-10-CM | POA: Diagnosis not present

## 2023-05-25 DIAGNOSIS — R410 Disorientation, unspecified: Secondary | ICD-10-CM | POA: Diagnosis not present

## 2023-05-25 DIAGNOSIS — N453 Epididymo-orchitis: Secondary | ICD-10-CM | POA: Diagnosis present

## 2023-05-25 DIAGNOSIS — R935 Abnormal findings on diagnostic imaging of other abdominal regions, including retroperitoneum: Secondary | ICD-10-CM | POA: Diagnosis not present

## 2023-05-25 DIAGNOSIS — R220 Localized swelling, mass and lump, head: Secondary | ICD-10-CM | POA: Diagnosis not present

## 2023-05-25 DIAGNOSIS — R531 Weakness: Secondary | ICD-10-CM | POA: Diagnosis not present

## 2023-05-25 DIAGNOSIS — D320 Benign neoplasm of cerebral meninges: Secondary | ICD-10-CM | POA: Diagnosis not present

## 2023-05-25 DIAGNOSIS — Z8673 Personal history of transient ischemic attack (TIA), and cerebral infarction without residual deficits: Secondary | ICD-10-CM | POA: Diagnosis not present

## 2023-05-25 DIAGNOSIS — Z9889 Other specified postprocedural states: Secondary | ICD-10-CM

## 2023-05-25 DIAGNOSIS — Z955 Presence of coronary angioplasty implant and graft: Secondary | ICD-10-CM

## 2023-05-25 DIAGNOSIS — Z7401 Bed confinement status: Secondary | ICD-10-CM | POA: Diagnosis not present

## 2023-05-25 DIAGNOSIS — B957 Other staphylococcus as the cause of diseases classified elsewhere: Secondary | ICD-10-CM | POA: Diagnosis present

## 2023-05-25 DIAGNOSIS — N401 Enlarged prostate with lower urinary tract symptoms: Secondary | ICD-10-CM | POA: Diagnosis not present

## 2023-05-25 DIAGNOSIS — E1169 Type 2 diabetes mellitus with other specified complication: Secondary | ICD-10-CM | POA: Diagnosis not present

## 2023-05-25 DIAGNOSIS — D321 Benign neoplasm of spinal meninges: Secondary | ICD-10-CM | POA: Diagnosis present

## 2023-05-25 DIAGNOSIS — Z515 Encounter for palliative care: Secondary | ICD-10-CM | POA: Diagnosis not present

## 2023-05-25 DIAGNOSIS — I252 Old myocardial infarction: Secondary | ICD-10-CM

## 2023-05-25 DIAGNOSIS — E860 Dehydration: Secondary | ICD-10-CM | POA: Diagnosis not present

## 2023-05-25 DIAGNOSIS — Z79890 Hormone replacement therapy (postmenopausal): Secondary | ICD-10-CM

## 2023-05-25 DIAGNOSIS — I6782 Cerebral ischemia: Secondary | ICD-10-CM | POA: Diagnosis not present

## 2023-05-25 DIAGNOSIS — R1909 Other intra-abdominal and pelvic swelling, mass and lump: Secondary | ICD-10-CM | POA: Diagnosis present

## 2023-05-25 DIAGNOSIS — I2510 Atherosclerotic heart disease of native coronary artery without angina pectoris: Secondary | ICD-10-CM | POA: Diagnosis not present

## 2023-05-25 DIAGNOSIS — Z8619 Personal history of other infectious and parasitic diseases: Secondary | ICD-10-CM

## 2023-05-25 DIAGNOSIS — Z79899 Other long term (current) drug therapy: Secondary | ICD-10-CM

## 2023-05-25 DIAGNOSIS — N4889 Other specified disorders of penis: Secondary | ICD-10-CM | POA: Diagnosis present

## 2023-05-25 DIAGNOSIS — N5089 Other specified disorders of the male genital organs: Secondary | ICD-10-CM | POA: Diagnosis not present

## 2023-05-25 DIAGNOSIS — G4733 Obstructive sleep apnea (adult) (pediatric): Secondary | ICD-10-CM | POA: Diagnosis present

## 2023-05-25 DIAGNOSIS — R1032 Left lower quadrant pain: Secondary | ICD-10-CM | POA: Diagnosis not present

## 2023-05-25 DIAGNOSIS — N179 Acute kidney failure, unspecified: Secondary | ICD-10-CM | POA: Diagnosis not present

## 2023-05-25 DIAGNOSIS — G9349 Other encephalopathy: Secondary | ICD-10-CM | POA: Diagnosis present

## 2023-05-25 DIAGNOSIS — Z8249 Family history of ischemic heart disease and other diseases of the circulatory system: Secondary | ICD-10-CM

## 2023-05-25 DIAGNOSIS — Z888 Allergy status to other drugs, medicaments and biological substances status: Secondary | ICD-10-CM

## 2023-05-25 DIAGNOSIS — I5032 Chronic diastolic (congestive) heart failure: Secondary | ICD-10-CM | POA: Diagnosis present

## 2023-05-25 DIAGNOSIS — Z91013 Allergy to seafood: Secondary | ICD-10-CM

## 2023-05-25 DIAGNOSIS — Z800 Family history of malignant neoplasm of digestive organs: Secondary | ICD-10-CM

## 2023-05-25 DIAGNOSIS — D830 Common variable immunodeficiency with predominant abnormalities of B-cell numbers and function: Secondary | ICD-10-CM | POA: Diagnosis not present

## 2023-05-25 DIAGNOSIS — Z7189 Other specified counseling: Secondary | ICD-10-CM | POA: Diagnosis not present

## 2023-05-25 DIAGNOSIS — R569 Unspecified convulsions: Secondary | ICD-10-CM | POA: Diagnosis not present

## 2023-05-25 DIAGNOSIS — R338 Other retention of urine: Secondary | ICD-10-CM | POA: Diagnosis not present

## 2023-05-25 DIAGNOSIS — R262 Difficulty in walking, not elsewhere classified: Secondary | ICD-10-CM | POA: Diagnosis not present

## 2023-05-25 DIAGNOSIS — Z7984 Long term (current) use of oral hypoglycemic drugs: Secondary | ICD-10-CM

## 2023-05-25 DIAGNOSIS — I959 Hypotension, unspecified: Secondary | ICD-10-CM | POA: Diagnosis not present

## 2023-05-25 DIAGNOSIS — Z6830 Body mass index (BMI) 30.0-30.9, adult: Secondary | ICD-10-CM

## 2023-05-25 DIAGNOSIS — Z9221 Personal history of antineoplastic chemotherapy: Secondary | ICD-10-CM

## 2023-05-25 DIAGNOSIS — R197 Diarrhea, unspecified: Secondary | ICD-10-CM | POA: Diagnosis not present

## 2023-05-25 DIAGNOSIS — Z87891 Personal history of nicotine dependence: Secondary | ICD-10-CM

## 2023-05-25 LAB — COMPREHENSIVE METABOLIC PANEL
ALT: 28 U/L (ref 0–44)
AST: 20 U/L (ref 15–41)
Albumin: 3.9 g/dL (ref 3.5–5.0)
Alkaline Phosphatase: 61 U/L (ref 38–126)
Anion gap: 13 (ref 5–15)
BUN: 34 mg/dL — ABNORMAL HIGH (ref 8–23)
CO2: 20 mmol/L — ABNORMAL LOW (ref 22–32)
Calcium: 9.7 mg/dL (ref 8.9–10.3)
Chloride: 104 mmol/L (ref 98–111)
Creatinine, Ser: 1.79 mg/dL — ABNORMAL HIGH (ref 0.61–1.24)
GFR, Estimated: 40 mL/min — ABNORMAL LOW (ref >60)
Glucose, Bld: 212 mg/dL — ABNORMAL HIGH (ref 70–99)
Potassium: 5.4 mmol/L — ABNORMAL HIGH (ref 3.5–5.1)
Sodium: 137 mmol/L (ref 135–145)
Total Bilirubin: 1.4 mg/dL — ABNORMAL HIGH (ref 0.0–1.2)
Total Protein: 8.1 g/dL (ref 6.5–8.1)

## 2023-05-25 LAB — CBC WITH DIFFERENTIAL/PLATELET
Abs Immature Granulocytes: 0.02 10*3/uL (ref 0.00–0.07)
Basophils Absolute: 0.1 10*3/uL (ref 0.0–0.1)
Basophils Relative: 2 %
Eosinophils Absolute: 0.2 10*3/uL (ref 0.0–0.5)
Eosinophils Relative: 3 %
HCT: 43.2 % (ref 39.0–52.0)
Hemoglobin: 13.6 g/dL (ref 13.0–17.0)
Immature Granulocytes: 0 %
Lymphocytes Relative: 30 %
Lymphs Abs: 1.8 10*3/uL (ref 0.7–4.0)
MCH: 25.7 pg — ABNORMAL LOW (ref 26.0–34.0)
MCHC: 31.5 g/dL (ref 30.0–36.0)
MCV: 81.7 fL (ref 80.0–100.0)
Monocytes Absolute: 0.4 10*3/uL (ref 0.1–1.0)
Monocytes Relative: 6 %
Neutro Abs: 3.6 10*3/uL (ref 1.7–7.7)
Neutrophils Relative %: 59 %
Platelets: 244 10*3/uL (ref 150–400)
RBC: 5.29 MIL/uL (ref 4.22–5.81)
RDW: 14.6 % (ref 11.5–15.5)
WBC: 6.1 10*3/uL (ref 4.0–10.5)
nRBC: 0 % (ref 0.0–0.2)

## 2023-05-25 LAB — LACTIC ACID, PLASMA
Lactic Acid, Venous: 2.2 mmol/L (ref 0.5–1.9)
Lactic Acid, Venous: 2.2 mmol/L (ref 0.5–1.9)

## 2023-05-25 LAB — URINALYSIS, W/ REFLEX TO CULTURE (INFECTION SUSPECTED)
Bacteria, UA: NONE SEEN
Bilirubin Urine: NEGATIVE
Glucose, UA: >=500 mg/dL — AB
Hgb urine dipstick: NEGATIVE
Ketones, ur: NEGATIVE mg/dL
Leukocytes,Ua: NEGATIVE
Nitrite: NEGATIVE
Protein, ur: 30 mg/dL — AB
Specific Gravity, Urine: 1.02 (ref 1.005–1.030)
pH: 5 (ref 5.0–8.0)

## 2023-05-25 LAB — APTT: aPTT: <22 s — ABNORMAL LOW (ref 24–36)

## 2023-05-25 LAB — RESP PANEL BY RT-PCR (RSV, FLU A&B, COVID)  RVPGX2
Influenza A by PCR: NEGATIVE
Influenza B by PCR: NEGATIVE
Resp Syncytial Virus by PCR: NEGATIVE
SARS Coronavirus 2 by RT PCR: NEGATIVE

## 2023-05-25 LAB — PROTIME-INR
INR: 1.1 (ref 0.8–1.2)
Prothrombin Time: 14.2 s (ref 11.4–15.2)

## 2023-05-25 LAB — CBG MONITORING, ED: Glucose-Capillary: 177 mg/dL — ABNORMAL HIGH (ref 70–99)

## 2023-05-25 LAB — AMMONIA: Ammonia: <10 umol/L (ref 9–35)

## 2023-05-25 MED ORDER — LACTATED RINGERS IV BOLUS (SEPSIS): 1000.0000 mL | Freq: Once | INTRAVENOUS | Status: DC

## 2023-05-25 MED ORDER — SODIUM CHLORIDE 0.9 % IV SOLN
2.0000 g | Freq: Once | INTRAVENOUS | Status: AC
  Administered 2023-05-25: 2 g via INTRAVENOUS
  Filled 2023-05-25: qty 20

## 2023-05-25 MED ORDER — SODIUM CHLORIDE 0.9 % IV BOLUS (SEPSIS)
1000.0000 mL | Freq: Once | INTRAVENOUS | Status: AC
  Administered 2023-05-25: 1000 mL via INTRAVENOUS

## 2023-05-25 MED ORDER — LACTATED RINGERS IV SOLN: INTRAVENOUS | Status: DC

## 2023-05-25 MED ORDER — SODIUM CHLORIDE 0.9 % IV SOLN
1000.0000 mL | INTRAVENOUS | Status: DC
  Administered 2023-05-25: 1000 mL via INTRAVENOUS

## 2023-05-25 MED ORDER — LACTATED RINGERS IV BOLUS (SEPSIS): 500.0000 mL | Freq: Once | INTRAVENOUS | Status: DC

## 2023-05-25 NOTE — ED Notes (Signed)
 Called lab to get BCx2

## 2023-05-25 NOTE — ED Triage Notes (Signed)
 Pt went to dr today for weakness, loss of appetite, disorientation, and abdominal pain. Wife is demanding that he be admitted. She reports the dr office told her he would be admitted right away. Pt has recent hx of scrotal cellulitis. Wife states he must be admitted because she cannot take care of him.

## 2023-05-25 NOTE — Sepsis Progress Note (Signed)
 Code Sepsis protocol being monitored by eLink.

## 2023-05-25 NOTE — Progress Notes (Signed)
 CODE SEPSIS - PHARMACY COMMUNICATION  **Broad Spectrum Antibiotics should be administered within 1 hour of Sepsis diagnosis**  Time Code Sepsis Called/Page Received: 1822  Antibiotics Ordered: Ceftriaxone  Time of 1st antibiotic administration: 1902  Additional action taken by pharmacy: N/A  If necessary, Name of Provider/Nurse Contacted: N/A    Merryl Hacker ,PharmD Clinical Pharmacist  05/25/2023  6:26 PM

## 2023-05-25 NOTE — ED Provider Notes (Signed)
 [place] EMERGENCY DEPARTMENT AT [facility] Provider Note   CSN: [id number] Arrival date & time: 05/25/23  1716     History  Chief Complaint  Patient presents with   Weakness    Alex Cortez is a 73 y.o. male.   Weakness    This patient is a 73 year old male, he has multiple medical problems, he is on Plavix, he is a diabetic on insulin, he also has a history of high cholesterol on rosuvastatin, he was seen by his doctor a couple of weeks ago and started on Bactrim for what appeared to be a possible scrotal cellulitis, he had significant edema and swelling of both the penis and the scrotum.  He did not tolerate the medication and was switched from Bactrim over to doxycycline, he finished that medication recently.  The significant other is the primary historian and reports that over the last 24 hours the patient has not gotten out of bed, he is not making any urine, he appears very weak and is now confused and not answering questions appropriately.  The patient is usually very active.  His blood pressure was noted to be severely depressed on arrival at 75/57.  Home Medications Prior to Admission medications   Medication Sig Start Date End Date Taking? Authorizing Provider  allopurinol (ZYLOPRIM) 300 MG tablet TAKE 1 TABLET BY MOUTH EVERY DAY 08/30/22   Namon Cirri E, PA-C  carvedilol (COREG) 12.5 MG tablet Take 1 tablet (12.5 mg total) by mouth 2 (two) times daily with a meal. 11/18/22   Rolly Salter, MD  clopidogrel (PLAVIX) 75 MG tablet Take 1 tablet (75 mg total) by mouth daily. 11/28/17   Ihor Austin, NP  Continuous Glucose Receiver (FREESTYLE LIBRE 3 READER) DEVI 1 Piece by Does not apply route once as needed for up to 1 dose. 12/13/22   Roma Kayser, MD  Continuous Glucose Sensor (FREESTYLE LIBRE 3 PLUS SENSOR) MISC Change sensor every 15 days. E11.65 04/19/23   Roma Kayser, MD  dapagliflozin propanediol (FARXIGA) 10 MG TABS tablet Take  1 tablet (10 mg total) by mouth daily with breakfast. 01/25/23   Nida, Denman George, MD  dapagliflozin propanediol (FARXIGA) 10 MG TABS tablet Take 1 tablet (10 mg total) by mouth daily before breakfast. 04/08/23   Nida, Denman George, MD  doxycycline (VIBRAMYCIN) 100 MG capsule Take 1 capsule (100 mg total) by mouth every 12 (twelve) hours for 10 days. 05/18/23 05/28/23  Donnita Falls, FNP  ezetimibe (ZETIA) 10 MG tablet Take 1 tablet (10 mg total) by mouth daily. 12/27/22   Antoine Poche, MD  insulin regular human CONCENTRATED (HUMULIN R U-500 KWIKPEN) 500 UNIT/ML KwikPen INJECT AS DIRECTED 60 UNITS WITH BREAKFAST, 60 UNITS WITH LUNCH, AND 60 UNITS WITH SUPPER FOR PRE-MEAL BLOOD GLUCOSE READINGS OF 90MG /DL OR ABOVE 10/14/93   Roma Kayser, MD  isosorbide mononitrate (IMDUR) 30 MG 24 hr tablet TAKE 1 TABLET BY MOUTH EVERY DAY 09/13/22   Antoine Poche, MD  levothyroxine (SYNTHROID) 75 MCG tablet TAKE 1 TABLET BY MOUTH EVERY DAY BEFORE BREAKFAST 04/08/23   Roma Kayser, MD  lidocaine-prilocaine (EMLA) cream Apply 1 Application topically as needed. Patient taking differently: Apply 1 Application topically daily as needed (port access). 04/29/22   Jaci Standard, MD  metFORMIN (GLUCOPHAGE) 500 MG tablet TAKE 1 TABLET BY MOUTH EVERY DAY WITH BREAKFAST 04/08/23   Roma Kayser, MD  nitroGLYCERIN (NITROSTAT) 0.4 MG SL tablet Place 1 tablet (  0.4 mg total) under the tongue every 5 (five) minutes as needed. 08/30/11   de Melanie Crazier, MD  ONE TOUCH ULTRA TEST test strip USE TO TEST BLOOD SUGAR FOUR TIMES DAILY E11.65 12/16/15   Roma Kayser, MD  rosuvastatin (CRESTOR) 40 MG tablet TAKE 1 TABLET BY MOUTH EVERY DAY 04/08/23   Antoine Poche, MD  tamsulosin (FLOMAX) 0.4 MG CAPS capsule Take 0.4 mg by mouth daily. 02/01/23   [provider]      Allergies    Ace inhibitors, Other, Rituxan [rituximab], and Shrimp [shellfish allergy]    Review of Systems    Review of Systems  Unable to perform ROS: Acuity of condition  Neurological:  Positive for weakness.    Physical Exam Updated Vital Signs BP 108/62   Pulse 60   Temp 98.6 F (37 C) (Oral)   Resp 16   Ht 1.905 m (6\' 3" )   Wt 112 kg   SpO2 97%   BMI 30.87 kg/m  Physical Exam Vitals and nursing note reviewed.  Constitutional:      General: He is in acute distress.     Appearance: He is well-developed. He is toxic-appearing.  HENT:     Head: Normocephalic and atraumatic.     Mouth/Throat:     Mouth: Mucous membranes are dry.     Pharynx: No oropharyngeal exudate.  Eyes:     General: No scleral icterus.       Right eye: No discharge.        Left eye: No discharge.     Conjunctiva/sclera: Conjunctivae normal.     Pupils: Pupils are equal, round, and reactive to light.  Neck:     Thyroid: No thyromegaly.     Vascular: No JVD.  Cardiovascular:     Rate and Rhythm: Normal rate and regular rhythm.     Heart sounds: Normal heart sounds. No murmur heard.    No friction rub. No gallop.  Pulmonary:     Effort: Pulmonary effort is normal. No respiratory distress.     Breath sounds: Normal breath sounds. No wheezing or rales.  Abdominal:     General: Bowel sounds are normal. There is no distension.     Palpations: Abdomen is soft. There is no mass.     Tenderness: There is no abdominal tenderness.  Genitourinary:    Comments: Genitourinary exam shows that his bilateral scrotum is severely enlarged, is penis is mildly edematous but the foreskin is able to completely retract revealing that the head of his penis is covered in smegma.  He has no inguinal lymphadenopathy and adequate pulses at the femoral arteries. Musculoskeletal:        General: No tenderness. Normal range of motion.     Cervical back: Normal range of motion and neck supple.     Right lower leg: No edema.     Left lower leg: No edema.  Lymphadenopathy:     Cervical: No cervical adenopathy.  Skin:    General:  Skin is warm and dry.     Findings: No erythema or rash.  Neurological:     Mental Status: He is alert.     Coordination: Coordination normal.     Comments: The patient is able to lift both legs and use both hands, he has weak grips, he has some difficulty doing finger-nose-finger bilaterally, seems confused but able to perform the requested exercises albeit very slowly  Psychiatric:        Behavior:  Behavior normal.     ED Results / Procedures / Treatments   Labs (all labs ordered are listed, but only abnormal results are displayed) Labs Reviewed  LACTIC ACID, PLASMA - Abnormal; Notable for the following components:      Result Value   Lactic Acid, Venous 2.2 (*)    All other components within normal limits  LACTIC ACID, PLASMA - Abnormal; Notable for the following components:   Lactic Acid, Venous 2.2 (*)    All other components within normal limits  COMPREHENSIVE METABOLIC PANEL - Abnormal; Notable for the following components:   Potassium 5.4 (*)    CO2 20 (*)    Glucose, Bld 212 (*)    BUN 34 (*)    Creatinine, Ser 1.79 (*)    Total Bilirubin 1.4 (*)    GFR, Estimated 40 (*)    All other components within normal limits  CBC WITH DIFFERENTIAL/PLATELET - Abnormal; Notable for the following components:   MCH 25.7 (*)    All other components within normal limits  APTT - Abnormal; Notable for the following components:   aPTT <22 (*)    All other components within normal limits  URINALYSIS, W/ REFLEX TO CULTURE (INFECTION SUSPECTED) - Abnormal; Notable for the following components:   Glucose, UA >=500 (*)    Protein, ur 30 (*)    All other components within normal limits  CBG MONITORING, ED - Abnormal; Notable for the following components:   Glucose-Capillary 177 (*)    All other components within normal limits  RESP PANEL BY RT-PCR (RSV, FLU A&B, COVID)  RVPGX2  CULTURE, BLOOD (ROUTINE X 2)  CULTURE, BLOOD (ROUTINE X 2)  PROTIME-INR  AMMONIA    EKG EKG  Interpretation Date/Time:  Wednesday May 25 2023 17:47:13 EDT Ventricular Rate:  76 PR Interval:  168 QRS Duration:  82 QT Interval:  404 QTC Calculation: 454 R Axis:   50  Text Interpretation: Normal sinus rhythm Normal ECG When compared with ECG of 16-Nov-2022 20:16, PREVIOUS ECG IS PRESENT Confirmed by Eber Hong ([id number]) on 05/25/2023 5:57:02 PM  Radiology CT Head Wo Contrast Result Date: 05/25/2023 CLINICAL DATA:  Mental status change EXAM: CT HEAD WITHOUT CONTRAST TECHNIQUE: Contiguous axial images were obtained from the base of the skull through the vertex without intravenous contrast. RADIATION DOSE REDUCTION: This exam was performed according to the departmental dose-optimization program which includes automated exposure control, adjustment of the mA and/or kV according to patient size and/or use of iterative reconstruction technique. COMPARISON:  CT 03/06/2018, MRI 08/02/2017 FINDINGS: Brain: No acute territorial infarction or hemorrhage is visualized. Interval mild ventricular enlargement compared to prior head CT but no significant progression of atrophy. Underlying chronic small vessel ischemic changes of the white matter but increased white matter hypodensity adjacent to the frontal and occipital horns. Small chronic appearing left capsular infarct extending into the white matter. Small chronic cerebellar infarcts. Possible small chronic right occipital infarct. Interval enlargement of left convexity slightly dense plaque like mass, measures about 6.3 cm AP x 17 cm transverse on series 2, image 13, previously 41 x 14 mm. No surrounding hypodense edema. No midline shift. Vascular: No hyperdense vessels.  Carotid vascular calcification Skull: Normal. Negative for fracture or focal lesion. Sinuses/Orbits: Mucosal thickening in the sinuses with small fluid level right sphenoid sinus Other: None IMPRESSION: 1. Negative for acute intracranial hemorrhage or midline shift. 2. Interval  enlargement of left convexity slightly dense extra-axial mass, measures about 6.3 cm AP x 17 cm  transverse, previously 41 x 14 mm. No surrounding hypodense edema. No midline shift. Findings previously characterized as probable meningioma 3. Interval mild ventricular enlargement compared to prior head CT but no significant progression of atrophy. Increased white matter hypodensity adjacent to the frontal and occipital horns, difficult to exclude normal pressure hydrocephalus with transependymal flow of CSF. Suggest further assessment with MRI. Left convexity extra-axial enlarged mass could also be evaluated at MRI follow-up. 4. Small chronic infarcts as described above. 5. Mucosal thickening in the sinuses with small fluid level right sphenoid sinus, correlate for acute sinusitis. Electronically Signed   By: Jasmine Pang M.D.   On: 05/25/2023 21:46   DG Chest Port 1 View Result Date: 05/25/2023 CLINICAL DATA:  Weakness. EXAM: PORTABLE CHEST 1 VIEW COMPARISON:  November 16, 2022. FINDINGS: The heart size and mediastinal contours are within normal limits. Both lungs are clear. The visualized skeletal structures are unremarkable. IMPRESSION: No active disease. Electronically Signed   By: Lupita Raider M.D.   On: 05/25/2023 20:56    Procedures Procedures    Medications Ordered in ED Medications  lactated ringers infusion ( Intravenous Not Given 05/25/23 1838)  sodium chloride 0.9 % bolus 1,000 mL (0 mLs Intravenous Stopped 05/25/23 1908)    Followed by  sodium chloride 0.9 % bolus 1,000 mL (0 mLs Intravenous Stopped 05/25/23 1912)    Followed by  0.9 %  sodium chloride infusion (1,000 mLs Intravenous New Bag/Given 05/25/23 2045)  cefTRIAXone (ROCEPHIN) 2 g in sodium chloride 0.9 % 100 mL IVPB (0 g Intravenous Stopped 05/25/23 1932)    ED Course/ Medical Decision Making/ A&P                                 Medical Decision Making Amount and/or Complexity of Data Reviewed Labs: ordered. Radiology:  ordered.  Risk Prescription drug management. Decision regarding hospitalization.    This patient presents to the ED for concern of acute illness with confusion and hypotension, this involves an extensive number of treatment options, and is a complaint that carries with it a high risk of complications and morbidity.  The differential diagnosis includes hypoglycemia, sepsis, stroke, coronary disease, liver failure, renal failure, severe dehydration   Co morbidities that complicate the patient evaluation  Diabetes, recent infection   Additional history obtained:  Additional history obtained from medical record External records from outside source obtained and reviewed including recent infection treated, reviewed notes from discharge paperwork from the patient's primary evaluation.  He did have a visit in the ER on February 21 and was seen by urology ultimately diagnosed with epididymoorchitis which is where he was prescribed antibiotics.  He is followed by endocrinology for his type 2 diabetes, otherwise was admitted with sepsis from pneumonia in September 2024, he is known to have diffuse large B-cell lymphoma with a Port-A-Cath in place, according to the medical history the patient is not currently undergoing treatment for this A scan in August 2024 showed no residual or recurrent disease, he completed 6 cycles of chemotherapy   Lab Tests:  I Ordered, and personally interpreted labs.  The pertinent results include: Acute kidney injury with a creatinine that has risen to 1.79, baseline is 1.1.  Potassium is slightly elevated at 5.4, CBC without leukocytosis or anemia, INR of 1.1, urinalysis with glucosuria but no signs of infection, COVID and flu negative   Imaging Studies ordered:  I ordered imaging studies including CT  scan of the head shows I independently visualized and interpreted imaging which showed likely enlarging meningioma, MRI recommended I agree with the radiologist  interpretation   Cardiac Monitoring: / EKG:  The patient was maintained on a cardiac monitor.  I personally viewed and interpreted the cardiac monitored which showed an underlying rhythm of: Normal sinus rhythm   Consultations Obtained:  I requested consultation with the neurology Dr. Mamie Nick,  and discussed lab and imaging findings as well as pertinent plan - they recommend: MRI and admission to the hospital service Discussed with Dr. Arville Care who will admit   Problem List / ED Course / Critical interventions / Medication management  This patient does not appear to be septic or toxic but has a persistent mild altered mental status starting to get better with IV fluids, there is an acute kidney injury but no signs of UTI, I have looked at his abdominal CT and there does not appear to be any significant abnormalities though it is a significant delay in getting this read.  Discussed with hospitalist for admission I ordered medication including IV fluids for hydration and Reevaluation of the patient after these medicines showed that the patient improved I have reviewed the patients home medicines and have made adjustments as needed   Social Determinants of Health:  elderly   Test / Admission - Considered:  admit         Final Clinical Impression(s) / ED Diagnoses Final diagnoses:  Altered mental status, unspecified altered mental status type  Acute kidney injury (HCC)  Dehydration     Eber Hong, MD 05/25/23 2249

## 2023-05-26 ENCOUNTER — Encounter (HOSPITAL_COMMUNITY): Payer: Self-pay | Admitting: Family Medicine

## 2023-05-26 ENCOUNTER — Observation Stay (HOSPITAL_COMMUNITY)

## 2023-05-26 DIAGNOSIS — C8330 Diffuse large B-cell lymphoma, unspecified site: Secondary | ICD-10-CM

## 2023-05-26 DIAGNOSIS — D329 Benign neoplasm of meninges, unspecified: Secondary | ICD-10-CM | POA: Diagnosis not present

## 2023-05-26 DIAGNOSIS — D320 Benign neoplasm of cerebral meninges: Secondary | ICD-10-CM | POA: Diagnosis not present

## 2023-05-26 DIAGNOSIS — R1900 Intra-abdominal and pelvic swelling, mass and lump, unspecified site: Secondary | ICD-10-CM | POA: Diagnosis not present

## 2023-05-26 DIAGNOSIS — I6782 Cerebral ischemia: Secondary | ICD-10-CM | POA: Diagnosis not present

## 2023-05-26 DIAGNOSIS — R220 Localized swelling, mass and lump, head: Secondary | ICD-10-CM | POA: Diagnosis not present

## 2023-05-26 DIAGNOSIS — I10 Essential (primary) hypertension: Secondary | ICD-10-CM

## 2023-05-26 DIAGNOSIS — N179 Acute kidney failure, unspecified: Secondary | ICD-10-CM | POA: Diagnosis not present

## 2023-05-26 DIAGNOSIS — Z8673 Personal history of transient ischemic attack (TIA), and cerebral infarction without residual deficits: Secondary | ICD-10-CM | POA: Diagnosis not present

## 2023-05-26 LAB — BASIC METABOLIC PANEL
Anion gap: 11 (ref 5–15)
BUN: 35 mg/dL — ABNORMAL HIGH (ref 8–23)
CO2: 20 mmol/L — ABNORMAL LOW (ref 22–32)
Calcium: 9 mg/dL (ref 8.9–10.3)
Chloride: 110 mmol/L (ref 98–111)
Creatinine, Ser: 1.34 mg/dL — ABNORMAL HIGH (ref 0.61–1.24)
GFR, Estimated: 56 mL/min — ABNORMAL LOW (ref >60)
Glucose, Bld: 139 mg/dL — ABNORMAL HIGH (ref 70–99)
Potassium: 4.5 mmol/L (ref 3.5–5.1)
Sodium: 141 mmol/L (ref 135–145)

## 2023-05-26 LAB — CBC
HCT: 38.9 % — ABNORMAL LOW (ref 39.0–52.0)
Hemoglobin: 12.3 g/dL — ABNORMAL LOW (ref 13.0–17.0)
MCH: 26 pg (ref 26.0–34.0)
MCHC: 31.6 g/dL (ref 30.0–36.0)
MCV: 82.2 fL (ref 80.0–100.0)
Platelets: 189 10*3/uL (ref 150–400)
RBC: 4.73 MIL/uL (ref 4.22–5.81)
RDW: 14.7 % (ref 11.5–15.5)
WBC: 5.5 10*3/uL (ref 4.0–10.5)
nRBC: 0 % (ref 0.0–0.2)

## 2023-05-26 LAB — GLUCOSE, CAPILLARY
Glucose-Capillary: 138 mg/dL — ABNORMAL HIGH (ref 70–99)
Glucose-Capillary: 192 mg/dL — ABNORMAL HIGH (ref 70–99)
Glucose-Capillary: 196 mg/dL — ABNORMAL HIGH (ref 70–99)
Glucose-Capillary: 157 mg/dL — ABNORMAL HIGH (ref 70–99)

## 2023-05-26 LAB — HEMOGLOBIN A1C
Hgb A1c MFr Bld: 8.3 % — ABNORMAL HIGH (ref 4.8–5.6)
Mean Plasma Glucose: 191.51 mg/dL

## 2023-05-26 MED ORDER — LIDOCAINE-PRILOCAINE 2.5-2.5 % EX CREA: 1.0000 | TOPICAL_CREAM | Freq: Every day | CUTANEOUS | Status: DC | PRN

## 2023-05-26 MED ORDER — SODIUM CHLORIDE 0.9 % IV SOLN: INTRAVENOUS | Status: DC

## 2023-05-26 MED ORDER — ACETAMINOPHEN 325 MG PO TABS
650.0000 mg | ORAL_TABLET | Freq: Four times a day (QID) | ORAL | Status: DC | PRN
  Administered 2023-05-28 – 2023-05-30 (×3): 650 mg via ORAL
  Filled 2023-05-26 (×3): qty 2

## 2023-05-26 MED ORDER — DAPAGLIFLOZIN PROPANEDIOL 10 MG PO TABS: 10.0000 mg | ORAL_TABLET | Freq: Every day | ORAL | Status: DC

## 2023-05-26 MED ORDER — ROSUVASTATIN CALCIUM 10 MG PO TABS
40.0000 mg | ORAL_TABLET | Freq: Every day | ORAL | Status: DC
  Administered 2023-05-26 – 2023-06-03 (×9): 40 mg via ORAL
  Filled 2023-05-26 (×4): qty 4
  Filled 2023-05-26: qty 8
  Filled 2023-05-26 (×2): qty 4
  Filled 2023-05-26: qty 2
  Filled 2023-05-26: qty 4

## 2023-05-26 MED ORDER — SODIUM CHLORIDE 0.9 % IV SOLN: 2.0000 g | INTRAVENOUS | Status: DC

## 2023-05-26 MED ORDER — INSULIN ASPART 100 UNIT/ML IJ SOLN: 0.0000 [IU] | Freq: Three times a day (TID) | INTRAMUSCULAR | Status: DC

## 2023-05-26 MED ORDER — NITROGLYCERIN 0.4 MG SL SUBL: 0.4000 mg | SUBLINGUAL_TABLET | SUBLINGUAL | Status: DC | PRN

## 2023-05-26 MED ORDER — GADOBUTROL 1 MMOL/ML IV SOLN
10.0000 mL | Freq: Once | INTRAVENOUS | Status: AC | PRN
  Administered 2023-05-26: 10 mL via INTRAVENOUS

## 2023-05-26 MED ORDER — CLOPIDOGREL BISULFATE 75 MG PO TABS
75.0000 mg | ORAL_TABLET | Freq: Every day | ORAL | Status: DC
  Filled 2023-05-26: qty 1

## 2023-05-26 MED ORDER — ALLOPURINOL 300 MG PO TABS
300.0000 mg | ORAL_TABLET | Freq: Every day | ORAL | Status: DC
  Administered 2023-05-26 – 2023-06-05 (×11): 300 mg via ORAL
  Filled 2023-05-26 (×11): qty 1
  Filled 2023-05-26: qty 3

## 2023-05-26 MED ORDER — EZETIMIBE 10 MG PO TABS
10.0000 mg | ORAL_TABLET | Freq: Every day | ORAL | Status: DC
  Administered 2023-05-26 – 2023-06-03 (×9): 10 mg via ORAL
  Filled 2023-05-26 (×9): qty 1

## 2023-05-26 MED ORDER — INSULIN REGULAR HUMAN (CONC) 500 UNIT/ML ~~LOC~~ SOPN: 60.0000 [IU] | PEN_INJECTOR | SUBCUTANEOUS | Status: DC

## 2023-05-26 MED ORDER — INSULIN ASPART 100 UNIT/ML IJ SOLN
0.0000 [IU] | Freq: Every day | INTRAMUSCULAR | Status: DC
Start: 2023-05-26 — End: 2023-05-26

## 2023-05-26 MED ORDER — CARVEDILOL 12.5 MG PO TABS
12.5000 mg | ORAL_TABLET | Freq: Two times a day (BID) | ORAL | Status: DC
  Administered 2023-05-26 – 2023-05-27 (×3): 12.5 mg via ORAL
  Filled 2023-05-26 (×2): qty 1
  Filled 2023-05-26: qty 4

## 2023-05-26 MED ORDER — ENOXAPARIN SODIUM 40 MG/0.4ML IJ SOSY
40.0000 mg | PREFILLED_SYRINGE | INTRAMUSCULAR | Status: DC
  Filled 2023-05-26: qty 0.4

## 2023-05-26 MED ORDER — LEVOTHYROXINE SODIUM 75 MCG PO TABS
75.0000 ug | ORAL_TABLET | Freq: Every day | ORAL | Status: DC
  Administered 2023-05-26 – 2023-06-06 (×11): 75 ug via ORAL
  Filled 2023-05-26 (×11): qty 1

## 2023-05-26 MED ORDER — MAGNESIUM HYDROXIDE 400 MG/5ML PO SUSP: 30.0000 mL | Freq: Every day | ORAL | Status: DC | PRN

## 2023-05-26 MED ORDER — INSULIN ASPART 100 UNIT/ML IJ SOLN
0.0000 [IU] | INTRAMUSCULAR | Status: DC
  Administered 2023-05-26 (×2): 3 [IU] via SUBCUTANEOUS
  Administered 2023-05-26 (×2): 4 [IU] via SUBCUTANEOUS
  Administered 2023-05-27: 3 [IU] via SUBCUTANEOUS
  Administered 2023-05-28 – 2023-05-29 (×3): 4 [IU] via SUBCUTANEOUS
  Administered 2023-05-29: 7 [IU] via SUBCUTANEOUS
  Administered 2023-05-30 – 2023-05-31 (×3): 3 [IU] via SUBCUTANEOUS

## 2023-05-26 MED ORDER — ONDANSETRON HCL 4 MG/2ML IJ SOLN: 4.0000 mg | Freq: Four times a day (QID) | INTRAMUSCULAR | Status: DC | PRN

## 2023-05-26 MED ORDER — ISOSORBIDE MONONITRATE ER 30 MG PO TB24
30.0000 mg | ORAL_TABLET | Freq: Every day | ORAL | Status: DC
  Administered 2023-05-26 – 2023-05-27 (×2): 30 mg via ORAL
  Filled 2023-05-26 (×2): qty 1

## 2023-05-26 MED ORDER — TAMSULOSIN HCL 0.4 MG PO CAPS
0.4000 mg | ORAL_CAPSULE | Freq: Every day | ORAL | Status: DC
  Administered 2023-05-26 – 2023-06-05 (×11): 0.4 mg via ORAL
  Filled 2023-05-26 (×12): qty 1

## 2023-05-26 MED ORDER — DAPAGLIFLOZIN PROPANEDIOL 10 MG PO TABS
10.0000 mg | ORAL_TABLET | Freq: Every day | ORAL | Status: DC
  Administered 2023-05-26: 10 mg via ORAL
  Filled 2023-05-26: qty 1

## 2023-05-26 MED ORDER — ACETAMINOPHEN 650 MG RE SUPP: 650.0000 mg | Freq: Four times a day (QID) | RECTAL | Status: DC | PRN

## 2023-05-26 MED ORDER — ONDANSETRON HCL 4 MG PO TABS: 4.0000 mg | ORAL_TABLET | Freq: Four times a day (QID) | ORAL | Status: DC | PRN

## 2023-05-26 MED ORDER — TRAZODONE HCL 50 MG PO TABS
25.0000 mg | ORAL_TABLET | Freq: Every evening | ORAL | Status: DC | PRN
  Administered 2023-05-27 – 2023-06-02 (×6): 25 mg via ORAL
  Filled 2023-05-26 (×6): qty 1

## 2023-05-26 NOTE — [place] FL2 (Signed)
 [place] MEDICAID FL2 LEVEL OF CARE FORM     IDENTIFICATION  Patient Name: Alex Cortez Birthdate: 10-05-1950 Sex: male Admission Date (Current Location): 05/25/2023  [facility] and [place] Number:  [employer] and Address:  [facility],  [address], [place] [address]      Provider Number: [id number]  Attending Physician Name and Address:  Carollee Herter, DO  Relative Name and Phone Number:  Trelyn Vanderlinde (spouse) [phone]    Current Level of Care: Hospital Recommended Level of Care: Skilled Nursing Facility Prior Approval Number:    Date Approved/Denied:   PASRR Number: pending  Discharge Plan: SNF    Current Diagnoses: Patient Active Problem List   Diagnosis Date Noted   Meningioma (HCC) 05/26/2023   Pelvic mass 05/26/2023   AKI (acute kidney injury) (HCC) 05/25/2023   Chronic diastolic CHF (congestive heart failure) (HCC) 11/17/2022   Coronary artery disease 11/17/2022   Insulin long-term use (HCC) 08/10/2022   Abscess of right groin 07/12/2022   Port-A-Cath in place 05/05/2022   DLBCL (diffuse large B cell lymphoma) (HCC) 04/23/2022   Class 2 severe obesity due to excess calories with serious comorbidity and body mass index (BMI) of 35.0 to 35.9 in adult [facility]) 12/28/2021   Hypothyroidism 01/12/2018   Cerebral embolism with cerebral infarction 08/01/2017   CVA (cerebral vascular accident) (HCC) 08/01/2017   History of CVA with residual deficit    Essential hypertension    Type 2 diabetes mellitus with obesity (HCC)    Class 1 obesity due to excess calories with serious comorbidity and body mass index (BMI) of 32.0 to 32.9 in adult 09/09/2015   Sedentary lifestyle 09/09/2015   Mixed hyperlipidemia 02/24/2015   Left pontine stroke (HCC) 10/23/2012   Exertional dyspnea 10/18/2012   Diastolic heart failure (HCC) 10/01/2011   Obstructive sleep apnea 09/02/2011   LV dysfunction 09/02/2011   ERECTILE DYSFUNCTION, SECONDARY TO  MEDICATION 04/14/2010   Type 2 diabetes mellitus with vascular disease (HCC) 10/02/2009   CORONARY ATHEROSCLEROSIS NATIVE CORONARY ARTERY 10/02/2009    Orientation RESPIRATION BLADDER Height & Weight     Self  Normal Incontinent Weight: 227 lb 4.7 oz (103.1 kg) Height:  6\' 3"  (190.5 cm)  BEHAVIORAL SYMPTOMS/MOOD NEUROLOGICAL BOWEL NUTRITION STATUS      Continent Diet (See Dc summary)  AMBULATORY STATUS COMMUNICATION OF NEEDS Skin   Extensive Assist Verbally Skin abrasions (R lower leg)                       Personal Care Assistance Level of Assistance  Bathing, Feeding, Dressing Bathing Assistance: Maximum assistance Feeding assistance: Independent Dressing Assistance: Maximum assistance     Functional Limitations Info  Sight, Hearing, Speech Sight Info: Impaired Hearing Info: Adequate Speech Info: Adequate    SPECIAL CARE FACTORS FREQUENCY  PT (By licensed PT)     PT Frequency: 5 x a week              Contractures Contractures Info: Not present    Additional Factors Info  Code Status, Allergies Code Status Info: FULL Allergies Info: [place] Inhibitors, other, Rituxan (rituximab), and Shrimp ( shellfish allergy)           Current Medications (05/26/2023):  This is the current hospital active medication list Current Facility-Administered Medications  Medication Dose Route Frequency Provider Last Rate Last Admin   0.9 %  sodium chloride infusion   Intravenous Continuous Mansy, Jan A, MD 100 mL/hr at 05/26/23  0100 New Bag at 05/26/23 0100   acetaminophen (TYLENOL) tablet 650 mg  650 mg Oral Q6H PRN Mansy, Jan A, MD       Or   acetaminophen (TYLENOL) suppository 650 mg  650 mg Rectal Q6H PRN Mansy, Jan A, MD       allopurinol (ZYLOPRIM) tablet 300 mg  300 mg Oral Daily Mansy, Jan A, MD   300 mg at 05/26/23 0857   carvedilol (COREG) tablet 12.5 mg  12.5 mg Oral BID WC Mansy, Jan A, MD   12.5 mg at 05/26/23 0857   cefTRIAXone (ROCEPHIN) 2 g in sodium chloride 0.9  % 100 mL IVPB  2 g Intravenous Q24H Mansy, Jan A, MD       dapagliflozin propanediol (FARXIGA) tablet 10 mg  10 mg Oral QAC breakfast Mansy, Jan A, MD   10 mg at 05/26/23 1610   ezetimibe (ZETIA) tablet 10 mg  10 mg Oral Daily Mansy, Jan A, MD   10 mg at 05/26/23 0851   insulin aspart (novoLOG) injection 0-20 Units  0-20 Units Subcutaneous Q4H Carollee Herter, DO   3 Units at 05/26/23 0854   isosorbide mononitrate (IMDUR) 24 hr tablet 30 mg  30 mg Oral Daily Mansy, Jan A, MD   30 mg at 05/26/23 9604   levothyroxine (SYNTHROID) tablet 75 mcg  75 mcg Oral Q0600 Mansy, Jan A, MD   75 mcg at 05/26/23 0534   lidocaine-prilocaine (EMLA) cream 1 Application  1 Application Topical Daily PRN Mansy, Jan A, MD       magnesium hydroxide (MILK OF MAGNESIA) suspension 30 mL  30 mL Oral Daily PRN Mansy, Jan A, MD       nitroGLYCERIN (NITROSTAT) SL tablet 0.4 mg  0.4 mg Sublingual Q5 min PRN Mansy, Jan A, MD       ondansetron [facility]) tablet 4 mg  4 mg Oral Q6H PRN Mansy, Jan A, MD       Or   ondansetron [facility]) injection 4 mg  4 mg Intravenous Q6H PRN Mansy, Jan A, MD       rosuvastatin (CRESTOR) tablet 40 mg  40 mg Oral Daily Mansy, Jan A, MD   40 mg at 05/26/23 0851   tamsulosin (FLOMAX) capsule 0.4 mg  0.4 mg Oral Daily Mansy, Jan A, MD   0.4 mg at 05/26/23 0851   traZODone (DESYREL) tablet 25 mg  25 mg Oral QHS PRN Mansy, Vernetta Honey, MD         Discharge Medications: Please see discharge summary for a list of discharge medications.  Relevant Imaging Results:  Relevant Lab Results:   Additional Information SSN: [id number]  Isabella Bowens, [place]

## 2023-05-26 NOTE — Assessment & Plan Note (Addendum)
 On admission. - This is enlarging and could be contributing to his nausea and vomiting. - Neurology consultation can be obtained and called in a.m. - Will defer steroid therapy for his meningioma for the neurologist.  05-26-2023 no headache or N/V at this time.  MRI brain shows "Abnormal leptomeningeal enhancement which could be infectious or lymphomatous meningitis." Will ask neurology for input

## 2023-05-26 NOTE — Evaluation (Signed)
 Speech Language Pathology Evaluation Patient Details Name: Alex Cortez MRN: [id number] DOB: [birth year] Today's Date: 05/26/2023 Time: 1335-1400 SLP Time Calculation (min) (ACUTE ONLY): 25 min  Problem List:  Patient Active Problem List   Diagnosis Date Noted   Meningioma (HCC) 05/26/2023   Pelvic mass 05/26/2023   AKI (acute kidney injury) (HCC) 05/25/2023   Chronic diastolic CHF (congestive heart failure) (HCC) 11/17/2022   Coronary artery disease 11/17/2022   Insulin long-term use (HCC) 08/10/2022   Port-A-Cath in place 05/05/2022   DLBCL (diffuse large B cell lymphoma) (HCC) 04/23/2022   Hypothyroidism 01/12/2018   History of CVA with residual deficit    Essential hypertension    Type 2 diabetes mellitus with obesity (HCC)    Sedentary lifestyle 09/09/2015   Mixed hyperlipidemia 02/24/2015   Exertional dyspnea 10/18/2012   Diastolic heart failure (HCC) 10/01/2011   Obstructive sleep apnea 09/02/2011   LV dysfunction 09/02/2011   ERECTILE DYSFUNCTION, SECONDARY TO MEDICATION 04/14/2010   Type 2 diabetes mellitus with vascular disease (HCC) 10/02/2009   CORONARY ATHEROSCLEROSIS NATIVE CORONARY ARTERY 10/02/2009   Past Medical History:  Past Medical History:  Diagnosis Date   CAD (coronary artery disease)    nonobstructive CAD/patent LAD stent site; EF 65%, 08/2011; NSTEMI/DES LAD, 6/20 11   Cerebral embolism with cerebral infarction 08/01/2017   Chest pain    CVA (cerebral vascular accident) (HCC) 08/01/2017   Diabetes mellitus    insulin-dependent   Dyslipidemia    HTN (hypertension)    Hypothyroidism    Left pontine stroke (HCC) 10/23/2012   Myocardial infarction (HCC) 2011   Post MI syndrome (HCC) 08/29/2009   Stroke (HCC) 10/22/2012   Left inferior pons   Past Surgical History:  Past Surgical History:  Procedure Laterality Date   COLONOSCOPY  08/2021   CORONARY ANGIOPLASTY WITH STENT PLACEMENT  2011   IR IMAGING GUIDED PORT INSERTION  04/26/2022   IR  REMOVAL TUN ACCESS W/ PORT W/O FL MOD SED  01/20/2023   LYMPH NODE BIOPSY Right 04/14/2022   Procedure: RIGHT INGUINAL EXCISIONAL LYMPH NODE BIOPSY;  Surgeon: Fritzi Mandes, MD;  Location: [facility];  Service: General;  Laterality: Right;   HPI:  Alex Cortez is a 73 y.o. male with medical history significant for coronary artery disease, type  2 diabetes mellitus, dyslipidemia, essential hypertension, CVA and coronary artery disease, who presented to the emergency room with a Kalisetti of lower abdominal pain with associated nausea and vomiting without diarrhea.  No bilious vomitus or hematemesis.  No dysuria, oliguria or hematuria or flank pain.  No fever or chills.  No cough or wheezing or dyspnea.  No chest pain or palpitations.  He has been having altered mental status with mild confusion.  He denied any paresthesias or focal muscle weakness.  No witnessed seizures.  No urinary or stool incontinence.  He was seen on outpatient basis a couple weeks ago and started on p.o. Bactrim for suspected scrotal cellulitis.  He has been having scrotal and penile edema over the last several weeks.  When he did not tolerate Bactrim he was switched to p.o doxycycline and he recently finished it.  He has not gotten out of bed over the last 24 hours and has been having significant oliguria and feeling very weak.  He was initially unable to answer questions appropriately and was more appropriate during my interview.  No fever or chills. Abnormal leptomeningeal enhancement which could be infectious or   lymphomatous meningitis.   2.  13 mm area of enhancement in the inferior left cerebellum has   signal characteristics seen in both subacute infarct and CNS   lymphoma. Consider CSF analysis.   3. Large meningioma along the left frontal convexity, slowly growing  since at least 2019. SLE requested.   Assessment / Plan / Recommendation Clinical Impression  Pt presents with intermittent confusion and impaired attention and  impulsivity at this time, which his mother reports is not his baseline. At baseline, he is able to drive and manage comunication and ADL's independently. Pt required frequent redirection to task during assessment and exhibited some confabulation at times. Recommend ongoing SLP f/u at next venue of care if Pt's confusion does not dissipate. He will need supervion at this time due to confusion and impulsivity (attempting to get out of chair and bed).    SLP Assessment  SLP Recommendation/Assessment: Patient needs continued Speech Lanaguage Pathology Services SLP Visit Diagnosis: Cognitive communication deficit (R41.841)    Recommendations for follow up therapy are one component of a multi-disciplinary discharge planning process, led by the attending physician.  Recommendations may be updated based on patient status, additional functional criteria and insurance authorization.    Follow Up Recommendations  Skilled nursing-short term rehab (<3 hours/day)    Assistance Recommended at Discharge  Frequent or constant Supervision/Assistance  Functional Status Assessment Patient has had a recent decline in their functional status and demonstrates the ability to make significant improvements in function in a reasonable and predictable amount of time.  Frequency and Duration min 2x/week  1 week      SLP Evaluation Cognition  Overall Cognitive Status: Impaired/Different from baseline Arousal/Alertness: Awake/alert Orientation Level: Oriented to person;Oriented to place;Disoriented to time;Oriented to situation Year: 2025 Month: August Day of Week: Correct Attention: Sustained Sustained Attention: Impaired Sustained Attention Impairment: Verbal complex Memory: Impaired Memory Impairment:  (intermittent confusion at the moment) Awareness: Impaired Awareness Impairment: Emergent impairment Problem Solving: Impaired Problem Solving Impairment: Verbal complex;Functional complex Executive Function:  Self Monitoring;Sequencing Sequencing: Impaired Sequencing Impairment: Verbal complex Self Monitoring: Impaired Self Monitoring Impairment: Verbal complex Behaviors: Restless;Impulsive Safety/Judgment: Impaired Comments: Pt was trying to climb out of recliner upon SLP arrival       Comprehension  Auditory Comprehension Overall Auditory Comprehension: Appears within functional limits for tasks assessed Yes/No Questions: Within Functional Limits Commands: Within Functional Limits Conversation: Simple Interfering Components: Attention;Working Radio broadcast assistant: Diplomatic Services operational officer Discrimination: Within [employer] Reading Comprehension Reading Status: Not tested    Expression Expression Primary Mode of Expression: Verbal Verbal Expression Overall Verbal Expression: Appears within functional limits for tasks assessed Initiation: No impairment Automatic Speech: Name;Social Response Level of Generative/Spontaneous Verbalization: Conversation Repetition: No impairment Naming: No impairment Pragmatics: No impairment Interfering Components: Attention Non-Verbal Means of Communication: Not applicable Written Expression Dominant Hand: Right Written Expression: Not tested   Oral / Motor  Oral Motor/Sensory Function Overall Oral Motor/Sensory Function: Within functional limits Motor Speech Overall Motor Speech: Appears within functional limits for tasks assessed Respiration: Within functional limits Phonation: Normal Resonance: Within functional limits Articulation: Within functional limitis Intelligibility: Intelligible Motor Planning: Witnin functional limits Motor Speech Errors: Not applicable           Thank you,  Havery Moros, CCC-SLP [phone]  Tito Ausmus 05/26/2023, 2:32 PM

## 2023-05-26 NOTE — Plan of Care (Signed)
  Problem: Clinical Measurements: Goal: Respiratory complications will improve Outcome: Progressing   Problem: Education: Goal: Knowledge of General Education information will improve Description: Including pain rating scale, medication(s)/side effects and non-pharmacologic comfort measures Outcome: Not Progressing   Problem: Clinical Measurements: Goal: Diagnostic test results will improve Outcome: Not Progressing   Problem: Activity: Goal: Risk for activity intolerance will decrease Outcome: Not Progressing

## 2023-05-26 NOTE — Evaluation (Signed)
 Occupational Therapy Evaluation Patient Details Name: Alex Cortez MRN: [id number] DOB: 18-Feb-1951 Today's Date: 05/26/2023   History of Present Illness   Alex Cortez is a 73 y.o. male with medical history significant for coronary artery disease, type  2 diabetes mellitus, dyslipidemia, essential hypertension, CVA and coronary artery disease, who presented to the emergency room with a Kalisetti of lower abdominal pain with associated nausea and vomiting without diarrhea.  No bilious vomitus or hematemesis.  No dysuria, oliguria or hematuria or flank pain.  No fever or chills.  No cough or wheezing or dyspnea.  No chest pain or palpitations.  He has been having altered mental status with mild confusion.  He denied any paresthesias or focal muscle weakness.  No witnessed seizures.  No urinary or stool incontinence.  He was seen on outpatient basis a couple weeks ago and started on p.o. Bactrim for suspected scrotal cellulitis.  He has been having scrotal and penile edema over the last several weeks.  When he did not tolerate Bactrim he was switched to p.o doxycycline and he recently finished it.  He has not gotten out of bed over the last 24 hours and has been having significant oliguria and feeling very weak.  He was initially unable to answer questions appropriately and was more appropriate during my interview.  No fever or chills..     Clinical Impressions Pt agreeable to OT and PT co-evaluation. Pt's cognition and direction following appeared impaired. Wife reports inability to care for pt in his current condition. Pt required mod A for mobility with cognitive impairments being a contributing factor. Pt was unable to complete lower body dressing seated in the chair. Pt was weak with slow labored movement. Pt left in the chair with chair alarm set and call bell within reach. Family present in the room as well. Pt will benefit from continued OT in the hospital and recommended venue below to  increase strength, balance, and endurance for safe ADL's.        If plan is discharge home, recommend the following:   A lot of help with walking and/or transfers;A lot of help with bathing/dressing/bathroom;Assistance with cooking/housework;Assist for transportation;Help with stairs or ramp for entrance;Direct supervision/assist for medications management     Functional Status Assessment   Patient has had a recent decline in their functional status and demonstrates the ability to make significant improvements in function in a reasonable and predictable amount of time.       Precautions/Restrictions   Precautions Precautions: Fall Recall of Precautions/Restrictions: Impaired Restrictions Weight Bearing Restrictions Per Provider Order: No     Mobility Bed Mobility Overal bed mobility: Needs Assistance Bed Mobility: Supine to Sit     Supine to sit: Min assist, Contact guard     General bed mobility comments: labored movement    Transfers Overall transfer level: Needs assistance Equipment used: Rolling walker (2 wheels) Transfers: Sit to/from Stand, Bed to chair/wheelchair/BSC Sit to Stand: Mod assist     Step pivot transfers: Mod assist     General transfer comment: unsteady; verbal cues      Balance Overall balance assessment: Needs assistance Sitting-balance support: No upper extremity supported, Feet supported Sitting balance-Leahy Scale: Fair Sitting balance - Comments: seated at EOB Postural control: Posterior lean Standing balance support: Bilateral upper extremity supported, During functional activity Standing balance-Leahy Scale: Fair Standing balance comment: poor to fair with RW  ADL either performed or assessed with clinical judgement   ADL Overall ADL's : Needs assistance/impaired     Grooming: Set up;Sitting   Upper Body Bathing: Set up;Sitting   Lower Body Bathing: Maximal  assistance;Sitting/lateral leans   Upper Body Dressing : Set up;Sitting   Lower Body Dressing: Maximal assistance;Sitting/lateral leans Lower Body Dressing Details (indicate cue type and reason): Pt unable to attempt today. Reports wife helps at home. Toilet Transfer: Moderate assistance;Stand-pivot;Ambulation;Rolling walker (2 wheels) Toilet Transfer Details (indicate cue type and reason): Simulated chair to EOB and back as well as ambulation in the room. Toileting- Clothing Manipulation and Hygiene: Moderate assistance;Sitting/lateral lean;Maximal assistance       Functional mobility during ADLs: Moderate assistance;Rolling walker (2 wheels) General ADL Comments: unateady and labored ambulation in the room     Vision Baseline Vision/History: 1 Wears glasses Ability to See in Adequate Light: 1 Impaired Patient Visual Report: No change from baseline Vision Assessment?: No apparent visual deficits     Perception Perception: Not tested       Praxis Praxis: Not tested       Pertinent Vitals/Pain Pain Assessment Pain Assessment: No/denies pain     Extremity/Trunk Assessment Upper Extremity Assessment Upper Extremity Assessment: Generalized weakness   Lower Extremity Assessment Lower Extremity Assessment: Defer to PT evaluation   Cervical / Trunk Assessment Cervical / Trunk Assessment: Kyphotic   Communication Communication Communication: No apparent difficulties   Cognition Arousal: Alert Behavior During Therapy: WFL for tasks assessed/performed Cognition: Cognition impaired             OT - Cognition Comments: Pt requent frequent cuing for mobility. Lack of insight into current function.                 Following commands: Impaired Following commands impaired: Follows one step commands inconsistently     Cueing  General Comments   Cueing Techniques: Verbal cues;Tactile cues                 Home Living Family/patient expects to be  discharged to:: Private residence Living Arrangements: Spouse/significant other Available Help at Discharge: Family Type of Home: House Home Access: Stairs to enter [employer] of Steps: 4 Entrance Stairs-Rails: Right Home Layout: Two level;Full bath on main level Alternate Level Stairs-Number of Steps: 13 to 2nd floor with right side rail, 10 to basement with bilateral side rails Alternate Level Stairs-Rails: Right Bathroom Shower/Tub: Producer, television/film/video: Standard Bathroom Accessibility: Yes   Home Equipment: Cane - single point;Shower seat - built Charity fundraiser (2 wheels)          Prior Functioning/Environment Prior Level of Function : Needs assist  Cognitive Assist : Mobility (cognitive);ADLs (cognitive) Mobility (Cognitive): Intermittent cues ADLs (Cognitive): Intermittent cues Physical Assist : Mobility (physical);ADLs (physical) Mobility (physical): Bed mobility;Transfers;Gait;Stairs ADLs (physical): Bathing;Dressing;Toileting;IADLs Mobility Comments: Tourist information centre manager, drives ADLs Comments: Wife reports she has had to help with ADL's.    OT Problem List: Decreased strength;Decreased activity tolerance;Impaired balance (sitting and/or standing);Decreased cognition;Decreased safety awareness;Decreased knowledge of use of DME or AE;Decreased knowledge of precautions   OT Treatment/Interventions: Self-care/ADL training;Therapeutic exercise;DME and/or AE instruction;Therapeutic activities;Patient/family education;Balance training      OT Goals(Current goals can be found in the care plan section)   Acute Rehab OT Goals Patient Stated Goal: Improve function/get stronger. OT Goal Formulation: With patient Time For Goal Achievement: 06/09/23 Potential to Achieve Goals: Good   OT Frequency:  Min 2X/week    Co-evaluation PT/OT/SLP Co-Evaluation/Treatment: Yes  Reason for Co-Treatment: To address functional/ADL transfers PT goals addressed  during session: Mobility/safety with mobility;Balance;Strengthening/ROM OT goals addressed during session: ADL's and self-care      AM-PAC OT "6 Clicks" Daily Activity     Outcome Measure Help from another person eating meals?: None Help from another person taking care of personal grooming?: A Little Help from another person toileting, which includes using toliet, bedpan, or urinal?: A Lot Help from another person bathing (including washing, rinsing, drying)?: A Lot Help from another person to put on and taking off regular upper body clothing?: A Little Help from another person to put on and taking off regular lower body clothing?: A Lot 6 Click Score: 16   End of Session Equipment Utilized During Treatment: Rolling walker (2 wheels);Gait belt  Activity Tolerance: Patient tolerated treatment well Patient left: in chair;with call bell/phone within reach;with chair alarm set;with family/visitor present  OT Visit Diagnosis: Unsteadiness on feet (R26.81);Other abnormalities of gait and mobility (R26.89);Muscle weakness (generalized) (M62.81);History of falling (Z91.81);Other symptoms and signs involving cognitive function                Time: 1610-9604 OT Time Calculation (min): 15 min Charges:  OT General Charges $OT Visit: 1 Visit OT Evaluation $OT Eval Low Complexity: 1 Low  Shanena Pellegrino OT, MOT   Danie Chandler 05/26/2023, 11:27 AM

## 2023-05-26 NOTE — Progress Notes (Signed)
 PROGRESS NOTE    Alex Cortez  WGN:[id number] DOB: [birth year] DOA: 05/25/2023 PCP: Juliette Alcide, MD  Subjective: Pt seen and examined. Met with wife at bedside. Pt feeling some better. No fevers. No headache. No neck pain.   Hospital Course: HPI: Alex Cortez is a 73 y.o. male with medical history significant for coronary artery disease, type  2 diabetes mellitus, dyslipidemia, essential hypertension, CVA and coronary artery disease, who presented to the emergency room with a [place] of lower abdominal pain with associated nausea and vomiting without diarrhea.  No bilious vomitus or hematemesis.  No dysuria, oliguria or hematuria or flank pain.  No fever or chills.  No cough or wheezing or dyspnea.  No chest pain or palpitations.  He has been having altered mental status with mild confusion.  He denied any paresthesias or focal muscle weakness.  No witnessed seizures.  No urinary or stool incontinence.  He was seen on outpatient basis a couple weeks ago and started on p.o. Bactrim for suspected scrotal cellulitis.  He has been having scrotal and penile edema over the last several weeks.  When he did not tolerate Bactrim he was switched to p.o doxycycline and he recently finished it.  He has not gotten out of bed over the last 24 hours and has been having significant oliguria and feeling very weak.  He was initially unable to answer questions appropriately and was more appropriate during my interview.  No fever or chills..   Significant Events: Admitted 05/25/2023 for AKI   Significant Labs: WBC 6.1, HgB 13.6, plt 244 Na 137, K 5.4, CO2 of 20, BUN 34, Scr 1.79, glu 212 Lactic acid 2.2  Significant Imaging Studies: CXR No active disease.  CT head Negative for acute intracranial hemorrhage or midline shift. 2. Interval enlargement of left convexity slightly dense extra-axial mass, measures about 6.3 cm AP x 17 cm transverse, previously 41 x 14 mm. No surrounding hypodense edema. No  midline shift. Findings previously characterized as probable meningioma 3. Interval mild ventricular enlargement compared to prior head CT but no significant progression of atrophy. Increased white matter hypodensity adjacent to the frontal and occipital horns, difficult to exclude normal pressure hydrocephalus with transependymal flow of CSF. Suggest further assessment with MRI. Left convexity extra-axial enlarged mass could also be evaluated at MRI follow-up. 4. Small chronic infarcts as described above. 5. Mucosal thickening in the sinuses with small fluid level right sphenoid sinus, correlate for acute sinusitis. CT abd/pelvis Progressive pericolonic inflammatory stranding involving the distal rectosigmoid colon and rectum with asymmetric inflammatory changes within the left retroperitoneum along the left pelvic sidewall. There is, additionally, progressive soft tissue density  within the presacral space measuring 3.1 x 3.1 x 5.7 cm which is not well characterized on this noncontrast examination but may represent developing phlegmon or an infiltrative mass. Dedicated contrast enhanced CT or MRI examination is recommended for further evaluation. 2. Progressive bilateral perinephric and periureteric inflammatory stranding within the retroperitoneum which extends into the pelvis  where there is severe perivesicular inflammatory stranding and progressive bladder wall thickening. The findings can be seen in the setting  of aggressive infection and correlation with urinalysis and urine culture is recommended. In the appropriate clinical setting, radiation therapy could also result in a similar appearance. 3. Inflammatory stranding involving the root of the penis, again possibly related to changes local infection, as can be seen with  cellulitis, or radiation therapy. 4. Extensive multi-vessel coronary artery calcification.  Aortic Atherosclerosis  Antibiotic  Therapy: Anti-infectives (From admission, onward)     Start     Dose/Rate Route Frequency Ordered Stop   05/26/23 1800  cefTRIAXone (ROCEPHIN) 2 g in sodium chloride 0.9 % 100 mL IVPB        2 g 200 mL/hr over 30 Minutes Intravenous Every 24 hours 05/26/23 0350     05/26/23 0445  cefTRIAXone (ROCEPHIN) 2 g in sodium chloride 0.9 % 100 mL IVPB  Status:  Discontinued        2 g 200 mL/hr over 30 Minutes Intravenous Every 24 hours 05/26/23 0351 05/26/23 0406   05/25/23 1830  cefTRIAXone (ROCEPHIN) 2 g in sodium chloride 0.9 % 100 mL IVPB        2 g 200 mL/hr over 30 Minutes Intravenous Once 05/25/23 1822 05/25/23 1932       Procedures:   Consultants:     Assessment and Plan: * AKI (acute kidney injury) (HCC) On admission. - This is likely prerenal due to volume depletion and dehydration recurrent nausea and vomiting. - The patient be admitted to a medical telemetry observation bed. - We will continue hydration with IV normal saline. - Will follow BMP. - We will avoid nephrotoxins.  05-26-2023 Scr improved to 1.34. no further N/V. Will stop IVF. Allow for solid food.  Meningioma (HCC) On admission. - This is enlarging and could be contributing to his nausea and vomiting. - Neurology consultation can be obtained and called in a.m. - Will defer steroid therapy for his meningioma for the neurologist.  05-26-2023 no headache or N/V at this time.  MRI brain shows "Abnormal leptomeningeal enhancement which could be infectious or lymphomatous meningitis." Will ask neurology for input  Pelvic mass On admission. Abdominal and pelvic CT scan without contrast revealed the following:   1. Progressive pericolonic inflammatory stranding involving the distal rectosigmoid colon and rectum with asymmetric inflammatory  changes within the left retroperitoneum along the left pelvic sidewall. There is, additionally, progressive soft tissue density within the presacral space measuring 3.1 x 3.1 x 5.7 cm which is not well characterized on this noncontrast  examination but may represent developing phlegmon or an infiltrative mass. Dedicated contrast enhanced CT or MRI examination is recommended for further  evaluation. 2. Progressive bilateral perinephric and periureteric inflammatory stranding within the retroperitoneum which extends into the pelvis where there is severe perivesicular inflammatory stranding and  progressive bladder wall thickening. The findings can be seen in the setting of aggressive infection and correlation with urinalysis and urine culture is recommended. In the appropriate clinical setting,  radiation therapy could also result in a similar appearance. 3. Inflammatory stranding involving the root of the penis, again possibly related to changes local infection, as can be seen with cellulitis, or radiation therapy.  - The patient will benefit from urology consult given his scrotal swelling though he has been following on an outpatient basis on with his urologist. - He will need IV antibiotic therapy with Rocephin for potential pyelonephritis. - He will need a general surgery and possibly IR consult at some point for further assessment of his suspected pelvic mass.  05-26-2023 pt with presacral mass with rectal inflammation.   Have asked IR to look at CT and see if there is a window for biopsy.  With MRI brain findings, oncology would like LP for cytology and infectious workup.  Oncology agrees pt would benefit from transfer to [facility]. Pt's UA is negative for infection. He has been treated with po abx for about 2 weeks  now. I don't think his inflammation in his ureters/bladder is from infection but probably neoplastic.  Have communicated with Dr. Leonides Schanz with oncology. He thinks pt would be best served with transfer to [facility] so that pt can get biopsy of pre-sacral mass and LP for CNS lymphoma workup.  Pt may need to also  have urology consult if biopsy shows some sort of urological cancer. Pt's wife (vivian) and pt's mother aware of pt's  need to transfer to [facility].  Type 2 diabetes mellitus with vascular disease (HCC) 05-26-2023 continue with SSI.  Chronic diastolic CHF (congestive heart failure) (HCC) 05-26-2023 stable. Stop IVF. Last LVEF 04-2022 shows LVEF 55-60%.  DLBCL (diffuse large B cell lymphoma) (HCC) 05-26-2023 has been in remission since last year. Discussed case with Dr. Leonides Schanz with oncology. He wants pt to undergo lumbar puncture for cytology and workup for meningitis. Pt does not have any meningeal signs or nuchal rigidity.  Essential hypertension 05-26-2023 continue imdur and coreg  History of CVA with residual deficit 05-28-2023 chronic. Stable.   DVT prophylaxis:   SCDs   Code Status: Full Code Family Communication: discussed with pt, wife vivian and pt's mother Disposition Plan: unknown Reason for continuing need for hospitalization: needs transfer to [facility] for biopsy of pre-sacral mass, LP to r/o CNS lymphoma.  Objective: Vitals:   05/25/23 2315 05/26/23 0002 05/26/23 0549 05/26/23 1322  BP:  119/66 (!) 117/58 (!) 97/57  Pulse:  78 73 63  Resp:  20 18   Temp: 97.7 F (36.5 C) 97.7 F (36.5 C) 97.8 F (36.6 C) 98 F (36.7 C)  TempSrc: Oral Oral Oral Oral  SpO2:  100% 99% 100%  Weight:  103.1 kg    Height:        Intake/Output Summary (Last 24 hours) at 05/26/2023 1359 Last data filed at 05/26/2023 1300 Gross per 24 hour  Intake 2340 ml  Output 900 ml  Net 1440 ml   Filed Weights   05/25/23 1746 05/26/23 0002  Weight: 112 kg 103.1 kg    Examination:  Physical Exam Vitals and nursing note reviewed.  Constitutional:      General: He is not in acute distress.    Appearance: He is obese. He is not toxic-appearing or diaphoretic.  HENT:     Head: Normocephalic and atraumatic.     Nose: Nose normal.  Cardiovascular:     Rate and Rhythm: Normal rate and regular rhythm.  Pulmonary:     Effort: Pulmonary effort is normal.     Breath sounds: Normal breath sounds.  Abdominal:      General: Bowel sounds are normal. There is no distension.     Palpations: Abdomen is soft.     Tenderness: There is no abdominal tenderness. There is no guarding or rebound.  Genitourinary:    Comments: Scrotum size of large grapefruit. No pain on palpation of scrotum. No weeping or leakage. Musculoskeletal:     Cervical back: Neck supple. No rigidity.     Right lower leg: No edema.     Left lower leg: No edema.  Skin:    General: Skin is warm and dry.     Capillary Refill: Capillary refill takes less than 2 seconds.  Neurological:     Mental Status: He is alert and oriented to person, place, and time.     Comments: Mild dysarthria but easily understandable     Data Reviewed: I have personally reviewed following labs and imaging studies  CBC: Recent Labs  Lab 05/25/23 1945 05/26/23 0414  WBC 6.1 5.5  NEUTROABS 3.6  --   HGB 13.6 12.3*  HCT 43.2 38.9*  MCV 81.7 82.2  PLT 244 189   Basic Metabolic Panel: Recent Labs  Lab 05/25/23 1945 05/26/23 0414  NA 137 141  K 5.4* 4.5  CL 104 110  CO2 20* 20*  GLUCOSE 212* 139*  BUN 34* 35*  CREATININE 1.79* 1.34*  CALCIUM 9.7 9.0   GFR: Estimated Creatinine Clearance: 63.8 mL/min (A) (by C-G formula based on SCr of 1.34 mg/dL (H)). Liver Function Tests: Recent Labs  Lab 05/25/23 1945  AST 20  ALT 28  ALKPHOS 61  BILITOT 1.4*  PROT 8.1  ALBUMIN 3.9    Recent Labs  Lab 05/25/23 2150  AMMONIA <10   Coagulation Profile: Recent Labs  Lab 05/25/23 1945  INR 1.1   BNP (last 3 results) Recent Labs    05/06/23 2141  BNP 86.0   HbA1C: Recent Labs    05/25/23 1945  HGBA1C 8.3*   CBG: Recent Labs  Lab 05/25/23 1819 05/26/23 0746 05/26/23 1206  GLUCAP 177* 138* 192*   Sepsis Labs: Recent Labs  Lab 05/25/23 1957 05/25/23 2150  LATICACIDVEN 2.2* 2.2*    Recent Results (from the past 240 hours)  Resp panel by RT-PCR (RSV, Flu A&B, Covid) Anterior Nasal Swab     Status: None   Collection Time:  05/25/23  6:30 PM   Specimen: Anterior Nasal Swab  Result Value Ref Range Status   SARS Coronavirus 2 by RT PCR NEGATIVE NEGATIVE Final    Comment: (NOTE) SARS-CoV-2 target nucleic acids are NOT DETECTED.  The SARS-CoV-2 RNA is generally detectable in upper respiratory specimens during the acute phase of infection. The lowest concentration of SARS-CoV-2 viral copies this assay can detect is 138 copies/mL. A negative result does not preclude SARS-Cov-2 infection and should not be used as the sole basis for treatment or other patient management decisions. A negative result may occur with  improper specimen collection/handling, submission of specimen other than nasopharyngeal swab, presence of viral mutation(s) within the areas targeted by this assay, and inadequate number of viral copies(<138 copies/mL). A negative result must be combined with clinical observations, patient history, and epidemiological information. The expected result is Negative.  Fact Sheet for Patients:  BloggerCourse.com  Fact Sheet for Healthcare Providers:  SeriousBroker.it  This test is no t yet approved or cleared by the [place] FDA and  has been authorized for detection and/or diagnosis of SARS-CoV-2 by FDA under an Emergency Use Authorization (EUA). This EUA will remain  in effect (meaning this test can be used) for the duration of the COVID-19 declaration under Section 564(b)(1) of the Act, 21 U.S.C.section 360bbb-3(b)(1), unless the authorization is terminated  or revoked sooner.       Influenza A by PCR NEGATIVE NEGATIVE Final   Influenza B by PCR NEGATIVE NEGATIVE Final    Comment: (NOTE) The Xpert Xpress SARS-CoV-2/FLU/RSV plus assay is intended as an aid in the diagnosis of influenza from Nasopharyngeal swab specimens and should not be used as a sole basis for treatment. Nasal washings and aspirates are unacceptable for Xpert Xpress  SARS-CoV-2/FLU/RSV testing.  Fact Sheet for Patients: BloggerCourse.com  Fact Sheet for Healthcare Providers: SeriousBroker.it  This test is not yet approved or cleared by the [place] FDA and has been authorized for detection and/or diagnosis of SARS-CoV-2 by FDA under an Emergency Use Authorization (EUA). This EUA will remain in effect (meaning  this test can be used) for the duration of the COVID-19 declaration under Section 564(b)(1) of the Act, 21 U.S.C. section 360bbb-3(b)(1), unless the authorization is terminated or revoked.     Resp Syncytial Virus by PCR NEGATIVE NEGATIVE Final    Comment: (NOTE) Fact Sheet for Patients: BloggerCourse.com  Fact Sheet for Healthcare Providers: SeriousBroker.it  This test is not yet approved or cleared by the [place] FDA and has been authorized for detection and/or diagnosis of SARS-CoV-2 by FDA under an Emergency Use Authorization (EUA). This EUA will remain in effect (meaning this test can be used) for the duration of the COVID-19 declaration under Section 564(b)(1) of the Act, 21 U.S.C. section 360bbb-3(b)(1), unless the authorization is terminated or revoked.  Performed at [facility], [address] [place], [place] [address]   Blood Culture (routine x 2)     Status: None (Preliminary result)   Collection Time: 05/25/23  7:56 PM   Specimen: BLOOD  Result Value Ref Range Status   Specimen Description BLOOD BLOOD RIGHT HAND  Final   Special Requests   Final    BOTTLES DRAWN AEROBIC AND ANAEROBIC Blood Culture results may not be optimal due to an inadequate volume of blood received in culture bottles   Culture   Final    NO GROWTH < 12 HOURS Performed at [facility], [address], [place], [place] [address]    Report Status PENDING  Incomplete  Blood Culture (routine x 2)     Status: None (Preliminary result)    Collection Time: 05/25/23  7:59 PM   Specimen: Left Antecubital; Blood  Result Value Ref Range Status   Specimen Description   Final    LEFT ANTECUBITAL BOTTLES DRAWN AEROBIC AND ANAEROBIC   Special Requests   Final    Blood Culture results may not be optimal due to an inadequate volume of blood received in culture bottles   Culture   Final    NO GROWTH < 12 HOURS Performed at [facility], [address] [place], [place] [address]    Report Status PENDING  Incomplete     Radiology Studies: MR Brain W and Wo Contrast Result Date: 05/26/2023 CLINICAL DATA:  Mental status change with unknown cause. EXAM: MRI HEAD WITHOUT AND WITH CONTRAST TECHNIQUE: Multiplanar, multiecho pulse sequences of the brain and surrounding structures were obtained without and with intravenous contrast. CONTRAST:  10mL GADAVIST GADOBUTROL 1 MMOL/ML IV SOLN COMPARISON:  Head CT from yesterday FINDINGS: Brain: Wedge of weakly restricted diffusion in the inferior left cerebellum with homogeneous enhancement, area measuring up to 13 mm. There have been numerous prior small vessel infarcts in the bilateral cerebellum. There is also subtle leptomeningeal enhancement especially in the inferior left cerebellum, along superior cerebellar folia, and likely along the inferior temporal lobes. Indistinct enhancement at least in the left internal auditory canal. Chronic small vessel ischemic gliosis in the cerebral white matter with chronic perforator infarct at the left external capsule and corona radiata. Ventriculomegaly primarily attributed to brain atrophy. Periventricular FLAIR hyperintensity likely from chronic small vessel ischemia. No morphologic changes typical of communicating hydrocephalus. Dural based mass along the left cerebral convexity with dural tail, mass measuring 6.5 cm in length by 1.8 cm in thickness. The mass has gradually enlarged since a 2019 brain MRI, consistent with meningioma rather than CNS lymphoma. There is  local mass effect without brain edema. Vascular: Major flow voids are preserved Skull and upper cervical spine: No focal marrow lesion. Sinuses/Orbits: Patchy opacification  of the paranasal sinuses with multiple areas of polypoid formation, including a nonenhancing polyp projecting into the nasopharynx. IMPRESSION: 1. Abnormal leptomeningeal enhancement which could be infectious or lymphomatous meningitis. 2. 13 mm area of enhancement in the inferior left cerebellum has signal characteristics seen in both subacute infarct and CNS lymphoma. Consider CSF analysis. 3. Large meningioma along the left frontal convexity, slowly growing since at least 2019. Electronically Signed   By: Tiburcio Pea M.D.   On: 05/26/2023 11:09   CT ABDOMEN PELVIS WO CONTRAST Result Date: 05/25/2023 CLINICAL DATA:  Left lower quadrant abdominal pain EXAM: CT ABDOMEN AND PELVIS WITHOUT CONTRAST TECHNIQUE: Multidetector CT imaging of the abdomen and pelvis was performed following the standard protocol without IV contrast. RADIATION DOSE REDUCTION: This exam was performed according to the departmental dose-optimization program which includes automated exposure control, adjustment of the mA and/or kV according to patient size and/or use of iterative reconstruction technique. COMPARISON:  04/28/2023 FINDINGS: Lower chest: Appear bilateral lower lobe pulmonary nodules are stable since remote prior examination 02/13/2022 and are safely considered benign. No acute abnormality. Extensive multi-vessel coronary artery calcification. Hepatobiliary: No focal liver abnormality is seen. No gallstones, gallbladder wall thickening, or biliary dilatation. Pancreas: Unremarkable Spleen: Unremarkable Adrenals/Urinary Tract: Adrenal glands are unremarkable. There is extensive bilateral perinephric and periureteric inflammatory stranding within the retroperitoneum which appears progressive since prior examination and extends into the pelvis where there is  severe perivesicular inflammatory stranding and progressive bladder wall thickening. The findings can be seen in the setting aggressive infection. In the appropriate clinical setting, radiation therapy could also result in a similar appearance. No hydronephrosis. No intrarenal or ureteral calculi. The bladder is not distended. Stomach/Bowel: Moderate colonic stool burden. There is progressive pericolonic inflammatory stranding involving the distal rectosigmoid colon and rectum with asymmetric inflammatory changes within the left retroperitoneum along the left pelvic sidewall. There is, additionally, progressive soft tissue density within the presacral space measuring 3.1 x 3.1 x 5.7 cm at axial image # 74/2 and sagittal image # 71/6 which is not well characterized on this noncontrast examination but may represent developing phlegmon or an infiltrative mass. There is extensive circumferential rectal wall thickening involving the terminal rectum. The stomach, small bowel, and large bowel are otherwise unremarkable. Appendix. Vascular/Lymphatic: Aortic atherosclerosis. No enlarged abdominal or pelvic lymph nodes. Reproductive: There is inflammatory stranding involving the root of the penis, again possibly related to changes local infection, as can be seen with cellulitis, or radiation therapy. The prostate gland is of normal size but difficult to discretely discern adjacent inflammatory change. Other: Stable dermal thickening within the anterior abdominal wall nonspecific. Postsurgical changes noted within the right inguinal region. Musculoskeletal: No acute bone abnormality. No lytic or blastic bone lesion. IMPRESSION: 1. Progressive pericolonic inflammatory stranding involving the distal rectosigmoid colon and rectum with asymmetric inflammatory changes within the left retroperitoneum along the left pelvic sidewall. There is, additionally, progressive soft tissue density within the presacral space measuring 3.1 x 3.1  x 5.7 cm which is not well characterized on this noncontrast examination but may represent developing phlegmon or an infiltrative mass. Dedicated contrast enhanced CT or MRI examination is recommended for further evaluation. 2. Progressive bilateral perinephric and periureteric inflammatory stranding within the retroperitoneum which extends into the pelvis where there is severe perivesicular inflammatory stranding and progressive bladder wall thickening. The findings can be seen in the setting of aggressive infection and correlation with urinalysis and urine culture is recommended. In the appropriate clinical setting, radiation therapy could  also result in a similar appearance. 3. Inflammatory stranding involving the root of the penis, again possibly related to changes local infection, as can be seen with cellulitis, or radiation therapy. 4. Extensive multi-vessel coronary artery calcification. Aortic Atherosclerosis (ICD10-I70.0). Electronically Signed   By: Helyn Numbers M.D.   On: 05/25/2023 23:35   CT Head Wo Contrast Result Date: 05/25/2023 CLINICAL DATA:  Mental status change EXAM: CT HEAD WITHOUT CONTRAST TECHNIQUE: Contiguous axial images were obtained from the base of the skull through the vertex without intravenous contrast. RADIATION DOSE REDUCTION: This exam was performed according to the departmental dose-optimization program which includes automated exposure control, adjustment of the mA and/or kV according to patient size and/or use of iterative reconstruction technique. COMPARISON:  CT 03/06/2018, MRI 08/02/2017 FINDINGS: Brain: No acute territorial infarction or hemorrhage is visualized. Interval mild ventricular enlargement compared to prior head CT but no significant progression of atrophy. Underlying chronic small vessel ischemic changes of the white matter but increased white matter hypodensity adjacent to the frontal and occipital horns. Small chronic appearing left capsular infarct  extending into the white matter. Small chronic cerebellar infarcts. Possible small chronic right occipital infarct. Interval enlargement of left convexity slightly dense plaque like mass, measures about 6.3 cm AP x 17 cm transverse on series 2, image 13, previously 41 x 14 mm. No surrounding hypodense edema. No midline shift. Vascular: No hyperdense vessels.  Carotid vascular calcification Skull: Normal. Negative for fracture or focal lesion. Sinuses/Orbits: Mucosal thickening in the sinuses with small fluid level right sphenoid sinus Other: None IMPRESSION: 1. Negative for acute intracranial hemorrhage or midline shift. 2. Interval enlargement of left convexity slightly dense extra-axial mass, measures about 6.3 cm AP x 17 cm transverse, previously 41 x 14 mm. No surrounding hypodense edema. No midline shift. Findings previously characterized as probable meningioma 3. Interval mild ventricular enlargement compared to prior head CT but no significant progression of atrophy. Increased white matter hypodensity adjacent to the frontal and occipital horns, difficult to exclude normal pressure hydrocephalus with transependymal flow of CSF. Suggest further assessment with MRI. Left convexity extra-axial enlarged mass could also be evaluated at MRI follow-up. 4. Small chronic infarcts as described above. 5. Mucosal thickening in the sinuses with small fluid level right sphenoid sinus, correlate for acute sinusitis. Electronically Signed   By: Jasmine Pang M.D.   On: 05/25/2023 21:46   DG Chest Port 1 View Result Date: 05/25/2023 CLINICAL DATA:  Weakness. EXAM: PORTABLE CHEST 1 VIEW COMPARISON:  November 16, 2022. FINDINGS: The heart size and mediastinal contours are within normal limits. Both lungs are clear. The visualized skeletal structures are unremarkable. IMPRESSION: No active disease. Electronically Signed   By: Lupita Raider M.D.   On: 05/25/2023 20:56    Scheduled Meds:  allopurinol  300 mg Oral Daily    carvedilol  12.5 mg Oral BID WC   ezetimibe  10 mg Oral Daily   insulin aspart  0-20 Units Subcutaneous Q4H   isosorbide mononitrate  30 mg Oral Daily   levothyroxine  75 mcg Oral Q0600   rosuvastatin  40 mg Oral Daily   tamsulosin  0.4 mg Oral Daily   Continuous Infusions:   LOS: 0 days   Time spent: 60 minutes  Carollee Herter, DO  [facility]  05/26/2023, 1:59 PM

## 2023-05-26 NOTE — Plan of Care (Signed)
  Problem: Acute Rehab PT Goals(only PT should resolve) Goal: Pt Will Go Supine/Side To Sit Outcome: Progressing Flowsheets (Taken 05/26/2023 1016) Pt will go Supine/Side to Sit:  with modified independence  with supervision Goal: Patient Will Transfer Sit To/From Stand Outcome: Progressing Flowsheets (Taken 05/26/2023 1016) Patient will transfer sit to/from stand:  with modified independence  with supervision Goal: Pt Will Transfer Bed To Chair/Chair To Bed Outcome: Progressing Flowsheets (Taken 05/26/2023 1016) Pt will Transfer Bed to Chair/Chair to Bed:  with modified independence  with supervision Goal: Pt Will Ambulate Outcome: Progressing Flowsheets (Taken 05/26/2023 1016) Pt will Ambulate:  100 feet  with modified independence  Luz Lex, PT, DPT [facility] Office: [phone]

## 2023-05-26 NOTE — Progress Notes (Signed)
 Mobility Specialist Progress Note:    05/26/23 1214  Mobility  Activity Stood at bedside;Transferred from bed to chair  Level of Assistance Moderate assist, patient does 50-74%  Assistive Device Front wheel walker  Distance Ambulated (ft) 5 ft  Range of Motion/Exercises Active;All extremities  Activity Response Tolerated well  Mobility Referral Yes  Mobility visit 1 Mobility  Mobility Specialist Start Time (ACUTE ONLY) 1200  Mobility Specialist Stop Time (ACUTE ONLY) 1215  Mobility Specialist Time Calculation (min) (ACUTE ONLY) 15 min   Pt received in bed requesting assistance to stand. Required ModA to stand and transfer with RW. Tolerated well, asx throughout. Left pt in chair, alarm on and call bell in hand. NT at bedside, all needs met.  Lawerance Bach Mobility Specialist Please contact via Special educational needs teacher or  Rehab office at [phone]

## 2023-05-26 NOTE — Assessment & Plan Note (Signed)
 05-26-2023 has been in remission since last year. Discussed case with Dr. Leonides Schanz with oncology. He wants pt to undergo lumbar puncture for cytology and workup for meningitis. Pt does not have any meningeal signs or nuchal rigidity.

## 2023-05-26 NOTE — Subjective & Objective (Signed)
 Pt seen and examined. Met with wife at bedside. Pt feeling some better. No fevers. No headache. No neck pain.

## 2023-05-26 NOTE — Plan of Care (Signed)
  Problem: Acute Rehab OT Goals (only OT should resolve) Goal: Pt. Will Perform Grooming Flowsheets (Taken 05/26/2023 1131) Pt Will Perform Grooming: with modified independence Goal: Pt. Will Perform Upper Body Dressing Flowsheets (Taken 05/26/2023 1131) Pt Will Perform Upper Body Dressing: with modified independence Goal: Pt. Will Perform Lower Body Dressing Flowsheets (Taken 05/26/2023 1131) Pt Will Perform Lower Body Dressing:  with modified independence  sitting/lateral leans Goal: Pt. Will Transfer To Toilet Flowsheets (Taken 05/26/2023 1131) Pt Will Transfer to Toilet:  with modified independence  ambulating Goal: Pt. Will Perform Toileting-Clothing Manipulation Flowsheets (Taken 05/26/2023 1131) Pt Will Perform Toileting - Clothing Manipulation and hygiene:  with modified independence  sitting/lateral leans  Donaldo Teegarden OT, MOT

## 2023-05-26 NOTE — TOC Initial Note (Signed)
 Transition of Care [facility]) - Initial/Assessment Note    Patient Details  Name: Alex Cortez MRN: [id number] Date of Birth: 1950/05/20  Transition of [facility]) CM/SW Contact:    Isabella Bowens, LCSWA Phone Number: 05/26/2023, 11:24 AM  Clinical Narrative:                  CSW spoke with spouse and assessed patient. CSW shared with spouse about PT recommendation for SNF. Spouse and patient agreeable to CSW sending referral to [facility] for Short Rehab. Referral sent. CSW Will follow.   Patient has a walker and cane fro equipment, was still able to drive, and has some support at home.   Expected Discharge Plan: Skilled Nursing Facility Barriers to Discharge: Continued Medical Work up   Patient Goals and CMS Choice Patient states their goals for this hospitalization and ongoing recovery are:: Get better and DC to SNF for rehab CMS Medicare.gov Compare Post Acute Care list provided to:: Patient Represenative (must comment) (Spouse : Alex Cortez) Choice offered to / list presented to : Spouse      Expected Discharge Plan and Services In-house Referral: Clinical Social Work   Post Acute Care Choice: Durable Medical Equipment Living arrangements for the past 2 months: Single Family Home                                      Prior Living Arrangements/Services Living arrangements for the past 2 months: Single Family Home Lives with:: Spouse Patient language and need for interpreter reviewed:: Yes Do you feel safe going back to the place where you live?: No   Spouse want pt to go to SNF first for rehab  Need for Family Participation in Patient Care: Yes (Comment) Care giver support system in place?: Yes (comment) Current home services: DME Criminal Activity/Legal Involvement Pertinent to Current Situation/Hospitalization: No - Comment as needed  Activities of Daily Living   ADL Screening (condition at time of admission) Independently performs ADLs?: No Does the  patient have a NEW difficulty with bathing/dressing/toileting/self-feeding that is expected to last >3 days?: Yes (Initiates electronic notice to provider for possible OT consult) Does the patient have a NEW difficulty with getting in/out of bed, walking, or climbing stairs that is expected to last >3 days?: Yes (Initiates electronic notice to provider for possible PT consult) Does the patient have a NEW difficulty with communication that is expected to last >3 days?: Yes (Initiates electronic notice to provider for possible SLP consult) Is the patient deaf or have difficulty hearing?: No Does the patient have difficulty seeing, even when wearing glasses/contacts?: No Does the patient have difficulty concentrating, remembering, or making decisions?: Yes  Permission Sought/Granted      Share Information with NAME: Alex Cortez     Permission granted to share info w Relationship: Patient     Emotional Assessment Appearance:: Appears stated age Attitude/Demeanor/Rapport: Engaged Affect (typically observed): Appropriate Orientation: : Oriented to Self Alcohol / Substance Use: Not Applicable Psych Involvement: No (comment)  Admission diagnosis:  Dehydration [E86.0] AKI (acute kidney injury) (HCC) [N17.9] Acute kidney injury (HCC) [N17.9] Altered mental status, unspecified altered mental status type [R41.82] Patient Active Problem List   Diagnosis Date Noted   Meningioma (HCC) 05/26/2023   Pelvic mass 05/26/2023   AKI (acute kidney injury) (HCC) 05/25/2023   Chronic diastolic CHF (congestive heart failure) (HCC) 11/17/2022   Coronary artery disease 11/17/2022  Insulin long-term use (HCC) 08/10/2022   Abscess of right groin 07/12/2022   Port-A-Cath in place 05/05/2022   DLBCL (diffuse large B cell lymphoma) (HCC) 04/23/2022   Class 2 severe obesity due to excess calories with serious comorbidity and body mass index (BMI) of 35.0 to 35.9 in adult [facility]) 12/28/2021   Hypothyroidism  01/12/2018   Cerebral embolism with cerebral infarction 08/01/2017   CVA (cerebral vascular accident) (HCC) 08/01/2017   History of CVA with residual deficit    Essential hypertension    Type 2 diabetes mellitus with obesity (HCC)    Class 1 obesity due to excess calories with serious comorbidity and body mass index (BMI) of 32.0 to 32.9 in adult 09/09/2015   Sedentary lifestyle 09/09/2015   Mixed hyperlipidemia 02/24/2015   Left pontine stroke (HCC) 10/23/2012   Exertional dyspnea 10/18/2012   Diastolic heart failure (HCC) 10/01/2011   Obstructive sleep apnea 09/02/2011   LV dysfunction 09/02/2011   ERECTILE DYSFUNCTION, SECONDARY TO MEDICATION 04/14/2010   Type 2 diabetes mellitus with vascular disease (HCC) 10/02/2009   CORONARY ATHEROSCLEROSIS NATIVE CORONARY ARTERY 10/02/2009   PCP:  Juliette Alcide, MD Pharmacy:   [phone] [place], [place] - [address] AT [facility] [address] [place] [address] Phone: [phone] Fax: [phone]  [facility] [place], [place] - [address] [address] [facility] AT Portal to Registered [facility] [address] [address] Phone: [phone] Fax: [phone]  [phone] [place], [place] - [address] [place] [address] Phone: [phone] Fax: [phone]     Social Drivers of Health (SDOH) Social History: SDOH Screenings   Food Insecurity: Patient Unable To Answer (05/26/2023)  Housing: Patient Unable To Answer (05/26/2023)  Transportation Needs: Patient Unable To Answer (05/26/2023)  Utilities: Patient Unable To Answer (05/26/2023)  Depression (PHQ2-9): Low Risk  (04/07/2022)  Financial Resource Strain: Low Risk  (01/03/2023)  Physical Activity: Inactive (11/22/2022)  Social Connections: Unknown (05/26/2023)  Stress: No Stress Concern Present (04/07/2022)  Tobacco Use: Medium Risk (05/25/2023)  Health Literacy: Adequate  Health Literacy (12/06/2022)   SDOH Interventions:     Readmission Risk Interventions    11/17/2022   10:57 AM 07/14/2022   12:04 PM  Readmission Risk Prevention Plan  Transportation Screening Complete Complete  HRI or Home Care Consult Complete Complete  Social Work Consult for Recovery Care Planning/Counseling Complete Complete  Palliative Care Screening Not Applicable Not Applicable  Medication Review Oceanographer) Complete Complete

## 2023-05-26 NOTE — H&P (Signed)
 [facility]   PATIENT NAME: Alex Cortez    MR#:  [id number]  DATE OF BIRTH:  05/31/1950  DATE OF ADMISSION:  05/25/2023  PRIMARY CARE PHYSICIAN: Juliette Alcide, MD   Patient is coming from: Home  REQUESTING/REFERRING PHYSICIAN: Eber Hong, MD  CHIEF COMPLAINT:   Chief Complaint  Patient presents with   Weakness    HISTORY OF PRESENT ILLNESS:  Alex Cortez is a 73 y.o. male with medical history significant for coronary artery disease, type  2 diabetes mellitus, dyslipidemia, essential hypertension, CVA and coronary artery disease, who presented to the emergency room with a [place] of lower abdominal pain with associated nausea and vomiting without diarrhea.  No bilious vomitus or hematemesis.  No dysuria, oliguria or hematuria or flank pain.  No fever or chills.  No cough or wheezing or dyspnea.  No chest pain or palpitations.  He has been having altered mental status with mild confusion.  He denied any paresthesias or focal muscle weakness.  No witnessed seizures.  No urinary or stool incontinence.  He was seen on outpatient basis a couple weeks ago and started on p.o. Bactrim for suspected scrotal cellulitis.  He has been having scrotal and penile edema over the last several weeks.  When he did not tolerate Bactrim he was switched to p.o doxycycline and he recently finished it.  He has not gotten out of bed over the last 24 hours and has been having significant oliguria and feeling very weak.  He was initially unable to answer questions appropriately and was more appropriate during my interview.  No fever or chills..  ED Course: When the patient  came to the ER, BP was 75/57 and later 87/55 with otherwise normal vital signs then 103/57.  Labs revealed Potassium 5.4 CO2 of 20 with blood glucose of 212 and BUN of 34 with creatinine 1.79 and total bili of 1.4 with GFR of 40.  Lactic acid was 2.2 and later the same.  CBC was within normal.  Coag profile was within normal.   UA showed more than 500 glucose and 30 protein.  Blood cultures were drawn. EKG as reviewed by me : EKG showed normal sinus rhythm rate of 76. Imaging: Noncontrast head CT scan revealed the following: 1. Negative for acute intracranial hemorrhage or midline shift. 2. Interval enlargement of left convexity slightly dense extra-axial mass, measures about 6.3 cm AP x 17 cm transverse, previously 41 x 14 mm. No surrounding hypodense edema. No midline shift. Findings previously characterized as probable meningioma 3. Interval mild ventricular enlargement compared to prior head CT but no significant progression of atrophy. Increased white matter hypodensity adjacent to the frontal and occipital horns, difficult to exclude normal pressure hydrocephalus with transependymal flow of CSF. Suggest further assessment with MRI. Left convexity extra-axial enlarged mass could also be evaluated at MRI follow-up. 4. Small chronic infarcts as described above. 5. Mucosal thickening in the sinuses with small fluid level right sphenoid sinus, correlate for acute sinusitis.   Abdominal and pelvic CT scan without contrast revealed the following:  1. Progressive pericolonic inflammatory stranding involving the distal rectosigmoid colon and rectum with asymmetric inflammatory changes within the left retroperitoneum along the left pelvic sidewall. There is, additionally, progressive soft tissue density within the presacral space measuring 3.1 x 3.1 x 5.7 cm which is not well characterized on this noncontrast examination but may represent developing phlegmon or an infiltrative mass. Dedicated contrast enhanced CT or MRI examination is recommended  for further evaluation. 2. Progressive bilateral perinephric and periureteric inflammatory stranding within the retroperitoneum which extends into the pelvis where there is severe perivesicular inflammatory stranding and progressive bladder wall thickening. The findings  can be seen in the setting of aggressive infection and correlation with urinalysis and urine culture is recommended. In the appropriate clinical setting, radiation therapy could also result in a similar appearance. 3. Inflammatory stranding involving the root of the penis, again possibly related to changes local infection, as can be seen with cellulitis, or radiation therapy. 4. Extensive multi-vessel coronary artery calcification. 5. Aortic Atherosclerosis (ICD10-I70.0).  The patient was given 2 g of IV Rocephin, IV lactated Ringer's for 150 mL/h after 2 L bolus of IV normal saline.  The patient was admitted to a telemetry observation bed for further evaluation and management. PAST MEDICAL HISTORY:   Past Medical History:  Diagnosis Date   CAD (coronary artery disease)    nonobstructive CAD/patent LAD stent site; EF 65%, 08/2011; NSTEMI/DES LAD, 6/20 11   Chest pain    Diabetes mellitus    insulin-dependent   Dyslipidemia    HTN (hypertension)    Hypothyroidism    Myocardial infarction (HCC) 2011   Post MI syndrome (HCC) 08/29/2009   Stroke (HCC) 10/22/2012   Left inferior pons    PAST SURGICAL HISTORY:   Past Surgical History:  Procedure Laterality Date   COLONOSCOPY  08/2021   CORONARY ANGIOPLASTY WITH STENT PLACEMENT  2011   IR IMAGING GUIDED PORT INSERTION  04/26/2022   IR REMOVAL TUN ACCESS W/ PORT W/O FL MOD SED  01/20/2023   LYMPH NODE BIOPSY Right 04/14/2022   Procedure: RIGHT INGUINAL EXCISIONAL LYMPH NODE BIOPSY;  Surgeon: Fritzi Mandes, MD;  Location: [facility];  Service: General;  Laterality: Right;    SOCIAL HISTORY:   Social History   Tobacco Use   Smoking status: Former    Current packs/day: 0.00    Average packs/day: 0.5 packs/day for 33.0 years (16.5 ttl pk-yrs)    Types: Cigarettes    Start date: 03/16/1955    Quit date: 03/15/1988    Years since quitting: 35.2   Smokeless tobacco: Never  Substance Use Topics   Alcohol use: No    Alcohol/week: 0.0  standard drinks of alcohol    FAMILY HISTORY:   Family History  Problem Relation Age of Onset   Colon cancer Mother    Hypertension Other        in all family members    DRUG ALLERGIES:   Allergies  Allergen Reactions   Ace Inhibitors Swelling    Angioedema   Other Swelling    Shellfish   Swelling and hives  Shellfish  Swelling and hives   Rituxan [Rituximab] Other (See Comments)    Pt had sensitivity reaction to Rituxan. See progress note from 05/14/22. Pt able to complete infusion.    Shrimp [Shellfish Allergy] Hives and Swelling    REVIEW OF SYSTEMS:   ROS As per history of present illness. All pertinent systems were reviewed above. Constitutional, HEENT, cardiovascular, respiratory, GI, GU, musculoskeletal, neuro, psychiatric, endocrine, integumentary and hematologic systems were reviewed and are otherwise negative/unremarkable except for positive findings mentioned above in the HPI.   MEDICATIONS AT HOME:   Prior to Admission medications   Medication Sig Start Date End Date Taking? Authorizing Provider  allopurinol (ZYLOPRIM) 300 MG tablet TAKE 1 TABLET BY MOUTH EVERY DAY 08/30/22   Namon Cirri E, PA-C  carvedilol (COREG) 12.5 MG tablet Take 1  tablet (12.5 mg total) by mouth 2 (two) times daily with a meal. 11/18/22   Rolly Salter, MD  clopidogrel (PLAVIX) 75 MG tablet Take 1 tablet (75 mg total) by mouth daily. 11/28/17   Ihor Austin, NP  Continuous Glucose Receiver (FREESTYLE LIBRE 3 READER) DEVI 1 Piece by Does not apply route once as needed for up to 1 dose. 12/13/22   Roma Kayser, MD  Continuous Glucose Sensor (FREESTYLE LIBRE 3 PLUS SENSOR) MISC Change sensor every 15 days. E11.65 04/19/23   Roma Kayser, MD  dapagliflozin propanediol (FARXIGA) 10 MG TABS tablet Take 1 tablet (10 mg total) by mouth daily with breakfast. 01/25/23   Nida, Denman George, MD  dapagliflozin propanediol (FARXIGA) 10 MG TABS tablet Take 1 tablet (10 mg  total) by mouth daily before breakfast. 04/08/23   Nida, Denman George, MD  doxycycline (VIBRAMYCIN) 100 MG capsule Take 1 capsule (100 mg total) by mouth every 12 (twelve) hours for 10 days. 05/18/23 05/28/23  Donnita Falls, FNP  ezetimibe (ZETIA) 10 MG tablet Take 1 tablet (10 mg total) by mouth daily. 12/27/22   Antoine Poche, MD  insulin regular human CONCENTRATED (HUMULIN R U-500 KWIKPEN) 500 UNIT/ML KwikPen INJECT AS DIRECTED 60 UNITS WITH BREAKFAST, 60 UNITS WITH LUNCH, AND 60 UNITS WITH SUPPER FOR PRE-MEAL BLOOD GLUCOSE READINGS OF 90MG /DL OR ABOVE 03/20/08   Roma Kayser, MD  isosorbide mononitrate (IMDUR) 30 MG 24 hr tablet TAKE 1 TABLET BY MOUTH EVERY DAY 09/13/22   Antoine Poche, MD  levothyroxine (SYNTHROID) 75 MCG tablet TAKE 1 TABLET BY MOUTH EVERY DAY BEFORE BREAKFAST 04/08/23   Roma Kayser, MD  lidocaine-prilocaine (EMLA) cream Apply 1 Application topically as needed. Patient taking differently: Apply 1 Application topically daily as needed (port access). 04/29/22   Jaci Standard, MD  metFORMIN (GLUCOPHAGE) 500 MG tablet TAKE 1 TABLET BY MOUTH EVERY DAY WITH BREAKFAST 04/08/23   Roma Kayser, MD  nitroGLYCERIN (NITROSTAT) 0.4 MG SL tablet Place 1 tablet (0.4 mg total) under the tongue every 5 (five) minutes as needed. 08/30/11   de Melanie Crazier, MD  ONE TOUCH ULTRA TEST test strip USE TO TEST BLOOD SUGAR FOUR TIMES DAILY E11.65 12/16/15   Roma Kayser, MD  rosuvastatin (CRESTOR) 40 MG tablet TAKE 1 TABLET BY MOUTH EVERY DAY 04/08/23   Antoine Poche, MD  tamsulosin (FLOMAX) 0.4 MG CAPS capsule Take 0.4 mg by mouth daily. 02/01/23   [provider]      VITAL SIGNS:  Blood pressure 119/66, pulse 78, temperature 97.7 F (36.5 C), temperature source Oral, resp. rate 20, height 6\' 3"  (1.905 m), weight 103.1 kg, SpO2 100%.  PHYSICAL EXAMINATION:  Physical Exam  GENERAL:  73 y.o.-year-old patient lying in the bed with no acute  distress.  EYES: Pupils equal, round, reactive to light and accommodation. No scleral icterus. Extraocular muscles intact.  HEENT: Head atraumatic, normocephalic. Oropharynx and nasopharynx clear.  NECK:  Supple, no jugular venous distention. No thyroid enlargement, no tenderness.  LUNGS: Normal breath sounds bilaterally, no wheezing, rales,rhonchi or crepitation. No use of accessory muscles of respiration.  CARDIOVASCULAR: Regular rate and rhythm, S1, S2 normal. No murmurs, rubs, or gallops.  ABDOMEN: Soft, nondistended, nontender. Bowel sounds present. No organomegaly or mass.  EXTREMITIES: No pedal edema, cyanosis, or clubbing.  NEUROLOGIC: Cranial nerves II through XII are intact. Muscle strength 5/5 in all extremities. Sensation intact. Gait not checked.  PSYCHIATRIC: The patient  is alert and oriented x 3.  Normal affect and good eye contact. SKIN: No obvious rash, lesion, or ulcer.   LABORATORY PANEL:   CBC Recent Labs  Lab 05/25/23 1945  WBC 6.1  HGB 13.6  HCT 43.2  PLT 244   ------------------------------------------------------------------------------------------------------------------  Chemistries  Recent Labs  Lab 05/25/23 1945  NA 137  K 5.4*  CL 104  CO2 20*  GLUCOSE 212*  BUN 34*  CREATININE 1.79*  CALCIUM 9.7  AST 20  ALT 28  ALKPHOS 61  BILITOT 1.4*   ------------------------------------------------------------------------------------------------------------------  Cardiac Enzymes No results for input(s): "TROPONINI" in the last 168 hours. ------------------------------------------------------------------------------------------------------------------  RADIOLOGY:  CT ABDOMEN PELVIS WO CONTRAST Result Date: 05/25/2023 CLINICAL DATA:  Left lower quadrant abdominal pain EXAM: CT ABDOMEN AND PELVIS WITHOUT CONTRAST TECHNIQUE: Multidetector CT imaging of the abdomen and pelvis was performed following the standard protocol without IV contrast. RADIATION  DOSE REDUCTION: This exam was performed according to the departmental dose-optimization program which includes automated exposure control, adjustment of the mA and/or kV according to patient size and/or use of iterative reconstruction technique. COMPARISON:  04/28/2023 FINDINGS: Lower chest: Appear bilateral lower lobe pulmonary nodules are stable since remote prior examination 02/13/2022 and are safely considered benign. No acute abnormality. Extensive multi-vessel coronary artery calcification. Hepatobiliary: No focal liver abnormality is seen. No gallstones, gallbladder wall thickening, or biliary dilatation. Pancreas: Unremarkable Spleen: Unremarkable Adrenals/Urinary Tract: Adrenal glands are unremarkable. There is extensive bilateral perinephric and periureteric inflammatory stranding within the retroperitoneum which appears progressive since prior examination and extends into the pelvis where there is severe perivesicular inflammatory stranding and progressive bladder wall thickening. The findings can be seen in the setting aggressive infection. In the appropriate clinical setting, radiation therapy could also result in a similar appearance. No hydronephrosis. No intrarenal or ureteral calculi. The bladder is not distended. Stomach/Bowel: Moderate colonic stool burden. There is progressive pericolonic inflammatory stranding involving the distal rectosigmoid colon and rectum with asymmetric inflammatory changes within the left retroperitoneum along the left pelvic sidewall. There is, additionally, progressive soft tissue density within the presacral space measuring 3.1 x 3.1 x 5.7 cm at axial image # 74/2 and sagittal image # 71/6 which is not well characterized on this noncontrast examination but may represent developing phlegmon or an infiltrative mass. There is extensive circumferential rectal wall thickening involving the terminal rectum. The stomach, small bowel, and large bowel are otherwise  unremarkable. Appendix. Vascular/Lymphatic: Aortic atherosclerosis. No enlarged abdominal or pelvic lymph nodes. Reproductive: There is inflammatory stranding involving the root of the penis, again possibly related to changes local infection, as can be seen with cellulitis, or radiation therapy. The prostate gland is of normal size but difficult to discretely discern adjacent inflammatory change. Other: Stable dermal thickening within the anterior abdominal wall nonspecific. Postsurgical changes noted within the right inguinal region. Musculoskeletal: No acute bone abnormality. No lytic or blastic bone lesion. IMPRESSION: 1. Progressive pericolonic inflammatory stranding involving the distal rectosigmoid colon and rectum with asymmetric inflammatory changes within the left retroperitoneum along the left pelvic sidewall. There is, additionally, progressive soft tissue density within the presacral space measuring 3.1 x 3.1 x 5.7 cm which is not well characterized on this noncontrast examination but may represent developing phlegmon or an infiltrative mass. Dedicated contrast enhanced CT or MRI examination is recommended for further evaluation. 2. Progressive bilateral perinephric and periureteric inflammatory stranding within the retroperitoneum which extends into the pelvis where there is severe perivesicular inflammatory stranding and progressive bladder wall thickening.  The findings can be seen in the setting of aggressive infection and correlation with urinalysis and urine culture is recommended. In the appropriate clinical setting, radiation therapy could also result in a similar appearance. 3. Inflammatory stranding involving the root of the penis, again possibly related to changes local infection, as can be seen with cellulitis, or radiation therapy. 4. Extensive multi-vessel coronary artery calcification. Aortic Atherosclerosis (ICD10-I70.0). Electronically Signed   By: Helyn Numbers M.D.   On: 05/25/2023  23:35   CT Head Wo Contrast Result Date: 05/25/2023 CLINICAL DATA:  Mental status change EXAM: CT HEAD WITHOUT CONTRAST TECHNIQUE: Contiguous axial images were obtained from the base of the skull through the vertex without intravenous contrast. RADIATION DOSE REDUCTION: This exam was performed according to the departmental dose-optimization program which includes automated exposure control, adjustment of the mA and/or kV according to patient size and/or use of iterative reconstruction technique. COMPARISON:  CT 03/06/2018, MRI 08/02/2017 FINDINGS: Brain: No acute territorial infarction or hemorrhage is visualized. Interval mild ventricular enlargement compared to prior head CT but no significant progression of atrophy. Underlying chronic small vessel ischemic changes of the white matter but increased white matter hypodensity adjacent to the frontal and occipital horns. Small chronic appearing left capsular infarct extending into the white matter. Small chronic cerebellar infarcts. Possible small chronic right occipital infarct. Interval enlargement of left convexity slightly dense plaque like mass, measures about 6.3 cm AP x 17 cm transverse on series 2, image 13, previously 41 x 14 mm. No surrounding hypodense edema. No midline shift. Vascular: No hyperdense vessels.  Carotid vascular calcification Skull: Normal. Negative for fracture or focal lesion. Sinuses/Orbits: Mucosal thickening in the sinuses with small fluid level right sphenoid sinus Other: None IMPRESSION: 1. Negative for acute intracranial hemorrhage or midline shift. 2. Interval enlargement of left convexity slightly dense extra-axial mass, measures about 6.3 cm AP x 17 cm transverse, previously 41 x 14 mm. No surrounding hypodense edema. No midline shift. Findings previously characterized as probable meningioma 3. Interval mild ventricular enlargement compared to prior head CT but no significant progression of atrophy. Increased white matter  hypodensity adjacent to the frontal and occipital horns, difficult to exclude normal pressure hydrocephalus with transependymal flow of CSF. Suggest further assessment with MRI. Left convexity extra-axial enlarged mass could also be evaluated at MRI follow-up. 4. Small chronic infarcts as described above. 5. Mucosal thickening in the sinuses with small fluid level right sphenoid sinus, correlate for acute sinusitis. Electronically Signed   By: Jasmine Pang M.D.   On: 05/25/2023 21:46   DG Chest Port 1 View Result Date: 05/25/2023 CLINICAL DATA:  Weakness. EXAM: PORTABLE CHEST 1 VIEW COMPARISON:  November 16, 2022. FINDINGS: The heart size and mediastinal contours are within normal limits. Both lungs are clear. The visualized skeletal structures are unremarkable. IMPRESSION: No active disease. Electronically Signed   By: Lupita Raider M.D.   On: 05/25/2023 20:56      IMPRESSION AND PLAN:  Assessment and Plan: * AKI (acute kidney injury) (HCC) - This is likely prerenal due to volume depletion and dehydration recurrent nausea and vomiting. - The patient be admitted to a medical telemetry observation bed. - We will continue hydration with IV normal saline. - Will follow BMP. - We will avoid nephrotoxins.  Meningioma [facility]) - This is enlarging and could be contributing to his nausea and vomiting. - Neurology consultation can be obtained and called in a.m. - Will defer steroid therapy for his meningioma for the neurologist.  Pelvic mass Abdominal and pelvic CT scan without contrast revealed the following:  1. Progressive pericolonic inflammatory stranding involving the distal rectosigmoid colon and rectum with asymmetric inflammatory changes within the left retroperitoneum along the left pelvic sidewall. There is, additionally, progressive soft tissue density within the presacral space measuring 3.1 x 3.1 x 5.7 cm which is not well characterized on this noncontrast examination but may  represent developing phlegmon or an infiltrative mass. Dedicated contrast enhanced CT or MRI examination is recommended for further evaluation. 2. Progressive bilateral perinephric and periureteric inflammatory stranding within the retroperitoneum which extends into the pelvis where there is severe perivesicular inflammatory stranding and progressive bladder wall thickening. The findings can be seen in the setting of aggressive infection and correlation with urinalysis and urine culture is recommended. In the appropriate clinical setting, radiation therapy could also result in a similar appearance. 3. Inflammatory stranding involving the root of the penis, again possibly related to changes local infection, as can be seen with cellulitis, or radiation therapy.  - The patient will benefit from urology consult given his scrotal swelling though he has been following on an outpatient basis on with his urologist. - He will need IV antibiotic therapy with Rocephin for potential pyelonephritis. - He will need a general surgery and possibly IR consult at some point for further assessment of his suspected pelvic mass.       DVT prophylaxis: Lovenox.  Advanced Care Planning:  Code Status: full code.  Family Communication:  The plan of care was discussed in details with the patient (and family). I answered all questions. The patient agreed to proceed with the above mentioned plan. Further management will depend upon hospital course. Disposition Plan: Back to previous home environment Consults called: none.  All the records are reviewed and case discussed with ED provider.  Status is: Observation  I certify that at the time of admission, it is my clinical judgment that the patient will require  hospital care extending less than 2 midnights.                            Dispo: The patient is from: Home              Anticipated d/c is to: Home              Patient currently is not medically  stable to d/c.              Difficult to place patient: No  Hannah Beat M.D on 05/26/2023 at 3:46 AM  [facility]   From 7 PM-7 AM, contact night-coverage [email or web address]  CC: Primary care physician; Juliette Alcide, MD

## 2023-05-26 NOTE — Assessment & Plan Note (Addendum)
 On admission. - This is likely prerenal due to volume depletion and dehydration recurrent nausea and vomiting. - The patient be admitted to a medical telemetry observation bed. - We will continue hydration with IV normal saline. - Will follow BMP. - We will avoid nephrotoxins.  05-26-2023 Scr improved to 1.34. no further N/V. Will stop IVF. Allow for solid food.

## 2023-05-26 NOTE — Assessment & Plan Note (Addendum)
 On admission. Abdominal and pelvic CT scan without contrast revealed the following:   1. Progressive pericolonic inflammatory stranding involving the distal rectosigmoid colon and rectum with asymmetric inflammatory  changes within the left retroperitoneum along the left pelvic sidewall. There is, additionally, progressive soft tissue density within the presacral space measuring 3.1 x 3.1 x 5.7 cm which is not well characterized on this noncontrast examination but may represent developing phlegmon or an infiltrative mass. Dedicated contrast enhanced CT or MRI examination is recommended for further  evaluation. 2. Progressive bilateral perinephric and periureteric inflammatory stranding within the retroperitoneum which extends into the pelvis where there is severe perivesicular inflammatory stranding and  progressive bladder wall thickening. The findings can be seen in the setting of aggressive infection and correlation with urinalysis and urine culture is recommended. In the appropriate clinical setting,  radiation therapy could also result in a similar appearance. 3. Inflammatory stranding involving the root of the penis, again possibly related to changes local infection, as can be seen with cellulitis, or radiation therapy.  - The patient will benefit from urology consult given his scrotal swelling though he has been following on an outpatient basis on with his urologist. - He will need IV antibiotic therapy with Rocephin for potential pyelonephritis. - He will need a general surgery and possibly IR consult at some point for further assessment of his suspected pelvic mass.  05-26-2023 pt with presacral mass with rectal inflammation.   Have asked IR to look at CT and see if there is a window for biopsy.  With MRI brain findings, oncology would like LP for cytology and infectious workup.  Oncology agrees pt would benefit from transfer to [facility]. Pt's UA is negative for infection. He has been  treated with po abx for about 2 weeks now. I don't think his inflammation in his ureters/bladder is from infection but probably neoplastic.  I have stopped IV ABX.  Have communicated with Dr. Leonides Schanz with oncology. He thinks pt would be best served with transfer to [facility] so that pt can get biopsy of pre-sacral mass and LP for CNS lymphoma workup.  Pt may need to also  have urology consult if biopsy shows some sort of urological cancer. Pt's wife (vivian) and pt's mother aware of pt's need to transfer to [facility]. Have contacted IR at [facility]. They will arrange for CT biopsy and LP at [facility].

## 2023-05-26 NOTE — Assessment & Plan Note (Signed)
 05-26-2023 continue imdur and coreg

## 2023-05-26 NOTE — Assessment & Plan Note (Signed)
 05-26-2023 stable. Stop IVF. Last LVEF 04-2022 shows LVEF 55-60%.

## 2023-05-26 NOTE — Inpatient Diabetes Management (Addendum)
 Inpatient Diabetes Program Recommendations  AACE/ADA: New Consensus Statement on Inpatient Glycemic Control   Target Ranges:  Prepandial:   less than 140 mg/dL      Peak postprandial:   less than 180 mg/dL (1-2 hours)      Critically ill patients:  140 - 180 mg/dL    Latest Reference Range & Units 05/25/23 18:19 05/26/23 07:46  Glucose-Capillary 70 - 99 mg/dL 161 (H) 096 (H)   Review of Glycemic Control  Diabetes history: DM2 Outpatient Diabetes medications: Humulin R U500 60 units with breakfast, lunch, and supper, Farxiga 10 mg QAM, Metformin 500 mg QAM Current orders for Inpatient glycemic control: Humulin R U500 60 units as directed, Novolog 0-15 units TID with meals, Novolog 0-5 units at bedtime, Farxiga 10 mg daily  Inpatient Diabetes Program Recommendations:    DM medications: Please consider discontinuing order fur Humulin R U500, change CBGs to Q4H and Novolog to 0-20 units Q4H. Would also recommend to discontinue Farxiga at this time.   05/26/23@12 :10-Talked with patient briefly at bedside regarding DM at 11:25. Patient could not provide answers to questions regarding DM control and amount of insulin he is taking. Called patient's wife at 12:10 and spoke with her over the phone. She states patient does everything himself with DM medications and monitoring glucose. She has his medication list and it says to take Humulin R U500 60 units with breakfast, 60 units with lunch, and 60 units with supper if CBG is over 90 mg/dl, Farxiga 10 mg daily, and Metformin 500 mg QAM. She states that patient has not taken any medications in the past 2 days at home. She states he uses a [employer] CGM but she does not know how his glucose usually runs because he does that himself.  Discussed that currently he is only ordered Novolog for correction and glucose trends will be followed to determine if additional insulin needs to be added. She has no questions at this time related to DM.  Thanks, Orlando Penner, RN, MSN, CDCES Diabetes Coordinator Inpatient Diabetes Program [phone] (Team Pager from 8am to 5pm)

## 2023-05-26 NOTE — Assessment & Plan Note (Signed)
 05-28-2023 chronic. Stable.

## 2023-05-26 NOTE — Assessment & Plan Note (Signed)
 05-26-2023 continue with SSI.

## 2023-05-26 NOTE — Evaluation (Signed)
 Physical Therapy Evaluation Patient Details Name: Alex Cortez MRN: [id number] DOB: February 22, 1951 Today's Date: 05/26/2023  History of Present Illness  Alex Cortez is a 73 y.o. male with medical history significant for coronary artery disease, type  2 diabetes mellitus, dyslipidemia, essential hypertension, CVA and coronary artery disease, who presented to the emergency room with a Kalisetti of lower abdominal pain with associated nausea and vomiting without diarrhea.  No bilious vomitus or hematemesis.  No dysuria, oliguria or hematuria or flank pain.  No fever or chills.  No cough or wheezing or dyspnea.  No chest pain or palpitations.  He has been having altered mental status with mild confusion.  He denied any paresthesias or focal muscle weakness.  No witnessed seizures.  No urinary or stool incontinence.  He was seen on outpatient basis a couple weeks ago and started on p.o. Bactrim for suspected scrotal cellulitis.  He has been having scrotal and penile edema over the last several weeks.  When he did not tolerate Bactrim he was switched to p.o doxycycline and he recently finished it.  He has not gotten out of bed over the last 24 hours and has been having significant oliguria and feeling very weak.  He was initially unable to answer questions appropriately and was more appropriate during my interview.  No fever or chills..  Clinical Impression  Pt presents with decreased cognition and decreased safety awareness during transfers and ambulation. Pt demonstrates decreased LE strength and balance needing 2WRW for ambulation and STS transfer. Pt would continue to benefit from skilled acute PT for increased LE strengthening, improve gait pattern, and improved endurance for improved independence with ambulation and completion of ADLs. Pt recommended for SNF at this time.        If plan is discharge home, recommend the following: A lot of help with walking and/or transfers;Help with stairs or ramp  for entrance;A little help with bathing/dressing/bathroom;Supervision due to cognitive status   Can travel by private vehicle   No    Equipment Recommendations    Recommendations for Other Services       Functional Status Assessment Patient has had a recent decline in their functional status and demonstrates the ability to make significant improvements in function in a reasonable and predictable amount of time.     Precautions / Restrictions Precautions Precautions: None Restrictions Weight Bearing Restrictions Per Provider Order: No      Mobility  Bed Mobility Overal bed mobility: Needs Assistance Bed Mobility: Supine to Sit     Supine to sit: Mod assist     General bed mobility comments: +1 assist    Transfers Overall transfer level: Needs assistance Equipment used: Rolling walker (2 wheels) Transfers: Bed to chair/wheelchair/BSC, Sit to/from Stand Sit to Stand: Mod assist           General transfer comment: requires VC    Ambulation/Gait Ambulation/Gait assistance: Mod assist Gait Distance (Feet): 30 Feet Assistive device: Rolling walker (2 wheels) Gait Pattern/deviations: Decreased step length - right, Decreased step length - left, Decreased stride length, Wide base of support Gait velocity: slow     General Gait Details: heavily reliant on UE support, confused about IV line  Stairs            Wheelchair Mobility     Tilt Bed    Modified Rankin (Stroke Patients Only)       Balance Overall balance assessment: Needs assistance Sitting-balance support: Bilateral upper extremity supported  Standing balance-Leahy Scale: Fair                               Pertinent Vitals/Pain Pain Assessment Pain Assessment: No/denies pain    Home Living Family/patient expects to be discharged to:: Private residence Living Arrangements: Spouse/significant other Available Help at Discharge: Family (wife states she is unable to  care for him now) Type of Home: House Home Access: Stairs to enter Entrance Stairs-Rails: Right Entrance Stairs-Number of Steps: 4 Alternate Level Stairs-Number of Steps: 13 to 2nd floor with right side rail, 10 to basement with bilateral side rails Home Layout: Two level;Able to live on main level with bedroom/bathroom;Full bath on main level Home Equipment: Cane - single point;Shower seat - built in      Prior Function Prior Level of Function : Needs assist  Cognitive Assist : Mobility (cognitive);ADLs (cognitive) Mobility (Cognitive): Intermittent cues ADLs (Cognitive): Intermittent cues Physical Assist : Mobility (physical) Mobility (physical): Bed mobility;Transfers;Gait;Stairs           Extremity/Trunk Assessment   Upper Extremity Assessment Upper Extremity Assessment: Defer to OT evaluation    Lower Extremity Assessment Lower Extremity Assessment: Generalized weakness (ROM WFL)    Cervical / Trunk Assessment Cervical / Trunk Assessment: Kyphotic  Communication   Communication Communication: No apparent difficulties    Cognition Arousal: Alert     PT - Cognitive impairments: Orientation, Awareness, Attention, Safety/Judgement                       PT - Cognition Comments: pt seems out of it, nurse states to barricade him in chair as he was crawling out of the bed this morning Following commands: Impaired Following commands impaired: Only follows one step commands consistently     Cueing Cueing Techniques: Verbal cues, Tactile cues     General Comments      Exercises     Assessment/Plan    PT Assessment Patient needs continued PT services  PT Problem List Decreased strength;Decreased knowledge of use of DME;Decreased range of motion;Decreased activity tolerance;Decreased safety awareness;Decreased balance;Decreased mobility;Decreased cognition       PT Treatment Interventions DME instruction;Gait training;Stair training;Functional mobility  training;Therapeutic activities;Therapeutic exercise;Balance training;Neuromuscular re-education;Patient/family education    PT Goals (Current goals can be found in the Care Plan section)  Acute Rehab PT Goals Patient Stated Goal: to go to rehab to get stronger before returning home PT Goal Formulation: With patient/family Time For Goal Achievement: 06/09/23 Potential to Achieve Goals: Good    Frequency Min 3X/week     Co-evaluation PT/OT/SLP Co-Evaluation/Treatment: Yes Reason for Co-Treatment: Complexity of the patient's impairments (multi-system involvement);Necessary to address cognition/behavior during functional activity;For patient/therapist safety;To address functional/ADL transfers PT goals addressed during session: Mobility/safety with mobility;Balance;Strengthening/ROM         AM-PAC PT "6 Clicks" Mobility  Outcome Measure Help needed turning from your back to your side while in a flat bed without using bedrails?: A Little Help needed moving from lying on your back to sitting on the side of a flat bed without using bedrails?: A Little Help needed moving to and from a bed to a chair (including a wheelchair)?: A Lot Help needed standing up from a chair using your arms (e.g., wheelchair or bedside chair)?: A Lot Help needed to walk in hospital room?: A Lot Help needed climbing 3-5 steps with a railing? : Total 6 Click Score: 13    End of  Session Equipment Utilized During Treatment: Gait belt Activity Tolerance: Patient tolerated treatment well;Patient limited by fatigue Patient left: in chair;with call bell/phone within reach;with chair alarm set;with family/visitor present   PT Visit Diagnosis: Unsteadiness on feet (R26.81);Other abnormalities of gait and mobility (R26.89);Muscle weakness (generalized) (M62.81);History of falling (Z91.81);Difficulty in walking, not elsewhere classified (R26.2)    Time: 1610-9604 PT Time Calculation (min) (ACUTE ONLY): 15  min   Charges:   PT Evaluation $PT Eval Moderate Complexity: 1 Mod PT Treatments $Therapeutic Activity: 8-22 mins PT General Charges $$ ACUTE PT VISIT: 1 Visit         Luz Lex, PT, DPT [facility] Office: [phone]

## 2023-05-26 NOTE — Progress Notes (Signed)
 Marland Kitchen

## 2023-05-26 NOTE — Care Management Obs Status (Signed)
 MEDICARE OBSERVATION STATUS NOTIFICATION   Patient Details  Name: Alex Cortez MRN: [id number] Date of Birth: 1951/01/17   Medicare Observation Status Notification Given:  Yes    Diona Browner, LCSW 05/26/2023, 4:30 PM

## 2023-05-26 NOTE — Hospital Course (Addendum)
 BINNIE VONDERHAAR is 73 y.o. male with B cell lymphoma CAD, type 2 diabetes, hypertension, history of CVA, history of frontotemporal meningioma, initially admitted to [facility] for progressive weakness and decreasing ability to walk over the week prior. Workup revealed progressive pericolonic inflammatory stranding and a 3 x 6 soft tissue density within the presacral space.  MRI brain revealed a 6 cm extra-axial mass likely meningioma.  Patient was transferred to [facility] for biopsy of presacral area.  Biopsy resulted for B-cell lymphoma. His confusion became progressively worse.  Brain MRI was performed which was notable for abnormal leptomeningeal enhancement which could reveal infectious or lymphomatous meningitis.  He was also noted to have a 13 mm area of enhancement in the inferior left cerebellum differential including subacute infarct or CNS lymphoma.  Neurology was consulted.  He was initiated on empiric meningitis coverage and underwent LP with notable lymphocytic pleocytosis, elevated CSF protein, normal CSF glucose.  CSF culture and bio fire panel was negative, and antibiotics were de-escalated.  His confusion has persisted despite antibiotics.  Ultimately CSF resulted for B-cell lymphoma. Given recurrence of his B-cell lymphoma, oncology was consulted and discussed directly with [facility] lymphoma team but unfortunately there are no further interventions available to him.  Palliative care was consulted and ultimately family decided to proceed with hospice.  Stay was prolonged pending hospice arrangements.  Patient was treated as comfort measures only while in the hospital.  He is discharging today directly to hospice facility.

## 2023-05-27 ENCOUNTER — Inpatient Hospital Stay (HOSPITAL_COMMUNITY)

## 2023-05-27 DIAGNOSIS — G9349 Other encephalopathy: Secondary | ICD-10-CM | POA: Diagnosis present

## 2023-05-27 DIAGNOSIS — Z7189 Other specified counseling: Secondary | ICD-10-CM | POA: Diagnosis not present

## 2023-05-27 DIAGNOSIS — M6281 Muscle weakness (generalized): Secondary | ICD-10-CM | POA: Diagnosis not present

## 2023-05-27 DIAGNOSIS — Z8572 Personal history of non-Hodgkin lymphomas: Secondary | ICD-10-CM

## 2023-05-27 DIAGNOSIS — Z515 Encounter for palliative care: Secondary | ICD-10-CM | POA: Diagnosis not present

## 2023-05-27 DIAGNOSIS — E1159 Type 2 diabetes mellitus with other circulatory complications: Secondary | ICD-10-CM | POA: Diagnosis not present

## 2023-05-27 DIAGNOSIS — R1900 Intra-abdominal and pelvic swelling, mass and lump, unspecified site: Secondary | ICD-10-CM | POA: Diagnosis not present

## 2023-05-27 DIAGNOSIS — I2510 Atherosclerotic heart disease of native coronary artery without angina pectoris: Secondary | ICD-10-CM | POA: Diagnosis not present

## 2023-05-27 DIAGNOSIS — R410 Disorientation, unspecified: Secondary | ICD-10-CM | POA: Diagnosis not present

## 2023-05-27 DIAGNOSIS — E7800 Pure hypercholesterolemia, unspecified: Secondary | ICD-10-CM | POA: Diagnosis present

## 2023-05-27 DIAGNOSIS — K6289 Other specified diseases of anus and rectum: Secondary | ICD-10-CM | POA: Diagnosis present

## 2023-05-27 DIAGNOSIS — N5089 Other specified disorders of the male genital organs: Secondary | ICD-10-CM | POA: Diagnosis not present

## 2023-05-27 DIAGNOSIS — R531 Weakness: Secondary | ICD-10-CM | POA: Diagnosis not present

## 2023-05-27 DIAGNOSIS — R569 Unspecified convulsions: Secondary | ICD-10-CM | POA: Diagnosis not present

## 2023-05-27 DIAGNOSIS — C83398 Diffuse large b-cell lymphoma of other extranodal and solid organ sites: Secondary | ICD-10-CM | POA: Diagnosis present

## 2023-05-27 DIAGNOSIS — R4182 Altered mental status, unspecified: Secondary | ICD-10-CM

## 2023-05-27 DIAGNOSIS — I69322 Dysarthria following cerebral infarction: Secondary | ICD-10-CM | POA: Diagnosis not present

## 2023-05-27 DIAGNOSIS — B957 Other staphylococcus as the cause of diseases classified elsewhere: Secondary | ICD-10-CM | POA: Diagnosis present

## 2023-05-27 DIAGNOSIS — Z66 Do not resuscitate: Secondary | ICD-10-CM | POA: Diagnosis not present

## 2023-05-27 DIAGNOSIS — N4889 Other specified disorders of penis: Secondary | ICD-10-CM | POA: Diagnosis present

## 2023-05-27 DIAGNOSIS — D329 Benign neoplasm of meninges, unspecified: Secondary | ICD-10-CM | POA: Diagnosis not present

## 2023-05-27 DIAGNOSIS — N179 Acute kidney failure, unspecified: Secondary | ICD-10-CM | POA: Diagnosis present

## 2023-05-27 DIAGNOSIS — I6930 Unspecified sequelae of cerebral infarction: Secondary | ICD-10-CM | POA: Diagnosis not present

## 2023-05-27 DIAGNOSIS — D321 Benign neoplasm of spinal meninges: Secondary | ICD-10-CM | POA: Diagnosis present

## 2023-05-27 DIAGNOSIS — I110 Hypertensive heart disease with heart failure: Secondary | ICD-10-CM | POA: Diagnosis present

## 2023-05-27 DIAGNOSIS — I700 Atherosclerosis of aorta: Secondary | ICD-10-CM | POA: Diagnosis present

## 2023-05-27 DIAGNOSIS — C8336 Diffuse large B-cell lymphoma, intrapelvic lymph nodes: Secondary | ICD-10-CM | POA: Diagnosis present

## 2023-05-27 DIAGNOSIS — I10 Essential (primary) hypertension: Secondary | ICD-10-CM | POA: Diagnosis not present

## 2023-05-27 DIAGNOSIS — C8350 Lymphoblastic (diffuse) lymphoma, unspecified site: Secondary | ICD-10-CM | POA: Diagnosis not present

## 2023-05-27 DIAGNOSIS — Z7401 Bed confinement status: Secondary | ICD-10-CM | POA: Diagnosis not present

## 2023-05-27 DIAGNOSIS — D830 Common variable immunodeficiency with predominant abnormalities of B-cell numbers and function: Secondary | ICD-10-CM | POA: Diagnosis not present

## 2023-05-27 DIAGNOSIS — E669 Obesity, unspecified: Secondary | ICD-10-CM | POA: Diagnosis present

## 2023-05-27 DIAGNOSIS — I5032 Chronic diastolic (congestive) heart failure: Secondary | ICD-10-CM | POA: Diagnosis present

## 2023-05-27 DIAGNOSIS — Z1152 Encounter for screening for COVID-19: Secondary | ICD-10-CM | POA: Diagnosis not present

## 2023-05-27 DIAGNOSIS — R7881 Bacteremia: Secondary | ICD-10-CM | POA: Diagnosis present

## 2023-05-27 DIAGNOSIS — E039 Hypothyroidism, unspecified: Secondary | ICD-10-CM | POA: Diagnosis present

## 2023-05-27 DIAGNOSIS — R222 Localized swelling, mass and lump, trunk: Secondary | ICD-10-CM | POA: Diagnosis not present

## 2023-05-27 DIAGNOSIS — C8330 Diffuse large B-cell lymphoma, unspecified site: Secondary | ICD-10-CM | POA: Diagnosis not present

## 2023-05-27 DIAGNOSIS — C8339 Primary central nervous system lymphoma: Secondary | ICD-10-CM | POA: Diagnosis present

## 2023-05-27 DIAGNOSIS — I959 Hypotension, unspecified: Secondary | ICD-10-CM | POA: Diagnosis not present

## 2023-05-27 DIAGNOSIS — E1169 Type 2 diabetes mellitus with other specified complication: Secondary | ICD-10-CM | POA: Diagnosis not present

## 2023-05-27 DIAGNOSIS — E1151 Type 2 diabetes mellitus with diabetic peripheral angiopathy without gangrene: Secondary | ICD-10-CM | POA: Diagnosis present

## 2023-05-27 DIAGNOSIS — E860 Dehydration: Secondary | ICD-10-CM | POA: Diagnosis present

## 2023-05-27 DIAGNOSIS — Z794 Long term (current) use of insulin: Secondary | ICD-10-CM | POA: Diagnosis not present

## 2023-05-27 LAB — CSF CELL COUNT WITH DIFFERENTIAL
Monocyte-Macrophage-Spinal Fluid: 2 % — ABNORMAL LOW (ref 15–45)
Monocyte-Macrophage-Spinal Fluid: 3 % — ABNORMAL LOW (ref 15–45)
Other Cells, CSF: 89
Other Cells, CSF: 94
RBC Count, CSF: 12 /mm3 — ABNORMAL HIGH
RBC Count, CSF: 250 /mm3 — ABNORMAL HIGH
Segmented Neutrophils-CSF: 4 % (ref 0–6)
Segmented Neutrophils-CSF: 7 % — ABNORMAL HIGH (ref 0–6)
Tube #: 1
Tube #: 4
WBC, CSF: 510 /mm3 (ref 0–5)
WBC, CSF: 595 /mm3 (ref 0–5)

## 2023-05-27 LAB — GLUCOSE, CAPILLARY
Glucose-Capillary: 103 mg/dL — ABNORMAL HIGH (ref 70–99)
Glucose-Capillary: 119 mg/dL — ABNORMAL HIGH (ref 70–99)
Glucose-Capillary: 132 mg/dL — ABNORMAL HIGH (ref 70–99)
Glucose-Capillary: 148 mg/dL — ABNORMAL HIGH (ref 70–99)
Glucose-Capillary: 75 mg/dL (ref 70–99)
Glucose-Capillary: 88 mg/dL (ref 70–99)

## 2023-05-27 LAB — PROTEIN AND GLUCOSE, CSF
Glucose, CSF: 46 mg/dL (ref 40–70)
Total  Protein, CSF: 170 mg/dL — ABNORMAL HIGH (ref 15–45)

## 2023-05-27 MED ORDER — LIDOCAINE HCL (PF) 1 % IJ SOLN
INTRAMUSCULAR | Status: AC | PRN
  Administered 2023-05-27: 5 mL

## 2023-05-27 MED ORDER — MIDAZOLAM HCL 2 MG/2ML IJ SOLN
INTRAMUSCULAR | Status: AC | PRN
  Administered 2023-05-27: .5 mg via INTRAVENOUS

## 2023-05-27 MED ORDER — CARVEDILOL 6.25 MG PO TABS
6.2500 mg | ORAL_TABLET | Freq: Two times a day (BID) | ORAL | Status: DC
  Administered 2023-05-28 (×2): 6.25 mg via ORAL
  Filled 2023-05-27: qty 1
  Filled 2023-05-27: qty 2

## 2023-05-27 MED ORDER — SODIUM CHLORIDE 0.9 % IV SOLN
INTRAVENOUS | Status: AC
  Filled 2023-05-27: qty 500

## 2023-05-27 MED ORDER — LIDOCAINE HCL (PF) 1 % IJ SOLN
INTRAMUSCULAR | Status: AC | PRN
  Administered 2023-05-27: 2 mL via INTRADERMAL

## 2023-05-27 MED ORDER — ENOXAPARIN SODIUM 40 MG/0.4ML IJ SOSY
40.0000 mg | PREFILLED_SYRINGE | Freq: Every day | INTRAMUSCULAR | Status: DC
  Administered 2023-05-28 – 2023-06-02 (×6): 40 mg via SUBCUTANEOUS
  Filled 2023-05-27 (×6): qty 0.4

## 2023-05-27 MED ORDER — NALOXONE HCL 0.4 MG/ML IJ SOLN
INTRAMUSCULAR | Status: AC
  Filled 2023-05-27: qty 1

## 2023-05-27 MED ORDER — MIDAZOLAM HCL 2 MG/2ML IJ SOLN
INTRAMUSCULAR | Status: AC | PRN
Start: 2023-05-27 — End: 2023-05-27
  Administered 2023-05-27: .5 mg via INTRAVENOUS

## 2023-05-27 MED ORDER — HALOPERIDOL LACTATE 5 MG/ML IJ SOLN
5.0000 mg | Freq: Three times a day (TID) | INTRAMUSCULAR | Status: DC | PRN
  Administered 2023-05-27 – 2023-06-02 (×4): 5 mg via INTRAMUSCULAR
  Filled 2023-05-27 (×4): qty 1

## 2023-05-27 MED ORDER — CLOPIDOGREL BISULFATE 75 MG PO TABS
75.0000 mg | ORAL_TABLET | Freq: Every day | ORAL | Status: DC
  Administered 2023-05-28 – 2023-06-05 (×9): 75 mg via ORAL
  Filled 2023-05-27 (×10): qty 1

## 2023-05-27 MED ORDER — FENTANYL CITRATE (PF) 100 MCG/2ML IJ SOLN
INTRAMUSCULAR | Status: AC | PRN
  Administered 2023-05-27: 25 ug via INTRAVENOUS

## 2023-05-27 MED ORDER — MIDAZOLAM HCL 2 MG/2ML IJ SOLN
INTRAMUSCULAR | Status: AC
  Filled 2023-05-27: qty 4

## 2023-05-27 MED ORDER — FENTANYL CITRATE (PF) 100 MCG/2ML IJ SOLN
INTRAMUSCULAR | Status: AC
Start: 2023-05-27 — End: ?
  Filled 2023-05-27: qty 4

## 2023-05-27 MED ORDER — HALOPERIDOL 5 MG PO TABS
5.0000 mg | ORAL_TABLET | Freq: Three times a day (TID) | ORAL | Status: DC | PRN
  Administered 2023-05-29 – 2023-06-06 (×4): 5 mg via ORAL
  Filled 2023-05-27 (×5): qty 1

## 2023-05-27 MED ORDER — FLUMAZENIL 0.5 MG/5ML IV SOLN
INTRAVENOUS | Status: AC
  Filled 2023-05-27: qty 5

## 2023-05-27 MED ORDER — SODIUM CHLORIDE 0.9 % IV SOLN: INTRAVENOUS | Status: AC

## 2023-05-27 NOTE — Progress Notes (Signed)
 Hematology/Oncology Progress Note  Clinical Summary: [name] is a 73 year old male with medical history significant for diffuse large B-cell lymphoma in remission who presents with evaluation of altered mental status was found to have concern for meningitis versus CNS recurrence of his lymphoma.  Interval History: -- Alex Cortez presented to the emergency department at any [facility] on 05/25/2023 with confusion, weakness, and poor urine production. -- Patient has been struggling with scrotal cellulitis, recently treated with Bactrim therapy by his primary care provider. -- MRI brain on 05/26/2023 showed abnormal leptomeningeal enhancement, which could be infectious or lymphomatous.  There was also a 13 mm area of enhancement in the inferior left cerebellum with concerns for possible subacute infarct versus CNS lymphoma.  -- CT of the abdomen performed on 05/25/2023 showed a progressive soft tissue density within the presacral space, measuring 3.1 x 3.1 x 5.7 cm. -- Today patient underwent IR guided biopsy of the presacral mass as well as a lumbar puncture. -- Labs on 05/26/2023 showed white blood cell 5.5, hemoglobin 12.3, MCV 82.2, platelets 189.  Creatinine 1.34  O:  Vitals:   05/27/23 1546 05/27/23 1615  BP: (!) 95/55 (!) 107/52  Pulse: 64 64  Resp: 19   Temp: 98.6 F (37 C)   SpO2: 97%       Latest Ref Rng & Units 05/26/2023    4:14 AM 05/25/2023    7:45 PM 05/06/2023    9:41 PM  CMP  Glucose 70 - 99 mg/dL 161  096  045   BUN 8 - 23 mg/dL 35  34  18   Creatinine 0.61 - 1.24 mg/dL 4.09  8.11  9.14   Sodium 135 - 145 mmol/L 141  137  142   Potassium 3.5 - 5.1 mmol/L 4.5  5.4  4.2   Chloride 98 - 111 mmol/L 110  104  106   CO2 22 - 32 mmol/L 20  20  23    Calcium 8.9 - 10.3 mg/dL 9.0  9.7  9.1   Total Protein 6.5 - 8.1 g/dL  8.1  7.4   Total Bilirubin 0.0 - 1.2 mg/dL  1.4  1.0   Alkaline Phos 38 - 126 U/L  61  46   AST 15 - 41 U/L  20  32   ALT 0 - 44 U/L  28  34        Latest Ref Rng & Units 05/26/2023    4:14 AM 05/25/2023    7:45 PM 05/06/2023    9:41 PM  CBC  WBC 4.0 - 10.5 K/uL 5.5  6.1  5.4   Hemoglobin 13.0 - 17.0 g/dL 78.2  95.6  21.3   Hematocrit 39.0 - 52.0 % 38.9  43.2  38.5   Platelets 150 - 400 K/uL 189  244  223       GENERAL: Somnolent elderly African-American male in NAD  SKIN: skin color, texture, turgor are normal, no rashes or significant lesions EYES: conjunctiva are pink and non-injected, sclera clear LUNGS: clear to auscultation and percussion with normal breathing effort HEART: regular rate & rhythm and no murmurs and no lower extremity edema ABDOMEN: soft, non-tender, non-distended, normal bowel sounds Musculoskeletal: no cyanosis of digits and no clubbing  PSYCH: unable to assess due to somnolence.   Assessment/Plan: Alex Cortez is a 73 year old male with medical history significant for diffuse large B-cell lymphoma in remission who presents with evaluation of altered mental status was found to have concern for meningitis  versus CNS recurrence of his lymphoma.  # Acute Alerted Mental Status # Concern for Leptomeningeal Recurrence of DLBCL vs Infection -- Awaiting results of lumbar puncture and biopsy of soft tissue mass in the presacral region. -- At this time findings are concerning for possible recurrence in the CNS of the patient's diffuse large B-cell lymphoma. -- Of note the patient has been in remission since 10/25/2022, with his last chemo in June 2024 -- No concerning abnormalities noted in his blood counts today. -- If the patient is found to have a CNS recurrence of his diffuse large B-cell lymphoma would recommend palliative care consult with goals of care discussion. --Oncology service will continue to follow.    Ulysees Barns, MD Department of Hematology/Oncology [facility] Phone: [phone] Pager: [phone] Email: [email or web address]

## 2023-05-27 NOTE — Progress Notes (Signed)
 PROGRESS NOTE    Alex Cortez  WUJ:[id number]  DOB: [birth year]  DOA: 05/25/2023 PCP: Juliette Alcide, MD Outpatient Specialists:   Hospital course:  73 year old man with CAD, DM 2, HTN and H/oh CVA was admitted to [facility] for progressive weakness and decreasing ability to walk over the past week.  Workup revealed progressive pericolonic inflammatory stranding and a 3 x 6 soft tissue density within the presacral space which may be a phlegmon or an infiltrative mass.  In addition MRI brain showed 6 cm extra axial mass that is likely a meningioma.  Patient is transferred to [facility] for biopsy of presacral area and for LP   Subjective:  Patient himself has no complaints.  Patient's wife is at bedside and has no questions.   Objective: Vitals:   05/27/23 1510 05/27/23 1515 05/27/23 1546 05/27/23 1615  BP: 118/78 110/70 (!) 95/55 (!) 107/52  Pulse: 76 74 64 64  Resp: 13 13 19    Temp:   98.6 F (37 C)   TempSrc:      SpO2: 100% 100% 97%   Weight:      Height:        Intake/Output Summary (Last 24 hours) at 05/27/2023 1709 Last data filed at 05/27/2023 9562 Gross per 24 hour  Intake --  Output 800 ml  Net -800 ml   Filed Weights   05/25/23 1746 05/26/23 0002  Weight: 112 kg 103.1 kg     Exam:  General: Patient lying in bed getting IV placed looking somewhat confused with attentive wife at bedside Eyes: sclera anicteric, conjuctiva mild injection bilaterally CVS: S1-S2, regular  Respiratory:  decreased air entry bilaterally secondary to decreased inspiratory effort, rales at bases  GI: NABS, soft, NT  LE: Warm and well-perfused  Data Reviewed:  Basic Metabolic Panel: Recent Labs  Lab 05/25/23 1945 05/26/23 0414  NA 137 141  K 5.4* 4.5  CL 104 110  CO2 20* 20*  GLUCOSE 212* 139*  BUN 34* 35*  CREATININE 1.79* 1.34*  CALCIUM 9.7 9.0    CBC: Recent Labs  Lab 05/25/23 1945 05/26/23 0414  WBC 6.1 5.5  NEUTROABS 3.6  --   HGB 13.6 12.3*   HCT 43.2 38.9*  MCV 81.7 82.2  PLT 244 189     Scheduled Meds:  allopurinol  300 mg Oral Daily   carvedilol  12.5 mg Oral BID WC   [START ON 05/28/2023] clopidogrel  75 mg Oral Daily   [START ON 05/28/2023] enoxaparin (LOVENOX) injection  40 mg Subcutaneous Daily   ezetimibe  10 mg Oral Daily   insulin aspart  0-20 Units Subcutaneous Q4H   isosorbide mononitrate  30 mg Oral Daily   levothyroxine  75 mcg Oral Q0600   rosuvastatin  40 mg Oral Daily   tamsulosin  0.4 mg Oral Daily   Continuous Infusions:   Assessment & Plan:    Altered mental status with confusion Abnormal MRI  Diffuse large B-cell lymphoma Patient with abnormal leptomeningeal enhancement which could be infectious or lymphomatosis meningitis given recent DLBCL.  Dr. Leonides Schanz is aware and will follow As noted above, patient is afebrile with no leukocytosis He has had recent confusion usually alert and oriented x 4 at baseline LP done via IR and results are pending Will consult neurology  Patient with as needed Haldol and sitter as needed for safety  Presacral mass 3 x 5 cm soft tissue density in presacral area, phlegmon versus infiltrative mass Of note patient  has recently been treated for orchitis and still has scrotal swelling Patient is afebrile and has no leukocytosis, UA is negative Patient underwent biopsy by IR today, results pending Patient has been discussed with Dr. Leonides Schanz and oncology in case biopsy shows some urological cell cancer  AKI Patient presently n.p.o., will start low-dose IVF and follow creatinine  Scrotal edema Recent treatment for orchitis BPH Can consider urology consult either as inpatient or outpatient Continue tamsulosin  DM2 Continue SSI  HFpEF HTN CAD Echo February 2024 shows EF 55 to 60% Appears euvolemic at present Blood pressures are low, will hold Imdur and decrease carvedilol to 6.25 twice daily Start maintenance IV fluids  H/oh CVA Continue Plavix    DVT  prophylaxis: Lovenox Code Status: Full Family Communication: Patient's wife was at bedside throughout     Studies: CT BIOPSY Result Date: 05/27/2023 CLINICAL DATA:  Mental status change, history of diffuse large B-cell lymphoma, presacral pelvic mass noted on recent CT pelvis EXAM: CT GUIDED CORE BIOPSY OF PRESACRAL MASS CT-GUIDED LUMBAR PUNCTURE ANESTHESIA/SEDATION: Intravenous Fentanyl and Versed 1.5mg  were administered by RN during a total moderate (conscious) sedation time of 20 minutes; the patient's level of consciousness and physiological / cardiorespiratory status were monitored continuously by radiology RN under my direct supervision. PROCEDURE: The procedure risks, benefits, and alternatives were explained to the spouse, due to the altered mental status. Questions regarding the procedure were encouraged and answered. The spouse understands and consents to the procedure. Patient placed prone. Select axial scans through the lumbosacral spine obtained. Initially, an appropriate skin entry site was determined for lumbar puncture. Skin prepped with Betadine, draped in usual sterile fashion, infiltrated locally with 1% lidocaine. Under CT fluoroscopic guidance, a 20 gauge spinal needle advanced into the lower lumbar thecal sac. Clear colorless CSF return. 6 mL were collected among for sterile bowels for the requested laboratory studies. Needle was removed and Band-Aid placed. Based on the initial scout images, a an appropriate skin entry site for presacral biopsy was then determined and marked on the skin. Region prepped with chlorhexidine, draped in usual sterile fashion, infiltrated locally with 1% lidocaine. Under CT fluoroscopic guidance, a 17 gauge trocar needle was advanced to the margin of the lesion. Once needle tip position was confirmed, coaxial 18-gauge core biopsy samples were obtained, submitted in saline to surgical pathology. The guide needle was removed. . Postprocedure scans show  no hemorrhage or other apparent complication. COMPLICATIONS: None immediate FINDINGS: Clear colorless CSF returned on lumbar puncture, 6 mL collected for the requested laboratory studies. The presacral mass was localized and representative 18 gauge core biopsy samples obtained as above, submitted in saline to surgical pathology. IMPRESSION: 1. Technically successful lumbar puncture under CT. 2. Technically successful presacral mass core biopsy under CT guidance. RADIATION DOSE REDUCTION: This exam was performed according to the departmental dose-optimization program which includes automated exposure control, adjustment of the mA and/or kV according to patient size and/or use of iterative reconstruction technique. Electronically Signed   By: Corlis Leak M.D.   On: 05/27/2023 16:56   CT GUIDED NEEDLE PLACEMENT Result Date: 05/27/2023 CLINICAL DATA:  Mental status change, history of diffuse large B-cell lymphoma, presacral pelvic mass noted on recent CT pelvis EXAM: CT GUIDED CORE BIOPSY OF PRESACRAL MASS CT-GUIDED LUMBAR PUNCTURE ANESTHESIA/SEDATION: Intravenous Fentanyl and Versed 1.5mg  were administered by RN during a total moderate (conscious) sedation time of 20 minutes; the patient's level of consciousness and physiological / cardiorespiratory status were monitored continuously by radiology  RN under my direct supervision. PROCEDURE: The procedure risks, benefits, and alternatives were explained to the spouse, due to the altered mental status. Questions regarding the procedure were encouraged and answered. The spouse understands and consents to the procedure. Patient placed prone. Select axial scans through the lumbosacral spine obtained. Initially, an appropriate skin entry site was determined for lumbar puncture. Skin prepped with Betadine, draped in usual sterile fashion, infiltrated locally with 1% lidocaine. Under CT fluoroscopic guidance, a 20 gauge spinal needle advanced into the lower lumbar thecal  sac. Clear colorless CSF return. 6 mL were collected among for sterile bowels for the requested laboratory studies. Needle was removed and Band-Aid placed. Based on the initial scout images, a an appropriate skin entry site for presacral biopsy was then determined and marked on the skin. Region prepped with chlorhexidine, draped in usual sterile fashion, infiltrated locally with 1% lidocaine. Under CT fluoroscopic guidance, a 17 gauge trocar needle was advanced to the margin of the lesion. Once needle tip position was confirmed, coaxial 18-gauge core biopsy samples were obtained, submitted in saline to surgical pathology. The guide needle was removed. . Postprocedure scans show no hemorrhage or other apparent complication. COMPLICATIONS: None immediate FINDINGS: Clear colorless CSF returned on lumbar puncture, 6 mL collected for the requested laboratory studies. The presacral mass was localized and representative 18 gauge core biopsy samples obtained as above, submitted in saline to surgical pathology. IMPRESSION: 1. Technically successful lumbar puncture under CT. 2. Technically successful presacral mass core biopsy under CT guidance. RADIATION DOSE REDUCTION: This exam was performed according to the departmental dose-optimization program which includes automated exposure control, adjustment of the mA and/or kV according to patient size and/or use of iterative reconstruction technique. Electronically Signed   By: Corlis Leak M.D.   On: 05/27/2023 16:56   MR Brain W and Wo Contrast Result Date: 05/26/2023 CLINICAL DATA:  Mental status change with unknown cause. EXAM: MRI HEAD WITHOUT AND WITH CONTRAST TECHNIQUE: Multiplanar, multiecho pulse sequences of the brain and surrounding structures were obtained without and with intravenous contrast. CONTRAST:  10mL GADAVIST GADOBUTROL 1 MMOL/ML IV SOLN COMPARISON:  Head CT from yesterday FINDINGS: Brain: Wedge of weakly restricted diffusion in the inferior left cerebellum  with homogeneous enhancement, area measuring up to 13 mm. There have been numerous prior small vessel infarcts in the bilateral cerebellum. There is also subtle leptomeningeal enhancement especially in the inferior left cerebellum, along superior cerebellar folia, and likely along the inferior temporal lobes. Indistinct enhancement at least in the left internal auditory canal. Chronic small vessel ischemic gliosis in the cerebral white matter with chronic perforator infarct at the left external capsule and corona radiata. Ventriculomegaly primarily attributed to brain atrophy. Periventricular FLAIR hyperintensity likely from chronic small vessel ischemia. No morphologic changes typical of communicating hydrocephalus. Dural based mass along the left cerebral convexity with dural tail, mass measuring 6.5 cm in length by 1.8 cm in thickness. The mass has gradually enlarged since a 2019 brain MRI, consistent with meningioma rather than CNS lymphoma. There is local mass effect without brain edema. Vascular: Major flow voids are preserved Skull and upper cervical spine: No focal marrow lesion. Sinuses/Orbits: Patchy opacification of the paranasal sinuses with multiple areas of polypoid formation, including a nonenhancing polyp projecting into the nasopharynx. IMPRESSION: 1. Abnormal leptomeningeal enhancement which could be infectious or lymphomatous meningitis. 2. 13 mm area of enhancement in the inferior left cerebellum has signal characteristics seen in both subacute infarct and CNS lymphoma. Consider CSF analysis.  3. Large meningioma along the left frontal convexity, slowly growing since at least 2019. Electronically Signed   By: Tiburcio Pea M.D.   On: 05/26/2023 11:09   CT ABDOMEN PELVIS WO CONTRAST Result Date: 05/25/2023 CLINICAL DATA:  Left lower quadrant abdominal pain EXAM: CT ABDOMEN AND PELVIS WITHOUT CONTRAST TECHNIQUE: Multidetector CT imaging of the abdomen and pelvis was performed following the  standard protocol without IV contrast. RADIATION DOSE REDUCTION: This exam was performed according to the departmental dose-optimization program which includes automated exposure control, adjustment of the mA and/or kV according to patient size and/or use of iterative reconstruction technique. COMPARISON:  04/28/2023 FINDINGS: Lower chest: Appear bilateral lower lobe pulmonary nodules are stable since remote prior examination 02/13/2022 and are safely considered benign. No acute abnormality. Extensive multi-vessel coronary artery calcification. Hepatobiliary: No focal liver abnormality is seen. No gallstones, gallbladder wall thickening, or biliary dilatation. Pancreas: Unremarkable Spleen: Unremarkable Adrenals/Urinary Tract: Adrenal glands are unremarkable. There is extensive bilateral perinephric and periureteric inflammatory stranding within the retroperitoneum which appears progressive since prior examination and extends into the pelvis where there is severe perivesicular inflammatory stranding and progressive bladder wall thickening. The findings can be seen in the setting aggressive infection. In the appropriate clinical setting, radiation therapy could also result in a similar appearance. No hydronephrosis. No intrarenal or ureteral calculi. The bladder is not distended. Stomach/Bowel: Moderate colonic stool burden. There is progressive pericolonic inflammatory stranding involving the distal rectosigmoid colon and rectum with asymmetric inflammatory changes within the left retroperitoneum along the left pelvic sidewall. There is, additionally, progressive soft tissue density within the presacral space measuring 3.1 x 3.1 x 5.7 cm at axial image # 74/2 and sagittal image # 71/6 which is not well characterized on this noncontrast examination but may represent developing phlegmon or an infiltrative mass. There is extensive circumferential rectal wall thickening involving the terminal rectum. The stomach, small  bowel, and large bowel are otherwise unremarkable. Appendix. Vascular/Lymphatic: Aortic atherosclerosis. No enlarged abdominal or pelvic lymph nodes. Reproductive: There is inflammatory stranding involving the root of the penis, again possibly related to changes local infection, as can be seen with cellulitis, or radiation therapy. The prostate gland is of normal size but difficult to discretely discern adjacent inflammatory change. Other: Stable dermal thickening within the anterior abdominal wall nonspecific. Postsurgical changes noted within the right inguinal region. Musculoskeletal: No acute bone abnormality. No lytic or blastic bone lesion. IMPRESSION: 1. Progressive pericolonic inflammatory stranding involving the distal rectosigmoid colon and rectum with asymmetric inflammatory changes within the left retroperitoneum along the left pelvic sidewall. There is, additionally, progressive soft tissue density within the presacral space measuring 3.1 x 3.1 x 5.7 cm which is not well characterized on this noncontrast examination but may represent developing phlegmon or an infiltrative mass. Dedicated contrast enhanced CT or MRI examination is recommended for further evaluation. 2. Progressive bilateral perinephric and periureteric inflammatory stranding within the retroperitoneum which extends into the pelvis where there is severe perivesicular inflammatory stranding and progressive bladder wall thickening. The findings can be seen in the setting of aggressive infection and correlation with urinalysis and urine culture is recommended. In the appropriate clinical setting, radiation therapy could also result in a similar appearance. 3. Inflammatory stranding involving the root of the penis, again possibly related to changes local infection, as can be seen with cellulitis, or radiation therapy. 4. Extensive multi-vessel coronary artery calcification. Aortic Atherosclerosis (ICD10-I70.0). Electronically Signed   By:  Helyn Numbers M.D.   On: 05/25/2023 23:35  CT Head Wo Contrast Result Date: 05/25/2023 CLINICAL DATA:  Mental status change EXAM: CT HEAD WITHOUT CONTRAST TECHNIQUE: Contiguous axial images were obtained from the base of the skull through the vertex without intravenous contrast. RADIATION DOSE REDUCTION: This exam was performed according to the departmental dose-optimization program which includes automated exposure control, adjustment of the mA and/or kV according to patient size and/or use of iterative reconstruction technique. COMPARISON:  CT 03/06/2018, MRI 08/02/2017 FINDINGS: Brain: No acute territorial infarction or hemorrhage is visualized. Interval mild ventricular enlargement compared to prior head CT but no significant progression of atrophy. Underlying chronic small vessel ischemic changes of the white matter but increased white matter hypodensity adjacent to the frontal and occipital horns. Small chronic appearing left capsular infarct extending into the white matter. Small chronic cerebellar infarcts. Possible small chronic right occipital infarct. Interval enlargement of left convexity slightly dense plaque like mass, measures about 6.3 cm AP x 17 cm transverse on series 2, image 13, previously 41 x 14 mm. No surrounding hypodense edema. No midline shift. Vascular: No hyperdense vessels.  Carotid vascular calcification Skull: Normal. Negative for fracture or focal lesion. Sinuses/Orbits: Mucosal thickening in the sinuses with small fluid level right sphenoid sinus Other: None IMPRESSION: 1. Negative for acute intracranial hemorrhage or midline shift. 2. Interval enlargement of left convexity slightly dense extra-axial mass, measures about 6.3 cm AP x 17 cm transverse, previously 41 x 14 mm. No surrounding hypodense edema. No midline shift. Findings previously characterized as probable meningioma 3. Interval mild ventricular enlargement compared to prior head CT but no significant progression of  atrophy. Increased white matter hypodensity adjacent to the frontal and occipital horns, difficult to exclude normal pressure hydrocephalus with transependymal flow of CSF. Suggest further assessment with MRI. Left convexity extra-axial enlarged mass could also be evaluated at MRI follow-up. 4. Small chronic infarcts as described above. 5. Mucosal thickening in the sinuses with small fluid level right sphenoid sinus, correlate for acute sinusitis. Electronically Signed   By: Jasmine Pang M.D.   On: 05/25/2023 21:46   DG Chest Port 1 View Result Date: 05/25/2023 CLINICAL DATA:  Weakness. EXAM: PORTABLE CHEST 1 VIEW COMPARISON:  November 16, 2022. FINDINGS: The heart size and mediastinal contours are within normal limits. Both lungs are clear. The visualized skeletal structures are unremarkable. IMPRESSION: No active disease. Electronically Signed   By: Lupita Raider M.D.   On: 05/25/2023 20:56    Principal Problem:   AKI (acute kidney injury) (HCC) Active Problems:   Meningioma (HCC)   Pelvic mass   Type 2 diabetes mellitus with vascular disease (HCC)   History of CVA with residual deficit   Essential hypertension   DLBCL (diffuse large B cell lymphoma) (HCC)   Chronic diastolic CHF (congestive heart failure) (HCC)     Lamyia Cdebaca Orma Flaming, [facility]  If 7PM-7AM, please contact night-coverage [email or web address]   LOS: 0 days

## 2023-05-27 NOTE — Sedation Documentation (Signed)
Lumbar puncture done

## 2023-05-27 NOTE — Procedures (Signed)
  Procedure:  1. LP under CT 2. CT guided core biopsy presacral mass 18g x3 in saline Preprocedure diagnosis: The primary encounter diagnosis was Altered mental status, unspecified altered mental status type. Diagnoses of Acute kidney injury (HCC) and Dehydration were also pertinent to this visit. Postprocedure diagnosis: same EBL:    minimal Complications:   none immediate  See full dictation in [employer].  Thora Lance MD Main # [phone] Pager  [phone] Mobile [phone]

## 2023-05-27 NOTE — H&P (Signed)
 Chief Complaint: Presacral mass, request for image guided pelvic mass biopsy; altered mental status, request for image guided lumbar puncture Referring Provider(s): Dr. Carollee Herter  Supervising Physician: Oley Balm  Patient Status: [facility] - In-pt  History of Present Illness: Alex Cortez is a 73 y.o. male with a past medical history of CAD, DM type II, dyslipidemia, HTN, CVA (baseline right-sided deficit), meningioma, MI in 2011, diffuse large B-cell lymphoma (has been in remission since last year).  He is known to IR for right Port-A-Cath placement and subsequent removal with completion of chemo in 2024.  He presented to the ER on 05/25/2023 with weakness, confusion, oliguria, and abd pain; BP 75/57 on arrival to ER. CT scan on 3/12 revealed "soft tissue density within the presacral space measuring 3.1 x 3.1 x 5.7 cm which is not well characterized on this noncontrast examination but may represent developing phlegmon or an infiltrative mass". 3/13 MRI: "Abnormal leptomeningeal enhancement which could be infectious or lymphomatous meningitis." BP has since improved; he is afebrile. IR consulted for image guided LP for cytology and to r/o meningitis as well as image guided biopsy of presacral mass.   Patient is Full Code  Past Medical History:  Diagnosis Date   CAD (coronary artery disease)    nonobstructive CAD/patent LAD stent site; EF 65%, 08/2011; NSTEMI/DES LAD, 6/20 11   Cerebral embolism with cerebral infarction 08/01/2017   Chest pain    CVA (cerebral vascular accident) (HCC) 08/01/2017   Diabetes mellitus    insulin-dependent   Dyslipidemia    HTN (hypertension)    Hypothyroidism    Left pontine stroke (HCC) 10/23/2012   Myocardial infarction (HCC) 2011   Post MI syndrome (HCC) 08/29/2009   Stroke (HCC) 10/22/2012   Left inferior pons    Past Surgical History:  Procedure Laterality Date   COLONOSCOPY  08/2021   CORONARY ANGIOPLASTY WITH STENT PLACEMENT  2011    IR IMAGING GUIDED PORT INSERTION  04/26/2022   IR REMOVAL TUN ACCESS W/ PORT W/O FL MOD SED  01/20/2023   LYMPH NODE BIOPSY Right 04/14/2022   Procedure: RIGHT INGUINAL EXCISIONAL LYMPH NODE BIOPSY;  Surgeon: Fritzi Mandes, MD;  Location: [facility] ORS;  Service: General;  Laterality: Right;    Allergies: Ace inhibitors, Other, Rituxan [rituximab], and Shrimp [shellfish allergy]  Medications: Prior to Admission medications   Medication Sig Start Date End Date Taking? Authorizing Provider  allopurinol (ZYLOPRIM) 300 MG tablet TAKE 1 TABLET BY MOUTH EVERY DAY 08/30/22   Namon Cirri E, PA-C  carvedilol (COREG) 12.5 MG tablet Take 1 tablet (12.5 mg total) by mouth 2 (two) times daily with a meal. 11/18/22   Rolly Salter, MD  clopidogrel (PLAVIX) 75 MG tablet Take 1 tablet (75 mg total) by mouth daily. 11/28/17   Ihor Austin, NP  Continuous Glucose Receiver (FREESTYLE LIBRE 3 READER) DEVI 1 Piece by Does not apply route once as needed for up to 1 dose. 12/13/22   Roma Kayser, MD  Continuous Glucose Sensor (FREESTYLE LIBRE 3 PLUS SENSOR) MISC Change sensor every 15 days. E11.65 04/19/23   Roma Kayser, MD  dapagliflozin propanediol (FARXIGA) 10 MG TABS tablet Take 1 tablet (10 mg total) by mouth daily with breakfast. 01/25/23   Nida, Denman George, MD  dapagliflozin propanediol (FARXIGA) 10 MG TABS tablet Take 1 tablet (10 mg total) by mouth daily before breakfast. 04/08/23   Nida, Denman George, MD  doxycycline (VIBRAMYCIN) 100 MG capsule Take 1 capsule (100  mg total) by mouth every 12 (twelve) hours for 10 days. 05/18/23 05/28/23  Donnita Falls, FNP  ezetimibe (ZETIA) 10 MG tablet Take 1 tablet (10 mg total) by mouth daily. 12/27/22   Antoine Poche, MD  insulin regular human CONCENTRATED (HUMULIN R U-500 KWIKPEN) 500 UNIT/ML KwikPen INJECT AS DIRECTED 60 UNITS WITH BREAKFAST, 60 UNITS WITH LUNCH, AND 60 UNITS WITH SUPPER FOR PRE-MEAL BLOOD GLUCOSE READINGS OF 90MG /DL OR  ABOVE 03/20/08   Roma Kayser, MD  isosorbide mononitrate (IMDUR) 30 MG 24 hr tablet TAKE 1 TABLET BY MOUTH EVERY DAY 09/13/22   Antoine Poche, MD  levothyroxine (SYNTHROID) 75 MCG tablet TAKE 1 TABLET BY MOUTH EVERY DAY BEFORE BREAKFAST 04/08/23   Roma Kayser, MD  lidocaine-prilocaine (EMLA) cream Apply 1 Application topically as needed. Patient taking differently: Apply 1 Application topically daily as needed (port access). 04/29/22   Jaci Standard, MD  metFORMIN (GLUCOPHAGE) 500 MG tablet TAKE 1 TABLET BY MOUTH EVERY DAY WITH BREAKFAST 04/08/23   Roma Kayser, MD  nitroGLYCERIN (NITROSTAT) 0.4 MG SL tablet Place 1 tablet (0.4 mg total) under the tongue every 5 (five) minutes as needed. 08/30/11   de Melanie Crazier, MD  ONE TOUCH ULTRA TEST test strip USE TO TEST BLOOD SUGAR FOUR TIMES DAILY E11.65 12/16/15   Roma Kayser, MD  rosuvastatin (CRESTOR) 40 MG tablet TAKE 1 TABLET BY MOUTH EVERY DAY 04/08/23   Antoine Poche, MD  tamsulosin (FLOMAX) 0.4 MG CAPS capsule Take 0.4 mg by mouth daily. 02/01/23   [provider]     Family History  Problem Relation Age of Onset   Colon cancer Mother    Hypertension Other        in all family members    Social History   Socioeconomic History   Marital status: Married    Spouse name: Not on file   Number of children: Not on file   Years of education: Not on file   Highest education level: Not on file  Occupational History   Not on file  Tobacco Use   Smoking status: Former    Current packs/day: 0.00    Average packs/day: 0.5 packs/day for 33.0 years (16.5 ttl pk-yrs)    Types: Cigarettes    Start date: 03/16/1955    Quit date: 03/15/1988    Years since quitting: 35.2   Smokeless tobacco: Never  Vaping Use   Vaping status: Never Used  Substance and Sexual Activity   Alcohol use: No    Alcohol/week: 0.0 standard drinks of alcohol   Drug use: No   Sexual activity: Not on file  Other Topics  Concern   Not on file  Social History Narrative   Works at [employer]. Married.    Social Drivers of Corporate investment banker Strain: Low Risk  (01/03/2023)   Overall Financial Resource Strain (CARDIA)    Difficulty of Paying Living Expenses: Not very hard  Food Insecurity: Patient Unable To Answer (05/26/2023)   Hunger Vital Sign    Worried About Running Out of Food in the Last Year: Patient unable to answer    Ran Out of Food in the Last Year: Patient unable to answer  Transportation Needs: Patient Unable To Answer (05/26/2023)   PRAPARE - Transportation    Lack of Transportation (Medical): Patient unable to answer    Lack of Transportation (Non-Medical): Patient unable to answer  Physical Activity: Inactive (11/22/2022)   Exercise  Vital Sign    Days of Exercise per Week: 0 days    Minutes of Exercise per Session: 0 min  Stress: No Stress Concern Present (04/07/2022)   [employer] of Occupational Health - Occupational Stress Questionnaire    Feeling of Stress : Not at all  Social Connections: Unknown (05/26/2023)   Social Connection and Isolation Panel [NHANES]    Frequency of Communication with Friends and Family: Patient unable to answer    Frequency of Social Gatherings with Friends and Family: Patient unable to answer    Attends Religious Services: Patient unable to answer    Active Member of Clubs or Organizations: Not on file    Attends Banker Meetings: Patient unable to answer    Marital Status: Patient unable to answer     Review of Systems: A 12 point ROS discussed and pertinent positives are indicated in the HPI above.  All other systems are negative.  Review of Systems  Constitutional:  Negative for chills and fever.  Respiratory:  Negative for chest tightness and shortness of breath.   Cardiovascular:  Negative for chest pain.  Gastrointestinal:  Positive for abdominal pain.       Points to epigastric region, later in exam denies pain with  palpation. ROS limited by pt confusion.   Neurological:  Positive for weakness.  Hematological:  Does not bruise/bleed easily.  Psychiatric/Behavioral:  Positive for confusion.      Vital Signs: BP (!) 146/73 (BP Location: Right Arm)   Pulse 75   Temp 98.3 F (36.8 C)   Resp 19   Ht 6\' 3"  (1.905 m)   Wt 227 lb 4.7 oz (103.1 kg)   SpO2 100%   BMI 28.41 kg/m   Advance Care Plan: No documents on file   Physical Exam Constitutional:      Appearance: He is obese.  HENT:     Mouth/Throat:     Mouth: Mucous membranes are dry.     Pharynx: Oropharynx is clear.  Cardiovascular:     Rate and Rhythm: Normal rate and regular rhythm.     Pulses: Normal pulses.     Heart sounds: Normal heart sounds.  Pulmonary:     Effort: Pulmonary effort is normal.     Breath sounds: Normal breath sounds.  Abdominal:     General: There is no distension.     Palpations: Abdomen is soft.     Tenderness: There is no abdominal tenderness.  Genitourinary:    Comments: Scrotal swelling noted Skin:    General: Skin is warm and dry.     Comments: Chronic appearing skin darkening to BLE, R>L  Neurological:     Mental Status: He is disoriented.     Motor: Weakness present.      Imaging: MR Brain W and Wo Contrast Result Date: 05/26/2023 CLINICAL DATA:  Mental status change with unknown cause. EXAM: MRI HEAD WITHOUT AND WITH CONTRAST TECHNIQUE: Multiplanar, multiecho pulse sequences of the brain and surrounding structures were obtained without and with intravenous contrast. CONTRAST:  10mL GADAVIST GADOBUTROL 1 MMOL/ML IV SOLN COMPARISON:  Head CT from yesterday FINDINGS: Brain: Wedge of weakly restricted diffusion in the inferior left cerebellum with homogeneous enhancement, area measuring up to 13 mm. There have been numerous prior small vessel infarcts in the bilateral cerebellum. There is also subtle leptomeningeal enhancement especially in the inferior left cerebellum, along superior cerebellar  folia, and likely along the inferior temporal lobes. Indistinct enhancement at least in the left  internal auditory canal. Chronic small vessel ischemic gliosis in the cerebral white matter with chronic perforator infarct at the left external capsule and corona radiata. Ventriculomegaly primarily attributed to brain atrophy. Periventricular FLAIR hyperintensity likely from chronic small vessel ischemia. No morphologic changes typical of communicating hydrocephalus. Dural based mass along the left cerebral convexity with dural tail, mass measuring 6.5 cm in length by 1.8 cm in thickness. The mass has gradually enlarged since a 2019 brain MRI, consistent with meningioma rather than CNS lymphoma. There is local mass effect without brain edema. Vascular: Major flow voids are preserved Skull and upper cervical spine: No focal marrow lesion. Sinuses/Orbits: Patchy opacification of the paranasal sinuses with multiple areas of polypoid formation, including a nonenhancing polyp projecting into the nasopharynx. IMPRESSION: 1. Abnormal leptomeningeal enhancement which could be infectious or lymphomatous meningitis. 2. 13 mm area of enhancement in the inferior left cerebellum has signal characteristics seen in both subacute infarct and CNS lymphoma. Consider CSF analysis. 3. Large meningioma along the left frontal convexity, slowly growing since at least 2019. Electronically Signed   By: Tiburcio Pea M.D.   On: 05/26/2023 11:09   CT ABDOMEN PELVIS WO CONTRAST Result Date: 05/25/2023 CLINICAL DATA:  Left lower quadrant abdominal pain EXAM: CT ABDOMEN AND PELVIS WITHOUT CONTRAST TECHNIQUE: Multidetector CT imaging of the abdomen and pelvis was performed following the standard protocol without IV contrast. RADIATION DOSE REDUCTION: This exam was performed according to the departmental dose-optimization program which includes automated exposure control, adjustment of the mA and/or kV according to patient size and/or use of  iterative reconstruction technique. COMPARISON:  04/28/2023 FINDINGS: Lower chest: Appear bilateral lower lobe pulmonary nodules are stable since remote prior examination 02/13/2022 and are safely considered benign. No acute abnormality. Extensive multi-vessel coronary artery calcification. Hepatobiliary: No focal liver abnormality is seen. No gallstones, gallbladder wall thickening, or biliary dilatation. Pancreas: Unremarkable Spleen: Unremarkable Adrenals/Urinary Tract: Adrenal glands are unremarkable. There is extensive bilateral perinephric and periureteric inflammatory stranding within the retroperitoneum which appears progressive since prior examination and extends into the pelvis where there is severe perivesicular inflammatory stranding and progressive bladder wall thickening. The findings can be seen in the setting aggressive infection. In the appropriate clinical setting, radiation therapy could also result in a similar appearance. No hydronephrosis. No intrarenal or ureteral calculi. The bladder is not distended. Stomach/Bowel: Moderate colonic stool burden. There is progressive pericolonic inflammatory stranding involving the distal rectosigmoid colon and rectum with asymmetric inflammatory changes within the left retroperitoneum along the left pelvic sidewall. There is, additionally, progressive soft tissue density within the presacral space measuring 3.1 x 3.1 x 5.7 cm at axial image # 74/2 and sagittal image # 71/6 which is not well characterized on this noncontrast examination but may represent developing phlegmon or an infiltrative mass. There is extensive circumferential rectal wall thickening involving the terminal rectum. The stomach, small bowel, and large bowel are otherwise unremarkable. Appendix. Vascular/Lymphatic: Aortic atherosclerosis. No enlarged abdominal or pelvic lymph nodes. Reproductive: There is inflammatory stranding involving the root of the penis, again possibly related to  changes local infection, as can be seen with cellulitis, or radiation therapy. The prostate gland is of normal size but difficult to discretely discern adjacent inflammatory change. Other: Stable dermal thickening within the anterior abdominal wall nonspecific. Postsurgical changes noted within the right inguinal region. Musculoskeletal: No acute bone abnormality. No lytic or blastic bone lesion. IMPRESSION: 1. Progressive pericolonic inflammatory stranding involving the distal rectosigmoid colon and rectum with asymmetric inflammatory changes within  the left retroperitoneum along the left pelvic sidewall. There is, additionally, progressive soft tissue density within the presacral space measuring 3.1 x 3.1 x 5.7 cm which is not well characterized on this noncontrast examination but may represent developing phlegmon or an infiltrative mass. Dedicated contrast enhanced CT or MRI examination is recommended for further evaluation. 2. Progressive bilateral perinephric and periureteric inflammatory stranding within the retroperitoneum which extends into the pelvis where there is severe perivesicular inflammatory stranding and progressive bladder wall thickening. The findings can be seen in the setting of aggressive infection and correlation with urinalysis and urine culture is recommended. In the appropriate clinical setting, radiation therapy could also result in a similar appearance. 3. Inflammatory stranding involving the root of the penis, again possibly related to changes local infection, as can be seen with cellulitis, or radiation therapy. 4. Extensive multi-vessel coronary artery calcification. Aortic Atherosclerosis (ICD10-I70.0). Electronically Signed   By: Helyn Numbers M.D.   On: 05/25/2023 23:35   CT Head Wo Contrast Result Date: 05/25/2023 CLINICAL DATA:  Mental status change EXAM: CT HEAD WITHOUT CONTRAST TECHNIQUE: Contiguous axial images were obtained from the base of the skull through the vertex  without intravenous contrast. RADIATION DOSE REDUCTION: This exam was performed according to the departmental dose-optimization program which includes automated exposure control, adjustment of the mA and/or kV according to patient size and/or use of iterative reconstruction technique. COMPARISON:  CT 03/06/2018, MRI 08/02/2017 FINDINGS: Brain: No acute territorial infarction or hemorrhage is visualized. Interval mild ventricular enlargement compared to prior head CT but no significant progression of atrophy. Underlying chronic small vessel ischemic changes of the white matter but increased white matter hypodensity adjacent to the frontal and occipital horns. Small chronic appearing left capsular infarct extending into the white matter. Small chronic cerebellar infarcts. Possible small chronic right occipital infarct. Interval enlargement of left convexity slightly dense plaque like mass, measures about 6.3 cm AP x 17 cm transverse on series 2, image 13, previously 41 x 14 mm. No surrounding hypodense edema. No midline shift. Vascular: No hyperdense vessels.  Carotid vascular calcification Skull: Normal. Negative for fracture or focal lesion. Sinuses/Orbits: Mucosal thickening in the sinuses with small fluid level right sphenoid sinus Other: None IMPRESSION: 1. Negative for acute intracranial hemorrhage or midline shift. 2. Interval enlargement of left convexity slightly dense extra-axial mass, measures about 6.3 cm AP x 17 cm transverse, previously 41 x 14 mm. No surrounding hypodense edema. No midline shift. Findings previously characterized as probable meningioma 3. Interval mild ventricular enlargement compared to prior head CT but no significant progression of atrophy. Increased white matter hypodensity adjacent to the frontal and occipital horns, difficult to exclude normal pressure hydrocephalus with transependymal flow of CSF. Suggest further assessment with MRI. Left convexity extra-axial enlarged mass could  also be evaluated at MRI follow-up. 4. Small chronic infarcts as described above. 5. Mucosal thickening in the sinuses with small fluid level right sphenoid sinus, correlate for acute sinusitis. Electronically Signed   By: Jasmine Pang M.D.   On: 05/25/2023 21:46   DG Chest Port 1 View Result Date: 05/25/2023 CLINICAL DATA:  Weakness. EXAM: PORTABLE CHEST 1 VIEW COMPARISON:  November 16, 2022. FINDINGS: The heart size and mediastinal contours are within normal limits. Both lungs are clear. The visualized skeletal structures are unremarkable. IMPRESSION: No active disease. Electronically Signed   By: Lupita Raider M.D.   On: 05/25/2023 20:56   US SCROTUM W/DOPPLER Result Date: 05/06/2023 CLINICAL DATA:  Four day history of  swelling and difficulty urinating EXAM: SCROTAL ULTRASOUND DOPPLER ULTRASOUND OF THE TESTICLES TECHNIQUE: Complete ultrasound examination of the testicles, epididymis, and other scrotal structures was performed. Color and spectral Doppler ultrasound were also utilized to evaluate blood flow to the testicles. COMPARISON:  None Available. FINDINGS: Right testicle Measurements: 4.3 x 2.9 x 2.5 cm, 22.1 mL. No mass or microlithiasis visualized. Left testicle Measurements: 4.4 x 2.7 x 2.6 cm, 21.9 mL. No mass or microlithiasis visualized. Right epididymis:  Normal in size and appearance. Left epididymis:  Heterogeneous and enlarged. Hydrocele:  Small bilateral hydroceles. Varicocele:  None visualized. Pulsed Doppler interrogation of both testes demonstrates normal low resistance arterial and venous waveforms bilaterally. Diffuse scrotal soft tissue swelling, right-greater-than-left. IMPRESSION: 1. No evidence of testicular torsion. 2. Heterogeneous and enlarged left epididymis, which may be seen in the setting of epididymitis. 3. Diffuse scrotal soft tissue swelling, right-greater-than-left, which may be reactive or related to cellulitis. 4. Small bilateral hydroceles. Electronically Signed    By: Agustin Cree M.D.   On: 05/06/2023 18:43   CT CHEST ABDOMEN PELVIS W CONTRAST Result Date: 04/30/2023 CLINICAL DATA:  History of diffuse large B-cell lymphoma, chemotherapy complete. Follow-up. * Tracking Code: BO * EXAM: CT CHEST, ABDOMEN, AND PELVIS WITH CONTRAST TECHNIQUE: Multidetector CT imaging of the chest, abdomen and pelvis was performed following the standard protocol during bolus administration of intravenous contrast. RADIATION DOSE REDUCTION: This exam was performed according to the departmental dose-optimization program which includes automated exposure control, adjustment of the mA and/or kV according to patient size and/or use of iterative reconstruction technique. CONTRAST:  OMNIPAQUE IOHEXOL 300 MG/ML  SOLN COMPARISON:  Multiple priors including PET-CT October 25, 2022, chest CT November 16, 2022 and CT abdomen pelvis February 03, 2023 and chest CT December 15, 2022 FINDINGS: CT CHEST FINDINGS Cardiovascular: Interval removal of the right chest Port-A-Cath with a nodular focus of soft tissue in the anterior chest wall corresponding with the location on image 16/2. Aortic atherosclerosis. No central pulmonary embolus on this nondedicated study. Three-vessel coronary artery calcifications. Normal size heart. No significant pericardial effusion. Mediastinum/Nodes: No suspicious thyroid nodule. No pathologically enlarged mediastinal, hilar or axillary lymph nodes. The esophagus is grossly unremarkable. Lungs/Pleura: No new suspicious pulmonary nodules or masses. Noncalcified 6 mm left lower lobe pulmonary nodule on image 108/4 and 8 mm noncalcified right lower lobe pulmonary nodule on image 105/4 are stable from prior. Musculoskeletal: No aggressive lytic or blastic lesion of bone. CT ABDOMEN PELVIS FINDINGS Hepatobiliary: No suspicious hepatic lesion. Gallbladder is unremarkable. No biliary ductal dilation. Pancreas: No pancreatic ductal dilation or evidence of acute inflammation. Spleen: No  splenomegaly. Adrenals/Urinary Tract: Bilateral adrenal glands appear normal. No hydronephrosis. Kidneys demonstrate symmetric enhancement. New perinephric/periureteric soft tissue stranding extending into the pelvis. Perivesicular fluid/stranding. Stomach/Bowel: Radiopaque enteric contrast material traverses the cecum. Stomach is unremarkable for minimal distension. No pathologic dilation of small or large bowel. New symmetric rectal wall thickening on image 117/2 with mesorectal stranding. Vascular/Lymphatic: Aortic atherosclerosis. Normal caliber abdominal aorta. Stable prominent retroperitoneal lymph nodes not pathologically enlarged by size criteria. No pathologically enlarged abdominal or pelvic lymph nodes. Reproductive: Prostate is unremarkable. Other: New mesenteric and retroperitoneal soft tissue stranding/fluid extending into the pelvis. No walled-off fluid collection. Similar cutaneous thickening in the anterior abdominal wall. Musculoskeletal: Multilevel degenerative changes spine. No aggressive lytic or blastic lesion of bone. Degenerative change of the bilateral hips. IMPRESSION: 1. No pathologically enlarged lymph nodes above or below the diaphragm. 2. New mesenteric and retroperitoneal stranding extending  into the pelvis without discrete peritoneal or omental nodularity, possibly reflecting lymphatic flow impedance or the beginning of a retroperitoneal fibrotic process. Suggest attention on follow-up imaging. 3. New perinephric/periureteric soft tissue stranding extending into the pelvis with perivesicular fluid/stranding. Findings are nonspecific favored part of the above process but but can be seen in the setting of ascending urinary tract infection. Correlate with urinalysis. 4. New symmetric rectal wall thickening with mesorectal stranding, possibly part of the process from impression #2 but nonspecific and can be seen in the setting of proctitis suggest clinical correlation. 5. Stable  noncalcified pulmonary nodules measuring up to 8 mm. 6.  Aortic Atherosclerosis (ICD10-I70.0). Electronically Signed   By: Maudry Mayhew M.D.   On: 04/30/2023 10:14    Labs:  CBC: Recent Labs    04/15/23 1320 05/06/23 2141 05/25/23 1945 05/26/23 0414  WBC 6.8 5.4 6.1 5.5  HGB 12.6* 11.8* 13.6 12.3*  HCT 39.1 38.5* 43.2 38.9*  PLT 228 223 244 189    COAGS: Recent Labs    07/11/22 0204 11/16/22 2035 11/17/22 0427 05/25/23 1945  INR 1.2 1.1 1.3* 1.1  APTT  --  24  --  <22*    BMP: Recent Labs    04/15/23 1320 05/06/23 2141 05/25/23 1945 05/26/23 0414  NA 140 142 137 141  K 4.3 4.2 5.4* 4.5  CL 109 106 104 110  CO2 24 23 20* 20*  GLUCOSE 193* 139* 212* 139*  BUN 19 18 34* 35*  CALCIUM 9.4 9.1 9.7 9.0  CREATININE 1.05 1.11 1.79* 1.34*  GFRNONAA >60 >60 40* 56*    LIVER FUNCTION TESTS: Recent Labs    01/06/23 1427 04/15/23 1320 05/06/23 2141 05/25/23 1945  BILITOT 0.6 0.6 1.0 1.4*  AST 17 22 32 20  ALT 26 30 34 28  ALKPHOS 56 56 46 61  PROT 7.6 7.5 7.4 8.1  ALBUMIN 4.2 4.0 3.6 3.9    TUMOR MARKERS: No results for input(s): "AFPTM", "CEA", "CA199", "CHROMGRNA" in the last 8760 hours.  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 on 3/12 CT - positive scrotal swelling, endorses epigastric abd pain -05/25/23 CT- soft tissue density within the presacral space measuring 3.1 x 3.1 x 5.7 cm which is not well characterized on this noncontrast examination but may represent developing phlegmon or an infiltrative mass -WBC, hemoglobin, platelets WNL -Afebrile  Request for Image guided lumbar puncture to evaluated new onset confusion -Altered mental status and weakness (A&Ox4 at baseline, no assistive device), image guided lumbar puncture ordered to further evaluate patient for concerns for meningitis and cytology given patient oncologic history. -3/13 MRI: "Abnormal leptomeningeal enhancement which could be infectious  or lymphomatous meningitis."  - Patient has been overall pleasantly confused, no problems anticipated with positioning patient for LP d/t confusion    Patient has been n.p.o. since midnight; spoke to wife at bedside who consents for patient for both of the aforementioned procedures given patient's altered mental status.  Patient takes Plavix; ok to proceed with low risk procedures.   Risks and benefits of image guided biopsy was discussed with the patient and/or patient's family including, but not limited to bleeding, infection, damage to adjacent structures or low yield requiring additional tests.  All of the questions were answered and there is agreement to proceed.  Consent signed and in chart.   Risks and benefits of lumbar puncture were discussed with the patient including bleeding, infection, damage to adjacent structures, leakage of spinal fluid  and headache requiring blood patch.  All questions were answered, patient is agreeable to proceed. Consent signed and in chart.   Thank you for allowing our service to participate in Alex Cortez 's care.    Electronically Signed: Carlton Adam, NP   05/27/2023, 8:04 AM     I spent a total of 20 Minutes    in face to face in clinical consultation, greater than 50% of which was counseling/coordinating care for request for image guided biopsy for presacral mass and image guided diagnostic lumbar puncture for new onset confusion   (A copy of this note was sent to the referring provider and the time of visit.)

## 2023-05-27 NOTE — Plan of Care (Signed)
  Problem: Education: Goal: Knowledge of General Education information will improve Description: Including pain rating scale, medication(s)/side effects and non-pharmacologic comfort measures Outcome: Progressing   Problem: Health Behavior/Discharge Planning: Goal: Ability to manage health-related needs will improve Outcome: Progressing   Problem: Clinical Measurements: Goal: Ability to maintain clinical measurements within normal limits will improve Outcome: Progressing Goal: Will remain free from infection Outcome: Progressing Goal: Diagnostic test results will improve Outcome: Progressing Goal: Respiratory complications will improve Outcome: Progressing Goal: Cardiovascular complication will be avoided Outcome: Progressing   Problem: Activity: Goal: Risk for activity intolerance will decrease Outcome: Progressing   Problem: Nutrition: Goal: Adequate nutrition will be maintained Outcome: Progressing   Problem: Coping: Goal: Level of anxiety will decrease Outcome: Progressing   Problem: Elimination: Goal: Will not experience complications related to bowel motility Outcome: Progressing Goal: Will not experience complications related to urinary retention Outcome: Adequate for Discharge   Problem: Pain Managment: Goal: General experience of comfort will improve and/or be controlled Outcome: Progressing   Problem: Safety: Goal: Ability to remain free from injury will improve Outcome: Progressing   Problem: Skin Integrity: Goal: Risk for impaired skin integrity will decrease Outcome: Progressing

## 2023-05-27 NOTE — Progress Notes (Addendum)
 Patient became agitated while trying to get out of the chair and refusing assistance from staff. Explained that staff was here to help and to ensure his safety. Multiple staff members and patient's wife were unable to redirect patient. MD paged for PRN. Attempted to give haldol PO but patient refused to take any pills at this time. Explained to patient we would need to administer Haldol IM, and patient was agreeable with security's presence.   Patient left in the recliner with chair alarm in place and call bell within reach.

## 2023-05-27 NOTE — Progress Notes (Signed)
 MEDICATION-RELATED CONSULT NOTE   IR Procedure Consult - Anticoagulant/Antiplatelet PTA/Inpatient Med List Review by Pharmacist    Procedure: LP, presacral mass Bx    Completed: 15:21  Post-Procedural bleeding risk per IR MD assessment: HIGH  Antithrombotic medications on inpatient profile prior to procedure:  Plavix 75 mg daily (last dose PTA); enoxaparin 40 mg daily (never started inpatient) PTA antithrombotic medications:  Plavix (see above)    Recommended restart time per IR Post-Procedure Guidelines:  Resume LMWH and Plavix on POD1 AM   Other considerations:  Hgb borderline low; Plt WNL   Plan:     Resume Lovenox 40 mg SQ daily and Plavix 75 mg PO daily starting tomorrow AM  Bernadene Person, PharmD, BCPS [phone] 05/27/2023, 3:43 PM

## 2023-05-27 NOTE — Progress Notes (Signed)
 Chair alarm activated, patient attempting to get out of the recliner. He cannot say where he was trying to go. Reminded patient that we do not want him to get up without staff. Patient became agitated, threatening to fight staff.

## 2023-05-28 ENCOUNTER — Inpatient Hospital Stay (HOSPITAL_COMMUNITY)

## 2023-05-28 DIAGNOSIS — N179 Acute kidney failure, unspecified: Secondary | ICD-10-CM | POA: Diagnosis not present

## 2023-05-28 DIAGNOSIS — R7881 Bacteremia: Secondary | ICD-10-CM

## 2023-05-28 LAB — GLUCOSE, CAPILLARY
Glucose-Capillary: 100 mg/dL — ABNORMAL HIGH (ref 70–99)
Glucose-Capillary: 107 mg/dL — ABNORMAL HIGH (ref 70–99)
Glucose-Capillary: 119 mg/dL — ABNORMAL HIGH (ref 70–99)
Glucose-Capillary: 154 mg/dL — ABNORMAL HIGH (ref 70–99)
Glucose-Capillary: 74 mg/dL (ref 70–99)
Glucose-Capillary: 80 mg/dL (ref 70–99)

## 2023-05-28 LAB — CBC WITH DIFFERENTIAL/PLATELET
Abs Immature Granulocytes: 0.02 10*3/uL (ref 0.00–0.07)
Basophils Absolute: 0.1 10*3/uL (ref 0.0–0.1)
Basophils Relative: 1 %
Eosinophils Absolute: 0.3 10*3/uL (ref 0.0–0.5)
Eosinophils Relative: 5 %
HCT: 39.9 % (ref 39.0–52.0)
Hemoglobin: 11.9 g/dL — ABNORMAL LOW (ref 13.0–17.0)
Immature Granulocytes: 0 %
Lymphocytes Relative: 29 %
Lymphs Abs: 1.5 10*3/uL (ref 0.7–4.0)
MCH: 25.5 pg — ABNORMAL LOW (ref 26.0–34.0)
MCHC: 29.8 g/dL — ABNORMAL LOW (ref 30.0–36.0)
MCV: 85.6 fL (ref 80.0–100.0)
Monocytes Absolute: 0.6 10*3/uL (ref 0.1–1.0)
Monocytes Relative: 11 %
Neutro Abs: 2.8 10*3/uL (ref 1.7–7.7)
Neutrophils Relative %: 54 %
Platelets: 136 10*3/uL — ABNORMAL LOW (ref 150–400)
RBC: 4.66 MIL/uL (ref 4.22–5.81)
RDW: 14.9 % (ref 11.5–15.5)
WBC: 5.3 10*3/uL (ref 4.0–10.5)
nRBC: 0 % (ref 0.0–0.2)

## 2023-05-28 LAB — MENINGITIS/ENCEPHALITIS PANEL (CSF)

## 2023-05-28 LAB — BLOOD CULTURE ID PANEL (REFLEXED) - BCID2

## 2023-05-28 LAB — ECHOCARDIOGRAM COMPLETE
AR max vel: 1.77 cm2
AV Area VTI: 1.78 cm2
AV Area mean vel: 1.47 cm2
AV Mean grad: 7 mmHg
AV Peak grad: 13.5 mmHg
Ao pk vel: 1.84 m/s
Area-P 1/2: 2.42 cm2
Calc EF: 57.1 %
Height: 75 in
MV VTI: 2.28 cm2
S' Lateral: 3.2 cm
Single Plane A2C EF: 58.8 %
Single Plane A4C EF: 59.1 %
Weight: 3636.71 [oz_av]

## 2023-05-28 LAB — BASIC METABOLIC PANEL
Anion gap: 11 (ref 5–15)
BUN: 25 mg/dL — ABNORMAL HIGH (ref 8–23)
CO2: 17 mmol/L — ABNORMAL LOW (ref 22–32)
Calcium: 8.9 mg/dL (ref 8.9–10.3)
Chloride: 112 mmol/L — ABNORMAL HIGH (ref 98–111)
Creatinine, Ser: 1 mg/dL (ref 0.61–1.24)
GFR, Estimated: >60 mL/min (ref >60)
Glucose, Bld: 86 mg/dL (ref 70–99)
Potassium: 4.2 mmol/L (ref 3.5–5.1)
Sodium: 140 mmol/L (ref 135–145)

## 2023-05-28 MED ORDER — VANCOMYCIN HCL 1250 MG/250ML IV SOLN
1250.0000 mg | Freq: Two times a day (BID) | INTRAVENOUS | Status: DC
  Administered 2023-05-28 – 2023-05-29 (×2): 1250 mg via INTRAVENOUS
  Filled 2023-05-28 (×3): qty 250

## 2023-05-28 MED ORDER — CARVEDILOL 12.5 MG PO TABS
12.5000 mg | ORAL_TABLET | Freq: Two times a day (BID) | ORAL | Status: DC
  Administered 2023-05-29 – 2023-06-05 (×16): 12.5 mg via ORAL
  Filled 2023-05-28 (×11): qty 4
  Filled 2023-05-28 (×2): qty 1
  Filled 2023-05-28 (×4): qty 4

## 2023-05-28 MED ORDER — SODIUM CHLORIDE 0.9 % IV SOLN
2.0000 g | Freq: Two times a day (BID) | INTRAVENOUS | Status: DC
  Administered 2023-05-28 – 2023-05-31 (×7): 2 g via INTRAVENOUS
  Filled 2023-05-28 (×7): qty 20

## 2023-05-28 MED ORDER — VANCOMYCIN HCL 2000 MG/400ML IV SOLN
2000.0000 mg | Freq: Once | INTRAVENOUS | Status: AC
  Administered 2023-05-28: 2000 mg via INTRAVENOUS
  Filled 2023-05-28: qty 400

## 2023-05-28 MED ORDER — SODIUM CHLORIDE 0.9 % IV SOLN
2.0000 g | INTRAVENOUS | Status: DC
  Administered 2023-05-28 – 2023-05-31 (×19): 2 g via INTRAVENOUS
  Filled 2023-05-28 (×20): qty 2000

## 2023-05-28 MED ORDER — VANCOMYCIN HCL IN DEXTROSE 1-5 GM/200ML-% IV SOLN: 1000.0000 mg | Freq: Two times a day (BID) | INTRAVENOUS | Status: DC

## 2023-05-28 NOTE — Progress Notes (Signed)
 Pharmacy Antibiotic Note  Alex Cortez is a 73 y.o. male admitted on 05/25/2023 with meningitis.  Pharmacy has been consulted for Vanco dosing.  ID: r/o meningitis (r/o carcinomatous meningitis) Afebrile, WBC WNL, Scr 1  Antimicrobials this admission: 3/15 Vancomycin >>   3/15 CTX >> 3/15 Ampicillin >>   Microbiology results: 3/12 BCx: GPC (1/4 bottles) 3/12: BCID: Staph species CSF cancelled  Dose adjustments: 3/15: Vancomycin 2gm IV x 1 followed by Vancomycin 1000 mg IV Q 12 hrs. Goal AUC 400-550.  Expected AUC: 507.3  SCr used: 1.34 - 3/15 later: Adjust to Vancomycin 1250 mg IV Q 12hrs. Goal AUC 400-550. Expected AUC: 483, SCr used: 1  Plan: Increase  Vancomycin 1250 mg IV Q 12hrs. Goal AUC 400-550. Expected AUC: 483, SCr used: 1   Height: 6\' 3"  (190.5 cm) Weight: 103.1 kg (227 lb 4.7 oz) IBW/kg (Calculated) : 84.5  Temp (24hrs), Avg:98.5 F (36.9 C), Min:98.4 F (36.9 C), Max:98.6 F (37 C)  Recent Labs  Lab 05/25/23 1945 05/25/23 1957 05/25/23 2150 05/26/23 0414 05/28/23 0517  WBC 6.1  --   --  5.5 5.3  CREATININE 1.79*  --   --  1.34* 1.00  LATICACIDVEN  --  2.2* 2.2*  --   --     Estimated Creatinine Clearance: 85.5 mL/min (by C-G formula based on SCr of 1 mg/dL).    Allergies  Allergen Reactions   Ace Inhibitors Swelling    Angioedema   Other Swelling    Shellfish   Swelling and hives  Shellfish  Swelling and hives   Rituxan [Rituximab] Other (See Comments)    Pt had sensitivity reaction to Rituxan. See progress note from 05/14/22. Pt able to complete infusion.    Shrimp [Shellfish Allergy] Hives and Swelling    Alex Cortez S. Merilynn Finland, PharmD, BCPS Clinical Staff Pharmacist  [facility]isty [facility] 05/28/2023 11:37 AM

## 2023-05-28 NOTE — Progress Notes (Signed)
 Alex Cortez

## 2023-05-28 NOTE — Significant Event (Addendum)
 Discussed with neurologist Dr. Gaye Alken about patient's lumbar puncture results which show WBC count of 595 in 1 tube and 90% of which are blasts.  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  Since patient's blood cultures was gram-positive cocci empiric antibiotics for meningitis was already started.  Discussed with Dr. Arlan Organ on-call oncologist.  Will notify Dr. Leonides Schanz patient's oncologist.  Patient's wife updated.  Midge Minium

## 2023-05-28 NOTE — Progress Notes (Addendum)
 PROGRESS NOTE    Alex Cortez  QMV:[id number]  DOB: 08-19-1950  DOA: 05/25/2023 PCP: Juliette Alcide, MD Outpatient Specialists:   Hospital course:  73 year old man with CAD, DM 2, HTN and H/oh CVA was admitted to [facility] for progressive weakness and decreasing ability to walk over the past week.  Workup revealed progressive pericolonic inflammatory stranding and a 3 x 6 soft tissue density within the presacral space which may be a phlegmon or an infiltrative mass.  In addition MRI brain showed 6 cm extra axial mass that is likely a meningioma.  Patient is transferred to [facility] for biopsy of presacral area and for LP   Subjective:  Patient states he is sleepy.  Discussion with wife about findings of white cells in CSF and implications are documented below   Objective: Vitals:   05/27/23 2133 05/28/23 0442 05/28/23 1012 05/28/23 1208  BP: 115/80 (!) 109/54 (!) 133/58 (!) 153/63  Pulse: 73 62 (!) 57 65  Resp: 19 19  18   Temp: 98.6 F (37 C) 98.4 F (36.9 C)  98.3 F (36.8 C)  TempSrc: Oral Oral  Oral  SpO2: 99% 100% 100% 100%  Weight:      Height:        Intake/Output Summary (Last 24 hours) at 05/28/2023 1715 Last data filed at 05/28/2023 1554 Gross per 24 hour  Intake 1804.77 ml  Output 1250 ml  Net 554.77 ml   Filed Weights   05/25/23 1746 05/26/23 0002  Weight: 112 kg 103.1 kg     Exam:  General: Patient lying in bed getting IV placed looking somewhat confused with attentive wife at bedside Eyes: sclera anicteric, conjuctiva mild injection bilaterally CVS: S1-S2, regular  Respiratory:  decreased air entry bilaterally secondary to decreased inspiratory effort, rales at bases  GI: NABS, soft, NT  LE: Warm and well-perfused  Data Reviewed:  Basic Metabolic Panel: Recent Labs  Lab 05/25/23 1945 05/26/23 0414 05/28/23 0517  NA 137 141 140  K 5.4* 4.5 4.2  CL 104 110 112*  CO2 20* 20* 17*  GLUCOSE 212* 139* 86  BUN 34* 35* 25*   CREATININE 1.79* 1.34* 1.00  CALCIUM 9.7 9.0 8.9    CBC: Recent Labs  Lab 05/25/23 1945 05/26/23 0414 05/28/23 0517  WBC 6.1 5.5 5.3  NEUTROABS 3.6  --  2.8  HGB 13.6 12.3* 11.9*  HCT 43.2 38.9* 39.9  MCV 81.7 82.2 85.6  PLT 244 189 136*     Scheduled Meds:  allopurinol  300 mg Oral Daily   carvedilol  6.25 mg Oral BID WC   clopidogrel  75 mg Oral Daily   enoxaparin (LOVENOX) injection  40 mg Subcutaneous Daily   ezetimibe  10 mg Oral Daily   insulin aspart  0-20 Units Subcutaneous Q4H   levothyroxine  75 mcg Oral Q0600   rosuvastatin  40 mg Oral Daily   tamsulosin  0.4 mg Oral Daily   Continuous Infusions:  sodium chloride 100 mL/hr at 05/28/23 0520   ampicillin (OMNIPEN) IV 2 g (05/28/23 1334)   cefTRIAXone (ROCEPHIN)  IV Stopped (05/28/23 0549)   vancomycin 1,250 mg (05/28/23 1508)     Assessment & Plan:    Altered mental status with confusion Abnormal MRI  Diffuse large B-cell lymphoma Patient with abnormal leptomeningeal enhancement  LP done yesterday with verbal critical result of 600 WBCs thought to be monocytes in CSF No bacteria seen on CSF Results are not available on Epic however Dr.  Milton of neurology and Dr. Leonides Schanz have both discussed with lab and they appear to be either blasts or at the very least atypical cells.  Flow cytometry will be done on Monday. Discussed with patient's wife that if these cells are indeed cancerous, prognosis is very poor She states she understands and will talk to family Dr. Leonides Schanz will speak with her tomorrow  Refer to palliative care when she is ready to speak with them  GPC in 1 out of 2 blood culture Ceftriaxone and vancomycin started empirically last night Thought to be possible contaminant Will discontinue empiric antibiotics once GPC are speciated  Presacral mass 3 x 5 cm soft tissue density in presacral area, phlegmon versus infiltrative mass Of note patient has recently been treated for orchitis and still  has scrotal swelling Patient is afebrile and has no leukocytosis, UA is negative Patient underwent biopsy by IR yesterday results pending Patient has been discussed with Dr. Leonides Schanz and oncology in case biopsy shows some urological cell cancer  AKI Normalized with low-dose IVF  Scrotal edema Recent treatment for orchitis BPH Can consider urology consult either as inpatient or outpatient Continue tamsulosin  DM2 Continue SSI  HFpEF HTN CAD Echo February 2024 shows EF 55 to 60% Blood pressure improved and normalized with IV fluids Increase carvedilol back to usual home dose of 12.5 twice daily Restart Imdur tomorrow if BP tolerates carvedilol well  H/oh CVA Continue Plavix    DVT prophylaxis: Lovenox Code Status: Full Family Communication: Patient's wife was at bedside throughout     Studies: ECHOCARDIOGRAM COMPLETE Result Date: 05/28/2023    ECHOCARDIOGRAM REPORT   Patient Name:   Alex Cortez Date of Exam: 05/28/2023 Medical Rec #:  [id number]        Height:       75.0 in Accession #:    [id number]       Weight:       227.3 lb Date of Birth:  [birth year]        BSA:          2.317 m Patient Age:    72 years         BP:           133/58 mmHg Patient Gender: M                HR:           62 bpm. Exam Location:  Inpatient Procedure: 2D Echo, 3D Echo, Cardiac Doppler, Color Doppler and Strain Analysis            (Both Spectral and Color Flow Doppler were utilized during            procedure). Indications:    Bacteremia  History:        Patient has prior history of Echocardiogram examinations, most                 recent 04/19/2022. Previous Myocardial Infarction and CAD, Stroke;                 Risk Factors:Sleep Apnea, Hypertension, Diabetes and                 Dyslipidemia.  Sonographer:    Vern Claude Referring Phys: [phone] BELINDA K KYERE IMPRESSIONS  1. Left ventricular ejection fraction, by estimation, is 55 to 60%. The left ventricle has normal function. The left ventricle  has no regional wall motion abnormalities. Left ventricular diastolic parameters are consistent with Grade I  diastolic dysfunction (impaired relaxation). The average left ventricular global longitudinal strain is -18.7 %. The global longitudinal strain is normal.  2. Right ventricular systolic function is normal. The right ventricular size is normal.  3. The mitral valve is normal in structure. No evidence of mitral valve regurgitation. No evidence of mitral stenosis.  4. The aortic valve is normal in structure. Aortic valve regurgitation is not visualized. No aortic stenosis is present.  5. The inferior vena cava is normal in size with greater than 50% respiratory variability, suggesting right atrial pressure of 3 mmHg. Conclusion(s)/Recommendation(s): No evidence of valvular vegetations on this transthoracic echocardiogram. Consider a transesophageal echocardiogram to exclude infective endocarditis if clinically indicated. FINDINGS  Left Ventricle: Left ventricular ejection fraction, by estimation, is 55 to 60%. The left ventricle has normal function. The left ventricle has no regional wall motion abnormalities. The average left ventricular global longitudinal strain is -18.7 %. Strain was performed and the global longitudinal strain is normal. The left ventricular internal cavity size was normal in size. There is no left ventricular hypertrophy. Left ventricular diastolic parameters are consistent with Grade I diastolic dysfunction (impaired relaxation). Right Ventricle: The right ventricular size is normal. No increase in right ventricular wall thickness. Right ventricular systolic function is normal. Left Atrium: Left atrial size was normal in size. Right Atrium: Right atrial size was normal in size. Pericardium: There is no evidence of pericardial effusion. Presence of epicardial fat layer. Mitral Valve: The mitral valve is normal in structure. No evidence of mitral valve regurgitation. No evidence of mitral  valve stenosis. MV peak gradient, 3.5 mmHg. The mean mitral valve gradient is 1.0 mmHg. Tricuspid Valve: The tricuspid valve is normal in structure. Tricuspid valve regurgitation is not demonstrated. No evidence of tricuspid stenosis. Aortic Valve: The aortic valve is normal in structure. Aortic valve regurgitation is not visualized. No aortic stenosis is present. Aortic valve mean gradient measures 7.0 mmHg. Aortic valve peak gradient measures 13.5 mmHg. Aortic valve area, by VTI measures 1.78 cm. Pulmonic Valve: The pulmonic valve was normal in structure. Pulmonic valve regurgitation is not visualized. No evidence of pulmonic stenosis. Aorta: The aortic root is normal in size and structure. Venous: The inferior vena cava is normal in size with greater than 50% respiratory variability, suggesting right atrial pressure of 3 mmHg. IAS/Shunts: No atrial level shunt detected by color flow Doppler. Additional Comments: 3D was performed not requiring image post processing on an independent workstation and was normal.  LEFT VENTRICLE PLAX 2D LVIDd:         4.90 cm      Diastology LVIDs:         3.20 cm      LV e' medial:    5.98 cm/s LV PW:         1.00 cm      LV E/e' medial:  11.5 LV IVS:        0.90 cm      LV e' lateral:   8.05 cm/s LVOT diam:     2.30 cm      LV E/e' lateral: 8.6 LV SV:         76 LV SV Index:   33           2D Longitudinal Strain LVOT Area:     4.15 cm     2D Strain GLS Avg:     -18.7 %  LV Volumes (MOD) LV vol d, MOD A2C: 166.0 ml 3D Volume EF: LV vol  d, MOD A4C: 181.0 ml 3D EF:        56 % LV vol s, MOD A2C: 68.4 ml  LV EDV:       263 ml LV vol s, MOD A4C: 74.1 ml  LV ESV:       116 ml LV SV MOD A2C:     97.6 ml  LV SV:        147 ml LV SV MOD A4C:     181.0 ml LV SV MOD BP:      98.8 ml RIGHT VENTRICLE RV Basal diam:  4.00 cm RV Mid diam:    2.10 cm RV S prime:     12.90 cm/s LEFT ATRIUM             Index        RIGHT ATRIUM           Index LA diam:        3.80 cm 1.64 cm/m   RA Area:      13.60 cm LA Vol (A2C):   50.5 ml 21.80 ml/m  RA Volume:   29.40 ml  12.69 ml/m LA Vol (A4C):   34.5 ml 14.89 ml/m LA Biplane Vol: 42.0 ml 18.13 ml/m  AORTIC VALVE                     PULMONIC VALVE AV Area (Vmax):    1.77 cm      PV Vmax:          0.79 m/s AV Area (Vmean):   1.47 cm      PV Peak grad:     2.5 mmHg AV Area (VTI):     1.78 cm      PR End Diast Vel: 3.69 msec AV Vmax:           184.00 cm/s AV Vmean:          131.000 cm/s AV VTI:            0.427 m AV Peak Grad:      13.5 mmHg AV Mean Grad:      7.0 mmHg LVOT Vmax:         78.30 cm/s LVOT Vmean:        46.200 cm/s LVOT VTI:          0.183 m LVOT/AV VTI ratio: 0.43  AORTA Ao Root diam: 3.50 cm Ao Asc diam:  3.00 cm MITRAL VALVE MV Area (PHT): 2.42 cm    SHUNTS MV Area VTI:   2.28 cm    Systemic VTI:  0.18 m MV Peak grad:  3.5 mmHg    Systemic Diam: 2.30 cm MV Mean grad:  1.0 mmHg MV Vmax:       0.94 m/s MV Vmean:      58.0 cm/s MV Decel Time: 313 msec MV E velocity: 69.00 cm/s MV A velocity: 90.70 cm/s MV E/A ratio:  0.76 Kardie Tobb DO Electronically signed by Thomasene Ripple DO Signature Date/Time: 05/28/2023/12:57:40 PM    Final    CT BIOPSY Result Date: 05/27/2023 CLINICAL DATA:  Mental status change, history of diffuse large B-cell lymphoma, presacral pelvic mass noted on recent CT pelvis EXAM: CT GUIDED CORE BIOPSY OF PRESACRAL MASS CT-GUIDED LUMBAR PUNCTURE ANESTHESIA/SEDATION: Intravenous Fentanyl and Versed 1.5mg  were administered by RN during a total moderate (conscious) sedation time of 20 minutes; the patient's level of consciousness and physiological / cardiorespiratory status were monitored continuously by radiology RN under my  direct supervision. PROCEDURE: The procedure risks, benefits, and alternatives were explained to the spouse, due to the altered mental status. Questions regarding the procedure were encouraged and answered. The spouse understands and consents to the procedure. Patient placed prone. Select axial scans  through the lumbosacral spine obtained. Initially, an appropriate skin entry site was determined for lumbar puncture. Skin prepped with Betadine, draped in usual sterile fashion, infiltrated locally with 1% lidocaine. Under CT fluoroscopic guidance, a 20 gauge spinal needle advanced into the lower lumbar thecal sac. Clear colorless CSF return. 6 mL were collected among for sterile bowels for the requested laboratory studies. Needle was removed and Band-Aid placed. Based on the initial scout images, a an appropriate skin entry site for presacral biopsy was then determined and marked on the skin. Region prepped with chlorhexidine, draped in usual sterile fashion, infiltrated locally with 1% lidocaine. Under CT fluoroscopic guidance, a 17 gauge trocar needle was advanced to the margin of the lesion. Once needle tip position was confirmed, coaxial 18-gauge core biopsy samples were obtained, submitted in saline to surgical pathology. The guide needle was removed. . Postprocedure scans show no hemorrhage or other apparent complication. COMPLICATIONS: None immediate FINDINGS: Clear colorless CSF returned on lumbar puncture, 6 mL collected for the requested laboratory studies. The presacral mass was localized and representative 18 gauge core biopsy samples obtained as above, submitted in saline to surgical pathology. IMPRESSION: 1. Technically successful lumbar puncture under CT. 2. Technically successful presacral mass core biopsy under CT guidance. RADIATION DOSE REDUCTION: This exam was performed according to the departmental dose-optimization program which includes automated exposure control, adjustment of the mA and/or kV according to patient size and/or use of iterative reconstruction technique. Electronically Signed   By: Corlis Leak M.D.   On: 05/27/2023 16:56   CT GUIDED NEEDLE PLACEMENT Result Date: 05/27/2023 CLINICAL DATA:  Mental status change, history of diffuse large B-cell lymphoma, presacral pelvic mass  noted on recent CT pelvis EXAM: CT GUIDED CORE BIOPSY OF PRESACRAL MASS CT-GUIDED LUMBAR PUNCTURE ANESTHESIA/SEDATION: Intravenous Fentanyl and Versed 1.5mg  were administered by RN during a total moderate (conscious) sedation time of 20 minutes; the patient's level of consciousness and physiological / cardiorespiratory status were monitored continuously by radiology RN under my direct supervision. PROCEDURE: The procedure risks, benefits, and alternatives were explained to the spouse, due to the altered mental status. Questions regarding the procedure were encouraged and answered. The spouse understands and consents to the procedure. Patient placed prone. Select axial scans through the lumbosacral spine obtained. Initially, an appropriate skin entry site was determined for lumbar puncture. Skin prepped with Betadine, draped in usual sterile fashion, infiltrated locally with 1% lidocaine. Under CT fluoroscopic guidance, a 20 gauge spinal needle advanced into the lower lumbar thecal sac. Clear colorless CSF return. 6 mL were collected among for sterile bowels for the requested laboratory studies. Needle was removed and Band-Aid placed. Based on the initial scout images, a an appropriate skin entry site for presacral biopsy was then determined and marked on the skin. Region prepped with chlorhexidine, draped in usual sterile fashion, infiltrated locally with 1% lidocaine. Under CT fluoroscopic guidance, a 17 gauge trocar needle was advanced to the margin of the lesion. Once needle tip position was confirmed, coaxial 18-gauge core biopsy samples were obtained, submitted in saline to surgical pathology. The guide needle was removed. . Postprocedure scans show no hemorrhage or other apparent complication. COMPLICATIONS: None immediate FINDINGS: Clear colorless CSF returned on lumbar puncture, 6 mL collected for  the requested laboratory studies. The presacral mass was localized and representative 18 gauge core biopsy  samples obtained as above, submitted in saline to surgical pathology. IMPRESSION: 1. Technically successful lumbar puncture under CT. 2. Technically successful presacral mass core biopsy under CT guidance. RADIATION DOSE REDUCTION: This exam was performed according to the departmental dose-optimization program which includes automated exposure control, adjustment of the mA and/or kV according to patient size and/or use of iterative reconstruction technique. Electronically Signed   By: Corlis Leak M.D.   On: 05/27/2023 16:56    Principal Problem:   AKI (acute kidney injury) (HCC) Active Problems:   Meningioma (HCC)   Pelvic mass   Type 2 diabetes mellitus with vascular disease (HCC)   History of CVA with residual deficit   Essential hypertension   DLBCL (diffuse large B cell lymphoma) (HCC)   Chronic diastolic CHF (congestive heart failure) (HCC)     Denaisha Swango Tublu Fayetta Sorenson, [facility]  If 7PM-7AM, please contact night-coverage [email or web address]   LOS: 1 day

## 2023-05-28 NOTE — Progress Notes (Signed)
 PHARMACY - PHYSICIAN COMMUNICATION CRITICAL VALUE ALERT - BLOOD CULTURE IDENTIFICATION (BCID)  Alex Cortez is an 73 y.o. male who presented to [facility] on 05/25/2023 with a chief complaint of confusion, weakness, and poor urine production.   Assessment:  1/4 bottles staph species - could be contaminant  Name of physician (or Provider) Contacted: Luberta Robertson via [facility]  Current antibiotics: ampicillin, ceftriaxone, vancomycin  Changes to prescribed antibiotics recommended:  no changes   Results for orders placed or performed during the hospital encounter of 05/25/23  Blood Culture ID Panel (Reflexed) (Collected: 05/25/2023  7:59 PM)  Result Value Ref Range   Enterococcus faecalis NOT DETECTED NOT DETECTED   Enterococcus Faecium NOT DETECTED NOT DETECTED   Listeria monocytogenes NOT DETECTED NOT DETECTED   Staphylococcus species DETECTED (A) NOT DETECTED   Staphylococcus aureus (BCID) NOT DETECTED NOT DETECTED   Staphylococcus epidermidis NOT DETECTED NOT DETECTED   Staphylococcus lugdunensis NOT DETECTED NOT DETECTED   Streptococcus species NOT DETECTED NOT DETECTED   Streptococcus agalactiae NOT DETECTED NOT DETECTED   Streptococcus pneumoniae NOT DETECTED NOT DETECTED   Streptococcus pyogenes NOT DETECTED NOT DETECTED   A.calcoaceticus-baumannii NOT DETECTED NOT DETECTED   Bacteroides fragilis NOT DETECTED NOT DETECTED   Enterobacterales NOT DETECTED NOT DETECTED   Enterobacter cloacae complex NOT DETECTED NOT DETECTED   Escherichia coli NOT DETECTED NOT DETECTED   Klebsiella aerogenes NOT DETECTED NOT DETECTED   Klebsiella oxytoca NOT DETECTED NOT DETECTED   Klebsiella pneumoniae NOT DETECTED NOT DETECTED   Proteus species NOT DETECTED NOT DETECTED   Salmonella species NOT DETECTED NOT DETECTED   Serratia marcescens NOT DETECTED NOT DETECTED   Haemophilus influenzae NOT DETECTED NOT DETECTED   Neisseria meningitidis NOT DETECTED NOT DETECTED   Pseudomonas aeruginosa NOT  DETECTED NOT DETECTED   Stenotrophomonas maltophilia NOT DETECTED NOT DETECTED   Candida albicans NOT DETECTED NOT DETECTED   Candida auris NOT DETECTED NOT DETECTED   Candida glabrata NOT DETECTED NOT DETECTED   Candida krusei NOT DETECTED NOT DETECTED   Candida parapsilosis NOT DETECTED NOT DETECTED   Candida tropicalis NOT DETECTED NOT DETECTED   Cryptococcus neoformans/gattii NOT DETECTED NOT DETECTED    Herby Abraham, Pharm.D Use secure chat for questions 05/28/2023 8:07 AM

## 2023-05-28 NOTE — Progress Notes (Addendum)
 Pharmacy Antibiotic Note  Alex Cortez is a 73 y.o. male admitted on 05/25/2023 for image guided LP and presacral mass biopsy.  PMH significant for large B-cell lymphoma.  Blood culture = Gram + cocci in 2/2 bottles. Pharmacy has been consulted for Vancomycin dosing.  Plan: Vancomycin 2gm IV x 1 followed by Vancomycin 1000 mg IV Q 12 hrs. Goal AUC 400-550.  Expected AUC: 507.3  SCr used: 1.34 Follow renal function F/u final culture results & sensitivities  Height: 6\' 3"  (190.5 cm) Weight: 103.1 kg (227 lb 4.7 oz) IBW/kg (Calculated) : 84.5  Temp (24hrs), Avg:98.4 F (36.9 C), Min:98.1 F (36.7 C), Max:98.6 F (37 C)  Recent Labs  Lab 05/25/23 1945 05/25/23 1957 05/25/23 2150 05/26/23 0414  WBC 6.1  --   --  5.5  CREATININE 1.79*  --   --  1.34*  LATICACIDVEN  --  2.2* 2.2*  --     Estimated Creatinine Clearance: 63.8 mL/min (A) (by C-G formula based on SCr of 1.34 mg/dL (H)).    Allergies  Allergen Reactions   Ace Inhibitors Swelling    Angioedema   Other Swelling    Shellfish   Swelling and hives  Shellfish  Swelling and hives   Rituxan [Rituximab] Other (See Comments)    Pt had sensitivity reaction to Rituxan. See progress note from 05/14/22. Pt able to complete infusion.    Shrimp [Shellfish Allergy] Hives and Swelling    Antimicrobials this admission: 3/15 Vancomycin >>      Dose adjustments this admission:    Microbiology results: 3/12 BCx: GPC (2/2 bottles)    Thank you for allowing pharmacy to be a part of this patient's care.  Maryellen Pile, PharmD 05/28/2023 1:57 AM  ADDENDUM: Contacted by Dr Toniann Fail to initiate Ceftriaxone and Ampicillin in addition to previously ordered Vancomycin for r/o meningitis  Plan: Vancomycin per above Ampicillin 2gm IV q4h Ceftriaxone 2gm IV q12h  Terrilee Files, PharmD 05/28/2023 @ 03:26

## 2023-05-28 NOTE — Plan of Care (Signed)
  Problem: Education: Goal: Knowledge of General Education information will improve Description: Including pain rating scale, medication(s)/side effects and non-pharmacologic comfort measures Outcome: Progressing   Problem: Health Behavior/Discharge Planning: Goal: Ability to manage health-related needs will improve Outcome: Progressing   Problem: Clinical Measurements: Goal: Ability to maintain clinical measurements within normal limits will improve Outcome: Progressing   Problem: Clinical Measurements: Goal: Will remain free from infection Outcome: Progressing   Problem: Clinical Measurements: Goal: Respiratory complications will improve Outcome: Progressing   Problem: Clinical Measurements: Goal: Cardiovascular complication will be avoided Outcome: Progressing   Problem: Nutrition: Goal: Adequate nutrition will be maintained Outcome: Progressing   Problem: Coping: Goal: Level of anxiety will decrease Outcome: Progressing   Problem: Elimination: Goal: Will not experience complications related to bowel motility Outcome: Progressing   Problem: Nutritional: Goal: Maintenance of adequate nutrition will improve Outcome: Progressing

## 2023-05-28 NOTE — Progress Notes (Signed)
  Echocardiogram 2D Echocardiogram has been performed.  Ocie Doyne RDCS 05/28/2023, 10:44 AM

## 2023-05-28 NOTE — Plan of Care (Signed)

## 2023-05-28 NOTE — Consult Note (Signed)
 [facility] TeleNeurology Consult Services  Stat Consult  Patient Name:   Alex Cortez, Alex Cortez Date of Birth:   [birth year] Identification Number:   MRN - [id number] Date of Service:   05/28/2023 01:51:51  Diagnosis:       R41.0 - Disorientation, unspecified  Impression This is a 73 year old man with history of hyperlipidemia, hypertension, diabetes, stroke here with altered mental status, weakness, and abdominal pain, neurology consulted for altered mental status and abnormal lab results. The patient was noted to have elevated nucleated cells in CSF; given that these are 90% blasts (per discussion with the lab) I am more suspicious for a carcinomatous meningitis than an infectious process. The patient had been started on empiric antibiotics. I ordered CSF protein and glucose although it is unclear whether these can be obtained- as- per lab- there is no more CSF   Recommendations: Our recommendations are outlined below.  Dispositions : Neurology will follow  Miscellaneous : Needs heme-onc follow up for CSF findings CSF results are not in Epic CSF protein/ glucose (unclear whether these can be obtained- we may be out of CSF)    ----------------------------------------------------------------------------------------------------    Metrics: Dispatch Time: 05/28/2023 01:45:40 Callback Response Time: 05/28/2023 01:54:30  Primary Provider Notified of Diagnostic Impression and Management Plan on: 05/28/2023 04:08:15     ----------------------------------------------------------------------------------------------------  Chief Complaint: ams  History of Present Illness: Patient is a 73 year old Male. This is a 73 year old man with history of hyperlipidemia, hypertension, diabetes, stroke here with altered mental status, weakness, and abdominal pain, neurology consulted for altered mental status and abnormal lab results. The patient was admitted on 05/25/2023. He  was transferred to [facility] for biopsy of a sacral mass and lumbar puncture. The patient underwent lumbar puncture 05/27/2023. RN received a call from the lab- and- a report that CSF showed 510 nucleated cells in one tube and 595 nucleated cells in another. RN called the daytime doctor and- the message was passed to the doctor covering this evening. There were questions regarding whether the patient should be started on antibiotics. Per RN- the daytime doctor was unable to locate the lab results and, the night covering doctor was not able to locate these either. RN was not able to locate the results. I called the lab- and was told that in one tube there were 595 nucleated cells and that in another there were 510 nucleated cells. There were 250 RBC's. The lab person was unsure of which tube the 250 RBC's were in. The differential was 7% neutrophils, 3% monocytes, 90% blasts. The lab person was unsure of which specific tube had this differential. Protein and glucose were not obtained   Past Medical History:      Hypertension      Hyperlipidemia      Stroke Other PMH:  history of hyperlipidemia, hypertension, diabetes, stroke unable to obtain due to:   Patient Is Confused  Medications:  No Anticoagulant use  No Antiplatelet use Reviewed EMR for current medications  Allergies:   Allergies Unable To Obtain Due To: Patient Is Confused  Social History: Drug Use: No Unable To Obtain Due To Patient Status : Patient Is Confused  Family History:  Family History Cannot Be Obtained Because:Patient Is Confused  ROS : ROS Cannot Be Obtained Because:  Patient Is Confused  Past Surgical History: Past Surgical History Cannot Be Obtained Because: Patient Is Confused There Is No Surgical History Contributory To Today's Visit   Examination: BP(109/54), Pulse(62),  Neuro Exam:  General: Sleeping wakes easily oriented x 1  Speech: very little verbal output  Language: very little verbal  output  Face: no frank unilateral facial droop although face is not perfectly symmetric  Facial Sensation: -  Visual Fields: =  Extraocular Movements: no gaze deviation or preference  Motor Exam: good handgrip upper extremites wiggles toes lower extremities  Sensation: =  Coordination: =  -   Spoke with : Dr Toniann Fail    This consult was conducted in real time using interactive audio and Immunologist. Patient was informed of the technology being used for this visit and agreed to proceed. Patient located in hospital and provider located at home/office setting.  Patient is being evaluated for possible acute neurologic impairment and high probability of imminent or life - threatening deterioration.I spent total of 35 minutes providing care to this patient, including time for face to face visit via telemedicine, review of medical records, imaging studies and discussion of findings with providers, the patient and / or family.   Dr Rosalita Chessman   [facility] For Inpatient follow-up with [facility] physician please call [facility] at [phone]. As we are not an outpatient service for any post hospital discharge needs please contact the hospital for assistance.  If you have any questions for the [facility] physicians or need to reconsult for clinical or diagnostic changes please contact us via [facility] at [phone].

## 2023-05-29 DIAGNOSIS — R4182 Altered mental status, unspecified: Secondary | ICD-10-CM | POA: Diagnosis not present

## 2023-05-29 DIAGNOSIS — N179 Acute kidney failure, unspecified: Secondary | ICD-10-CM | POA: Diagnosis not present

## 2023-05-29 DIAGNOSIS — Z8572 Personal history of non-Hodgkin lymphomas: Secondary | ICD-10-CM | POA: Diagnosis not present

## 2023-05-29 LAB — GLUCOSE, CAPILLARY
Glucose-Capillary: 104 mg/dL — ABNORMAL HIGH (ref 70–99)
Glucose-Capillary: 151 mg/dL — ABNORMAL HIGH (ref 70–99)
Glucose-Capillary: 159 mg/dL — ABNORMAL HIGH (ref 70–99)
Glucose-Capillary: 175 mg/dL — ABNORMAL HIGH (ref 70–99)
Glucose-Capillary: 211 mg/dL — ABNORMAL HIGH (ref 70–99)
Glucose-Capillary: 98 mg/dL (ref 70–99)

## 2023-05-29 LAB — CULTURE, BLOOD (ROUTINE X 2)

## 2023-05-29 MED ORDER — VANCOMYCIN HCL 750 MG/150ML IV SOLN
750.0000 mg | Freq: Two times a day (BID) | INTRAVENOUS | Status: DC
  Administered 2023-05-29 – 2023-05-30 (×2): 750 mg via INTRAVENOUS
  Filled 2023-05-29 (×3): qty 150

## 2023-05-29 NOTE — Progress Notes (Signed)
 Hematology/Oncology Progress Note  Clinical Summary: [name] is a 73 year old male with medical history significant for diffuse large B-cell lymphoma in remission who presents with evaluation of altered mental status was found to have concern for meningitis versus CNS recurrence of his lymphoma.  Interval History: -- [name] is alert and awake, much improved from prior. --Notes having some pain in his back, approximately the site of the LP --Notes that he is not having any headache, vision changes, fevers, chills, sweats -- MRI brain on 05/26/2023 showed abnormal leptomeningeal enhancement, which could be infectious or lymphomatous.  There was also a 13 mm area of enhancement in the inferior left cerebellum with concerns for possible subacute infarct versus CNS lymphoma.  -- CT of the abdomen performed on 05/25/2023 showed a progressive soft tissue density within the presacral space, measuring 3.1 x 3.1 x 5.7 cm. -- Labs show white blood cell 5.3, hemoglobin 11.9, MCV 85.6, platelets 136 -- Patient is on broad-spectrum antibiotic therapy with ampicillin and vancomycin  O:  Vitals:   05/29/23 0945 05/29/23 1221  BP: 130/65 124/68  Pulse: 60 69  Resp:  16  Temp:  97.7 F (36.5 C)  SpO2:  99%      Latest Ref Rng & Units 05/28/2023    5:17 AM 05/26/2023    4:14 AM 05/25/2023    7:45 PM  CMP  Glucose 70 - 99 mg/dL 86  130  865   BUN 8 - 23 mg/dL 25  35  34   Creatinine 0.61 - 1.24 mg/dL 7.84  6.96  2.95   Sodium 135 - 145 mmol/L 140  141  137   Potassium 3.5 - 5.1 mmol/L 4.2  4.5  5.4   Chloride 98 - 111 mmol/L 112  110  104   CO2 22 - 32 mmol/L 17  20  20    Calcium 8.9 - 10.3 mg/dL 8.9  9.0  9.7   Total Protein 6.5 - 8.1 g/dL   8.1   Total Bilirubin 0.0 - 1.2 mg/dL   1.4   Alkaline Phos 38 - 126 U/L   61   AST 15 - 41 U/L   20   ALT 0 - 44 U/L   28       Latest Ref Rng & Units 05/28/2023    5:17 AM 05/26/2023    4:14 AM 05/25/2023    7:45 PM  CBC  WBC 4.0 - 10.5  K/uL 5.3  5.5  6.1   Hemoglobin 13.0 - 17.0 g/dL 28.4  13.2  44.0   Hematocrit 39.0 - 52.0 % 39.9  38.9  43.2   Platelets 150 - 400 K/uL 136  189  244       GENERAL: Well-appearing elderly African-American male in NAD  SKIN: skin color, texture, turgor are normal, no rashes or significant lesions EYES: conjunctiva are pink and non-injected, sclera clear LUNGS: clear to auscultation and percussion with normal breathing effort HEART: regular rate & rhythm and no murmurs and no lower extremity edema ABDOMEN: soft, non-tender, non-distended, normal bowel sounds Musculoskeletal: no cyanosis of digits and no clubbing  PSYCH: Alert and oriented, responds appropriately.  Assessment/Plan: [name] is a 73 year old male with medical history significant for diffuse large B-cell lymphoma in remission who presents with evaluation of altered mental status was found to have concern for meningitis versus CNS recurrence of his lymphoma.  # Acute Alerted Mental Status # Concern for Leptomeningeal Recurrence of DLBCL vs Infection --  Awaiting results of lumbar puncture and biopsy of soft tissue mass in the presacral region. -- At this time findings are concerning for possible recurrence in the CNS of the patient's diffuse large B-cell lymphoma. -- Of note the patient has been in remission since 10/25/2022, with his last chemo in June 2024 -- labs today show WBC 5.3, hemoglobin 11.9, MCV 85.6, platelets 136 -- If the patient is found to have a CNS recurrence of his diffuse large B-cell lymphoma would recommend palliative care consult with goals of care discussion.  His clinical status is improved, he may be a candidate for treatment if we do find recurrence --Oncology service will continue to follow.    Alex Barns, MD Department of Hematology/Oncology [facility] Phone: [phone] Pager: [phone] Email: [email or web address]

## 2023-05-29 NOTE — Plan of Care (Signed)

## 2023-05-29 NOTE — Progress Notes (Signed)
 PHARMACY NOTE:  ANTIMICROBIAL DOSAGE ADJUSTMENT  Current antimicrobial regimen includes a mismatch between antimicrobial dosage and estimated renal function. As per policy approved by the Pharmacy & Therapeutics and Medical Executive Committees, the antimicrobial dosage will be adjusted accordingly.  Current antimicrobial dosage: vancomycin 1250 mg IV q12h  Indication: r/o meningitis, bacteremia  Renal Function:  Estimated Creatinine Clearance: 85.5 mL/min (by C-G formula based on SCr of 1 mg/dL).    Antimicrobial dosage has been changed to: vancomycin 750 mg IV q12h   Thank you for allowing pharmacy to be a part of this patient's care.   Pricilla Riffle, PharmD, BCPS Clinical Pharmacist 05/29/2023 10:22 AM

## 2023-05-29 NOTE — Progress Notes (Signed)
 PROGRESS NOTE    Alex Cortez  YNW:[id number]  DOB: January 27, 1951  DOA: 05/25/2023 PCP: Juliette Alcide, MD Outpatient Specialists:   Hospital course:  73 year old man with CAD, DM 2, HTN and H/oh CVA was admitted to [facility] for progressive weakness and decreasing ability to walk over the past week.  Workup revealed progressive pericolonic inflammatory stranding and a 3 x 6 soft tissue density within the presacral space which may be a phlegmon or an infiltrative mass.  In addition MRI brain showed 6 cm extra axial mass that is likely a meningioma.  Patient is transferred to [facility] for biopsy of presacral area and for LP   Subjective:  Patient states he feels better.  States he was appreciate to of Dr. Leonides Schanz coming by.  Denies headache.   Objective: Vitals:   05/28/23 1925 05/29/23 0351 05/29/23 0945 05/29/23 1221  BP: 126/71 130/65 130/65 124/68  Pulse: 61 (!) 59 60 69  Resp: 16 17  16   Temp: 98.1 F (36.7 C) 98.1 F (36.7 C)  97.7 F (36.5 C)  TempSrc: Oral   Oral  SpO2: 100% 100%  99%  Weight:      Height:        Intake/Output Summary (Last 24 hours) at 05/29/2023 1735 Last data filed at 05/29/2023 1403 Gross per 24 hour  Intake 1496.83 ml  Output 800 ml  Net 696.83 ml   Filed Weights   05/25/23 1746 05/26/23 0002  Weight: 112 kg 103.1 kg     Exam:  General: Patient lying in bed getting IV placed looking somewhat confused with attentive wife at bedside Eyes: sclera anicteric, conjuctiva mild injection bilaterally CVS: S1-S2, regular  Respiratory:  decreased air entry bilaterally secondary to decreased inspiratory effort, rales at bases  GI: NABS, soft, NT  LE: Warm and well-perfused  Data Reviewed:  Basic Metabolic Panel: Recent Labs  Lab 05/25/23 1945 05/26/23 0414 05/28/23 0517  NA 137 141 140  K 5.4* 4.5 4.2  CL 104 110 112*  CO2 20* 20* 17*  GLUCOSE 212* 139* 86  BUN 34* 35* 25*  CREATININE 1.79* 1.34* 1.00  CALCIUM 9.7 9.0  8.9    CBC: Recent Labs  Lab 05/25/23 1945 05/26/23 0414 05/28/23 0517  WBC 6.1 5.5 5.3  NEUTROABS 3.6  --  2.8  HGB 13.6 12.3* 11.9*  HCT 43.2 38.9* 39.9  MCV 81.7 82.2 85.6  PLT 244 189 136*     Scheduled Meds:  allopurinol  300 mg Oral Daily   carvedilol  12.5 mg Oral BID WC   clopidogrel  75 mg Oral Daily   enoxaparin (LOVENOX) injection  40 mg Subcutaneous Daily   ezetimibe  10 mg Oral Daily   insulin aspart  0-20 Units Subcutaneous Q4H   levothyroxine  75 mcg Oral Q0600   rosuvastatin  40 mg Oral Daily   tamsulosin  0.4 mg Oral Daily   Continuous Infusions:  ampicillin (OMNIPEN) IV 2 g (05/29/23 1223)   cefTRIAXone (ROCEPHIN)  IV Stopped (05/29/23 0459)   vancomycin 750 mg (05/29/23 1632)     Assessment & Plan:    Altered mental status with confusion Abnormal MRI--concern for leptomeningeal recurrence of DLBCL  Diffuse large B-cell lymphoma Mental status is improving Patient with abnormal leptomeningeal enhancement  LP done yesterday with verbal critical result of 600 WBCs thought to be monocytes in CSF No bacteria seen on CSF Results are not available on Epic however Dr. Stephannie Peters of neurology and Dr.  Leonides Schanz have both discussed with lab and they appear to be either blasts or at the very least atypical cells.  Flow cytometry will be done on Monday. Discussed with patient's wife that if these cells are indeed cancerous, prognosis is very poor. Refer to palliative care when she is ready to speak with them  GPC in 1 out of 2 blood culture Ceftriaxone and vancomycin started empirically last night Thought to be possible contaminant Will discontinue empiric antibiotics once GPC are speciated  Presacral mass 3 x 5 cm soft tissue density in presacral area, phlegmon versus infiltrative mass Of note patient has recently been treated for orchitis and still has scrotal swelling Patient is afebrile and has no leukocytosis, UA is negative Patient underwent biopsy by IR  yesterday results pending Patient has been discussed with Dr. Leonides Schanz and oncology in case biopsy shows some urological cell cancer  AKI Normalized with low-dose IVF  Scrotal edema Recent treatment for orchitis BPH Can consider urology consult either as inpatient or outpatient Continue tamsulosin  DM2 Continue SSI  HFpEF HTN CAD Echo February 2024 shows EF 55 to 60% Blood pressure improved and normalized with IV fluids Increase carvedilol back to usual home dose of 12.5 twice daily Restart Imdur tomorrow if BP tolerates carvedilol well  H/oh CVA Continue Plavix    DVT prophylaxis: Lovenox Code Status: Full Family Communication: Patient's wife was at bedside throughout     Studies: ECHOCARDIOGRAM COMPLETE Result Date: 05/28/2023    ECHOCARDIOGRAM REPORT   Patient Name:   Alex Cortez Date of Exam: 05/28/2023 Medical Rec #:  [id number]        Height:       75.0 in Accession #:    [id number]       Weight:       227.3 lb Date of Birth:  06/16/1950        BSA:          2.317 m Patient Age:    72 years         BP:           133/58 mmHg Patient Gender: M                HR:           62 bpm. Exam Location:  Inpatient Procedure: 2D Echo, 3D Echo, Cardiac Doppler, Color Doppler and Strain Analysis            (Both Spectral and Color Flow Doppler were utilized during            procedure). Indications:    Bacteremia  History:        Patient has prior history of Echocardiogram examinations, most                 recent 04/19/2022. Previous Myocardial Infarction and CAD, Stroke;                 Risk Factors:Sleep Apnea, Hypertension, Diabetes and                 Dyslipidemia.  Sonographer:    Vern Claude Referring Phys: [phone] BELINDA K KYERE IMPRESSIONS  1. Left ventricular ejection fraction, by estimation, is 55 to 60%. The left ventricle has normal function. The left ventricle has no regional wall motion abnormalities. Left ventricular diastolic parameters are consistent with Grade I  diastolic dysfunction (impaired relaxation). The average left ventricular global longitudinal strain is -18.7 %. The global longitudinal strain is normal.  2.  Right ventricular systolic function is normal. The right ventricular size is normal.  3. The mitral valve is normal in structure. No evidence of mitral valve regurgitation. No evidence of mitral stenosis.  4. The aortic valve is normal in structure. Aortic valve regurgitation is not visualized. No aortic stenosis is present.  5. The inferior vena cava is normal in size with greater than 50% respiratory variability, suggesting right atrial pressure of 3 mmHg. Conclusion(s)/Recommendation(s): No evidence of valvular vegetations on this transthoracic echocardiogram. Consider a transesophageal echocardiogram to exclude infective endocarditis if clinically indicated. FINDINGS  Left Ventricle: Left ventricular ejection fraction, by estimation, is 55 to 60%. The left ventricle has normal function. The left ventricle has no regional wall motion abnormalities. The average left ventricular global longitudinal strain is -18.7 %. Strain was performed and the global longitudinal strain is normal. The left ventricular internal cavity size was normal in size. There is no left ventricular hypertrophy. Left ventricular diastolic parameters are consistent with Grade I diastolic dysfunction (impaired relaxation). Right Ventricle: The right ventricular size is normal. No increase in right ventricular wall thickness. Right ventricular systolic function is normal. Left Atrium: Left atrial size was normal in size. Right Atrium: Right atrial size was normal in size. Pericardium: There is no evidence of pericardial effusion. Presence of epicardial fat layer. Mitral Valve: The mitral valve is normal in structure. No evidence of mitral valve regurgitation. No evidence of mitral valve stenosis. MV peak gradient, 3.5 mmHg. The mean mitral valve gradient is 1.0 mmHg. Tricuspid Valve: The  tricuspid valve is normal in structure. Tricuspid valve regurgitation is not demonstrated. No evidence of tricuspid stenosis. Aortic Valve: The aortic valve is normal in structure. Aortic valve regurgitation is not visualized. No aortic stenosis is present. Aortic valve mean gradient measures 7.0 mmHg. Aortic valve peak gradient measures 13.5 mmHg. Aortic valve area, by VTI measures 1.78 cm. Pulmonic Valve: The pulmonic valve was normal in structure. Pulmonic valve regurgitation is not visualized. No evidence of pulmonic stenosis. Aorta: The aortic root is normal in size and structure. Venous: The inferior vena cava is normal in size with greater than 50% respiratory variability, suggesting right atrial pressure of 3 mmHg. IAS/Shunts: No atrial level shunt detected by color flow Doppler. Additional Comments: 3D was performed not requiring image post processing on an independent workstation and was normal.  LEFT VENTRICLE PLAX 2D LVIDd:         4.90 cm      Diastology LVIDs:         3.20 cm      LV e' medial:    5.98 cm/s LV PW:         1.00 cm      LV E/e' medial:  11.5 LV IVS:        0.90 cm      LV e' lateral:   8.05 cm/s LVOT diam:     2.30 cm      LV E/e' lateral: 8.6 LV SV:         76 LV SV Index:   33           2D Longitudinal Strain LVOT Area:     4.15 cm     2D Strain GLS Avg:     -18.7 %  LV Volumes (MOD) LV vol d, MOD A2C: 166.0 ml 3D Volume EF: LV vol d, MOD A4C: 181.0 ml 3D EF:        56 % LV vol s, MOD A2C: 68.4  ml  LV EDV:       263 ml LV vol s, MOD A4C: 74.1 ml  LV ESV:       116 ml LV SV MOD A2C:     97.6 ml  LV SV:        147 ml LV SV MOD A4C:     181.0 ml LV SV MOD BP:      98.8 ml RIGHT VENTRICLE RV Basal diam:  4.00 cm RV Mid diam:    2.10 cm RV S prime:     12.90 cm/s LEFT ATRIUM             Index        RIGHT ATRIUM           Index LA diam:        3.80 cm 1.64 cm/m   RA Area:     13.60 cm LA Vol (A2C):   50.5 ml 21.80 ml/m  RA Volume:   29.40 ml  12.69 ml/m LA Vol (A4C):   34.5 ml 14.89  ml/m LA Biplane Vol: 42.0 ml 18.13 ml/m  AORTIC VALVE                     PULMONIC VALVE AV Area (Vmax):    1.77 cm      PV Vmax:          0.79 m/s AV Area (Vmean):   1.47 cm      PV Peak grad:     2.5 mmHg AV Area (VTI):     1.78 cm      PR End Diast Vel: 3.69 msec AV Vmax:           184.00 cm/s AV Vmean:          131.000 cm/s AV VTI:            0.427 m AV Peak Grad:      13.5 mmHg AV Mean Grad:      7.0 mmHg LVOT Vmax:         78.30 cm/s LVOT Vmean:        46.200 cm/s LVOT VTI:          0.183 m LVOT/AV VTI ratio: 0.43  AORTA Ao Root diam: 3.50 cm Ao Asc diam:  3.00 cm MITRAL VALVE MV Area (PHT): 2.42 cm    SHUNTS MV Area VTI:   2.28 cm    Systemic VTI:  0.18 m MV Peak grad:  3.5 mmHg    Systemic Diam: 2.30 cm MV Mean grad:  1.0 mmHg MV Vmax:       0.94 m/s MV Vmean:      58.0 cm/s MV Decel Time: 313 msec MV E velocity: 69.00 cm/s MV A velocity: 90.70 cm/s MV E/A ratio:  0.76 Kardie Tobb DO Electronically signed by Thomasene Ripple DO Signature Date/Time: 05/28/2023/12:57:40 PM    Final     Principal Problem:   AKI (acute kidney injury) (HCC) Active Problems:   Meningioma (HCC)   Pelvic mass   Type 2 diabetes mellitus with vascular disease (HCC)   History of CVA with residual deficit   Essential hypertension   DLBCL (diffuse large B cell lymphoma) (HCC)   Chronic diastolic CHF (congestive heart failure) (HCC)     Diana Davenport Tublu Avinash Maltos, [facility]  If 7PM-7AM, please contact night-coverage [email or web address]   LOS: 2 days

## 2023-05-30 DIAGNOSIS — N179 Acute kidney failure, unspecified: Secondary | ICD-10-CM | POA: Diagnosis not present

## 2023-05-30 DIAGNOSIS — R410 Disorientation, unspecified: Secondary | ICD-10-CM | POA: Diagnosis not present

## 2023-05-30 LAB — CULTURE, BLOOD (ROUTINE X 2): Culture: NO GROWTH

## 2023-05-30 LAB — GLUCOSE, CAPILLARY
Glucose-Capillary: 107 mg/dL — ABNORMAL HIGH (ref 70–99)
Glucose-Capillary: 133 mg/dL — ABNORMAL HIGH (ref 70–99)
Glucose-Capillary: 150 mg/dL — ABNORMAL HIGH (ref 70–99)
Glucose-Capillary: 89 mg/dL (ref 70–99)
Glucose-Capillary: 95 mg/dL (ref 70–99)
Glucose-Capillary: 98 mg/dL (ref 70–99)

## 2023-05-30 NOTE — Plan of Care (Signed)

## 2023-05-30 NOTE — Progress Notes (Signed)
 Physical Therapy Treatment Patient Details Name: Alex Cortez MRN: [id number] DOB: 02/03/1951 Today's Date: 05/30/2023   History of Present Illness Alex Cortez is a 73 y.o. male with medical history significant for coronary artery disease, type  2 diabetes mellitus, dyslipidemia, essential hypertension, CVA and coronary artery disease, who presented to the emergency room with a [place] of lower abdominal pain with associated nausea and vomiting without diarrhea.  No bilious vomitus or hematemesis.  No dysuria, oliguria or hematuria or flank pain.  No fever or chills.  No cough or wheezing or dyspnea.  No chest pain or palpitations.  He has been having altered mental status with mild confusion.  He denied any paresthesias or focal muscle weakness.  No witnessed seizures.  No urinary or stool incontinence.  He was seen on outpatient basis a couple weeks ago and started on p.o. Bactrim for suspected scrotal cellulitis.  He has been having scrotal and penile edema over the last several weeks.  When he did not tolerate Bactrim he was switched to p.o doxycycline and he recently finished it.  He has not gotten out of bed over the last 24 hours and has been having significant oliguria and feeling very weak.  He was initially unable to answer questions appropriately and was more appropriate during my interview.  No fever or chills..    PT Comments  PT - Cognition Comments: AxO x 2 appears "slightly impaired" requiring repeat instructions and present with "impaired" motor control/physically Last session Pt amb 30 feet.  General bed mobility comments: required increased assist this session.  Delayed motor control and posterior LOB with difficulty self correction. General transfer comment: required increased asisst this session.  + 2 assist.  Unsteady.   Poor motor control.  Limited activity tolerance. General Gait Details: decreased amb ability this session of only 4 feet.  Staggering.  Recliner pulled up  to Pt from behind. Pt will need ST Rehab at SNF to address mobility and functional decline prior to safely returning home.    If plan is discharge home, recommend the following: A lot of help with walking and/or transfers;Help with stairs or ramp for entrance;A little help with bathing/dressing/bathroom;Supervision due to cognitive status   Can travel by private vehicle     No  Equipment Recommendations       Recommendations for Other Services       Precautions / Restrictions Precautions Precautions: Fall Precaution/Restrictions Comments: AxO x 2 slightly groggy" but following all directions. Restrictions Weight Bearing Restrictions Per Provider Order: No     Mobility  Bed Mobility Overal bed mobility: Needs Assistance Bed Mobility: Supine to Sit     Supine to sit: Mod assist, Max assist     General bed mobility comments: required increased assist this session.  Delayed motor control and posterior LOB with difficulty self correction.    Transfers Overall transfer level: Needs assistance Equipment used: Rolling walker (2 wheels) Transfers: Sit to/from Stand Sit to Stand: Mod assist, +2 physical assistance, +2 safety/equipment           General transfer comment: required increased asisst this session.  + 2 assist.  Unsteady.   Poor motor control.  Limited activity tolerance.    Ambulation/Gait Ambulation/Gait assistance: Mod assist, Max assist, +2 physical assistance, +2 safety/equipment Gait Distance (Feet): 4 Feet Assistive device: Rolling walker (2 wheels) Gait Pattern/deviations: Decreased step length - right, Decreased step length - left, Decreased stride length, Wide base of support Gait velocity: decreased  General Gait Details: decreased amb ability this session of only 4 feet.  Staggering.  Recliner pulled up to Pt from behind.   Stairs             Wheelchair Mobility     Tilt Bed    Modified Rankin (Stroke Patients Only)        Balance                                            Communication    Cognition Arousal: Alert Behavior During Therapy: WFL for tasks assessed/performed   PT - Cognitive impairments: Orientation, Awareness, Attention, Safety/Judgement                       PT - Cognition Comments: AxO x 2 appears "slightly impaired" requiring repeat instructions and present with "impaired" motor control/physically Following commands: Intact Following commands impaired: Follows one step commands inconsistently    Cueing Cueing Techniques: Verbal cues, Tactile cues  Exercises      General Comments        Pertinent Vitals/Pain Pain Assessment Pain Assessment: No/denies pain    Home Living                          Prior Function            PT Goals (current goals can now be found in the care plan section) Progress towards PT goals: Progressing toward goals    Frequency    Min 3X/week      PT Plan      Co-evaluation              AM-PAC PT "6 Clicks" Mobility   Outcome Measure  Help needed turning from your back to your side while in a flat bed without using bedrails?: A Lot Help needed moving from lying on your back to sitting on the side of a flat bed without using bedrails?: A Lot Help needed moving to and from a bed to a chair (including a wheelchair)?: A Lot Help needed standing up from a chair using your arms (e.g., wheelchair or bedside chair)?: A Lot Help needed to walk in hospital room?: A Lot Help needed climbing 3-5 steps with a railing? : Total 6 Click Score: 11    End of Session Equipment Utilized During Treatment: Gait belt Activity Tolerance: Patient limited by fatigue Patient left: in chair;with call bell/phone within reach;with chair alarm set;with family/visitor present Nurse Communication: Mobility status PT Visit Diagnosis: Unsteadiness on feet (R26.81);Other abnormalities of gait and mobility (R26.89);Muscle  weakness (generalized) (M62.81);History of falling (Z91.81);Difficulty in walking, not elsewhere classified (R26.2)     Time: 1056-1110 PT Time Calculation (min) (ACUTE ONLY): 14 min  Charges:    $Gait Training: 8-22 mins PT General Charges $$ ACUTE PT VISIT: 1 Visit                    Felecia Shelling  PTA Acute  Rehabilitation Services Office M-F          [phone]

## 2023-05-30 NOTE — Progress Notes (Signed)
 PROGRESS NOTE TAY Alex  Cortez:[id number]  DOB: 05-30-1950  DOA: 05/25/2023 PCP: Juliette Alcide, MD Hospital course:  73 year old man with CAD, DM 2, HTN and H/oh CVA was admitted to [facility] for progressive weakness and decreasing ability to walk over the past week.  Workup revealed progressive pericolonic inflammatory stranding and a 3 x 6 soft tissue density within the presacral space which may be a phlegmon or an infiltrative mass. MRI brain showed 6 cm extra axial mass that is likely a meningioma.  Patient is transferred to [facility] for biopsy of presacral area and for LP. Final cytology and pathology are currently pending. His mental status is disoriented.   Subjective: Patient reports doing well. He does not appropriately answer questions and is oriented to self only. Very talkative but not able to have meaningful responses.   Objective: Vitals:   05/29/23 1221 05/29/23 1849 05/29/23 1917 05/30/23 0441  BP: 124/68 132/63 119/68 134/83  Pulse: 69 69 71 67  Resp: 16  17 17   Temp: 97.7 F (36.5 C)  97.7 F (36.5 C) 98.2 F (36.8 C)  TempSrc: Oral  Oral   SpO2: 99%  100% 100%  Weight:      Height:        Intake/Output Summary (Last 24 hours) at 05/30/2023 0742 Last data filed at 05/30/2023 0300 Gross per 24 hour  Intake 1784.11 ml  Output 1150 ml  Net 634.11 ml   Filed Weights   05/25/23 1746 05/26/23 0002  Weight: 112 kg 103.1 kg     Exam:  General: Patient lying in bed. Confused and mildly agitated. Oriented to self Eyes: sclera anicteric, conjuctiva mild injection bilaterally CVS: S1-S2, regular  Respiratory: normal respiratory effort, CTAB GI: NABS, soft, NT  LE: Warm and well-perfused  Data Reviewed:  Basic Metabolic Panel: Recent Labs  Lab 05/25/23 1945 05/26/23 0414 05/28/23 0517  NA 137 141 140  K 5.4* 4.5 4.2  CL 104 110 112*  CO2 20* 20* 17*  GLUCOSE 212* 139* 86  BUN 34* 35* 25*  CREATININE 1.79* 1.34* 1.00  CALCIUM 9.7 9.0 8.9     CBC: Recent Labs  Lab 05/25/23 1945 05/26/23 0414 05/28/23 0517  WBC 6.1 5.5 5.3  NEUTROABS 3.6  --  2.8  HGB 13.6 12.3* 11.9*  HCT 43.2 38.9* 39.9  MCV 81.7 82.2 85.6  PLT 244 189 136*     Scheduled Meds:  allopurinol  300 mg Oral Daily   carvedilol  12.5 mg Oral BID WC   clopidogrel  75 mg Oral Daily   enoxaparin (LOVENOX) injection  40 mg Subcutaneous Daily   ezetimibe  10 mg Oral Daily   insulin aspart  0-20 Units Subcutaneous Q4H   levothyroxine  75 mcg Oral Q0600   rosuvastatin  40 mg Oral Daily   tamsulosin  0.4 mg Oral Daily   Continuous Infusions:  ampicillin (OMNIPEN) IV 2 g (05/30/23 0439)   cefTRIAXone (ROCEPHIN)  IV 2 g (05/30/23 0650)   vancomycin 750 mg (05/30/23 0300)     Assessment & Plan:  Altered mental status with confusion H/o Diffuse large B-cell lymphoma with possible reoccurrence  MRI brain on 05/26/2023 showed abnormal leptomeningeal enhancement, which could be infectious or lymphomatous  LP with 600 WBCs thought to be monocytes in CSF, No bacteria seen on CSF. Cytology and path pending.  - neurology, oncology following - palliative care consult pending results.  - anti-anxiety, agitation meds PRN  GPC in 1 out of  2 blood culture Ceftriaxone and vancomycin started empirically - stop vanc - follow cultures- staph haemolyticus  Presacral mass 3 x 5 cm soft tissue density in presacral area, phlegmon versus infiltrative mass Of note patient has recently been treated for orchitis and still has scrotal swelling Patient is afebrile and has no leukocytosis, UA is negative Patient underwent biopsy by IR, results pending Patient has been discussed with Dr. Leonides Schanz and oncology in case biopsy shows some urological cell cancer - follow up b/o - analgesia PRN  AKI Normalized with low-dose IVF  Scrotal edema Recent treatment for orchitis BPH Can consider urology consult either as inpatient or outpatient Continue tamsulosin  DM2 Continue  SSI  HFpEF HTN CAD Echo February 2024 shows EF 55 to 60% Blood pressure improved and normalized with IV fluids Increase carvedilol back to usual home dose of 12.5 twice daily Restart Imdur tomorrow if BP tolerates carvedilol well  H/oh CVA Continue Plavix  DVT prophylaxis: Lovenox Code Status: Full Family Communication: none at bedside  Studies: ECHOCARDIOGRAM COMPLETE Result Date: 05/28/2023    ECHOCARDIOGRAM REPORT   Patient Name:   Alex Cortez Date of Exam: 05/28/2023 Medical Rec #:  [id number]        Height:       75.0 in Accession #:    [id number]       Weight:       227.3 lb Date of Birth:  20-Mar-1950        BSA:          2.317 m Patient Age:    72 years         BP:           133/58 mmHg Patient Gender: M                HR:           62 bpm. Exam Location:  Inpatient Procedure: 2D Echo, 3D Echo, Cardiac Doppler, Color Doppler and Strain Analysis            (Both Spectral and Color Flow Doppler were utilized during            procedure). Indications:    Bacteremia  History:        Patient has prior history of Echocardiogram examinations, most                 recent 04/19/2022. Previous Myocardial Infarction and CAD, Stroke;                 Risk Factors:Sleep Apnea, Hypertension, Diabetes and                 Dyslipidemia.  Sonographer:    Vern Claude Referring Phys: [id number] BELINDA K KYERE IMPRESSIONS  1. Left ventricular ejection fraction, by estimation, is 55 to 60%. The left ventricle has normal function. The left ventricle has no regional wall motion abnormalities. Left ventricular diastolic parameters are consistent with Grade I diastolic dysfunction (impaired relaxation). The average left ventricular global longitudinal strain is -18.7 %. The global longitudinal strain is normal.  2. Right ventricular systolic function is normal. The right ventricular size is normal.  3. The mitral valve is normal in structure. No evidence of mitral valve regurgitation. No evidence of mitral stenosis.   4. The aortic valve is normal in structure. Aortic valve regurgitation is not visualized. No aortic stenosis is present.  5. The inferior vena cava is normal in size with greater than 50% respiratory variability, suggesting right  atrial pressure of 3 mmHg. Conclusion(s)/Recommendation(s): No evidence of valvular vegetations on this transthoracic echocardiogram. Consider a transesophageal echocardiogram to exclude infective endocarditis if clinically indicated. FINDINGS  Left Ventricle: Left ventricular ejection fraction, by estimation, is 55 to 60%. The left ventricle has normal function. The left ventricle has no regional wall motion abnormalities. The average left ventricular global longitudinal strain is -18.7 %. Strain was performed and the global longitudinal strain is normal. The left ventricular internal cavity size was normal in size. There is no left ventricular hypertrophy. Left ventricular diastolic parameters are consistent with Grade I diastolic dysfunction (impaired relaxation). Right Ventricle: The right ventricular size is normal. No increase in right ventricular wall thickness. Right ventricular systolic function is normal. Left Atrium: Left atrial size was normal in size. Right Atrium: Right atrial size was normal in size. Pericardium: There is no evidence of pericardial effusion. Presence of epicardial fat layer. Mitral Valve: The mitral valve is normal in structure. No evidence of mitral valve regurgitation. No evidence of mitral valve stenosis. MV peak gradient, 3.5 mmHg. The mean mitral valve gradient is 1.0 mmHg. Tricuspid Valve: The tricuspid valve is normal in structure. Tricuspid valve regurgitation is not demonstrated. No evidence of tricuspid stenosis. Aortic Valve: The aortic valve is normal in structure. Aortic valve regurgitation is not visualized. No aortic stenosis is present. Aortic valve mean gradient measures 7.0 mmHg. Aortic valve peak gradient measures 13.5 mmHg. Aortic valve  area, by VTI measures 1.78 cm. Pulmonic Valve: The pulmonic valve was normal in structure. Pulmonic valve regurgitation is not visualized. No evidence of pulmonic stenosis. Aorta: The aortic root is normal in size and structure. Venous: The inferior vena cava is normal in size with greater than 50% respiratory variability, suggesting right atrial pressure of 3 mmHg. IAS/Shunts: No atrial level shunt detected by color flow Doppler. Additional Comments: 3D was performed not requiring image post processing on an independent workstation and was normal.  LEFT VENTRICLE PLAX 2D LVIDd:         4.90 cm      Diastology LVIDs:         3.20 cm      LV e' medial:    5.98 cm/s LV PW:         1.00 cm      LV E/e' medial:  11.5 LV IVS:        0.90 cm      LV e' lateral:   8.05 cm/s LVOT diam:     2.30 cm      LV E/e' lateral: 8.6 LV SV:         76 LV SV Index:   33           2D Longitudinal Strain LVOT Area:     4.15 cm     2D Strain GLS Avg:     -18.7 %  LV Volumes (MOD) LV vol d, MOD A2C: 166.0 ml 3D Volume EF: LV vol d, MOD A4C: 181.0 ml 3D EF:        56 % LV vol s, MOD A2C: 68.4 ml  LV EDV:       263 ml LV vol s, MOD A4C: 74.1 ml  LV ESV:       116 ml LV SV MOD A2C:     97.6 ml  LV SV:        147 ml LV SV MOD A4C:     181.0 ml LV SV MOD BP:  98.8 ml RIGHT VENTRICLE RV Basal diam:  4.00 cm RV Mid diam:    2.10 cm RV S prime:     12.90 cm/s LEFT ATRIUM             Index        RIGHT ATRIUM           Index LA diam:        3.80 cm 1.64 cm/m   RA Area:     13.60 cm LA Vol (A2C):   50.5 ml 21.80 ml/m  RA Volume:   29.40 ml  12.69 ml/m LA Vol (A4C):   34.5 ml 14.89 ml/m LA Biplane Vol: 42.0 ml 18.13 ml/m  AORTIC VALVE                     PULMONIC VALVE AV Area (Vmax):    1.77 cm      PV Vmax:          0.79 m/s AV Area (Vmean):   1.47 cm      PV Peak grad:     2.5 mmHg AV Area (VTI):     1.78 cm      PR End Diast Vel: 3.69 msec AV Vmax:           184.00 cm/s AV Vmean:          131.000 cm/s AV VTI:            0.427 m AV  Peak Grad:      13.5 mmHg AV Mean Grad:      7.0 mmHg LVOT Vmax:         78.30 cm/s LVOT Vmean:        46.200 cm/s LVOT VTI:          0.183 m LVOT/AV VTI ratio: 0.43  AORTA Ao Root diam: 3.50 cm Ao Asc diam:  3.00 cm MITRAL VALVE MV Area (PHT): 2.42 cm    SHUNTS MV Area VTI:   2.28 cm    Systemic VTI:  0.18 m MV Peak grad:  3.5 mmHg    Systemic Diam: 2.30 cm MV Mean grad:  1.0 mmHg MV Vmax:       0.94 m/s MV Vmean:      58.0 cm/s MV Decel Time: 313 msec MV E velocity: 69.00 cm/s MV A velocity: 90.70 cm/s MV E/A ratio:  0.76 Kardie Tobb DO Electronically signed by Thomasene Ripple DO Signature Date/Time: 05/28/2023/12:57:40 PM    Final     Principal Problem:   AKI (acute kidney injury) (HCC) Active Problems:   Meningioma (HCC)   Pelvic mass   Type 2 diabetes mellitus with vascular disease (HCC)   History of CVA with residual deficit   Essential hypertension   DLBCL (diffuse large B cell lymphoma) (HCC)   Chronic diastolic CHF (congestive heart failure) (HCC)     Antavia Tandy Jannette Fogo, [facility]  If 7PM-7AM, please contact night-coverage [email or web address]   LOS: 3 days

## 2023-05-31 ENCOUNTER — Inpatient Hospital Stay (HOSPITAL_COMMUNITY)
Admit: 2023-05-31 | Discharge: 2023-05-31 | Disposition: A | Discharge status: Home / Self Care | Attending: Neurology | Admitting: Neurology

## 2023-05-31 DIAGNOSIS — N179 Acute kidney failure, unspecified: Secondary | ICD-10-CM | POA: Diagnosis not present

## 2023-05-31 DIAGNOSIS — N5089 Other specified disorders of the male genital organs: Secondary | ICD-10-CM

## 2023-05-31 DIAGNOSIS — R531 Weakness: Secondary | ICD-10-CM

## 2023-05-31 DIAGNOSIS — R410 Disorientation, unspecified: Secondary | ICD-10-CM | POA: Diagnosis not present

## 2023-05-31 LAB — CSF CULTURE W GRAM STAIN: Culture: NO GROWTH

## 2023-05-31 LAB — GLUCOSE, CAPILLARY
Glucose-Capillary: 121 mg/dL — ABNORMAL HIGH (ref 70–99)
Glucose-Capillary: 122 mg/dL — ABNORMAL HIGH (ref 70–99)
Glucose-Capillary: 134 mg/dL — ABNORMAL HIGH (ref 70–99)
Glucose-Capillary: 76 mg/dL (ref 70–99)
Glucose-Capillary: 78 mg/dL (ref 70–99)
Glucose-Capillary: 96 mg/dL (ref 70–99)

## 2023-05-31 LAB — TSH: TSH: 0.907 u[IU]/mL (ref 0.350–4.500)

## 2023-05-31 LAB — SURGICAL PATHOLOGY

## 2023-05-31 LAB — VITAMIN B12: Vitamin B-12: 599 pg/mL (ref 180–914)

## 2023-05-31 LAB — PATHOLOGIST SMEAR REVIEW

## 2023-05-31 LAB — FOLATE: Folate: 3.4 ng/mL — ABNORMAL LOW (ref >5.9)

## 2023-05-31 MED ORDER — SODIUM CHLORIDE 0.9 % IV SOLN
2.0000 g | INTRAVENOUS | Status: DC
  Administered 2023-06-01 – 2023-06-02 (×2): 2 g via INTRAVENOUS
  Filled 2023-05-31 (×2): qty 20

## 2023-05-31 MED ORDER — INSULIN ASPART 100 UNIT/ML IJ SOLN
0.0000 [IU] | Freq: Three times a day (TID) | INTRAMUSCULAR | Status: DC
  Administered 2023-05-31: 3 [IU] via SUBCUTANEOUS
  Administered 2023-06-01: 4 [IU] via SUBCUTANEOUS
  Administered 2023-06-02 – 2023-06-04 (×4): 3 [IU] via SUBCUTANEOUS

## 2023-05-31 MED ORDER — SODIUM CHLORIDE 0.9 % IV SOLN: INTRAVENOUS | Status: AC

## 2023-05-31 MED ORDER — IBUPROFEN 200 MG PO TABS
400.0000 mg | ORAL_TABLET | Freq: Three times a day (TID) | ORAL | Status: AC
  Administered 2023-05-31 – 2023-06-02 (×9): 400 mg via ORAL
  Filled 2023-05-31 (×9): qty 2

## 2023-05-31 NOTE — TOC Progression Note (Signed)
 Transition of Care [facility]) - Progression Note    Patient Details  Name: Alex Cortez MRN: [id number] Date of Birth: 09-Aug-1950  [facility] of [facility]) CM/SW Contact  Diona Browner, [place] Phone Number: 05/31/2023, 10:09 AM  Clinical Narrative:    CSW completed new FL2. SNF referrals sent out to additional SNFs. Pending bed offers to review with pt wife.    Expected Discharge Plan: Skilled Nursing Facility Barriers to Discharge: Continued Medical Work up  Expected Discharge Plan and Services In-house Referral: Clinical Social Work   Post Acute Care Choice: Durable Medical Equipment Living arrangements for the past 2 months: Single Family Home                                       Social Determinants of Health (SDOH) Interventions SDOH Screenings   Food Insecurity: Patient Unable To Answer (05/26/2023)  Housing: Low Risk  (05/26/2023)  Transportation Needs: Patient Unable To Answer (05/26/2023)  Utilities: Patient Unable To Answer (05/26/2023)  Depression (PHQ2-9): Low Risk  (04/07/2022)  Financial Resource Strain: Low Risk  (01/03/2023)  Physical Activity: Inactive (11/22/2022)  Social Connections: Unknown (05/26/2023)  Stress: No Stress Concern Present (04/07/2022)  Tobacco Use: Medium Risk (05/25/2023)  Health Literacy: Adequate Health Literacy (12/06/2022)    Readmission Risk Interventions    05/31/2023   10:09 AM 11/17/2022   10:57 AM 07/14/2022   12:04 PM  Readmission Risk Prevention Plan  Transportation Screening Complete Complete Complete  PCP or Specialist Appt within 5-7 Days Complete    Home Care Screening Complete    Medication Review (RN CM) Complete    HRI or Home Care Consult  Complete Complete  Social Work Consult for Recovery Care Planning/Counseling  Complete Complete  Palliative Care Screening  Not Applicable Not Applicable  Medication Review Oceanographer)  Complete Complete

## 2023-05-31 NOTE — Progress Notes (Addendum)
 PROGRESS NOTE SHMUEL GIRGIS  BMW:[id number]  DOB: 1951-03-07  DOA: 05/25/2023 PCP: Juliette Alcide, MD  Hospital course: LOFTON LEON is a 73 year old man with CAD, DM 2, HTN and H/oh CVA was admitted to [facility] for progressive weakness and decreasing ability to walk over the past week.  Workup revealed progressive pericolonic inflammatory stranding and a 3 x 6 soft tissue density within the presacral space which may be a phlegmon or an infiltrative mass. MRI brain showed 6 cm extra axial mass that is likely a meningioma.  Patient is transferred to [facility] for biopsy of presacral area and for LP. Final cytology and pathology are currently pending. His mental status is disoriented but cooperative. LP not significant for infection.  Med onc and neurology consulted.   Subjective: Patient reports scrotal tenderness. Improved since moving to recliner. He is otherwise unable to appropriately answer questions as he is disoriented. Asks me about a refrigerator behind me and other comments that didn't fit context of the conversation.   Objective: Vitals:   05/30/23 0807 05/30/23 1314 05/30/23 1927 05/31/23 0442  BP: 125/66 137/62 126/64 134/66  Pulse: 63 61 61 66  Resp:  17 19 18   Temp:  98.1 F (36.7 C) 97.6 F (36.4 C) 98.3 F (36.8 C)  TempSrc:  Oral  Axillary  SpO2:  99% 100% 99%  Weight:      Height:        Intake/Output Summary (Last 24 hours) at 05/31/2023 1216 Last data filed at 05/31/2023 0700 Gross per 24 hour  Intake 240 ml  Output --  Net 240 ml   Filed Weights   05/25/23 1746 05/26/23 0002  Weight: 112 kg 103.1 kg   Exam:  General: Patient sitting in recliner. Confused, not agitated. Oriented to self only Eyes: sclera anicteric, conjuctiva mild injection bilaterally CVS: S1-S2, regular  Respiratory: normal respiratory effort, CTAB GI: NABS, soft, NT  LE: Warm and well-perfused GU: significant edema of scrotum and penis. Non-tender to palpation. No  erythema or dermatitis appreciated.   Data Reviewed:  Basic Metabolic Panel: Recent Labs  Lab 05/25/23 1945 05/26/23 0414 05/28/23 0517  NA 137 141 140  K 5.4* 4.5 4.2  CL 104 110 112*  CO2 20* 20* 17*  GLUCOSE 212* 139* 86  BUN 34* 35* 25*  CREATININE 1.79* 1.34* 1.00  CALCIUM 9.7 9.0 8.9   CBC: Recent Labs  Lab 05/25/23 1945 05/26/23 0414 05/28/23 0517  WBC 6.1 5.5 5.3  NEUTROABS 3.6  --  2.8  HGB 13.6 12.3* 11.9*  HCT 43.2 38.9* 39.9  MCV 81.7 82.2 85.6  PLT 244 189 136*   Assessment & Plan: Altered mental status with confusion H/o Diffuse large B-cell lymphoma with possible reoccurrence  MRI brain on 05/26/2023 showed abnormal leptomeningeal enhancement, which could be infectious or lymphomatous  LP with 600 WBCs thought to be monocytes in CSF, No bacteria seen on CSF.  - Cytology and path pending.  - neurology, oncology following - palliative care consult pending results.  - anti-anxiety, agitation meds PRN - ampicillin discontinued.  - continue ceftriaxone  - haloperidol PRN for agitation  GPC in 1 out of 2 blood culture Ceftriaxone and vancomycin started empirically - stop vanc - follow cultures- staph haemolyticus. Remains on ceftriaxone - repeat blood cultures today  Presacral mass- 3 x 5 cm soft tissue density in presacral area, phlegmon versus infiltrative mass Patient is afebrile and has no leukocytosis, UA is negative Patient underwent biopsy by  IR, results pending Patient has been discussed with Dr. Leonides Schanz and oncology in case biopsy shows some urological cell cancer - follow up b/o - analgesia PRN Epididymitis- PTA. Seen on scrotal US prior to hospitalization. Mass likely interfering with drainage.  - Abx to treat for infectious sources covering typical agents - antiinflammatories scheduled x3 days - elevate - Continue tamsulosin  AKI Normalized with low-dose IVF  DM2 Continue SSI  HFpEF  HTN  CAD- Echo February 2024 shows EF 55 to  60% - continue home carvedilol 12.5 twice daily - holding home Imdur for low/normal BP - continue plavix, zetia, statin  H/oh CVA Continue Plavix  DVT prophylaxis: Lovenox Code Status: Full Family Communication: wife on phone  Studies: No results found.   Principal Problem:   AKI (acute kidney injury) (HCC) Active Problems:   Meningioma (HCC)   Pelvic mass   Type 2 diabetes mellitus with vascular disease (HCC)   History of CVA with residual deficit   Essential hypertension   DLBCL (diffuse large B cell lymphoma) (HCC)   Chronic diastolic CHF (congestive heart failure) (HCC)    Tyriq Moragne Jannette Fogo, [facility]  If 7PM-7AM, please contact night-coverage [email or web address]   LOS: 4 days

## 2023-05-31 NOTE — Consult Note (Signed)
 NEUROLOGY CONSULT NOTE   Date of service: May 31, 2023 Patient Name: Alex Cortez MRN:  [id number] DOB:  10/22/1950 Chief Complaint: "Confusion, leptomeningeal enhancement, possible left cerebellar stroke and meningioma" Requesting Provider: Leeroy Bock, MD  History of Present Illness  Alex Cortez is a 73 y.o. male with hx of left frontotemporal meningioma, CAD, DM2, HTN, history of stroke, DLBCL in remission who initially presented to [facility] ED with confusion.  I spoke with his wife, mother and sister at the bedside in detail.  They report that he had swelling of his scrotal sac that started in the first week of March.  He was initially started on Levaquin but experienced some diarrhea and abdominal pain and so therefore was switched over to doxycycline.  The swelling did not improve despite doxycycline.  They went into see his family doctor on 05/26/2023 and was directed to go to the ED for further evaluation workup.  Family reports that his confusion started while he was in the ER at [facility].  He was evaluated by Dr. Eber Hong who noted that patient had not gotten out of his bed for the last 24 hours and he was not making any urine and he was weak all over and confused and not answering questions appropriately.  Patient was admitted to [facility] ED and he had an MRI of the brain with and without contrast which was notable for abnormal leptomeningeal enhancement which could be possibly infectious or lymphomatous meningitis.  He was also noted to have a 13 mm area of enhancement in the inferior left cerebellum with the differential being a subacute infarct versus CNS lymphoma.  He was started on empiric meningitis coverage and underwent LP with notable lymphocytic pleocytosis, elevated CSF protein, normal CSF glucose.  Meningitis/encephalitis bio fire panel was negative, CSF culture so far with no growth over the last 3 days.  Pathologist  review of the smear and CSF flow cytometry is pending.  Over the last few days, despite antibiotics, patient has been confused.  His confusion is waxing and waning and his wakefulness improves at some times, but he has persistent cognitive and executive dysfunction.  Of note, patient does not have any nuchal rigidity on my evaluation, patient has not had a fever while he has been in the hospital or while he was at [facility] ED.  Patient does not elevated white cell count on his blood work.  Patient does not report a headache.   ROS  Unable to ascertain due to cognitive and executive dysfunction.  Past History   Past Medical History:  Diagnosis Date   CAD (coronary artery disease)    nonobstructive CAD/patent LAD stent site; EF 65%, 08/2011; NSTEMI/DES LAD, 6/20 11   Cerebral embolism with cerebral infarction 08/01/2017   Chest pain    CVA (cerebral vascular accident) (HCC) 08/01/2017   Diabetes mellitus    insulin-dependent   Dyslipidemia    HTN (hypertension)    Hypothyroidism    Left pontine stroke (HCC) 10/23/2012   Myocardial infarction (HCC) 2011   Post MI syndrome (HCC) 08/29/2009   Stroke (HCC) 10/22/2012   Left inferior pons    Past Surgical History:  Procedure Laterality Date   COLONOSCOPY  08/2021   CORONARY ANGIOPLASTY WITH STENT PLACEMENT  2011   IR IMAGING GUIDED PORT INSERTION  04/26/2022   IR REMOVAL TUN ACCESS W/ PORT W/O FL MOD SED  01/20/2023   LYMPH NODE BIOPSY Right 04/14/2022  Procedure: RIGHT INGUINAL EXCISIONAL LYMPH NODE BIOPSY;  Surgeon: Fritzi Mandes, MD;  Location: [facility] ORS;  Service: General;  Laterality: Right;    Family History: Family History  Problem Relation Age of Onset   Colon cancer Mother    Hypertension Other        in all family members    Social History  reports that he quit smoking about 35 years ago. His smoking use included cigarettes. He started smoking about 68 years ago. He has a 16.5 pack-year smoking history. He has never  used smokeless tobacco. He reports that he does not drink alcohol and does not use drugs.  Allergies  Allergen Reactions   Ace Inhibitors Swelling    Angioedema   Other Swelling    Shellfish   Swelling and hives  Shellfish  Swelling and hives   Rituxan [Rituximab] Other (See Comments)    Pt had sensitivity reaction to Rituxan. See progress note from 05/14/22. Pt able to complete infusion.    Shrimp [Shellfish Allergy] Hives and Swelling    Medications   Current Facility-Administered Medications:    acetaminophen (TYLENOL) tablet 650 mg, 650 mg, Oral, Q6H PRN, 650 mg at 05/30/23 1016 **OR** acetaminophen (TYLENOL) suppository 650 mg, 650 mg, Rectal, Q6H PRN, Mansy, Jan A, MD   allopurinol (ZYLOPRIM) tablet 300 mg, 300 mg, Oral, Daily, Mansy, Jan A, MD, 300 mg at 05/31/23 1100   carvedilol (COREG) tablet 12.5 mg, 12.5 mg, Oral, BID WC, Leandro Reasoner Tublu, MD, 12.5 mg at 05/31/23 0802   [START ON 06/01/2023] cefTRIAXone (ROCEPHIN) 2 g in sodium chloride 0.9 % 100 mL IVPB, 2 g, Intravenous, Q24H, Leeroy Bock, MD   clopidogrel (PLAVIX) tablet 75 mg, 75 mg, Oral, Daily, Luberta Robertson, Horatio Pel Tublu, MD, 75 mg at 05/31/23 1100   enoxaparin (LOVENOX) injection 40 mg, 40 mg, Subcutaneous, Daily, Danford Bad, RPH, 40 mg at 05/31/23 1100   ezetimibe (ZETIA) tablet 10 mg, 10 mg, Oral, Daily, Mansy, Jan A, MD, 10 mg at 05/31/23 1100   haloperidol (HALDOL) tablet 5 mg, 5 mg, Oral, Q8H PRN, 5 mg at 05/30/23 1016 **OR** haloperidol lactate (HALDOL) injection 5 mg, 5 mg, Intramuscular, Q8H PRN, Leandro Reasoner Tublu, MD, 5 mg at 05/27/23 2336   ibuprofen (ADVIL) tablet 400 mg, 400 mg, Oral, TID, Leeroy Bock, MD, 400 mg at 05/31/23 1100   insulin aspart (novoLOG) injection 0-20 Units, 0-20 Units, Subcutaneous, TID WC, Leeroy Bock, MD   levothyroxine (SYNTHROID) tablet 75 mcg, 75 mcg, Oral, Q0600, Mansy, Jan A, MD, 75 mcg at 05/31/23 0601   lidocaine-prilocaine (EMLA)  cream 1 Application, 1 Application, Topical, Daily PRN, Mansy, Jan A, MD   magnesium hydroxide (MILK OF MAGNESIA) suspension 30 mL, 30 mL, Oral, Daily PRN, Mansy, Jan A, MD   nitroGLYCERIN (NITROSTAT) SL tablet 0.4 mg, 0.4 mg, Sublingual, Q5 min PRN, Mansy, Jan A, MD   ondansetron (ZOFRAN) tablet 4 mg, 4 mg, Oral, Q6H PRN **OR** ondansetron (ZOFRAN) injection 4 mg, 4 mg, Intravenous, Q6H PRN, Mansy, Jan A, MD   rosuvastatin (CRESTOR) tablet 40 mg, 40 mg, Oral, Daily, Mansy, Jan A, MD, 40 mg at 05/31/23 1100   tamsulosin (FLOMAX) capsule 0.4 mg, 0.4 mg, Oral, Daily, Mansy, Jan A, MD, 0.4 mg at 05/31/23 1100   traZODone (DESYREL) tablet 25 mg, 25 mg, Oral, QHS PRN, Mansy, Jan A, MD, 25 mg at 05/29/23 2129  Vitals   Vitals:   05/30/23 1314 05/30/23 1927 05/31/23 0442 05/31/23 1243  BP: 137/62 126/64 134/66 (!) 112/59  Pulse: 61 61 66 66  Resp: 17 19 18 16   Temp: 98.1 F (36.7 C) 97.6 F (36.4 C) 98.3 F (36.8 C) 98.2 F (36.8 C)  TempSrc: Oral  Axillary Oral  SpO2: 99% 100% 99% 100%  Weight:      Height:        Body mass index is 28.41 kg/m.  Physical Exam   General: Laying comfortably in bed; in no acute distress.  HENT: Normal oropharynx and mucosa. Normal external appearance of ears and nose.  Neck: Supple, no pain or tenderness  CV: No JVD. No peripheral edema.  Pulmonary: Symmetric Chest rise. Normal respiratory effort.  Abdomen: Soft to touch, non-tender.  Ext: No cyanosis, edema, or deformity  Skin: No rash. Normal palpation of skin.   Musculoskeletal: Normal digits and nails by inspection. No clubbing.   Neurologic Examination  Mental status/Cognition: Awake, oriented to self, place, month but thinks it is 1965.  Poor attention.  Unable to do simple calculations and shows difficulty with reasoning.Marland Kitchen Speech/language: Hypophonic, fluent, comprehension intact, object naming intact, repetition intact. Cranial nerves:   CN II Pupils equal and reactive to light, no VF  deficits   CN III,IV,VI EOM intact, no gaze preference or deviation, no nystagmus    CN V normal sensation in V1, V2, and V3 segments bilaterally    CN VII no asymmetry, no nasolabial fold flattening    CN VIII normal hearing to speech    CN IX & X normal palatal elevation, no uvular deviation    CN XI 5/5 head turn and 5/5 shoulder shrug bilaterally    CN XII midline tongue protrusion    Motor:  Muscle bulk: Normal, tone normal, pronator drift none tremor none Mvmt Root Nerve  Muscle Right Left Comments  SA C5/6 Ax Deltoid 5 5   EF C5/6 Mc Biceps 5 5   EE C6/7/8 Rad Triceps 5 5   WF C6/7 Med FCR     WE C7/8 PIN ECU     F Ab C8/T1 U ADM/FDI 5 5   HF L1/2/3 Fem Illopsoas 5 5   KE L2/3/4 Fem Quad 5 5   DF L4/5 D Peron Tib Ant 5 5   PF S1/2 Tibial Grc/Sol 5 5    Sensation:  Light touch Intact throughout   Pin prick    Temperature    Vibration   Proprioception    Coordination/Complex Motor:  - Finger to Nose intact bilaterally - Heel to shin intact bilaterally - Rapid alternating movement are normal - Gait: Deferred for patient safety. Labs/Imaging/Neurodiagnostic studies   CBC:  Recent Labs  Lab 2023-06-15 1945 05/26/23 0414 05/28/23 0517  WBC 6.1 5.5 5.3  NEUTROABS 3.6  --  2.8  HGB 13.6 12.3* 11.9*  HCT 43.2 38.9* 39.9  MCV 81.7 82.2 85.6  PLT 244 189 136*   Basic Metabolic Panel:  Lab Results  Component Value Date   NA 140 05/28/2023   K 4.2 05/28/2023   CO2 17 (L) 05/28/2023   GLUCOSE 86 05/28/2023   BUN 25 (H) 05/28/2023   CREATININE 1.00 05/28/2023   CALCIUM 8.9 05/28/2023   GFRNONAA >60 05/28/2023   GFRAA 74 01/01/2020   Lipid Panel:  Lab Results  Component Value Date   LDLCALC 82 12/26/2021   HgbA1c:  Lab Results  Component Value Date   HGBA1C 8.3 (H) 2023-06-15   Urine Drug Screen: No results found for: "LABOPIA", "COCAINSCRNUR", "LABBENZ", "AMPHETMU", "  THCU", "LABBARB"  Alcohol Level No results found for: "ETH" INR  Lab Results   Component Value Date   INR 1.1 05/25/2023   APTT  Lab Results  Component Value Date   APTT <22 (L) 05/25/2023   AED levels: No results found for: "PHENYTOIN", "ZONISAMIDE", "LAMOTRIGINE", "LEVETIRACETA"  CT Head without contrast(Personally reviewed): 1. Negative for acute intracranial hemorrhage or midline shift. 2. Interval enlargement of left convexity slightly dense extra-axial mass, measures about 6.3 cm AP x 17 cm transverse, previously 41 x 14 mm. No surrounding hypodense edema. No midline shift. Findings previously characterized as probable meningioma 3. Interval mild ventricular enlargement compared to prior head CT but no significant progression of atrophy. Increased white matter hypodensity adjacent to the frontal and occipital horns, difficult to exclude normal pressure hydrocephalus with transependymal flow of CSF. Suggest further assessment with MRI. Left convexity extra-axial enlarged mass could also be evaluated at MRI follow-up. 4. Small chronic infarcts as described above. 5. Mucosal thickening in the sinuses with small fluid level right sphenoid sinus, correlate for acute sinusitis.  MRI Brain with and without contrast (Personally reviewed): 1. Abnormal leptomeningeal enhancement which could be infectious or lymphomatous meningitis. 2. 13 mm area of enhancement in the inferior left cerebellum has signal characteristics seen in both subacute infarct and CNS lymphoma. Consider CSF analysis. 3. Large meningioma along the left frontal convexity, slowly growing since at least 2019.  Neurodiagnostics rEEG:  Pending.  ASSESSMENT   MARCQUES WRIGHTSMAN is a 73 y.o. male with hx of left frontotemporal meningioma, CAD, DM2, HTN, history of stroke, DLBCL in remission who initially presented to [facility] ED with scrotal swelling that started in the first week of March with no improvement despite antibiotics and was noted to be very confused in the ED with  generalized weakness and had not gotten out of his bed over the last 24 hours.   Workup with MRI of the brain with and without contrast demonstrated abnormal leptomeningeal enhancement concerning for potential infectious versus lymphomatous meningitis.  He does have a history of DLBCL, in remission since August 2024.  He has not had fever at home or in the hospital, denies any headache, he does not have any nuchal rigidity has been on empiric meningitis coverage for the last 3 days.  His meningitis/encephalitis bio fire panel has been negative, CSF cultures with no growth over the last 3 days, he does not have elevated white cell count and his blood.  Given negative cultures, negative meningitis/encephalitis bio fire panel and absence of classic clinical signs of meningitis including negative Kernig's and Brudzinski's sign and no nuchal rigidity my overall suspicion for potential meningitis/encephalitis is low.  RECOMMENDATIONS  -CSF cytology and flow cytometry added onto collected CSF.  I am not sure if there is enough csf left to run these. -Pathologist review of CSF smear is still pending. -Empiric meningitis coverage with vancomycin, ceftriaxone, ampicillin. -Encephalopathy labs with vitamin B12, folate, TSH, thiamine levels, RPR, HIV.  My overall suspicion is low since his confusion seem to be a bit too acute for what I would expect from potential vitamin deficiencies. -Routine EEG ordered to evaluate for epileptic discharges or subclinical seizures. ______________________________________________________________________    Welton Flakes, MD [facility]

## 2023-05-31 NOTE — Plan of Care (Signed)
   Problem: Clinical Measurements: Goal: Will remain free from infection Outcome: Progressing Goal: Diagnostic test results will improve Outcome: Progressing

## 2023-05-31 NOTE — [place] FL2 (Signed)
 [place] MEDICAID FL2 LEVEL OF CARE FORM     IDENTIFICATION  Patient Name: Alex Cortez Birthdate: 1950-07-27 Sex: male Admission Date (Current Location): 05/25/2023  [facility] and [place] Number:  Producer, television/film/video and Address:  [facility],  [address] [place], [place] [address]      Provider Number: [id number]  Attending Physician Name and Address:  Leeroy Bock, MD  Relative Name and Phone Number:  Sensabaugh,[name] (Spouse)  [phone] (Mobile)    Current Level of Care: Hospital Recommended Level of Care: Skilled Nursing Facility Prior Approval Number:    Date Approved/Denied:   PASRR Number: [id number]  Discharge Plan: SNF    Current Diagnoses: Patient Active Problem List   Diagnosis Date Noted   Meningioma (HCC) 05/26/2023   Pelvic mass 05/26/2023   AKI (acute kidney injury) (HCC) 05/25/2023   Chronic diastolic CHF (congestive heart failure) (HCC) 11/17/2022   Coronary artery disease 11/17/2022   Insulin long-term use (HCC) 08/10/2022   Port-A-Cath in place 05/05/2022   DLBCL (diffuse large B cell lymphoma) (HCC) 04/23/2022   Hypothyroidism 01/12/2018   History of CVA with residual deficit    Essential hypertension    Type 2 diabetes mellitus with obesity (HCC)    Sedentary lifestyle 09/09/2015   Mixed hyperlipidemia 02/24/2015   Exertional dyspnea 10/18/2012   Diastolic heart failure (HCC) 10/01/2011   Obstructive sleep apnea 09/02/2011   LV dysfunction 09/02/2011   ERECTILE DYSFUNCTION, SECONDARY TO MEDICATION 04/14/2010   Type 2 diabetes mellitus with vascular disease (HCC) 10/02/2009   CORONARY ATHEROSCLEROSIS NATIVE CORONARY ARTERY 10/02/2009    Orientation RESPIRATION BLADDER Height & Weight     Self  Normal Incontinent Weight: 227 lb 4.7 oz (103.1 kg) Height:  6\' 3"  (190.5 cm)  BEHAVIORAL SYMPTOMS/MOOD NEUROLOGICAL BOWEL NUTRITION STATUS      Continent Diet (see d/c summary)  AMBULATORY STATUS COMMUNICATION OF NEEDS  Skin   Extensive Assist Verbally Skin abrasions                       Personal Care Assistance Level of Assistance  Bathing, Feeding, Dressing Bathing Assistance: Maximum assistance Feeding assistance: Limited assistance Dressing Assistance: Maximum assistance     Functional Limitations Info  Sight, Hearing, Speech Sight Info: Impaired Hearing Info: Adequate Speech Info: Adequate    SPECIAL CARE FACTORS FREQUENCY  PT (By licensed PT), OT (By licensed OT)     PT Frequency: 5x/week OT Frequency: 5x/week            Contractures Contractures Info: Not present    Additional Factors Info  Code Status, Allergies Code Status Info: Full Allergies Info: Ace Inhibitors  Other  Rituxan (Rituximab)  Shrimp (Shellfish Allergy)           Current Medications (05/31/2023):  This is the current hospital active medication list Current Facility-Administered Medications  Medication Dose Route Frequency Provider Last Rate Last Admin   acetaminophen (TYLENOL) tablet 650 mg  650 mg Oral Q6H PRN Mansy, Jan A, MD   650 mg at 05/30/23 1016   Or   acetaminophen (TYLENOL) suppository 650 mg  650 mg Rectal Q6H PRN Mansy, Jan A, MD       allopurinol (ZYLOPRIM) tablet 300 mg  300 mg Oral Daily Mansy, Jan A, MD   300 mg at 05/30/23 1007   carvedilol (COREG) tablet 12.5 mg  12.5 mg Oral BID WC Leandro Reasoner Tublu, MD   12.5 mg at 05/31/23 2951   [  START ON 06/01/2023] cefTRIAXone (ROCEPHIN) 2 g in sodium chloride 0.9 % 100 mL IVPB  2 g Intravenous Q24H Leeroy Bock, MD       clopidogrel (PLAVIX) tablet 75 mg  75 mg Oral Daily Leandro Reasoner Tublu, MD   75 mg at 05/30/23 1007   enoxaparin (LOVENOX) injection 40 mg  40 mg Subcutaneous Daily Danford Bad, RPH   40 mg at 05/30/23 1007   ezetimibe (ZETIA) tablet 10 mg  10 mg Oral Daily Mansy, Jan A, MD   10 mg at 05/30/23 1007   haloperidol (HALDOL) tablet 5 mg  5 mg Oral Q8H PRN Pieter Partridge, MD   5 mg at 05/30/23  1016   Or   haloperidol lactate (HALDOL) injection 5 mg  5 mg Intramuscular Q8H PRN Leandro Reasoner Tublu, MD   5 mg at 05/27/23 2336   ibuprofen (ADVIL) tablet 400 mg  400 mg Oral TID Leeroy Bock, MD       insulin aspart (novoLOG) injection 0-20 Units  0-20 Units Subcutaneous Q4H Carollee Herter, DO   3 Units at 05/30/23 1827   levothyroxine (SYNTHROID) tablet 75 mcg  75 mcg Oral Q0600 Mansy, Jan A, MD   75 mcg at 05/31/23 0601   lidocaine-prilocaine (EMLA) cream 1 Application  1 Application Topical Daily PRN Mansy, Jan A, MD       magnesium hydroxide (MILK OF MAGNESIA) suspension 30 mL  30 mL Oral Daily PRN Mansy, Jan A, MD       nitroGLYCERIN (NITROSTAT) SL tablet 0.4 mg  0.4 mg Sublingual Q5 min PRN Mansy, Jan A, MD       ondansetron [facility]) tablet 4 mg  4 mg Oral Q6H PRN Mansy, Jan A, MD       Or   ondansetron [facility]) injection 4 mg  4 mg Intravenous Q6H PRN Mansy, Jan A, MD       rosuvastatin (CRESTOR) tablet 40 mg  40 mg Oral Daily Mansy, Jan A, MD   40 mg at 05/30/23 1006   tamsulosin (FLOMAX) capsule 0.4 mg  0.4 mg Oral Daily Mansy, Jan A, MD   0.4 mg at 05/30/23 1006   traZODone (DESYREL) tablet 25 mg  25 mg Oral QHS PRN Mansy, Jan A, MD   25 mg at 05/29/23 2129     Discharge Medications: Please see discharge summary for a list of discharge medications.  Relevant Imaging Results:  Relevant Lab Results:   Additional Information SSN [id number]  Diona Browner, LCSW

## 2023-05-31 NOTE — Plan of Care (Signed)
  Problem: Clinical Measurements: Goal: Diagnostic test results will improve Outcome: Progressing   Problem: Coping: Goal: Level of anxiety will decrease Outcome: Progressing   Problem: Tissue Perfusion: Goal: Adequacy of tissue perfusion will improve Outcome: Progressing

## 2023-05-31 NOTE — Progress Notes (Deleted)
 Name: Alex Cortez DOB: 16-Jun-1950 MRN: [id number]  History of Present Illness: Mr. Foglio is a 73 y.o. male who presents today for follow up visit at [facility] [place].  ***He is accompanied by ***. - GU history: 1. Kidney stone(s). - 04/28/2023: CT chest/abdomen/pelvis w/ contrast showed no GU stones, masses, or hydronephrosis; prostate unremarkable. 2. Erectile dysfunction.   Recent history:  > 05/06/2023:  - Seen in ER for scrotal swelling.  - Scrotal / testicular ultrasound showed evidence of left epididymitis/orchitis. Also has small bilateral hydroceles.  - Received IM Rocephin. Discharged on Levaquin 750 mg daily x10 days.   > 05/12/2023: - Urology appointment. No improvement of bilateral scrotal and penile swelling on day 6 or 7 of Levaquin; discontinued that and started Bactrim DS twice daily x10 days as alternative.   > 05/18/2023: Bactrim discontinued due to side effects (upset stomach and impaired balance). Doxycycline ordered as alternative.   > 05/25/2023: Admitted ***current?  Today: He reports ***  He reports {DESC; RIGHT/LEFT/BILATERAL:23152} scrotal ***pain ***swelling; {Actions; denies-reports:120008} associated ***redness, ***warmth, ***tenderness, ***swelling.  The problem was first noticed *** and has been ***intermittent ***constant ***worsening ***improving.  He {Actions; denies-reports:120008} recent ***injury ***trauma to the area.   He {Actions; denies-reports:120008} acute urinary complaints. He {Actions; denies-reports:120008} fevers.  Medications: No current facility-administered medications for this visit.   No current outpatient medications on file.   Facility-Administered Medications Ordered in Other Visits  Medication Dose Route Frequency Provider Last Rate Last Admin   acetaminophen (TYLENOL) tablet 650 mg  650 mg Oral Q6H PRN Mansy, Jan A, MD   650 mg at 05/30/23 1016   Or   acetaminophen (TYLENOL) suppository 650 mg  650 mg  Rectal Q6H PRN Mansy, Jan A, MD       allopurinol (ZYLOPRIM) tablet 300 mg  300 mg Oral Daily Mansy, Jan A, MD   300 mg at 05/30/23 1007   carvedilol (COREG) tablet 12.5 mg  12.5 mg Oral BID WC Leandro Reasoner Tublu, MD   12.5 mg at 05/31/23 0802   cefTRIAXone (ROCEPHIN) 2 g in sodium chloride 0.9 % 100 mL IVPB  2 g Intravenous Q12H Poindexter, Leann T, RPH 200 mL/hr at 05/31/23 0801 2 g at 05/31/23 0801   clopidogrel (PLAVIX) tablet 75 mg  75 mg Oral Daily Leandro Reasoner Tublu, MD   75 mg at 05/30/23 1007   enoxaparin (LOVENOX) injection 40 mg  40 mg Subcutaneous Daily Danford Bad, RPH   40 mg at 05/30/23 1007   ezetimibe (ZETIA) tablet 10 mg  10 mg Oral Daily Mansy, Jan A, MD   10 mg at 05/30/23 1007   haloperidol (HALDOL) tablet 5 mg  5 mg Oral Q8H PRN Leandro Reasoner Tublu, MD   5 mg at 05/30/23 1016   Or   haloperidol lactate (HALDOL) injection 5 mg  5 mg Intramuscular Q8H PRN Leandro Reasoner Tublu, MD   5 mg at 05/27/23 2336   insulin aspart (novoLOG) injection 0-20 Units  0-20 Units Subcutaneous Q4H Carollee Herter, DO   3 Units at 05/30/23 1827   levothyroxine (SYNTHROID) tablet 75 mcg  75 mcg Oral Q0600 Mansy, Jan A, MD   75 mcg at 05/31/23 0601   lidocaine-prilocaine (EMLA) cream 1 Application  1 Application Topical Daily PRN Mansy, Jan A, MD       magnesium hydroxide (MILK OF MAGNESIA) suspension 30 mL  30 mL Oral Daily PRN Mansy, Vernetta Honey, MD  nitroGLYCERIN (NITROSTAT) SL tablet 0.4 mg  0.4 mg Sublingual Q5 min PRN Mansy, Jan A, MD       ondansetron [facility]) tablet 4 mg  4 mg Oral Q6H PRN Mansy, Jan A, MD       Or   ondansetron [facility]) injection 4 mg  4 mg Intravenous Q6H PRN Mansy, Jan A, MD       rosuvastatin (CRESTOR) tablet 40 mg  40 mg Oral Daily Mansy, Jan A, MD   40 mg at 05/30/23 1006   tamsulosin (FLOMAX) capsule 0.4 mg  0.4 mg Oral Daily Mansy, Jan A, MD   0.4 mg at 05/30/23 1006   traZODone (DESYREL) tablet 25 mg  25 mg Oral QHS PRN Mansy, Jan A, MD   25  mg at 05/29/23 2129    Allergies: Allergies  Allergen Reactions   Ace Inhibitors Swelling    Angioedema   Other Swelling    Shellfish   Swelling and hives  Shellfish  Swelling and hives   Rituxan [Rituximab] Other (See Comments)    Pt had sensitivity reaction to Rituxan. See progress note from 05/14/22. Pt able to complete infusion.    Shrimp [Shellfish Allergy] Hives and Swelling    Past Medical History:  Diagnosis Date   CAD (coronary artery disease)    nonobstructive CAD/patent LAD stent site; EF 65%, 08/2011; NSTEMI/DES LAD, 6/20 11   Cerebral embolism with cerebral infarction 08/01/2017   Chest pain    CVA (cerebral vascular accident) (HCC) 08/01/2017   Diabetes mellitus    insulin-dependent   Dyslipidemia    HTN (hypertension)    Hypothyroidism    Left pontine stroke (HCC) 10/23/2012   Myocardial infarction (HCC) 2011   Post MI syndrome (HCC) 08/29/2009   Stroke (HCC) 10/22/2012   Left inferior pons   Past Surgical History:  Procedure Laterality Date   COLONOSCOPY  08/2021   CORONARY ANGIOPLASTY WITH STENT PLACEMENT  2011   IR IMAGING GUIDED PORT INSERTION  04/26/2022   IR REMOVAL TUN ACCESS W/ PORT W/O FL MOD SED  01/20/2023   LYMPH NODE BIOPSY Right 04/14/2022   Procedure: RIGHT INGUINAL EXCISIONAL LYMPH NODE BIOPSY;  Surgeon: Fritzi Mandes, MD;  Location: [facility];  Service: General;  Laterality: Right;   Family History  Problem Relation Age of Onset   Colon cancer Mother    Hypertension Other        in all family members   Social History   Socioeconomic History   Marital status: Married    Spouse name: Not on file   Number of children: Not on file   Years of education: Not on file   Highest education level: Not on file  Occupational History   Not on file  Tobacco Use   Smoking status: Former    Current packs/day: 0.00    Average packs/day: 0.5 packs/day for 33.0 years (16.5 ttl pk-yrs)    Types: Cigarettes    Start date: 03/16/1955    Quit date:  03/15/1988    Years since quitting: 35.2   Smokeless tobacco: Never  Vaping Use   Vaping status: Never Used  Substance and Sexual Activity   Alcohol use: No    Alcohol/week: 0.0 standard drinks of alcohol   Drug use: No   Sexual activity: Not on file  Other Topics Concern   Not on file  Social History Narrative   Works at [employer]. Married.    Social Drivers of Corporate investment banker  Strain: Low Risk  (01/03/2023)   Overall Financial Resource Strain (CARDIA)    Difficulty of Paying Living Expenses: Not very hard  Food Insecurity: Patient Unable To Answer (05/26/2023)   Hunger Vital Sign    Worried About Running Out of Food in the Last Year: Patient unable to answer    Ran Out of Food in the Last Year: Patient unable to answer  Transportation Needs: Patient Unable To Answer (05/26/2023)   PRAPARE - Transportation    Lack of Transportation (Medical): Patient unable to answer    Lack of Transportation (Non-Medical): Patient unable to answer  Physical Activity: Inactive (11/22/2022)   Exercise Vital Sign    Days of Exercise per Week: 0 days    Minutes of Exercise per Session: 0 min  Stress: No Stress Concern Present (04/07/2022)   [employer] of Occupational Health - Occupational Stress Questionnaire    Feeling of Stress : Not at all  Social Connections: Unknown (05/26/2023)   Social Connection and Isolation Panel [NHANES]    Frequency of Communication with Friends and Family: Patient unable to answer    Frequency of Social Gatherings with Friends and Family: Patient unable to answer    Attends Religious Services: Patient unable to answer    Active Member of Clubs or Organizations: Not on file    Attends Banker Meetings: Patient unable to answer    Marital Status: Patient unable to answer  Intimate Partner Violence: Patient Unable To Answer (05/26/2023)   Humiliation, Afraid, Rape, and Kick questionnaire    Fear of Current or Ex-Partner: Patient unable to  answer    Emotionally Abused: Patient unable to answer    Physically Abused: Patient unable to answer    Sexually Abused: Patient unable to answer    Review of Systems Constitutional: Patient denies any unintentional weight loss or change in strength lntegumentary: Patient denies any rashes or pruritus Cardiovascular: Patient denies chest pain or syncope Respiratory: Patient denies shortness of breath Gastrointestinal: ***Patient {Actions; denies-reports:120008} ***nausea, ***vomiting, ***constipation, ***diarrhea ***As per HPI Musculoskeletal: Patient denies muscle cramps or weakness Neurologic: Patient denies convulsions or seizures Allergic/Immunologic: Patient denies recent allergic reaction(s) Hematologic/Lymphatic: Patient denies bleeding tendencies Endocrine: Patient denies heat/cold intolerance  GU: As per HPI.  OBJECTIVE There were no vitals filed for this visit. There is no height or weight on file to calculate BMI.  Physical Examination Constitutional: No obvious distress; patient is non-toxic appearing  Cardiovascular: No visible lower extremity edema.  Respiratory: The patient does not have audible wheezing/stridor; respirations do not appear labored  Gastrointestinal: Abdomen non-distended Musculoskeletal: Normal ROM of UEs  Skin: No obvious rashes/open sores  Neurologic: CN 2-12 grossly intact Psychiatric: Answered questions appropriately with normal affect  Hematologic/Lymphatic/Immunologic: No obvious bruises or sites of spontaneous bleeding  UA:  ***positive for *** leukocytes, *** blood, ***nitrites ***Urine microscopy:  ***negative  *** WBC/hpf, *** RBC/hpf, *** bacteria ***with no evidence of UTI ***with no evidence of microscopic hematuria ***otherwise unremarkable ***glucosuria (secondary to ***Jardiance ***Farxiga use)  PVR: *** ml  ASSESSMENT No diagnosis found. ***  We agreed to plan for follow up in *** months / ***1 year or sooner if  needed. Patient verbalized understanding of and agreement with current plan. All questions were answered.  PLAN Advised the following: 1. *** 2. ***No follow-ups on file.  No orders of the defined types were placed in this encounter.   It has been explained that the patient is to follow regularly with their PCP in addition  to all other providers involved in their care and to follow instructions provided by these respective offices. Patient advised to contact urology clinic if any urologic-pertaining questions, concerns, new symptoms or problems arise in the interim period.  There are no Patient Instructions on file for this visit.  Electronically signed by:  Donnita Falls, FNP   05/31/23    8:16 AM

## 2023-05-31 NOTE — Progress Notes (Signed)
 Occupational Therapy Treatment Patient Details Name: Alex Cortez MRN: [id number] DOB: [birth year] Today's Date: 05/31/2023   History of present illness Alex Cortez is a 73 y.o. male admitted to [facility]  , who presented to ED with [facility] of lower abdominal pain with associated N/V. W/u revealed progressive pericolonic inflammatory stranding and a 3 x 6 soft tissue density within the presacral space which may be a phlegmon or an infiltrative mass. MRI brain showed 6 cm extra axial mass that is likely a meningioma.  Patient is transferred to [facility] for biopsy of presacral area and for LP. Final cytology and pathology are currently pending PMH:  DM2, dyslipidemia, essential hypertension, CVA and CAD   OT comments  Patient seen for skilled OT session this am. Patient was able to participate in all activity presented however mobility to commode and chair limited by scrotal pain and edema as well as generalized weakness, poor motor planning and balance, decreased activity tolerance and cognitive deficits. Patient requires + 2 assist for standing with RW for transfers and overall max A for LB ADL's with increased time and effort with cues. Patient requires ongoing Acute OT services to maximize function. Patient will benefit from continued inpatient follow up therapy, <3 hours/day       If plan is discharge home, recommend the following:  A lot of help with walking and/or transfers;A lot of help with bathing/dressing/bathroom;Assistance with cooking/housework;Assist for transportation;Help with stairs or ramp for entrance;Direct supervision/assist for medications management   Equipment Recommendations  Other (comment) (TBA)       Precautions / Restrictions Precautions Precautions: Fall Recall of Precautions/Restrictions: Impaired Precaution/Restrictions Comments: confusion, scrotal edema Restrictions Weight Bearing Restrictions Per Provider Order: No       Mobility Bed  Mobility Overal bed mobility: Needs Assistance Bed Mobility: Supine to Sit     Supine to sit: Mod assist, Max assist     General bed mobility comments: required increased assist this session.  Delayed motor control and posterior LOB with difficulty self correction.    Transfers Overall transfer level: Needs assistance Equipment used: Rolling walker (2 wheels) Transfers: Sit to/from Stand Sit to Stand: Max assist, +2 physical assistance, +2 safety/equipment           General transfer comment: distracted by scrotal edema with increased assistance for sit to stand, weight shift, motor planning and upright standing     Balance Overall balance assessment: Needs assistance Sitting-balance support: Single extremity supported Sitting balance-Leahy Scale: Fair Sitting balance - Comments: seated at EOB   Standing balance support: Bilateral upper extremity supported, During functional activity Standing balance-Leahy Scale: Poor Standing balance comment: Use of RW and +2 assist this am                           ADL either performed or assessed with clinical judgement   ADL Overall ADL's : Needs assistance/impaired Eating/Feeding: Set up;Sitting   Grooming: Set up;Sitting Grooming Details (indicate cue type and reason): cues for basic attention and sequencing Upper Body Bathing: Set up;Sitting   Lower Body Bathing: Maximal assistance;Sitting/lateral leans   Upper Body Dressing : Contact guard assist;Sitting   Lower Body Dressing: Maximal assistance;Sitting/lateral leans   Toilet Transfer: +2 for physical assistance;+2 for safety/equipment;Maximal assistance;Requires wide/bariatric (ue to scrotal edema)   Toileting- Clothing Manipulation and Hygiene: Moderate assistance;Sitting/lateral lean;Maximal assistance       Functional mobility during ADLs: +2 for physical assistance;+2 for safety/equipment;Maximal assistance General  ADL Comments: significant assist with  mobility this sesison    Extremity/Trunk Assessment Upper Extremity Assessment Upper Extremity Assessment: Generalized weakness (dysmetria)   Lower Extremity Assessment Lower Extremity Assessment: Generalized weakness        Vision   Vision Assessment?: Wears glasses for reading Additional Comments: maintaines eyes closed unless spoke to but denies any issues with vision, able to read clock and signs on wall   Perception Perception Perception: Not tested   Praxis Praxis Praxis: Impaired Praxis Impairment Details: Motor planning;Initiation   Communication Communication Communication: No apparent difficulties   Cognition Arousal: Alert   Cognition: Cognition impaired   Orientation impairments: Person Awareness: Intellectual awareness impaired, Online awareness impaired Memory impairment (select all impairments): Short-term memory Attention impairment (select first level of impairment): Sustained attention Executive functioning impairment (select all impairments): Initiation, Organization, Sequencing, Reasoning, Problem solving OT - Cognition Comments: Cues for simple sequencing and recall of simple safety steps                 Following commands: Impaired Following commands impaired: Follows one step commands inconsistently      Cueing   Cueing Techniques: Verbal cues, Tactile cues        General Comments scrotal edema with OT placing in elevation and ice applied, nursing aware    Pertinent Vitals/ Pain       Pain Assessment Pain Assessment: Faces Faces Pain Scale: Hurts little more Breathing: normal Negative Vocalization: none Facial Expression: smiling or inexpressive Body Language: relaxed Consolability: no need to console PAINAD Score: 0 Pain Location:  (scrotal pain) Pain Descriptors / Indicators: Burning, Pressure, Sore Pain Intervention(s): Monitored during session, Repositioned, Relaxation, Ice applied   Frequency  Min 2X/week         Progress Toward Goals  OT Goals(current goals can now be found in the care plan section)  Progress towards OT goals: Progressing toward goals  Acute Rehab OT Goals Patient Stated Goal: to feel better OT Goal Formulation: With patient Time For Goal Achievement: 06/09/23 Potential to Achieve Goals: Good ADL Goals Pt Will Perform Grooming: with modified independence Pt Will Perform Upper Body Dressing: with modified independence Pt Will Perform Lower Body Dressing: with modified independence;sitting/lateral leans Pt Will Transfer to Toilet: with modified independence;ambulating Pt Will Perform Toileting - Clothing Manipulation and hygiene: with modified independence;sitting/lateral leans   AM-PAC OT "6 Clicks" Daily Activity     Outcome Measure   Help from another person eating meals?: A Little Help from another person taking care of personal grooming?: A Little Help from another person toileting, which includes using toliet, bedpan, or urinal?: A Lot Help from another person bathing (including washing, rinsing, drying)?: A Lot Help from another person to put on and taking off regular upper body clothing?: A Little Help from another person to put on and taking off regular lower body clothing?: A Lot 6 Click Score: 15    End of Session Equipment Utilized During Treatment: Gait belt;Rolling walker (2 wheels)  OT Visit Diagnosis: Unsteadiness on feet (R26.81);Other abnormalities of gait and mobility (R26.89);Muscle weakness (generalized) (M62.81);History of falling (Z91.81);Other symptoms and signs involving cognitive function   Activity Tolerance Patient limited by pain;Patient limited by fatigue   Patient Left in chair;with call bell/phone within reach;with chair alarm set;with family/visitor present   Nurse Communication Mobility status;Need for lift equipment;Other (comment) (scrotal edema with ice applied and may need STEDY for back to bed)        Time: 4098-1191 OT Time  Calculation (  min): 45 min  Charges: OT General Charges $OT Visit: 1 Visit OT Treatments $Self Care/Home Management : 38-52 mins  Dontrae Morini OT/L Acute Rehabilitation Department  [phone]   Vicenta Dunning 05/31/2023, 11:59 AM

## 2023-05-31 NOTE — Progress Notes (Signed)
 EEG complete - results pending

## 2023-06-01 ENCOUNTER — Ambulatory Visit: Payer: Medicare Other | Admitting: Urology

## 2023-06-01 DIAGNOSIS — R4182 Altered mental status, unspecified: Secondary | ICD-10-CM | POA: Diagnosis not present

## 2023-06-01 DIAGNOSIS — R569 Unspecified convulsions: Secondary | ICD-10-CM | POA: Diagnosis not present

## 2023-06-01 DIAGNOSIS — Z7189 Other specified counseling: Secondary | ICD-10-CM

## 2023-06-01 DIAGNOSIS — N5089 Other specified disorders of the male genital organs: Secondary | ICD-10-CM | POA: Diagnosis not present

## 2023-06-01 DIAGNOSIS — N179 Acute kidney failure, unspecified: Secondary | ICD-10-CM | POA: Diagnosis not present

## 2023-06-01 DIAGNOSIS — Z515 Encounter for palliative care: Secondary | ICD-10-CM | POA: Diagnosis not present

## 2023-06-01 DIAGNOSIS — R531 Weakness: Secondary | ICD-10-CM | POA: Diagnosis not present

## 2023-06-01 LAB — COMPREHENSIVE METABOLIC PANEL
ALT: 38 U/L (ref 0–44)
AST: 36 U/L (ref 15–41)
Albumin: 2.6 g/dL — ABNORMAL LOW (ref 3.5–5.0)
Alkaline Phosphatase: 41 U/L (ref 38–126)
Anion gap: 12 (ref 5–15)
BUN: 24 mg/dL — ABNORMAL HIGH (ref 8–23)
CO2: 15 mmol/L — ABNORMAL LOW (ref 22–32)
Calcium: 8.6 mg/dL — ABNORMAL LOW (ref 8.9–10.3)
Chloride: 112 mmol/L — ABNORMAL HIGH (ref 98–111)
Creatinine, Ser: 1.2 mg/dL (ref 0.61–1.24)
GFR, Estimated: >60 mL/min (ref >60)
Glucose, Bld: 82 mg/dL (ref 70–99)
Potassium: 3.7 mmol/L (ref 3.5–5.1)
Sodium: 139 mmol/L (ref 135–145)
Total Bilirubin: 0.7 mg/dL (ref 0.0–1.2)
Total Protein: 5.7 g/dL — ABNORMAL LOW (ref 6.5–8.1)

## 2023-06-01 LAB — CBC
HCT: 37.4 % — ABNORMAL LOW (ref 39.0–52.0)
Hemoglobin: 11.5 g/dL — ABNORMAL LOW (ref 13.0–17.0)
MCH: 26 pg (ref 26.0–34.0)
MCHC: 30.7 g/dL (ref 30.0–36.0)
MCV: 84.6 fL (ref 80.0–100.0)
Platelets: 122 10*3/uL — ABNORMAL LOW (ref 150–400)
RBC: 4.42 MIL/uL (ref 4.22–5.81)
RDW: 14.7 % (ref 11.5–15.5)
WBC: 4.3 10*3/uL (ref 4.0–10.5)
nRBC: 0 % (ref 0.0–0.2)

## 2023-06-01 LAB — RPR: RPR Ser Ql: NONREACTIVE

## 2023-06-01 LAB — HIV ANTIBODY (ROUTINE TESTING W REFLEX): HIV Screen 4th Generation wRfx: NONREACTIVE

## 2023-06-01 LAB — GLUCOSE, CAPILLARY
Glucose-Capillary: 98 mg/dL (ref 70–99)
Glucose-Capillary: 173 mg/dL — ABNORMAL HIGH (ref 70–99)
Glucose-Capillary: 73 mg/dL (ref 70–99)

## 2023-06-01 LAB — FLOW CYTOMETRY REQUEST - FLUID (INPATIENT)

## 2023-06-01 MED ORDER — FOLIC ACID 1 MG PO TABS
1.0000 mg | ORAL_TABLET | Freq: Every day | ORAL | Status: DC
  Administered 2023-06-01 – 2023-06-05 (×5): 1 mg via ORAL
  Filled 2023-06-01 (×7): qty 1

## 2023-06-01 NOTE — Progress Notes (Signed)
 PROGRESS NOTE    Alex Cortez  NWG:[id number] DOB: 01-Jun-1950 DOA: 05/25/2023 PCP: Juliette Alcide, MD  Chief Complaint  Patient presents with   Weakness    Hospital Course:  Alex Cortez is 73 y.o. male with B cell lymphoma CAD, type 2 diabetes, hypertension, history of CVA, history of frontotemporal meningioma, initially admitted to [facility] for progressive weakness and decreasing ability to walk over the week prior. Workup revealed progressive pericolonic inflammatory stranding and a 3 x 6 soft tissue density within the presacral space.  MRI brain revealed a 6 cm extra-axial mass likely meningioma.  Patient was transferred to [facility] for biopsy of presacral area. His confusion became progressively worse.  Brain MRI was performed which was notable for abnormal leptomeningeal enhancement which could reveal infectious or lymphomatous meningitis.  He was also noted to have a 13 mm area of enhancement in the inferior left cerebellum differential including subacute infarct for CNS lymphoma.  Neurology was consulted.  He was initiated on empiric meningitis coverage and underwent LP with notable lymphocytic pleocytosis, elevated CSF protein, normal CSF glucose.  CSF culture and bio fire panel was negative, and antibiotics were de-escalated.  His confusion has persisted despite antibiotics.  Subjective: On evaluation today patient's mother and sister at bedside.  We discussed his pathology findings.  They have many questions for the oncology team. The patient is intermittently confused.  He accurately reports that he is in the hospital but is disoriented to year, time, and appears to be referring to places and situations that are not actively occurring.  Objective: Vitals:   05/31/23 2117 06/01/23 0506 06/01/23 0811 06/01/23 0829  BP: (!) 140/75 (!) 141/70 (!) 131/59 (!) 131/59  Pulse: 66 70 66 66  Resp: 18 18 18    Temp: 98 F (36.7 C) 98.1 F (36.7 C)    TempSrc: Oral     SpO2:  98% 100% 100%   Weight:      Height:        Intake/Output Summary (Last 24 hours) at 06/01/2023 0837 Last data filed at 06/01/2023 0730 Gross per 24 hour  Intake 1158.1 ml  Output 0 ml  Net 1158.1 ml   Filed Weights   05/25/23 1746 05/26/23 0002  Weight: 112 kg 103.1 kg    Examination: General exam: Appears calm and comfortable, NAD  Respiratory system: No work of breathing, symmetric chest wall expansion Cardiovascular system: S1 & S2 heard, RRR.  Gastrointestinal system: Abdomen is nondistended, soft and nontender.  Neuro: Oriented to self and situation, disoriented to year, date, events leading up to this hospitalization.  Intermittently confused. Extremities: Symmetric, expected ROM Skin: No rashes, lesions Psychiatry: Calm, redirectable.  Assessment & Plan:  Principal Problem:   AKI (acute kidney injury) (HCC) Active Problems:   Meningioma (HCC)   Pelvic mass   Type 2 diabetes mellitus with vascular disease (HCC)   History of CVA with residual deficit   Essential hypertension   DLBCL (diffuse large B cell lymphoma) (HCC)   Chronic diastolic CHF (congestive heart failure) (HCC)  Diffuse large B-cell lymphoma with recurrence - MRI brain 3/13: Abnormal leptomeningeal enhancement which could be infectious or lymphomatous - Lumbar puncture 3/14 path + B Cell lymphoma  - Oncology consulted and following.  Oncology has discussed directly with [facility] lymphoma team and unfortunately there is no further intervention.  Recommending palliative care/hospice. - Palliative consulted. Family deferring final decisions until able to discuss with oncology   Acute encephalopathy - Initially  presumed to be infectious, CSF studies have been negative. Likely 2/2 to CNS Lymphoma - Was initially started on ampicillin, has since been discontinued - Continue ceftriaxone -- LP path consistent with lymphoma - Neurology consulted - MRI brain with multiple abnormalities: Abnormal  leptomeningeal enhancement which could be infectious or lymphomatous - Workup broadened with B12, folate, TSH, thiamine, RPR, HIV.  Studies have been largely within normal limits.  Folate low.  Thiamine pending.  Initiate folic acid supplementation. - EEG: Suggestive of diffuse encephalopathy.  No seizures or epileptiform discharges  Presacral mass - 3 x 5 cm soft tissue density in presacral area status post biopsy with interventional radiology - Results: Consistent with B-cell lymphoproliferative disorder. - Oncology is following  Epididymitis Scrotal edema - Prior to arrival. Seen on scrotal ultrasound prior to hospitalization.  Mass is likely interfering with drainage - Antibiotics treating for infectious sources as above - Continue anti-inflammatory, elevation, tamsulosin - Monitor for retention.  Currently voiding without issue  AKI - Resolved with IV fluids  Staph hemolyticus bacteremia - 1 out of 2 blood cultures positive - Ceftriaxone and vancomycin started empirically.  Now just on ceftriaxone - Repeated blood cultures 3/18, follow. - Echocardiogram without evidence of valvular vegetations  Heart failure with preserved EF CAD Hypertension - Echocardiogram 3/15: LVEF 55 to 60%, no regional wall motion abnormalities, grade 1 diastolic dysfunction. - As needed Lasix, Clinically euvolemic at this time - Continue Coreg - Continue Plavix, Zetia, statin - GDMT as BP tolerates  History of CVA - Resume home meds including Plavix.  Type 2 diabetes - Continue sliding scale insulin, titrate up as tolerated  BMI 28 -- Outpatient follow up for lifestyle modification and risk factor management   DVT prophylaxis: Lovenox   Code Status: Full Code Family Communication:  Discussed directly with patient, his mom and sister Disposition:  Inpatient, still hospitalized for oncology and palliative evals, will discharge to home vs hospice when family has decided definite  goals.  Consultants:  Treatment Team:  Consulting Physician: Jaci Standard, MD  Procedures:    Antimicrobials:  Anti-infectives (From admission, onward)    Start     Dose/Rate Route Frequency Ordered Stop   06/01/23 1000  cefTRIAXone (ROCEPHIN) 2 g in sodium chloride 0.9 % 100 mL IVPB        2 g 200 mL/hr over 30 Minutes Intravenous Every 24 hours 05/31/23 0956     05/29/23 1500  vancomycin (VANCOREADY) IVPB 750 mg/150 mL  Status:  Discontinued        750 mg 150 mL/hr over 60 Minutes Intravenous Every 12 hours 05/29/23 1019 05/30/23 1248   05/28/23 1500  vancomycin (VANCOCIN) IVPB 1000 mg/200 mL premix  Status:  Discontinued        1,000 mg 200 mL/hr over 60 Minutes Intravenous Every 12 hours 05/28/23 0157 05/28/23 1137   05/28/23 1500  vancomycin (VANCOREADY) IVPB 1250 mg/250 mL  Status:  Discontinued        1,250 mg 166.7 mL/hr over 90 Minutes Intravenous Every 12 hours 05/28/23 1137 05/29/23 1019   05/28/23 0400  cefTRIAXone (ROCEPHIN) 2 g in sodium chloride 0.9 % 100 mL IVPB  Status:  Discontinued        2 g 200 mL/hr over 30 Minutes Intravenous Every 12 hours 05/28/23 0308 05/31/23 0956   05/28/23 0400  ampicillin (OMNIPEN) 2 g in sodium chloride 0.9 % 100 mL IVPB  Status:  Discontinued        2 g  300 mL/hr over 20 Minutes Intravenous Every 4 hours 05/28/23 0309 05/31/23 0728   05/28/23 0245  vancomycin (VANCOREADY) IVPB 2000 mg/400 mL        2,000 mg 200 mL/hr over 120 Minutes Intravenous  Once 05/28/23 0155 05/28/23 0446   05/26/23 1800  cefTRIAXone (ROCEPHIN) 2 g in sodium chloride 0.9 % 100 mL IVPB  Status:  Discontinued        2 g 200 mL/hr over 30 Minutes Intravenous Every 24 hours 05/26/23 0350 05/26/23 1354   05/26/23 0445  cefTRIAXone (ROCEPHIN) 2 g in sodium chloride 0.9 % 100 mL IVPB  Status:  Discontinued        2 g 200 mL/hr over 30 Minutes Intravenous Every 24 hours 05/26/23 0351 05/26/23 0406   05/25/23 1830  cefTRIAXone (ROCEPHIN) 2 g in sodium  chloride 0.9 % 100 mL IVPB        2 g 200 mL/hr over 30 Minutes Intravenous Once 05/25/23 1822 05/25/23 1932       Data Reviewed: I have personally reviewed following labs and imaging studies CBC: Recent Labs  Lab 05/25/23 1945 05/26/23 0414 05/28/23 0517 06/01/23 0436  WBC 6.1 5.5 5.3 4.3  NEUTROABS 3.6  --  2.8  --   HGB 13.6 12.3* 11.9* 11.5*  HCT 43.2 38.9* 39.9 37.4*  MCV 81.7 82.2 85.6 84.6  PLT 244 189 136* 122*   Basic Metabolic Panel: Recent Labs  Lab 05/25/23 1945 05/26/23 0414 05/28/23 0517 06/01/23 0436  NA 137 141 140 139  K 5.4* 4.5 4.2 3.7  CL 104 110 112* 112*  CO2 20* 20* 17* 15*  GLUCOSE 212* 139* 86 82  BUN 34* 35* 25* 24*  CREATININE 1.79* 1.34* 1.00 1.20  CALCIUM 9.7 9.0 8.9 8.6*   GFR: Estimated Creatinine Clearance: 71.3 mL/min (by C-G formula based on SCr of 1.2 mg/dL). Liver Function Tests: Recent Labs  Lab 05/25/23 1945 06/01/23 0436  AST 20 36  ALT 28 38  ALKPHOS 61 41  BILITOT 1.4* 0.7  PROT 8.1 5.7*  ALBUMIN 3.9 2.6*   CBG: Recent Labs  Lab 05/31/23 0744 05/31/23 1226 05/31/23 1634 05/31/23 2040 06/01/23 0727  GLUCAP 76 134* 122* 121* 98    Recent Results (from the past 240 hours)  Resp panel by RT-PCR (RSV, Flu A&B, Covid) Anterior Nasal Swab     Status: None   Collection Time: 05/25/23  6:30 PM   Specimen: Anterior Nasal Swab  Result Value Ref Range Status   SARS Coronavirus 2 by RT PCR NEGATIVE NEGATIVE Final    Comment: (NOTE) SARS-CoV-2 target nucleic acids are NOT DETECTED.  The SARS-CoV-2 RNA is generally detectable in upper respiratory specimens during the acute phase of infection. The lowest concentration of SARS-CoV-2 viral copies this assay can detect is 138 copies/mL. A negative result does not preclude SARS-Cov-2 infection and should not be used as the sole basis for treatment or other patient management decisions. A negative result may occur with  improper specimen collection/handling, submission  of specimen other than nasopharyngeal swab, presence of viral mutation(s) within the areas targeted by this assay, and inadequate number of viral copies(<138 copies/mL). A negative result must be combined with clinical observations, patient history, and epidemiological information. The expected result is Negative.  Fact Sheet for Patients:  BloggerCourse.com  Fact Sheet for Healthcare Providers:  SeriousBroker.it  This test is no t yet approved or cleared by the [place] FDA and  has been authorized for detection and/or diagnosis  of SARS-CoV-2 by FDA under an Emergency Use Authorization (EUA). This EUA will remain  in effect (meaning this test can be used) for the duration of the COVID-19 declaration under Section 564(b)(1) of the Act, 21 U.S.C.section 360bbb-3(b)(1), unless the authorization is terminated  or revoked sooner.       Influenza A by PCR NEGATIVE NEGATIVE Final   Influenza B by PCR NEGATIVE NEGATIVE Final    Comment: (NOTE) The Xpert Xpress SARS-CoV-2/FLU/RSV plus assay is intended as an aid in the diagnosis of influenza from Nasopharyngeal swab specimens and should not be used as a sole basis for treatment. Nasal washings and aspirates are unacceptable for Xpert Xpress SARS-CoV-2/FLU/RSV testing.  Fact Sheet for Patients: BloggerCourse.com  Fact Sheet for Healthcare Providers: SeriousBroker.it  This test is not yet approved or cleared by the [place] FDA and has been authorized for detection and/or diagnosis of SARS-CoV-2 by FDA under an Emergency Use Authorization (EUA). This EUA will remain in effect (meaning this test can be used) for the duration of the COVID-19 declaration under Section 564(b)(1) of the Act, 21 U.S.C. section 360bbb-3(b)(1), unless the authorization is terminated or revoked.     Resp Syncytial Virus by PCR NEGATIVE NEGATIVE  Final    Comment: (NOTE) Fact Sheet for Patients: BloggerCourse.com  Fact Sheet for Healthcare Providers: SeriousBroker.it  This test is not yet approved or cleared by the [place] FDA and has been authorized for detection and/or diagnosis of SARS-CoV-2 by FDA under an Emergency Use Authorization (EUA). This EUA will remain in effect (meaning this test can be used) for the duration of the COVID-19 declaration under Section 564(b)(1) of the Act, 21 U.S.C. section 360bbb-3(b)(1), unless the authorization is terminated or revoked.  Performed at [facility], [address] [place], [place] [address]   Blood Culture (routine x 2)     Status: None   Collection Time: 05/25/23  7:56 PM   Specimen: BLOOD  Result Value Ref Range Status   Specimen Description BLOOD BLOOD RIGHT HAND  Final   Special Requests   Final    BOTTLES DRAWN AEROBIC AND ANAEROBIC Blood Culture results may not be optimal due to an inadequate volume of blood received in culture bottles   Culture   Final    NO GROWTH 5 DAYS Performed at [facility], [address], [place], [place] [address]    Report Status 05/30/2023 FINAL  Final  Blood Culture (routine x 2)     Status: Abnormal   Collection Time: 05/25/23  7:59 PM   Specimen: Left Antecubital; Blood  Result Value Ref Range Status   Specimen Description   Final    LEFT ANTECUBITAL BOTTLES DRAWN AEROBIC AND ANAEROBIC Performed at [facility], [address] [place], [place] [address]    Special Requests   Final    Blood Culture results may not be optimal due to an inadequate volume of blood received in culture bottles Performed at [facility], [address], [place], [place] [address]    Culture  Setup Time   Final    GRAM POSITIVE COCCI Gram Stain Report Called to,Read Back By and Verified With: BROWN,K @ 0112 ON 05/28/23 BY JUW AEROBIC BOTTLE ONLY GS DONE @ [facility] CRITICAL RESULT CALLED TO, READ BACK BY  AND VERIFIED WITH: M BELL,PHARMD@0737  05/28/23 MK    Culture (A)  Final    STAPHYLOCOCCUS HAEMOLYTICUS THE SIGNIFICANCE OF ISOLATING THIS ORGANISM FROM A SINGLE SET OF BLOOD CULTURES WHEN MULTIPLE SETS ARE DRAWN  IS UNCERTAIN. PLEASE NOTIFY THE MICROBIOLOGY DEPARTMENT WITHIN ONE WEEK IF SPECIATION AND SENSITIVITIES ARE REQUIRED. Performed at [facility], [address] [place], [place] [address]    Report Status 05/29/2023 FINAL  Final  Blood Culture ID Panel (Reflexed)     Status: Abnormal   Collection Time: 05/25/23  7:59 PM  Result Value Ref Range Status   Enterococcus faecalis NOT DETECTED NOT DETECTED Final   Enterococcus Faecium NOT DETECTED NOT DETECTED Final   Listeria monocytogenes NOT DETECTED NOT DETECTED Final   Staphylococcus species DETECTED (A) NOT DETECTED Final    Comment: CRITICAL RESULT CALLED TO, READ BACK BY AND VERIFIED WITH: M BELL,PHARMD@0737  05/28/23 MK    Staphylococcus aureus (BCID) NOT DETECTED NOT DETECTED Final   Staphylococcus epidermidis NOT DETECTED NOT DETECTED Final   Staphylococcus lugdunensis NOT DETECTED NOT DETECTED Final   Streptococcus species NOT DETECTED NOT DETECTED Final   Streptococcus agalactiae NOT DETECTED NOT DETECTED Final   Streptococcus pneumoniae NOT DETECTED NOT DETECTED Final   Streptococcus pyogenes NOT DETECTED NOT DETECTED Final   A.calcoaceticus-baumannii NOT DETECTED NOT DETECTED Final   Bacteroides fragilis NOT DETECTED NOT DETECTED Final   Enterobacterales NOT DETECTED NOT DETECTED Final   Enterobacter cloacae complex NOT DETECTED NOT DETECTED Final   Escherichia coli NOT DETECTED NOT DETECTED Final   Klebsiella aerogenes NOT DETECTED NOT DETECTED Final   Klebsiella oxytoca NOT DETECTED NOT DETECTED Final   Klebsiella pneumoniae NOT DETECTED NOT DETECTED Final   Proteus species NOT DETECTED NOT DETECTED Final   Salmonella species NOT DETECTED NOT DETECTED Final   Serratia marcescens NOT DETECTED NOT DETECTED  Final   Haemophilus influenzae NOT DETECTED NOT DETECTED Final   Neisseria meningitidis NOT DETECTED NOT DETECTED Final   Pseudomonas aeruginosa NOT DETECTED NOT DETECTED Final   Stenotrophomonas maltophilia NOT DETECTED NOT DETECTED Final   Candida albicans NOT DETECTED NOT DETECTED Final   Candida auris NOT DETECTED NOT DETECTED Final   Candida glabrata NOT DETECTED NOT DETECTED Final   Candida krusei NOT DETECTED NOT DETECTED Final   Candida parapsilosis NOT DETECTED NOT DETECTED Final   Candida tropicalis NOT DETECTED NOT DETECTED Final   Cryptococcus neoformans/gattii NOT DETECTED NOT DETECTED Final    Comment: Performed at [facility], [address], [place], [place] [address]  CSF culture w Gram Stain     Status: None   Collection Time: 05/27/23  4:10 PM   Specimen: CSF  Result Value Ref Range Status   Specimen Description   Final    CSF LP Performed at [facility], [address], [place], [place] [address]    Special Requests   Final    Immunocompromised Performed at [facility], [address], [place], [place] [address]    Gram Stain   Final    WBC PRESENT, PREDOMINANTLY MONONUCLEAR NO ORGANISMS SEEN CYTOSPIN SMEAR Gram Stain Report Called to,Read Back By and Verified With: Cloria Spring RN Performed at [facility], [address] [place], [place] [address]    Culture   Final    NO GROWTH 3 DAYS Performed at [facility], [address] [place], [place] [address]    Report Status 05/31/2023 FINAL  Final  Culture, blood (Routine X 2) w Reflex to ID Panel     Status: None (Preliminary result)   Collection Time: 05/31/23  6:11 PM   Specimen: BLOOD  Result Value Ref Range Status   Specimen  Description   Final    BLOOD BLOOD LEFT HAND Performed at [facility], [address], [place], [place] [address]    Special Requests   Final    BOTTLES DRAWN AEROBIC ONLY Blood Culture  results may not be optimal due to an inadequate volume of blood received in culture bottles Performed at [facility], [address] [place], [place] [address]    Culture   Final    NO GROWTH < 12 HOURS Performed at [facility], [address], [place], [place] [address]    Report Status PENDING  Incomplete  Culture, blood (Routine X 2) w Reflex to ID Panel     Status: None (Preliminary result)   Collection Time: 05/31/23  9:03 PM   Specimen: BLOOD  Result Value Ref Range Status   Specimen Description   Final    BLOOD BLOOD RIGHT HAND Performed at [facility], [address], [place], [place] [address]    Special Requests   Final    BOTTLES DRAWN AEROBIC AND ANAEROBIC Blood Culture results may not be optimal due to an inadequate volume of blood received in culture bottles Performed at [facility], [address] [place], [place] [address]    Culture   Final    NO GROWTH < 12 HOURS Performed at [facility], [address] [place], [place] [address]    Report Status PENDING  Incomplete     Radiology Studies: No results found.  Scheduled Meds:  allopurinol  300 mg Oral Daily   carvedilol  12.5 mg Oral BID WC   clopidogrel  75 mg Oral Daily   enoxaparin (LOVENOX) injection  40 mg Subcutaneous Daily   ezetimibe  10 mg Oral Daily   ibuprofen  400 mg Oral TID   insulin aspart  0-20 Units Subcutaneous TID WC   levothyroxine  75 mcg Oral Q0600   rosuvastatin  40 mg Oral Daily   tamsulosin  0.4 mg Oral Daily   Continuous Infusions:  cefTRIAXone (ROCEPHIN)  IV       LOS: 5 days  MDM: Patient is high risk for one or more organ failure.  They necessitate ongoing hospitalization for continued IV therapies and subsequent lab monitoring. Total time spent interpreting labs and vitals, coordinating care amongst consultants and care team members, directly assessing and discussing care with the patient and/or  family: 55 min    Debarah Crape, DO [facility]  To contact the attending physician between 7A-7P please use Epic Chat. To contact the covering physician during after hours 7P-7A, please review Amion.   06/01/2023, 8:37 AM   *This document has been created with the assistance of dictation software. Please excuse typographical errors. *

## 2023-06-01 NOTE — Progress Notes (Signed)
 NEUROLOGY CONSULT FOLLOW UP NOTE   Date of service: June 01, 2023 Patient Name: Alex Cortez MRN:  [id number] DOB:  Nov 14, 1950  Interval Hx/subjective   Continues to be confused. No focal deficit. Has executive and cognitive dysfunction.  Vitals   Vitals:   06/01/23 0506 06/01/23 0811 06/01/23 0829 06/01/23 0941  BP: (!) 141/70 (!) 131/59 (!) 131/59 (!) 158/74  Pulse: 70 66 66 75  Resp: 18 18  19   Temp: 98.1 F (36.7 C)     TempSrc:      SpO2: 100% 100%  100%  Weight:      Height:         Body mass index is 28.41 kg/m.  Physical Exam   General: Laying comfortably in bed; in no acute distress.  HENT: Normal oropharynx and mucosa. Normal external appearance of ears and nose.  Neck: Supple, no pain or tenderness  CV: No JVD. No peripheral edema.  Pulmonary: Symmetric Chest rise. Normal respiratory effort.  Abdomen: Soft to touch, non-tender.  Ext: No cyanosis, edema, or deformity  Skin: No rash. Normal palpation of skin.   Musculoskeletal: Normal digits and nails by inspection. No clubbing.    Neurologic Examination  Mental status/Cognition: Awake, oriented to self, place, thinks it is June of 2026.  Poor attention. Difficulty with reasoning.Marland Kitchen Speech/language: Hypophonic, fluent, comprehension intact, object naming intact, repetition intact. Cranial nerves:   CN II Pupils equal and reactive to light, no VF deficits   CN III,IV,VI EOM intact, no gaze preference or deviation, no nystagmus    CN V normal sensation in V1, V2, and V3 segments bilaterally    CN VII no asymmetry, no nasolabial fold flattening    CN VIII normal hearing to speech    CN IX & X normal palatal elevation, no uvular deviation    CN XI 5/5 head turn and 5/5 shoulder shrug bilaterally    CN XII midline tongue protrusion     Motor:  Muscle bulk: Normal, tone normal, pronator drift none tremor none Mvmt Root Nerve  Muscle Right Left Comments  SA C5/6 Ax Deltoid 5 5    EF C5/6 Mc Biceps 5 5     EE C6/7/8 Rad Triceps 5 5    WF C6/7 Med FCR        WE C7/8 PIN ECU        F Ab C8/T1 U ADM/FDI 5 5    HF L1/2/3 Fem Illopsoas 5 5    KE L2/3/4 Fem Quad 5 5    DF L4/5 D Peron Tib Ant 5 5    PF S1/2 Tibial Grc/Sol 5 5      Sensation:  Light touch Intact throughout   Pin prick     Temperature     Vibration    Proprioception      Coordination/Complex Motor:  - Finger to Nose intact bilaterally - Heel to shin intact bilaterally - Rapid alternating movement are normal - Gait: Deferred for patient safety.  Medications  Current Facility-Administered Medications:    acetaminophen (TYLENOL) tablet 650 mg, 650 mg, Oral, Q6H PRN, 650 mg at 05/30/23 1016 **OR** acetaminophen (TYLENOL) suppository 650 mg, 650 mg, Rectal, Q6H PRN, Mansy, Jan A, MD   allopurinol (ZYLOPRIM) tablet 300 mg, 300 mg, Oral, Daily, Mansy, Jan A, MD, 300 mg at 06/01/23 7829   carvedilol (COREG) tablet 12.5 mg, 12.5 mg, Oral, BID WC, Leandro Reasoner Tublu, MD, 12.5 mg at 06/01/23 0829   cefTRIAXone (ROCEPHIN) 2 g  in sodium chloride 0.9 % 100 mL IVPB, 2 g, Intravenous, Q24H, Leeroy Bock, MD, Last Rate: 200 mL/hr at 06/01/23 0938, 2 g at 06/01/23 1610   clopidogrel (PLAVIX) tablet 75 mg, 75 mg, Oral, Daily, Leandro Reasoner Tublu, MD, 75 mg at 06/01/23 0923   enoxaparin (LOVENOX) injection 40 mg, 40 mg, Subcutaneous, Daily, Danford Bad, RPH, 40 mg at 06/01/23 9604   ezetimibe (ZETIA) tablet 10 mg, 10 mg, Oral, Daily, Mansy, Jan A, MD, 10 mg at 06/01/23 5409   haloperidol (HALDOL) tablet 5 mg, 5 mg, Oral, Q8H PRN, 5 mg at 05/30/23 1016 **OR** haloperidol lactate (HALDOL) injection 5 mg, 5 mg, Intramuscular, Q8H PRN, Leandro Reasoner Tublu, MD, 5 mg at 05/31/23 2009   ibuprofen (ADVIL) tablet 400 mg, 400 mg, Oral, TID, Leeroy Bock, MD, 400 mg at 06/01/23 8119   insulin aspart (novoLOG) injection 0-20 Units, 0-20 Units, Subcutaneous, TID WC, Leeroy Bock, MD, 3 Units at 05/31/23 1705    levothyroxine (SYNTHROID) tablet 75 mcg, 75 mcg, Oral, Q0600, Mansy, Jan A, MD, 75 mcg at 06/01/23 1478   lidocaine-prilocaine (EMLA) cream 1 Application, 1 Application, Topical, Daily PRN, Mansy, Jan A, MD   magnesium hydroxide (MILK OF MAGNESIA) suspension 30 mL, 30 mL, Oral, Daily PRN, Mansy, Jan A, MD   nitroGLYCERIN (NITROSTAT) SL tablet 0.4 mg, 0.4 mg, Sublingual, Q5 min PRN, Mansy, Jan A, MD   ondansetron (ZOFRAN) tablet 4 mg, 4 mg, Oral, Q6H PRN **OR** ondansetron (ZOFRAN) injection 4 mg, 4 mg, Intravenous, Q6H PRN, Mansy, Jan A, MD   rosuvastatin (CRESTOR) tablet 40 mg, 40 mg, Oral, Daily, Mansy, Jan A, MD, 40 mg at 06/01/23 2956   tamsulosin [facility]) capsule 0.4 mg, 0.4 mg, Oral, Daily, Mansy, Jan A, MD, 0.4 mg at 06/01/23 2130   traZODone (DESYREL) tablet 25 mg, 25 mg, Oral, QHS PRN, Mansy, Jan A, MD, 25 mg at 05/31/23 2215  Labs and Diagnostic Imaging   CBC:  Recent Labs  Lab 05/25/23 1945 05/26/23 0414 05/28/23 0517 06/01/23 0436  WBC 6.1   < > 5.3 4.3  NEUTROABS 3.6  --  2.8  --   HGB 13.6   < > 11.9* 11.5*  HCT 43.2   < > 39.9 37.4*  MCV 81.7   < > 85.6 84.6  PLT 244   < > 136* 122*   < > = values in this interval not displayed.    Basic Metabolic Panel:  Lab Results  Component Value Date   NA 139 06/01/2023   K 3.7 06/01/2023   CO2 15 (L) 06/01/2023   GLUCOSE 82 06/01/2023   BUN 24 (H) 06/01/2023   CREATININE 1.20 06/01/2023   CALCIUM 8.6 (L) 06/01/2023   GFRNONAA >60 06/01/2023   GFRAA 74 01/01/2020   Lipid Panel:  Lab Results  Component Value Date   LDLCALC 82 12/26/2021   HgbA1c:  Lab Results  Component Value Date   HGBA1C 8.3 (H) 05/25/2023   Urine Drug Screen: No results found for: "LABOPIA", "COCAINSCRNUR", "LABBENZ", "AMPHETMU", "THCU", "LABBARB"  Alcohol Level No results found for: "ETH" INR  Lab Results  Component Value Date   INR 1.1 05/25/2023   APTT  Lab Results  Component Value Date   APTT <22 (L) 05/25/2023   AED levels: No  results found for: "PHENYTOIN", "ZONISAMIDE", "LAMOTRIGINE", "LEVETIRACETA"  No new imaging to review  rEEG:  This study is suggestive of mild diffuse encephalopathy. No seizures or epileptiform discharges were seen throughout  the recording.   Flow Cytometry results: Flow cytometric analysis shows a major abnormal B-cell population  expressing CD10, CD19 and CD38 with possible dim expression of surface  kappa light chain.  No significant CD20 expression is identified likely  due to prior treatment.  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    Assessment   ARSENIY TOOMEY is a 73 y.o. male with hx of left frontotemporal meningioma, CAD, DM2, HTN, history of stroke, DLBCL in remission who initially presented to [facility] ED with scrotal swelling that started in the first week of March with no improvement despite antibiotics and was noted to be very confused in the ED with generalized weakness and had not gotten out of his bed over the last 24 hours.    Workup with MRI of the brain with and without contrast demonstrated abnormal leptomeningeal enhancement concerning for potential infectious versus lymphomatous meningitis.  He does have a history of DLBCL, in remission since August 2024.   He has not had fever at home or in the hospital, denies any headache, he does not have any nuchal rigidity has been on empiric meningitis coverage for the last 3 days.  His meningitis/encephalitis bio fire panel has been negative, CSF cultures with no growth over the last 4 days now, he does not have elevated white cell count and his blood. Given negative cultures, negative meningitis/encephalitis bio fire panel and absence of classic clinical signs of meningitis including negative Kernig's and Brudzinski's sign and no nuchal rigidity my overall suspicion for potential meningitis/encephalitis is low.  Pathology review of CSF smear with atypical  lymphocytes.  Recommendations  - low suspicion for meningitis, CSF studies consistent with B-cell lymphoproliferative disorder. Routine EEG with no seizures. At this time, we will signoff. Please feel free to contact us with any questions or concerns. ______________________________________________________________________   Welton Flakes, MD [facility]

## 2023-06-01 NOTE — Plan of Care (Signed)

## 2023-06-01 NOTE — Procedures (Signed)
 Patient Name: Alex Cortez  MRN: [id number]  Epilepsy Attending: Charlsie Quest  Referring Physician/Provider: Erick Blinks, MD  Date: 05/31/2023 Duration: 24.51 mins  Patient history: 72yo M with ams. EEG to evaluate for seizure  Level of alertness: Awake, aslepe  AEDs during EEG study: None  Technical aspects: This EEG study was done with scalp electrodes positioned according to the 10-20 International system of electrode placement. Electrical activity was reviewed with band pass filter of 1-70Hz , sensitivity of 7 uV/mm, display speed of 59mm/sec with a 60Hz  notched filter applied as appropriate. EEG data were recorded continuously and digitally stored.  Video monitoring was available and reviewed as appropriate.  Description: The posterior dominant rhythm consists of 8-9 Hz activity of moderate voltage (25-35 uV) seen predominantly in posterior head regions, symmetric and reactive to eye opening and eye closing. Sleep was characterized by vertex waves, sleep spindles (12 to 14 Hz), maximal frontocentral region. EEG showed intermittent generalized 3 to 6 Hz theta-delta slowing. Hyperventilation and photic stimulation were not performed.     ABNORMALITY - Intermittent slow, generalized  IMPRESSION: This study is suggestive of mild diffuse encephalopathy. No seizures or epileptiform discharges were seen throughout the recording.  Treylan Mcclintock Annabelle Harman

## 2023-06-01 NOTE — Consult Note (Signed)
 Consultation Note Date: 06/01/2023   Patient Name: Alex Cortez  DOB: 05-21-1950  MRN: [id number]  Age / Sex: 73 y.o., male  PCP: Leandrew Koyanagi Ananias Pilgrim, MD Referring Physician: Debarah Crape, DO  Reason for Consultation: Establishing goals of care  HPI/Patient Profile: 73 y.o. male  admitted on 05/25/2023    Clinical Assessment and Goals of Care: 73 year old gentleman lives at home with his wife, left frontal temporal meningioma coronary artery disease diabetes hypertension history of stroke, DLBCL in remission.  Patient initially presented to [facility] with scrotal swelling and with generalized weakness.  MRI of the brain showing abnormal leptomeningeal enhancement concerning for potential infectious versus lymphomatous meningitis.  Patient admitted to hospital medicine service empirically placed on antibiotics, neurology also involved, CSF smear showing atypical lymphocytes. Palliative consult for CODE STATUS and goals of care discussions has been requested. Chart reviewed, patient seen and examined, wife present at bedside another family member present at bedside. Palliative medicine is specialized medical care for people living with serious illness. It focuses on providing relief from the symptoms and stress of a serious illness. The goal is to improve quality of life for both the patient and the family. Goals of care: Broad aims of medical therapy in relation to the patient's values and preferences. Our aim is to provide medical care aimed at enabling patients to achieve the goals that matter most to them, given the circumstances of their particular medical situation and their constraints.    NEXT OF KIN Wife present at bedside, another family member present at bedside.  Originally from [place], [place].  SUMMARY OF RECOMMENDATIONS   Goals of care discussions undertaken with the patient's  family present at bedside.  Remains full code full scope for now.  Family states that they are in the process of understanding his current workup and remain hopeful that he will be presented with some treatment options.  Additionally, they are asking if medical oncology is also going to formally weigh in and what they would recommend.  Explained to the best of my ability about the serious nature of the patient's condition and about the high possibility that the patient might continue to have decline or decompensation. Palliative will follow along, thank you for the consult. Code Status/Advance Care Planning: Full code   Symptom Management:     Palliative Prophylaxis:  Delirium Protocol  Additional Recommendations (Limitations, Scope, Preferences): Full Scope Treatment  Psycho-social/Spiritual:  Desire for further Chaplaincy support:yes Additional Recommendations: Caregiving  Support/Resources  Prognosis:  Unable to determine  Discharge Planning: To Be Determined      Primary Diagnoses: Present on Admission:  AKI (acute kidney injury) (HCC)  Essential hypertension  DLBCL (diffuse large B cell lymphoma) (HCC)  Chronic diastolic CHF (congestive heart failure) (HCC)  Type 2 diabetes mellitus with vascular disease (HCC)   I have reviewed the medical record, interviewed the patient and family, and examined the patient. The following aspects are pertinent.  Past Medical History:  Diagnosis Date   CAD (coronary artery disease)  nonobstructive CAD/patent LAD stent site; EF 65%, 08/2011; NSTEMI/DES LAD, 6/20 11   Cerebral embolism with cerebral infarction 08/01/2017   Chest pain    CVA (cerebral vascular accident) (HCC) 08/01/2017   Diabetes mellitus    insulin-dependent   Dyslipidemia    HTN (hypertension)    Hypothyroidism    Left pontine stroke (HCC) 10/23/2012   Myocardial infarction [facility]) 2011   Post MI syndrome (HCC) 08/29/2009   Stroke (HCC) 10/22/2012   Left  inferior pons   Social History   Socioeconomic History   Marital status: Married    Spouse name: Not on file   Number of children: Not on file   Years of education: Not on file   Highest education level: Not on file  Occupational History   Not on file  Tobacco Use   Smoking status: Former    Current packs/day: 0.00    Average packs/day: 0.5 packs/day for 33.0 years (16.5 ttl pk-yrs)    Types: Cigarettes    Start date: 03/16/1955    Quit date: 03/15/1988    Years since quitting: 35.2   Smokeless tobacco: Never  Vaping Use   Vaping status: Never Used  Substance and Sexual Activity   Alcohol use: No    Alcohol/week: 0.0 standard drinks of alcohol   Drug use: No   Sexual activity: Not on file  Other Topics Concern   Not on file  Social History Narrative   Works at [employer]. Married.    Social Drivers of Corporate investment banker Strain: Low Risk  (01/03/2023)   Overall Financial Resource Strain (CARDIA)    Difficulty of Paying Living Expenses: Not very hard  Food Insecurity: Patient Unable To Answer (05/26/2023)   Hunger Vital Sign    Worried About Running Out of Food in the Last Year: Patient unable to answer    Ran Out of Food in the Last Year: Patient unable to answer  Transportation Needs: Patient Unable To Answer (05/26/2023)   PRAPARE - Transportation    Lack of Transportation (Medical): Patient unable to answer    Lack of Transportation (Non-Medical): Patient unable to answer  Physical Activity: Inactive (11/22/2022)   Exercise Vital Sign    Days of Exercise per Week: 0 days    Minutes of Exercise per Session: 0 min  Stress: No Stress Concern Present (04/07/2022)   [employer] of Occupational Health - Occupational Stress Questionnaire    Feeling of Stress : Not at all  Social Connections: Unknown (05/26/2023)   Social Connection and Isolation Panel [NHANES]    Frequency of Communication with Friends and Family: Patient unable to answer    Frequency of Social  Gatherings with Friends and Family: Patient unable to answer    Attends Religious Services: Patient unable to answer    Active Member of Clubs or Organizations: Not on file    Attends Banker Meetings: Patient unable to answer    Marital Status: Patient unable to answer   Family History  Problem Relation Age of Onset   Colon cancer Mother    Hypertension Other        in all family members   Scheduled Meds:  allopurinol  300 mg Oral Daily   carvedilol  12.5 mg Oral BID WC   clopidogrel  75 mg Oral Daily   enoxaparin (LOVENOX) injection  40 mg Subcutaneous Daily   ezetimibe  10 mg Oral Daily   ibuprofen  400 mg Oral TID   insulin  aspart  0-20 Units Subcutaneous TID WC   levothyroxine  75 mcg Oral Q0600   rosuvastatin  40 mg Oral Daily   tamsulosin  0.4 mg Oral Daily   Continuous Infusions:  cefTRIAXone (ROCEPHIN)  IV 2 g (06/01/23 0938)   PRN Meds:.acetaminophen **OR** acetaminophen, haloperidol **OR** haloperidol lactate, lidocaine-prilocaine, magnesium hydroxide, nitroGLYCERIN, ondansetron **OR** ondansetron (ZOFRAN) IV, traZODone Medications Prior to Admission:  Prior to Admission medications   Medication Sig Start Date End Date Taking? Authorizing Provider  allopurinol (ZYLOPRIM) 300 MG tablet TAKE 1 TABLET BY MOUTH EVERY DAY 08/30/22  Yes Walisiewicz, Kaitlyn E, PA-C  carvedilol (COREG) 12.5 MG tablet Take 1 tablet (12.5 mg total) by mouth 2 (two) times daily with a meal. 11/18/22  Yes Rolly Salter, MD  clopidogrel (PLAVIX) 75 MG tablet Take 1 tablet (75 mg total) by mouth daily. 11/28/17  Yes Ihor Austin, NP  dapagliflozin propanediol (FARXIGA) 10 MG TABS tablet Take 1 tablet (10 mg total) by mouth daily before breakfast. 04/08/23  Yes Nida, Denman George, MD  ezetimibe (ZETIA) 10 MG tablet Take 1 tablet (10 mg total) by mouth daily. 12/27/22  Yes Branch, Dorothe Pea, MD  insulin regular human CONCENTRATED (HUMULIN R U-500 KWIKPEN) 500 UNIT/ML KwikPen INJECT AS  DIRECTED 60 UNITS WITH BREAKFAST, 60 UNITS WITH LUNCH, AND 60 UNITS WITH SUPPER FOR PRE-MEAL BLOOD GLUCOSE READINGS OF 90MG /DL OR ABOVE Patient taking differently: Inject 60 Units into the skin 3 (three) times daily with meals. INJECT AS DIRECTED 60 UNITS WITH BREAKFAST, 60 UNITS WITH LUNCH, AND 60 UNITS WITH SUPPER FOR PRE-MEAL BLOOD GLUCOSE READINGS OF 90MG /DL OR ABOVE 10/13/17  Yes Nida, Denman George, MD  isosorbide mononitrate (IMDUR) 30 MG 24 hr tablet TAKE 1 TABLET BY MOUTH EVERY DAY Patient taking differently: Take 30 mg by mouth daily. 09/13/22  Yes Branch, Dorothe Pea, MD  levothyroxine (SYNTHROID) 75 MCG tablet TAKE 1 TABLET BY MOUTH EVERY DAY BEFORE BREAKFAST Patient taking differently: Take 75 mcg by mouth daily before breakfast. 04/08/23  Yes Nida, Denman George, MD  lidocaine-prilocaine (EMLA) cream Apply 1 Application topically as needed. Patient taking differently: Apply 1 Application topically daily as needed (port access). 04/29/22  Yes Jaci Standard, MD  losartan (COZAAR) 50 MG tablet Take 50 mg by mouth daily. 04/29/23  Yes [provider]  metFORMIN (GLUCOPHAGE) 500 MG tablet TAKE 1 TABLET BY MOUTH EVERY DAY WITH BREAKFAST Patient taking differently: Take 500 mg by mouth daily with breakfast. 04/08/23  Yes Nida, Denman George, MD  nitroGLYCERIN (NITROSTAT) 0.4 MG SL tablet Place 1 tablet (0.4 mg total) under the tongue every 5 (five) minutes as needed. 08/30/11  Yes de Melanie Crazier, MD  rosuvastatin (CRESTOR) 40 MG tablet TAKE 1 TABLET BY MOUTH EVERY DAY 04/08/23  Yes Branch, Dorothe Pea, MD  tamsulosin (FLOMAX) 0.4 MG CAPS capsule Take 0.4 mg by mouth daily. 02/01/23  Yes [provider]  Continuous Glucose Receiver (FREESTYLE LIBRE 3 READER) DEVI 1 Piece by Does not apply route once as needed for up to 1 dose. 12/13/22   Roma Kayser, MD  Continuous Glucose Sensor (FREESTYLE LIBRE 3 PLUS SENSOR) MISC Change sensor every 15 days. E11.65 04/19/23   Roma Kayser, MD  dapagliflozin propanediol (FARXIGA) 10 MG TABS tablet Take 1 tablet (10 mg total) by mouth daily with breakfast. Patient not taking: Reported on 05/27/2023 01/25/23   Roma Kayser, MD  ONE TOUCH ULTRA TEST test strip USE TO TEST BLOOD SUGAR  FOUR TIMES DAILY E11.65 12/16/15   Roma Kayser, MD   Allergies  Allergen Reactions   Ace Inhibitors Swelling    Angioedema   Other Swelling    Shellfish   Swelling and hives  Shellfish  Swelling and hives   Rituxan [Rituximab] Other (See Comments)    Pt had sensitivity reaction to Rituxan. See progress note from 05/14/22. Pt able to complete infusion.    Shrimp [Shellfish Allergy] Hives and Swelling   Review of Systems Confused Physical Exam Appears to be confused Opens eyes and traces his family members present in the room Has regular work of breathing Attempts to verbalize but is still not able to be coherent with his answers Complains of scrotal and penile swelling and discomfort. Vital Signs: BP 127/67   Pulse 73   Temp 98.1 F (36.7 C)   Resp 16   Ht 6\' 3"  (1.905 m)   Wt 103.1 kg   SpO2 100%   BMI 28.41 kg/m  Pain Scale: 0-10 POSS *See Group Information*: 1-Acceptable,Awake and alert Pain Score: 3    SpO2: SpO2: 100 % O2 Device:SpO2: 100 % O2 Flow Rate: .O2 Flow Rate (L/min): 2 L/min  IO: Intake/output summary:  Intake/Output Summary (Last 24 hours) at 06/01/2023 1351 Last data filed at 06/01/2023 1115 Gross per 24 hour  Intake 1038.1 ml  Output 300 ml  Net 738.1 ml    LBM: Last BM Date : 05/31/23 Baseline Weight: Weight: 112 kg Most recent weight: Weight: 103.1 kg     Palliative Assessment/Data:   Palliative performance scale 30%  Time In: 12 Time Out: 1300 Time Total: 60 Greater than 50%  of this time was spent counseling and coordinating care related to the above assessment and plan.  Signed by: Rosalin Hawking, MD   Please contact Palliative Medicine Team phone at [phone]  for questions and concerns.  For individual provider: See Loretha Stapler

## 2023-06-01 NOTE — Plan of Care (Signed)
  Problem: Clinical Measurements: Goal: Diagnostic test results will improve Outcome: Progressing   Problem: Coping: Goal: Level of anxiety will decrease Outcome: Progressing   Problem: Pain Managment: Goal: General experience of comfort will improve and/or be controlled Outcome: Progressing

## 2023-06-01 NOTE — Plan of Care (Signed)
  Problem: Education: Goal: Knowledge of General Education information will improve Description: Including pain rating scale, medication(s)/side effects and non-pharmacologic comfort measures Outcome: Progressing   Problem: Coping: Goal: Level of anxiety will decrease Outcome: Progressing   Problem: Pain Managment: Goal: General experience of comfort will improve and/or be controlled Outcome: Progressing

## 2023-06-02 DIAGNOSIS — N179 Acute kidney failure, unspecified: Secondary | ICD-10-CM | POA: Diagnosis not present

## 2023-06-02 DIAGNOSIS — E1159 Type 2 diabetes mellitus with other circulatory complications: Secondary | ICD-10-CM

## 2023-06-02 DIAGNOSIS — E860 Dehydration: Secondary | ICD-10-CM | POA: Diagnosis not present

## 2023-06-02 DIAGNOSIS — D329 Benign neoplasm of meninges, unspecified: Secondary | ICD-10-CM | POA: Diagnosis not present

## 2023-06-02 DIAGNOSIS — I6930 Unspecified sequelae of cerebral infarction: Secondary | ICD-10-CM

## 2023-06-02 DIAGNOSIS — I5032 Chronic diastolic (congestive) heart failure: Secondary | ICD-10-CM

## 2023-06-02 DIAGNOSIS — C8330 Diffuse large B-cell lymphoma, unspecified site: Secondary | ICD-10-CM | POA: Diagnosis not present

## 2023-06-02 DIAGNOSIS — I10 Essential (primary) hypertension: Secondary | ICD-10-CM | POA: Diagnosis not present

## 2023-06-02 DIAGNOSIS — R1900 Intra-abdominal and pelvic swelling, mass and lump, unspecified site: Secondary | ICD-10-CM | POA: Diagnosis not present

## 2023-06-02 LAB — GLUCOSE, CAPILLARY
Glucose-Capillary: 89 mg/dL (ref 70–99)
Glucose-Capillary: 141 mg/dL — ABNORMAL HIGH (ref 70–99)
Glucose-Capillary: 125 mg/dL — ABNORMAL HIGH (ref 70–99)

## 2023-06-02 NOTE — Progress Notes (Signed)
 Hematology/Oncology Progress Note  Clinical Summary: Alex Cortez is a 73 year old male with medical history significant for diffuse large B-cell lymphoma in remission who presents with evaluation of altered mental status was found to have concern for meningitis versus CNS recurrence of his lymphoma.  Interval History: -- Alex Cortez is still altered. Speaking clearly but with stream of consciousness -- patient fell asleep during exam.  --Notes that he is not having any headache, vision changes, fevers, chills, sweats -- pathology results confirm DBLCL in the CSF and in the abdominal mass -- Labs show white blood cell 4.3, hemoglobin 11.5, MCV 84.6, platelets 122 -- ID, neurology, and palliative care are following.  O:  Vitals:   06/02/23 0629 06/02/23 0843  BP: 132/63 132/63  Pulse: 68 68  Resp: 17   Temp: 98.4 F (36.9 C)   SpO2: 100%       Latest Ref Rng & Units 06/01/2023    4:36 AM 05/28/2023    5:17 AM 05/26/2023    4:14 AM  CMP  Glucose 70 - 99 mg/dL 82  86  409   BUN 8 - 23 mg/dL 24  25  35   Creatinine 0.61 - 1.24 mg/dL 8.11  9.14  7.82   Sodium 135 - 145 mmol/L 139  140  141   Potassium 3.5 - 5.1 mmol/L 3.7  4.2  4.5   Chloride 98 - 111 mmol/L 112  112  110   CO2 22 - 32 mmol/L 15  17  20    Calcium 8.9 - 10.3 mg/dL 8.6  8.9  9.0   Total Protein 6.5 - 8.1 g/dL 5.7     Total Bilirubin 0.0 - 1.2 mg/dL 0.7     Alkaline Phos 38 - 126 U/L 41     AST 15 - 41 U/L 36     ALT 0 - 44 U/L 38         Latest Ref Rng & Units 06/01/2023    4:36 AM 05/28/2023    5:17 AM 05/26/2023    4:14 AM  CBC  WBC 4.0 - 10.5 K/uL 4.3  5.3  5.5   Hemoglobin 13.0 - 17.0 g/dL 95.6  21.3  08.6   Hematocrit 39.0 - 52.0 % 37.4  39.9  38.9   Platelets 150 - 400 K/uL 122  136  189       GENERAL: Well-appearing elderly African-American male in NAD  SKIN: skin color, texture, turgor are normal, no rashes or significant lesions EYES: conjunctiva are pink and non-injected, sclera  clear LUNGS: clear to auscultation and percussion with normal breathing effort HEART: regular rate & rhythm and no murmurs and no lower extremity edema ABDOMEN: soft, non-tender, non-distended, normal bowel sounds Musculoskeletal: no cyanosis of digits and no clubbing  PSYCH: Alert and oriented, responds appropriately.  Assessment/Plan: Alex Cortez is a 73 year old male with medical history significant for diffuse large B-cell lymphoma in remission who presents with evaluation of altered mental status was found to have concern for meningitis versus CNS recurrence of his lymphoma.  Today I had a detailed discussion with the patient's mother, sister, and wife.  We discussed the concerning findings and pathology and the lack of good treatment options moving forward.  We discussed that the treatment for this type of lymphoma recurrence is quite harsh and typically not very effective.  Treatment would include intrathecal methotrexate as well as intensive chemotherapy.  I do not believe that he is strong enough at this time  to handle treatment, additionally he is altered and quite weak.  I did discuss his care with the [facility] lymphoma team who agree that comfort based care/hospice is the best route moving forward.  The patient and family voiced understanding of our recommendations for hospice/comfort based care.  Recommend continued discussions with palliative care service.  # Alerted Mental Status #Leptomeningeal Recurrence of DLBCL -- Lumbar puncture and biopsy of abdominal mass confirmed recurrence of diffuse large B-cell lymphoma. -- Of note the patient has been in remission since 10/25/2022, with his last chemo in June 2024 -- Recommended to family that we pursue comfort based care/hospice. --Discussed his care with the [facility] lymphoma team who agree with comfort based care --There are no good treatment options moving forward for this patient.  He has a very poor prognosis,  anticipate his remaining lifespan to best be measured in weeks --Appreciate assistance of neurology and palliative care services. --Oncology service will continue to follow.    Ulysees Barns, MD Department of Hematology/Oncology [facility] Phone: [phone] Pager: [phone] Email: [email or web address]

## 2023-06-02 NOTE — Progress Notes (Signed)
 Daily Progress Note   Patient Name: Alex Cortez       Date: 06/02/2023 DOB: 28-May-1950  Age: 73 y.o. MRN#: [id number] Attending Physician: Debarah Crape, DO Primary Care Physician: Juliette Alcide, MD Admit Date: 05/25/2023  Reason for Consultation/Follow-up: Establishing goals of care  Subjective: Confused, currently on bedpan.  Continues to have scrotal edema and generalized discomfort.  Wife at bedside.  Length of Stay: 6  Current Medications: Scheduled Meds:   allopurinol  300 mg Oral Daily   carvedilol  12.5 mg Oral BID WC   clopidogrel  75 mg Oral Daily   enoxaparin (LOVENOX) injection  40 mg Subcutaneous Daily   ezetimibe  10 mg Oral Daily   folic acid  1 mg Oral Daily   ibuprofen  400 mg Oral TID   insulin aspart  0-20 Units Subcutaneous TID WC   levothyroxine  75 mcg Oral Q0600   rosuvastatin  40 mg Oral Daily   tamsulosin  0.4 mg Oral Daily    Continuous Infusions:  cefTRIAXone (ROCEPHIN)  IV 2 g (06/02/23 0922)    PRN Meds: acetaminophen **OR** acetaminophen, haloperidol **OR** haloperidol lactate, lidocaine-prilocaine, magnesium hydroxide, nitroGLYCERIN, ondansetron **OR** ondansetron (ZOFRAN) IV, traZODone  Physical Exam         Elderly appearing African-American gentleman resting in bed Confused Appears in mild distress Has regular work of breathing Does not have lower extremity edema  Vital Signs: BP (!) 127/59 (BP Location: Right Arm)   Pulse 65   Temp 98.1 F (36.7 C) (Oral)   Resp 18   Ht 6\' 3"  (1.905 m)   Wt 103.1 kg   SpO2 98%   BMI 28.41 kg/m  SpO2: SpO2: 98 % O2 Device: O2 Device: Room Air O2 Flow Rate: O2 Flow Rate (L/min): 2 L/min  Intake/output summary:  Intake/Output Summary (Last 24 hours) at 06/02/2023 1353 Last data filed  at 06/02/2023 1300 Gross per 24 hour  Intake 224 ml  Output 0 ml  Net 224 ml   LBM: Last BM Date : 05/31/23 Baseline Weight: Weight: 112 kg Most recent weight: Weight: 103.1 kg       Palliative Assessment/Data:      Patient Active Problem List   Diagnosis Date Noted   Meningioma (HCC) 05/26/2023   Pelvic mass 05/26/2023   AKI (acute  kidney injury) (HCC) 05/25/2023   Chronic diastolic CHF (congestive heart failure) (HCC) 11/17/2022   Coronary artery disease 11/17/2022   Insulin long-term use (HCC) 08/10/2022   Port-A-Cath in place 05/05/2022   DLBCL (diffuse large B cell lymphoma) (HCC) 04/23/2022   Hypothyroidism 01/12/2018   History of CVA with residual deficit    Essential hypertension    Type 2 diabetes mellitus with obesity (HCC)    Sedentary lifestyle 09/09/2015   Mixed hyperlipidemia 02/24/2015   Exertional dyspnea 10/18/2012   Diastolic heart failure (HCC) 10/01/2011   Obstructive sleep apnea 09/02/2011   LV dysfunction 09/02/2011   ERECTILE DYSFUNCTION, SECONDARY TO MEDICATION 04/14/2010   Type 2 diabetes mellitus with vascular disease (HCC) 10/02/2009   CORONARY ATHEROSCLEROSIS NATIVE CORONARY ARTERY 10/02/2009    Palliative Care Assessment & Plan   Patient Profile:    Assessment: 73 year old gentleman with medical history significant for diffuse large B-cell lymphoma, admitted for altered mental status.  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  Lumbar puncture and biopsy of abdominal mass confirms recurrence of diffuse large B-cell lymphoma.  Recommendations/Plan: Goals of care discussions: Dr. Derek Mound note reviewed.  I discussed with patient, wife.  Additional discussions undertaken with the patient's wife outside of the room.  She was present when medical oncology follow-up with her on 06-01-2023.  Patient's wife states that she understands that the patient has a very poor prognosis and a very short life  expectancy.  She wishes for him to avoid pain and suffering.  They have been married for over 30 years, second marriage for both, patient has adult children who live locally and are reportedly aware of the serious incurable nature of his condition.  Patient wife asks about next steps.  Discussed with her about establishing CODE STATUS is DNR/DNI.  Offered recommendation that the patient will not benefit from a resuscitation event given the current situation.  She is in favor of establishing DNR/DNI.  Additionally, I introduced hospice philosophy of care and compared and contrasted home with hospice versus residential hospice.  Patient's wife request for evaluation for residential hospice.  Will request TOC.  All of the patient's wife's questions and concerns addressed to the best of my ability.  End-of-life signs and symptoms discussed in detail.  Decline trajectory explained.  All of her questions addressed.  Goals of Care and Additional Recommendations: Limitations on Scope of Treatment: Full Comfort Care  Code Status:    Code Status Orders  (From admission, onward)           Start     Ordered   06/02/23 1354  Do not attempt resuscitation (DNR) - Comfort care  (Code Status)  Continuous       Question Answer Comment  If patient has no pulse and is not breathing Do Not Attempt Resuscitation   In Pre-Arrest Conditions (Patient Is Breathing and Has a Pulse) Provide comfort measures. Relieve any mechanical airway obstruction. Avoid transfer unless required for comfort.   Consent: Discussion documented in EHR or advanced directives reviewed      06/02/23 1353           Code Status History     Date Active Date Inactive Code Status Order ID Comments User Context   05/26/2023 0009 06/02/2023 1353 Full Code [id number]  [facility] Vernetta Honey, MD Inpatient   11/17/2022 0220 11/18/2022 1834 Full Code [id number]  [facility] Vernetta Honey, MD Inpatient   07/11/2022 0736 07/14/2022 1848 Full Code [id number]  Vassie Loll,  MD  Inpatient   04/26/2022 1226 04/27/2022 0507 Full Code [id number]  Bennie Dallas, MD HOV   07/31/2017 1043 08/03/2017 2131 Full Code [id number]  Reymundo Poll, MD Inpatient       Prognosis:  < 2 weeks  Discharge Planning: Hospice facility  Care plan was discussed with wife  Thank you for allowing the Palliative Medicine Team to assist in the care of this patient.  High MDM     Greater than 50%  of this time was spent counseling and coordinating care related to the above assessment and plan.  Rosalin Hawking, MD  Please contact Palliative Medicine Team phone at [phone] for questions and concerns.

## 2023-06-02 NOTE — Progress Notes (Signed)
 PROGRESS NOTE    Alex Cortez  XBJ:[id number] DOB: 1950/06/18 DOA: 05/25/2023 PCP: Juliette Alcide, MD  Chief Complaint  Patient presents with   Weakness    Hospital Course:  Alex Cortez is 73 y.o. male with B cell lymphoma CAD, type 2 diabetes, hypertension, history of CVA, history of frontotemporal meningioma, initially admitted to [facility] for progressive weakness and decreasing ability to walk over the week prior. Workup revealed progressive pericolonic inflammatory stranding and a 3 x 6 soft tissue density within the presacral space.  MRI brain revealed a 6 cm extra-axial mass likely meningioma.  Patient was transferred to [facility] for biopsy of presacral area.  Biopsy resulted for B-cell lymphoma. His confusion became progressively worse.  Brain MRI was performed which was notable for abnormal leptomeningeal enhancement which could reveal infectious or lymphomatous meningitis.  He was also noted to have a 13 mm area of enhancement in the inferior left cerebellum differential including subacute infarct or CNS lymphoma.  Neurology was consulted.  He was initiated on empiric meningitis coverage and underwent LP with notable lymphocytic pleocytosis, elevated CSF protein, normal CSF glucose.  CSF culture and bio fire panel was negative, and antibiotics were de-escalated.  His confusion has persisted despite antibiotics.  Ultimately CSF resulted for B-cell lymphoma. Given recurrence of his B-cell lymphoma, oncology was consulted and discussed directly with [facility] lymphoma team but unfortunately there are no further interventions available to him.  All teams are now recommending hospice and palliative care.  Subjective: No acute changes overnight.  Had extensive discussion with the patient's wife at bedside today regarding hospice and palliative care options.  Patient is largely unchanged.  He is alert during my evaluation, becomes frequently disoriented and  confused  Objective: Vitals:   06/01/23 1203 06/01/23 1647 06/01/23 2003 06/02/23 0629  BP: 127/67 (!) 154/73 (!) 153/68 132/63  Pulse: 73 75 68 68  Resp: 16 18 17 17   Temp:   98.1 F (36.7 C) 98.4 F (36.9 C)  TempSrc:   Oral   SpO2: 100% 100% 100% 100%  Weight:      Height:        Intake/Output Summary (Last 24 hours) at 06/02/2023 0818 Last data filed at 06/02/2023 0630 Gross per 24 hour  Intake 220 ml  Output 300 ml  Net -80 ml   Filed Weights   05/25/23 1746 05/26/23 0002  Weight: 112 kg 103.1 kg    Examination: General exam: Appears calm and comfortable, NAD  Respiratory system: No work of breathing, symmetric chest wall expansion Cardiovascular system: S1 & S2 heard, RRR.  Gastrointestinal system: Abdomen is nondistended, soft and nontender.  Neuro: Oriented to self and situation. Intermittently confused. Extremities: Symmetric, expected ROM Skin: No rashes, lesions Psychiatry: Calm, redirectable.  Assessment & Plan:  Principal Problem:   AKI (acute kidney injury) (HCC) Active Problems:   Meningioma (HCC)   Pelvic mass   Type 2 diabetes mellitus with vascular disease (HCC)   History of CVA with residual deficit   Essential hypertension   DLBCL (diffuse large B cell lymphoma) (HCC)   Chronic diastolic CHF (congestive heart failure) (HCC)   Comfort measures only - Given recent diagnosis of diffuse large B-cell lymphoma recurrence, patient no longer has curative treatment options.  After multiple discussions with multiple family members and assistance of palliative care team, they have decided to proceed with comfort measures only and are seeking hospice care outpatient. - TOC has been consulted to assist in  these arrangements - In the interim we will discontinue all medications that do not immediately provide the patient comfort - qShift vitals, no lab draws    Diffuse large B-cell lymphoma with recurrence - MRI brain 3/13: Abnormal leptomeningeal  enhancement which could be infectious or lymphomatous - Lumbar puncture 3/14 path + B Cell lymphoma  - Oncology consulted and following.  Oncology has discussed directly with [facility] lymphoma team and unfortunately there is no further intervention.  Recommending palliative care/hospice. Acute encephalopathy - Initially presumed to be infectious, CSF studies have been negative. Likely 2/2 to CNS Lymphoma -- LP path consistent with lymphoma - Was initially started on ampicillin, has since been discontinued - Neurology consulted - MRI brain with multiple abnormalities: Abnormal leptomeningeal enhancement which could be infectious or lymphomatous - Workup broadened with B12, folate, TSH, thiamine, RPR, HIV.  Studies have been largely within normal limits.  Folate low.  Thiamine pending.  Continue folic acid supplementation. - EEG: Suggestive of diffuse encephalopathy.  No seizures or epileptiform discharges Presacral mass, Consistent with B-cell lymphoproliferative disorder. - 3 x 5 cm soft tissue density in presacral area status post biopsy with interventional radiology - Results: Consistent with B-cell lymphoproliferative disorder. - Oncology is following, appreciate recs Epididymitis Scrotal edema - Prior to arrival. Seen on scrotal ultrasound prior to hospitalization.  Mass is likely interfering with drainage - Antibiotics treating for infectious sources as above -  Continue anti-inflammatory, elevation, tamsulosin. Discussed with RN - Cont to monitor for retention.  Currently voiding without issue AKI - Resolved with IV fluids Staph hemolyticus bacteremia - 1 out of 2 blood cultures positive.  Unclear if true infection vs contaminent.  Repeat blood cultures 3/18 negative to date - Has been on ceftriaxone x 6 days.  Will discontinue in light of comfort measures only status. - Echocardiogram without evidence of valvular vegetations Heart failure with preserved EF CAD Hypertension -  Echocardiogram 3/15: LVEF 55 to 60%, no regional wall motion abnormalities, grade 1 diastolic dysfunction. - As needed Lasix, Clinically euvolemic at this time - Continue current meds, cont to titrate GDMT as BP tolerates - Continue Plavix, Zetia, statin History of CVA - Resume home meds including Plavix. Type 2 diabetes - Continue sliding scale insulin, titrate up as tolerated BMI 28 -- Outpatient follow up for lifestyle modification and risk factor management   DVT prophylaxis: n/a as CMO   Code Status: Full Code Family Communication:  Discussed directly with patient's wife at bedside Disposition:  Inpatient, TOC consulted for hospice arrangements  Consultants:  Treatment Team:  Consulting Physician: Jaci Standard, MD  Procedures:    Antimicrobials:  Anti-infectives (From admission, onward)    Start     Dose/Rate Route Frequency Ordered Stop   06/01/23 1000  cefTRIAXone (ROCEPHIN) 2 g in sodium chloride 0.9 % 100 mL IVPB        2 g 200 mL/hr over 30 Minutes Intravenous Every 24 hours 05/31/23 0956     05/29/23 1500  vancomycin (VANCOREADY) IVPB 750 mg/150 mL  Status:  Discontinued        750 mg 150 mL/hr over 60 Minutes Intravenous Every 12 hours 05/29/23 1019 05/30/23 1248   05/28/23 1500  vancomycin (VANCOCIN) IVPB 1000 mg/200 mL premix  Status:  Discontinued        1,000 mg 200 mL/hr over 60 Minutes Intravenous Every 12 hours 05/28/23 0157 05/28/23 1137   05/28/23 1500  vancomycin (VANCOREADY) IVPB 1250 mg/250 mL  Status:  Discontinued  1,250 mg 166.7 mL/hr over 90 Minutes Intravenous Every 12 hours 05/28/23 1137 05/29/23 1019   05/28/23 0400  cefTRIAXone (ROCEPHIN) 2 g in sodium chloride 0.9 % 100 mL IVPB  Status:  Discontinued        2 g 200 mL/hr over 30 Minutes Intravenous Every 12 hours 05/28/23 0308 05/31/23 0956   05/28/23 0400  ampicillin (OMNIPEN) 2 g in sodium chloride 0.9 % 100 mL IVPB  Status:  Discontinued        2 g 300 mL/hr over 20 Minutes  Intravenous Every 4 hours 05/28/23 0309 05/31/23 0728   05/28/23 0245  vancomycin (VANCOREADY) IVPB 2000 mg/400 mL        2,000 mg 200 mL/hr over 120 Minutes Intravenous  Once 05/28/23 0155 05/28/23 0446   05/26/23 1800  cefTRIAXone (ROCEPHIN) 2 g in sodium chloride 0.9 % 100 mL IVPB  Status:  Discontinued        2 g 200 mL/hr over 30 Minutes Intravenous Every 24 hours 05/26/23 0350 05/26/23 1354   05/26/23 0445  cefTRIAXone (ROCEPHIN) 2 g in sodium chloride 0.9 % 100 mL IVPB  Status:  Discontinued        2 g 200 mL/hr over 30 Minutes Intravenous Every 24 hours 05/26/23 0351 05/26/23 0406   05/25/23 1830  cefTRIAXone (ROCEPHIN) 2 g in sodium chloride 0.9 % 100 mL IVPB        2 g 200 mL/hr over 30 Minutes Intravenous Once 05/25/23 1822 05/25/23 1932       Data Reviewed: I have personally reviewed following labs and imaging studies CBC: Recent Labs  Lab 05/28/23 0517 06/01/23 0436  WBC 5.3 4.3  NEUTROABS 2.8  --   HGB 11.9* 11.5*  HCT 39.9 37.4*  MCV 85.6 84.6  PLT 136* 122*   Basic Metabolic Panel: Recent Labs  Lab 05/28/23 0517 06/01/23 0436  NA 140 139  K 4.2 3.7  CL 112* 112*  CO2 17* 15*  GLUCOSE 86 82  BUN 25* 24*  CREATININE 1.00 1.20  CALCIUM 8.9 8.6*   GFR: Estimated Creatinine Clearance: 71.3 mL/min (by C-G formula based on SCr of 1.2 mg/dL). Liver Function Tests: Recent Labs  Lab 06/01/23 0436  AST 36  ALT 38  ALKPHOS 41  BILITOT 0.7  PROT 5.7*  ALBUMIN 2.6*   CBG: Recent Labs  Lab 05/31/23 2040 06/01/23 0727 06/01/23 1207 06/01/23 1604 06/02/23 0619  GLUCAP 121* 98 173* 73 89    Recent Results (from the past 240 hours)  Resp panel by RT-PCR (RSV, Flu A&B, Covid) Anterior Nasal Swab     Status: None   Collection Time: 05/25/23  6:30 PM   Specimen: Anterior Nasal Swab  Result Value Ref Range Status   SARS Coronavirus 2 by RT PCR NEGATIVE NEGATIVE Final    Comment: (NOTE) SARS-CoV-2 target nucleic acids are NOT DETECTED.  The  SARS-CoV-2 RNA is generally detectable in upper respiratory specimens during the acute phase of infection. The lowest concentration of SARS-CoV-2 viral copies this assay can detect is 138 copies/mL. A negative result does not preclude SARS-Cov-2 infection and should not be used as the sole basis for treatment or other patient management decisions. A negative result may occur with  improper specimen collection/handling, submission of specimen other than nasopharyngeal swab, presence of viral mutation(s) within the areas targeted by this assay, and inadequate number of viral copies(<138 copies/mL). A negative result must be combined with clinical observations, patient history, and epidemiological information. The expected  result is Negative.  Fact Sheet for Patients:  BloggerCourse.com  Fact Sheet for Healthcare Providers:  SeriousBroker.it  This test is no t yet approved or cleared by the [place] FDA and  has been authorized for detection and/or diagnosis of SARS-CoV-2 by FDA under an Emergency Use Authorization (EUA). This EUA will remain  in effect (meaning this test can be used) for the duration of the COVID-19 declaration under Section 564(b)(1) of the Act, 21 U.S.C.section 360bbb-3(b)(1), unless the authorization is terminated  or revoked sooner.       Influenza A by PCR NEGATIVE NEGATIVE Final   Influenza B by PCR NEGATIVE NEGATIVE Final    Comment: (NOTE) The Xpert Xpress SARS-CoV-2/FLU/RSV plus assay is intended as an aid in the diagnosis of influenza from Nasopharyngeal swab specimens and should not be used as a sole basis for treatment. Nasal washings and aspirates are unacceptable for Xpert Xpress SARS-CoV-2/FLU/RSV testing.  Fact Sheet for Patients: BloggerCourse.com  Fact Sheet for Healthcare Providers: SeriousBroker.it  This test is not yet approved or  cleared by the [place] FDA and has been authorized for detection and/or diagnosis of SARS-CoV-2 by FDA under an Emergency Use Authorization (EUA). This EUA will remain in effect (meaning this test can be used) for the duration of the COVID-19 declaration under Section 564(b)(1) of the Act, 21 U.S.C. section 360bbb-3(b)(1), unless the authorization is terminated or revoked.     Resp Syncytial Virus by PCR NEGATIVE NEGATIVE Final    Comment: (NOTE) Fact Sheet for Patients: BloggerCourse.com  Fact Sheet for Healthcare Providers: SeriousBroker.it  This test is not yet approved or cleared by the [place] FDA and has been authorized for detection and/or diagnosis of SARS-CoV-2 by FDA under an Emergency Use Authorization (EUA). This EUA will remain in effect (meaning this test can be used) for the duration of the COVID-19 declaration under Section 564(b)(1) of the Act, 21 U.S.C. section 360bbb-3(b)(1), unless the authorization is terminated or revoked.  Performed at [facility], [address] [place], [place] [address]   Blood Culture (routine x 2)     Status: None   Collection Time: 05/25/23  7:56 PM   Specimen: BLOOD  Result Value Ref Range Status   Specimen Description BLOOD BLOOD RIGHT HAND  Final   Special Requests   Final    BOTTLES DRAWN AEROBIC AND ANAEROBIC Blood Culture results may not be optimal due to an inadequate volume of blood received in culture bottles   Culture   Final    NO GROWTH 5 DAYS Performed at [facility], [address] [place], [place] [address]    Report Status 05/30/2023 FINAL  Final  Blood Culture (routine x 2)     Status: Abnormal   Collection Time: 05/25/23  7:59 PM   Specimen: Left Antecubital; Blood  Result Value Ref Range Status   Specimen Description   Final    LEFT ANTECUBITAL BOTTLES DRAWN AEROBIC AND ANAEROBIC Performed at [facility], [address], [place], [place]  [address]    Special Requests   Final    Blood Culture results may not be optimal due to an inadequate volume of blood received in culture bottles Performed at [facility], [address], [place], [place] [address]    Culture  Setup Time   Final    GRAM POSITIVE COCCI Gram Stain Report Called to,Read Back By and Verified With: BROWN,K @ 0112 ON 05/28/23 BY JUW AEROBIC BOTTLE ONLY GS DONE @ [facility] CRITICAL RESULT  CALLED TO, READ BACK BY AND VERIFIED WITH: M BELL,PHARMD@0737  05/28/23 MK    Culture (A)  Final    STAPHYLOCOCCUS HAEMOLYTICUS THE SIGNIFICANCE OF ISOLATING THIS ORGANISM FROM A SINGLE SET OF BLOOD CULTURES WHEN MULTIPLE SETS ARE DRAWN IS UNCERTAIN. PLEASE NOTIFY THE MICROBIOLOGY DEPARTMENT WITHIN ONE WEEK IF SPECIATION AND SENSITIVITIES ARE REQUIRED. Performed at [facility], [address] [place], [place] [address]    Report Status 05/29/2023 FINAL  Final  Blood Culture ID Panel (Reflexed)     Status: Abnormal   Collection Time: 05/25/23  7:59 PM  Result Value Ref Range Status   Enterococcus faecalis NOT DETECTED NOT DETECTED Final   Enterococcus Faecium NOT DETECTED NOT DETECTED Final   Listeria monocytogenes NOT DETECTED NOT DETECTED Final   Staphylococcus species DETECTED (A) NOT DETECTED Final    Comment: CRITICAL RESULT CALLED TO, READ BACK BY AND VERIFIED WITH: M BELL,PHARMD@0737  05/28/23 MK    Staphylococcus aureus (BCID) NOT DETECTED NOT DETECTED Final   Staphylococcus epidermidis NOT DETECTED NOT DETECTED Final   Staphylococcus lugdunensis NOT DETECTED NOT DETECTED Final   Streptococcus species NOT DETECTED NOT DETECTED Final   Streptococcus agalactiae NOT DETECTED NOT DETECTED Final   Streptococcus pneumoniae NOT DETECTED NOT DETECTED Final   Streptococcus pyogenes NOT DETECTED NOT DETECTED Final   A.calcoaceticus-baumannii NOT DETECTED NOT DETECTED Final   Bacteroides fragilis NOT DETECTED NOT DETECTED Final   Enterobacterales NOT DETECTED NOT DETECTED  Final   Enterobacter cloacae complex NOT DETECTED NOT DETECTED Final   Escherichia coli NOT DETECTED NOT DETECTED Final   Klebsiella aerogenes NOT DETECTED NOT DETECTED Final   Klebsiella oxytoca NOT DETECTED NOT DETECTED Final   Klebsiella pneumoniae NOT DETECTED NOT DETECTED Final   Proteus species NOT DETECTED NOT DETECTED Final   Salmonella species NOT DETECTED NOT DETECTED Final   Serratia marcescens NOT DETECTED NOT DETECTED Final   Haemophilus influenzae NOT DETECTED NOT DETECTED Final   Neisseria meningitidis NOT DETECTED NOT DETECTED Final   Pseudomonas aeruginosa NOT DETECTED NOT DETECTED Final   Stenotrophomonas maltophilia NOT DETECTED NOT DETECTED Final   Candida albicans NOT DETECTED NOT DETECTED Final   Candida auris NOT DETECTED NOT DETECTED Final   Candida glabrata NOT DETECTED NOT DETECTED Final   Candida krusei NOT DETECTED NOT DETECTED Final   Candida parapsilosis NOT DETECTED NOT DETECTED Final   Candida tropicalis NOT DETECTED NOT DETECTED Final   Cryptococcus neoformans/gattii NOT DETECTED NOT DETECTED Final    Comment: Performed at [facility], [address] [place], [place] [address]  CSF culture w Gram Stain     Status: None   Collection Time: 05/27/23  4:10 PM   Specimen: CSF  Result Value Ref Range Status   Specimen Description   Final    CSF LP Performed at [facility], [address], [place], [place] [address]    Special Requests   Final    Immunocompromised Performed at [facility], [address] [place], [place] [address]    Gram Stain   Final    WBC PRESENT, PREDOMINANTLY MONONUCLEAR NO ORGANISMS SEEN CYTOSPIN SMEAR Gram Stain Report Called to,Read Back By and Verified With: Cloria Spring RN Performed at [facility], [address] [place], [place] [address]    Culture   Final    NO GROWTH 3 DAYS Performed at [facility], [address], [place], [place] [address]     Report Status 05/31/2023  FINAL  Final  Culture, blood (Routine X 2) w Reflex to ID Panel     Status: None (Preliminary result)   Collection Time: 05/31/23  6:11 PM   Specimen: BLOOD  Result Value Ref Range Status   Specimen Description   Final    BLOOD BLOOD LEFT HAND Performed at [facility], [address] [place], [place] [address]    Special Requests   Final    BOTTLES DRAWN AEROBIC ONLY Blood Culture results may not be optimal due to an inadequate volume of blood received in culture bottles Performed at [facility], [address], [place], [place] [address]    Culture   Final    NO GROWTH 2 DAYS Performed at [facility], [address], [place], [place] [address]    Report Status PENDING  Incomplete  Culture, blood (Routine X 2) w Reflex to ID Panel     Status: None (Preliminary result)   Collection Time: 05/31/23  9:03 PM   Specimen: BLOOD  Result Value Ref Range Status   Specimen Description   Final    BLOOD BLOOD RIGHT HAND Performed at [facility], [address], [place], [place] [address]    Special Requests   Final    BOTTLES DRAWN AEROBIC AND ANAEROBIC Blood Culture results may not be optimal due to an inadequate volume of blood received in culture bottles Performed at [facility], [address], [place], [place] [address]    Culture   Final    NO GROWTH 2 DAYS Performed at [facility], [address], [place], [place] [address]    Report Status PENDING  Incomplete     Radiology Studies: EEG adult Result Date: 06/01/2023 Charlsie Quest, MD     06/01/2023  8:42 AM Patient Name: LLEWELLYN CHOPLIN MRN: [id number] Epilepsy Attending: Charlsie Quest Referring Physician/Provider: Erick Blinks, MD Date: 05/31/2023 Duration: 24.51 mins Patient history: 73yo M with ams. EEG to evaluate for seizure Level of alertness: Awake, aslepe AEDs during EEG study: None Technical  aspects: This EEG study was done with scalp electrodes positioned according to the 10-20 International system of electrode placement. Electrical activity was reviewed with band pass filter of 1-70Hz , sensitivity of 7 uV/mm, display speed of 50mm/sec with a 60Hz  notched filter applied as appropriate. EEG data were recorded continuously and digitally stored.  Video monitoring was available and reviewed as appropriate. Description: The posterior dominant rhythm consists of 8-9 Hz activity of moderate voltage (25-35 uV) seen predominantly in posterior head regions, symmetric and reactive to eye opening and eye closing. Sleep was characterized by vertex waves, sleep spindles (12 to 14 Hz), maximal frontocentral region. EEG showed intermittent generalized 3 to 6 Hz theta-delta slowing. Hyperventilation and photic stimulation were not performed.   ABNORMALITY - Intermittent slow, generalized IMPRESSION: This study is suggestive of mild diffuse encephalopathy. No seizures or epileptiform discharges were seen throughout the recording. Priyanka Annabelle Harman    Scheduled Meds:  allopurinol  300 mg Oral Daily   carvedilol  12.5 mg Oral BID WC   clopidogrel  75 mg Oral Daily   enoxaparin (LOVENOX) injection  40 mg Subcutaneous Daily   ezetimibe  10 mg Oral Daily   folic acid  1 mg Oral Daily   ibuprofen  400 mg Oral TID   insulin aspart  0-20 Units Subcutaneous TID WC   levothyroxine  75 mcg Oral Q0600   rosuvastatin  40 mg Oral Daily   tamsulosin  0.4 mg Oral Daily   Continuous Infusions:  cefTRIAXone (ROCEPHIN)  IV 2 g (06/01/23 0938)     LOS: 6 days  MDM: Patient is high risk for one or more organ failure.  They necessitate ongoing hospitalization for continued IV therapies and subsequent lab monitoring. Total time spent interpreting labs and vitals, coordinating care amongst consultants and care team members, directly assessing and discussing care with the patient and/or family: 55 min    Debarah Crape,  DO [facility]  To contact the attending physician between 7A-7P please use Epic Chat. To contact the covering physician during after hours 7P-7A, please review Amion.   06/02/2023, 8:18 AM   *This document has been created with the assistance of dictation software. Please excuse typographical errors. *

## 2023-06-02 NOTE — Progress Notes (Signed)
 Physical Therapy Treatment Patient Details Name: Alex Cortez MRN: [id number] DOB: 09-20-1950 Today's Date: 06/02/2023   History of Present Illness Alex Cortez is a 73 y.o. male admitted to [facility]  , who presented to ED with [facility] of lower abdominal pain with associated N/V. W/u revealed progressive pericolonic inflammatory stranding and a 3 x 6 soft tissue density within the presacral space which may be a phlegmon or an infiltrative mass. MRI brain showed 6 cm extra axial mass that is likely a meningioma.  Patient is transferred to [facility] for biopsy of presacral area and for LP. Final cytology and pathology are currently pending PMH:  DM2, dyslipidemia, essential hypertension, CVA and CAD    PT Comments  Unfortunately, Pt NOT progress with Therapy, requiring increased physical assistance. Assisted OOB to recliner was difficult.  General bed mobility comments: pt required increased assist to transfer from supine to EOB.  Required Max Assist initially to prevent seated LOB.  Poor posture/collapsed.  Poor balance.  Sat EOB with full support x 8 min while waiting for + 2 assist.  Engaging in conversation.  Pt started talking about his Lymphoma. "They say there is no cure".  Pt asking for coffee. General transfer comment: Pt required increased asisst + 2 Total/Max Assist to rise from elevated bed then partially complete 1/4 pivot to recliner as B LE buckled.  Then required + 2 asisst to complete scooting to back/center of recliner.  Profound weakness this session.  Also appears more edenamous/heavy throughout. General Gait Details: transfer only this session.  Pt unable to support his weight. On 05/30/23 Pt was able to amb 4 feet On 05/26/23 Pt was able to amb 70 feet Per medical, Pt has a poor prognosis. Discharge disposition TBD (home Hospice/Residential Hospice)   If plan is discharge home, recommend the following: A lot of help with walking and/or transfers;Help with stairs or ramp  for entrance;A little help with bathing/dressing/bathroom;Supervision due to cognitive status   Can travel by private vehicle     No  Equipment Recommendations       Recommendations for Other Services       Precautions / Restrictions Precautions Precautions: Fall Precaution/Restrictions Comments: scrotal edema Restrictions Weight Bearing Restrictions Per Provider Order: No     Mobility  Bed Mobility Overal bed mobility: Needs Assistance Bed Mobility: Supine to Sit     Supine to sit: Max assist, Total assist, +2 for physical assistance, +2 for safety/equipment     General bed mobility comments: pt required increased assist to transfer from supine to EOB.  Required Max Assist initially to prevent seated LOB.  Poor posture/collapsed.  Poor balance.  Sat EOB with full support x 8 min while waiting for + 2 assist.  Engaging in conversation.  Pt started talking about his Lymphoma. "They say there is no cure".  Pt asking for coffee.    Transfers Overall transfer level: Needs assistance Equipment used: 2 person hand held assist Transfers: Sit to/from Stand Sit to Stand: Max assist, +2 physical assistance, +2 safety/equipment, Total assist           General transfer comment: Pt required increased asisst + 2 Total/Max Assist to rise from elevated bed then partially complete 1/4 pivot to recliner as B LE buckled.  Then required + 2 asisst to complete scooting to back/center of recliner.  Profound weakness this session.  Also appears more edenamous/heavy throughout.    Ambulation/Gait  General Gait Details: transfer only this session.  Pt unable to support his weight.   Stairs             Wheelchair Mobility     Tilt Bed    Modified Rankin (Stroke Patients Only)       Balance                                            Communication    Cognition Arousal: Alert, Lethargic Behavior During Therapy: WFL for tasks  assessed/performed                           PT - Cognition Comments: very sleepy/groggy this session. Increased time sitting EOB to arouse.  Sister and Mom in room visiting.        Cueing    Exercises      General Comments        Pertinent Vitals/Pain Pain Assessment Pain Assessment: Faces Faces Pain Scale: Hurts little more Pain Location: scrotal Pain Descriptors / Indicators: Burning, Pressure, Sore Pain Intervention(s): Repositioned, Monitored during session    Home Living                          Prior Function            PT Goals (current goals can now be found in the care plan section) Progress towards PT goals: Not progressing toward goals - comment    Frequency    Min 3X/week      PT Plan      Co-evaluation              AM-PAC PT "6 Clicks" Mobility   Outcome Measure  Help needed turning from your back to your side while in a flat bed without using bedrails?: Total Help needed moving from lying on your back to sitting on the side of a flat bed without using bedrails?: Total Help needed moving to and from a bed to a chair (including a wheelchair)?: Total Help needed standing up from a chair using your arms (e.g., wheelchair or bedside chair)?: Total Help needed to walk in hospital room?: Total Help needed climbing 3-5 steps with a railing? : Total 6 Click Score: 6    End of Session Equipment Utilized During Treatment: Gait belt Activity Tolerance: Patient limited by fatigue Patient left: in chair;with call bell/phone within reach;with chair alarm set;with family/visitor present Nurse Communication: Mobility status;Need for lift equipment PT Visit Diagnosis: Unsteadiness on feet (R26.81);Other abnormalities of gait and mobility (R26.89);Muscle weakness (generalized) (M62.81);History of falling (Z91.81);Difficulty in walking, not elsewhere classified (R26.2)     Time: 1414-1440 PT Time Calculation (min) (ACUTE ONLY):  26 min  Charges:    $Therapeutic Activity: 23-37 mins PT General Charges $$ ACUTE PT VISIT: 1 Visit                     Felecia Shelling  PTA Acute  Rehabilitation Services Office M-F          [phone]

## 2023-06-02 NOTE — Plan of Care (Signed)

## 2023-06-02 NOTE — Progress Notes (Signed)
 Hematology/Oncology Progress Note  Clinical Summary: Mr. Affan Callow is a 73 year old male with medical history significant for diffuse large B-cell lymphoma in remission who presents with evaluation of altered mental status was found to have concern for meningitis versus CNS recurrence of his lymphoma.  Interval History: -- Family at bedside including son, sister, mother, and nephew. --Patient is more alert and clear today, sitting in chair watching basketball -- Labs drawn yesterday, no new labs today. -- ID, neurology, and palliative care are following. -- Palliative care is helping the family work towards establishing with hospice.  O:  Vitals:   06/02/23 1301 06/02/23 1611  BP: (!) 127/59 (!) 127/59  Pulse: 65 65  Resp: 18   Temp: 98.1 F (36.7 C)   SpO2: 98%       Latest Ref Rng & Units 06/01/2023    4:36 AM 05/28/2023    5:17 AM 05/26/2023    4:14 AM  CMP  Glucose 70 - 99 mg/dL 82  86  865   BUN 8 - 23 mg/dL 24  25  35   Creatinine 0.61 - 1.24 mg/dL 7.84  6.96  2.95   Sodium 135 - 145 mmol/L 139  140  141   Potassium 3.5 - 5.1 mmol/L 3.7  4.2  4.5   Chloride 98 - 111 mmol/L 112  112  110   CO2 22 - 32 mmol/L 15  17  20    Calcium 8.9 - 10.3 mg/dL 8.6  8.9  9.0   Total Protein 6.5 - 8.1 g/dL 5.7     Total Bilirubin 0.0 - 1.2 mg/dL 0.7     Alkaline Phos 38 - 126 U/L 41     AST 15 - 41 U/L 36     ALT 0 - 44 U/L 38         Latest Ref Rng & Units 06/01/2023    4:36 AM 05/28/2023    5:17 AM 05/26/2023    4:14 AM  CBC  WBC 4.0 - 10.5 K/uL 4.3  5.3  5.5   Hemoglobin 13.0 - 17.0 g/dL 28.4  13.2  44.0   Hematocrit 39.0 - 52.0 % 37.4  39.9  38.9   Platelets 150 - 400 K/uL 122  136  189       GENERAL: Well-appearing elderly African-American male in NAD  SKIN: skin color, texture, turgor are normal, no rashes or significant lesions EYES: conjunctiva are pink and non-injected, sclera clear LUNGS: clear to auscultation and percussion with normal breathing effort HEART:  regular rate & rhythm and no murmurs and no lower extremity edema ABDOMEN: soft, non-tender, non-distended, normal bowel sounds Musculoskeletal: no cyanosis of digits and no clubbing  PSYCH: Alert and oriented, responds appropriately.  Assessment/Plan: Mr. Arnel Wymer is a 73 year old male with medical history significant for diffuse large B-cell lymphoma in remission who presents with evaluation of altered mental status was found to have concern for meningitis versus CNS recurrence of his lymphoma.  Today I had a detailed discussion with the patient's mother, sister, and wife.  We discussed the concerning findings and pathology and the lack of good treatment options moving forward.  We discussed that the treatment for this type of lymphoma recurrence is quite harsh and typically not very effective.  Treatment would include intrathecal methotrexate as well as intensive chemotherapy.  I do not believe that he is strong enough at this time to handle treatment, additionally he is altered and quite weak.  I did discuss his  care with the [facility] lymphoma team who agree that comfort based care/hospice is the best route moving forward.  The patient and family voiced understanding of our recommendations for hospice/comfort based care.  Recommend continued discussions with palliative care service.  # Alerted Mental Status #Leptomeningeal Recurrence of DLBCL -- Lumbar puncture and biopsy of abdominal mass confirmed recurrence of diffuse large B-cell lymphoma. -- Of note the patient has been in remission since 10/25/2022, with his last chemo in June 2024 PLAN:  -- Recommended to family that we pursue comfort based care/hospice. --Discussed his care with the [facility] lymphoma team who agree with comfort based care --There are no good treatment options moving forward for this patient.  He has a very poor prognosis, anticipate his remaining lifespan to best be measured in weeks --Appreciate assistance  of neurology and palliative care services. --Oncology service will continue to follow. Will continue to visit per family request.    Ulysees Barns, MD Department of Hematology/Oncology [facility] Phone: [phone] Pager: [phone] Email: [email or web address]

## 2023-06-02 NOTE — Plan of Care (Signed)
   Problem: Health Behavior/Discharge Planning: Goal: Ability to manage health-related needs will improve Outcome: Progressing   Problem: Clinical Measurements: Goal: Will remain free from infection Outcome: Progressing

## 2023-06-02 NOTE — Progress Notes (Signed)
 Physical Therapy Discharge Patient Details Name: Alex Cortez MRN: [id number] DOB: 15-May-1950 Today's Date: 06/02/2023 Time: 7829-5621 PT Time Calculation (min) (ACUTE ONLY): 26 min  Patient discharged from PT services secondary to medical decline - will need to re-order PT to resume therapy services. Note GOC discussion, possible residential  HOSPICE, COMFORT CARE.  Please see latest therapy progress note for current level of functioning and progress toward goals.    Progress and discharge plan discussed with patient and/or caregiver: Patient unable to participate in discharge planning and no caregivers available  Blanchard Kelch PT Acute Rehabilitation Services Office [phone]        Rada Hay 06/02/2023, 4:25 PM

## 2023-06-03 DIAGNOSIS — N179 Acute kidney failure, unspecified: Secondary | ICD-10-CM | POA: Diagnosis not present

## 2023-06-03 LAB — GLUCOSE, CAPILLARY
Glucose-Capillary: 80 mg/dL (ref 70–99)
Glucose-Capillary: 109 mg/dL — ABNORMAL HIGH (ref 70–99)
Glucose-Capillary: 126 mg/dL — ABNORMAL HIGH (ref 70–99)
Glucose-Capillary: 95 mg/dL (ref 70–99)

## 2023-06-03 LAB — VITAMIN B1: Vitamin B1 (Thiamine): 75.8 nmol/L (ref 66.5–200.0)

## 2023-06-03 NOTE — Progress Notes (Signed)
 SLP Cancellation Note  Patient Details Name: Alex Cortez MRN: [id number] DOB: 03/30/1950   Cancelled treatment:       Reason Eval/Treat Not Completed: Other (comment) (pt going hospice per chart review, will s/o)  Rolena Infante, MS [facility] SLP Acute Rehab Services Office [phone]  Chales Abrahams 06/03/2023, 10:11 AM

## 2023-06-03 NOTE — TOC Progression Note (Signed)
 Transition of Care [facility]) - Progression Note    Patient Details  Name: Alex Cortez MRN: [id number] Date of Birth: [birth year]  Transition of Care [facility]) CM/SW Contact  Adrian Prows, RN Phone Number: 06/03/2023, 1:02 PM  Clinical Narrative:    [facility] consult for residential hospice; spoke w/ [name] [phone]) in room; she says she would like for pt to d/c to residential hospice at [facility]; referral called into [facility]'; she says agency does not have intake person to evaluate pt today, and there are no beds available today; Delfina Redwood' also says she will pass this on to weekend intake nurse, and they will update TOC tomorrow.   Expected Discharge Plan: Skilled Nursing Facility Barriers to Discharge: Continued Medical Work up  Expected Discharge Plan and Services In-house Referral: Clinical Social Work   Post Acute Care Choice: Durable Medical Equipment Living arrangements for the past 2 months: Single Family Home                                       Social Determinants of Health (SDOH) Interventions SDOH Screenings   Food Insecurity: Patient Unable To Answer (05/26/2023)  Housing: Low Risk  (05/26/2023)  Transportation Needs: Patient Unable To Answer (05/26/2023)  Utilities: Patient Unable To Answer (05/26/2023)  Depression (PHQ2-9): Low Risk  (04/07/2022)  Financial Resource Strain: Low Risk  (01/03/2023)  Physical Activity: Inactive (11/22/2022)  Social Connections: Unknown (05/26/2023)  Stress: No Stress Concern Present (04/07/2022)  Tobacco Use: Medium Risk (05/25/2023)  Health Literacy: Adequate Health Literacy (12/06/2022)    Readmission Risk Interventions    05/31/2023   10:09 AM 11/17/2022   10:57 AM 07/14/2022   12:04 PM  Readmission Risk Prevention Plan  Transportation Screening Complete Complete Complete  PCP or Specialist Appt within 5-7 Days Complete    Home Care Screening Complete    Medication Review (RN CM) Complete    HRI or Home  Care Consult  Complete Complete  Social Work Consult for Recovery Care Planning/Counseling  Complete Complete  Palliative Care Screening  Not Applicable Not Applicable  Medication Review Ocean[employer])  Complete Complete

## 2023-06-03 NOTE — Plan of Care (Signed)
  Problem: Education: Goal: Knowledge of General Education information will improve Description: Including pain rating scale, medication(s)/side effects and non-pharmacologic comfort measures Outcome: Progressing   Problem: Clinical Measurements: Goal: Ability to maintain clinical measurements within normal limits will improve Outcome: Progressing   Problem: Clinical Measurements: Goal: Will remain free from infection Outcome: Progressing   Problem: Clinical Measurements: Goal: Diagnostic test results will improve Outcome: Progressing   Problem: Clinical Measurements: Goal: Respiratory complications will improve Outcome: Progressing   Problem: Clinical Measurements: Goal: Cardiovascular complication will be avoided Outcome: Progressing   Problem: Activity: Goal: Risk for activity intolerance will decrease Outcome: Progressing   Problem: Nutrition: Goal: Adequate nutrition will be maintained Outcome: Progressing   Problem: Coping: Goal: Level of anxiety will decrease Outcome: Progressing   Problem: Elimination: Goal: Will not experience complications related to bowel motility Outcome: Progressing   Problem: Elimination: Goal: Will not experience complications related to urinary retention Outcome: Progressing   Problem: Pain Managment: Goal: General experience of comfort will improve and/or be controlled Outcome: Progressing   Problem: Safety: Goal: Ability to remain free from injury will improve Outcome: Progressing   Problem: Skin Integrity: Goal: Risk for impaired skin integrity will decrease Outcome: Progressing   Problem: Nutritional: Goal: Maintenance of adequate nutrition will improve Outcome: Progressing   Problem: Nutritional: Goal: Progress toward achieving an optimal weight will improve Outcome: Progressing

## 2023-06-03 NOTE — Progress Notes (Signed)
 PROGRESS NOTE    Alex Cortez  ZOX:[id number] DOB: 08/09/1950 DOA: 05/25/2023 PCP: Juliette Alcide, MD  Chief Complaint  Patient presents with   Weakness    Hospital Course:  Alex Cortez is 73 y.o. male with B cell lymphoma CAD, type 2 diabetes, hypertension, history of CVA, history of frontotemporal meningioma, initially admitted to [facility] for progressive weakness and decreasing ability to walk over the week prior. Workup revealed progressive pericolonic inflammatory stranding and a 3 x 6 soft tissue density within the presacral space.  MRI brain revealed a 6 cm extra-axial mass likely meningioma.  Patient was transferred to [facility] for biopsy of presacral area.  Biopsy resulted for B-cell lymphoma. His confusion became progressively worse.  Brain MRI was performed which was notable for abnormal leptomeningeal enhancement which could reveal infectious or lymphomatous meningitis.  He was also noted to have a 13 mm area of enhancement in the inferior left cerebellum differential including subacute infarct or CNS lymphoma.  Neurology was consulted.  He was initiated on empiric meningitis coverage and underwent LP with notable lymphocytic pleocytosis, elevated CSF protein, normal CSF glucose.  CSF culture and bio fire panel was negative, and antibiotics were de-escalated.  His confusion has persisted despite antibiotics.  Ultimately CSF resulted for B-cell lymphoma. Given recurrence of his B-cell lymphoma, oncology was consulted and discussed directly with [facility] lymphoma team but unfortunately there are no further interventions available to him.  All teams are now recommending hospice and palliative care.  Subjective: Patient is acutely confused this morning.  Has no complaints.  Does not appear to be in pain, his wife and brother-in-law are at bedside.  We discussed care plan and expected clinical course  Objective: Vitals:   06/02/23 1849 06/02/23 2000 06/03/23 0421  06/03/23 0817  BP: (!) 112/47 113/63 131/61 127/60  Pulse: 62 61 64 64  Resp: 18 18 20 17   Temp: 98.1 F (36.7 C) 98.2 F (36.8 C) 97.7 F (36.5 C) 98.8 F (37.1 C)  TempSrc: Oral   Oral  SpO2: 98% 99% 99% 98%  Weight:      Height:        Intake/Output Summary (Last 24 hours) at 06/03/2023 0846 Last data filed at 06/03/2023 8119 Gross per 24 hour  Intake 124 ml  Output 125 ml  Net -1 ml   Filed Weights   05/25/23 1746 05/26/23 0002  Weight: 112 kg 103.1 kg    Examination: General exam: Appears calm and comfortable, NAD  Respiratory system: No work of breathing, symmetric chest wall expansion Cardiovascular system: S1 & S2 heard, RRR.  Gastrointestinal system: Abdomen is nondistended, soft and nontender.  Neuro: Oriented to self and situation. Intermittently confused. Extremities: Symmetric, expected ROM Skin: No rashes, lesions Psychiatry: Calm, redirectable.  Assessment & Plan:  Principal Problem:   Acute kidney injury (HCC) Active Problems:   Meningioma (HCC)   Pelvic mass   Type 2 diabetes mellitus with vascular disease (HCC)   History of CVA with residual deficit   Essential hypertension   DLBCL (diffuse large B cell lymphoma) (HCC)   Chronic diastolic CHF (congestive heart failure) (HCC)   Dehydration   AKI (acute kidney injury) (HCC)   Comfort measures only - Given recent diagnosis of diffuse large B-cell lymphoma recurrence, patient no longer has curative treatment options.  After multiple discussions with multiple family members and assistance of palliative care team, they have decided to proceed with comfort measures only and are seeking hospice  care outpatient. - TOC has been consulted to assist in these arrangements - In the interim we will discontinue all medications that do not immediately provide the patient comfort - qShift vitals, no lab draws - Continue to monitor for symptomatic needs.  Continue with current pain medications, titrate as  needed - TOC is working on outpatient hospice arrangements.  No bed available today.  Diffuse large B-cell lymphoma with recurrence - MRI brain 3/13: Abnormal leptomeningeal enhancement which could be infectious or lymphomatous - Lumbar puncture 3/14 path + B Cell lymphoma  - Oncology consulted and following.  Oncology has discussed directly with [facility] lymphoma team and unfortunately there is no further intervention.  Recommending palliative care/hospice. Acute encephalopathy - Initially presumed to be infectious, CSF studies have been negative. Likely 2/2 to CNS Lymphoma -- LP path consistent with lymphoma - Was initially started on ampicillin, has since been discontinued - Neurology consulted - MRI brain with multiple abnormalities: Abnormal leptomeningeal enhancement which could be infectious or lymphomatous - Workup broadened with B12, folate, TSH, thiamine, RPR, HIV.  Studies have been largely within normal limits.  Folate low.  Thiamine pending.  Continue folic acid supplementation. - EEG: Suggestive of diffuse encephalopathy.  No seizures or epileptiform discharges Presacral mass, Consistent with B-cell lymphoproliferative disorder. - 3 x 5 cm soft tissue density in presacral area status post biopsy with interventional radiology - Results: Consistent with B-cell lymphoproliferative disorder. - Oncology is following, appreciate recs Epididymitis Scrotal edema - Prior to arrival. Seen on scrotal ultrasound prior to hospitalization.  Mass is likely interfering with drainage - Antibiotics treating for infectious sources as above -  Continue anti-inflammatory, elevation, tamsulosin. Discussed with RN - Cont to monitor for retention.  Currently voiding without issue AKI - Resolved with IV fluids Staph hemolyticus bacteremia - 1 out of 2 blood cultures positive.  Unclear if true infection vs contaminent.  Repeat blood cultures 3/18 negative to date - Has been on ceftriaxone x 6  days.  Will discontinue in light of comfort measures only status. - Echocardiogram without evidence of valvular vegetations Heart failure with preserved EF CAD Hypertension - Echocardiogram 3/15: LVEF 55 to 60%, no regional wall motion abnormalities, grade 1 diastolic dysfunction. - As needed Lasix, Clinically euvolemic at this time - Continue current meds - Continue Plavix History of CVA - Resume home meds including Plavix. Stop stain and Zetia  Type 2 diabetes BMI 28   DVT prophylaxis: n/a as CMO   Code Status: Do not attempt resuscitation (DNR) - Comfort care Family Communication:  Discussed directly with patient's wife and brother in law at bedside Disposition:  Inpatient, [facility] consulted for hospice arrangements. DC when this has been arranged   Consultants:  Treatment Team:  Consulting Physician: Jaci Standard, MD  Procedures:    Antimicrobials:  Anti-infectives (From admission, onward)    Start     Dose/Rate Route Frequency Ordered Stop   06/01/23 1000  cefTRIAXone (ROCEPHIN) 2 g in sodium chloride 0.9 % 100 mL IVPB  Status:  Discontinued        2 g 200 mL/hr over 30 Minutes Intravenous Every 24 hours 05/31/23 0956 06/02/23 1555   05/29/23 1500  vancomycin (VANCOREADY) IVPB 750 mg/150 mL  Status:  Discontinued        750 mg 150 mL/hr over 60 Minutes Intravenous Every 12 hours 05/29/23 1019 05/30/23 1248   05/28/23 1500  vancomycin (VANCOCIN) IVPB 1000 mg/200 mL premix  Status:  Discontinued  1,000 mg 200 mL/hr over 60 Minutes Intravenous Every 12 hours 05/28/23 0157 05/28/23 1137   05/28/23 1500  vancomycin (VANCOREADY) IVPB 1250 mg/250 mL  Status:  Discontinued        1,250 mg 166.7 mL/hr over 90 Minutes Intravenous Every 12 hours 05/28/23 1137 05/29/23 1019   05/28/23 0400  cefTRIAXone (ROCEPHIN) 2 g in sodium chloride 0.9 % 100 mL IVPB  Status:  Discontinued        2 g 200 mL/hr over 30 Minutes Intravenous Every 12 hours 05/28/23 0308 05/31/23 0956    05/28/23 0400  ampicillin (OMNIPEN) 2 g in sodium chloride 0.9 % 100 mL IVPB  Status:  Discontinued        2 g 300 mL/hr over 20 Minutes Intravenous Every 4 hours 05/28/23 0309 05/31/23 0728   05/28/23 0245  vancomycin (VANCOREADY) IVPB 2000 mg/400 mL        2,000 mg 200 mL/hr over 120 Minutes Intravenous  Once 05/28/23 0155 05/28/23 0446   05/26/23 1800  cefTRIAXone (ROCEPHIN) 2 g in sodium chloride 0.9 % 100 mL IVPB  Status:  Discontinued        2 g 200 mL/hr over 30 Minutes Intravenous Every 24 hours 05/26/23 0350 05/26/23 1354   05/26/23 0445  cefTRIAXone (ROCEPHIN) 2 g in sodium chloride 0.9 % 100 mL IVPB  Status:  Discontinued        2 g 200 mL/hr over 30 Minutes Intravenous Every 24 hours 05/26/23 0351 05/26/23 0406   05/25/23 1830  cefTRIAXone (ROCEPHIN) 2 g in sodium chloride 0.9 % 100 mL IVPB        2 g 200 mL/hr over 30 Minutes Intravenous Once 05/25/23 1822 05/25/23 1932       Data Reviewed: I have personally reviewed following labs and imaging studies CBC: Recent Labs  Lab 05/28/23 0517 06/01/23 0436  WBC 5.3 4.3  NEUTROABS 2.8  --   HGB 11.9* 11.5*  HCT 39.9 37.4*  MCV 85.6 84.6  PLT 136* 122*   Basic Metabolic Panel: Recent Labs  Lab 05/28/23 0517 06/01/23 0436  NA 140 139  K 4.2 3.7  CL 112* 112*  CO2 17* 15*  GLUCOSE 86 82  BUN 25* 24*  CREATININE 1.00 1.20  CALCIUM 8.9 8.6*   GFR: Estimated Creatinine Clearance: 71.3 mL/min (by C-G formula based on SCr of 1.2 mg/dL). Liver Function Tests: Recent Labs  Lab 06/01/23 0436  AST 36  ALT 38  ALKPHOS 41  BILITOT 0.7  PROT 5.7*  ALBUMIN 2.6*   CBG: Recent Labs  Lab 06/01/23 1604 06/02/23 0619 06/02/23 1107 06/02/23 1605 06/03/23 0810  GLUCAP 73 89 141* 125* 80    Recent Results (from the past 240 hours)  Resp panel by RT-PCR (RSV, Flu A&B, Covid) Anterior Nasal Swab     Status: None   Collection Time: 05/25/23  6:30 PM   Specimen: Anterior Nasal Swab  Result Value Ref Range Status    SARS Coronavirus 2 by RT PCR NEGATIVE NEGATIVE Final    Comment: (NOTE) SARS-CoV-2 target nucleic acids are NOT DETECTED.  The SARS-CoV-2 RNA is generally detectable in upper respiratory specimens during the acute phase of infection. The lowest concentration of SARS-CoV-2 viral copies this assay can detect is 138 copies/mL. A negative result does not preclude SARS-Cov-2 infection and should not be used as the sole basis for treatment or other patient management decisions. A negative result may occur with  improper specimen collection/handling, submission of specimen other  than nasopharyngeal swab, presence of viral mutation(s) within the areas targeted by this assay, and inadequate number of viral copies(<138 copies/mL). A negative result must be combined with clinical observations, patient history, and epidemiological information. The expected result is Negative.  Fact Sheet for Patients:  BloggerCourse.com  Fact Sheet for Healthcare Providers:  SeriousBroker.it  This test is no t yet approved or cleared by the [place] FDA and  has been authorized for detection and/or diagnosis of SARS-CoV-2 by FDA under an Emergency Use Authorization (EUA). This EUA will remain  in effect (meaning this test can be used) for the duration of the COVID-19 declaration under Section 564(b)(1) of the Act, 21 U.S.C.section 360bbb-3(b)(1), unless the authorization is terminated  or revoked sooner.       Influenza A by PCR NEGATIVE NEGATIVE Final   Influenza B by PCR NEGATIVE NEGATIVE Final    Comment: (NOTE) The Xpert Xpress SARS-CoV-2/FLU/RSV plus assay is intended as an aid in the diagnosis of influenza from Nasopharyngeal swab specimens and should not be used as a sole basis for treatment. Nasal washings and aspirates are unacceptable for Xpert Xpress SARS-CoV-2/FLU/RSV testing.  Fact Sheet for  Patients: BloggerCourse.com  Fact Sheet for Healthcare Providers: SeriousBroker.it  This test is not yet approved or cleared by the [place] FDA and has been authorized for detection and/or diagnosis of SARS-CoV-2 by FDA under an Emergency Use Authorization (EUA). This EUA will remain in effect (meaning this test can be used) for the duration of the COVID-19 declaration under Section 564(b)(1) of the Act, 21 U.S.C. section 360bbb-3(b)(1), unless the authorization is terminated or revoked.     Resp Syncytial Virus by PCR NEGATIVE NEGATIVE Final    Comment: (NOTE) Fact Sheet for Patients: BloggerCourse.com  Fact Sheet for Healthcare Providers: SeriousBroker.it  This test is not yet approved or cleared by the [place] FDA and has been authorized for detection and/or diagnosis of SARS-CoV-2 by FDA under an Emergency Use Authorization (EUA). This EUA will remain in effect (meaning this test can be used) for the duration of the COVID-19 declaration under Section 564(b)(1) of the Act, 21 U.S.C. section 360bbb-3(b)(1), unless the authorization is terminated or revoked.  Performed at [facility], [address] [place], [place] [address]   Blood Culture (routine x 2)     Status: None   Collection Time: 05/25/23  7:56 PM   Specimen: BLOOD  Result Value Ref Range Status   Specimen Description BLOOD BLOOD RIGHT HAND  Final   Special Requests   Final    BOTTLES DRAWN AEROBIC AND ANAEROBIC Blood Culture results may not be optimal due to an inadequate volume of blood received in culture bottles   Culture   Final    NO GROWTH 5 DAYS Performed at [facility], [address] [place], [place] [address]    Report Status 05/30/2023 FINAL  Final  Blood Culture (routine x 2)     Status: Abnormal   Collection Time: 05/25/23  7:59 PM   Specimen: Left Antecubital; Blood  Result Value  Ref Range Status   Specimen Description   Final    LEFT ANTECUBITAL BOTTLES DRAWN AEROBIC AND ANAEROBIC Performed at [facility], [address], [place], [place] [address]    Special Requests   Final    Blood Culture results may not be optimal due to an inadequate volume of blood received in culture bottles Performed at [facility], [address] [place], [place] [address]    Culture  Setup Time   Final    GRAM POSITIVE COCCI Gram Stain Report Called to,Read Back By and Verified With: BROWN,K @ 0112 ON 05/28/23 BY JUW AEROBIC BOTTLE ONLY GS DONE @ [facility] CRITICAL RESULT CALLED TO, READ BACK BY AND VERIFIED WITH: M BELL,PHARMD@0737  05/28/23 MK    Culture (A)  Final    STAPHYLOCOCCUS HAEMOLYTICUS THE SIGNIFICANCE OF ISOLATING THIS ORGANISM FROM A SINGLE SET OF BLOOD CULTURES WHEN MULTIPLE SETS ARE DRAWN IS UNCERTAIN. PLEASE NOTIFY THE MICROBIOLOGY DEPARTMENT WITHIN ONE WEEK IF SPECIATION AND SENSITIVITIES ARE REQUIRED. Performed at [facility], [address] [place], [place] [address]    Report Status 05/29/2023 FINAL  Final  Blood Culture ID Panel (Reflexed)     Status: Abnormal   Collection Time: 05/25/23  7:59 PM  Result Value Ref Range Status   Enterococcus faecalis NOT DETECTED NOT DETECTED Final   Enterococcus Faecium NOT DETECTED NOT DETECTED Final   Listeria monocytogenes NOT DETECTED NOT DETECTED Final   Staphylococcus species DETECTED (A) NOT DETECTED Final    Comment: CRITICAL RESULT CALLED TO, READ BACK BY AND VERIFIED WITH: M BELL,PHARMD@0737  05/28/23 MK    Staphylococcus aureus (BCID) NOT DETECTED NOT DETECTED Final   Staphylococcus epidermidis NOT DETECTED NOT DETECTED Final   Staphylococcus lugdunensis NOT DETECTED NOT DETECTED Final   Streptococcus species NOT DETECTED NOT DETECTED Final   Streptococcus agalactiae NOT DETECTED NOT DETECTED Final   Streptococcus pneumoniae NOT DETECTED NOT DETECTED Final   Streptococcus pyogenes NOT DETECTED NOT DETECTED  Final   A.calcoaceticus-baumannii NOT DETECTED NOT DETECTED Final   Bacteroides fragilis NOT DETECTED NOT DETECTED Final   Enterobacterales NOT DETECTED NOT DETECTED Final   Enterobacter cloacae complex NOT DETECTED NOT DETECTED Final   Escherichia coli NOT DETECTED NOT DETECTED Final   Klebsiella aerogenes NOT DETECTED NOT DETECTED Final   Klebsiella oxytoca NOT DETECTED NOT DETECTED Final   Klebsiella pneumoniae NOT DETECTED NOT DETECTED Final   Proteus species NOT DETECTED NOT DETECTED Final   Salmonella species NOT DETECTED NOT DETECTED Final   Serratia marcescens NOT DETECTED NOT DETECTED Final   Haemophilus influenzae NOT DETECTED NOT DETECTED Final   Neisseria meningitidis NOT DETECTED NOT DETECTED Final   Pseudomonas aeruginosa NOT DETECTED NOT DETECTED Final   Stenotrophomonas maltophilia NOT DETECTED NOT DETECTED Final   Candida albicans NOT DETECTED NOT DETECTED Final   Candida auris NOT DETECTED NOT DETECTED Final   Candida glabrata NOT DETECTED NOT DETECTED Final   Candida krusei NOT DETECTED NOT DETECTED Final   Candida parapsilosis NOT DETECTED NOT DETECTED Final   Candida tropicalis NOT DETECTED NOT DETECTED Final   Cryptococcus neoformans/gattii NOT DETECTED NOT DETECTED Final    Comment: Performed at [facility], [address], [place], [place] [address]  CSF culture w Gram Stain     Status: None   Collection Time: 05/27/23  4:10 PM   Specimen: CSF  Result Value Ref Range Status   Specimen Description   Final    CSF LP Performed at [facility], [address] [place], [place] [address]    Special Requests   Final    Immunocompromised Performed at [facility], [address], [place], [place] [address]    Gram Stain   Final    WBC PRESENT, PREDOMINANTLY MONONUCLEAR NO ORGANISMS SEEN CYTOSPIN SMEAR Gram Stain Report Called to,Read Back By and Verified With: Cloria Spring RN Performed at [facility], [address] [place], [place] [address]    Culture   Final    NO GROWTH 3 DAYS Performed at [facility], [address], [place], [place] [address]    Report Status 05/31/2023 FINAL  Final  Culture, blood (Routine X 2) w Reflex to ID Panel     Status: None (Preliminary result)   Collection Time: 05/31/23  6:11 PM   Specimen: BLOOD  Result Value Ref Range Status   Specimen Description   Final    BLOOD BLOOD LEFT HAND Performed at [facility], [address] [place], [place] [address]    Special Requests   Final    BOTTLES DRAWN AEROBIC ONLY Blood Culture results may not be optimal due to an inadequate volume of blood received in culture bottles Performed at [facility], [address] [place], [place] [address]    Culture   Final    NO GROWTH 3 DAYS Performed at [facility], [address] [place], [place] [address]    Report Status PENDING  Incomplete  Culture, blood (Routine X 2) w Reflex to ID Panel     Status: None (Preliminary result)   Collection Time: 05/31/23  9:03 PM   Specimen: BLOOD  Result Value Ref Range Status   Specimen Description   Final    BLOOD BLOOD RIGHT HAND Performed at [facility], [address] [place], [place] [address]    Special Requests   Final    BOTTLES DRAWN AEROBIC AND ANAEROBIC Blood Culture results may not be optimal due to an inadequate volume of blood received in culture bottles Performed at [facility], [address] [place], [place] [address]    Culture   Final    NO GROWTH 3 DAYS Performed at [facility], [address], [place], [place] [address]    Report Status PENDING  Incomplete     Radiology Studies: No results found.   Scheduled Meds:  allopurinol  300 mg Oral Daily   carvedilol  12.5 mg Oral BID WC   clopidogrel  75 mg Oral Daily   ezetimibe  10 mg Oral Daily   folic acid  1 mg Oral Daily   insulin  aspart  0-20 Units Subcutaneous TID WC   levothyroxine  75 mcg Oral Q0600   rosuvastatin  40 mg Oral Daily   tamsulosin  0.4 mg Oral Daily   Continuous Infusions:     LOS: 7 days    Debarah Crape, DO [facility]  To contact the attending physician between 7A-7P please use Epic Chat. To contact the covering physician during after hours 7P-7A, please review Amion.   06/03/2023, 8:46 AM   *This document has been created with the assistance of dictation software. Please excuse typographical errors. *

## 2023-06-03 NOTE — Progress Notes (Signed)
 Daily Progress Note   Patient Name: Alex Cortez       Date: 06/03/2023 DOB: [birth year]  Age: 73 y.o. MRN#: [id number] Attending Physician: Debarah Crape, DO Primary Care Physician: Juliette Alcide, MD Admit Date: 05/25/2023  Reason for Consultation/Follow-up: Establishing goals of care  Subjective: Confused, appears with at least mild to moderate generalized discomfort.  Wife at bedside.  Length of Stay: 7  Current Medications: Scheduled Meds:   allopurinol  300 mg Oral Daily   carvedilol  12.5 mg Oral BID WC   clopidogrel  75 mg Oral Daily   ezetimibe  10 mg Oral Daily   folic acid  1 mg Oral Daily   insulin aspart  0-20 Units Subcutaneous TID WC   levothyroxine  75 mcg Oral Q0600   rosuvastatin  40 mg Oral Daily   tamsulosin  0.4 mg Oral Daily    Continuous Infusions:    PRN Meds: acetaminophen **OR** acetaminophen, haloperidol **OR** haloperidol lactate, lidocaine-prilocaine, magnesium hydroxide, nitroGLYCERIN, ondansetron **OR** ondansetron (ZOFRAN) IV, traZODone  Physical Exam         Elderly appearing African-American gentleman resting in bed Confused Appears in mild distress Has regular work of breathing Does not have lower extremity edema  Vital Signs: BP 127/60 (BP Location: Right Arm)   Pulse 64   Temp 98.8 F (37.1 C) (Oral)   Resp 17   Ht 6\' 3"  (1.905 m)   Wt 103.1 kg   SpO2 98%   BMI 28.41 kg/m  SpO2: SpO2: 98 % O2 Device: O2 Device: Room Air O2 Flow Rate: O2 Flow Rate (L/min): 2 L/min  Intake/output summary:  Intake/Output Summary (Last 24 hours) at 06/03/2023 1047 Last data filed at 06/03/2023 0900 Gross per 24 hour  Intake 344 ml  Output 125 ml  Net 219 ml   LBM: Last BM Date : 06/02/23 Baseline Weight: Weight: 112 kg Most recent  weight: Weight: 103.1 kg       Palliative Assessment/Data:      Patient Active Problem List   Diagnosis Date Noted   Dehydration 06/02/2023   AKI (acute kidney injury) (HCC) 06/02/2023   Meningioma (HCC) 05/26/2023   Pelvic mass 05/26/2023   Acute kidney injury (HCC) 05/25/2023   Chronic diastolic CHF (congestive heart failure) (HCC) 11/17/2022   Coronary artery disease 11/17/2022  Insulin long-term use (HCC) 08/10/2022   Port-A-Cath in place 05/05/2022   DLBCL (diffuse large B cell lymphoma) (HCC) 04/23/2022   Hypothyroidism 01/12/2018   History of CVA with residual deficit    Essential hypertension    Type 2 diabetes mellitus with obesity (HCC)    Sedentary lifestyle 09/09/2015   Mixed hyperlipidemia 02/24/2015   Exertional dyspnea 10/18/2012   Diastolic heart failure (HCC) 10/01/2011   Obstructive sleep apnea 09/02/2011   LV dysfunction 09/02/2011   ERECTILE DYSFUNCTION, SECONDARY TO MEDICATION 04/14/2010   Type 2 diabetes mellitus with vascular disease (HCC) 10/02/2009   CORONARY ATHEROSCLEROSIS NATIVE CORONARY ARTERY 10/02/2009    Palliative Care Assessment & Plan   Patient Profile:    Assessment: 73 year old gentleman with medical history significant for diffuse large B-cell lymphoma, admitted for altered mental status.  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  Lumbar puncture and biopsy of abdominal mass confirms recurrence of diffuse large B-cell lymphoma.  Recommendations/Plan: DNR DNI Comfort care Transfer to residential hospice [facility] consult.   Explained comfort care and residential hospice care to wife again.   Goals of Care and Additional Recommendations: Limitations on Scope of Treatment: Full Comfort Care  Code Status:    Code Status Orders  (From admission, onward)           Start     Ordered   06/02/23 1354  Do not attempt resuscitation (DNR) - Comfort care  (Code Status)  Continuous        Question Answer Comment  If patient has no pulse and is not breathing Do Not Attempt Resuscitation   In Pre-Arrest Conditions (Patient Is Breathing and Has a Pulse) Provide comfort measures. Relieve any mechanical airway obstruction. Avoid transfer unless required for comfort.   Consent: Discussion documented in EHR or advanced directives reviewed      06/02/23 1353           Code Status History     Date Active Date Inactive Code Status Order ID Comments User Context   05/26/2023 0009 06/02/2023 1353 Full Code [id number]  [facility] Vernetta Honey, MD Inpatient   11/17/2022 0220 11/18/2022 1834 Full Code [id number]  [facility] Vernetta Honey, MD Inpatient   07/11/2022 0736 07/14/2022 1848 Full Code [id number]  Vassie Loll, MD Inpatient   04/26/2022 1226 04/27/2022 0507 Full Code [id number]  Bennie Dallas, MD HOV   07/31/2017 1043 08/03/2017 2131 Full Code [id number]  Reymundo Poll, MD Inpatient       Prognosis:  < 2 weeks  Discharge Planning: Hospice facility  Care plan was discussed with wife  Thank you for allowing the Palliative Medicine Team to assist in the care of this patient.  mod MDM     Greater than 50%  of this time was spent counseling and coordinating care related to the above assessment and plan.  Rosalin Hawking, MD  Please contact Palliative Medicine Team phone at [phone] for questions and concerns.

## 2023-06-03 NOTE — Progress Notes (Signed)
 OT Cancellation Note  Patient Details Name: Alex Cortez MRN: [id number] DOB: [birth year]   Cancelled Treatment:    Reason Eval/Treat Not Completed: Other (comment) pt going hospice per chart review, will s/o for OT.   Letti Towell OT/L Acute Rehabilitation Department  [phone]  06/03/2023, 11:26 AM

## 2023-06-04 DIAGNOSIS — N179 Acute kidney failure, unspecified: Secondary | ICD-10-CM | POA: Diagnosis not present

## 2023-06-04 LAB — GLUCOSE, CAPILLARY
Glucose-Capillary: 116 mg/dL — ABNORMAL HIGH (ref 70–99)
Glucose-Capillary: 94 mg/dL (ref 70–99)
Glucose-Capillary: 125 mg/dL — ABNORMAL HIGH (ref 70–99)
Glucose-Capillary: 88 mg/dL (ref 70–99)

## 2023-06-04 NOTE — Progress Notes (Signed)
 Daily Progress Note   Patient Name: Alex Cortez       Date: 06/04/2023 DOB: Oct 05, 1950  Age: 73 y.o. MRN#: [id number] Attending Physician: Debarah Crape, DO Primary Care Physician: Juliette Alcide, MD Admit Date: 05/25/2023  Reason for Consultation/Follow-up: Establishing goals of care  Subjective: Confused, appears comfortable at present,    Wife at bedside.  Length of Stay: 8  Current Medications: Scheduled Meds:   allopurinol  300 mg Oral Daily   carvedilol  12.5 mg Oral BID WC   clopidogrel  75 mg Oral Daily   folic acid  1 mg Oral Daily   insulin aspart  0-20 Units Subcutaneous TID WC   levothyroxine  75 mcg Oral Q0600   tamsulosin  0.4 mg Oral Daily    Continuous Infusions:    PRN Meds: acetaminophen **OR** acetaminophen, haloperidol **OR** haloperidol lactate, lidocaine-prilocaine, magnesium hydroxide, nitroGLYCERIN, ondansetron **OR** ondansetron (ZOFRAN) IV, traZODone  Physical Exam         Elderly appearing African-American gentleman resting in bed Confused Appears in mild distress Has regular work of breathing Does not have lower extremity edema  Vital Signs: BP (!) 121/56   Pulse 66   Temp 98.4 F (36.9 C)   Resp 20   Ht 6\' 3"  (1.905 m)   Wt 103.1 kg   SpO2 99%   BMI 28.41 kg/m  SpO2: SpO2: 99 % O2 Device: O2 Device: Room Air O2 Flow Rate: O2 Flow Rate (L/min): 2 L/min  Intake/output summary:  Intake/Output Summary (Last 24 hours) at 06/04/2023 1230 Last data filed at 06/04/2023 0900 Gross per 24 hour  Intake 565 ml  Output 275 ml  Net 290 ml   LBM: Last BM Date : 06/03/23 Baseline Weight: Weight: 112 kg Most recent weight: Weight: 103.1 kg       Palliative Assessment/Data:      Patient Active Problem List   Diagnosis Date  Noted   Dehydration 06/02/2023   AKI (acute kidney injury) (HCC) 06/02/2023   Meningioma (HCC) 05/26/2023   Pelvic mass 05/26/2023   Acute kidney injury (HCC) 05/25/2023   Chronic diastolic CHF (congestive heart failure) (HCC) 11/17/2022   Coronary artery disease 11/17/2022   Insulin long-term use (HCC) 08/10/2022   Port-A-Cath in place 05/05/2022   DLBCL (diffuse large B cell lymphoma) (HCC) 04/23/2022  Hypothyroidism 01/12/2018   History of CVA with residual deficit    Essential hypertension    Type 2 diabetes mellitus with obesity (HCC)    Sedentary lifestyle 09/09/2015   Mixed hyperlipidemia 02/24/2015   Exertional dyspnea 10/18/2012   Diastolic heart failure (HCC) 10/01/2011   Obstructive sleep apnea 09/02/2011   LV dysfunction 09/02/2011   ERECTILE DYSFUNCTION, SECONDARY TO MEDICATION 04/14/2010   Type 2 diabetes mellitus with vascular disease (HCC) 10/02/2009   CORONARY ATHEROSCLEROSIS NATIVE CORONARY ARTERY 10/02/2009    Palliative Care Assessment & Plan   Patient Profile:    Assessment: 73 year old gentleman with medical history significant for diffuse large B-cell lymphoma, admitted for altered mental status.  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  Lumbar puncture and biopsy of abdominal mass confirms recurrence of diffuse large B-cell lymphoma.  Recommendations/Plan: DNR DNI Comfort care Transfer to residential hospice [facility] consult appreciated.    End of  life signs and symptoms explained to wife and offered support.     Goals of Care and Additional Recommendations: Limitations on Scope of Treatment: Full Comfort Care  Code Status:    Code Status Orders  (From admission, onward)           Start     Ordered   06/02/23 1354  Do not attempt resuscitation (DNR) - Comfort care  (Code Status)  Continuous       Question Answer Comment  If patient has no pulse and is not breathing Do Not Attempt Resuscitation    In Pre-Arrest Conditions (Patient Is Breathing and Has a Pulse) Provide comfort measures. Relieve any mechanical airway obstruction. Avoid transfer unless required for comfort.   Consent: Discussion documented in EHR or advanced directives reviewed      06/02/23 1353           Code Status History     Date Active Date Inactive Code Status Order ID Comments User Context   05/26/2023 0009 06/02/2023 1353 Full Code [id number]  [facility] Vernetta Honey, MD Inpatient   11/17/2022 0220 11/18/2022 1834 Full Code [id number]  [facility] Vernetta Honey, MD Inpatient   07/11/2022 0736 07/14/2022 1848 Full Code [id number]  Vassie Loll, MD Inpatient   04/26/2022 1226 04/27/2022 0507 Full Code [id number]  Bennie Dallas, MD HOV   07/31/2017 1043 08/03/2017 2131 Full Code [id number]  Reymundo Poll, MD Inpatient       Prognosis:  < 2 weeks  Discharge Planning: Hospice facility  Care plan was discussed with wife  Thank you for allowing the Palliative Medicine Team to assist in the care of this patient.  mod MDM     Greater than 50%  of this time was spent counseling and coordinating care related to the above assessment and plan.  Rosalin Hawking, MD  Please contact Palliative Medicine Team phone at [phone] for questions and concerns.

## 2023-06-04 NOTE — TOC Progression Note (Signed)
 Transition of Care [facility]) - Progression Note    Patient Details  Name: Alex Cortez MRN: [id number] Date of Birth: 08-26-1950  Transition of [facility]) CM/SW Contact  Adrian Prows, RN Phone Number: 06/04/2023, 2:11 PM  Clinical Narrative:    Burton Apley for update on status of bed and pt assessment; spoke w/ Lelon Mast; she says she will have on call RN return call.  -[phone]- Return call from D'Lo, RN from agency; she says no beds available at [facility] today, but bed may be available on Monday; she also says she has spoken w/ pt's wife, and assessment scheduled for Monday around 0930-1000; TOC is following.   Expected Discharge Plan: Skilled Nursing Facility Barriers to Discharge: Continued Medical Work up  Expected Discharge Plan and Services In-house Referral: Clinical Social Work   Post Acute Care Choice: Durable Medical Equipment Living arrangements for the past 2 months: Single Family Home                                       Social Determinants of Health (SDOH) Interventions SDOH Screenings   Food Insecurity: Patient Unable To Answer (05/26/2023)  Housing: Low Risk  (05/26/2023)  Transportation Needs: Patient Unable To Answer (05/26/2023)  Utilities: Patient Unable To Answer (05/26/2023)  Depression (PHQ2-9): Low Risk  (04/07/2022)  Financial Resource Strain: Low Risk  (01/03/2023)  Physical Activity: Inactive (11/22/2022)  Social Connections: Unknown (05/26/2023)  Stress: No Stress Concern Present (04/07/2022)  Tobacco Use: Medium Risk (05/25/2023)  Health Literacy: Adequate Health Literacy (12/06/2022)    Readmission Risk Interventions    05/31/2023   10:09 AM 11/17/2022   10:57 AM 07/14/2022   12:04 PM  Readmission Risk Prevention Plan  Transportation Screening Complete Complete Complete  PCP or Specialist Appt within 5-7 Days Complete    Home Care Screening Complete    Medication Review (RN CM) Complete    HRI or Home Care Consult   Complete Complete  Social Work Consult for Recovery Care Planning/Counseling  Complete Complete  Palliative Care Screening  Not Applicable Not Applicable  Medication Review Oceanographer)  Complete Complete

## 2023-06-04 NOTE — Progress Notes (Signed)
 PROGRESS NOTE    Alex Cortez  WUJ:[id number] DOB: Mar 26, 1950 DOA: 05/25/2023 PCP: Juliette Alcide, MD  Chief Complaint  Patient presents with   Weakness    Hospital Course:  Alex Cortez is 73 y.o. male with B cell lymphoma CAD, type 2 diabetes, hypertension, history of CVA, history of frontotemporal meningioma, initially admitted to [facility] for progressive weakness and decreasing ability to walk over the week prior. Workup revealed progressive pericolonic inflammatory stranding and a 3 x 6 soft tissue density within the presacral space.  MRI brain revealed a 6 cm extra-axial mass likely meningioma.  Patient was transferred to [facility] for biopsy of presacral area.  Biopsy resulted for B-cell lymphoma. His confusion became progressively worse.  Brain MRI was performed which was notable for abnormal leptomeningeal enhancement which could reveal infectious or lymphomatous meningitis.  He was also noted to have a 13 mm area of enhancement in the inferior left cerebellum differential including subacute infarct or CNS lymphoma.  Neurology was consulted.  He was initiated on empiric meningitis coverage and underwent LP with notable lymphocytic pleocytosis, elevated CSF protein, normal CSF glucose.  CSF culture and bio fire panel was negative, and antibiotics were de-escalated.  His confusion has persisted despite antibiotics.  Ultimately CSF resulted for B-cell lymphoma. Given recurrence of his B-cell lymphoma, oncology was consulted and discussed directly with [facility] lymphoma team but unfortunately there are no further interventions available to him.  All teams are now recommending hospice and palliative care.  Subjective: No acute events overnight.  His wife is at bedside.  Patient appears comfortable.  Ate his breakfast today.  He is alert and oriented to self and some of the situation though has intermittent episodes of confusion.  Objective: Vitals:   06/03/23 1728 06/03/23  1747 06/03/23 2226 06/04/23 0536  BP: 117/63 (!) 117/57 132/64 133/64  Pulse: 65 65 67 67  Resp: 20  18 20   Temp: 98.6 F (37 C)  98.6 F (37 C) 98.4 F (36.9 C)  TempSrc: Oral  Axillary   SpO2: 99%  98% 99%  Weight:      Height:        Intake/Output Summary (Last 24 hours) at 06/04/2023 0857 Last data filed at 06/04/2023 0300 Gross per 24 hour  Intake 435 ml  Output 275 ml  Net 160 ml   Filed Weights   05/25/23 1746 05/26/23 0002  Weight: 112 kg 103.1 kg    Examination: General exam: Appears calm and comfortable, NAD  Respiratory system: No work of breathing, symmetric chest wall expansion Cardiovascular system: S1 & S2 heard, RRR.  Gastrointestinal system: Abdomen is nondistended, soft and nontender.  Neuro: Oriented to self and situation. Intermittently confused. Extremities: Symmetric, expected ROM Skin: No rashes, lesions Psychiatry: Calm, redirectable.  Assessment & Plan:  Principal Problem:   Acute kidney injury (HCC) Active Problems:   Meningioma (HCC)   Pelvic mass   Type 2 diabetes mellitus with vascular disease (HCC)   History of CVA with residual deficit   Essential hypertension   DLBCL (diffuse large B cell lymphoma) (HCC)   Chronic diastolic CHF (congestive heart failure) (HCC)   Dehydration   AKI (acute kidney injury) (HCC)   Comfort measures only - Given recent diagnosis of diffuse large B-cell lymphoma recurrence, patient no longer has curative treatment options.  After multiple discussions with multiple family members and assistance of palliative care team, they have decided to proceed with comfort measures only and are seeking  hospice care outpatient. - TOC has been consulted to assist in these arrangements - In the interim we will discontinue all medications that do not immediately provide the patient comfort - qShift vitals, no lab draws - Continue to monitor for symptomatic needs.  Continue with current pain medications, titrate as needed -  Liberalize diet for pleasure feeds. - TOC is working on outpatient hospice arrangements.  No bed available today.  Hopefully on Monday.  Will continue to treat as comfort measures only while admitted. - Discussed prognosis and medical management again with his wife at bedside today.  Diffuse large B-cell lymphoma with recurrence - MRI brain 3/13: Abnormal leptomeningeal enhancement which could be infectious or lymphomatous - Lumbar puncture 3/14 path + B Cell lymphoma  - Oncology consulted and following.  Oncology has discussed directly with [facility] lymphoma team and unfortunately there is no further intervention.  Recommending palliative care/hospice. Acute encephalopathy - Initially presumed to be infectious, CSF studies have been negative. Likely 2/2 to CNS Lymphoma -- LP path consistent with lymphoma - Was initially started on ampicillin, has since been discontinued - Neurology consulted - MRI brain with multiple abnormalities: Abnormal leptomeningeal enhancement which could be infectious or lymphomatous - Workup broadened with B12, folate, TSH, thiamine, RPR, HIV.  Studies have been largely within normal limits.  Folate low.  Thiamine pending.  Continue folic acid supplementation. - EEG: Suggestive of diffuse encephalopathy.  No seizures or epileptiform discharges Presacral mass, Consistent with B-cell lymphoproliferative disorder. - 3 x 5 cm soft tissue density in presacral area status post biopsy with interventional radiology - Results: Consistent with B-cell lymphoproliferative disorder. - Oncology is following, appreciate recs Epididymitis Scrotal edema - Prior to arrival. Seen on scrotal ultrasound prior to hospitalization.  Mass is likely interfering with drainage - Antibiotics treating for infectious sources as above -  Continue anti-inflammatory, elevation, tamsulosin. Discussed with RN - Cont to monitor for retention.  Currently voiding without issue AKI - Resolved with  IV fluids Staph hemolyticus bacteremia - 1 out of 2 blood cultures positive.  Unclear if true infection vs contaminent.  Repeat blood cultures 3/18 negative to date - Has been on ceftriaxone x 6 days.  Will discontinue in light of comfort measures only status. - Echocardiogram without evidence of valvular vegetations Heart failure with preserved EF CAD Hypertension - Echocardiogram 3/15: LVEF 55 to 60%, no regional wall motion abnormalities, grade 1 diastolic dysfunction. - As needed Lasix, Clinically euvolemic at this time - Continue current meds - Continue Plavix History of CVA - Resume home meds including Plavix. Stop stain and Zetia  Type 2 diabetes BMI 28   DVT prophylaxis: n/a as CMO   Code Status: Do not attempt resuscitation (DNR) - Comfort care Family Communication:  Discussed directly with patient's wife  at bedside Disposition:  Inpatient, TOC consulted for hospice arrangements. DC when this has been arranged   Consultants:  Treatment Team:  Consulting Physician: Jaci Standard, MD  Procedures:    Antimicrobials:  Anti-infectives (From admission, onward)    Start     Dose/Rate Route Frequency Ordered Stop   06/01/23 1000  cefTRIAXone (ROCEPHIN) 2 g in sodium chloride 0.9 % 100 mL IVPB  Status:  Discontinued        2 g 200 mL/hr over 30 Minutes Intravenous Every 24 hours 05/31/23 0956 06/02/23 1555   05/29/23 1500  vancomycin (VANCOREADY) IVPB 750 mg/150 mL  Status:  Discontinued        750 mg  150 mL/hr over 60 Minutes Intravenous Every 12 hours 05/29/23 1019 05/30/23 1248   05/28/23 1500  vancomycin (VANCOCIN) IVPB 1000 mg/200 mL premix  Status:  Discontinued        1,000 mg 200 mL/hr over 60 Minutes Intravenous Every 12 hours 05/28/23 0157 05/28/23 1137   05/28/23 1500  vancomycin (VANCOREADY) IVPB 1250 mg/250 mL  Status:  Discontinued        1,250 mg 166.7 mL/hr over 90 Minutes Intravenous Every 12 hours 05/28/23 1137 05/29/23 1019   05/28/23 0400   cefTRIAXone (ROCEPHIN) 2 g in sodium chloride 0.9 % 100 mL IVPB  Status:  Discontinued        2 g 200 mL/hr over 30 Minutes Intravenous Every 12 hours 05/28/23 0308 05/31/23 0956   05/28/23 0400  ampicillin (OMNIPEN) 2 g in sodium chloride 0.9 % 100 mL IVPB  Status:  Discontinued        2 g 300 mL/hr over 20 Minutes Intravenous Every 4 hours 05/28/23 0309 05/31/23 0728   05/28/23 0245  vancomycin (VANCOREADY) IVPB 2000 mg/400 mL        2,000 mg 200 mL/hr over 120 Minutes Intravenous  Once 05/28/23 0155 05/28/23 0446   05/26/23 1800  cefTRIAXone (ROCEPHIN) 2 g in sodium chloride 0.9 % 100 mL IVPB  Status:  Discontinued        2 g 200 mL/hr over 30 Minutes Intravenous Every 24 hours 05/26/23 0350 05/26/23 1354   05/26/23 0445  cefTRIAXone (ROCEPHIN) 2 g in sodium chloride 0.9 % 100 mL IVPB  Status:  Discontinued        2 g 200 mL/hr over 30 Minutes Intravenous Every 24 hours 05/26/23 0351 05/26/23 0406   05/25/23 1830  cefTRIAXone (ROCEPHIN) 2 g in sodium chloride 0.9 % 100 mL IVPB        2 g 200 mL/hr over 30 Minutes Intravenous Once 05/25/23 1822 05/25/23 1932       Data Reviewed: I have personally reviewed following labs and imaging studies CBC: Recent Labs  Lab 06/01/23 0436  WBC 4.3  HGB 11.5*  HCT 37.4*  MCV 84.6  PLT 122*   Basic Metabolic Panel: Recent Labs  Lab 06/01/23 0436  NA 139  K 3.7  CL 112*  CO2 15*  GLUCOSE 82  BUN 24*  CREATININE 1.20  CALCIUM 8.6*   GFR: Estimated Creatinine Clearance: 71.3 mL/min (by C-G formula based on SCr of 1.2 mg/dL). Liver Function Tests: Recent Labs  Lab 06/01/23 0436  AST 36  ALT 38  ALKPHOS 41  BILITOT 0.7  PROT 5.7*  ALBUMIN 2.6*   CBG: Recent Labs  Lab 06/03/23 1118 06/03/23 1659 06/03/23 2221 06/04/23 0542 06/04/23 0757  GLUCAP 109* 126* 95 116* 94    Recent Results (from the past 240 hours)  Resp panel by RT-PCR (RSV, Flu A&B, Covid) Anterior Nasal Swab     Status: None   Collection Time: 05/25/23   6:30 PM   Specimen: Anterior Nasal Swab  Result Value Ref Range Status   SARS Coronavirus 2 by RT PCR NEGATIVE NEGATIVE Final    Comment: (NOTE) SARS-CoV-2 target nucleic acids are NOT DETECTED.  The SARS-CoV-2 RNA is generally detectable in upper respiratory specimens during the acute phase of infection. The lowest concentration of SARS-CoV-2 viral copies this assay can detect is 138 copies/mL. A negative result does not preclude SARS-Cov-2 infection and should not be used as the sole basis for treatment or other patient management decisions. A  negative result may occur with  improper specimen collection/handling, submission of specimen other than nasopharyngeal swab, presence of viral mutation(s) within the areas targeted by this assay, and inadequate number of viral copies(<138 copies/mL). A negative result must be combined with clinical observations, patient history, and epidemiological information. The expected result is Negative.  Fact Sheet for Patients:  BloggerCourse.com  Fact Sheet for Healthcare Providers:  SeriousBroker.it  This test is no t yet approved or cleared by the [place] FDA and  has been authorized for detection and/or diagnosis of SARS-CoV-2 by FDA under an Emergency Use Authorization (EUA). This EUA will remain  in effect (meaning this test can be used) for the duration of the COVID-19 declaration under Section 564(b)(1) of the Act, 21 U.S.C.section 360bbb-3(b)(1), unless the authorization is terminated  or revoked sooner.       Influenza A by PCR NEGATIVE NEGATIVE Final   Influenza B by PCR NEGATIVE NEGATIVE Final    Comment: (NOTE) The Xpert Xpress SARS-CoV-2/FLU/RSV plus assay is intended as an aid in the diagnosis of influenza from Nasopharyngeal swab specimens and should not be used as a sole basis for treatment. Nasal washings and aspirates are unacceptable for Xpert Xpress  SARS-CoV-2/FLU/RSV testing.  Fact Sheet for Patients: BloggerCourse.com  Fact Sheet for Healthcare Providers: SeriousBroker.it  This test is not yet approved or cleared by the [place] FDA and has been authorized for detection and/or diagnosis of SARS-CoV-2 by FDA under an Emergency Use Authorization (EUA). This EUA will remain in effect (meaning this test can be used) for the duration of the COVID-19 declaration under Section 564(b)(1) of the Act, 21 U.S.C. section 360bbb-3(b)(1), unless the authorization is terminated or revoked.     Resp Syncytial Virus by PCR NEGATIVE NEGATIVE Final    Comment: (NOTE) Fact Sheet for Patients: BloggerCourse.com  Fact Sheet for Healthcare Providers: SeriousBroker.it  This test is not yet approved or cleared by the [place] FDA and has been authorized for detection and/or diagnosis of SARS-CoV-2 by FDA under an Emergency Use Authorization (EUA). This EUA will remain in effect (meaning this test can be used) for the duration of the COVID-19 declaration under Section 564(b)(1) of the Act, 21 U.S.C. section 360bbb-3(b)(1), unless the authorization is terminated or revoked.  Performed at [facility], [address], [place], [place] [address]   Blood Culture (routine x 2)     Status: None   Collection Time: 05/25/23  7:56 PM   Specimen: BLOOD  Result Value Ref Range Status   Specimen Description BLOOD BLOOD RIGHT HAND  Final   Special Requests   Final    BOTTLES DRAWN AEROBIC AND ANAEROBIC Blood Culture results may not be optimal due to an inadequate volume of blood received in culture bottles   Culture   Final    NO GROWTH 5 DAYS Performed at [facility], [address], [place], [place] [address]    Report Status 05/30/2023 FINAL  Final  Blood Culture (routine x 2)     Status: Abnormal   Collection Time: 05/25/23  7:59 PM    Specimen: Left Antecubital; Blood  Result Value Ref Range Status   Specimen Description   Final    LEFT ANTECUBITAL BOTTLES DRAWN AEROBIC AND ANAEROBIC Performed at [facility], [address], [place], [place] [address]    Special Requests   Final    Blood Culture results may not be optimal due to an inadequate volume of blood received in culture bottles Performed at [facility], [address] [place], [place] [address]    Culture  Setup Time   Final    GRAM POSITIVE COCCI Gram Stain Report Called to,Read Back By and Verified With: BROWN,K @ 0112 ON 05/28/23 BY JUW AEROBIC BOTTLE ONLY GS DONE @ [facility] CRITICAL RESULT CALLED TO, READ BACK BY AND VERIFIED WITH: M BELL,PHARMD@0737  05/28/23 MK    Culture (A)  Final    STAPHYLOCOCCUS HAEMOLYTICUS THE SIGNIFICANCE OF ISOLATING THIS ORGANISM FROM A SINGLE SET OF BLOOD CULTURES WHEN MULTIPLE SETS ARE DRAWN IS UNCERTAIN. PLEASE NOTIFY THE MICROBIOLOGY DEPARTMENT WITHIN ONE WEEK IF SPECIATION AND SENSITIVITIES ARE REQUIRED. Performed at [facility], [address], [place], [place] [address]    Report Status 05/29/2023 FINAL  Final  Blood Culture ID Panel (Reflexed)     Status: Abnormal   Collection Time: 05/25/23  7:59 PM  Result Value Ref Range Status   Enterococcus faecalis NOT DETECTED NOT DETECTED Final   Enterococcus Faecium NOT DETECTED NOT DETECTED Final   Listeria monocytogenes NOT DETECTED NOT DETECTED Final   Staphylococcus species DETECTED (A) NOT DETECTED Final    Comment: CRITICAL RESULT CALLED TO, READ BACK BY AND VERIFIED WITH: M BELL,PHARMD@0737  05/28/23 MK    Staphylococcus aureus (BCID) NOT DETECTED NOT DETECTED Final   Staphylococcus epidermidis NOT DETECTED NOT DETECTED Final   Staphylococcus lugdunensis NOT DETECTED NOT DETECTED Final   Streptococcus species NOT DETECTED NOT DETECTED Final   Streptococcus agalactiae NOT DETECTED NOT DETECTED Final   Streptococcus pneumoniae NOT DETECTED NOT DETECTED Final    Streptococcus pyogenes NOT DETECTED NOT DETECTED Final   A.calcoaceticus-baumannii NOT DETECTED NOT DETECTED Final   Bacteroides fragilis NOT DETECTED NOT DETECTED Final   Enterobacterales NOT DETECTED NOT DETECTED Final   Enterobacter cloacae complex NOT DETECTED NOT DETECTED Final   Escherichia coli NOT DETECTED NOT DETECTED Final   Klebsiella aerogenes NOT DETECTED NOT DETECTED Final   Klebsiella oxytoca NOT DETECTED NOT DETECTED Final   Klebsiella pneumoniae NOT DETECTED NOT DETECTED Final   Proteus species NOT DETECTED NOT DETECTED Final   Salmonella species NOT DETECTED NOT DETECTED Final   Serratia marcescens NOT DETECTED NOT DETECTED Final   Haemophilus influenzae NOT DETECTED NOT DETECTED Final   Neisseria meningitidis NOT DETECTED NOT DETECTED Final   Pseudomonas aeruginosa NOT DETECTED NOT DETECTED Final   Stenotrophomonas maltophilia NOT DETECTED NOT DETECTED Final   Candida albicans NOT DETECTED NOT DETECTED Final   Candida auris NOT DETECTED NOT DETECTED Final   Candida glabrata NOT DETECTED NOT DETECTED Final   Candida krusei NOT DETECTED NOT DETECTED Final   Candida parapsilosis NOT DETECTED NOT DETECTED Final   Candida tropicalis NOT DETECTED NOT DETECTED Final   Cryptococcus neoformans/gattii NOT DETECTED NOT DETECTED Final    Comment: Performed at [facility], [address] [place], [place] [address]  CSF culture w Gram Stain     Status: None   Collection Time: 05/27/23  4:10 PM   Specimen: CSF  Result Value Ref Range Status   Specimen Description   Final    CSF LP Performed at [facility], [address] [place], [place] [address]    Special Requests   Final    Immunocompromised Performed at [facility], [address] [place], [place] [address]    Gram Stain   Final    WBC PRESENT, PREDOMINANTLY MONONUCLEAR NO ORGANISMS SEEN CYTOSPIN SMEAR Gram Stain Report Called to,Read Back By and Verified With:  Cloria Spring RN Performed at [facility], [address] [place], [place] [address]    Culture   Final    NO GROWTH 3 DAYS Performed at [facility], [address] [place], [place] [address]    Report Status 05/31/2023 FINAL  Final  Culture, blood (Routine X 2) w Reflex to ID Panel     Status: None (Preliminary result)   Collection Time: 05/31/23  6:11 PM   Specimen: BLOOD  Result Value Ref Range Status   Specimen Description   Final    BLOOD BLOOD LEFT HAND Performed at [facility], [address], [place], [place] [address]    Special Requests   Final    BOTTLES DRAWN AEROBIC ONLY Blood Culture results may not be optimal due to an inadequate volume of blood received in culture bottles Performed at [facility], [address], [place], [place] [address]    Culture   Final    NO GROWTH 4 DAYS Performed at [facility], [address], [place], [place] [address]    Report Status PENDING  Incomplete  Culture, blood (Routine X 2) w Reflex to ID Panel     Status: None (Preliminary result)   Collection Time: 05/31/23  9:03 PM   Specimen: BLOOD  Result Value Ref Range Status   Specimen Description   Final    BLOOD BLOOD RIGHT HAND Performed at [facility], [address] [place], [place] [address]    Special Requests   Final    BOTTLES DRAWN AEROBIC AND ANAEROBIC Blood Culture results may not be optimal due to an inadequate volume of blood received in culture bottles Performed at [facility], [address], [place], [place] [address]    Culture   Final    NO GROWTH 4 DAYS Performed at [facility], [address] [place], [place] [address]    Report Status PENDING  Incomplete     Radiology Studies: No results found.   Scheduled Meds:  allopurinol  300 mg Oral Daily   carvedilol  12.5 mg Oral BID WC   clopidogrel  75 mg Oral Daily   folic acid  1 mg Oral  Daily   insulin aspart  0-20 Units Subcutaneous TID WC   levothyroxine  75 mcg Oral Q0600   tamsulosin  0.4 mg Oral Daily   Continuous Infusions:     LOS: 8 days    Debarah Crape, DO [facility]  To contact the attending physician between 7A-7P please use Epic Chat. To contact the covering physician during after hours 7P-7A, please review Amion.   06/04/2023, 8:57 AM   *This document has been created with the assistance of dictation software. Please excuse typographical errors. *

## 2023-06-04 NOTE — Plan of Care (Signed)

## 2023-06-04 NOTE — Plan of Care (Signed)
   Problem: Nutrition: Goal: Adequate nutrition will be maintained Outcome: Progressing

## 2023-06-05 DIAGNOSIS — Z515 Encounter for palliative care: Secondary | ICD-10-CM | POA: Diagnosis not present

## 2023-06-05 DIAGNOSIS — Z7189 Other specified counseling: Secondary | ICD-10-CM | POA: Diagnosis not present

## 2023-06-05 DIAGNOSIS — N179 Acute kidney failure, unspecified: Secondary | ICD-10-CM | POA: Diagnosis not present

## 2023-06-05 LAB — GLUCOSE, CAPILLARY
Glucose-Capillary: 76 mg/dL (ref 70–99)
Glucose-Capillary: 99 mg/dL (ref 70–99)
Glucose-Capillary: 106 mg/dL — ABNORMAL HIGH (ref 70–99)

## 2023-06-05 LAB — CULTURE, BLOOD (ROUTINE X 2)
Culture: NO GROWTH
Culture: NO GROWTH

## 2023-06-05 NOTE — Progress Notes (Signed)
 PROGRESS NOTE    Alex Cortez  VHQ:[id number] DOB: [birth year] DOA: 05/25/2023 PCP: Juliette Alcide, MD  Chief Complaint  Patient presents with   Weakness    Hospital Course:  Alex Cortez is 73 y.o. male with B cell lymphoma CAD, type 2 diabetes, hypertension, history of CVA, history of frontotemporal meningioma, initially admitted to [facility] for progressive weakness and decreasing ability to walk over the week prior. Workup revealed progressive pericolonic inflammatory stranding and a 3 x 6 soft tissue density within the presacral space.  MRI brain revealed a 6 cm extra-axial mass likely meningioma.  Patient was transferred to [facility] for biopsy of presacral area.  Biopsy resulted for B-cell lymphoma. His confusion became progressively worse.  Brain MRI was performed which was notable for abnormal leptomeningeal enhancement which could reveal infectious or lymphomatous meningitis.  He was also noted to have a 13 mm area of enhancement in the inferior left cerebellum differential including subacute infarct or CNS lymphoma.  Neurology was consulted.  He was initiated on empiric meningitis coverage and underwent LP with notable lymphocytic pleocytosis, elevated CSF protein, normal CSF glucose.  CSF culture and bio fire panel was negative, and antibiotics were de-escalated.  His confusion has persisted despite antibiotics.  Ultimately CSF resulted for B-cell lymphoma. Given recurrence of his B-cell lymphoma, oncology was consulted and discussed directly with [facility] lymphoma team but unfortunately there are no further interventions available to him.  All teams are now recommending hospice and palliative care.  Subjective: No acute events overnight. On evaluation today patient is comfortable.  No issues overnight.   Objective: Vitals:   06/04/23 1742 06/04/23 1952 06/05/23 0420 06/05/23 0939  BP: (!) 134/59 133/68 (!) 142/62 (!) 143/54  Pulse: 68 66 69 64  Resp:  16 18    Temp:   98.6 F (37 C)   TempSrc:      SpO2:  99% 98%   Weight:      Height:        Intake/Output Summary (Last 24 hours) at 06/05/2023 1022 Last data filed at 06/04/2023 1653 Gross per 24 hour  Intake 160 ml  Output --  Net 160 ml   Filed Weights   05/25/23 1746 05/26/23 0002  Weight: 112 kg 103.1 kg    Examination: General exam: Appears calm and comfortable, NAD  Respiratory system: No work of breathing, symmetric chest wall expansion Cardiovascular system: S1 & S2 heard, RRR.  Gastrointestinal system: Abdomen is nondistended, soft and nontender. GU: Scrotal and penile swelling persistent, Neuro: Oriented to self and situation. Intermittently confused. Extremities: Symmetric, expected ROM Skin: No rashes, lesions Psychiatry: Calm, redirectable.  Assessment & Plan:  Principal Problem:   Acute kidney injury (HCC) Active Problems:   Meningioma (HCC)   Pelvic mass   Type 2 diabetes mellitus with vascular disease (HCC)   History of CVA with residual deficit   Essential hypertension   DLBCL (diffuse large B cell lymphoma) (HCC)   Chronic diastolic CHF (congestive heart failure) (HCC)   Dehydration   AKI (acute kidney injury) (HCC)  Comfort measures only - Given recent diagnosis of diffuse large B-cell lymphoma recurrence, patient no longer has curative treatment options.  After multiple discussions with multiple family members and assistance of palliative care team, they have decided to proceed with comfort measures only and are seeking hospice care outpatient. - TOC has been consulted to assist in these arrangements - In the interim we will discontinue all medications that do  not immediately provide the patient comfort - qShift vitals, no lab draws - Continue to monitor for symptomatic needs.  Continue to titrate pain meds as needed - Liberalize diet for pleasure feeds. - TOC is working on outpatient hospice arrangements.  No bed available today.  Hopefully on Monday.   Will continue to treat as comfort measures only while admitted. - Discussed prognosis and medical management again with his wife at bedside today.  Diffuse large B-cell lymphoma with recurrence - MRI brain 3/13: Abnormal leptomeningeal enhancement which could be infectious or lymphomatous - Lumbar puncture 3/14 path + B Cell lymphoma  - Oncology consulted and following.  Oncology has discussed directly with [facility] lymphoma team and unfortunately there is no further intervention.  Recommending palliative care/hospice. Acute encephalopathy - Initially presumed to be infectious, CSF studies have been negative. Likely 2/2 to CNS Lymphoma -- LP path consistent with lymphoma - Was initially started on ampicillin, has since been discontinued - Neurology consulted - MRI brain with multiple abnormalities: Abnormal leptomeningeal enhancement which could be infectious or lymphomatous - Workup broadened with B12, folate, TSH, thiamine, RPR, HIV.  Studies have been largely within normal limits.  Folate low.  Thiamine pending.  Continue folic acid supplementation. - EEG: Suggestive of diffuse encephalopathy.  No seizures or epileptiform discharges Presacral mass, Consistent with B-cell lymphoproliferative disorder. - 3 x 5 cm soft tissue density in presacral area status post biopsy with interventional radiology - Results: Consistent with B-cell lymphoproliferative disorder. - Oncology is following, appreciate recs Epididymitis Scrotal edema - Prior to arrival. Seen on scrotal ultrasound prior to hospitalization.  Mass is likely interfering with drainage - Antibiotics treating for infectious sources as above -  Continue anti-inflammatory, elevation, tamsulosin. Discussed with RN - Continue to monitor for retention, bedside RN reports no issues with voiding.  Will place Foley catheter for comfort if developing concerns of obstruction. AKI - Resolved with IV fluids - Stop trending Staph hemolyticus  bacteremia - 1 out of 2 blood cultures positive.  Unclear if true infection vs contaminent.  Repeat blood cultures 3/18 negative to date - Has been on ceftriaxone x 6 days.  Further antibiotics now discontinued in light of comfort measures only status. - Echocardiogram without evidence of valvular vegetations Heart failure with preserved EF CAD Hypertension - Echocardiogram 3/15: LVEF 55 to 60%, no regional wall motion abnormalities, grade 1 diastolic dysfunction. - As needed Lasix, Clinically euvolemic at this time - Continue Plavix History of CVA -Continue Plavix. Stop stain and Zetia  Type 2 diabetes BMI 28   DVT prophylaxis: n/a as CMO   Code Status: Do not attempt resuscitation (DNR) - Comfort care Family Communication: None at bedside today. Disposition:  Inpatient, TOC consulted for hospice arrangements. DC when this has been arranged   Consultants:  Treatment Team:  Consulting Physician: Jaci Standard, MD  Procedures:    Antimicrobials:  Anti-infectives (From admission, onward)    Start     Dose/Rate Route Frequency Ordered Stop   06/01/23 1000  cefTRIAXone (ROCEPHIN) 2 g in sodium chloride 0.9 % 100 mL IVPB  Status:  Discontinued        2 g 200 mL/hr over 30 Minutes Intravenous Every 24 hours 05/31/23 0956 06/02/23 1555   05/29/23 1500  vancomycin (VANCOREADY) IVPB 750 mg/150 mL  Status:  Discontinued        750 mg 150 mL/hr over 60 Minutes Intravenous Every 12 hours 05/29/23 1019 05/30/23 1248   05/28/23 1500  vancomycin (  VANCOCIN) IVPB 1000 mg/200 mL premix  Status:  Discontinued        1,000 mg 200 mL/hr over 60 Minutes Intravenous Every 12 hours 05/28/23 0157 05/28/23 1137   05/28/23 1500  vancomycin (VANCOREADY) IVPB 1250 mg/250 mL  Status:  Discontinued        1,250 mg 166.7 mL/hr over 90 Minutes Intravenous Every 12 hours 05/28/23 1137 05/29/23 1019   05/28/23 0400  cefTRIAXone (ROCEPHIN) 2 g in sodium chloride 0.9 % 100 mL IVPB  Status:  Discontinued         2 g 200 mL/hr over 30 Minutes Intravenous Every 12 hours 05/28/23 0308 05/31/23 0956   05/28/23 0400  ampicillin (OMNIPEN) 2 g in sodium chloride 0.9 % 100 mL IVPB  Status:  Discontinued        2 g 300 mL/hr over 20 Minutes Intravenous Every 4 hours 05/28/23 0309 05/31/23 0728   05/28/23 0245  vancomycin (VANCOREADY) IVPB 2000 mg/400 mL        2,000 mg 200 mL/hr over 120 Minutes Intravenous  Once 05/28/23 0155 05/28/23 0446   05/26/23 1800  cefTRIAXone (ROCEPHIN) 2 g in sodium chloride 0.9 % 100 mL IVPB  Status:  Discontinued        2 g 200 mL/hr over 30 Minutes Intravenous Every 24 hours 05/26/23 0350 05/26/23 1354   05/26/23 0445  cefTRIAXone (ROCEPHIN) 2 g in sodium chloride 0.9 % 100 mL IVPB  Status:  Discontinued        2 g 200 mL/hr over 30 Minutes Intravenous Every 24 hours 05/26/23 0351 05/26/23 0406   05/25/23 1830  cefTRIAXone (ROCEPHIN) 2 g in sodium chloride 0.9 % 100 mL IVPB        2 g 200 mL/hr over 30 Minutes Intravenous Once 05/25/23 1822 05/25/23 1932       Data Reviewed: I have personally reviewed following labs and imaging studies CBC: Recent Labs  Lab 06/01/23 0436  WBC 4.3  HGB 11.5*  HCT 37.4*  MCV 84.6  PLT 122*   Basic Metabolic Panel: Recent Labs  Lab 06/01/23 0436  NA 139  K 3.7  CL 112*  CO2 15*  GLUCOSE 82  BUN 24*  CREATININE 1.20  CALCIUM 8.6*   GFR: Estimated Creatinine Clearance: 71.3 mL/min (by C-G formula based on SCr of 1.2 mg/dL). Liver Function Tests: Recent Labs  Lab 06/01/23 0436  AST 36  ALT 38  ALKPHOS 41  BILITOT 0.7  PROT 5.7*  ALBUMIN 2.6*   CBG: Recent Labs  Lab 06/04/23 0542 06/04/23 0757 06/04/23 1221 06/04/23 1547 06/05/23 0631  GLUCAP 116* 94 125* 88 76    Recent Results (from the past 240 hours)  CSF culture w Gram Stain     Status: None   Collection Time: 05/27/23  4:10 PM   Specimen: CSF  Result Value Ref Range Status   Specimen Description   Final    CSF LP Performed at [facility], [address], [place], [place] [address]    Special Requests   Final    Immunocompromised Performed at [facility], [address] [place], [place] [address]    Gram Stain   Final    WBC PRESENT, PREDOMINANTLY MONONUCLEAR NO ORGANISMS SEEN CYTOSPIN SMEAR Gram Stain Report Called to,Read Back By and Verified With: Cloria Spring RN Performed at [facility], [address] [place], [place] [address]    Culture   Final  NO GROWTH 3 DAYS Performed at [facility], [address] [place], [place] [address]    Report Status 05/31/2023 FINAL  Final  Culture, blood (Routine X 2) w Reflex to ID Panel     Status: None   Collection Time: 05/31/23  6:11 PM   Specimen: BLOOD  Result Value Ref Range Status   Specimen Description   Final    BLOOD BLOOD LEFT HAND Performed at [facility], [address] [place], [place] [address]    Special Requests   Final    BOTTLES DRAWN AEROBIC ONLY Blood Culture results may not be optimal due to an inadequate volume of blood received in culture bottles Performed at [facility], [address] [place], [place] [address]    Culture   Final    NO GROWTH 5 DAYS Performed at [facility], [address], [place], [place] [address]    Report Status 06/05/2023 FINAL  Final  Culture, blood (Routine X 2) w Reflex to ID Panel     Status: None   Collection Time: 05/31/23  9:03 PM   Specimen: BLOOD  Result Value Ref Range Status   Specimen Description   Final    BLOOD BLOOD RIGHT HAND Performed at [facility], [address], [place], [place] [address]    Special Requests   Final    BOTTLES DRAWN AEROBIC AND ANAEROBIC Blood Culture results may not be optimal due to an inadequate volume of blood received in culture bottles Performed at [facility], [address] [place], [place] [address]    Culture    Final    NO GROWTH 5 DAYS Performed at [facility], [address] [place], [place] [address]    Report Status 06/05/2023 FINAL  Final     Radiology Studies: No results found.   Scheduled Meds:  allopurinol  300 mg Oral Daily   carvedilol  12.5 mg Oral BID WC   clopidogrel  75 mg Oral Daily   folic acid  1 mg Oral Daily   insulin aspart  0-20 Units Subcutaneous TID WC   levothyroxine  75 mcg Oral Q0600   tamsulosin  0.4 mg Oral Daily   Continuous Infusions:     LOS: 9 days    Debarah Crape, DO [facility]  To contact the attending physician between 7A-7P please use Epic Chat. To contact the covering physician during after hours 7P-7A, please review Amion.   06/05/2023, 10:22 AM   *This document has been created with the assistance of dictation software. Please excuse typographical errors. *

## 2023-06-05 NOTE — Plan of Care (Signed)

## 2023-06-05 NOTE — Progress Notes (Signed)
 Daily Progress Note   Patient Name: Alex Cortez       Date: 06/05/2023 DOB: 02-23-1951  Age: 73 y.o. MRN#: [id number] Attending Physician: Debarah Crape, DO Primary Care Physician: Juliette Alcide, MD Admit Date: 05/25/2023  Reason for Consultation/Follow-up: Establishing goals of care  Subjective: Confused, appears comfortable at present,      Length of Stay: 9  Current Medications: Scheduled Meds:   allopurinol  300 mg Oral Daily   carvedilol  12.5 mg Oral BID WC   clopidogrel  75 mg Oral Daily   folic acid  1 mg Oral Daily   insulin aspart  0-20 Units Subcutaneous TID WC   levothyroxine  75 mcg Oral Q0600   tamsulosin  0.4 mg Oral Daily    Continuous Infusions:    PRN Meds: acetaminophen **OR** acetaminophen, haloperidol **OR** haloperidol lactate, lidocaine-prilocaine, magnesium hydroxide, nitroGLYCERIN, ondansetron **OR** ondansetron (ZOFRAN) IV, traZODone  Physical Exam         Elderly appearing African-American gentleman resting in bed Confused Appears in mild distress Has regular work of breathing Does not have lower extremity edema  Vital Signs: BP (!) 143/54   Pulse 64   Temp 98.6 F (37 C)   Resp 18   Ht 6\' 3"  (1.905 m)   Wt 103.1 kg   SpO2 98%   BMI 28.41 kg/m  SpO2: SpO2: 98 % O2 Device: O2 Device: Room Air O2 Flow Rate: O2 Flow Rate (L/min): 2 L/min  Intake/output summary:  Intake/Output Summary (Last 24 hours) at 06/05/2023 1247 Last data filed at 06/04/2023 1653 Gross per 24 hour  Intake 160 ml  Output --  Net 160 ml   LBM: Last BM Date : 06/03/23 Baseline Weight: Weight: 112 kg Most recent weight: Weight: 103.1 kg       Palliative Assessment/Data:      Patient Active Problem List   Diagnosis Date Noted   Dehydration  06/02/2023   AKI (acute kidney injury) (HCC) 06/02/2023   Meningioma (HCC) 05/26/2023   Pelvic mass 05/26/2023   Acute kidney injury (HCC) 05/25/2023   Chronic diastolic CHF (congestive heart failure) (HCC) 11/17/2022   Coronary artery disease 11/17/2022   Insulin long-term use (HCC) 08/10/2022   Port-A-Cath in place 05/05/2022   DLBCL (diffuse large B cell lymphoma) (HCC) 04/23/2022   Hypothyroidism  01/12/2018   History of CVA with residual deficit    Essential hypertension    Type 2 diabetes mellitus with obesity (HCC)    Sedentary lifestyle 09/09/2015   Mixed hyperlipidemia 02/24/2015   Exertional dyspnea 10/18/2012   Diastolic heart failure (HCC) 10/01/2011   Obstructive sleep apnea 09/02/2011   LV dysfunction 09/02/2011   ERECTILE DYSFUNCTION, SECONDARY TO MEDICATION 04/14/2010   Type 2 diabetes mellitus with vascular disease (HCC) 10/02/2009   CORONARY ATHEROSCLEROSIS NATIVE CORONARY ARTERY 10/02/2009    Palliative Care Assessment & Plan   Patient Profile:    Assessment: 73 year old gentleman with medical history significant for diffuse large B-cell lymphoma, admitted for altered mental status.  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  Lumbar puncture and biopsy of abdominal mass confirms recurrence of diffuse large B-cell lymphoma.  Recommendations/Plan: DNR DNI Comfort care Transfer to residential hospice [facility] consult appreciated.        Goals of Care and Additional Recommendations: Limitations on Scope of Treatment: Full Comfort Care  Code Status:    Code Status Orders  (From admission, onward)           Start     Ordered   06/02/23 1354  Do not attempt resuscitation (DNR) - Comfort care  (Code Status)  Continuous       Question Answer Comment  If patient has no pulse and is not breathing Do Not Attempt Resuscitation   In Pre-Arrest Conditions (Patient Is Breathing and Has a Pulse) Provide comfort measures.  Relieve any mechanical airway obstruction. Avoid transfer unless required for comfort.   Consent: Discussion documented in EHR or advanced directives reviewed      06/02/23 1353           Code Status History     Date Active Date Inactive Code Status Order ID Comments User Context   05/26/2023 0009 06/02/2023 1353 Full Code [id number]  [facility] Vernetta Honey, MD Inpatient   11/17/2022 0220 11/18/2022 1834 Full Code [id number]  [facility] Vernetta Honey, MD Inpatient   07/11/2022 0736 07/14/2022 1848 Full Code [id number]  Vassie Loll, MD Inpatient   04/26/2022 1226 04/27/2022 0507 Full Code [id number]  Bennie Dallas, MD HOV   07/31/2017 1043 08/03/2017 2131 Full Code [id number]  Reymundo Poll, MD Inpatient       Prognosis:  < 2 weeks  Discharge Planning: Hospice facility  Care plan was discussed with IDT  Thank you for allowing the Palliative Medicine Team to assist in the care of this patient. low MDM     Greater than 50%  of this time was spent counseling and coordinating care related to the above assessment and plan.  Rosalin Hawking, MD  Please contact Palliative Medicine Team phone at [phone] for questions and concerns.

## 2023-06-06 DIAGNOSIS — D329 Benign neoplasm of meninges, unspecified: Secondary | ICD-10-CM | POA: Diagnosis not present

## 2023-06-06 DIAGNOSIS — C8330 Diffuse large B-cell lymphoma, unspecified site: Secondary | ICD-10-CM | POA: Diagnosis not present

## 2023-06-06 DIAGNOSIS — N179 Acute kidney failure, unspecified: Secondary | ICD-10-CM | POA: Diagnosis not present

## 2023-06-06 DIAGNOSIS — R1900 Intra-abdominal and pelvic swelling, mass and lump, unspecified site: Secondary | ICD-10-CM | POA: Diagnosis not present

## 2023-06-06 NOTE — Progress Notes (Signed)
   06/06/23 1102  Spiritual Encounters  Type of Visit Initial  Care provided to: Pt and family  Referral source Clinical staff  Reason for visit End-of-life   Per referral from palliative team,. I offered support to Mr. Boord and his wife, Jerilee Field.  Mrs. Plummer indicated her [employer] faith as a source of comfort and meaning, also that they have a daughter and two sons. She engaged in brief life review, sharing that her and Mr. Dundon have been married 35 years.  With their consent I offered prayer. I encouraged Mrs. Bastin's self-care and let them know that spiritual care support remains available as needed.  Mohamed Portlock L. Sophronia Simas, M.Div [phone]

## 2023-06-06 NOTE — Progress Notes (Signed)
 Daily Progress Note   Patient Name: Alex Cortez       Date: 06/06/2023 DOB: 12/09/1950  Age: 73 y.o. MRN#: [id number] Attending Physician: Debarah Crape, DO Primary Care Physician: Juliette Alcide, MD Admit Date: 05/25/2023  Reason for Consultation/Follow-up: Establishing goals of care  Subjective: Confused, appears comfortable at present, wife at bedside.      Length of Stay: 10  Current Medications: Scheduled Meds:   allopurinol  300 mg Oral Daily   carvedilol  12.5 mg Oral BID WC   clopidogrel  75 mg Oral Daily   folic acid  1 mg Oral Daily   levothyroxine  75 mcg Oral Q0600   tamsulosin  0.4 mg Oral Daily    Continuous Infusions:    PRN Meds: acetaminophen **OR** acetaminophen, haloperidol **OR** haloperidol lactate, lidocaine-prilocaine, magnesium hydroxide, nitroGLYCERIN, ondansetron **OR** ondansetron (ZOFRAN) IV, traZODone  Physical Exam         Elderly appearing African-American gentleman resting in bed Confused Appears in mild distress Has regular work of breathing Does not have lower extremity edema  Vital Signs: BP (!) 117/56 (BP Location: Right Arm)   Pulse 65   Temp 97.7 F (36.5 C) (Oral)   Resp 20   Ht 6\' 3"  (1.905 m)   Wt 103.1 kg   SpO2 100%   BMI 28.41 kg/m  SpO2: SpO2: 100 % O2 Device: O2 Device: Room Air O2 Flow Rate: O2 Flow Rate (L/min): 2 L/min  Intake/output summary: No intake or output data in the 24 hours ending 06/06/23 1204  LBM: Last BM Date : 06/03/23 Baseline Weight: Weight: 112 kg Most recent weight: Weight: 103.1 kg       Palliative Assessment/Data:      Patient Active Problem List   Diagnosis Date Noted   Dehydration 06/02/2023   AKI (acute kidney injury) (HCC) 06/02/2023   Meningioma (HCC) 05/26/2023    Pelvic mass 05/26/2023   Acute kidney injury (HCC) 05/25/2023   Chronic diastolic CHF (congestive heart failure) (HCC) 11/17/2022   Coronary artery disease 11/17/2022   Insulin long-term use (HCC) 08/10/2022   Port-A-Cath in place 05/05/2022   DLBCL (diffuse large B cell lymphoma) (HCC) 04/23/2022   Hypothyroidism 01/12/2018   History of CVA with residual deficit    Essential hypertension    Type 2 diabetes mellitus  with obesity (HCC)    Sedentary lifestyle 09/09/2015   Mixed hyperlipidemia 02/24/2015   Exertional dyspnea 10/18/2012   Diastolic heart failure (HCC) 10/01/2011   Obstructive sleep apnea 09/02/2011   LV dysfunction 09/02/2011   ERECTILE DYSFUNCTION, SECONDARY TO MEDICATION 04/14/2010   Type 2 diabetes mellitus with vascular disease (HCC) 10/02/2009   CORONARY ATHEROSCLEROSIS NATIVE CORONARY ARTERY 10/02/2009    Palliative Care Assessment & Plan   Patient Profile:    Assessment: 73 year old gentleman with medical history significant for diffuse large B-cell lymphoma, admitted for altered mental status.  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  Lumbar puncture and biopsy of abdominal mass confirms recurrence of diffuse large B-cell lymphoma.  Recommendations/Plan: DNR DNI Comfort care Transfer to residential hospice when bed available.  TOC consult appreciated.        Goals of Care and Additional Recommendations: Limitations on Scope of Treatment: Full Comfort Care  Code Status:    Code Status Orders  (From admission, onward)           Start     Ordered   06/02/23 1354  Do not attempt resuscitation (DNR) - Comfort care  (Code Status)  Continuous       Question Answer Comment  If patient has no pulse and is not breathing Do Not Attempt Resuscitation   In Pre-Arrest Conditions (Patient Is Breathing and Has a Pulse) Provide comfort measures. Relieve any mechanical airway obstruction. Avoid transfer unless  required for comfort.   Consent: Discussion documented in EHR or advanced directives reviewed      06/02/23 1353           Code Status History     Date Active Date Inactive Code Status Order ID Comments User Context   05/26/2023 0009 06/02/2023 1353 Full Code [id number]  [facility] Vernetta Honey, MD Inpatient   11/17/2022 0220 11/18/2022 1834 Full Code [id number]  [facility] Vernetta Honey, MD Inpatient   07/11/2022 0736 07/14/2022 1848 Full Code [id number]  Vassie Loll, MD Inpatient   04/26/2022 1226 04/27/2022 0507 Full Code [id number]  Bennie Dallas, MD HOV   07/31/2017 1043 08/03/2017 2131 Full Code [id number]  Reymundo Poll, MD Inpatient       Prognosis:  < 2 weeks  Discharge Planning: Hospice facility  Care plan was discussed with wife  Thank you for allowing the Palliative Medicine Team to assist in the care of this patient. low MDM     Greater than 50%  of this time was spent counseling and coordinating care related to the above assessment and plan.  Rosalin Hawking, MD  Please contact Palliative Medicine Team phone at [phone] for questions and concerns.

## 2023-06-06 NOTE — Progress Notes (Signed)
 Messaged MD to please come sign DNR form as patient has been accepted to [facility]

## 2023-06-06 NOTE — Discharge Summary (Signed)
 Physician Discharge Summary   Patient: Alex Cortez MRN: [id number] DOB: 12-06-1950  Admit date:     05/25/2023  Discharge date: 06/06/23  Discharge Physician: Debarah Crape   PCP: Juliette Alcide, MD   Discharge Diagnoses: Principal Problem:   Acute kidney injury [facility]) Active Problems:   Meningioma (HCC)   Pelvic mass   Type 2 diabetes mellitus with vascular disease (HCC)   History of CVA with residual deficit   Essential hypertension   DLBCL (diffuse large B cell lymphoma) (HCC)   Chronic diastolic CHF (congestive heart failure) (HCC)   Dehydration   AKI (acute kidney injury) (HCC)  Resolved Problems:   * No resolved hospital problems. *  Hospital Course: Alex Cortez is 73 y.o. male with B cell lymphoma CAD, type 2 diabetes, hypertension, history of CVA, history of frontotemporal meningioma, initially admitted to [facility] for progressive weakness and decreasing ability to walk over the week prior. Workup revealed progressive pericolonic inflammatory stranding and a 3 x 6 soft tissue density within the presacral space.  MRI brain revealed a 6 cm extra-axial mass likely meningioma.  Patient was transferred to [facility] for biopsy of presacral area.  Biopsy resulted for B-cell lymphoma. His confusion became progressively worse.  Brain MRI was performed which was notable for abnormal leptomeningeal enhancement which could reveal infectious or lymphomatous meningitis.  He was also noted to have a 13 mm area of enhancement in the inferior left cerebellum differential including subacute infarct or CNS lymphoma.  Neurology was consulted.  He was initiated on empiric meningitis coverage and underwent LP with notable lymphocytic pleocytosis, elevated CSF protein, normal CSF glucose.  CSF culture and bio fire panel was negative, and antibiotics were de-escalated.  His confusion has persisted despite antibiotics.  Ultimately CSF resulted for B-cell lymphoma. Given recurrence of his  B-cell lymphoma, oncology was consulted and discussed directly with [facility] lymphoma team but unfortunately there are no further interventions available to him.  Palliative care was consulted and ultimately family decided to proceed with hospice.  Stay was prolonged pending hospice arrangements.  Patient was treated as comfort measures only while in the hospital.  He is discharging today directly to hospice facility.   Comfort measures only - Given recent diagnosis of diffuse large B-cell lymphoma recurrence, patient no longer has curative treatment options. -Discharging directly to hospice facility today.    Diffuse large B-cell lymphoma with recurrence - MRI brain 3/13: Abnormal leptomeningeal enhancement which could be infectious or lymphomatous - Lumbar puncture 3/14 path + B Cell lymphoma  - Oncology consulted and following.  Oncology has discussed directly with [facility] lymphoma team and unfortunately there is no further intervention Acute encephalopathy - Initially presumed to be infectious, CSF studies have been negative. Likely 2/2 to CNS Lymphoma -- LP path consistent with lymphoma - Was initially started on ampicillin, has since been discontinued - Neurology consulted - MRI brain with multiple abnormalities: Abnormal leptomeningeal enhancement which could be infectious or lymphomatous - EEG: Suggestive of diffuse encephalopathy.  No seizures or epileptiform discharges Presacral mass, Consistent with B-cell lymphoproliferative disorder. - 3 x 5 cm soft tissue density in presacral area status post biopsy with interventional radiology - Results: Consistent with B-cell lymphoproliferative disorder. - Oncology is following, appreciate recs Epididymitis Scrotal edema - Prior to arrival. Seen on scrotal ultrasound prior to hospitalization.  Mass is likely interfering with drainage -  Continue anti-inflammatory, elevation, tamsulosin. Discussed with RN - Foley catheter placed  3/24 due  to concerns of retention with penile swelling. AKI - Resolved with IV fluids - Stop trending Staph hemolyticus bacteremia - 1 out of 2 blood cultures positive.  Unclear if true infection vs contaminent.  Repeat blood cultures 3/18 negative to date - Received Ceftriaxone x 6 days.  Further antibiotics now discontinued in light of comfort measures only status. - Echocardiogram without evidence of valvular vegetations Heart failure with preserved EF CAD Hypertension - Echocardiogram 3/15: LVEF 55 to 60%, no regional wall motion abnormalities, grade 1 diastolic dysfunction. History of CVA -Continue Plavix. Stop stain and Zetia  Type 2 diabetes BMI 28       Consultants: Oncology, Palliative care Procedures performed: LP, EEG, Mass biopsy  Disposition: Hospice care Diet recommendation:  Discharge Diet Orders (From admission, onward)     Start     Ordered   06/06/23 0000  Diet general        06/06/23 1106           Regular diet DISCHARGE MEDICATION: Allergies as of 06/06/2023       Reactions   Ace Inhibitors Swelling   Angioedema   Alex Swelling   Shellfish  Swelling and hives Shellfish  Swelling and hives   Rituxan [rituximab] Alex (See Comments)   Pt had sensitivity reaction to Rituxan. See progress note from 05/14/22. Pt able to complete infusion.    Shrimp [shellfish Allergy] Hives, Swelling        Medication List     STOP taking these medications    dapagliflozin propanediol 10 MG Tabs tablet Commonly known as: Farxiga   doxycycline 100 MG capsule Commonly known as: VIBRAMYCIN   ezetimibe 10 MG tablet Commonly known as: ZETIA   FreeStyle Libre 3 Plus Sensor Misc   FreeStyle Libre 3 Reader Devi   HumuLIN R U-500 KwikPen 500 UNIT/ML KwikPen Generic drug: insulin regular human CONCENTRATED   isosorbide mononitrate 30 MG 24 hr tablet Commonly known as: IMDUR   lidocaine-prilocaine cream Commonly known as: EMLA   losartan 50 MG  tablet Commonly known as: COZAAR   metFORMIN 500 MG tablet Commonly known as: GLUCOPHAGE   nitroGLYCERIN 0.4 MG SL tablet Commonly known as: Nitrostat   ONE TOUCH ULTRA TEST test strip Generic drug: glucose blood   rosuvastatin 40 MG tablet Commonly known as: CRESTOR   tamsulosin 0.4 MG Caps capsule Commonly known as: FLOMAX       TAKE these medications    allopurinol 300 MG tablet Commonly known as: ZYLOPRIM TAKE 1 TABLET BY MOUTH EVERY DAY   carvedilol 12.5 MG tablet Commonly known as: COREG Take 1 tablet (12.5 mg total) by mouth 2 (two) times daily with a meal.   clopidogrel 75 MG tablet Commonly known as: PLAVIX Take 1 tablet (75 mg total) by mouth daily.   levothyroxine 75 MCG tablet Commonly known as: SYNTHROID TAKE 1 TABLET BY MOUTH EVERY DAY BEFORE BREAKFAST What changed: See the new instructions.        Discharge Exam: Filed Weights   05/25/23 1746 05/26/23 0002  Weight: 112 kg 103.1 kg   General exam: Appears calm and comfortable, sleeping easily, no acute distress Respiratory system: No work of breathing, symmetric chest wall expansion Cardiovascular system: S1 & S2 heard, RRR.  Gastrointestinal system: Abdomen is nondistended, soft and nontender. GU: Scrotal and penile swelling persistent, Extremities: Symmetric, expected ROM Skin: No rashes, lesions  Condition at discharge: worsening  The results of significant diagnostics from this hospitalization (including imaging, microbiology, ancillary and laboratory)  are listed below for reference.   Imaging Studies: EEG adult Result Date: 06/01/2023 Charlsie Quest, MD     06/01/2023  8:42 AM Patient Name: Alex Cortez MRN: [id number] Epilepsy Attending: Charlsie Quest Referring Physician/Provider: Erick Blinks, MD Date: 05/31/2023 Duration: 24.51 mins Patient history: 73yo M with ams. EEG to evaluate for seizure Level of alertness: Awake, aslepe AEDs during EEG study: None Technical  aspects: This EEG study was done with scalp electrodes positioned according to the 10-20 International system of electrode placement. Electrical activity was reviewed with band pass filter of 1-70Hz , sensitivity of 7 uV/mm, display speed of 70mm/sec with a 60Hz  notched filter applied as appropriate. EEG data were recorded continuously and digitally stored.  Video monitoring was available and reviewed as appropriate. Description: The posterior dominant rhythm consists of 8-9 Hz activity of moderate voltage (25-35 uV) seen predominantly in posterior head regions, symmetric and reactive to eye opening and eye closing. Sleep was characterized by vertex waves, sleep spindles (12 to 14 Hz), maximal frontocentral region. EEG showed intermittent generalized 3 to 6 Hz theta-delta slowing. Hyperventilation and photic stimulation were not performed.   ABNORMALITY - Intermittent slow, generalized IMPRESSION: This study is suggestive of mild diffuse encephalopathy. No seizures or epileptiform discharges were seen throughout the recording. Charlsie Quest   ECHOCARDIOGRAM COMPLETE Result Date: 05/28/2023    ECHOCARDIOGRAM REPORT   Patient Name:   Alex Cortez Date of Exam: 05/28/2023 Medical Rec #:  [id number]        Height:       75.0 in Accession #:    [id number]       Weight:       227.3 lb Date of Birth:  01/28/1951        BSA:          2.317 m Patient Age:    72 years         BP:           133/58 mmHg Patient Gender: M                HR:           62 bpm. Exam Location:  Inpatient Procedure: 2D Echo, 3D Echo, Cardiac Doppler, Color Doppler and Strain Analysis            (Both Spectral and Color Flow Doppler were utilized during            procedure). Indications:    Bacteremia  History:        Patient has prior history of Echocardiogram examinations, most                 recent 04/19/2022. Previous Myocardial Infarction and CAD, Stroke;                 Risk Factors:Sleep Apnea, Hypertension, Diabetes and                  Dyslipidemia.  Sonographer:    Vern Claude Referring Phys: [id number] BELINDA K KYERE IMPRESSIONS  1. Left ventricular ejection fraction, by estimation, is 55 to 60%. The left ventricle has normal function. The left ventricle has no regional wall motion abnormalities. Left ventricular diastolic parameters are consistent with Grade I diastolic dysfunction (impaired relaxation). The average left ventricular global longitudinal strain is -18.7 %. The global longitudinal strain is normal.  2. Right ventricular systolic function is normal. The right ventricular size is normal.  3. The mitral valve is normal in  structure. No evidence of mitral valve regurgitation. No evidence of mitral stenosis.  4. The aortic valve is normal in structure. Aortic valve regurgitation is not visualized. No aortic stenosis is present.  5. The inferior vena cava is normal in size with greater than 50% respiratory variability, suggesting right atrial pressure of 3 mmHg. Conclusion(s)/Recommendation(s): No evidence of valvular vegetations on this transthoracic echocardiogram. Consider a transesophageal echocardiogram to exclude infective endocarditis if clinically indicated. FINDINGS  Left Ventricle: Left ventricular ejection fraction, by estimation, is 55 to 60%. The left ventricle has normal function. The left ventricle has no regional wall motion abnormalities. The average left ventricular global longitudinal strain is -18.7 %. Strain was performed and the global longitudinal strain is normal. The left ventricular internal cavity size was normal in size. There is no left ventricular hypertrophy. Left ventricular diastolic parameters are consistent with Grade I diastolic dysfunction (impaired relaxation). Right Ventricle: The right ventricular size is normal. No increase in right ventricular wall thickness. Right ventricular systolic function is normal. Left Atrium: Left atrial size was normal in size. Right Atrium: Right atrial size was normal  in size. Pericardium: There is no evidence of pericardial effusion. Presence of epicardial fat layer. Mitral Valve: The mitral valve is normal in structure. No evidence of mitral valve regurgitation. No evidence of mitral valve stenosis. MV peak gradient, 3.5 mmHg. The mean mitral valve gradient is 1.0 mmHg. Tricuspid Valve: The tricuspid valve is normal in structure. Tricuspid valve regurgitation is not demonstrated. No evidence of tricuspid stenosis. Aortic Valve: The aortic valve is normal in structure. Aortic valve regurgitation is not visualized. No aortic stenosis is present. Aortic valve mean gradient measures 7.0 mmHg. Aortic valve peak gradient measures 13.5 mmHg. Aortic valve area, by VTI measures 1.78 cm. Pulmonic Valve: The pulmonic valve was normal in structure. Pulmonic valve regurgitation is not visualized. No evidence of pulmonic stenosis. Aorta: The aortic root is normal in size and structure. Venous: The inferior vena cava is normal in size with greater than 50% respiratory variability, suggesting right atrial pressure of 3 mmHg. IAS/Shunts: No atrial level shunt detected by color flow Doppler. Additional Comments: 3D was performed not requiring image post processing on an independent workstation and was normal.  LEFT VENTRICLE PLAX 2D LVIDd:         4.90 cm      Diastology LVIDs:         3.20 cm      LV e' medial:    5.98 cm/s LV PW:         1.00 cm      LV E/e' medial:  11.5 LV IVS:        0.90 cm      LV e' lateral:   8.05 cm/s LVOT diam:     2.30 cm      LV E/e' lateral: 8.6 LV SV:         76 LV SV Index:   33           2D Longitudinal Strain LVOT Area:     4.15 cm     2D Strain GLS Avg:     -18.7 %  LV Volumes (MOD) LV vol d, MOD A2C: 166.0 ml 3D Volume EF: LV vol d, MOD A4C: 181.0 ml 3D EF:        56 % LV vol s, MOD A2C: 68.4 ml  LV EDV:       263 ml LV vol s, MOD A4C: 74.1 ml  LV ESV:       116 ml LV SV MOD A2C:     97.6 ml  LV SV:        147 ml LV SV MOD A4C:     181.0 ml LV SV MOD BP:       98.8 ml RIGHT VENTRICLE RV Basal diam:  4.00 cm RV Mid diam:    2.10 cm RV S prime:     12.90 cm/s LEFT ATRIUM             Index        RIGHT ATRIUM           Index LA diam:        3.80 cm 1.64 cm/m   RA Area:     13.60 cm LA Vol (A2C):   50.5 ml 21.80 ml/m  RA Volume:   29.40 ml  12.69 ml/m LA Vol (A4C):   34.5 ml 14.89 ml/m LA Biplane Vol: 42.0 ml 18.13 ml/m  AORTIC VALVE                     PULMONIC VALVE AV Area (Vmax):    1.77 cm      PV Vmax:          0.79 m/s AV Area (Vmean):   1.47 cm      PV Peak grad:     2.5 mmHg AV Area (VTI):     1.78 cm      PR End Diast Vel: 3.69 msec AV Vmax:           184.00 cm/s AV Vmean:          131.000 cm/s AV VTI:            0.427 m AV Peak Grad:      13.5 mmHg AV Mean Grad:      7.0 mmHg LVOT Vmax:         78.30 cm/s LVOT Vmean:        46.200 cm/s LVOT VTI:          0.183 m LVOT/AV VTI ratio: 0.43  AORTA Ao Root diam: 3.50 cm Ao Asc diam:  3.00 cm MITRAL VALVE MV Area (PHT): 2.42 cm    SHUNTS MV Area VTI:   2.28 cm    Systemic VTI:  0.18 m MV Peak grad:  3.5 mmHg    Systemic Diam: 2.30 cm MV Mean grad:  1.0 mmHg MV Vmax:       0.94 m/s MV Vmean:      58.0 cm/s MV Decel Time: 313 msec MV E velocity: 69.00 cm/s MV A velocity: 90.70 cm/s MV E/A ratio:  0.76 Kardie Tobb DO Electronically signed by Thomasene Ripple DO Signature Date/Time: 05/28/2023/12:57:40 PM    Final    CT BIOPSY Result Date: 05/27/2023 CLINICAL DATA:  Mental status change, history of diffuse large B-cell lymphoma, presacral pelvic mass noted on recent CT pelvis EXAM: CT GUIDED CORE BIOPSY OF PRESACRAL MASS CT-GUIDED LUMBAR PUNCTURE ANESTHESIA/SEDATION: Intravenous Fentanyl and Versed 1.5mg  were administered by RN during a total moderate (conscious) sedation time of 20 minutes; the patient's level of consciousness and physiological / cardiorespiratory status were monitored continuously by radiology RN under my direct supervision. PROCEDURE: The procedure risks, benefits, and alternatives were  explained to the spouse, due to the altered mental status. Questions regarding the procedure were encouraged and answered. The spouse understands and consents to the procedure. Patient placed prone. Select axial  scans through the lumbosacral spine obtained. Initially, an appropriate skin entry site was determined for lumbar puncture. Skin prepped with Betadine, draped in usual sterile fashion, infiltrated locally with 1% lidocaine. Under CT fluoroscopic guidance, a 20 gauge spinal needle advanced into the lower lumbar thecal sac. Clear colorless CSF return. 6 mL were collected among for sterile bowels for the requested laboratory studies. Needle was removed and Band-Aid placed. Based on the initial scout images, a an appropriate skin entry site for presacral biopsy was then determined and marked on the skin. Region prepped with chlorhexidine, draped in usual sterile fashion, infiltrated locally with 1% lidocaine. Under CT fluoroscopic guidance, a 17 gauge trocar needle was advanced to the margin of the lesion. Once needle tip position was confirmed, coaxial 18-gauge core biopsy samples were obtained, submitted in saline to surgical pathology. The guide needle was removed. . Postprocedure scans show no hemorrhage or Alex apparent complication. COMPLICATIONS: None immediate FINDINGS: Clear colorless CSF returned on lumbar puncture, 6 mL collected for the requested laboratory studies. The presacral mass was localized and representative 18 gauge core biopsy samples obtained as above, submitted in saline to surgical pathology. IMPRESSION: 1. Technically successful lumbar puncture under CT. 2. Technically successful presacral mass core biopsy under CT guidance. RADIATION DOSE REDUCTION: This exam was performed according to the departmental dose-optimization program which includes automated exposure control, adjustment of the mA and/or kV according to patient size and/or use of iterative reconstruction technique.  Electronically Signed   By: Corlis Leak M.D.   On: 05/27/2023 16:56   CT GUIDED NEEDLE PLACEMENT Result Date: 05/27/2023 CLINICAL DATA:  Mental status change, history of diffuse large B-cell lymphoma, presacral pelvic mass noted on recent CT pelvis EXAM: CT GUIDED CORE BIOPSY OF PRESACRAL MASS CT-GUIDED LUMBAR PUNCTURE ANESTHESIA/SEDATION: Intravenous Fentanyl and Versed 1.5mg  were administered by RN during a total moderate (conscious) sedation time of 20 minutes; the patient's level of consciousness and physiological / cardiorespiratory status were monitored continuously by radiology RN under my direct supervision. PROCEDURE: The procedure risks, benefits, and alternatives were explained to the spouse, due to the altered mental status. Questions regarding the procedure were encouraged and answered. The spouse understands and consents to the procedure. Patient placed prone. Select axial scans through the lumbosacral spine obtained. Initially, an appropriate skin entry site was determined for lumbar puncture. Skin prepped with Betadine, draped in usual sterile fashion, infiltrated locally with 1% lidocaine. Under CT fluoroscopic guidance, a 20 gauge spinal needle advanced into the lower lumbar thecal sac. Clear colorless CSF return. 6 mL were collected among for sterile bowels for the requested laboratory studies. Needle was removed and Band-Aid placed. Based on the initial scout images, a an appropriate skin entry site for presacral biopsy was then determined and marked on the skin. Region prepped with chlorhexidine, draped in usual sterile fashion, infiltrated locally with 1% lidocaine. Under CT fluoroscopic guidance, a 17 gauge trocar needle was advanced to the margin of the lesion. Once needle tip position was confirmed, coaxial 18-gauge core biopsy samples were obtained, submitted in saline to surgical pathology. The guide needle was removed. . Postprocedure scans show no hemorrhage or Alex apparent  complication. COMPLICATIONS: None immediate FINDINGS: Clear colorless CSF returned on lumbar puncture, 6 mL collected for the requested laboratory studies. The presacral mass was localized and representative 18 gauge core biopsy samples obtained as above, submitted in saline to surgical pathology. IMPRESSION: 1. Technically successful lumbar puncture under CT. 2. Technically successful presacral mass core biopsy under  CT guidance. RADIATION DOSE REDUCTION: This exam was performed according to the departmental dose-optimization program which includes automated exposure control, adjustment of the mA and/or kV according to patient size and/or use of iterative reconstruction technique. Electronically Signed   By: Corlis Leak M.D.   On: 05/27/2023 16:56   MR Brain W and Wo Contrast Result Date: 05/26/2023 CLINICAL DATA:  Mental status change with unknown cause. EXAM: MRI HEAD WITHOUT AND WITH CONTRAST TECHNIQUE: Multiplanar, multiecho pulse sequences of the brain and surrounding structures were obtained without and with intravenous contrast. CONTRAST:  10mL GADAVIST GADOBUTROL 1 MMOL/ML IV SOLN COMPARISON:  Head CT from yesterday FINDINGS: Brain: Wedge of weakly restricted diffusion in the inferior left cerebellum with homogeneous enhancement, area measuring up to 13 mm. There have been numerous prior small vessel infarcts in the bilateral cerebellum. There is also subtle leptomeningeal enhancement especially in the inferior left cerebellum, along superior cerebellar folia, and likely along the inferior temporal lobes. Indistinct enhancement at least in the left internal auditory canal. Chronic small vessel ischemic gliosis in the cerebral white matter with chronic perforator infarct at the left external capsule and corona radiata. Ventriculomegaly primarily attributed to brain atrophy. Periventricular FLAIR hyperintensity likely from chronic small vessel ischemia. No morphologic changes typical of communicating  hydrocephalus. Dural based mass along the left cerebral convexity with dural tail, mass measuring 6.5 cm in length by 1.8 cm in thickness. The mass has gradually enlarged since a 2019 brain MRI, consistent with meningioma rather than CNS lymphoma. There is local mass effect without brain edema. Vascular: Major flow voids are preserved Skull and upper cervical spine: No focal marrow lesion. Sinuses/Orbits: Patchy opacification of the paranasal sinuses with multiple areas of polypoid formation, including a nonenhancing polyp projecting into the nasopharynx. IMPRESSION: 1. Abnormal leptomeningeal enhancement which could be infectious or lymphomatous meningitis. 2. 13 mm area of enhancement in the inferior left cerebellum has signal characteristics seen in both subacute infarct and CNS lymphoma. Consider CSF analysis. 3. Large meningioma along the left frontal convexity, slowly growing since at least 2019. Electronically Signed   By: Tiburcio Pea M.D.   On: 05/26/2023 11:09   CT ABDOMEN PELVIS WO CONTRAST Result Date: 05/25/2023 CLINICAL DATA:  Left lower quadrant abdominal pain EXAM: CT ABDOMEN AND PELVIS WITHOUT CONTRAST TECHNIQUE: Multidetector CT imaging of the abdomen and pelvis was performed following the standard protocol without IV contrast. RADIATION DOSE REDUCTION: This exam was performed according to the departmental dose-optimization program which includes automated exposure control, adjustment of the mA and/or kV according to patient size and/or use of iterative reconstruction technique. COMPARISON:  04/28/2023 FINDINGS: Lower chest: Appear bilateral lower lobe pulmonary nodules are stable since remote prior examination 02/13/2022 and are safely considered benign. No acute abnormality. Extensive multi-vessel coronary artery calcification. Hepatobiliary: No focal liver abnormality is seen. No gallstones, gallbladder wall thickening, or biliary dilatation. Pancreas: Unremarkable Spleen: Unremarkable  Adrenals/Urinary Tract: Adrenal glands are unremarkable. There is extensive bilateral perinephric and periureteric inflammatory stranding within the retroperitoneum which appears progressive since prior examination and extends into the pelvis where there is severe perivesicular inflammatory stranding and progressive bladder wall thickening. The findings can be seen in the setting aggressive infection. In the appropriate clinical setting, radiation therapy could also result in a similar appearance. No hydronephrosis. No intrarenal or ureteral calculi. The bladder is not distended. Stomach/Bowel: Moderate colonic stool burden. There is progressive pericolonic inflammatory stranding involving the distal rectosigmoid colon and rectum with asymmetric inflammatory changes within the left  retroperitoneum along the left pelvic sidewall. There is, additionally, progressive soft tissue density within the presacral space measuring 3.1 x 3.1 x 5.7 cm at axial image # 74/2 and sagittal image # 71/6 which is not well characterized on this noncontrast examination but may represent developing phlegmon or an infiltrative mass. There is extensive circumferential rectal wall thickening involving the terminal rectum. The stomach, small bowel, and large bowel are otherwise unremarkable. Appendix. Vascular/Lymphatic: Aortic atherosclerosis. No enlarged abdominal or pelvic lymph nodes. Reproductive: There is inflammatory stranding involving the root of the penis, again possibly related to changes local infection, as can be seen with cellulitis, or radiation therapy. The prostate gland is of normal size but difficult to discretely discern adjacent inflammatory change. Alex: Stable dermal thickening within the anterior abdominal wall nonspecific. Postsurgical changes noted within the right inguinal region. Musculoskeletal: No acute bone abnormality. No lytic or blastic bone lesion. IMPRESSION: 1. Progressive pericolonic inflammatory  stranding involving the distal rectosigmoid colon and rectum with asymmetric inflammatory changes within the left retroperitoneum along the left pelvic sidewall. There is, additionally, progressive soft tissue density within the presacral space measuring 3.1 x 3.1 x 5.7 cm which is not well characterized on this noncontrast examination but may represent developing phlegmon or an infiltrative mass. Dedicated contrast enhanced CT or MRI examination is recommended for further evaluation. 2. Progressive bilateral perinephric and periureteric inflammatory stranding within the retroperitoneum which extends into the pelvis where there is severe perivesicular inflammatory stranding and progressive bladder wall thickening. The findings can be seen in the setting of aggressive infection and correlation with urinalysis and urine culture is recommended. In the appropriate clinical setting, radiation therapy could also result in a similar appearance. 3. Inflammatory stranding involving the root of the penis, again possibly related to changes local infection, as can be seen with cellulitis, or radiation therapy. 4. Extensive multi-vessel coronary artery calcification. Aortic Atherosclerosis (ICD10-I70.0). Electronically Signed   By: Helyn Numbers M.D.   On: 05/25/2023 23:35   CT Head Wo Contrast Result Date: 05/25/2023 CLINICAL DATA:  Mental status change EXAM: CT HEAD WITHOUT CONTRAST TECHNIQUE: Contiguous axial images were obtained from the base of the skull through the vertex without intravenous contrast. RADIATION DOSE REDUCTION: This exam was performed according to the departmental dose-optimization program which includes automated exposure control, adjustment of the mA and/or kV according to patient size and/or use of iterative reconstruction technique. COMPARISON:  CT 03/06/2018, MRI 08/02/2017 FINDINGS: Brain: No acute territorial infarction or hemorrhage is visualized. Interval mild ventricular enlargement compared  to prior head CT but no significant progression of atrophy. Underlying chronic small vessel ischemic changes of the white matter but increased white matter hypodensity adjacent to the frontal and occipital horns. Small chronic appearing left capsular infarct extending into the white matter. Small chronic cerebellar infarcts. Possible small chronic right occipital infarct. Interval enlargement of left convexity slightly dense plaque like mass, measures about 6.3 cm AP x 17 cm transverse on series 2, image 13, previously 41 x 14 mm. No surrounding hypodense edema. No midline shift. Vascular: No hyperdense vessels.  Carotid vascular calcification Skull: Normal. Negative for fracture or focal lesion. Sinuses/Orbits: Mucosal thickening in the sinuses with small fluid level right sphenoid sinus Alex: None IMPRESSION: 1. Negative for acute intracranial hemorrhage or midline shift. 2. Interval enlargement of left convexity slightly dense extra-axial mass, measures about 6.3 cm AP x 17 cm transverse, previously 41 x 14 mm. No surrounding hypodense edema. No midline shift. Findings previously characterized as probable  meningioma 3. Interval mild ventricular enlargement compared to prior head CT but no significant progression of atrophy. Increased white matter hypodensity adjacent to the frontal and occipital horns, difficult to exclude normal pressure hydrocephalus with transependymal flow of CSF. Suggest further assessment with MRI. Left convexity extra-axial enlarged mass could also be evaluated at MRI follow-up. 4. Small chronic infarcts as described above. 5. Mucosal thickening in the sinuses with small fluid level right sphenoid sinus, correlate for acute sinusitis. Electronically Signed   By: Jasmine Pang M.D.   On: 05/25/2023 21:46   DG Chest Port 1 View Result Date: 05/25/2023 CLINICAL DATA:  Weakness. EXAM: PORTABLE CHEST 1 VIEW COMPARISON:  November 16, 2022. FINDINGS: The heart size and mediastinal contours  are within normal limits. Both lungs are clear. The visualized skeletal structures are unremarkable. IMPRESSION: No active disease. Electronically Signed   By: Lupita Raider M.D.   On: 05/25/2023 20:56    Microbiology: Results for orders placed or performed during the hospital encounter of 05/25/23  Resp panel by RT-PCR (RSV, Flu A&B, Covid) Anterior Nasal Swab     Status: None   Collection Time: 05/25/23  6:30 PM   Specimen: Anterior Nasal Swab  Result Value Ref Range Status   SARS Coronavirus 2 by RT PCR NEGATIVE NEGATIVE Final    Comment: (NOTE) SARS-CoV-2 target nucleic acids are NOT DETECTED.  The SARS-CoV-2 RNA is generally detectable in upper respiratory specimens during the acute phase of infection. The lowest concentration of SARS-CoV-2 viral copies this assay can detect is 138 copies/mL. A negative result does not preclude SARS-Cov-2 infection and should not be used as the sole basis for treatment or Alex patient management decisions. A negative result may occur with  improper specimen collection/handling, submission of specimen Alex than nasopharyngeal swab, presence of viral mutation(s) within the areas targeted by this assay, and inadequate number of viral copies(<138 copies/mL). A negative result must be combined with clinical observations, patient history, and epidemiological information. The expected result is Negative.  Fact Sheet for Patients:  BloggerCourse.com  Fact Sheet for Healthcare Providers:  SeriousBroker.it  This test is no t yet approved or cleared by the [place] FDA and  has been authorized for detection and/or diagnosis of SARS-CoV-2 by FDA under an Emergency Use Authorization (EUA). This EUA will remain  in effect (meaning this test can be used) for the duration of the COVID-19 declaration under Section 564(b)(1) of the Act, 21 U.S.C.section 360bbb-3(b)(1), unless the authorization is  terminated  or revoked sooner.       Influenza A by PCR NEGATIVE NEGATIVE Final   Influenza B by PCR NEGATIVE NEGATIVE Final    Comment: (NOTE) The Xpert Xpress SARS-CoV-2/FLU/RSV plus assay is intended as an aid in the diagnosis of influenza from Nasopharyngeal swab specimens and should not be used as a sole basis for treatment. Nasal washings and aspirates are unacceptable for Xpert Xpress SARS-CoV-2/FLU/RSV testing.  Fact Sheet for Patients: BloggerCourse.com  Fact Sheet for Healthcare Providers: SeriousBroker.it  This test is not yet approved or cleared by the [place] FDA and has been authorized for detection and/or diagnosis of SARS-CoV-2 by FDA under an Emergency Use Authorization (EUA). This EUA will remain in effect (meaning this test can be used) for the duration of the COVID-19 declaration under Section 564(b)(1) of the Act, 21 U.S.C. section 360bbb-3(b)(1), unless the authorization is terminated or revoked.     Resp Syncytial Virus by PCR NEGATIVE NEGATIVE Final    Comment: (NOTE)  Fact Sheet for Patients: BloggerCourse.com  Fact Sheet for Healthcare Providers: SeriousBroker.it  This test is not yet approved or cleared by the [place] FDA and has been authorized for detection and/or diagnosis of SARS-CoV-2 by FDA under an Emergency Use Authorization (EUA). This EUA will remain in effect (meaning this test can be used) for the duration of the COVID-19 declaration under Section 564(b)(1) of the Act, 21 U.S.C. section 360bbb-3(b)(1), unless the authorization is terminated or revoked.  Performed at [facility], [address] [place], [place] [address]   Blood Culture (routine x 2)     Status: None   Collection Time: 05/25/23  7:56 PM   Specimen: BLOOD  Result Value Ref Range Status   Specimen Description BLOOD BLOOD RIGHT HAND  Final   Special  Requests   Final    BOTTLES DRAWN AEROBIC AND ANAEROBIC Blood Culture results may not be optimal due to an inadequate volume of blood received in culture bottles   Culture   Final    NO GROWTH 5 DAYS Performed at [facility], [address], [place], [place] [address]    Report Status 05/30/2023 FINAL  Final  Blood Culture (routine x 2)     Status: Abnormal   Collection Time: 05/25/23  7:59 PM   Specimen: Left Antecubital; Blood  Result Value Ref Range Status   Specimen Description   Final    LEFT ANTECUBITAL BOTTLES DRAWN AEROBIC AND ANAEROBIC Performed at [facility], [address], [place], [place] [address]    Special Requests   Final    Blood Culture results may not be optimal due to an inadequate volume of blood received in culture bottles Performed at [facility], [address] [place], [place] [address]    Culture  Setup Time   Final    GRAM POSITIVE COCCI Gram Stain Report Called to,Read Back By and Verified With: BROWN,K @ 0112 ON 05/28/23 BY JUW AEROBIC BOTTLE ONLY GS DONE @ [facility] CRITICAL RESULT CALLED TO, READ BACK BY AND VERIFIED WITH: M BELL,PHARMD@0737  05/28/23 MK    Culture (A)  Final    STAPHYLOCOCCUS HAEMOLYTICUS THE SIGNIFICANCE OF ISOLATING THIS ORGANISM FROM A SINGLE SET OF BLOOD CULTURES WHEN MULTIPLE SETS ARE DRAWN IS UNCERTAIN. PLEASE NOTIFY THE MICROBIOLOGY DEPARTMENT WITHIN ONE WEEK IF SPECIATION AND SENSITIVITIES ARE REQUIRED. Performed at [facility], [address], [place], [place] [address]    Report Status 05/29/2023 FINAL  Final  Blood Culture ID Panel (Reflexed)     Status: Abnormal   Collection Time: 05/25/23  7:59 PM  Result Value Ref Range Status   Enterococcus faecalis NOT DETECTED NOT DETECTED Final   Enterococcus Faecium NOT DETECTED NOT DETECTED Final   Listeria monocytogenes NOT DETECTED NOT DETECTED Final   Staphylococcus species DETECTED (A) NOT DETECTED Final    Comment: CRITICAL RESULT CALLED TO, READ BACK BY AND VERIFIED  WITH: M BELL,PHARMD@0737  05/28/23 MK    Staphylococcus aureus (BCID) NOT DETECTED NOT DETECTED Final   Staphylococcus epidermidis NOT DETECTED NOT DETECTED Final   Staphylococcus lugdunensis NOT DETECTED NOT DETECTED Final   Streptococcus species NOT DETECTED NOT DETECTED Final   Streptococcus agalactiae NOT DETECTED NOT DETECTED Final   Streptococcus pneumoniae NOT DETECTED NOT DETECTED Final   Streptococcus pyogenes NOT DETECTED NOT DETECTED Final   A.calcoaceticus-baumannii NOT DETECTED NOT DETECTED Final   Bacteroides fragilis NOT DETECTED NOT DETECTED Final   Enterobacterales NOT DETECTED NOT DETECTED Final   Enterobacter cloacae complex NOT DETECTED  NOT DETECTED Final   Escherichia coli NOT DETECTED NOT DETECTED Final   Klebsiella aerogenes NOT DETECTED NOT DETECTED Final   Klebsiella oxytoca NOT DETECTED NOT DETECTED Final   Klebsiella pneumoniae NOT DETECTED NOT DETECTED Final   Proteus species NOT DETECTED NOT DETECTED Final   Salmonella species NOT DETECTED NOT DETECTED Final   Serratia marcescens NOT DETECTED NOT DETECTED Final   Haemophilus influenzae NOT DETECTED NOT DETECTED Final   Neisseria meningitidis NOT DETECTED NOT DETECTED Final   Pseudomonas aeruginosa NOT DETECTED NOT DETECTED Final   Stenotrophomonas maltophilia NOT DETECTED NOT DETECTED Final   Candida albicans NOT DETECTED NOT DETECTED Final   Candida auris NOT DETECTED NOT DETECTED Final   Candida glabrata NOT DETECTED NOT DETECTED Final   Candida krusei NOT DETECTED NOT DETECTED Final   Candida parapsilosis NOT DETECTED NOT DETECTED Final   Candida tropicalis NOT DETECTED NOT DETECTED Final   Cryptococcus neoformans/gattii NOT DETECTED NOT DETECTED Final    Comment: Performed at [facility], [address], [place], [place] [address]  CSF culture w Gram Stain     Status: None   Collection Time: 05/27/23  4:10 PM   Specimen: CSF  Result Value Ref Range Status   Specimen Description   Final     CSF LP Performed at [facility], [address] [place], [place] [address]    Special Requests   Final    Immunocompromised Performed at [facility], [address] [place], [place] [address]    Gram Stain   Final    WBC PRESENT, PREDOMINANTLY MONONUCLEAR NO ORGANISMS SEEN CYTOSPIN SMEAR Gram Stain Report Called to,Read Back By and Verified With: Cloria Spring RN Performed at [facility], [address], [place], [place] [address]    Culture   Final    NO GROWTH 3 DAYS Performed at [facility], [address] [place], [place] [address]    Report Status 05/31/2023 FINAL  Final  Culture, blood (Routine X 2) w Reflex to ID Panel     Status: None   Collection Time: 05/31/23  6:11 PM   Specimen: BLOOD  Result Value Ref Range Status   Specimen Description   Final    BLOOD BLOOD LEFT HAND Performed at [facility] [address], [place], [place] [address]    Special Requests   Final    BOTTLES DRAWN AEROBIC ONLY Blood Culture results may not be optimal due to an inadequate volume of blood received in culture bottles Performed at [facility], [address] [place], [place] [address]    Culture   Final    NO GROWTH 5 DAYS Performed at [facility], [address], [place], [place] [address]    Report Status 06/05/2023 FINAL  Final  Culture, blood (Routine X 2) w Reflex to ID Panel     Status: None   Collection Time: 05/31/23  9:03 PM   Specimen: BLOOD  Result Value Ref Range Status   Specimen Description   Final    BLOOD BLOOD RIGHT HAND Performed at [facility], [address] [place], [place] [address]    Special Requests   Final    BOTTLES DRAWN AEROBIC AND ANAEROBIC Blood Culture results may not be optimal due to an inadequate volume of blood received in culture bottles Performed at [facility], [address],  [place], [place] [address]  Culture   Final    NO GROWTH 5 DAYS Performed at [facility], [address], [place], [place] [address]    Report Status 06/05/2023 FINAL  Final    Labs: CBC: Recent Labs  Lab 06/01/23 0436  WBC 4.3  HGB 11.5*  HCT 37.4*  MCV 84.6  PLT 122*   Basic Metabolic Panel: Recent Labs  Lab 06/01/23 0436  NA 139  K 3.7  CL 112*  CO2 15*  GLUCOSE 82  BUN 24*  CREATININE 1.20  CALCIUM 8.6*   Liver Function Tests: Recent Labs  Lab 06/01/23 0436  AST 36  ALT 38  ALKPHOS 41  BILITOT 0.7  PROT 5.7*  ALBUMIN 2.6*   CBG: Recent Labs  Lab 06/04/23 1221 06/04/23 1547 06/05/23 0631 06/05/23 1122 06/05/23 1644  GLUCAP 125* 88 76 99 106*    Discharge time spent: 31 minutes.  Signed: Debarah Crape, DO [facility] 06/06/2023

## 2023-06-06 NOTE — TOC Transition Note (Signed)
 Transition of Care [facility]) - Discharge Note   Patient Details  Name: Alex Cortez MRN: [id number] Date of Birth: 28-Jun-1950  Transition of Care [facility]) CM/SW Contact:  Alex Foot, RN Phone Number: 06/06/2023, 1:19 PM   Clinical Narrative:    Patient will DC to: [facility] [place]) Anticipated DC date: 06/06/2023 Family notified: Myhre,[name] (Spouse)  Transport by: Sharin Mons   Per MD patient ready for DC to [facility]. RN to call report prior to discharge [phone]). RN, patient, patient's family, and facility notified of DC. DC packet on chart. Ambulance transport requested for patient.  Nurse Cas Manager will sign off for now.  Please consult [place] again if new needs arise.     Final next level of care: Hospice Medical Facility Barriers to Discharge: Barriers Resolved   Patient Goals and CMS Choice Patient states their goals for this hospitalization and ongoing recovery are:: Get better and DC to SNF for rehab CMS Medicare.gov Compare Post Acute Care list provided to:: Patient Represenative (must comment) (Spouse : Maureen Ralphs) Choice offered to / list presented to : Spouse      Discharge Placement                Patient to be transferred to facility by: [facility] Name of family member notified: Alicia,[name] (Spouse) Patient and family notified of of transfer: 06/06/23  Discharge Plan and Services Additional resources added to the After Visit Summary for   In-house Referral: Clinical Social Work   Post Acute Care Choice: Durable Medical Equipment                               Social Drivers of Health (SDOH) Interventions SDOH Screenings   Food Insecurity: Patient Unable To Answer (05/26/2023)  Housing: Low Risk  (05/26/2023)  Transportation Needs: Patient Unable To Answer (05/26/2023)  Utilities: Patient Unable To Answer (05/26/2023)  Depression (PHQ2-9): Low Risk  (04/07/2022)  Financial Resource Strain: Low Risk  (01/03/2023)  Physical  Activity: Inactive (11/22/2022)  Social Connections: Unknown (05/26/2023)  Stress: No Stress Concern Present (04/07/2022)  Tobacco Use: Medium Risk (05/25/2023)  Health Literacy: Adequate Health Literacy (12/06/2022)     Readmission Risk Interventions    05/31/2023   10:09 AM 11/17/2022   10:57 AM 07/14/2022   12:04 PM  Readmission Risk Prevention Plan  Transportation Screening Complete Complete Complete  PCP or Specialist Appt within 5-7 Days Complete    Home Care Screening Complete    Medication Review (RN CM) Complete    HRI or Home Care Consult  Complete Complete  Social Work Consult for Recovery Care Planning/Counseling  Complete Complete  Palliative Care Screening  Not Applicable Not Applicable  Medication Review Oceanographer)  Complete Complete

## 2023-06-06 NOTE — Progress Notes (Signed)
 Wife at bedside requested I hold am meds due to patient sleeping soundly at this time

## 2023-06-06 NOTE — Progress Notes (Signed)
 Gave report to [employer] [phone]

## 2023-06-06 NOTE — Progress Notes (Signed)
 Attempted to insert # 16 foley catheter using sterile technique. Bladder scan obtained 223 ml retained. Patient has penile and scrotal edema, was unsuccessful with foley catheter insertion. Chinita Greenland, NP notified. No new orders were given at this time.

## 2023-06-07 ENCOUNTER — Telehealth: Payer: Self-pay | Admitting: Hematology and Oncology

## 2023-06-07 ENCOUNTER — Ambulatory Visit: Payer: Medicare Other | Admitting: Urology

## 2023-06-07 NOTE — Telephone Encounter (Signed)
 Hospice

## 2023-06-13 DIAGNOSIS — I10 Essential (primary) hypertension: Secondary | ICD-10-CM | POA: Diagnosis not present

## 2023-06-13 DIAGNOSIS — E1122 Type 2 diabetes mellitus with diabetic chronic kidney disease: Secondary | ICD-10-CM | POA: Diagnosis not present

## 2023-06-13 DIAGNOSIS — I700 Atherosclerosis of aorta: Secondary | ICD-10-CM | POA: Diagnosis not present

## 2023-06-14 DEATH — deceased

## 2023-07-08 ENCOUNTER — Ambulatory Visit: Payer: Medicare Other | Admitting: Hematology and Oncology

## 2023-07-08 ENCOUNTER — Other Ambulatory Visit: Payer: Medicare Other

## 2023-07-13 DIAGNOSIS — I700 Atherosclerosis of aorta: Secondary | ICD-10-CM | POA: Diagnosis not present

## 2023-07-13 DIAGNOSIS — I10 Essential (primary) hypertension: Secondary | ICD-10-CM | POA: Diagnosis not present

## 2023-07-13 DIAGNOSIS — E1122 Type 2 diabetes mellitus with diabetic chronic kidney disease: Secondary | ICD-10-CM | POA: Diagnosis not present

## 2023-08-23 ENCOUNTER — Ambulatory Visit: Payer: Medicare Other | Admitting: "Endocrinology
